# Patient Record
Sex: Female | Born: 1935 | Race: White | Hispanic: No
Health system: Southern US, Community
[De-identification: ages and names within clinical notes are randomized; demographics above are authoritative.]

## PROBLEM LIST (undated history)

## (undated) DIAGNOSIS — R4189 Other symptoms and signs involving cognitive functions and awareness: Secondary | ICD-10-CM

## (undated) DIAGNOSIS — F0390 Unspecified dementia without behavioral disturbance: Secondary | ICD-10-CM

## (undated) DIAGNOSIS — F32A Depression, unspecified: Secondary | ICD-10-CM

## (undated) DIAGNOSIS — E039 Hypothyroidism, unspecified: Secondary | ICD-10-CM

## (undated) DIAGNOSIS — G629 Polyneuropathy, unspecified: Secondary | ICD-10-CM

## (undated) DIAGNOSIS — I10 Essential (primary) hypertension: Secondary | ICD-10-CM

## (undated) DIAGNOSIS — M109 Gout, unspecified: Secondary | ICD-10-CM

## (undated) DIAGNOSIS — F329 Major depressive disorder, single episode, unspecified: Secondary | ICD-10-CM

## (undated) HISTORY — DX: Other symptoms and signs involving cognitive functions and awareness: R41.89

## (undated) HISTORY — DX: Polyneuropathy, unspecified: G62.9

## (undated) HISTORY — DX: Essential (primary) hypertension: I10

## (undated) HISTORY — DX: Depression, unspecified: F32.A

## (undated) HISTORY — DX: Hypothyroidism, unspecified: E03.9

## (undated) HISTORY — DX: Gout, unspecified: M10.9

---
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
## 2003-04-07 ENCOUNTER — Ambulatory Visit (HOSPITAL_COMMUNITY): Admission: RE | Admit: 2003-04-07 | Discharge: 2003-04-07 | Payer: Self-pay | Admitting: *Deleted

## 2006-03-30 ENCOUNTER — Encounter: Admission: RE | Admit: 2006-03-30 | Discharge: 2006-03-30 | Payer: Self-pay | Admitting: Urology

## 2006-03-30 IMAGING — CR DG CHEST 2V
2 series · 2 of 2 positions shown · non-contrast
Comparison: none

CLINICAL DATA: Pre-op for bladder tumor. 
 CHEST X-RAY: 
 Two views of the chest show the lungs to be clear.  The heart is within normal limits in size.  No bony abnormality is seen.

[w chest pa]
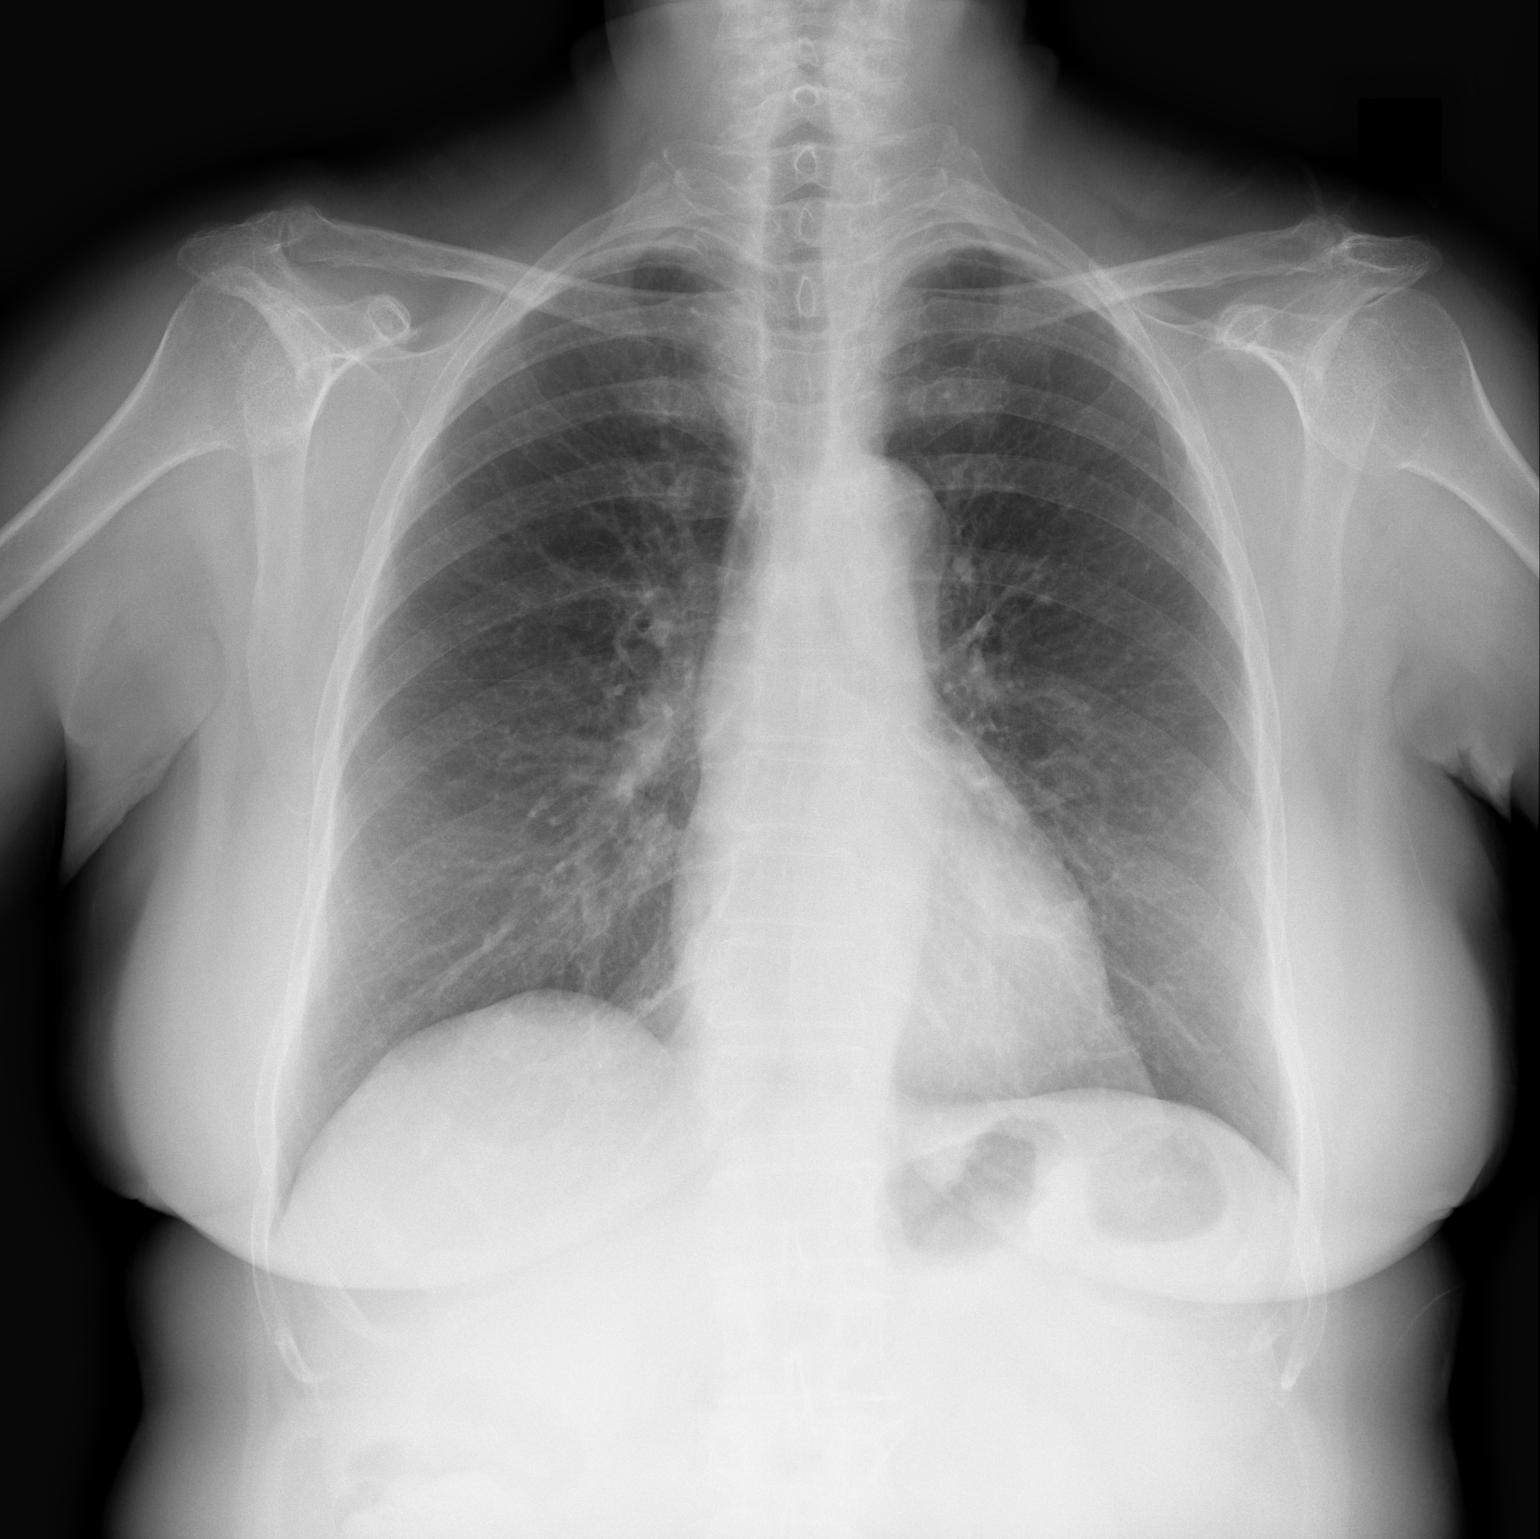

[w chest lat]
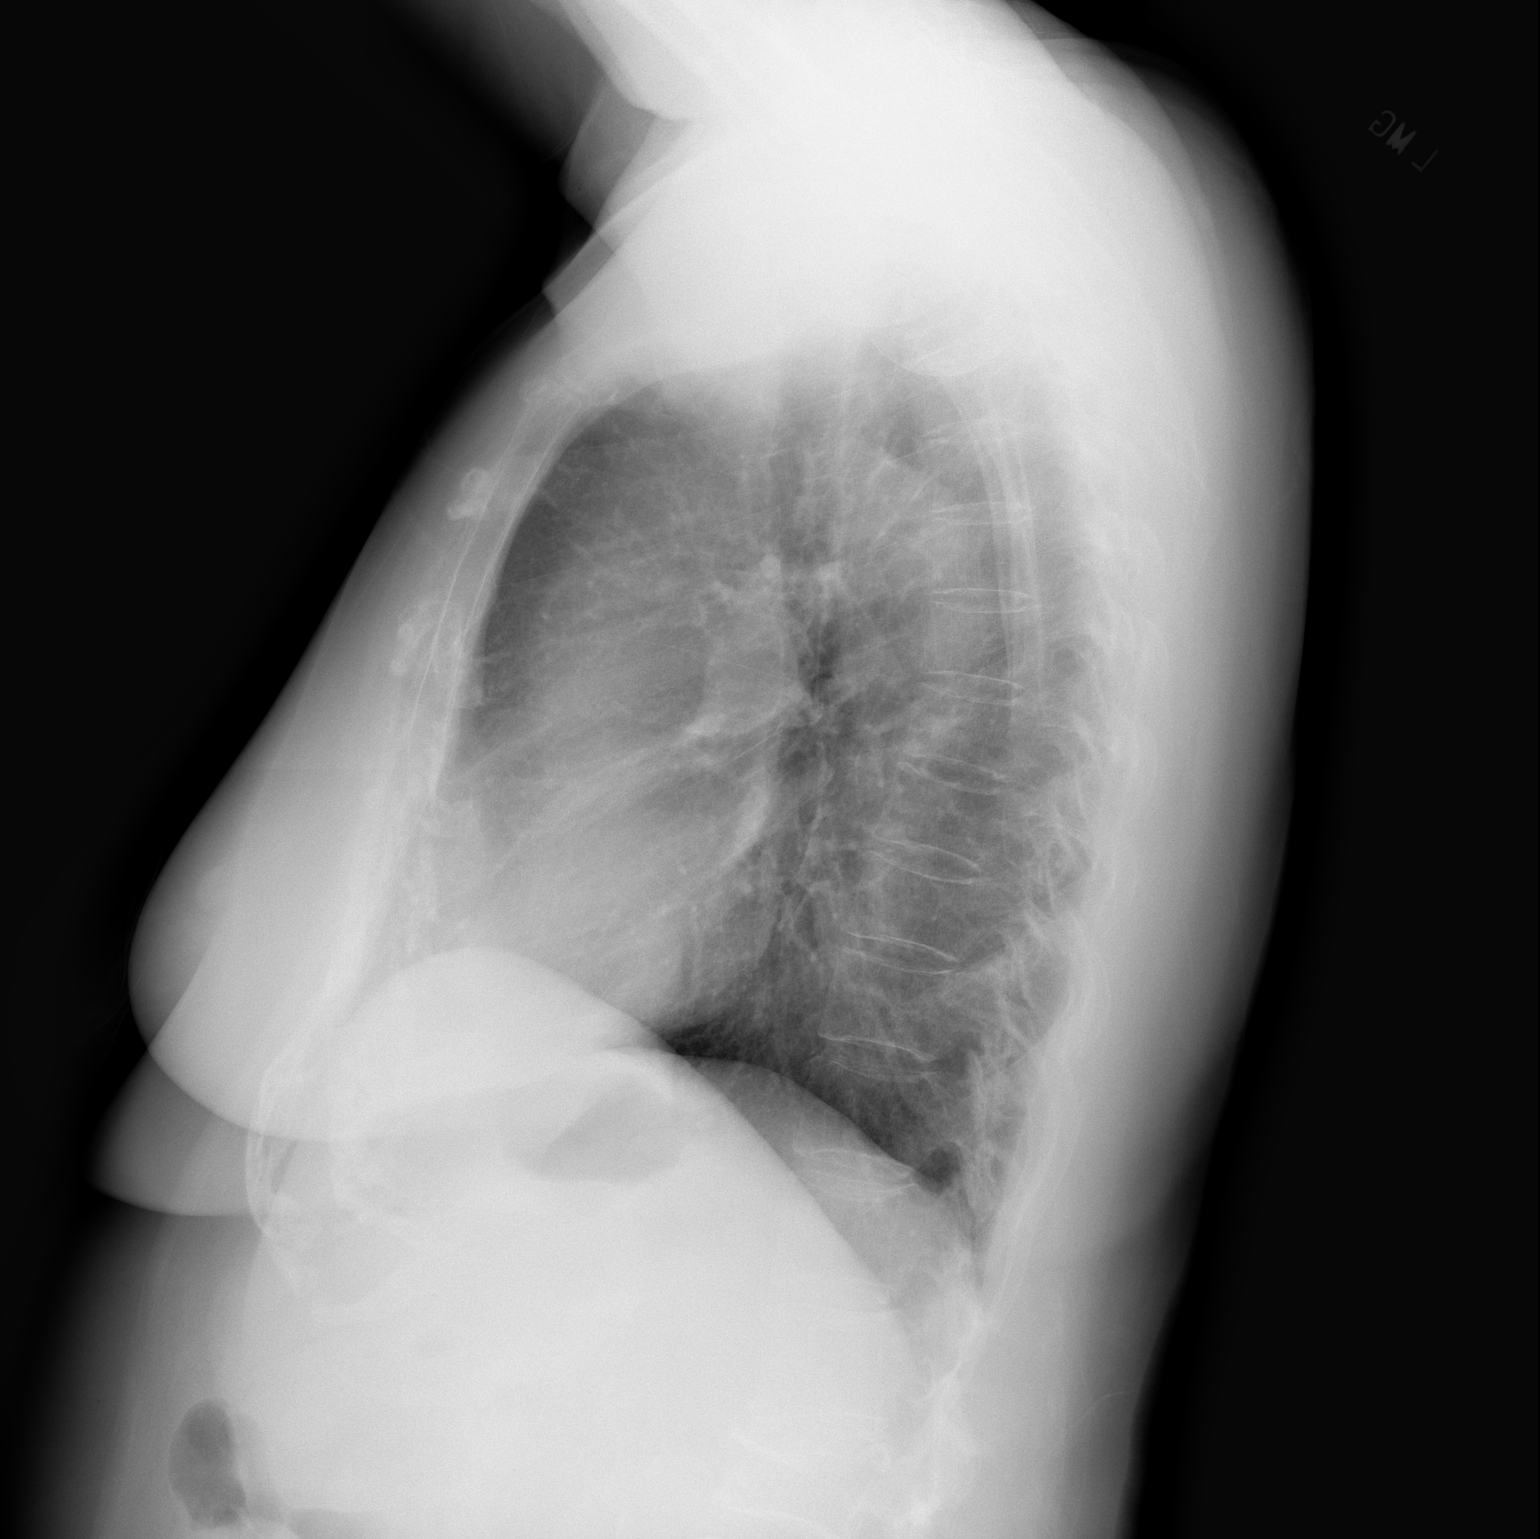

[2 of 2 positions shown; findings below may reference images not displayed]

IMPRESSION: No active lung disease.

## 2006-03-31 ENCOUNTER — Ambulatory Visit (HOSPITAL_BASED_OUTPATIENT_CLINIC_OR_DEPARTMENT_OTHER): Admission: RE | Admit: 2006-03-31 | Discharge: 2006-03-31 | Payer: Self-pay | Admitting: Urology

## 2006-03-31 ENCOUNTER — Encounter (INDEPENDENT_AMBULATORY_CARE_PROVIDER_SITE_OTHER): Payer: Self-pay | Admitting: Specialist

## 2008-06-07 ENCOUNTER — Inpatient Hospital Stay (HOSPITAL_COMMUNITY): Admission: EM | Admit: 2008-06-07 | Discharge: 2008-06-08 | Payer: Self-pay | Admitting: Emergency Medicine

## 2008-06-07 IMAGING — CR DG CHEST 1V PORT
1 series · 1 of 1 positions shown · non-contrast
Comparison: [DATE]

CLINICAL DATA: Chest pain.

PORTABLE CHEST - 1 VIEW

[view not recorded]
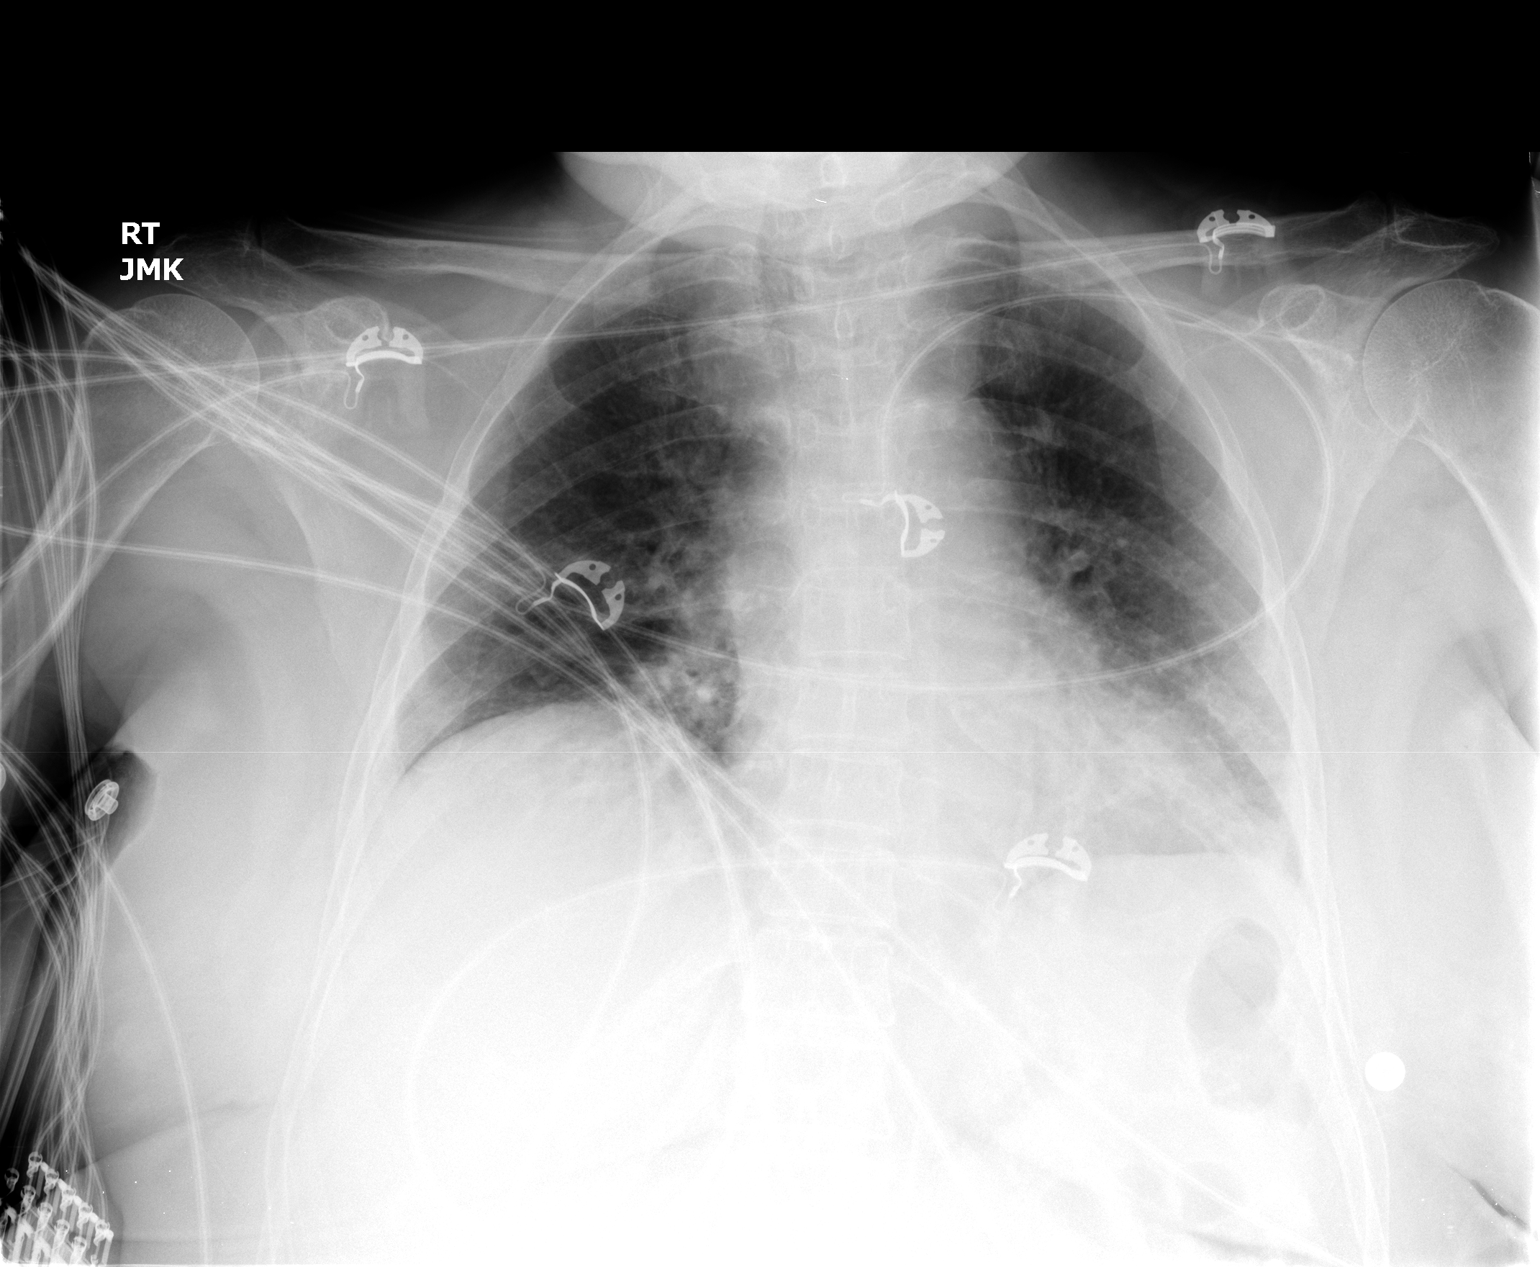

[1 of 1 positions shown; findings below may reference images not displayed]

FINDINGS: Heart size normal.  No CHF or acute pulmonary process.
Bronchitic markings may be accentuated possibly due to chronic
bronchitic disease.
IMPRESSION: Probable chronic bronchitic changes.  No acute findings.

## 2008-06-07 IMAGING — CT CT ANGIO CHEST
2 of 7 series · 19 of 36 positions shown · IV contrast (APPLIED)
Comparison: Chest radiograph today.

CLINICAL DATA: Chest pain.  Query PE.  Elevated D-dimer.
Hypertension.

CT ANGIOGRAPHY CHEST
TECHNIQUE: Multidetector CT imaging of the chest was performed
using the standard protocol during bolus administration of
intravenous contrast. Multiplanar CT image reconstructions
including MIPs were obtained to evaluate the vascular anatomy.
Contrast: 100 ml [HR] IV

[Series 8: pulm embolism 1.0 b25f thins · axial · 0.59mm/px · z∈[+566,+742]mm · 18 of 199 slices shown]
[im 11/199  lung]
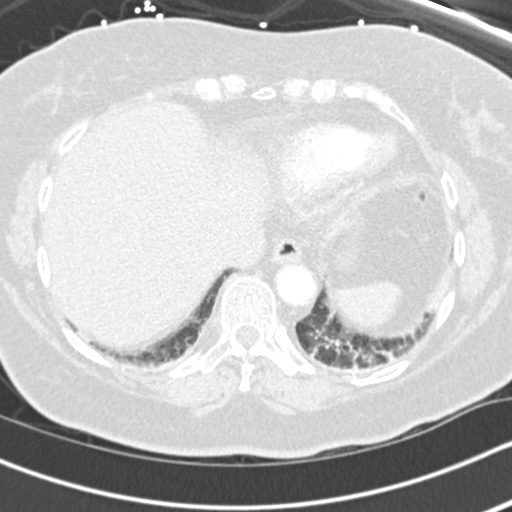
[im 21/199  mediastinal]
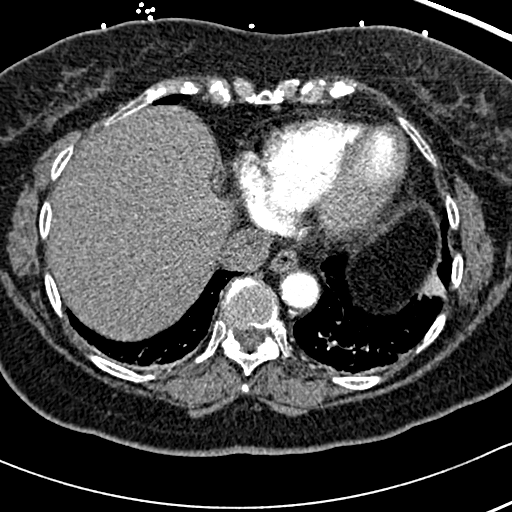
[im 32/199  lung]
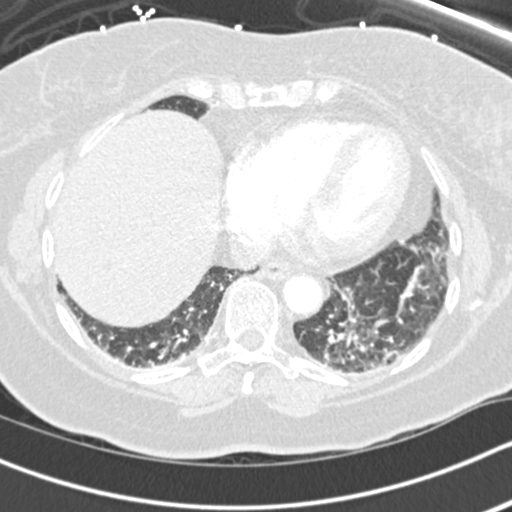
[im 42/199  mediastinal]
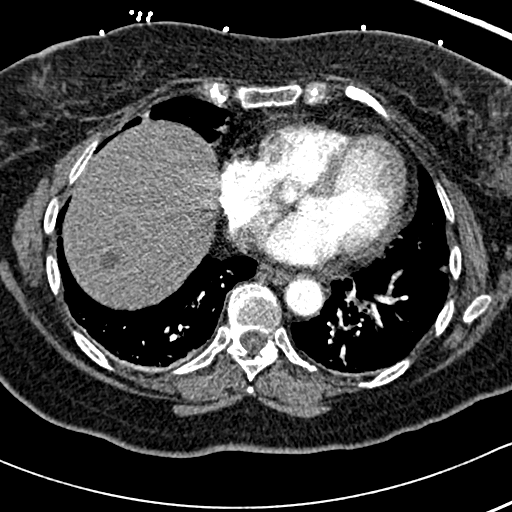
[im 53/199  lung]
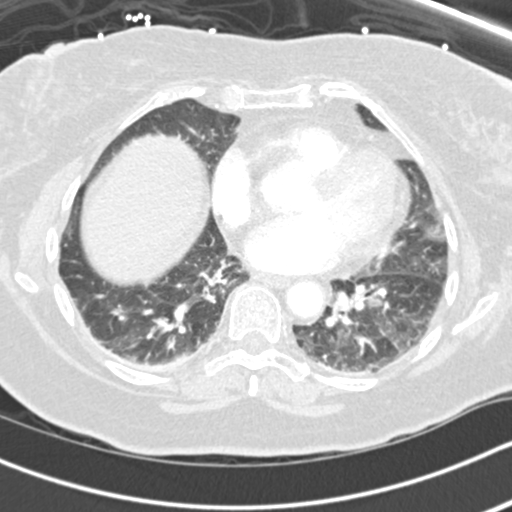
[im 63/199  mediastinal]
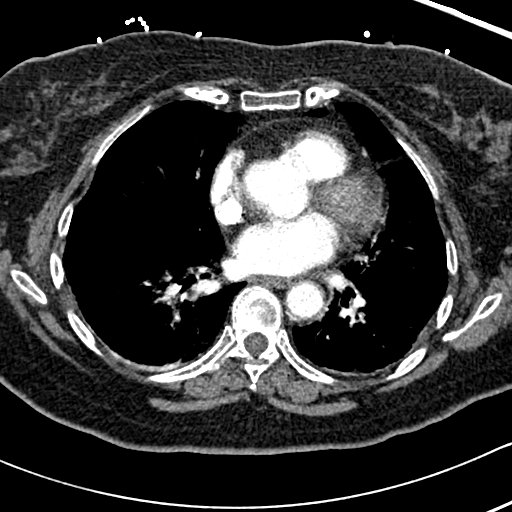
[im 73/199  lung]
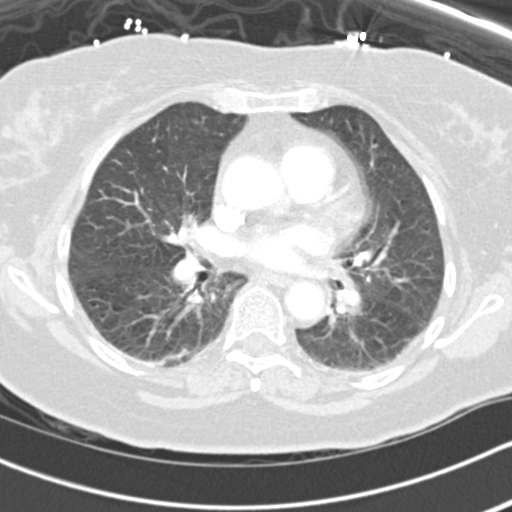
[im 84/199  mediastinal]
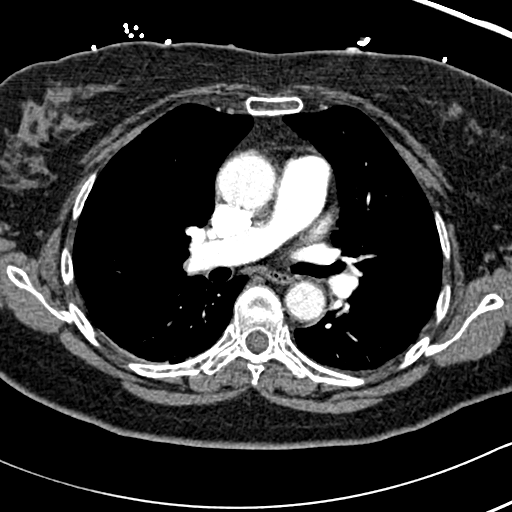
[im 94/199  lung]
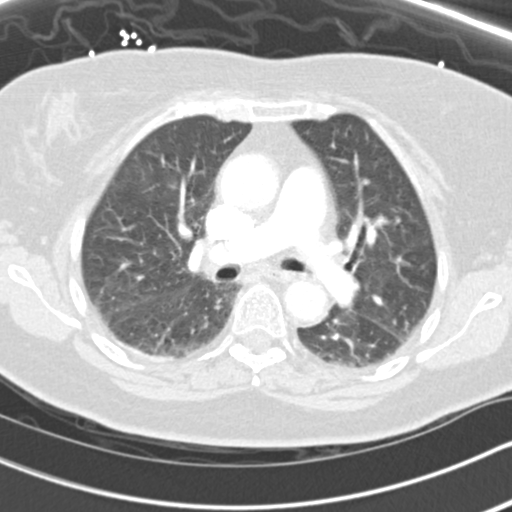
[im 105/199  mediastinal]
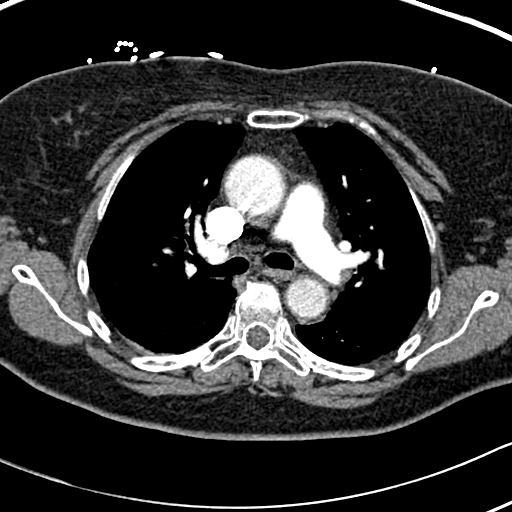
[im 115/199  lung]
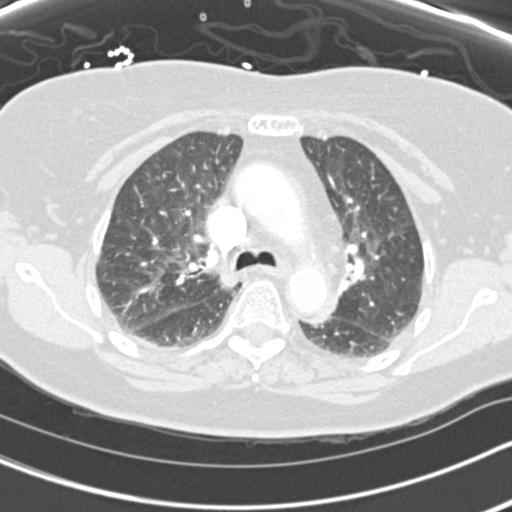
[im 126/199  mediastinal]
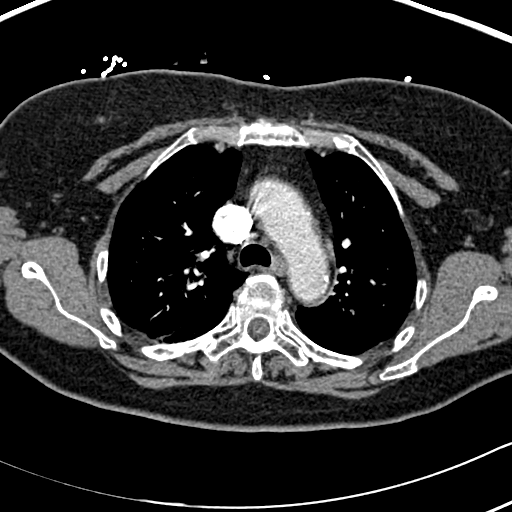
[im 136/199  lung]
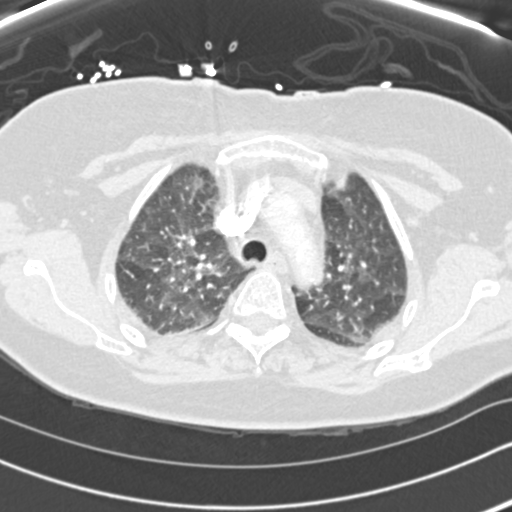
[im 146/199  mediastinal]
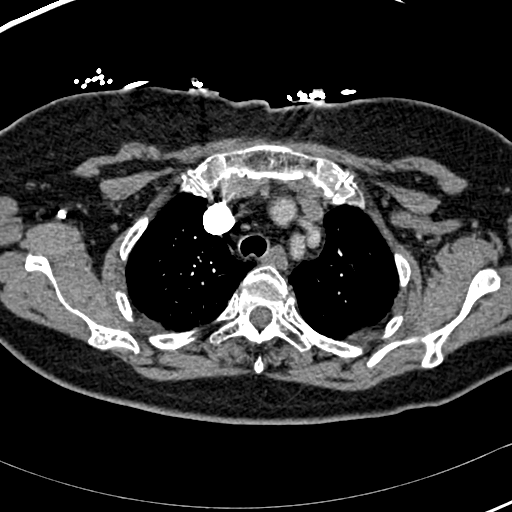
[im 157/199  lung]
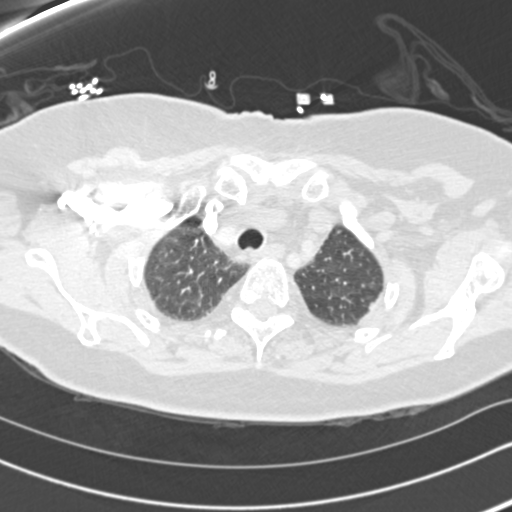
[im 167/199  mediastinal]
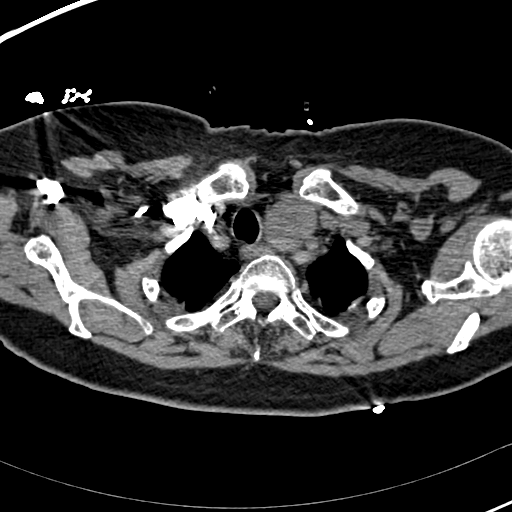
[im 178/199  lung]
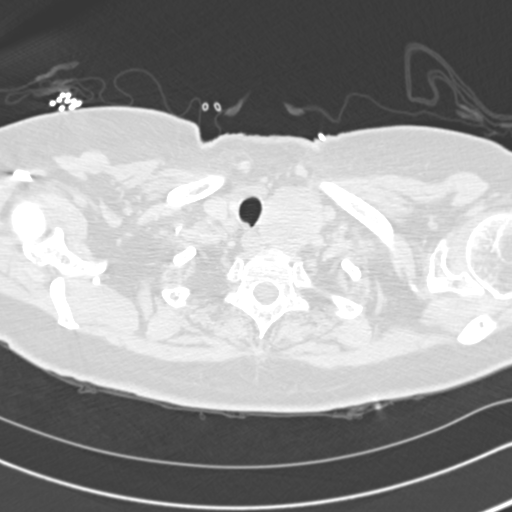
[im 188/199  mediastinal]
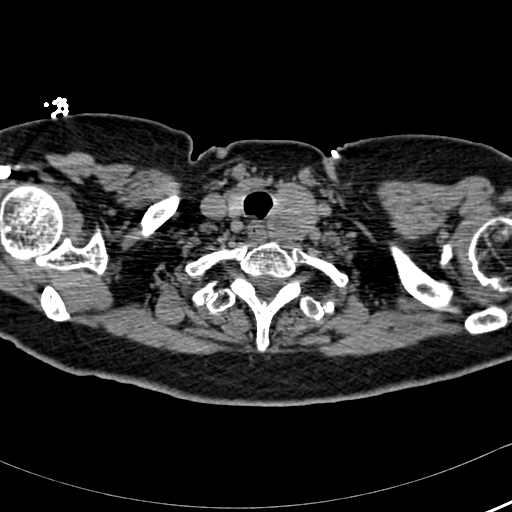

[Series 602: coronal · coronal · 0.59mm/px · 1 of 56 slices shown]
[im 28/56  mediastinal]
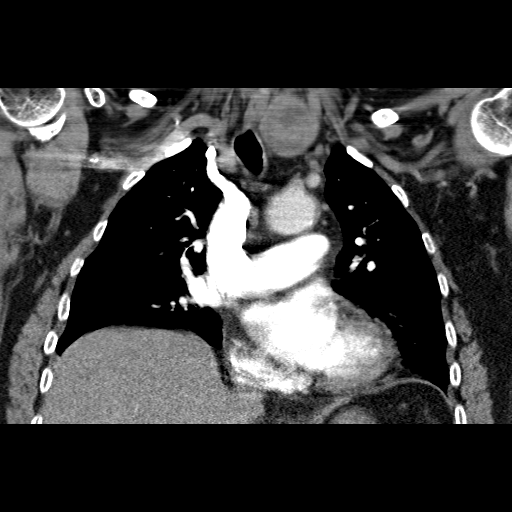

[19 of 36 positions shown; findings below may reference images not displayed]

FINDINGS: There is a 3.5 x 3.6 cm solid mass which arises from the
inferior pole of the left thyroid lobe and extends into the
superior mediastinum displacing the trachea to the right.  This is
compatible with a large thyroid mass.  Thyroid ultrasound
recommended for further evaluation.  Mass may need to be biopsied.

Negative for PE.  No acute mediastinal abnormality.  Chronic
bronchitic changes.  No pericardial or pleural effusions.  Moderate
prominent caliber of the central pulmonary arteries may reflect an
element of PAH.  Mild hazy ground-glass attenuation of the lungs
having somewhat of a mosaic pattern consistent with small airways
disease.

 Review of the MIP images confirms the above findings.
IMPRESSION: Left thyroid mass - recommend thyroid ultrasound.  Negative for PE.
Mosaic attenuation of the lungs compatible with a small airways
disease.  No acute infiltrate.  Prominent pulmonary arteries may
reflect PAH.  Chronic bronchitic changes.

## 2008-06-08 ENCOUNTER — Encounter (INDEPENDENT_AMBULATORY_CARE_PROVIDER_SITE_OTHER): Payer: Self-pay | Admitting: Internal Medicine

## 2008-06-08 IMAGING — US US SOFT TISSUE HEAD/NECK
1 series · 14 of 25 positions shown · non-contrast
Comparison: CT [DATE]

CLINICAL DATA: Chest pain.  Thyroid mass identified on recent CT
chest

THYROID ULTRASOUND
TECHNIQUE: Ultrasound examination of the thyroid gland and
adjacent soft tissues was performed.

[Series 1: us soft tissue head/neck · 0.10mm/px · 14 of 37 slices shown]
[im 1/37]
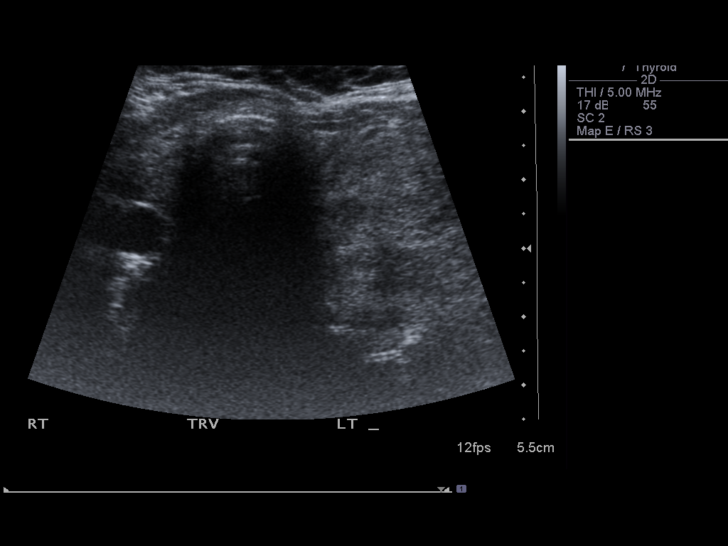
[im 4/37]
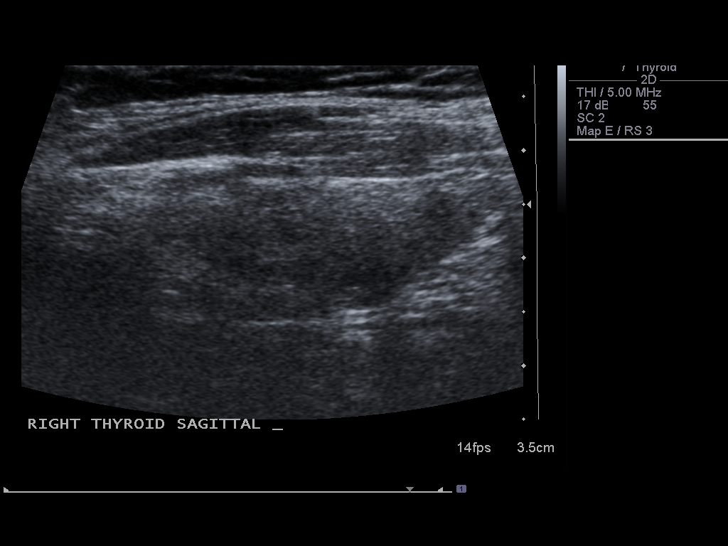
[im 7/37]
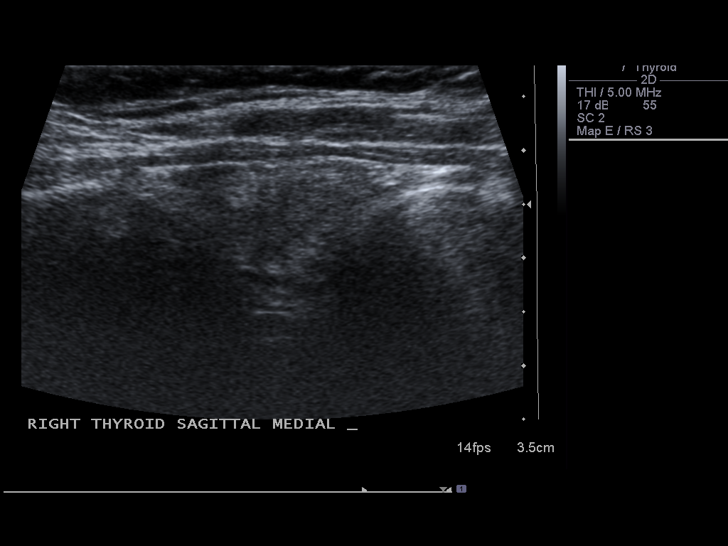
[im 10/37]
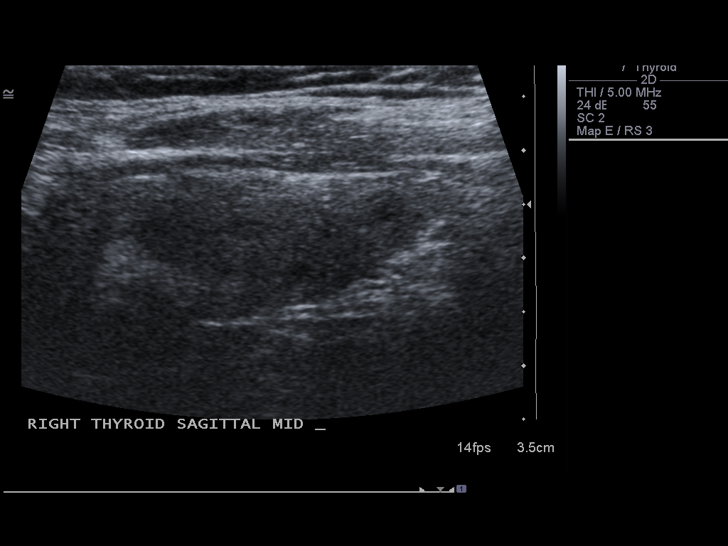
[im 13/37]
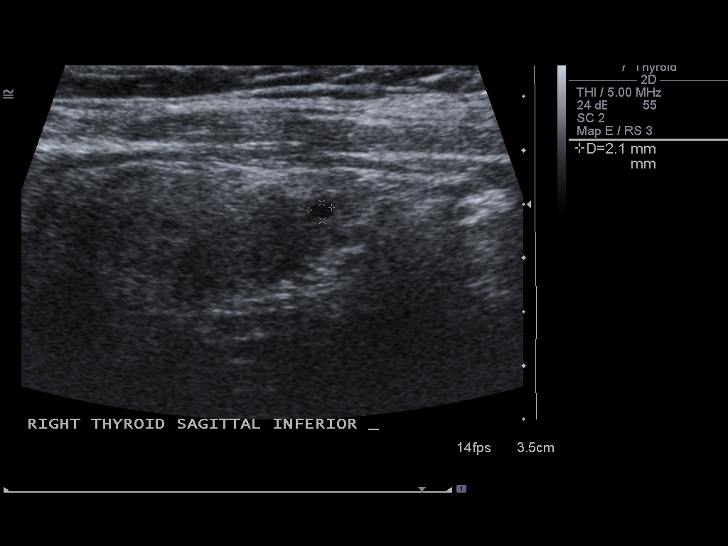
[im 14/37]
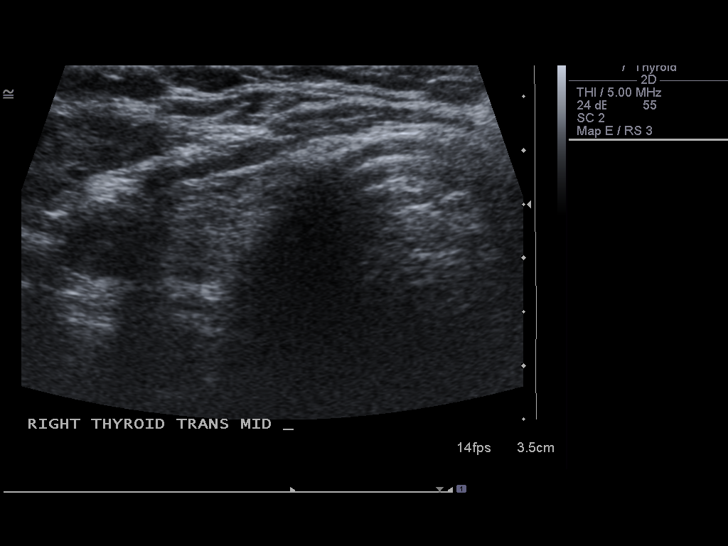
[im 17/37]
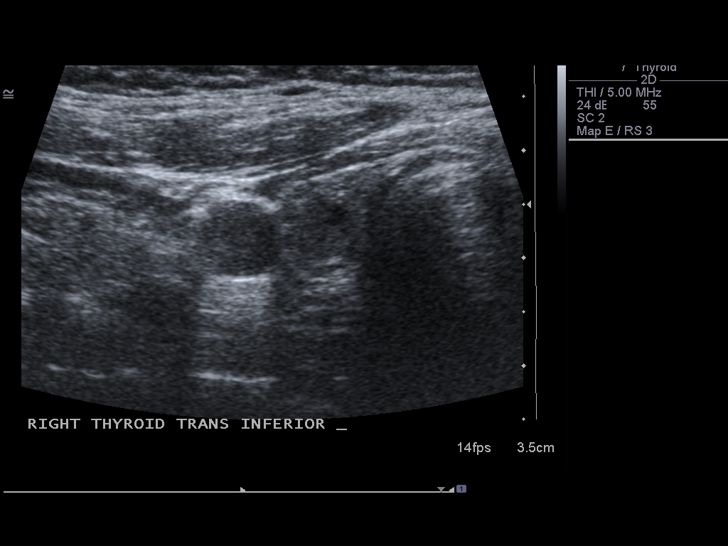
[im 20/37]
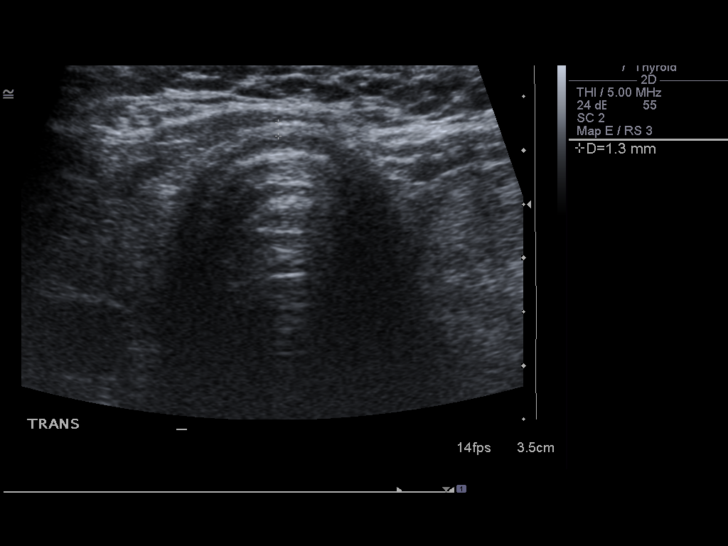
[im 23/37]
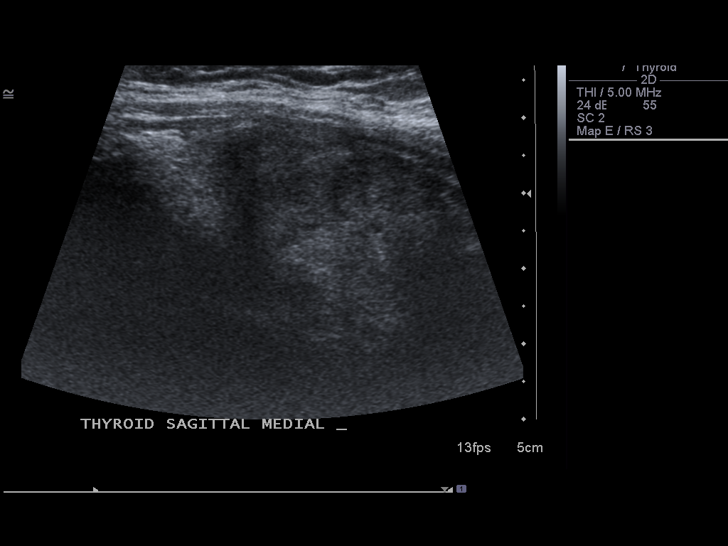
[im 25/37]
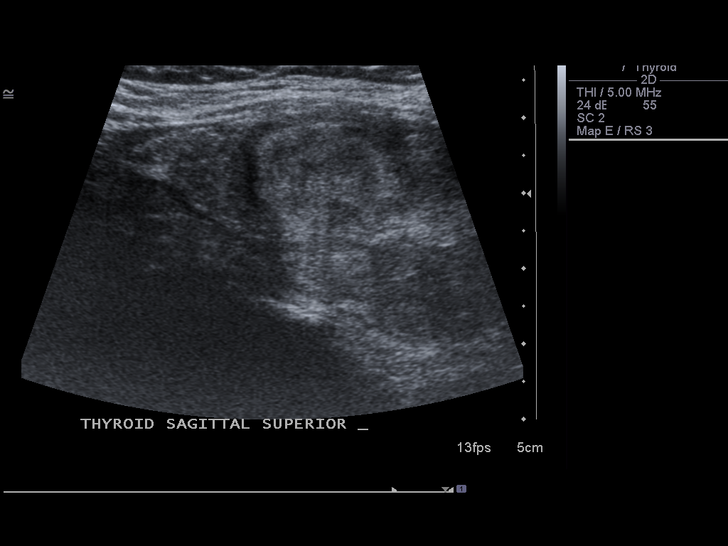
[im 28/37]
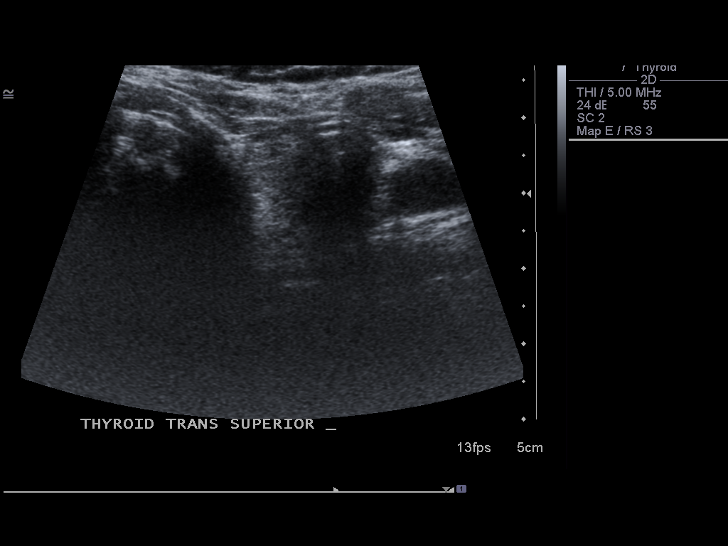
[im 31/37]
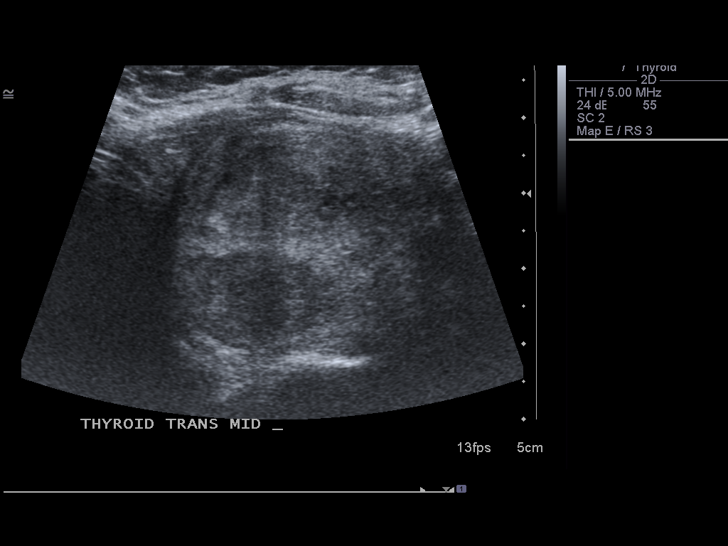
[im 34/37]
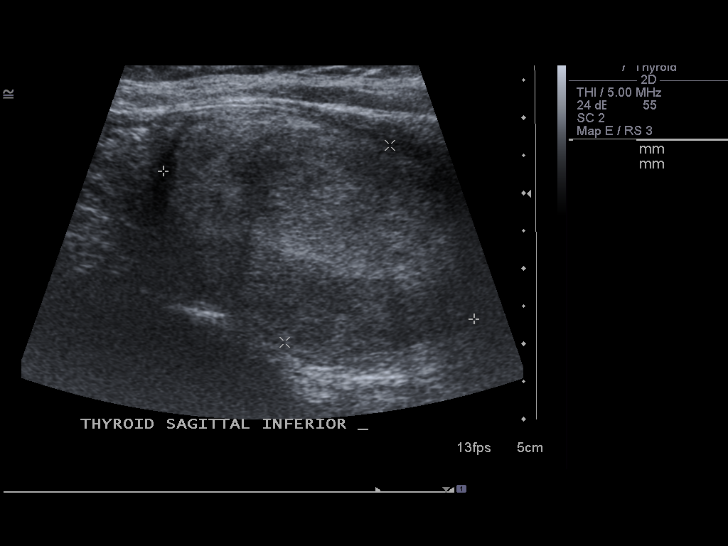
[im 37/37]
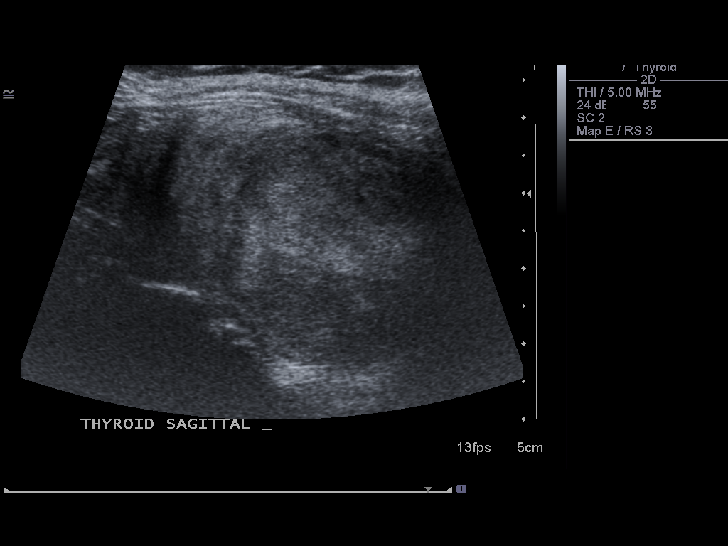

[14 of 25 positions shown; findings below may reference images not displayed]

FINDINGS: The right lobe measures 11 x 11 x 34 mm.  There is a 2 mm
cyst in the inferior pole.  The isthmus measures only 1 mm in
thickness.
The left lobe measures 30 x 37 x 55 mm.  There is a dominant solid
30 x 33 x 46 mm nodule in the inferior pole.
IMPRESSION: 1.  Solitary dominant left thyroid mass.  Consider aspiration
biopsy to exclude neoplasm.

## 2008-06-21 ENCOUNTER — Encounter: Admission: RE | Admit: 2008-06-21 | Discharge: 2008-06-21 | Payer: Self-pay | Admitting: Internal Medicine

## 2008-06-21 ENCOUNTER — Other Ambulatory Visit: Admission: RE | Admit: 2008-06-21 | Discharge: 2008-06-21 | Payer: Self-pay | Admitting: Interventional Radiology

## 2008-06-21 ENCOUNTER — Encounter (INDEPENDENT_AMBULATORY_CARE_PROVIDER_SITE_OTHER): Payer: Self-pay | Admitting: Interventional Radiology

## 2008-06-21 IMAGING — US US BIOPSY
1 series · 9 of 9 positions shown · non-contrast
Comparison: none

Clinical Data/Indication: Dominant left thyroid mass

ULTRASOUND-GUIDED BIOPSYOF A DOMINANT LEFT THYROID MASS.  FINE
NEEDLE ASPIRATION.
Procedure: The procedure, risks, benefits, and alternatives were
explained to the patient. Questions regarding the procedure were
encouraged and answered. The patient understands and consents to
the procedure.
The neck was prepped withbetadine in a sterile fasion, and a
sterile drape was applied covering the operative field. A sterile
gown and sterile gloves were used for the procedure.
Under sonographic guidance, three 25 gauge fine needle aspirates of
the dominant left thyroid mass were obtained. The guide needle was
removed. Final imaging was performed.

[Series 1: us biopsy · 0.10mm/px · 9 acquisitions, 9 frames shown]
[im 1/9]
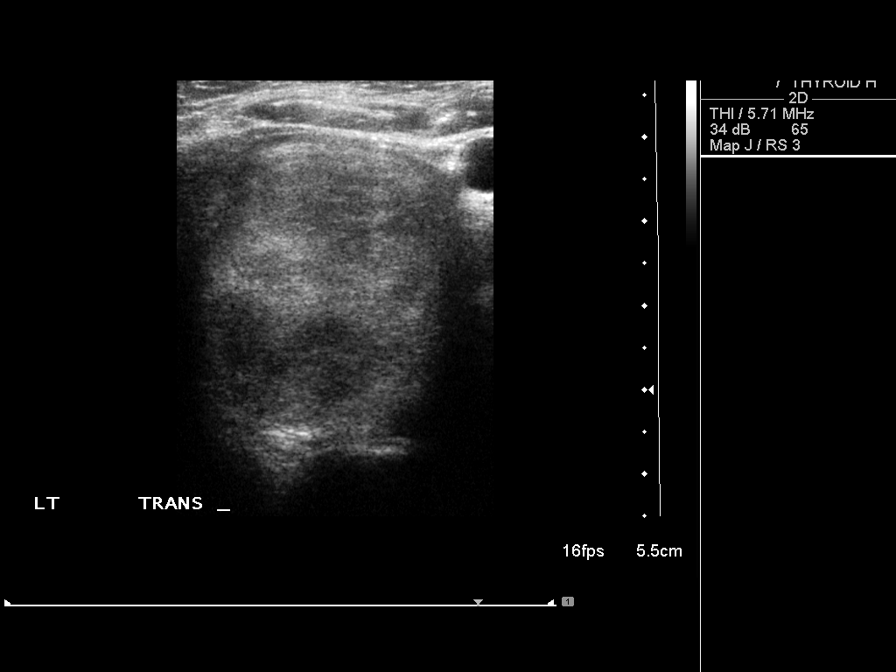
[im 2/9]
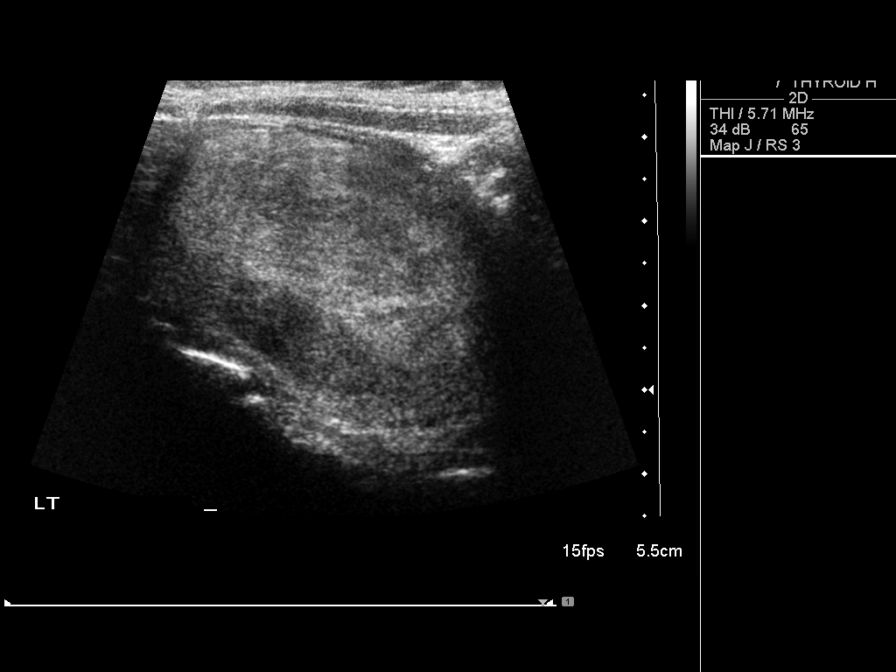
[im 3/9]
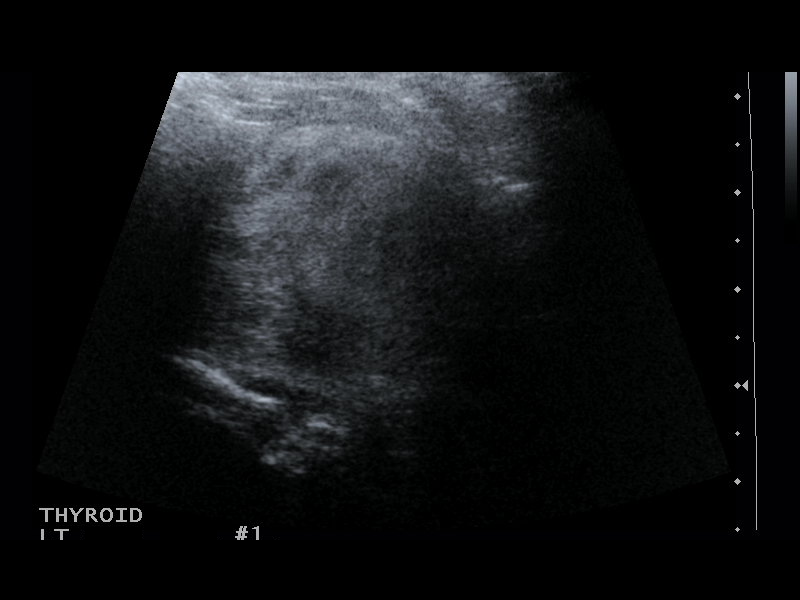
[im 4/9]
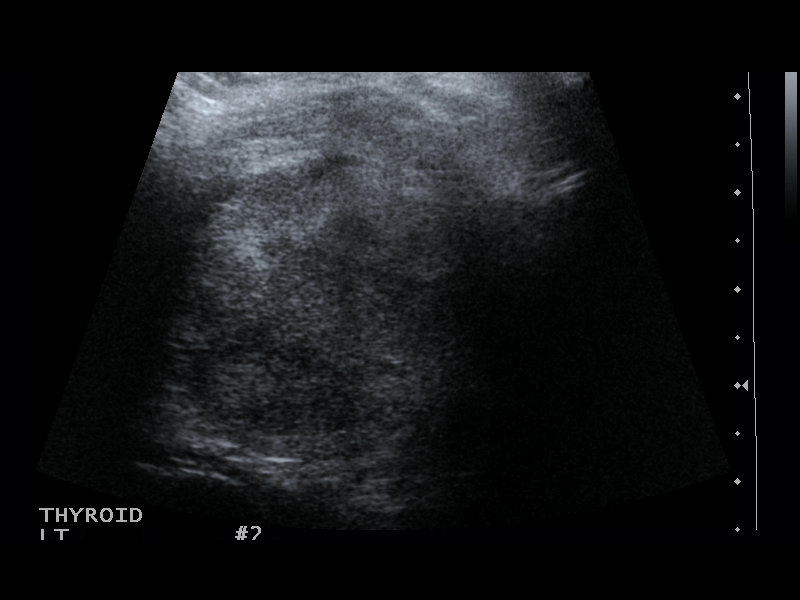
[im 5/9]
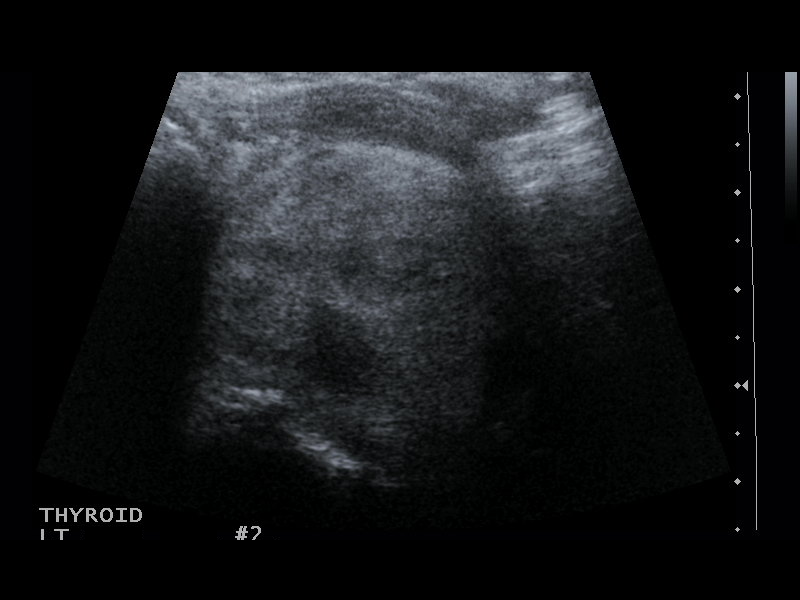
[im 6/9]
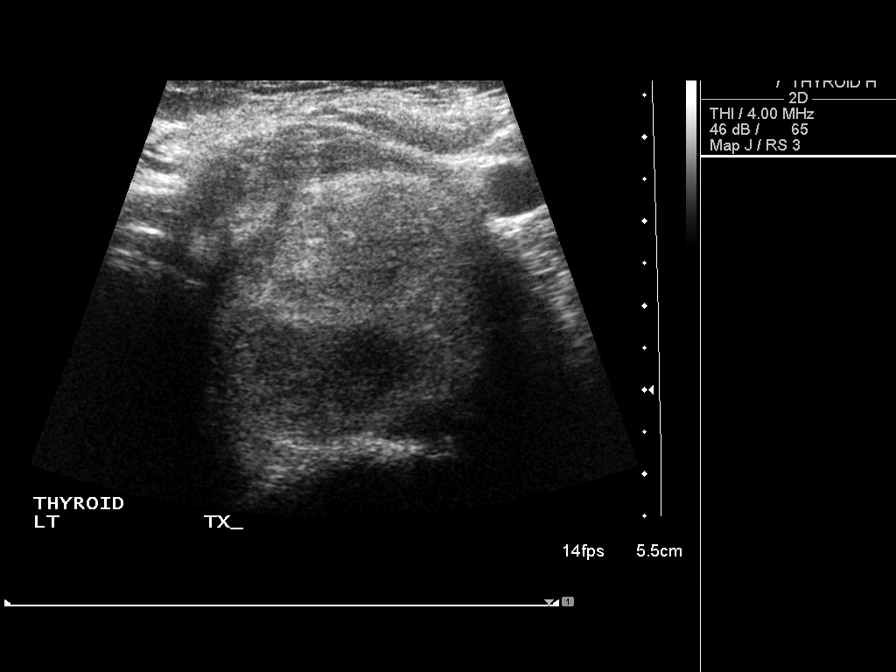
[im 7/9]
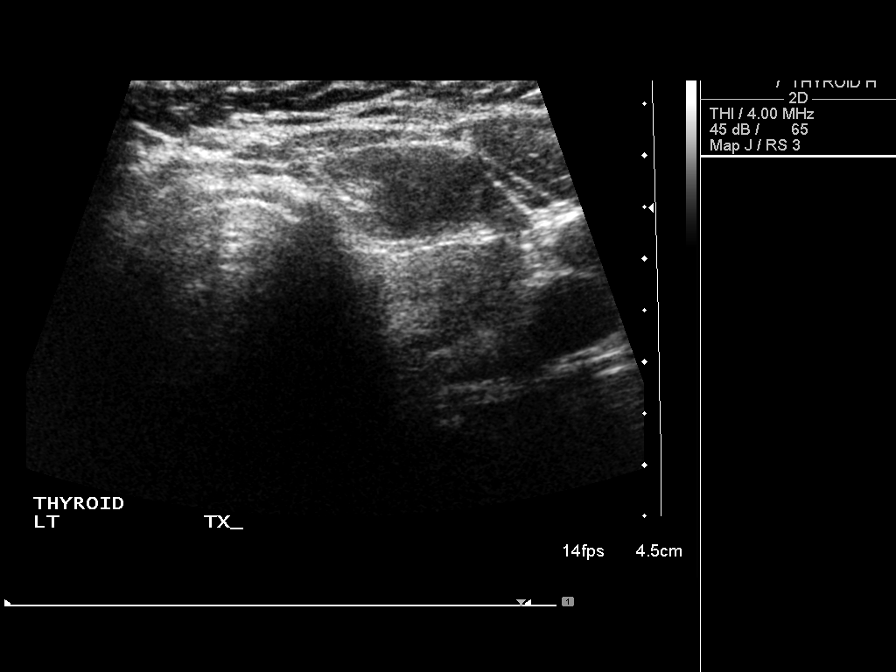
[im 8/9]
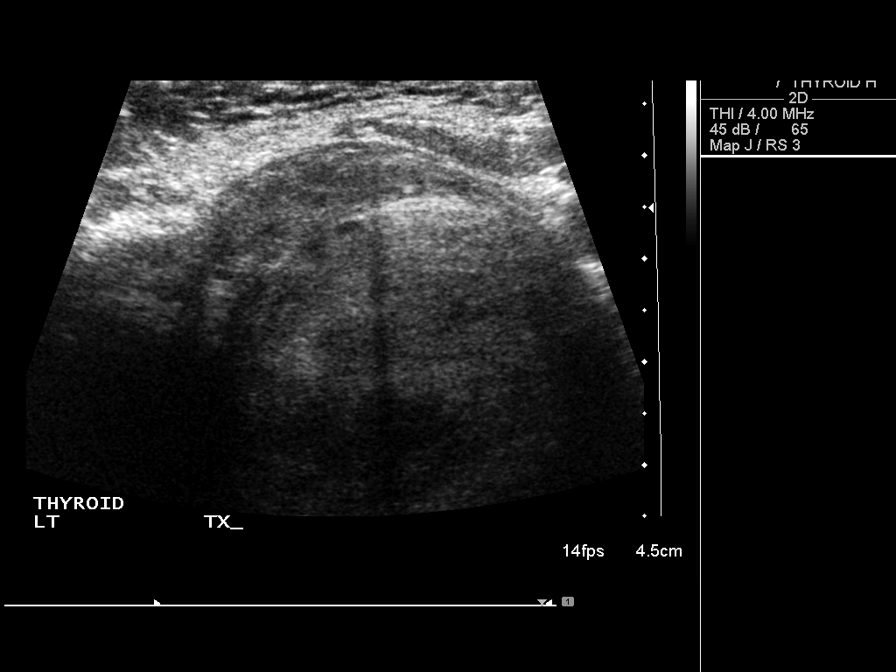
[im 9/9]
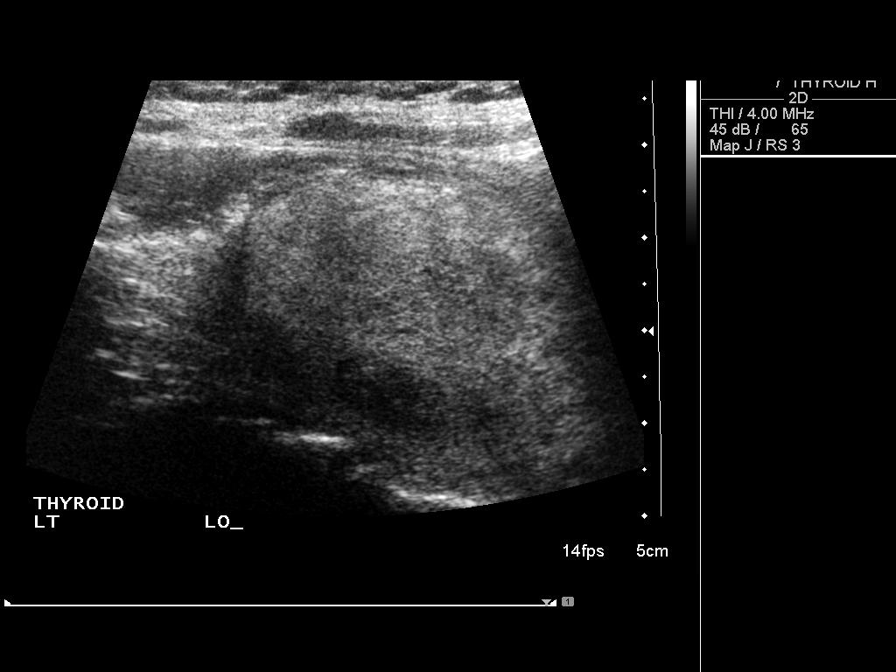

[9 of 9 positions shown; findings below may reference images not displayed]

FINDINGS: The images document guide needle placement within the
left thyroid mass. Post biopsy images demonstrate no hemorrhage.
IMPRESSION: Successful ultrasound-guided fine needle aspiration of a dominant
left thyroid mass.

## 2008-07-13 IMAGING — CR DG CHEST 2V
2 series · 2 of 2 positions shown · non-contrast
Comparison: [DATE]

CLINICAL DATA: Preoperative respiratory exam for thyroid surgery.
Hypertension.

CHEST - 2 VIEW

[view not recorded (1 of 2)]
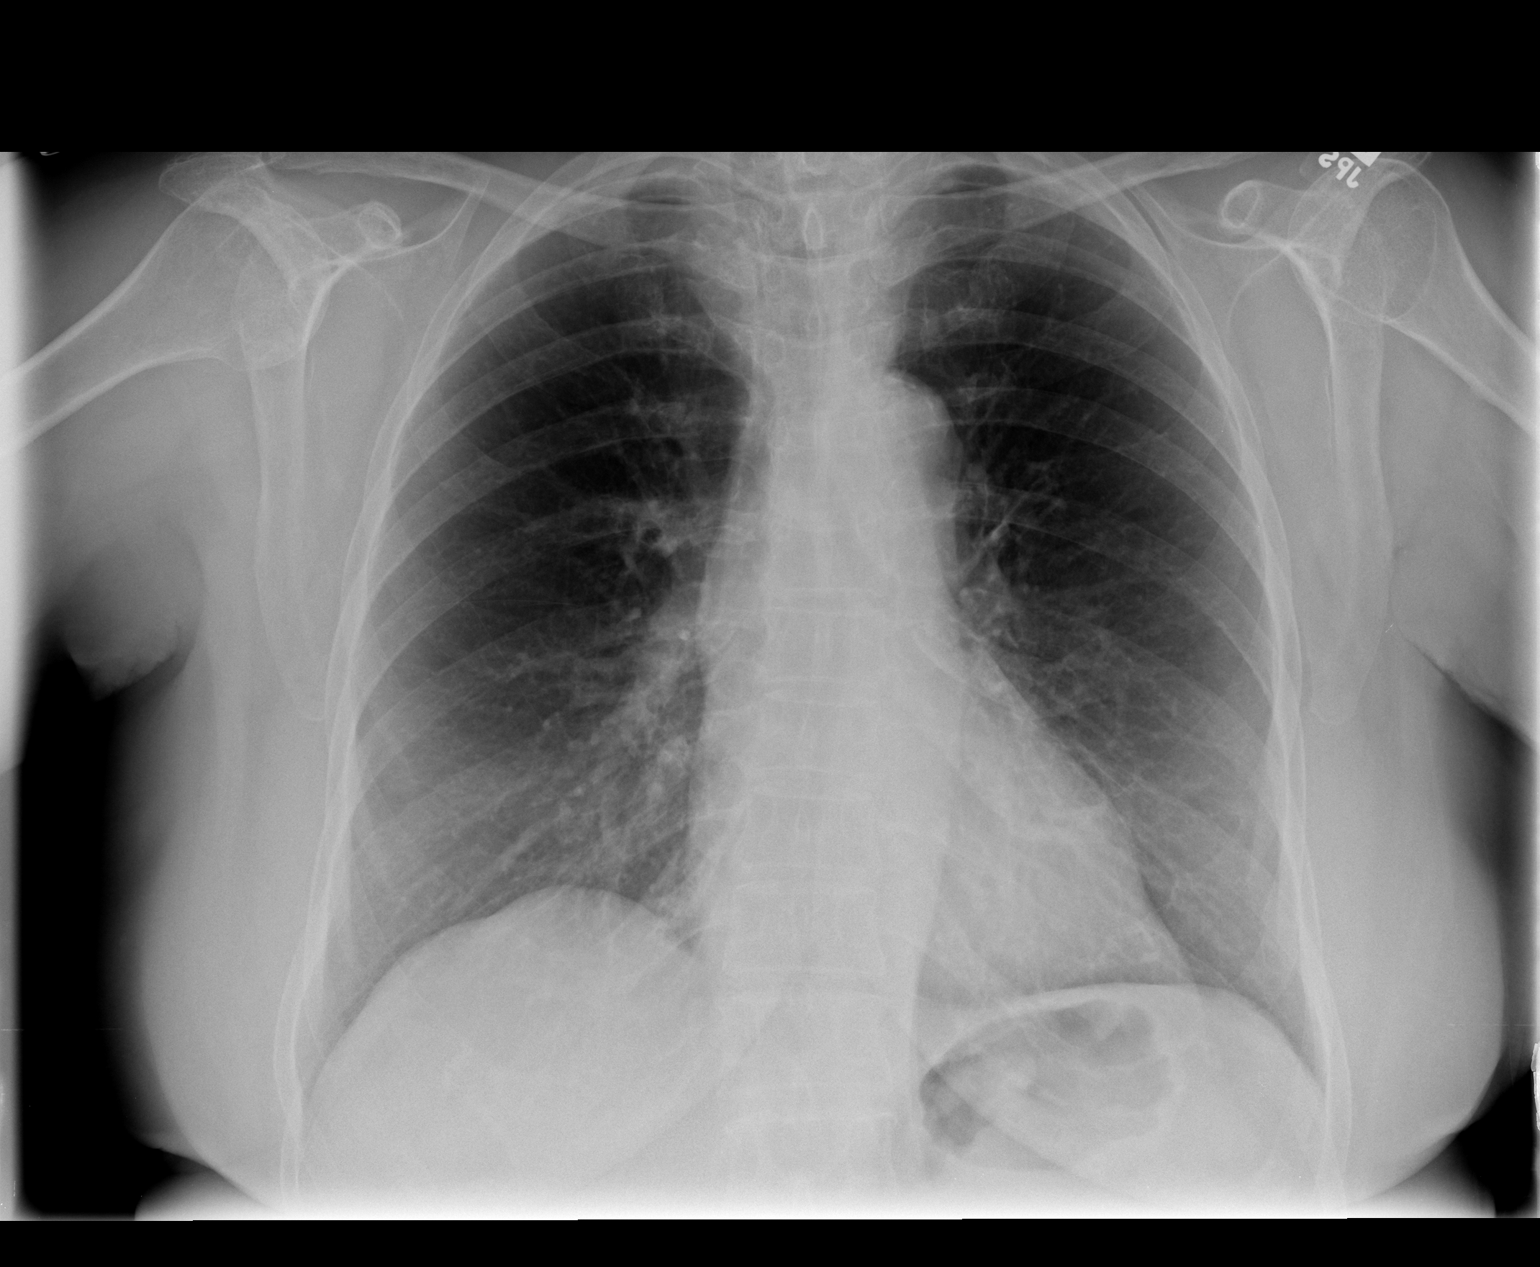

[view not recorded (2 of 2)]
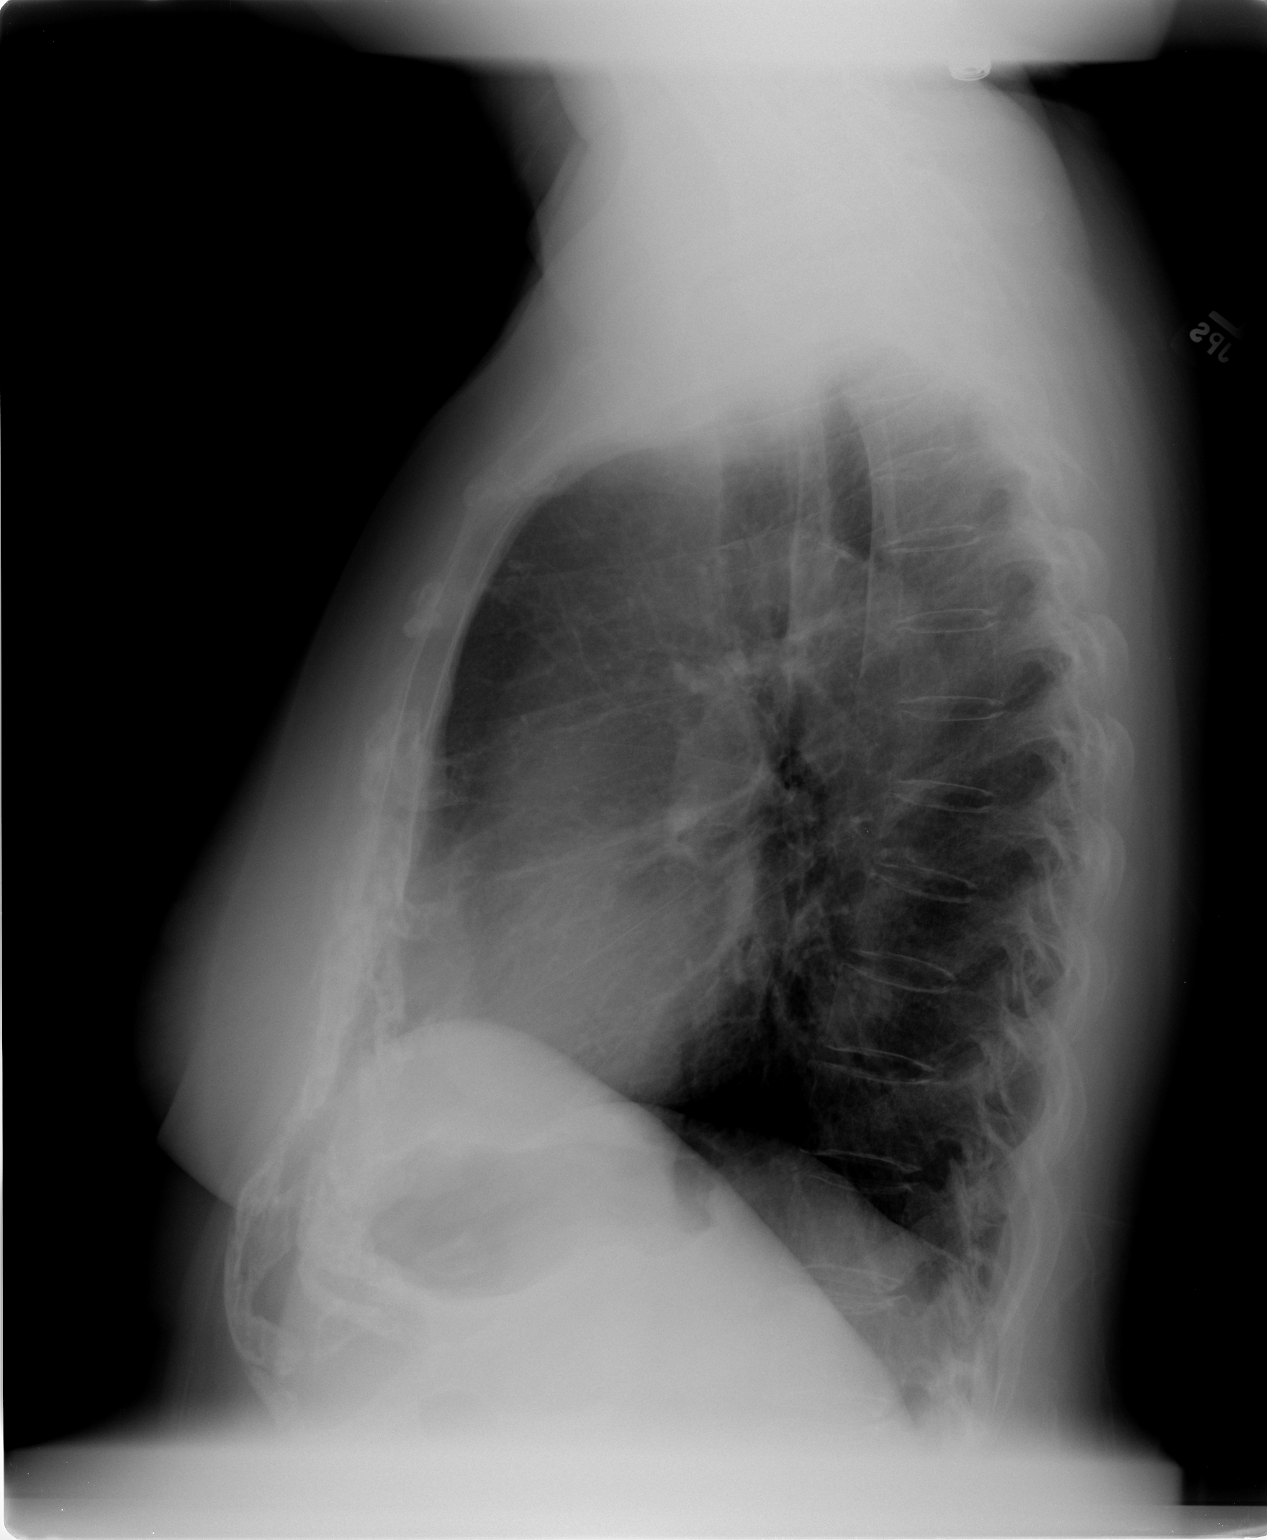

[2 of 2 positions shown; findings below may reference images not displayed]

FINDINGS: Heart size is normal.  Prominence of the superior
mediastinum relates to intrathoracic extension of thyroid tissue.
This appears the same.  No pulmonary parenchymal pathology is seen.
No effusions.  Bony structures are unremarkable.
IMPRESSION: No active cardiopulmonary disease.  Prominence of the superior
mediastinum, particularly on the left, related to intrathoracic
extension of thyroid tissue.

## 2008-07-17 ENCOUNTER — Ambulatory Visit (HOSPITAL_COMMUNITY): Admission: RE | Admit: 2008-07-17 | Discharge: 2008-07-18 | Payer: Self-pay | Admitting: Surgery

## 2008-07-17 ENCOUNTER — Encounter (INDEPENDENT_AMBULATORY_CARE_PROVIDER_SITE_OTHER): Payer: Self-pay | Admitting: Surgery

## 2009-11-21 IMAGING — CR DG CHEST 2V
2 series · 2 of 2 positions shown · non-contrast
Comparison: [DATE]

CLINICAL DATA: Preoperative respiratory examination for bladder
surgery.

CHEST - 2 VIEW

[w chest pa]
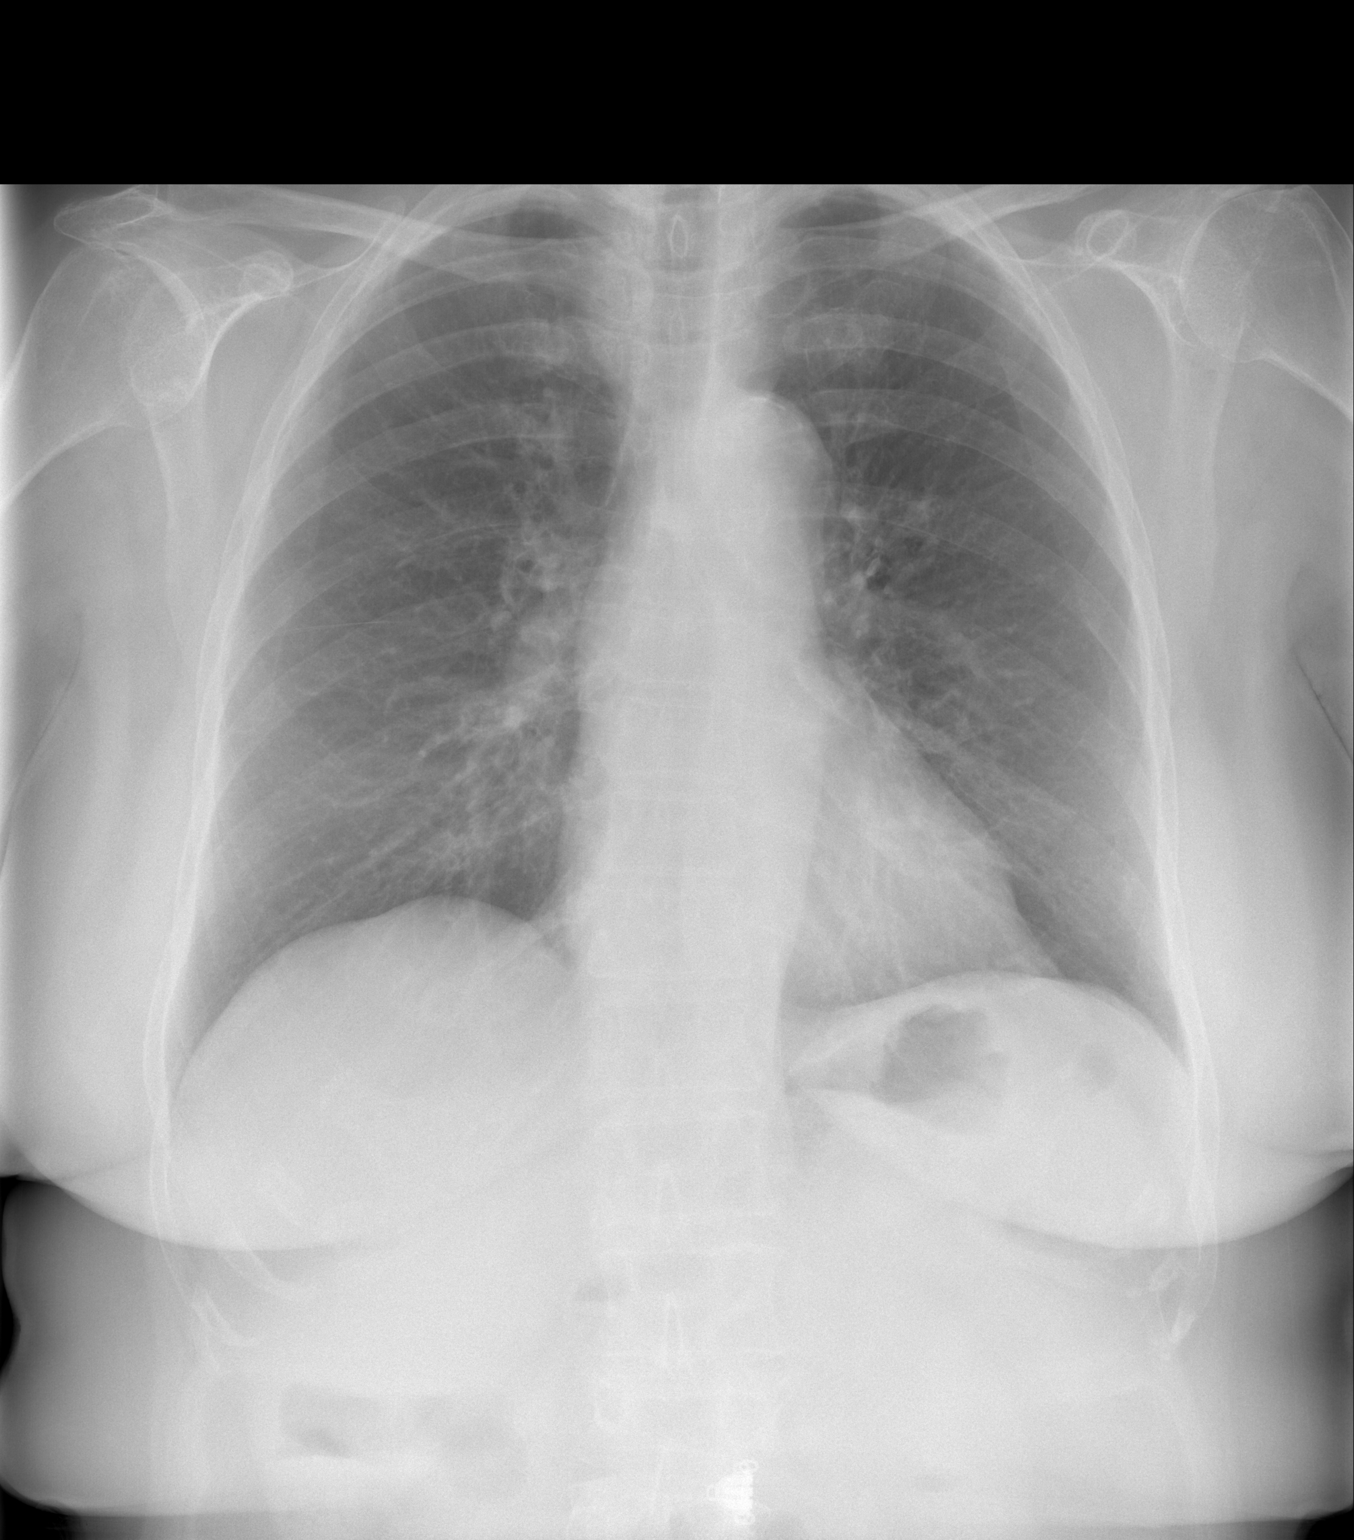

[w chest lat]
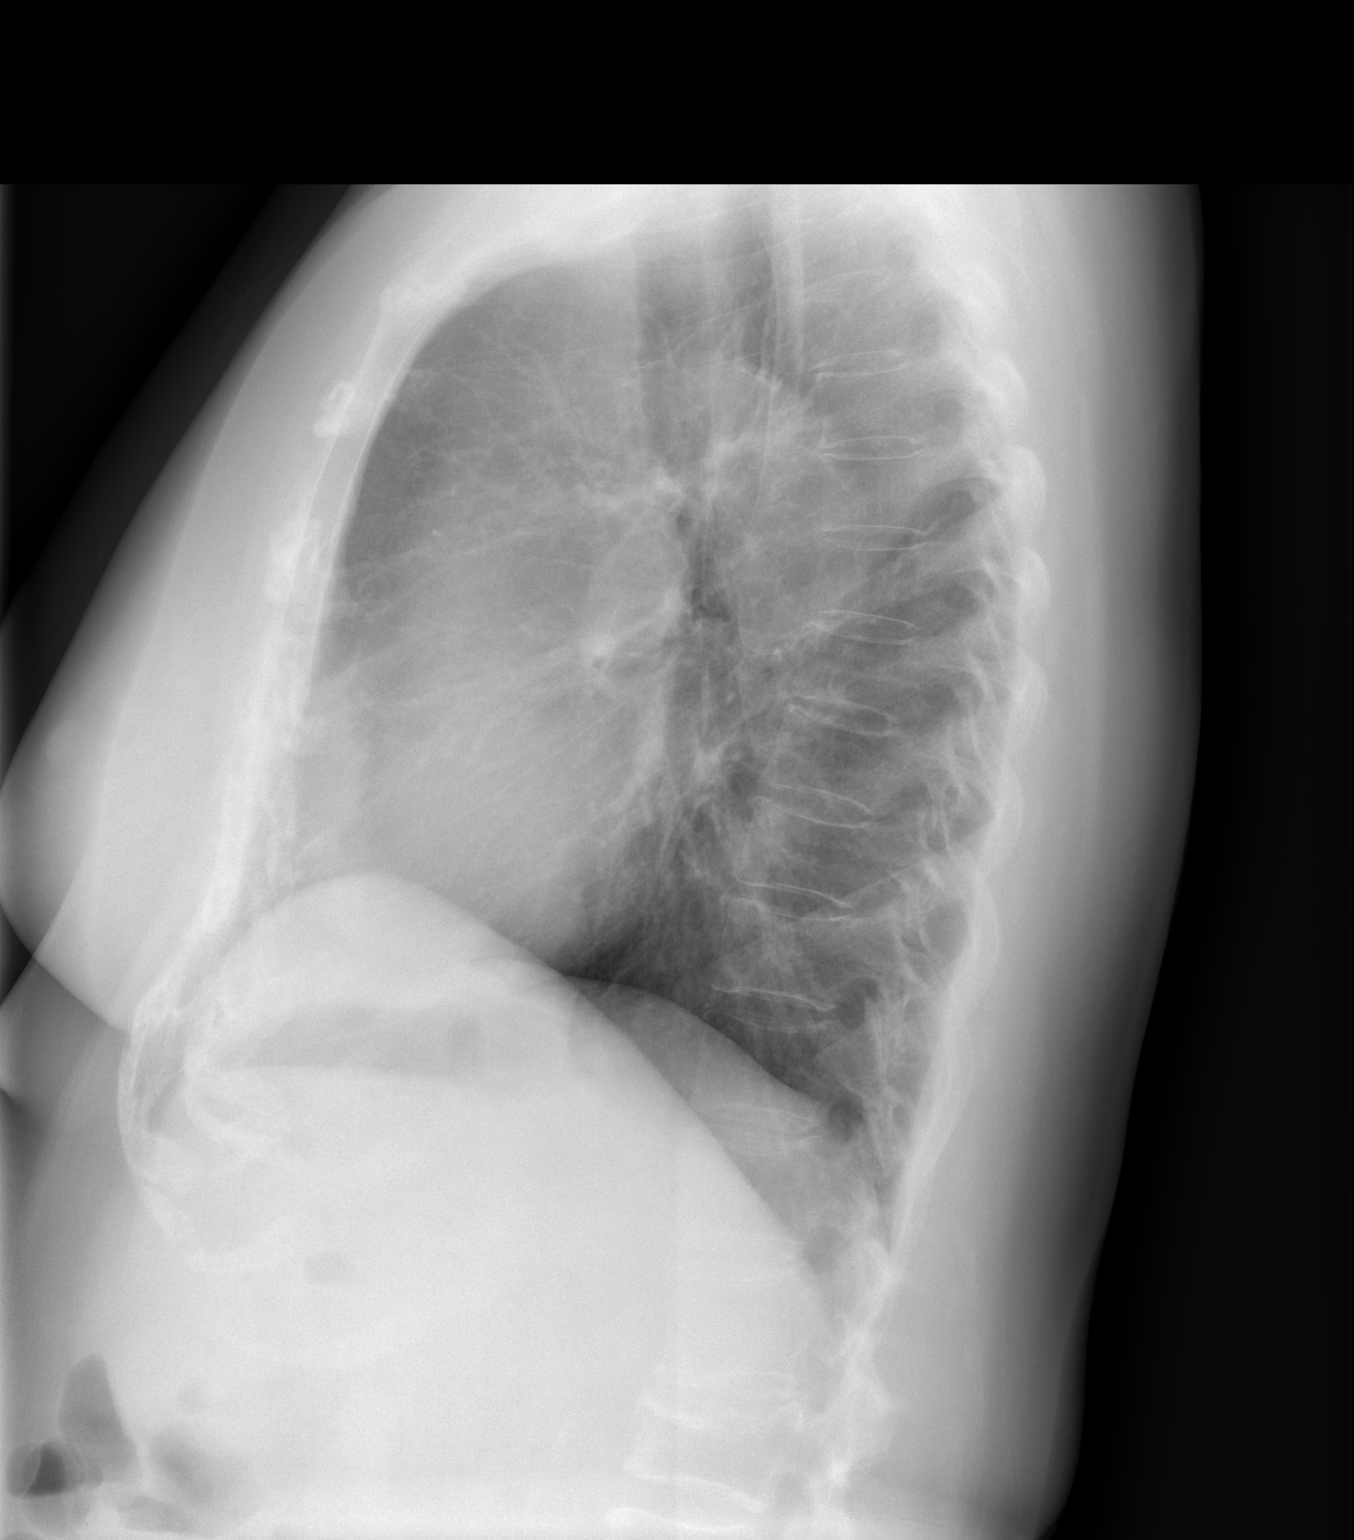

[2 of 2 positions shown; findings below may reference images not displayed]

FINDINGS: The cardiomediastinal silhouette is unremarkable.
Mild peribronchial thickening is stable.
There is no evidence of focal airspace disease, pulmonary edema,
pleural effusion, or pneumothorax.
No acute bony abnormalities are identified.
A remote lumbar compression fracture is again identified.
IMPRESSION: No evidence of active cardiopulmonary disease.

## 2009-11-22 ENCOUNTER — Ambulatory Visit (HOSPITAL_COMMUNITY): Admission: RE | Admit: 2009-11-22 | Discharge: 2009-11-22 | Payer: Self-pay | Admitting: Urology

## 2009-11-22 IMAGING — RF DG RETROGRADE PYELOGRAM
1 series · 5 of 5 positions shown · non-contrast
Comparison: None

CLINICAL DATA: 84-year-old female with bladder tumor.

INTRAOPERATIVE  right RETROGRADE UROGRAPHY
TECHNIQUE: Images were obtained with the C-arm fluoroscopic device
intraoperatively and submitted for interpretation post-operatively.
Please see the procedural report for the amount of contrast and the
fluoroscopy time utilized.

[Series 1: run · 5 of 5 slices shown]
[im 1/5]
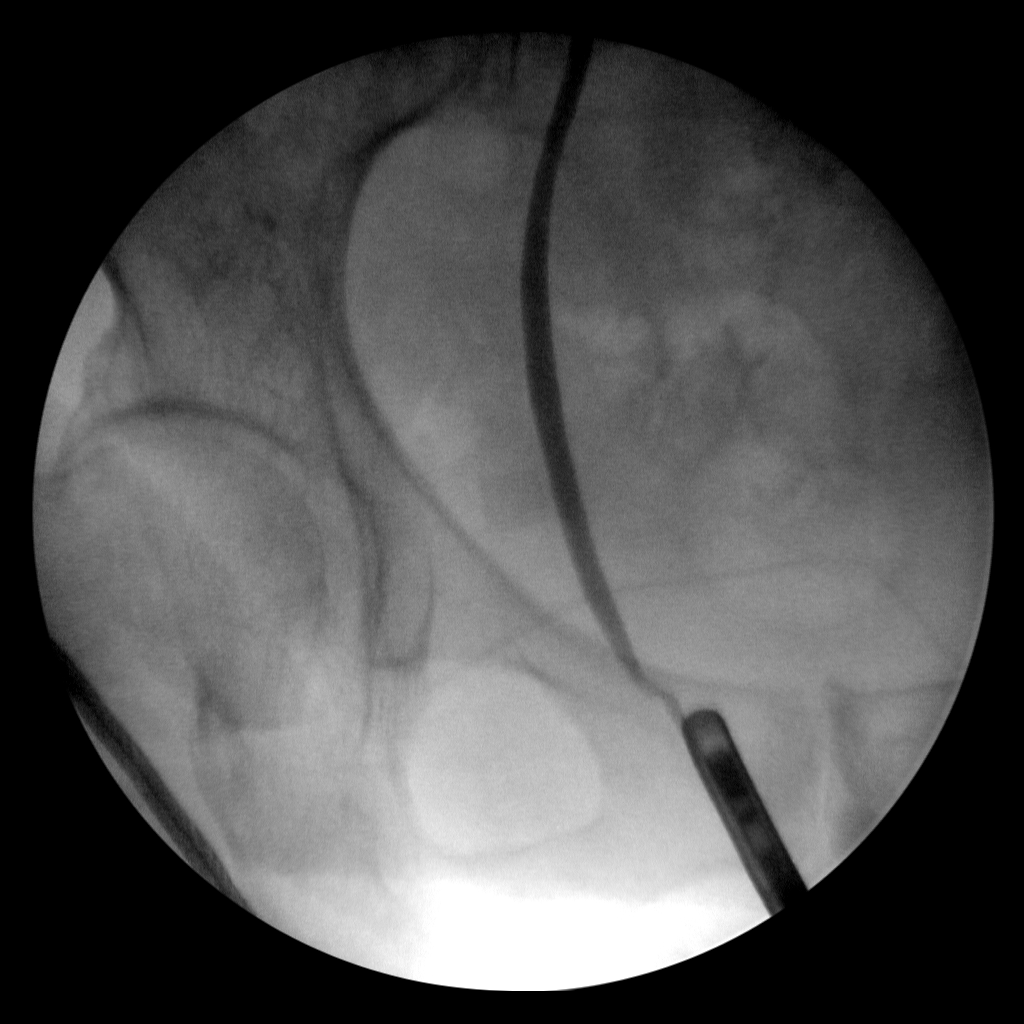
[im 2/5]
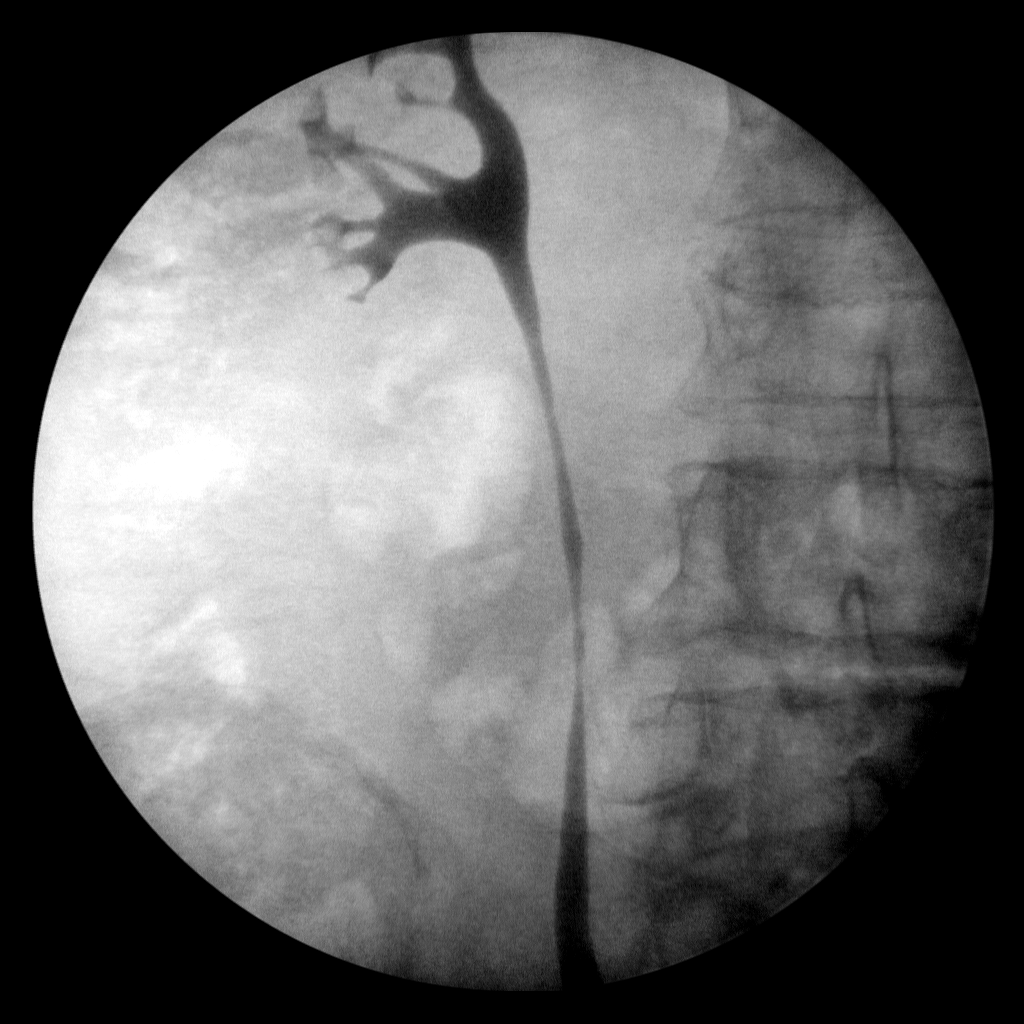
[im 3/5]
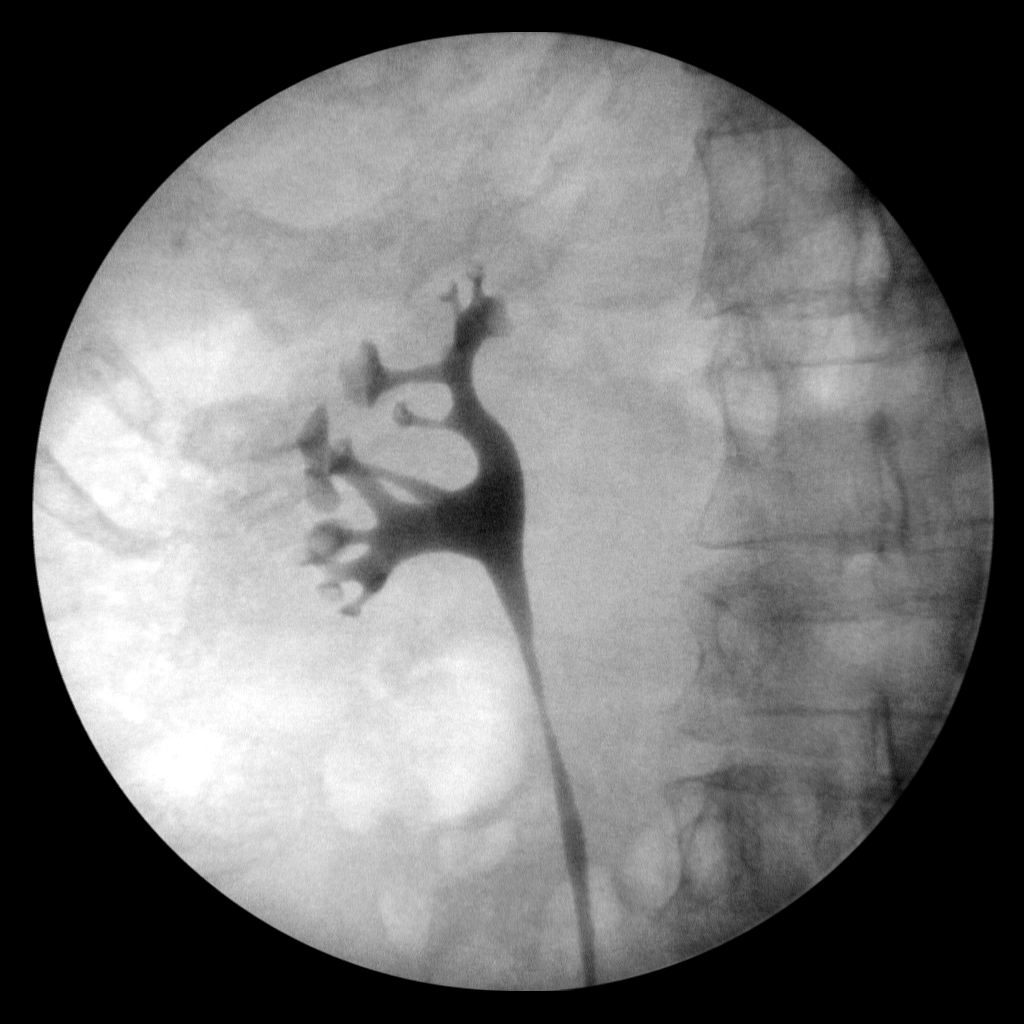
[im 4/5]
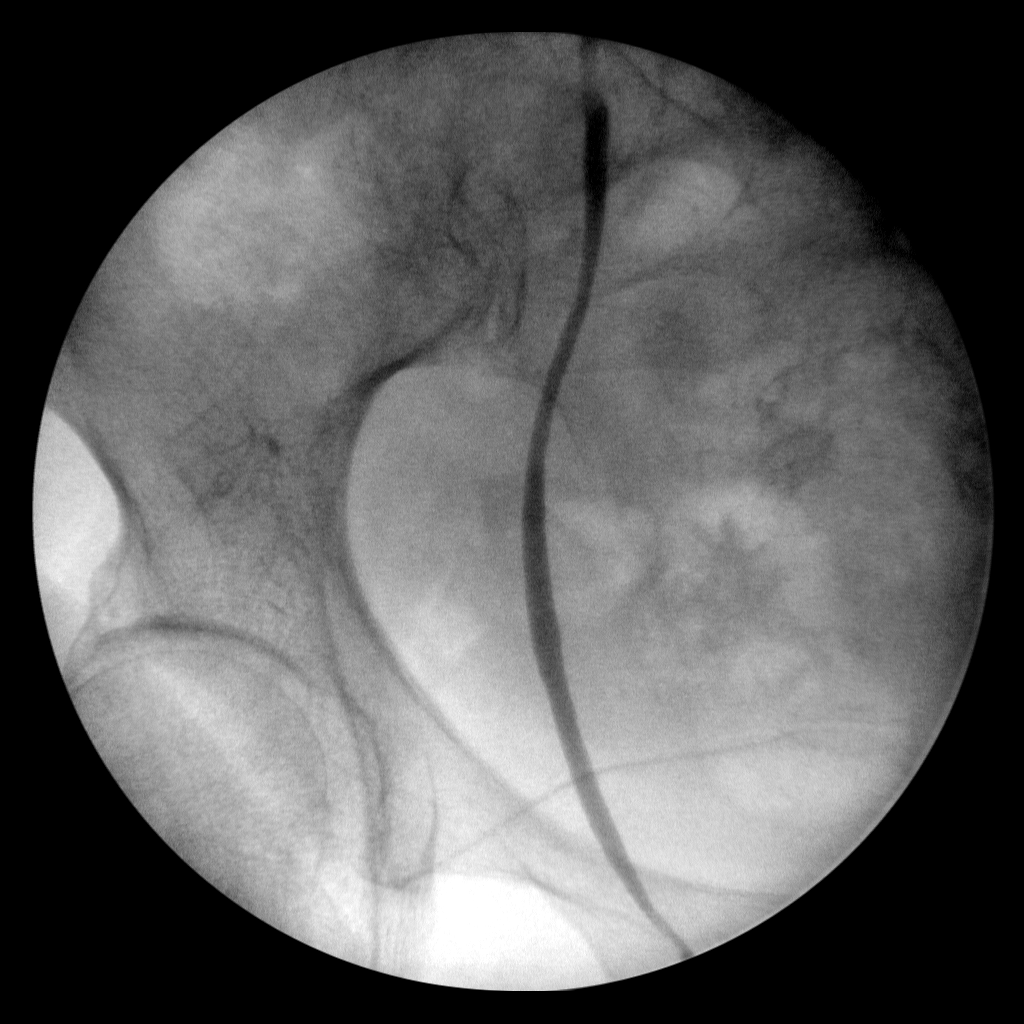
[im 5/5]
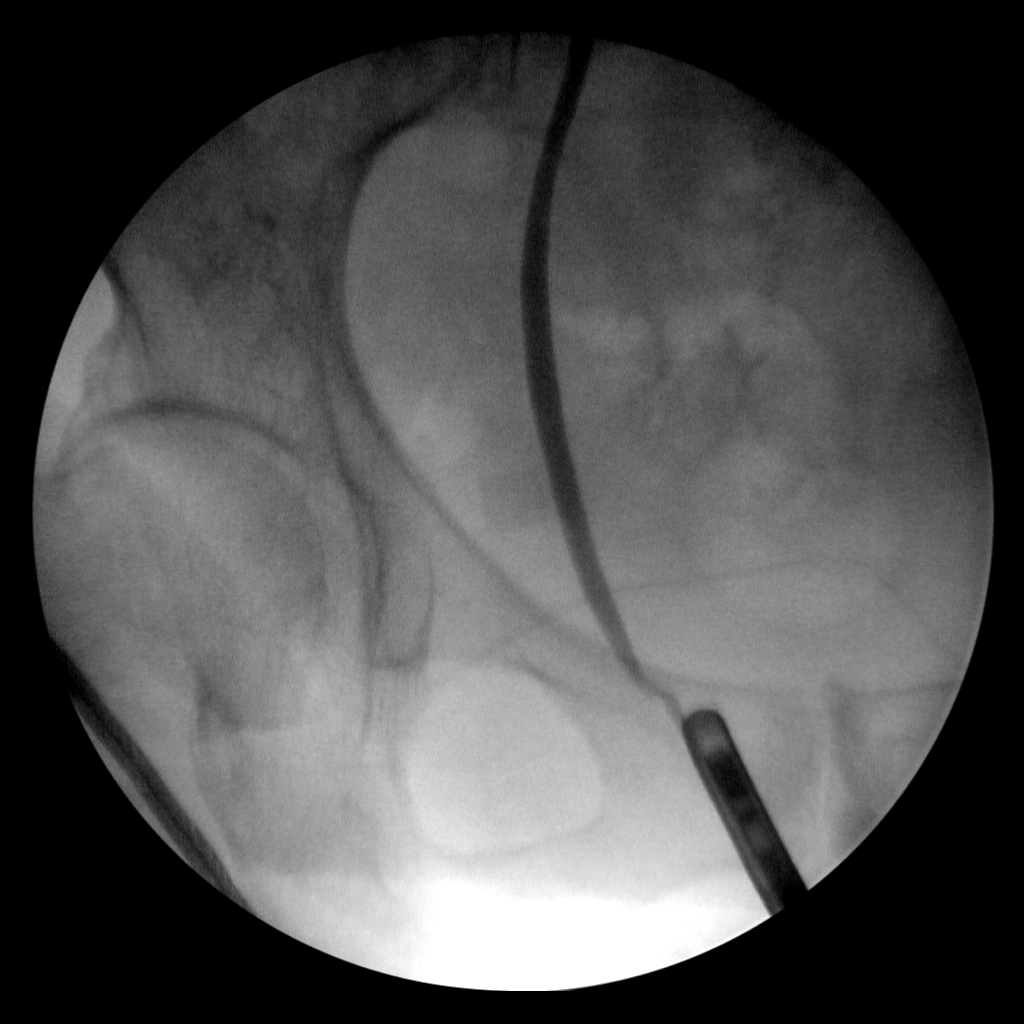

[5 of 5 positions shown; findings below may reference images not displayed]

FINDINGS: Submitted images of the right ureter and intrarenal
collecting system demonstrates no filling defects or fixed areas of
narrowing.
The right ureter is normal in caliber.
There is no evidence of right hydronephrosis.
IMPRESSION: Unremarkable right retrograde urinary study.  No evidence of
synchronous right uroepithelial malignancy.

## 2010-05-23 LAB — BASIC METABOLIC PANEL
BUN: 22 mg/dL (ref 6–23)
Calcium: 9.6 mg/dL (ref 8.4–10.5)
Chloride: 104 mEq/L (ref 96–112)
Creatinine, Ser: 1.09 mg/dL (ref 0.4–1.2)
GFR calc Af Amer: 58 mL/min — ABNORMAL LOW (ref >60)
GFR calc non Af Amer: 48 mL/min — ABNORMAL LOW (ref >60)
Glucose, Bld: 102 mg/dL — ABNORMAL HIGH (ref 70–99)
Potassium: 4.6 mEq/L (ref 3.5–5.1)
Sodium: 140 mEq/L (ref 135–145)

## 2010-05-23 LAB — CBC
HCT: 41.8 % (ref 36.0–46.0)
MCH: 33.1 pg (ref 26.0–34.0)
MCHC: 34.3 g/dL (ref 30.0–36.0)
MCV: 96.5 fL (ref 78.0–100.0)
WBC: 9.6 10*3/uL (ref 4.0–10.5)

## 2010-05-23 LAB — SURGICAL PCR SCREEN: Staphylococcus aureus: POSITIVE — AB

## 2010-06-18 LAB — URINE CULTURE
Colony Count: NO GROWTH
Culture: NO GROWTH

## 2010-06-18 LAB — URINE MICROSCOPIC-ADD ON

## 2010-06-18 LAB — URINALYSIS, ROUTINE W REFLEX MICROSCOPIC
Bilirubin Urine: NEGATIVE
Glucose, UA: NEGATIVE mg/dL
Glucose, UA: NEGATIVE mg/dL
Nitrite: NEGATIVE
Specific Gravity, Urine: 1.005 (ref 1.005–1.030)
Specific Gravity, Urine: 1.005 (ref 1.005–1.030)
pH: 6 (ref 5.0–8.0)

## 2010-06-18 LAB — PROTIME-INR
INR: 1 (ref 0.00–1.49)
Prothrombin Time: 13.6 seconds (ref 11.6–15.2)

## 2010-06-18 LAB — DIFFERENTIAL
Eosinophils Absolute: 0.2 10*3/uL (ref 0.0–0.7)
Monocytes Absolute: 0.7 10*3/uL (ref 0.1–1.0)
Monocytes Relative: 7 % (ref 3–12)

## 2010-06-18 LAB — BASIC METABOLIC PANEL
GFR calc Af Amer: >60 mL/min (ref >60)
Glucose, Bld: 92 mg/dL (ref 70–99)
Sodium: 141 mEq/L (ref 135–145)

## 2010-06-18 LAB — CBC
MCV: 94.3 fL (ref 78.0–100.0)
RBC: 4.43 MIL/uL (ref 3.87–5.11)

## 2010-06-19 LAB — T3, FREE: T3, Free: 2.4 pg/mL (ref 2.3–4.2)

## 2010-06-19 LAB — CBC
HCT: 38.3 % (ref 36.0–46.0)
Hemoglobin: 13.2 g/dL (ref 12.0–15.0)
RDW: 13.5 % (ref 11.5–15.5)
WBC: 9.7 10*3/uL (ref 4.0–10.5)

## 2010-06-19 LAB — BASIC METABOLIC PANEL WITH GFR
BUN: 18 mg/dL (ref 6–23)
CO2: 25 meq/L (ref 19–32)
Calcium: 8.6 mg/dL (ref 8.4–10.5)
Chloride: 103 meq/L (ref 96–112)
Creatinine, Ser: 1.08 mg/dL (ref 0.4–1.2)
GFR calc non Af Amer: 49 mL/min — ABNORMAL LOW
Glucose, Bld: 91 mg/dL (ref 70–99)
Potassium: 3.8 meq/L (ref 3.5–5.1)
Sodium: 136 meq/L (ref 135–145)

## 2010-06-19 LAB — COMPLEMENT, TOTAL: Compl, Total (CH50): 52 U/mL (ref 31–66)

## 2010-06-19 LAB — T4, FREE: Free T4: 1.01 ng/dL (ref 0.89–1.80)

## 2010-06-19 LAB — SEDIMENTATION RATE: Sed Rate: 66 mm/hr — ABNORMAL HIGH (ref 0–22)

## 2010-06-19 LAB — ANA: Anti Nuclear Antibody(ANA): NEGATIVE

## 2010-06-19 LAB — C-REACTIVE PROTEIN: CRP: 13.3 mg/dL — ABNORMAL HIGH (ref <0.6)

## 2010-06-20 LAB — CBC
MCHC: 34.7 g/dL (ref 30.0–36.0)
Platelets: 179 10*3/uL (ref 150–400)
RDW: 13.3 % (ref 11.5–15.5)

## 2010-06-20 LAB — BASIC METABOLIC PANEL
CO2: 25 mEq/L (ref 19–32)
Calcium: 8.6 mg/dL (ref 8.4–10.5)
Creatinine, Ser: 0.99 mg/dL (ref 0.4–1.2)
Glucose, Bld: 104 mg/dL — ABNORMAL HIGH (ref 70–99)

## 2010-06-20 LAB — D-DIMER, QUANTITATIVE: D-Dimer, Quant: 0.77 ug/mL-FEU — ABNORMAL HIGH (ref 0.00–0.48)

## 2010-06-20 LAB — POCT CARDIAC MARKERS

## 2010-07-23 NOTE — Discharge Summary (Signed)
[name], [name]                  ACCOUNT NO.:  [id number]   MEDICAL RECORD NO.:  [id number]          PATIENT TYPE:  INP   LOCATION:  6732                         FACILITY:  [facility]   PHYSICIAN:  Mark A. Perini, M.D.   DATE OF BIRTH:  June 25, 1924   DATE OF ADMISSION:  06/07/2008  DATE OF DISCHARGE:  06/08/2008                               DISCHARGE SUMMARY   DISCHARGE DIAGNOSES:  1. Pleuritic chest pain of unclear cause.  The patient has some      scattered ground-glass opacities on chest CAT scan.  [name] may have      some sort of atypical infection, improved at the time of discharge.  2. Incidentally found 3 x 5 cm thyroid solid mass, which will be      biopsied next week as an outpatient.  3. Hypertension.  4. Pulmonary hypertension seen on CT scan, echocardiogram done this      admission, results pending at the time of discharge.  5. Hyperlipidemia.  6. Past history of papillary bladder tumor in 2008.  7. A remote history of colon polyps.   PROCEDURES:  1. CT angiogram of the chest performed upon admission, which shows      left thyroid mass, negative for pulmonary embolism, mosaic      attenuation of the lungs, compatible with a small airways disease,      no acute infiltrate, prominent pulmonary arteries, may reflect      pulmonary arterial hypertension, and there were some chronic      bronchiectatic changes.  2. Thyroid ultrasound performed on June 08, 2008, which showed a      solitary dominant left thyroid mass, consider aspiration biopsy to      exclude neoplasm.   DISCHARGE MEDICATIONS:  1. Benazepril 10 mg daily.  2. Pravachol 40 mg each evening.  3. 81 mg aspirin 3 times a week but [name] will hold this with the      upcoming thyroid biopsy.  4. [name] is to wait 2 weeks before resuming her fish oil pills.  5. [name] is to take over-the-counter Align 1 capsule daily for 2-3      weeks.  6. Avelox 40 mg daily for the next 6 days.  7. [name] may use Tylenol 500 mg 1-2 pills  every 6 hours as needed for      pain.  8. [name] may take Prilosec 20 mg daily with food if [name] feels that [name]      is having any acid reflux.   HISTORY OF PRESENT ILLNESS:  [name] is a pleasant 77 year old female who  presented to our office with 12-16 hours of pleuritic central chest  pain.  In the office, it was felt that [name] might have had some EKG  changes and was therefore transported to the emergency room by  ambulance.  In the emergency room, [name] had negative cardiac enzymes, and  a CT scan of the chest, which showed the findings as noted above.  [name]  had an elevated D-dimer prior to the a CT angiogram.  The pain has been  gradually improving.  Even at the time of her admission, it seemed to be  improving in facts.  Due to the findings on her a CAT scan and her  symptoms, [name] was admitted for further care.   HOSPITAL COURSE:  [name] was admitted to a regular bed.  [name] remained  stable from a cardiovascular and pulmonary standpoint.  Her pain  gradually improved, and [name] did not require any pain medication.  [name]  was afebrile.  [name] did undergo the diagnostic studies as noted above,  and [name] was given IV Avelox.  By late in the day on June 08, 2008, [name]  was deemed stable for discharge home.   DISCHARGE PHYSICAL EXAMINATION:  VITAL SIGNS:  Temperature 98.2,  afebrile, pulse 94, respiratory rate 18, blood pressure 100/50, 92%  saturation on room air, weight 76.9 kg.  GENERAL:  [name] was in no acute distress.  Alert and oriented x4.  LUNGS:  Clear to auscultation bilaterally with no wheezes, rales, or  rhonchi.  HEART:  Regular rate and rhythm with a 1-2/6 murmur, heard at the left  and right sternal border in systole.  There was no rub appreciated.  ABDOMEN:  [name] had a soft, nontender, nondistended abdomen with no masses  and no peripheral edema.   NOTABLE LABORATORY DATA:  Free T3 was normal at 2.4 pg/mL, free T4 was  normal at 1.01 ng/dL, and TSH was normal at 4.16.  White count  was 9.7  on June 08, 2008, hemoglobin 13.2, and platelet count of 168,000.  Sodium 136, potassium 3.8, chloride 103, CO2 25, BUN 18, creatinine  1.08, glucose 91, GFR 49, and calcium 8.6.  A rheumatoid factor, sed  rate, CRP, ANA, and ANCA study were pending at the time of discharge.   DISCHARGE INSTRUCTIONS:  [name] is to follow a low-salt diet.  [name] is to  call if [name] has any recurrent problems.  [name] is to follow up with Dr.  Waynard Edwards in 2-3 weeks and [name] is to get her thyroid biopsy done next week,  and we are scheduling this at this time.      Mark A. Perini, M.D.  Electronically Signed     MAP/MEDQ  D:  06/08/2008  T:  06/09/2008  Job:  [id number]

## 2010-07-23 NOTE — H&P (Signed)
[name], [name]                  ACCOUNT NO.:  [id number]   MEDICAL RECORD NO.:  [id number]          PATIENT TYPE:  OBV   LOCATION:  6732                         FACILITY:  [facility]   PHYSICIAN:  Tera Mater. Evlyn Kanner, M.D. DATE OF BIRTH:  01/04/1925   DATE OF ADMISSION:  06/07/2008  DATE OF DISCHARGE:                              HISTORY & PHYSICAL   [name] is an 77 year old white female with a history of hypertension  and hyperlipidemia who presented to our office today for evaluation.  She had an onset last evening and during the night a pleuritic chest  pain that was a vague discomfort that got worse with deep breathing.  She is awake from about 2 a.m. went out to her chair and basically slept  fully if at all during the night.  Position did not seem to make this  worse.  She did notice some chills but no measured fevers.  She was seen  in our office today and sent over for further evaluation and basically I  was trying to rule out pericarditis or pulmonary embolus.  An extensive  and thorough workups have been done by the emergency room staff  specifically by Dr. Weldon Inches and has found finding of an abnormal CT of  the chest suggesting an infiltrative patchy process and the patient has  also spiked a fever while here.  There does not appear to be any cardiac  component to these recent complaints by the workup that has been done  thus far.  There is no evidence of pulmonary embolus despite the fact  that the D-dimer was up when she first came in.  The patient was  actually feeling quite a bit better at the present.  She notes that the  pleuritic chest pain is still there when she takes a deep breath but is  not as sharp as before.  This pain is throughout the anterior chest a  little bit more on the right than the left but not predictably so.  The  patient did have some fever earlier as will be noted below.  She notes  that she has had some mild aches in one shoulder but no real  arthralgias  or myalgias.  Her weight has been relatively stable with a difficulty in  losing weight.  She notes no history of anything like this before.  She  has noted no peripheral edema.  Her bowels are working well with no  evidence of diarrhea.  She has had no headaches or dizzy spells and her  vision has been fine.  She has had no neurologic symptoms of any sort.  She has had no unusual travels or exposures.  No one has been sick  around her.  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  She does not have any unusual animals or pets  at home.   PAST MEDICAL HISTORY:  1. Hypertension.  2. Hyperlipidemia.  3. She had a papillary bladder tumor resected in 2008.  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  It had low malignant potential.  4. She also had a history of colon polyps first in 29-Jul-1998 and then      normal in 07-29-03.  5. She has had hyperlipidemia and hypertension as noted.   SURGICAL HISTORY:  She has never had any surgical history.  She has not  been in the hospital overnight.   SOCIAL HISTORY:  She is a retired Interior and spatial designer.  She is a lifelong  nonsmoker.  She has been married twice.  Her first husband died in 24  of nephritis.  Her second husband died in 1998/07/29 of myeloma.  She has one  son and one grandchild.   CURRENT MEDICATION:  Benazepril, Pravachol and aspirin.   ALLERGIES:  NEOMYCIN and SULFA.   PHYSICAL EXAMINATION:  VITAL SIGNS:  Blood pressure is 140/63, pulse was  102, respirations 18, temperature 98.8, O2 sat was 99%.  She did have a  measured fever, it was verbally reported to me as over 100 earlier, I do  not see it here in the records.  GENERAL:  We have a fairly healthy and youthful appearing white female  lying quietly, nearly flat, no distress.  HEENT:  Sclerae anicteric.  Eyegrounds are benign with no evidence of  any changes.  Extraocular  movements are intact with no nystagmus.  No  exophthalmos or lid lag is noted.  Oral mucous membranes are moist.  Dentition is in good repair.  Oropharynx is clear.  NECK:  Supple.  There is a thyroid goiter felt when she swallows on the  left.  It is a little hard to feel other than the top of it when she is  not swallowing.  LUNGS:  A few scattered rhonchi.  No wheezes are present.  No accessory  muscle use.  I hear no rubs.  HEART:  Regular and distant.  I cannot appreciate any murmurs.  ABDOMEN:  Soft, obese, nontender with no hepatosplenomegaly.  No masses  or pulsations are noted.  EXTREMITIES:  Pulses to be strong with no peripheral edema.  No chronic  venous stasis changes or evidence of a prior edema are noted.  NEUROLOGIC:  The patient is awake, alert.  Her mentation is good.  Speech is clear.  She is slightly hard of hearing.   LABORATORY DATA:  A CT angio of the chest shows a 3.5 x 3.6 cm solid  mass arising from the inferior pole of the left thyroid and goes into  the superior  mediastinum and displaces the trachea to the right.  The  angiogram was negative for pulmonary embolism.  There is no acute  mediastinal abnormality or chronic bronchitic changes at present.  There  is no pericardial or pleural effusions.  There was a moderate prominent  caliber of the central pulmonary arteries reflecting an element of PAH  and a mildly hazy ground-glass attenuation and the lungs having somewhat  of a mosaic pattern consistent with small airway disease.  Her chest x-  Rote done today shows probable chronic bronchitic changes.  No acute  findings.  Lab data otherwise cardiac markers show a troponin less than  0.05.  A myoglobin and point of care of 106 and an MB of less than 1.  Her sodium is 131, potassium is 4.0, chloride 98, CO2 25, BUN 21,  creatinine 0.99, glucose 104, calcium 8.6.  D-dimer was elevated at  0.77.  White count 13,800 with  a hemoglobin of 13.9, MCV 95.4, and   platelets 179,000.   In summary, we have a 77 year old white female presenting with pleuritic-  type chest pain, cough and some fever.  She does have a leukocytosis as  well and an abnormal chest x-Bogacki and CT suggesting of bronchitic and/or  infiltrative pattern.  Some unusual features of this include the lack of  productive cough, the relatively rapid onset of this illness and the  evidence on her CT of what appears to be scarring and chronic bronchitic  changes possibly even bronchiectasis but no clinical history that  supports this.  The patient to her knowledge has never had a pneumonia  or been treated for bronchitis even more than once.  She has no chronic  pulmonary symptoms that would be referable to these issues.  She has no  unusual travels or exposures to suggest this could be anything really  strange but the acute onset is a bit unusual.  The question of pulmonary  artery hypertension is also raised by this scan and an echocardiogram  probably is reasonable.  She does have a body habitus that could be  consistent with sleep apnea but does not have any prior history of such.  In addition this new problem of a large goiter with tracheal  displacement is a totally new problem for her.  She has not had any  stridor or symptoms referable this despite the size of this.  The CT  does not really show the true nodularity of this but it does appear to a  multinodular goiter and thyroid function testing certainly needs to be  checked given the tachycardia.  I do not think it has anything to do  with her pulmonary symptoms.  There is certainly nothing to do with the  infiltrative pattern.  It would be tremendously strange thing if this  represented some unusual form of thyroid cancer, although I guess that  it is in the differential.  At the present time  given these multiple  unresolved issues and with tough night she had last night we are going  to bring her in under observation status.   We are going to give her a  little supplemental oxygen at 2 liters nasal cannula and IV Avelox.  We  are going to recheck her CBC in the morning and recheck her BMET to see  if the minor hyponatremia has resolved.  There is no evidence of  malignant process here.  An echocardiogram will need to be done and  probably a sleep study although we may choose to do those more as an  outpatient.  At the present time, we have a puzzling disconnect of her  clinical symptoms in onset in these lab findings and x-Santmyer findings.  We  are going to have to reconcile these in some manner and see if we can  get a more cohesive diagnosis.  Her blood pressure is under reasonable  control and her benazepril will be continued, her Pravachol can be  continued as well as well as her aspirin.  She will be given a low dose  Lovenox for a DVT prophylaxis.  There is obviously no evidence of  pulmonary embolus even though the D-dimer is up.  Whether or not thyroid  biopsy will be undertaken will be decided presenting on further workup.  We would not be able to do a  thyroid scan uptake for a while due to the  contrast dye she got today.  ______________________________  Tera Mater Evlyn Kanner, M.D.     SAS/MEDQ  D:  06/07/2008  T:  06/08/2008  Job:  [id number]

## 2010-07-23 NOTE — Op Note (Signed)
NAMEANNALYCE, [name]                  ACCOUNT NO.:  [id number]   MEDICAL RECORD NO.:  [id number]          PATIENT TYPE:  OIB   LOCATION:  5123                         FACILITY:  [facility]   PHYSICIAN:  Velora Heckler, MD      DATE OF BIRTH:  1924/08/20   DATE OF PROCEDURE:  07/17/2008  DATE OF DISCHARGE:                               OPERATIVE REPORT   PREOPERATIVE DIAGNOSIS:  Left thyroid nodule.   POSTOPERATIVE DIAGNOSIS:  Left thyroid nodule.   PROCEDURE:  Left thyroid lobectomy.   SURGEON:  Velora Heckler, MD, FACS   ANESTHESIA:  General.   ESTIMATED BLOOD LOSS:  Minimal.   PREPARATION:  ChloraPrep.   COMPLICATIONS:  None.   INDICATIONS:  The patient is an 77 year old white female from  [place], [place].  CT scan of the chest in March 2010,  demonstrated an incidental finding of a left thyroid mass.  Thyroid  ultrasound in April 2010, showed a 3.0 x 3.3 x 4.6 cm nodule at the  inferior pole of the left thyroid lobe.  Fine-needle aspiration showed  oncocytic changes with follicular epithelium.  The patient was referred  to general surgery for resection for definitive diagnosis.   BODY OF REPORT:  Procedure was done in the OR #60, at the [facility].  The patient was brought to the operating room,  placed in supine position on the operating room table.  Following  administration of general anesthesia, the patient was positioned and  then prepped and draped in the usual strict aseptic fashion.  After  ascertaining that an adequate level of anesthesia had been achieved, a  Kocher incision was made with a #15 blade.  Dissection was carried  through subcutaneous tissues and platysma.  Hemostasis was obtained with  the electrocautery.  Skin flaps were elevated cephalad and caudad from  the thyroid notch to the sternal notch.  A Mahorner self-retaining  retractor was placed for exposure.  Strap muscles were incised in the  midline and dissection was  begun on the left side.  Strap muscles were  reflected laterally.  Venous tributaries were divided between medium  Ligaclips with the harmonic scalpel.  Left thyroid lobe was exposed.  There is a dominant mass in the inferior pole.  This was gently  mobilized with blunt dissection.  Superior pole was dissected out.  Superior pole vessels were divided between medium hemoclips with the  harmonic scalpel.  Gland was rolled anteriorly.  Branches of the  inferior thyroid artery were divided between small Ligaclips with the  harmonic scalpel.  Inferior venous tributaries were divided between  medium Ligaclips with the harmonic scalpel.  Gland was rolled further  anteriorly.  Ligament of Allyson Sabal was transected with the electrocautery.  Recurrent nerve was identified and preserved.  Gland was excised off the  anterior trachea.  There was no significant pyramidal lobe.  Isthmus was  mobilized across the midline.  Isthmus was transected at its junction  with the right thyroid lobe with the harmonic scalpel.  The entire left  lobe was then  submitted as specimen to pathology for review.  Left neck  was irrigated with warm saline.  Hemostasis obtained with the  electrocautery.  Surgicel was placed in the operative field.  Strap  muscles on the right were reflected laterally.  Right lobe was exposed.  It was small and normal without nodularity.  No gross abnormality.   Strap muscles were reapproximated in the midline with interrupted 3-0  Vicryl sutures.  Platysma was closed with interrupted 3-0 Vicryl  sutures.  Skin was closed with running 4-0 Monocryl and subcuticular  suture.  Wound was washed and dried, Benzoin and Steri-Strips were  applied.  Sterile dressings were applied.  The patient was awakened from  anesthesia and brought to the recovery room in stable condition.  The  patient tolerated the procedure well.      Velora Heckler, MD  Electronically Signed     TMG/MEDQ  D:  07/17/2008  T:   07/17/2008  Job:  [id number]   cc:   Loraine Leriche A. Perini, M.D.

## 2010-07-26 NOTE — Op Note (Signed)
NAME:  [name], [name]                            ACCOUNT NO.:  [id number]   MEDICAL RECORD NO.:  [id number]                   PATIENT TYPE:  AMB   LOCATION:  ENDO                                 FACILITY:  [facility]   PHYSICIAN:  Georgiana Spinner, M.D.                 DATE OF BIRTH:  02/01/1925   DATE OF PROCEDURE:  04/07/2003  DATE OF DISCHARGE:                                 OPERATIVE REPORT   PROCEDURE:  Colonoscopy.   INDICATIONS:  Colon cancer screening.  Patient with a previous history of  colon polyps removed by me five years ago.  Prep was Miralax in 2 L of  Gatorade.  Tolerance was excellent.  Prep result was excellent.   ANESTHESIA:  Demerol 60 mg, Versed 5 mg.   DESCRIPTION OF PROCEDURE:  With the patient mildly sedated in the left  lateral decubitus position, the Olympus videoscopic colonoscope was inserted  in the rectum and passed under direct vision to the cecum, identified by  ileocecal valve and appendiceal orifice, both of which were photographed.  As noted, the prep was excellent and visualization was excellent.  From this  point the colonoscope was slowly withdrawn taking circumferential views of  the colonic mucosa, stopping only in the rectum, which appeared normal on  direct and retroflexed view.  The endoscope was straightened and withdrawn.  The patient's vital signs and pulse oximetry remained stable.  The patient  tolerated the procedure well without apparent complications.   FINDINGS:  Negative examination at this time.   PLAN:  Consider repeat examination in possibly five years.                                               Georgiana Spinner, M.D.    GMO/MEDQ  D:  04/07/2003  T:  04/07/2003  Job:  [id number]   cc:   Loraine Leriche A. Waynard Edwards, M.D.  [address]  [place]  [place] [address]  Fax: [phone]

## 2010-07-26 NOTE — Op Note (Signed)
NAMEMARQUETA, [name]                  ACCOUNT NO.:  [id number]   MEDICAL RECORD NO.:  [id number]          PATIENT TYPE:  AMB   LOCATION:  [facility]                         FACILITY:  [facility]   PHYSICIAN:  Boston Service, M.D.DATE OF BIRTH:  [birth year]   DATE OF PROCEDURE:  03/31/2006  DATE OF DISCHARGE:                               OPERATIVE REPORT   PREOPERATIVE DIAGNOSES:  1. Bladder tumor (see endoscopic photograph).  2. Hematuria postop.   POSTOPERATIVE DIAGNOSES:  Same.   PROCEDURE:  Cystoscopy, retrograde TURBT.   SURGEON:  Dr. Boston Service.   ASSISTANT:  None.   ANESTHESIA:  General.   SPECIMENS:  Bladder tumor.   DRAINS:  20-French Foley.   INDICATIONS:  A 77 year old female hematuria workup included renal  ultrasound that showed relatively normal upper tracts without obvious  evidence of mass, hydronephrosis or stone. A 10.3 cm right kidney, 10.8  cm left kidney.  Fiberoptic cystoscopy showed a small papillary tumor  behind the right ureteral orifice, the remainder of the urothelium was  unremarkable.  The patient was prepped and draped in the dorsal  lithotomy position and after institution of an adequate level general  anesthesia, a well lubricated 21-French panendoscope was gently inserted  at the urethral meatus.  The patient has mild atrophic changes along the  urethra and bladder neck, papillary tumor behind the right ureteral  orifice.  Endoscopic photographs were obtained. A 6-French end-hole  catheter was then used for retrogrades.   With gentle injection of about 3-6 mL of contrast, the ureter, pelvis  and calyces were outlined without evidence of filling defect or  obstruction.  An identical technique was used on both the right and left  sides with prompt drainage at 3-5 minutes.   Biopsy forceps were then selected, representative areas of the papillary  tumor were then biopsied. A loop was then used to resect the deeper  layers. A ureteral catheter  had been left indwelling at the right  ureteral orifice and in order to avoid injury to the intramural ureter.  Once appropriate superficial and deep samples had been obtained, the  VaporTrode element was inserted and adequate hemostasis was obtained.  The ureteral catheter was withdrawn, clear reflux at the right orifice  without evidence of obstruction.  The bladder was filled to capacity, a  20-French Foley was  inserted after removal of the resectoscope sheath. The bladder was then  irrigated with sterile water for about 10 or 15 minutes, the Foley was  left to straight drain with a leg bag and the patient was returned to  recovery in satisfactory condition.           ______________________________  Boston Service, M.D.     RH/MEDQ  D:  03/31/2006  T:  03/31/2006  Job:  [id number]   cc:   Loraine Leriche A. Perini, M.D.  Fax: [phone]

## 2011-04-01 DIAGNOSIS — D239 Other benign neoplasm of skin, unspecified: Secondary | ICD-10-CM | POA: Diagnosis not present

## 2011-04-01 DIAGNOSIS — L821 Other seborrheic keratosis: Secondary | ICD-10-CM | POA: Diagnosis not present

## 2011-05-15 DIAGNOSIS — Z1231 Encounter for screening mammogram for malignant neoplasm of breast: Secondary | ICD-10-CM | POA: Diagnosis not present

## 2011-05-21 DIAGNOSIS — Z8551 Personal history of malignant neoplasm of bladder: Secondary | ICD-10-CM | POA: Diagnosis not present

## 2011-05-21 DIAGNOSIS — C679 Malignant neoplasm of bladder, unspecified: Secondary | ICD-10-CM | POA: Diagnosis not present

## 2011-07-23 DIAGNOSIS — I10 Essential (primary) hypertension: Secondary | ICD-10-CM | POA: Diagnosis not present

## 2011-07-23 DIAGNOSIS — E785 Hyperlipidemia, unspecified: Secondary | ICD-10-CM | POA: Diagnosis not present

## 2011-07-23 DIAGNOSIS — E559 Vitamin D deficiency, unspecified: Secondary | ICD-10-CM | POA: Diagnosis not present

## 2011-07-23 DIAGNOSIS — M109 Gout, unspecified: Secondary | ICD-10-CM | POA: Diagnosis not present

## 2011-07-23 DIAGNOSIS — E039 Hypothyroidism, unspecified: Secondary | ICD-10-CM | POA: Diagnosis not present

## 2011-07-23 DIAGNOSIS — R82998 Other abnormal findings in urine: Secondary | ICD-10-CM | POA: Diagnosis not present

## 2011-07-23 DIAGNOSIS — M899 Disorder of bone, unspecified: Secondary | ICD-10-CM | POA: Diagnosis not present

## 2011-07-30 DIAGNOSIS — Z0000 Encounter for general adult medical examination without abnormal findings: Secondary | ICD-10-CM | POA: Diagnosis not present

## 2011-07-30 DIAGNOSIS — F4321 Adjustment disorder with depressed mood: Secondary | ICD-10-CM | POA: Diagnosis not present

## 2011-07-30 DIAGNOSIS — C679 Malignant neoplasm of bladder, unspecified: Secondary | ICD-10-CM | POA: Diagnosis not present

## 2011-07-30 DIAGNOSIS — E785 Hyperlipidemia, unspecified: Secondary | ICD-10-CM | POA: Diagnosis not present

## 2011-07-30 DIAGNOSIS — Z124 Encounter for screening for malignant neoplasm of cervix: Secondary | ICD-10-CM | POA: Diagnosis not present

## 2011-08-11 DIAGNOSIS — Z1212 Encounter for screening for malignant neoplasm of rectum: Secondary | ICD-10-CM | POA: Diagnosis not present

## 2011-08-27 DIAGNOSIS — R82998 Other abnormal findings in urine: Secondary | ICD-10-CM | POA: Diagnosis not present

## 2011-08-27 DIAGNOSIS — C679 Malignant neoplasm of bladder, unspecified: Secondary | ICD-10-CM | POA: Diagnosis not present

## 2011-08-27 DIAGNOSIS — Z8551 Personal history of malignant neoplasm of bladder: Secondary | ICD-10-CM | POA: Diagnosis not present

## 2011-09-26 DIAGNOSIS — H2510 Age-related nuclear cataract, unspecified eye: Secondary | ICD-10-CM | POA: Diagnosis not present

## 2011-09-26 DIAGNOSIS — D2310 Other benign neoplasm of skin of unspecified eyelid, including canthus: Secondary | ICD-10-CM | POA: Diagnosis not present

## 2011-09-26 DIAGNOSIS — H52209 Unspecified astigmatism, unspecified eye: Secondary | ICD-10-CM | POA: Diagnosis not present

## 2011-09-26 DIAGNOSIS — H5200 Hypermetropia, unspecified eye: Secondary | ICD-10-CM | POA: Diagnosis not present

## 2011-11-18 DIAGNOSIS — M899 Disorder of bone, unspecified: Secondary | ICD-10-CM | POA: Diagnosis not present

## 2011-11-26 DIAGNOSIS — C679 Malignant neoplasm of bladder, unspecified: Secondary | ICD-10-CM | POA: Diagnosis not present

## 2012-01-13 DIAGNOSIS — Z23 Encounter for immunization: Secondary | ICD-10-CM | POA: Diagnosis not present

## 2012-02-02 DIAGNOSIS — E039 Hypothyroidism, unspecified: Secondary | ICD-10-CM | POA: Diagnosis not present

## 2012-03-30 DIAGNOSIS — L821 Other seborrheic keratosis: Secondary | ICD-10-CM | POA: Diagnosis not present

## 2012-03-30 DIAGNOSIS — L820 Inflamed seborrheic keratosis: Secondary | ICD-10-CM | POA: Diagnosis not present

## 2012-03-30 DIAGNOSIS — R21 Rash and other nonspecific skin eruption: Secondary | ICD-10-CM | POA: Diagnosis not present

## 2012-05-04 DIAGNOSIS — L570 Actinic keratosis: Secondary | ICD-10-CM | POA: Diagnosis not present

## 2012-05-04 DIAGNOSIS — L719 Rosacea, unspecified: Secondary | ICD-10-CM | POA: Diagnosis not present

## 2012-05-04 DIAGNOSIS — L259 Unspecified contact dermatitis, unspecified cause: Secondary | ICD-10-CM | POA: Diagnosis not present

## 2012-06-02 DIAGNOSIS — C679 Malignant neoplasm of bladder, unspecified: Secondary | ICD-10-CM | POA: Diagnosis not present

## 2012-06-16 DIAGNOSIS — L259 Unspecified contact dermatitis, unspecified cause: Secondary | ICD-10-CM | POA: Diagnosis not present

## 2012-06-16 DIAGNOSIS — L719 Rosacea, unspecified: Secondary | ICD-10-CM | POA: Diagnosis not present

## 2012-07-15 DIAGNOSIS — Z1231 Encounter for screening mammogram for malignant neoplasm of breast: Secondary | ICD-10-CM | POA: Diagnosis not present

## 2012-09-02 DIAGNOSIS — E785 Hyperlipidemia, unspecified: Secondary | ICD-10-CM | POA: Diagnosis not present

## 2012-09-02 DIAGNOSIS — R82998 Other abnormal findings in urine: Secondary | ICD-10-CM | POA: Diagnosis not present

## 2012-09-02 DIAGNOSIS — I10 Essential (primary) hypertension: Secondary | ICD-10-CM | POA: Diagnosis not present

## 2012-09-02 DIAGNOSIS — E039 Hypothyroidism, unspecified: Secondary | ICD-10-CM | POA: Diagnosis not present

## 2012-09-02 DIAGNOSIS — M109 Gout, unspecified: Secondary | ICD-10-CM | POA: Diagnosis not present

## 2012-09-02 DIAGNOSIS — E559 Vitamin D deficiency, unspecified: Secondary | ICD-10-CM | POA: Diagnosis not present

## 2012-09-09 DIAGNOSIS — I10 Essential (primary) hypertension: Secondary | ICD-10-CM | POA: Diagnosis not present

## 2012-09-09 DIAGNOSIS — E785 Hyperlipidemia, unspecified: Secondary | ICD-10-CM | POA: Diagnosis not present

## 2012-09-09 DIAGNOSIS — Z1212 Encounter for screening for malignant neoplasm of rectum: Secondary | ICD-10-CM | POA: Diagnosis not present

## 2012-09-09 DIAGNOSIS — E039 Hypothyroidism, unspecified: Secondary | ICD-10-CM | POA: Diagnosis not present

## 2012-09-09 DIAGNOSIS — Z1331 Encounter for screening for depression: Secondary | ICD-10-CM | POA: Diagnosis not present

## 2012-09-09 DIAGNOSIS — Z124 Encounter for screening for malignant neoplasm of cervix: Secondary | ICD-10-CM | POA: Diagnosis not present

## 2012-09-09 DIAGNOSIS — N183 Chronic kidney disease, stage 3 unspecified: Secondary | ICD-10-CM | POA: Diagnosis not present

## 2012-09-09 DIAGNOSIS — Z79899 Other long term (current) drug therapy: Secondary | ICD-10-CM | POA: Diagnosis not present

## 2012-09-09 DIAGNOSIS — Z0000 Encounter for general adult medical examination without abnormal findings: Secondary | ICD-10-CM | POA: Diagnosis not present

## 2012-09-09 DIAGNOSIS — M109 Gout, unspecified: Secondary | ICD-10-CM | POA: Diagnosis not present

## 2012-09-09 DIAGNOSIS — Z23 Encounter for immunization: Secondary | ICD-10-CM | POA: Diagnosis not present

## 2012-09-09 DIAGNOSIS — G609 Hereditary and idiopathic neuropathy, unspecified: Secondary | ICD-10-CM | POA: Diagnosis not present

## 2012-09-27 DIAGNOSIS — H5200 Hypermetropia, unspecified eye: Secondary | ICD-10-CM | POA: Diagnosis not present

## 2012-09-27 DIAGNOSIS — H524 Presbyopia: Secondary | ICD-10-CM | POA: Diagnosis not present

## 2012-09-27 DIAGNOSIS — H2510 Age-related nuclear cataract, unspecified eye: Secondary | ICD-10-CM | POA: Diagnosis not present

## 2012-09-27 DIAGNOSIS — D2310 Other benign neoplasm of skin of unspecified eyelid, including canthus: Secondary | ICD-10-CM | POA: Diagnosis not present

## 2012-11-17 DIAGNOSIS — C679 Malignant neoplasm of bladder, unspecified: Secondary | ICD-10-CM | POA: Diagnosis not present

## 2012-12-23 DIAGNOSIS — Z23 Encounter for immunization: Secondary | ICD-10-CM | POA: Diagnosis not present

## 2013-02-14 DIAGNOSIS — L2089 Other atopic dermatitis: Secondary | ICD-10-CM | POA: Diagnosis not present

## 2013-05-18 DIAGNOSIS — H903 Sensorineural hearing loss, bilateral: Secondary | ICD-10-CM | POA: Diagnosis not present

## 2013-06-01 DIAGNOSIS — C679 Malignant neoplasm of bladder, unspecified: Secondary | ICD-10-CM | POA: Diagnosis not present

## 2013-06-01 DIAGNOSIS — Z8551 Personal history of malignant neoplasm of bladder: Secondary | ICD-10-CM | POA: Diagnosis not present

## 2013-09-29 DIAGNOSIS — H524 Presbyopia: Secondary | ICD-10-CM | POA: Diagnosis not present

## 2013-09-29 DIAGNOSIS — H2510 Age-related nuclear cataract, unspecified eye: Secondary | ICD-10-CM | POA: Diagnosis not present

## 2013-09-29 DIAGNOSIS — H25019 Cortical age-related cataract, unspecified eye: Secondary | ICD-10-CM | POA: Diagnosis not present

## 2013-09-29 DIAGNOSIS — H5200 Hypermetropia, unspecified eye: Secondary | ICD-10-CM | POA: Diagnosis not present

## 2013-10-07 DIAGNOSIS — E039 Hypothyroidism, unspecified: Secondary | ICD-10-CM | POA: Diagnosis not present

## 2013-10-07 DIAGNOSIS — M109 Gout, unspecified: Secondary | ICD-10-CM | POA: Diagnosis not present

## 2013-10-07 DIAGNOSIS — E785 Hyperlipidemia, unspecified: Secondary | ICD-10-CM | POA: Diagnosis not present

## 2013-10-07 DIAGNOSIS — R809 Proteinuria, unspecified: Secondary | ICD-10-CM | POA: Diagnosis not present

## 2013-10-07 DIAGNOSIS — I10 Essential (primary) hypertension: Secondary | ICD-10-CM | POA: Diagnosis not present

## 2013-10-07 DIAGNOSIS — E559 Vitamin D deficiency, unspecified: Secondary | ICD-10-CM | POA: Diagnosis not present

## 2013-10-07 DIAGNOSIS — R82998 Other abnormal findings in urine: Secondary | ICD-10-CM | POA: Diagnosis not present

## 2013-10-28 DIAGNOSIS — E785 Hyperlipidemia, unspecified: Secondary | ICD-10-CM | POA: Diagnosis not present

## 2013-10-28 DIAGNOSIS — N183 Chronic kidney disease, stage 3 unspecified: Secondary | ICD-10-CM | POA: Diagnosis not present

## 2013-10-28 DIAGNOSIS — Z0000 Encounter for general adult medical examination without abnormal findings: Secondary | ICD-10-CM | POA: Diagnosis not present

## 2013-10-28 DIAGNOSIS — E039 Hypothyroidism, unspecified: Secondary | ICD-10-CM | POA: Diagnosis not present

## 2013-10-28 DIAGNOSIS — G609 Hereditary and idiopathic neuropathy, unspecified: Secondary | ICD-10-CM | POA: Diagnosis not present

## 2013-10-28 DIAGNOSIS — M109 Gout, unspecified: Secondary | ICD-10-CM | POA: Diagnosis not present

## 2013-10-28 DIAGNOSIS — F4321 Adjustment disorder with depressed mood: Secondary | ICD-10-CM | POA: Diagnosis not present

## 2013-10-28 DIAGNOSIS — Z124 Encounter for screening for malignant neoplasm of cervix: Secondary | ICD-10-CM | POA: Diagnosis not present

## 2013-10-28 DIAGNOSIS — Z79899 Other long term (current) drug therapy: Secondary | ICD-10-CM | POA: Diagnosis not present

## 2013-10-28 DIAGNOSIS — Z1331 Encounter for screening for depression: Secondary | ICD-10-CM | POA: Diagnosis not present

## 2013-11-29 DIAGNOSIS — M899 Disorder of bone, unspecified: Secondary | ICD-10-CM | POA: Diagnosis not present

## 2013-11-29 DIAGNOSIS — M949 Disorder of cartilage, unspecified: Secondary | ICD-10-CM | POA: Diagnosis not present

## 2013-12-07 DIAGNOSIS — H2589 Other age-related cataract: Secondary | ICD-10-CM | POA: Diagnosis not present

## 2013-12-07 DIAGNOSIS — H2510 Age-related nuclear cataract, unspecified eye: Secondary | ICD-10-CM | POA: Diagnosis not present

## 2013-12-07 DIAGNOSIS — H25019 Cortical age-related cataract, unspecified eye: Secondary | ICD-10-CM | POA: Diagnosis not present

## 2013-12-14 DIAGNOSIS — C679 Malignant neoplasm of bladder, unspecified: Secondary | ICD-10-CM | POA: Diagnosis not present

## 2013-12-14 DIAGNOSIS — Z8551 Personal history of malignant neoplasm of bladder: Secondary | ICD-10-CM | POA: Diagnosis not present

## 2014-01-04 DIAGNOSIS — H2511 Age-related nuclear cataract, right eye: Secondary | ICD-10-CM | POA: Diagnosis not present

## 2014-01-04 DIAGNOSIS — H25811 Combined forms of age-related cataract, right eye: Secondary | ICD-10-CM | POA: Diagnosis not present

## 2014-02-13 DIAGNOSIS — Z23 Encounter for immunization: Secondary | ICD-10-CM | POA: Diagnosis not present

## 2014-11-10 DIAGNOSIS — Z1231 Encounter for screening mammogram for malignant neoplasm of breast: Secondary | ICD-10-CM | POA: Diagnosis not present

## 2014-11-30 DIAGNOSIS — E785 Hyperlipidemia, unspecified: Secondary | ICD-10-CM | POA: Diagnosis not present

## 2014-11-30 DIAGNOSIS — E039 Hypothyroidism, unspecified: Secondary | ICD-10-CM | POA: Diagnosis not present

## 2014-11-30 DIAGNOSIS — N390 Urinary tract infection, site not specified: Secondary | ICD-10-CM | POA: Diagnosis not present

## 2014-11-30 DIAGNOSIS — R7301 Impaired fasting glucose: Secondary | ICD-10-CM | POA: Diagnosis not present

## 2014-11-30 DIAGNOSIS — R8290 Unspecified abnormal findings in urine: Secondary | ICD-10-CM | POA: Diagnosis not present

## 2014-11-30 DIAGNOSIS — I10 Essential (primary) hypertension: Secondary | ICD-10-CM | POA: Diagnosis not present

## 2014-12-05 DIAGNOSIS — Z23 Encounter for immunization: Secondary | ICD-10-CM | POA: Diagnosis not present

## 2014-12-05 DIAGNOSIS — I10 Essential (primary) hypertension: Secondary | ICD-10-CM | POA: Diagnosis not present

## 2014-12-05 DIAGNOSIS — E785 Hyperlipidemia, unspecified: Secondary | ICD-10-CM | POA: Diagnosis not present

## 2014-12-05 DIAGNOSIS — Z0000 Encounter for general adult medical examination without abnormal findings: Secondary | ICD-10-CM | POA: Diagnosis not present

## 2014-12-05 DIAGNOSIS — Z1389 Encounter for screening for other disorder: Secondary | ICD-10-CM | POA: Diagnosis not present

## 2014-12-05 DIAGNOSIS — E039 Hypothyroidism, unspecified: Secondary | ICD-10-CM | POA: Diagnosis not present

## 2014-12-05 DIAGNOSIS — G629 Polyneuropathy, unspecified: Secondary | ICD-10-CM | POA: Diagnosis not present

## 2014-12-05 DIAGNOSIS — C679 Malignant neoplasm of bladder, unspecified: Secondary | ICD-10-CM | POA: Diagnosis not present

## 2014-12-05 DIAGNOSIS — N183 Chronic kidney disease, stage 3 (moderate): Secondary | ICD-10-CM | POA: Diagnosis not present

## 2014-12-05 DIAGNOSIS — R7301 Impaired fasting glucose: Secondary | ICD-10-CM | POA: Diagnosis not present

## 2014-12-05 DIAGNOSIS — F4320 Adjustment disorder, unspecified: Secondary | ICD-10-CM | POA: Diagnosis not present

## 2014-12-19 DIAGNOSIS — Z1212 Encounter for screening for malignant neoplasm of rectum: Secondary | ICD-10-CM | POA: Diagnosis not present

## 2014-12-20 DIAGNOSIS — R8271 Bacteriuria: Secondary | ICD-10-CM | POA: Diagnosis not present

## 2014-12-20 DIAGNOSIS — Z8551 Personal history of malignant neoplasm of bladder: Secondary | ICD-10-CM | POA: Diagnosis not present

## 2015-02-12 DIAGNOSIS — H5211 Myopia, right eye: Secondary | ICD-10-CM | POA: Diagnosis not present

## 2015-02-12 DIAGNOSIS — H52202 Unspecified astigmatism, left eye: Secondary | ICD-10-CM | POA: Diagnosis not present

## 2015-02-12 DIAGNOSIS — Z961 Presence of intraocular lens: Secondary | ICD-10-CM | POA: Diagnosis not present

## 2015-02-12 DIAGNOSIS — H26491 Other secondary cataract, right eye: Secondary | ICD-10-CM | POA: Diagnosis not present

## 2015-09-24 DIAGNOSIS — M25551 Pain in right hip: Secondary | ICD-10-CM | POA: Diagnosis not present

## 2015-09-24 DIAGNOSIS — M9905 Segmental and somatic dysfunction of pelvic region: Secondary | ICD-10-CM | POA: Diagnosis not present

## 2015-09-24 DIAGNOSIS — M9903 Segmental and somatic dysfunction of lumbar region: Secondary | ICD-10-CM | POA: Diagnosis not present

## 2015-09-24 DIAGNOSIS — Q72811 Congenital shortening of right lower limb: Secondary | ICD-10-CM | POA: Diagnosis not present

## 2015-09-24 DIAGNOSIS — M9904 Segmental and somatic dysfunction of sacral region: Secondary | ICD-10-CM | POA: Diagnosis not present

## 2015-09-24 DIAGNOSIS — M5136 Other intervertebral disc degeneration, lumbar region: Secondary | ICD-10-CM | POA: Diagnosis not present

## 2015-09-26 DIAGNOSIS — M9903 Segmental and somatic dysfunction of lumbar region: Secondary | ICD-10-CM | POA: Diagnosis not present

## 2015-09-26 DIAGNOSIS — M5136 Other intervertebral disc degeneration, lumbar region: Secondary | ICD-10-CM | POA: Diagnosis not present

## 2015-09-26 DIAGNOSIS — M9904 Segmental and somatic dysfunction of sacral region: Secondary | ICD-10-CM | POA: Diagnosis not present

## 2015-09-26 DIAGNOSIS — M25551 Pain in right hip: Secondary | ICD-10-CM | POA: Diagnosis not present

## 2015-09-26 DIAGNOSIS — Q72811 Congenital shortening of right lower limb: Secondary | ICD-10-CM | POA: Diagnosis not present

## 2015-09-26 DIAGNOSIS — M9905 Segmental and somatic dysfunction of pelvic region: Secondary | ICD-10-CM | POA: Diagnosis not present

## 2015-09-27 DIAGNOSIS — M25551 Pain in right hip: Secondary | ICD-10-CM | POA: Diagnosis not present

## 2015-09-27 DIAGNOSIS — M9905 Segmental and somatic dysfunction of pelvic region: Secondary | ICD-10-CM | POA: Diagnosis not present

## 2015-09-27 DIAGNOSIS — M5136 Other intervertebral disc degeneration, lumbar region: Secondary | ICD-10-CM | POA: Diagnosis not present

## 2015-09-27 DIAGNOSIS — Q72811 Congenital shortening of right lower limb: Secondary | ICD-10-CM | POA: Diagnosis not present

## 2015-09-27 DIAGNOSIS — M9903 Segmental and somatic dysfunction of lumbar region: Secondary | ICD-10-CM | POA: Diagnosis not present

## 2015-09-27 DIAGNOSIS — M9904 Segmental and somatic dysfunction of sacral region: Secondary | ICD-10-CM | POA: Diagnosis not present

## 2015-10-01 DIAGNOSIS — M9903 Segmental and somatic dysfunction of lumbar region: Secondary | ICD-10-CM | POA: Diagnosis not present

## 2015-10-01 DIAGNOSIS — M9905 Segmental and somatic dysfunction of pelvic region: Secondary | ICD-10-CM | POA: Diagnosis not present

## 2015-10-01 DIAGNOSIS — M25551 Pain in right hip: Secondary | ICD-10-CM | POA: Diagnosis not present

## 2015-10-01 DIAGNOSIS — M9904 Segmental and somatic dysfunction of sacral region: Secondary | ICD-10-CM | POA: Diagnosis not present

## 2015-10-01 DIAGNOSIS — M5136 Other intervertebral disc degeneration, lumbar region: Secondary | ICD-10-CM | POA: Diagnosis not present

## 2015-10-01 DIAGNOSIS — Q72811 Congenital shortening of right lower limb: Secondary | ICD-10-CM | POA: Diagnosis not present

## 2015-10-02 DIAGNOSIS — M9905 Segmental and somatic dysfunction of pelvic region: Secondary | ICD-10-CM | POA: Diagnosis not present

## 2015-10-02 DIAGNOSIS — Q72811 Congenital shortening of right lower limb: Secondary | ICD-10-CM | POA: Diagnosis not present

## 2015-10-02 DIAGNOSIS — M5136 Other intervertebral disc degeneration, lumbar region: Secondary | ICD-10-CM | POA: Diagnosis not present

## 2015-10-02 DIAGNOSIS — M9903 Segmental and somatic dysfunction of lumbar region: Secondary | ICD-10-CM | POA: Diagnosis not present

## 2015-10-02 DIAGNOSIS — M25551 Pain in right hip: Secondary | ICD-10-CM | POA: Diagnosis not present

## 2015-10-02 DIAGNOSIS — M9904 Segmental and somatic dysfunction of sacral region: Secondary | ICD-10-CM | POA: Diagnosis not present

## 2015-10-04 DIAGNOSIS — M25551 Pain in right hip: Secondary | ICD-10-CM | POA: Diagnosis not present

## 2015-10-04 DIAGNOSIS — M9905 Segmental and somatic dysfunction of pelvic region: Secondary | ICD-10-CM | POA: Diagnosis not present

## 2015-10-04 DIAGNOSIS — M5136 Other intervertebral disc degeneration, lumbar region: Secondary | ICD-10-CM | POA: Diagnosis not present

## 2015-10-04 DIAGNOSIS — Q72811 Congenital shortening of right lower limb: Secondary | ICD-10-CM | POA: Diagnosis not present

## 2015-10-04 DIAGNOSIS — M9903 Segmental and somatic dysfunction of lumbar region: Secondary | ICD-10-CM | POA: Diagnosis not present

## 2015-10-04 DIAGNOSIS — M9904 Segmental and somatic dysfunction of sacral region: Secondary | ICD-10-CM | POA: Diagnosis not present

## 2015-10-08 DIAGNOSIS — M9903 Segmental and somatic dysfunction of lumbar region: Secondary | ICD-10-CM | POA: Diagnosis not present

## 2015-10-08 DIAGNOSIS — M9904 Segmental and somatic dysfunction of sacral region: Secondary | ICD-10-CM | POA: Diagnosis not present

## 2015-10-08 DIAGNOSIS — M25551 Pain in right hip: Secondary | ICD-10-CM | POA: Diagnosis not present

## 2015-10-08 DIAGNOSIS — M9905 Segmental and somatic dysfunction of pelvic region: Secondary | ICD-10-CM | POA: Diagnosis not present

## 2015-10-08 DIAGNOSIS — M5136 Other intervertebral disc degeneration, lumbar region: Secondary | ICD-10-CM | POA: Diagnosis not present

## 2015-10-08 DIAGNOSIS — Q72811 Congenital shortening of right lower limb: Secondary | ICD-10-CM | POA: Diagnosis not present

## 2015-10-09 DIAGNOSIS — M9904 Segmental and somatic dysfunction of sacral region: Secondary | ICD-10-CM | POA: Diagnosis not present

## 2015-10-09 DIAGNOSIS — M9903 Segmental and somatic dysfunction of lumbar region: Secondary | ICD-10-CM | POA: Diagnosis not present

## 2015-10-09 DIAGNOSIS — M9905 Segmental and somatic dysfunction of pelvic region: Secondary | ICD-10-CM | POA: Diagnosis not present

## 2015-10-09 DIAGNOSIS — M5136 Other intervertebral disc degeneration, lumbar region: Secondary | ICD-10-CM | POA: Diagnosis not present

## 2015-10-09 DIAGNOSIS — M25551 Pain in right hip: Secondary | ICD-10-CM | POA: Diagnosis not present

## 2015-10-09 DIAGNOSIS — Q72811 Congenital shortening of right lower limb: Secondary | ICD-10-CM | POA: Diagnosis not present

## 2015-10-11 DIAGNOSIS — M9904 Segmental and somatic dysfunction of sacral region: Secondary | ICD-10-CM | POA: Diagnosis not present

## 2015-10-11 DIAGNOSIS — Q72811 Congenital shortening of right lower limb: Secondary | ICD-10-CM | POA: Diagnosis not present

## 2015-10-11 DIAGNOSIS — M5136 Other intervertebral disc degeneration, lumbar region: Secondary | ICD-10-CM | POA: Diagnosis not present

## 2015-10-11 DIAGNOSIS — M9903 Segmental and somatic dysfunction of lumbar region: Secondary | ICD-10-CM | POA: Diagnosis not present

## 2015-10-11 DIAGNOSIS — M9905 Segmental and somatic dysfunction of pelvic region: Secondary | ICD-10-CM | POA: Diagnosis not present

## 2015-10-11 DIAGNOSIS — M25551 Pain in right hip: Secondary | ICD-10-CM | POA: Diagnosis not present

## 2015-10-15 DIAGNOSIS — M25551 Pain in right hip: Secondary | ICD-10-CM | POA: Diagnosis not present

## 2015-10-15 DIAGNOSIS — M5136 Other intervertebral disc degeneration, lumbar region: Secondary | ICD-10-CM | POA: Diagnosis not present

## 2015-10-15 DIAGNOSIS — Q72811 Congenital shortening of right lower limb: Secondary | ICD-10-CM | POA: Diagnosis not present

## 2015-10-15 DIAGNOSIS — M9903 Segmental and somatic dysfunction of lumbar region: Secondary | ICD-10-CM | POA: Diagnosis not present

## 2015-10-15 DIAGNOSIS — M9905 Segmental and somatic dysfunction of pelvic region: Secondary | ICD-10-CM | POA: Diagnosis not present

## 2015-10-15 DIAGNOSIS — M9904 Segmental and somatic dysfunction of sacral region: Secondary | ICD-10-CM | POA: Diagnosis not present

## 2015-10-17 DIAGNOSIS — Q72811 Congenital shortening of right lower limb: Secondary | ICD-10-CM | POA: Diagnosis not present

## 2015-10-17 DIAGNOSIS — M25551 Pain in right hip: Secondary | ICD-10-CM | POA: Diagnosis not present

## 2015-10-17 DIAGNOSIS — M9903 Segmental and somatic dysfunction of lumbar region: Secondary | ICD-10-CM | POA: Diagnosis not present

## 2015-10-17 DIAGNOSIS — M9905 Segmental and somatic dysfunction of pelvic region: Secondary | ICD-10-CM | POA: Diagnosis not present

## 2015-10-17 DIAGNOSIS — M5136 Other intervertebral disc degeneration, lumbar region: Secondary | ICD-10-CM | POA: Diagnosis not present

## 2015-10-17 DIAGNOSIS — M9904 Segmental and somatic dysfunction of sacral region: Secondary | ICD-10-CM | POA: Diagnosis not present

## 2015-10-18 DIAGNOSIS — Q72811 Congenital shortening of right lower limb: Secondary | ICD-10-CM | POA: Diagnosis not present

## 2015-10-18 DIAGNOSIS — M25551 Pain in right hip: Secondary | ICD-10-CM | POA: Diagnosis not present

## 2015-10-18 DIAGNOSIS — M9904 Segmental and somatic dysfunction of sacral region: Secondary | ICD-10-CM | POA: Diagnosis not present

## 2015-10-18 DIAGNOSIS — M9905 Segmental and somatic dysfunction of pelvic region: Secondary | ICD-10-CM | POA: Diagnosis not present

## 2015-10-18 DIAGNOSIS — M5136 Other intervertebral disc degeneration, lumbar region: Secondary | ICD-10-CM | POA: Diagnosis not present

## 2015-10-18 DIAGNOSIS — M9903 Segmental and somatic dysfunction of lumbar region: Secondary | ICD-10-CM | POA: Diagnosis not present

## 2015-10-22 DIAGNOSIS — M9904 Segmental and somatic dysfunction of sacral region: Secondary | ICD-10-CM | POA: Diagnosis not present

## 2015-10-22 DIAGNOSIS — M5136 Other intervertebral disc degeneration, lumbar region: Secondary | ICD-10-CM | POA: Diagnosis not present

## 2015-10-22 DIAGNOSIS — M9903 Segmental and somatic dysfunction of lumbar region: Secondary | ICD-10-CM | POA: Diagnosis not present

## 2015-10-22 DIAGNOSIS — Q72811 Congenital shortening of right lower limb: Secondary | ICD-10-CM | POA: Diagnosis not present

## 2015-10-22 DIAGNOSIS — M25551 Pain in right hip: Secondary | ICD-10-CM | POA: Diagnosis not present

## 2015-10-22 DIAGNOSIS — M9905 Segmental and somatic dysfunction of pelvic region: Secondary | ICD-10-CM | POA: Diagnosis not present

## 2015-10-25 DIAGNOSIS — M9903 Segmental and somatic dysfunction of lumbar region: Secondary | ICD-10-CM | POA: Diagnosis not present

## 2015-10-25 DIAGNOSIS — M9905 Segmental and somatic dysfunction of pelvic region: Secondary | ICD-10-CM | POA: Diagnosis not present

## 2015-10-25 DIAGNOSIS — Q72811 Congenital shortening of right lower limb: Secondary | ICD-10-CM | POA: Diagnosis not present

## 2015-10-25 DIAGNOSIS — M9904 Segmental and somatic dysfunction of sacral region: Secondary | ICD-10-CM | POA: Diagnosis not present

## 2015-10-25 DIAGNOSIS — M25551 Pain in right hip: Secondary | ICD-10-CM | POA: Diagnosis not present

## 2015-10-25 DIAGNOSIS — M5136 Other intervertebral disc degeneration, lumbar region: Secondary | ICD-10-CM | POA: Diagnosis not present

## 2015-10-29 DIAGNOSIS — M9903 Segmental and somatic dysfunction of lumbar region: Secondary | ICD-10-CM | POA: Diagnosis not present

## 2015-10-29 DIAGNOSIS — M9904 Segmental and somatic dysfunction of sacral region: Secondary | ICD-10-CM | POA: Diagnosis not present

## 2015-10-29 DIAGNOSIS — M25551 Pain in right hip: Secondary | ICD-10-CM | POA: Diagnosis not present

## 2015-10-29 DIAGNOSIS — M9905 Segmental and somatic dysfunction of pelvic region: Secondary | ICD-10-CM | POA: Diagnosis not present

## 2015-10-29 DIAGNOSIS — M5136 Other intervertebral disc degeneration, lumbar region: Secondary | ICD-10-CM | POA: Diagnosis not present

## 2015-10-29 DIAGNOSIS — Q72811 Congenital shortening of right lower limb: Secondary | ICD-10-CM | POA: Diagnosis not present

## 2015-11-01 DIAGNOSIS — M25551 Pain in right hip: Secondary | ICD-10-CM | POA: Diagnosis not present

## 2015-11-01 DIAGNOSIS — M9904 Segmental and somatic dysfunction of sacral region: Secondary | ICD-10-CM | POA: Diagnosis not present

## 2015-11-01 DIAGNOSIS — M9903 Segmental and somatic dysfunction of lumbar region: Secondary | ICD-10-CM | POA: Diagnosis not present

## 2015-11-01 DIAGNOSIS — Q72811 Congenital shortening of right lower limb: Secondary | ICD-10-CM | POA: Diagnosis not present

## 2015-11-01 DIAGNOSIS — M5136 Other intervertebral disc degeneration, lumbar region: Secondary | ICD-10-CM | POA: Diagnosis not present

## 2015-11-01 DIAGNOSIS — M9905 Segmental and somatic dysfunction of pelvic region: Secondary | ICD-10-CM | POA: Diagnosis not present

## 2015-11-05 DIAGNOSIS — M5136 Other intervertebral disc degeneration, lumbar region: Secondary | ICD-10-CM | POA: Diagnosis not present

## 2015-11-05 DIAGNOSIS — Q72811 Congenital shortening of right lower limb: Secondary | ICD-10-CM | POA: Diagnosis not present

## 2015-11-05 DIAGNOSIS — M9904 Segmental and somatic dysfunction of sacral region: Secondary | ICD-10-CM | POA: Diagnosis not present

## 2015-11-05 DIAGNOSIS — M9903 Segmental and somatic dysfunction of lumbar region: Secondary | ICD-10-CM | POA: Diagnosis not present

## 2015-11-05 DIAGNOSIS — M25551 Pain in right hip: Secondary | ICD-10-CM | POA: Diagnosis not present

## 2015-11-05 DIAGNOSIS — M9905 Segmental and somatic dysfunction of pelvic region: Secondary | ICD-10-CM | POA: Diagnosis not present

## 2015-11-14 DIAGNOSIS — M5136 Other intervertebral disc degeneration, lumbar region: Secondary | ICD-10-CM | POA: Diagnosis not present

## 2015-11-14 DIAGNOSIS — M9904 Segmental and somatic dysfunction of sacral region: Secondary | ICD-10-CM | POA: Diagnosis not present

## 2015-11-14 DIAGNOSIS — M25551 Pain in right hip: Secondary | ICD-10-CM | POA: Diagnosis not present

## 2015-11-14 DIAGNOSIS — M9905 Segmental and somatic dysfunction of pelvic region: Secondary | ICD-10-CM | POA: Diagnosis not present

## 2015-11-14 DIAGNOSIS — Q72811 Congenital shortening of right lower limb: Secondary | ICD-10-CM | POA: Diagnosis not present

## 2015-11-14 DIAGNOSIS — M9903 Segmental and somatic dysfunction of lumbar region: Secondary | ICD-10-CM | POA: Diagnosis not present

## 2015-11-23 DIAGNOSIS — N183 Chronic kidney disease, stage 3 (moderate): Secondary | ICD-10-CM | POA: Diagnosis not present

## 2015-11-23 DIAGNOSIS — M859 Disorder of bone density and structure, unspecified: Secondary | ICD-10-CM | POA: Diagnosis not present

## 2015-11-23 DIAGNOSIS — E559 Vitamin D deficiency, unspecified: Secondary | ICD-10-CM | POA: Diagnosis not present

## 2015-11-28 DIAGNOSIS — M5136 Other intervertebral disc degeneration, lumbar region: Secondary | ICD-10-CM | POA: Diagnosis not present

## 2015-11-28 DIAGNOSIS — M9903 Segmental and somatic dysfunction of lumbar region: Secondary | ICD-10-CM | POA: Diagnosis not present

## 2015-11-28 DIAGNOSIS — M9904 Segmental and somatic dysfunction of sacral region: Secondary | ICD-10-CM | POA: Diagnosis not present

## 2015-11-28 DIAGNOSIS — Q72811 Congenital shortening of right lower limb: Secondary | ICD-10-CM | POA: Diagnosis not present

## 2015-11-28 DIAGNOSIS — M9905 Segmental and somatic dysfunction of pelvic region: Secondary | ICD-10-CM | POA: Diagnosis not present

## 2015-11-28 DIAGNOSIS — M25551 Pain in right hip: Secondary | ICD-10-CM | POA: Diagnosis not present

## 2015-12-06 DIAGNOSIS — M810 Age-related osteoporosis without current pathological fracture: Secondary | ICD-10-CM | POA: Diagnosis not present

## 2015-12-06 DIAGNOSIS — Z79899 Other long term (current) drug therapy: Secondary | ICD-10-CM | POA: Diagnosis not present

## 2015-12-06 DIAGNOSIS — Z6829 Body mass index (BMI) 29.0-29.9, adult: Secondary | ICD-10-CM | POA: Diagnosis not present

## 2015-12-06 DIAGNOSIS — Z23 Encounter for immunization: Secondary | ICD-10-CM | POA: Diagnosis not present

## 2015-12-14 ENCOUNTER — Other Ambulatory Visit (HOSPITAL_COMMUNITY): Payer: Self-pay | Admitting: *Deleted

## 2015-12-17 ENCOUNTER — Inpatient Hospital Stay (HOSPITAL_COMMUNITY): Admission: RE | Admit: 2015-12-17 | Payer: Self-pay | Source: Ambulatory Visit

## 2015-12-18 DIAGNOSIS — M109 Gout, unspecified: Secondary | ICD-10-CM | POA: Diagnosis not present

## 2015-12-18 DIAGNOSIS — E038 Other specified hypothyroidism: Secondary | ICD-10-CM | POA: Diagnosis not present

## 2015-12-18 DIAGNOSIS — E559 Vitamin D deficiency, unspecified: Secondary | ICD-10-CM | POA: Diagnosis not present

## 2015-12-18 DIAGNOSIS — R8299 Other abnormal findings in urine: Secondary | ICD-10-CM | POA: Diagnosis not present

## 2015-12-18 DIAGNOSIS — R7301 Impaired fasting glucose: Secondary | ICD-10-CM | POA: Diagnosis not present

## 2015-12-18 DIAGNOSIS — E784 Other hyperlipidemia: Secondary | ICD-10-CM | POA: Diagnosis not present

## 2015-12-18 DIAGNOSIS — N183 Chronic kidney disease, stage 3 (moderate): Secondary | ICD-10-CM | POA: Diagnosis not present

## 2015-12-18 DIAGNOSIS — N390 Urinary tract infection, site not specified: Secondary | ICD-10-CM | POA: Diagnosis not present

## 2015-12-19 ENCOUNTER — Ambulatory Visit (HOSPITAL_COMMUNITY)
Admission: RE | Admit: 2015-12-19 | Discharge: 2015-12-19 | Disposition: A | Discharge status: Home / Self Care | Payer: Medicare Other | Source: Ambulatory Visit | Attending: Internal Medicine | Admitting: Internal Medicine

## 2015-12-19 DIAGNOSIS — M810 Age-related osteoporosis without current pathological fracture: Secondary | ICD-10-CM | POA: Insufficient documentation

## 2015-12-19 MED ORDER — DENOSUMAB 60 MG/ML ~~LOC~~ SOLN
60.0000 mg | Freq: Once | SUBCUTANEOUS | Status: AC
  Administered 2015-12-19: 60 mg via SUBCUTANEOUS
  Filled 2015-12-19: qty 1

## 2015-12-25 DIAGNOSIS — Z6830 Body mass index (BMI) 30.0-30.9, adult: Secondary | ICD-10-CM | POA: Diagnosis not present

## 2015-12-25 DIAGNOSIS — Z1231 Encounter for screening mammogram for malignant neoplasm of breast: Secondary | ICD-10-CM | POA: Diagnosis not present

## 2015-12-25 DIAGNOSIS — M810 Age-related osteoporosis without current pathological fracture: Secondary | ICD-10-CM | POA: Diagnosis not present

## 2015-12-25 DIAGNOSIS — N183 Chronic kidney disease, stage 3 (moderate): Secondary | ICD-10-CM | POA: Diagnosis not present

## 2015-12-25 DIAGNOSIS — D7589 Other specified diseases of blood and blood-forming organs: Secondary | ICD-10-CM | POA: Diagnosis not present

## 2015-12-25 DIAGNOSIS — M109 Gout, unspecified: Secondary | ICD-10-CM | POA: Diagnosis not present

## 2015-12-25 DIAGNOSIS — D539 Nutritional anemia, unspecified: Secondary | ICD-10-CM | POA: Diagnosis not present

## 2015-12-25 DIAGNOSIS — R3129 Other microscopic hematuria: Secondary | ICD-10-CM | POA: Diagnosis not present

## 2015-12-25 DIAGNOSIS — Z0000 Encounter for general adult medical examination without abnormal findings: Secondary | ICD-10-CM | POA: Diagnosis not present

## 2015-12-25 DIAGNOSIS — E038 Other specified hypothyroidism: Secondary | ICD-10-CM | POA: Diagnosis not present

## 2015-12-25 DIAGNOSIS — R7301 Impaired fasting glucose: Secondary | ICD-10-CM | POA: Diagnosis not present

## 2015-12-25 DIAGNOSIS — Z1389 Encounter for screening for other disorder: Secondary | ICD-10-CM | POA: Diagnosis not present

## 2015-12-25 DIAGNOSIS — G629 Polyneuropathy, unspecified: Secondary | ICD-10-CM | POA: Diagnosis not present

## 2015-12-26 DIAGNOSIS — C678 Malignant neoplasm of overlapping sites of bladder: Secondary | ICD-10-CM | POA: Diagnosis not present

## 2016-03-19 DIAGNOSIS — L309 Dermatitis, unspecified: Secondary | ICD-10-CM | POA: Diagnosis not present

## 2016-03-19 DIAGNOSIS — Z6830 Body mass index (BMI) 30.0-30.9, adult: Secondary | ICD-10-CM | POA: Diagnosis not present

## 2016-03-19 DIAGNOSIS — M810 Age-related osteoporosis without current pathological fracture: Secondary | ICD-10-CM | POA: Diagnosis not present

## 2016-05-26 DIAGNOSIS — H5213 Myopia, bilateral: Secondary | ICD-10-CM | POA: Diagnosis not present

## 2016-05-26 DIAGNOSIS — D2312 Other benign neoplasm of skin of left eyelid, including canthus: Secondary | ICD-10-CM | POA: Diagnosis not present

## 2016-05-26 DIAGNOSIS — H26493 Other secondary cataract, bilateral: Secondary | ICD-10-CM | POA: Diagnosis not present

## 2016-05-26 DIAGNOSIS — D2311 Other benign neoplasm of skin of right eyelid, including canthus: Secondary | ICD-10-CM | POA: Diagnosis not present

## 2016-06-23 DIAGNOSIS — Z6828 Body mass index (BMI) 28.0-28.9, adult: Secondary | ICD-10-CM | POA: Diagnosis not present

## 2016-06-23 DIAGNOSIS — C679 Malignant neoplasm of bladder, unspecified: Secondary | ICD-10-CM | POA: Diagnosis not present

## 2016-06-23 DIAGNOSIS — E784 Other hyperlipidemia: Secondary | ICD-10-CM | POA: Diagnosis not present

## 2016-06-23 DIAGNOSIS — I10 Essential (primary) hypertension: Secondary | ICD-10-CM | POA: Diagnosis not present

## 2016-06-23 DIAGNOSIS — R7301 Impaired fasting glucose: Secondary | ICD-10-CM | POA: Diagnosis not present

## 2016-06-23 DIAGNOSIS — M810 Age-related osteoporosis without current pathological fracture: Secondary | ICD-10-CM | POA: Diagnosis not present

## 2016-12-31 DIAGNOSIS — C678 Malignant neoplasm of overlapping sites of bladder: Secondary | ICD-10-CM | POA: Diagnosis not present

## 2017-02-04 DIAGNOSIS — R82998 Other abnormal findings in urine: Secondary | ICD-10-CM | POA: Diagnosis not present

## 2017-02-04 DIAGNOSIS — R7301 Impaired fasting glucose: Secondary | ICD-10-CM | POA: Diagnosis not present

## 2017-02-04 DIAGNOSIS — E038 Other specified hypothyroidism: Secondary | ICD-10-CM | POA: Diagnosis not present

## 2017-02-04 DIAGNOSIS — E7849 Other hyperlipidemia: Secondary | ICD-10-CM | POA: Diagnosis not present

## 2017-02-04 DIAGNOSIS — N183 Chronic kidney disease, stage 3 (moderate): Secondary | ICD-10-CM | POA: Diagnosis not present

## 2017-02-04 DIAGNOSIS — M109 Gout, unspecified: Secondary | ICD-10-CM | POA: Diagnosis not present

## 2017-02-04 DIAGNOSIS — E559 Vitamin D deficiency, unspecified: Secondary | ICD-10-CM | POA: Diagnosis not present

## 2017-02-11 DIAGNOSIS — D7589 Other specified diseases of blood and blood-forming organs: Secondary | ICD-10-CM | POA: Diagnosis not present

## 2017-02-11 DIAGNOSIS — C679 Malignant neoplasm of bladder, unspecified: Secondary | ICD-10-CM | POA: Diagnosis not present

## 2017-02-11 DIAGNOSIS — N183 Chronic kidney disease, stage 3 (moderate): Secondary | ICD-10-CM | POA: Diagnosis not present

## 2017-02-11 DIAGNOSIS — Z0000 Encounter for general adult medical examination without abnormal findings: Secondary | ICD-10-CM | POA: Diagnosis not present

## 2017-02-11 DIAGNOSIS — M810 Age-related osteoporosis without current pathological fracture: Secondary | ICD-10-CM | POA: Diagnosis not present

## 2017-02-11 DIAGNOSIS — Z6828 Body mass index (BMI) 28.0-28.9, adult: Secondary | ICD-10-CM | POA: Diagnosis not present

## 2017-02-11 DIAGNOSIS — L308 Other specified dermatitis: Secondary | ICD-10-CM | POA: Diagnosis not present

## 2017-02-11 DIAGNOSIS — R808 Other proteinuria: Secondary | ICD-10-CM | POA: Diagnosis not present

## 2017-02-11 DIAGNOSIS — E038 Other specified hypothyroidism: Secondary | ICD-10-CM | POA: Diagnosis not present

## 2017-02-11 DIAGNOSIS — Z23 Encounter for immunization: Secondary | ICD-10-CM | POA: Diagnosis not present

## 2017-02-11 DIAGNOSIS — R7301 Impaired fasting glucose: Secondary | ICD-10-CM | POA: Diagnosis not present

## 2017-02-11 DIAGNOSIS — F4320 Adjustment disorder, unspecified: Secondary | ICD-10-CM | POA: Diagnosis not present

## 2017-02-11 DIAGNOSIS — Z1389 Encounter for screening for other disorder: Secondary | ICD-10-CM | POA: Diagnosis not present

## 2017-05-26 DIAGNOSIS — H0100B Unspecified blepharitis left eye, upper and lower eyelids: Secondary | ICD-10-CM | POA: Diagnosis not present

## 2017-05-26 DIAGNOSIS — H0100A Unspecified blepharitis right eye, upper and lower eyelids: Secondary | ICD-10-CM | POA: Diagnosis not present

## 2017-05-26 DIAGNOSIS — H5213 Myopia, bilateral: Secondary | ICD-10-CM | POA: Diagnosis not present

## 2017-05-26 DIAGNOSIS — H26493 Other secondary cataract, bilateral: Secondary | ICD-10-CM | POA: Diagnosis not present

## 2017-08-27 DIAGNOSIS — E038 Other specified hypothyroidism: Secondary | ICD-10-CM | POA: Diagnosis not present

## 2017-08-27 DIAGNOSIS — R7301 Impaired fasting glucose: Secondary | ICD-10-CM | POA: Diagnosis not present

## 2017-08-27 DIAGNOSIS — Z6827 Body mass index (BMI) 27.0-27.9, adult: Secondary | ICD-10-CM | POA: Diagnosis not present

## 2017-08-27 DIAGNOSIS — E7849 Other hyperlipidemia: Secondary | ICD-10-CM | POA: Diagnosis not present

## 2017-08-27 DIAGNOSIS — C679 Malignant neoplasm of bladder, unspecified: Secondary | ICD-10-CM | POA: Diagnosis not present

## 2017-08-27 DIAGNOSIS — I10 Essential (primary) hypertension: Secondary | ICD-10-CM | POA: Diagnosis not present

## 2017-10-30 DIAGNOSIS — H26493 Other secondary cataract, bilateral: Secondary | ICD-10-CM | POA: Diagnosis not present

## 2017-11-26 DIAGNOSIS — H26491 Other secondary cataract, right eye: Secondary | ICD-10-CM | POA: Diagnosis not present

## 2017-12-10 DIAGNOSIS — H26492 Other secondary cataract, left eye: Secondary | ICD-10-CM | POA: Diagnosis not present

## 2018-01-05 DIAGNOSIS — Z8551 Personal history of malignant neoplasm of bladder: Secondary | ICD-10-CM | POA: Diagnosis not present

## 2018-03-18 DIAGNOSIS — I10 Essential (primary) hypertension: Secondary | ICD-10-CM | POA: Diagnosis not present

## 2018-03-18 DIAGNOSIS — E038 Other specified hypothyroidism: Secondary | ICD-10-CM | POA: Diagnosis not present

## 2018-03-18 DIAGNOSIS — R7301 Impaired fasting glucose: Secondary | ICD-10-CM | POA: Diagnosis not present

## 2018-03-18 DIAGNOSIS — M109 Gout, unspecified: Secondary | ICD-10-CM | POA: Diagnosis not present

## 2018-03-18 DIAGNOSIS — R82998 Other abnormal findings in urine: Secondary | ICD-10-CM | POA: Diagnosis not present

## 2018-03-18 DIAGNOSIS — E7849 Other hyperlipidemia: Secondary | ICD-10-CM | POA: Diagnosis not present

## 2018-03-18 DIAGNOSIS — M810 Age-related osteoporosis without current pathological fracture: Secondary | ICD-10-CM | POA: Diagnosis not present

## 2018-03-24 DIAGNOSIS — Z6825 Body mass index (BMI) 25.0-25.9, adult: Secondary | ICD-10-CM | POA: Diagnosis not present

## 2018-03-24 DIAGNOSIS — N183 Chronic kidney disease, stage 3 (moderate): Secondary | ICD-10-CM | POA: Diagnosis not present

## 2018-03-24 DIAGNOSIS — M810 Age-related osteoporosis without current pathological fracture: Secondary | ICD-10-CM | POA: Diagnosis not present

## 2018-03-24 DIAGNOSIS — Z1389 Encounter for screening for other disorder: Secondary | ICD-10-CM | POA: Diagnosis not present

## 2018-03-24 DIAGNOSIS — Z23 Encounter for immunization: Secondary | ICD-10-CM | POA: Diagnosis not present

## 2018-03-24 DIAGNOSIS — L308 Other specified dermatitis: Secondary | ICD-10-CM | POA: Diagnosis not present

## 2018-03-24 DIAGNOSIS — R808 Other proteinuria: Secondary | ICD-10-CM | POA: Diagnosis not present

## 2018-03-24 DIAGNOSIS — E038 Other specified hypothyroidism: Secondary | ICD-10-CM | POA: Diagnosis not present

## 2018-03-24 DIAGNOSIS — R7301 Impaired fasting glucose: Secondary | ICD-10-CM | POA: Diagnosis not present

## 2018-03-24 DIAGNOSIS — D7589 Other specified diseases of blood and blood-forming organs: Secondary | ICD-10-CM | POA: Diagnosis not present

## 2018-03-24 DIAGNOSIS — C679 Malignant neoplasm of bladder, unspecified: Secondary | ICD-10-CM | POA: Diagnosis not present

## 2018-03-24 DIAGNOSIS — Z0000 Encounter for general adult medical examination without abnormal findings: Secondary | ICD-10-CM | POA: Diagnosis not present

## 2018-03-24 DIAGNOSIS — F4320 Adjustment disorder, unspecified: Secondary | ICD-10-CM | POA: Diagnosis not present

## 2018-09-29 DIAGNOSIS — M810 Age-related osteoporosis without current pathological fracture: Secondary | ICD-10-CM | POA: Diagnosis not present

## 2018-09-29 DIAGNOSIS — E785 Hyperlipidemia, unspecified: Secondary | ICD-10-CM | POA: Diagnosis not present

## 2018-09-29 DIAGNOSIS — M109 Gout, unspecified: Secondary | ICD-10-CM | POA: Diagnosis not present

## 2018-09-29 DIAGNOSIS — C679 Malignant neoplasm of bladder, unspecified: Secondary | ICD-10-CM | POA: Diagnosis not present

## 2018-09-29 DIAGNOSIS — R7301 Impaired fasting glucose: Secondary | ICD-10-CM | POA: Diagnosis not present

## 2018-09-29 DIAGNOSIS — N183 Chronic kidney disease, stage 3 (moderate): Secondary | ICD-10-CM | POA: Diagnosis not present

## 2018-09-29 DIAGNOSIS — I10 Essential (primary) hypertension: Secondary | ICD-10-CM | POA: Diagnosis not present

## 2018-09-29 DIAGNOSIS — G629 Polyneuropathy, unspecified: Secondary | ICD-10-CM | POA: Diagnosis not present

## 2018-09-29 DIAGNOSIS — E039 Hypothyroidism, unspecified: Secondary | ICD-10-CM | POA: Diagnosis not present

## 2018-10-04 DIAGNOSIS — Z111 Encounter for screening for respiratory tuberculosis: Secondary | ICD-10-CM | POA: Diagnosis not present

## 2018-10-08 ENCOUNTER — Encounter: Payer: Self-pay | Admitting: Nurse Practitioner

## 2018-10-08 ENCOUNTER — Non-Acute Institutional Stay (SKILLED_NURSING_FACILITY): Payer: Medicare Other | Admitting: Nurse Practitioner

## 2018-10-08 DIAGNOSIS — G609 Hereditary and idiopathic neuropathy, unspecified: Secondary | ICD-10-CM

## 2018-10-08 DIAGNOSIS — E039 Hypothyroidism, unspecified: Secondary | ICD-10-CM | POA: Diagnosis not present

## 2018-10-08 DIAGNOSIS — M109 Gout, unspecified: Secondary | ICD-10-CM

## 2018-10-08 DIAGNOSIS — M6281 Muscle weakness (generalized): Secondary | ICD-10-CM | POA: Diagnosis not present

## 2018-10-08 DIAGNOSIS — G629 Polyneuropathy, unspecified: Secondary | ICD-10-CM | POA: Diagnosis not present

## 2018-10-08 DIAGNOSIS — R2681 Unsteadiness on feet: Secondary | ICD-10-CM | POA: Diagnosis not present

## 2018-10-08 DIAGNOSIS — I10 Essential (primary) hypertension: Secondary | ICD-10-CM

## 2018-10-08 DIAGNOSIS — Z7389 Other problems related to life management difficulty: Secondary | ICD-10-CM | POA: Diagnosis not present

## 2018-10-08 DIAGNOSIS — M810 Age-related osteoporosis without current pathological fracture: Secondary | ICD-10-CM | POA: Diagnosis not present

## 2018-10-08 NOTE — Assessment & Plan Note (Signed)
Continue Levothyroxine 152mcg qd, update TSH, CBC/diff

## 2018-10-08 NOTE — Assessment & Plan Note (Signed)
No acute flare ups, continue Colchicine 0.6mg  bid.

## 2018-10-08 NOTE — Progress Notes (Signed)
Location:   S[facility] [place] Room Number: 629 Place of Service:    Provider: Marlana Latus NP  Crist Infante, MD  Patient Care Team: Crist Infante, MD as PCP - General (Internal Medicine) Melvine Julin X, NP as Nurse Practitioner (Internal Medicine)  Extended Emergency Contact Information Primary Emergency Contact: Zahner,[name]  [place] Phone: [phone] Relation: Other  Code Status: DNR Goals of care: Advanced Directive information Advanced Directives 10/08/2018  Does Patient Have a Medical Advance Directive? Yes  Type of Advance Directive Out of facility DNR (pink MOST or yellow form)  Does patient want to make changes to medical advance directive? No - Patient declined  Pre-existing out of facility DNR order (yellow form or pink MOST form) Yellow form placed in chart (order not valid for inpatient use)     Chief Complaint  Patient presents with  . Acute Visit    HPI:  Pt is a 85 y.o. female seen today for an acute visit for clarification of her medications.   Hx of hypothyroidism, taking Levothyroxine 192mg , no TSH located. Peripheral neuropathy, on Lyrica 557mbid prn. Hx of Gout, no active flare ups, on Colchicine 0.6 mg bid. HTN, blood pressure is controlled on Benazepril 1059md.    History reviewed. No pertinent past medical history. History reviewed. No pertinent surgical history.  Allergies  Allergen Reactions  . Actonel [Risedronate Sodium]   . Hct [Hydrochlorothiazide]   . Lipitor [Atorvastatin Calcium]   . Neomycin Other (See Comments)    Unknown "been so long ago, a Dermatologist told me"  . Polysporin [Bacitracin-Polymyxin B]   . Prolia [Denosumab]   . Sulfa Antibiotics   . Zocor [Simvastatin]     Allergies as of 10/08/2018      Reactions   Actonel [risedronate Sodium]    Hct [hydrochlorothiazide]    Lipitor [atorvastatin Calcium]    Neomycin Other (See Comments)   Unknown "been so long ago, a Dermatologist told me"   Polysporin [bacitracin-polymyxin B]    Prolia [denosumab]    Sulfa Antibiotics    Zocor [simvastatin]       Medication List       Accurate as of October 08, 2018 11:59 PM. If you have any questions, ask your nurse or doctor.        Alfalfa 250 MG Tabs Take 1 tablet by mouth daily.   benazepril 10 MG tablet Commonly known as: LOTENSIN Take 10 mg by mouth daily.   Calcium Carb-Cholecalciferol 1000-800 MG-UNIT Tabs Take 1 tablet by mouth daily.   colchicine 0.6 MG tablet Take 0.6 mg by mouth 2 (two) times daily.   CoQ10 100 MG Caps Take 2 capsules by mouth daily.   levothyroxine 50 MCG tablet Commonly known as: SYNTHROID Take 50 mcg by mouth daily before breakfast.   Omega-3 1000 MG Caps Take 4 capsules by mouth daily.   pregabalin 50 MG capsule Commonly known as: LYRICA Take 50 mg by mouth 2 (two) times daily as needed.   vitamin C 1000 MG tablet Take 1,000 mg by mouth daily.   Vitamin D3 25 MCG (1000 UT) Caps Take 1 capsule by mouth 2 (two) times daily.      ROS was provided with assistance of staff Review of Systems  Constitutional: Negative for activity change, appetite change, chills, diaphoresis, fatigue and fever.  HENT: Positive for hearing loss and voice change. Negative for congestion.   Respiratory: Negative for cough, shortness of breath and wheezing.  Cardiovascular: Positive for leg swelling. Negative for palpitations.  Gastrointestinal: Negative for abdominal distention, abdominal pain, constipation, diarrhea, nausea and vomiting.  Genitourinary: Negative for difficulty urinating, dysuria and urgency.  Musculoskeletal: Positive for gait problem.  Skin: Negative for color change.  Neurological: Negative for dizziness, facial asymmetry, speech difficulty, weakness, numbness and headaches.       Memory lapses  Psychiatric/Behavioral: Positive for dysphoric mood. Negative for agitation, behavioral problems, hallucinations and sleep disturbance. The  patient is not nervous/anxious.      There is no immunization history on file for this patient. Pertinent  Health Maintenance Due  Topic Date Due  . DEXA SCAN  01/08/1992  . PNA vac Low Risk Adult (1 of 2 - PCV13) 01/08/1992  . INFLUENZA VACCINE  10/09/2018   No flowsheet data found. Functional Status Survey:    Vitals:   10/08/18 0928  BP: 138/62  Pulse: 90  Resp: 18  Temp: (!) 96.8 F (36 C)  SpO2: 98%  Weight: 129 lb 3.2 oz (58.6 kg)  Height: 5' (1.524 m)   Body mass index is 25.23 kg/m. Physical Exam Vitals signs and nursing note reviewed.  Constitutional:      General: She is not in acute distress.    Appearance: Normal appearance. She is normal weight. She is not ill-appearing, toxic-appearing or diaphoretic.  HENT:     Head: Normocephalic and atraumatic.     Nose: Nose normal.     Mouth/Throat:     Mouth: Mucous membranes are moist.  Eyes:     Extraocular Movements: Extraocular movements intact.     Conjunctiva/sclera: Conjunctivae normal.     Pupils: Pupils are equal, round, and reactive to light.  Neck:     Musculoskeletal: Normal range of motion and neck supple.  Cardiovascular:     Rate and Rhythm: Normal rate and regular rhythm.     Heart sounds: No murmur.  Pulmonary:     Effort: Pulmonary effort is normal.     Breath sounds: No wheezing, rhonchi or rales.  Abdominal:     General: Bowel sounds are normal. There is no distension.     Palpations: Abdomen is soft.     Tenderness: There is no abdominal tenderness. There is no right CVA tenderness, left CVA tenderness, guarding or rebound.  Musculoskeletal:     Right lower leg: Edema present.     Left lower leg: Edema present.     Comments: Trace edema BLE  Skin:    General: Skin is warm and dry.  Neurological:     General: No focal deficit present.     Mental Status: She is alert and oriented to person, place, and time. Mental status is at baseline.     Cranial Nerves: No cranial nerve deficit.      Motor: No weakness.     Coordination: Coordination normal.     Gait: Gait abnormal.  Psychiatric:        Mood and Affect: Mood normal.        Behavior: Behavior normal.        Thought Content: Thought content normal.        Judgment: Judgment normal.     Labs reviewed: No results for input(s): NA, K, CL, CO2, GLUCOSE, BUN, CREATININE, CALCIUM, MG, PHOS in the last 8760 hours. No results for input(s): AST, ALT, ALKPHOS, BILITOT, PROT, ALBUMIN in the last 8760 hours. No results for input(s): WBC, NEUTROABS, HGB, HCT, MCV, PLT in the last 8760 hours.  No  results found for: HGBA1C No results found for: CHOL, HDL, LDLCALC, LDLDIRECT, TRIG, CHOLHDL  Significant Diagnostic Results in last 30 days:  No results found.  Assessment/Plan: Hypothyroidism Continue Levothyroxine 148mg qd, update TSH, CBC/diff  HTN (hypertension) Blood pressure is controlled, continue Enalapril 18mqd, update CMP/eGFR  Gout No acute flare ups, continue Colchicine 0.55m85mid.   Peripheral neuropathy Stable, continue Lyrica 43m74md.      Family/ staff Communication: plan of care reviewed with the patient and charge nurse.   Labs/tests ordered:  CBC/diff, CMP/eGFR, TSH  Time spend 25 mintues

## 2018-10-08 NOTE — Assessment & Plan Note (Signed)
Blood pressure is controlled, continue Enalapril 36m qd, update CMP/eGFR

## 2018-10-08 NOTE — Assessment & Plan Note (Signed)
Stable, continue Lyrica 50mg  bid.

## 2018-10-11 DIAGNOSIS — E785 Hyperlipidemia, unspecified: Secondary | ICD-10-CM | POA: Diagnosis not present

## 2018-10-11 DIAGNOSIS — I10 Essential (primary) hypertension: Secondary | ICD-10-CM | POA: Diagnosis not present

## 2018-10-11 DIAGNOSIS — M6281 Muscle weakness (generalized): Secondary | ICD-10-CM | POA: Diagnosis not present

## 2018-10-11 DIAGNOSIS — M810 Age-related osteoporosis without current pathological fracture: Secondary | ICD-10-CM | POA: Diagnosis not present

## 2018-10-11 DIAGNOSIS — R2681 Unsteadiness on feet: Secondary | ICD-10-CM | POA: Diagnosis not present

## 2018-10-11 DIAGNOSIS — G609 Hereditary and idiopathic neuropathy, unspecified: Secondary | ICD-10-CM | POA: Diagnosis not present

## 2018-10-11 DIAGNOSIS — Z7389 Other problems related to life management difficulty: Secondary | ICD-10-CM | POA: Diagnosis not present

## 2018-10-11 DIAGNOSIS — Z20828 Contact with and (suspected) exposure to other viral communicable diseases: Secondary | ICD-10-CM | POA: Diagnosis not present

## 2018-10-11 DIAGNOSIS — M109 Gout, unspecified: Secondary | ICD-10-CM | POA: Diagnosis not present

## 2018-10-11 DIAGNOSIS — G629 Polyneuropathy, unspecified: Secondary | ICD-10-CM | POA: Diagnosis not present

## 2018-10-12 ENCOUNTER — Non-Acute Institutional Stay (SKILLED_NURSING_FACILITY): Payer: Medicare Other | Admitting: Internal Medicine

## 2018-10-12 DIAGNOSIS — M810 Age-related osteoporosis without current pathological fracture: Secondary | ICD-10-CM | POA: Diagnosis not present

## 2018-10-12 DIAGNOSIS — I10 Essential (primary) hypertension: Secondary | ICD-10-CM

## 2018-10-12 DIAGNOSIS — R2681 Unsteadiness on feet: Secondary | ICD-10-CM | POA: Diagnosis not present

## 2018-10-12 DIAGNOSIS — G609 Hereditary and idiopathic neuropathy, unspecified: Secondary | ICD-10-CM

## 2018-10-12 DIAGNOSIS — Z7389 Other problems related to life management difficulty: Secondary | ICD-10-CM | POA: Diagnosis not present

## 2018-10-12 DIAGNOSIS — E039 Hypothyroidism, unspecified: Secondary | ICD-10-CM | POA: Diagnosis not present

## 2018-10-12 DIAGNOSIS — M109 Gout, unspecified: Secondary | ICD-10-CM

## 2018-10-12 DIAGNOSIS — G629 Polyneuropathy, unspecified: Secondary | ICD-10-CM | POA: Diagnosis not present

## 2018-10-12 DIAGNOSIS — M6281 Muscle weakness (generalized): Secondary | ICD-10-CM | POA: Diagnosis not present

## 2018-10-12 NOTE — Progress Notes (Signed)
Provider: Rene Kocher ,MD   Location:  [facility] Room Number: 785 Place of Service:  SNF (31)  PCP: Crist Infante, MD Patient Care Team: Crist Infante, MD as PCP - General (Internal Medicine) Mast, Man X, NP as Nurse Practitioner (Internal Medicine) Virgie Dad, MD as Consulting Physician (Internal Medicine)  Extended Emergency Contact Information Primary Emergency Contact: Laredo,[name]  Johnnette Litter of [place] Phone: [phone] Relation: Other  Code Status:DNR  Goals of Care: Advanced Directive information Advanced Directives 10/12/2018  Does Patient Have a Medical Advance Directive? Yes  Type of Advance Directive Out of facility DNR (pink MOST or yellow form)  Does patient want to make changes to medical advance directive? No - Patient declined  Pre-existing out of facility DNR order (yellow form or pink MOST form) Yellow form placed in chart (order not valid for inpatient use)      Chief Complaint  Patient presents with  . New Admit To SNF    new admit to facility     HPI: Patient is a 85 y.o. female seen today for admission to SNF for Therapy and possible long term care  Patient has a history of hypertension, gout, hypothyroidism and peripheral neuropathy Is not able to get much detailed history from the patient.  The social worker patient was living by herself. Her son is out of town. She was unable to take care of herself. She also had Fall at home. Plan is for patient to eventually transition to assisted living. Patient did not have any acute complaints.  She did get a little depressed about the need to change.  She was also depressed about one of her son who passed away Few years ago.  No past medical history on file. No past surgical history on file.  has no history on file for tobacco, alcohol, and drug. Social History   Socioeconomic History  . Marital status: Widowed    Spouse name: Not on file  . Number of children:  Not on file  . Years of education: Not on file  . Highest education level: Not on file  Occupational History  . Not on file  Social Needs  . Financial resource strain: Not on file  . Food insecurity    Worry: Not on file    Inability: Not on file  . Transportation needs    Medical: Not on file    Non-medical: Not on file  Tobacco Use  . Smoking status: Not on file  Substance and Sexual Activity  . Alcohol use: Not on file  . Drug use: Not on file  . Sexual activity: Not on file  Lifestyle  . Physical activity    Days per week: Not on file    Minutes per session: Not on file  . Stress: Not on file  Relationships  . Social Herbalist on phone: Not on file    Gets together: Not on file    Attends religious service: Not on file    Active member of club or organization: Not on file    Attends meetings of clubs or organizations: Not on file    Relationship status: Not on file  . Intimate partner violence    Fear of current or ex partner: Not on file    Emotionally abused: Not on file    Physically abused: Not on file    Forced sexual activity: Not on file  Other Topics Concern  . Not on file  Social History Narrative  . Not on file    Functional Status Survey:    No family history on file.  Health Maintenance  Topic Date Due  . TETANUS/TDAP  01/07/1944  . DEXA SCAN  01/08/1992  . PNA vac Low Risk Adult (1 of 2 - PCV13) 01/08/1992  . INFLUENZA VACCINE  10/09/2018    Allergies  Allergen Reactions  . Actonel [Risedronate Sodium]   . Hct [Hydrochlorothiazide]   . Lipitor [Atorvastatin Calcium]   . Neomycin Other (See Comments)    Unknown "been so long ago, a Dermatologist told me"  . Polysporin [Bacitracin-Polymyxin B]   . Prolia [Denosumab]   . Sulfa Antibiotics   . Zocor [Simvastatin]     Outpatient Encounter Medications as of 10/12/2018  Medication Sig  . Alfalfa 500 MG TABS Take 1 tablet by mouth daily.  . Ascorbic Acid (VITAMIN C) 1000 MG  tablet Take 1,000 mg by mouth daily.  . benazepril (LOTENSIN) 10 MG tablet Take 10 mg by mouth daily.  . Calcium Carb-Cholecalciferol 1000-800 MG-UNIT TABS Take 1 tablet by mouth daily.  . Cholecalciferol (VITAMIN D3) 25 MCG (1000 UT) CAPS Take 1 capsule by mouth 2 (two) times daily.  . Coenzyme Q10 (COQ10) 100 MG CAPS Take 2 capsules by mouth daily.  . colchicine 0.6 MG tablet Take 0.6 mg by mouth 2 (two) times daily.  Marland Kitchen levothyroxine (SYNTHROID) 100 MCG tablet Take 100 mcg by mouth once a week. On Wednesday  . levothyroxine (SYNTHROID) 50 MCG tablet Take 50 mcg by mouth daily before breakfast. Sun,Mon,Tue, Thur,Fri,Sat  . Omega-3 1000 MG CAPS Take 4 capsules by mouth daily.  . pregabalin (LYRICA) 50 MG capsule Take 50 mg by mouth 2 (two) times daily as needed.  . [DISCONTINUED] levothyroxine (SYNTHROID) 50 MCG tablet Take 50 mcg by mouth daily before breakfast.  . [DISCONTINUED] Alfalfa 250 MG TABS Take 1 tablet by mouth daily.   No facility-administered encounter medications on file as of 10/12/2018.     Review of Systems  Review of Systems  Constitutional: Negative for activity change, appetite change, chills, diaphoresis, fatigue and fever.  HENT: Negative for mouth sores, postnasal drip, rhinorrhea, sinus pain and sore throat.   Respiratory: Negative for apnea, cough, chest tightness, shortness of breath and wheezing.   Cardiovascular: Negative for chest pain, palpitations and leg swelling.  Gastrointestinal: Negative for abdominal distention, abdominal pain, constipation, diarrhea, nausea and vomiting.  Genitourinary: Negative for dysuria and frequency.  Musculoskeletal: Negative for arthralgias, joint swelling and myalgias.  Skin: Negative for rash.  Neurological: Negative for dizziness, syncope, weakness, light-headedness and numbness.  Psychiatric/Behavioral: Negative for behavioral problems, confusion and sleep disturbance.     Vitals:   10/12/18 0835  BP: 138/64  Pulse: 80   Resp: 18  Temp: 97.8 F (36.6 C)  SpO2: 95%  Weight: 129 lb 3.2 oz (58.6 kg)  Height: 5' (1.524 m)   Body mass index is 25.23 kg/m. Physical Exam  Constitutional:  Well-developed and well-nourished.  HENT:  Head: Normocephalic.  Mouth/Throat: Oropharynx is clear and moist.  Eyes: Pupils are equal, round, and reactive to light.  Neck: Neck supple.  Cardiovascular: Normal rate and normal heart sounds.  No murmur heard. Pulmonary/Chest: Effort normal and breath sounds normal. No respiratory distress. No wheezes. She has no rales.  Abdominal: Soft. Bowel sounds are normal. No distension. There is no tenderness. There is no rebound.  Musculoskeletal: Mild edema Bilateral  Lymphadenopathy: none Neurological: Alert and oriented to person, place,  and time. No Focal Deficits But did have Unsteady Gait  Skin: Skin is warm and dry.  Psychiatric: Normal mood and affect. Behavior is normal. Thought content normal.    Labs reviewed: Basic Metabolic Panel: No results for input(s): NA, K, CL, CO2, GLUCOSE, BUN, CREATININE, CALCIUM, MG, PHOS in the last 8760 hours. Liver Function Tests: No results for input(s): AST, ALT, ALKPHOS, BILITOT, PROT, ALBUMIN in the last 8760 hours. No results for input(s): LIPASE, AMYLASE in the last 8760 hours. No results for input(s): AMMONIA in the last 8760 hours. CBC: No results for input(s): WBC, NEUTROABS, HGB, HCT, MCV, PLT in the last 8760 hours. Cardiac Enzymes: No results for input(s): CKTOTAL, CKMB, CKMBINDEX, TROPONINI in the last 8760 hours. BNP: Invalid input(s): POCBNP No results found for: HGBA1C  No results found for: VITAMINB12 No results found for: FOLATE No results found for: IRON, TIBC, FERRITIN  Imaging and Procedures obtained prior to SNF admission: No results found.  Assessment/Plan Essential hypertension - Plan:  Blood pressure controlled on benazepril Repeat BMP pending  Hypothyroidism,- Plan:  Continue same dose of  Synthroid TSH pending  Idiopathic peripheral neuropathy - Plan:  On Lyrica  Gout, - Plan: On colchicine Cognitive impairment We will check MMSE Unsteady gait Working with therapy   Family/ staff Communication:   Labs/tests ordered:  Total time spent in this patient care encounter was  35_  minutes; greater than 50% of the visit spent counseling patient and staff, reviewing records , Labs and coordinating care for problems addressed at this encounter.

## 2018-10-13 DIAGNOSIS — M109 Gout, unspecified: Secondary | ICD-10-CM | POA: Diagnosis not present

## 2018-10-13 DIAGNOSIS — R2681 Unsteadiness on feet: Secondary | ICD-10-CM | POA: Diagnosis not present

## 2018-10-13 DIAGNOSIS — Z1159 Encounter for screening for other viral diseases: Secondary | ICD-10-CM | POA: Diagnosis not present

## 2018-10-13 DIAGNOSIS — Z7389 Other problems related to life management difficulty: Secondary | ICD-10-CM | POA: Diagnosis not present

## 2018-10-13 DIAGNOSIS — M6281 Muscle weakness (generalized): Secondary | ICD-10-CM | POA: Diagnosis not present

## 2018-10-13 DIAGNOSIS — G629 Polyneuropathy, unspecified: Secondary | ICD-10-CM | POA: Diagnosis not present

## 2018-10-13 DIAGNOSIS — M810 Age-related osteoporosis without current pathological fracture: Secondary | ICD-10-CM | POA: Diagnosis not present

## 2018-10-14 DIAGNOSIS — M810 Age-related osteoporosis without current pathological fracture: Secondary | ICD-10-CM | POA: Diagnosis not present

## 2018-10-14 DIAGNOSIS — G629 Polyneuropathy, unspecified: Secondary | ICD-10-CM | POA: Diagnosis not present

## 2018-10-14 DIAGNOSIS — M6281 Muscle weakness (generalized): Secondary | ICD-10-CM | POA: Diagnosis not present

## 2018-10-14 DIAGNOSIS — Z7389 Other problems related to life management difficulty: Secondary | ICD-10-CM | POA: Diagnosis not present

## 2018-10-14 DIAGNOSIS — M109 Gout, unspecified: Secondary | ICD-10-CM | POA: Diagnosis not present

## 2018-10-14 DIAGNOSIS — R2681 Unsteadiness on feet: Secondary | ICD-10-CM | POA: Diagnosis not present

## 2018-10-15 DIAGNOSIS — G629 Polyneuropathy, unspecified: Secondary | ICD-10-CM | POA: Diagnosis not present

## 2018-10-15 DIAGNOSIS — M109 Gout, unspecified: Secondary | ICD-10-CM | POA: Diagnosis not present

## 2018-10-15 DIAGNOSIS — Z7389 Other problems related to life management difficulty: Secondary | ICD-10-CM | POA: Diagnosis not present

## 2018-10-15 DIAGNOSIS — M6281 Muscle weakness (generalized): Secondary | ICD-10-CM | POA: Diagnosis not present

## 2018-10-15 DIAGNOSIS — R2681 Unsteadiness on feet: Secondary | ICD-10-CM | POA: Diagnosis not present

## 2018-10-15 DIAGNOSIS — M810 Age-related osteoporosis without current pathological fracture: Secondary | ICD-10-CM | POA: Diagnosis not present

## 2018-10-18 DIAGNOSIS — M6281 Muscle weakness (generalized): Secondary | ICD-10-CM | POA: Diagnosis not present

## 2018-10-18 DIAGNOSIS — G629 Polyneuropathy, unspecified: Secondary | ICD-10-CM | POA: Diagnosis not present

## 2018-10-18 DIAGNOSIS — Z7389 Other problems related to life management difficulty: Secondary | ICD-10-CM | POA: Diagnosis not present

## 2018-10-18 DIAGNOSIS — M810 Age-related osteoporosis without current pathological fracture: Secondary | ICD-10-CM | POA: Diagnosis not present

## 2018-10-18 DIAGNOSIS — M109 Gout, unspecified: Secondary | ICD-10-CM | POA: Diagnosis not present

## 2018-10-18 DIAGNOSIS — R2681 Unsteadiness on feet: Secondary | ICD-10-CM | POA: Diagnosis not present

## 2018-10-19 DIAGNOSIS — M810 Age-related osteoporosis without current pathological fracture: Secondary | ICD-10-CM | POA: Diagnosis not present

## 2018-10-19 DIAGNOSIS — Z7389 Other problems related to life management difficulty: Secondary | ICD-10-CM | POA: Diagnosis not present

## 2018-10-19 DIAGNOSIS — R2681 Unsteadiness on feet: Secondary | ICD-10-CM | POA: Diagnosis not present

## 2018-10-19 DIAGNOSIS — M109 Gout, unspecified: Secondary | ICD-10-CM | POA: Diagnosis not present

## 2018-10-19 DIAGNOSIS — M6281 Muscle weakness (generalized): Secondary | ICD-10-CM | POA: Diagnosis not present

## 2018-10-19 DIAGNOSIS — G629 Polyneuropathy, unspecified: Secondary | ICD-10-CM | POA: Diagnosis not present

## 2018-10-20 DIAGNOSIS — Z7389 Other problems related to life management difficulty: Secondary | ICD-10-CM | POA: Diagnosis not present

## 2018-10-20 DIAGNOSIS — Z1159 Encounter for screening for other viral diseases: Secondary | ICD-10-CM | POA: Diagnosis not present

## 2018-10-20 DIAGNOSIS — G629 Polyneuropathy, unspecified: Secondary | ICD-10-CM | POA: Diagnosis not present

## 2018-10-20 DIAGNOSIS — R2681 Unsteadiness on feet: Secondary | ICD-10-CM | POA: Diagnosis not present

## 2018-10-20 DIAGNOSIS — M810 Age-related osteoporosis without current pathological fracture: Secondary | ICD-10-CM | POA: Diagnosis not present

## 2018-10-20 DIAGNOSIS — M109 Gout, unspecified: Secondary | ICD-10-CM | POA: Diagnosis not present

## 2018-10-20 DIAGNOSIS — M6281 Muscle weakness (generalized): Secondary | ICD-10-CM | POA: Diagnosis not present

## 2018-10-25 DIAGNOSIS — Z7389 Other problems related to life management difficulty: Secondary | ICD-10-CM | POA: Diagnosis not present

## 2018-10-25 DIAGNOSIS — Z20828 Contact with and (suspected) exposure to other viral communicable diseases: Secondary | ICD-10-CM | POA: Diagnosis not present

## 2018-10-25 DIAGNOSIS — I10 Essential (primary) hypertension: Secondary | ICD-10-CM | POA: Diagnosis not present

## 2018-10-25 DIAGNOSIS — M6281 Muscle weakness (generalized): Secondary | ICD-10-CM | POA: Diagnosis not present

## 2018-10-25 DIAGNOSIS — M109 Gout, unspecified: Secondary | ICD-10-CM | POA: Diagnosis not present

## 2018-10-25 DIAGNOSIS — R278 Other lack of coordination: Secondary | ICD-10-CM | POA: Diagnosis not present

## 2018-10-25 DIAGNOSIS — E785 Hyperlipidemia, unspecified: Secondary | ICD-10-CM | POA: Diagnosis not present

## 2018-10-25 DIAGNOSIS — R41841 Cognitive communication deficit: Secondary | ICD-10-CM | POA: Diagnosis not present

## 2018-10-25 DIAGNOSIS — G629 Polyneuropathy, unspecified: Secondary | ICD-10-CM | POA: Diagnosis not present

## 2018-10-25 DIAGNOSIS — G609 Hereditary and idiopathic neuropathy, unspecified: Secondary | ICD-10-CM | POA: Diagnosis not present

## 2018-10-25 DIAGNOSIS — R2681 Unsteadiness on feet: Secondary | ICD-10-CM | POA: Diagnosis not present

## 2018-10-26 DIAGNOSIS — R41841 Cognitive communication deficit: Secondary | ICD-10-CM | POA: Diagnosis not present

## 2018-10-26 DIAGNOSIS — G629 Polyneuropathy, unspecified: Secondary | ICD-10-CM | POA: Diagnosis not present

## 2018-10-26 DIAGNOSIS — R278 Other lack of coordination: Secondary | ICD-10-CM | POA: Diagnosis not present

## 2018-10-26 DIAGNOSIS — R2681 Unsteadiness on feet: Secondary | ICD-10-CM | POA: Diagnosis not present

## 2018-10-26 DIAGNOSIS — G609 Hereditary and idiopathic neuropathy, unspecified: Secondary | ICD-10-CM | POA: Diagnosis not present

## 2018-10-26 DIAGNOSIS — M6281 Muscle weakness (generalized): Secondary | ICD-10-CM | POA: Diagnosis not present

## 2018-10-27 DIAGNOSIS — R2681 Unsteadiness on feet: Secondary | ICD-10-CM | POA: Diagnosis not present

## 2018-10-27 DIAGNOSIS — R41841 Cognitive communication deficit: Secondary | ICD-10-CM | POA: Diagnosis not present

## 2018-10-27 DIAGNOSIS — M6281 Muscle weakness (generalized): Secondary | ICD-10-CM | POA: Diagnosis not present

## 2018-10-27 DIAGNOSIS — R278 Other lack of coordination: Secondary | ICD-10-CM | POA: Diagnosis not present

## 2018-10-27 DIAGNOSIS — G629 Polyneuropathy, unspecified: Secondary | ICD-10-CM | POA: Diagnosis not present

## 2018-10-27 DIAGNOSIS — G609 Hereditary and idiopathic neuropathy, unspecified: Secondary | ICD-10-CM | POA: Diagnosis not present

## 2018-10-29 DIAGNOSIS — G629 Polyneuropathy, unspecified: Secondary | ICD-10-CM | POA: Diagnosis not present

## 2018-10-29 DIAGNOSIS — R2681 Unsteadiness on feet: Secondary | ICD-10-CM | POA: Diagnosis not present

## 2018-10-29 DIAGNOSIS — G609 Hereditary and idiopathic neuropathy, unspecified: Secondary | ICD-10-CM | POA: Diagnosis not present

## 2018-10-29 DIAGNOSIS — R41841 Cognitive communication deficit: Secondary | ICD-10-CM | POA: Diagnosis not present

## 2018-10-29 DIAGNOSIS — M6281 Muscle weakness (generalized): Secondary | ICD-10-CM | POA: Diagnosis not present

## 2018-10-29 DIAGNOSIS — R278 Other lack of coordination: Secondary | ICD-10-CM | POA: Diagnosis not present

## 2018-11-01 DIAGNOSIS — G629 Polyneuropathy, unspecified: Secondary | ICD-10-CM | POA: Diagnosis not present

## 2018-11-01 DIAGNOSIS — R278 Other lack of coordination: Secondary | ICD-10-CM | POA: Diagnosis not present

## 2018-11-01 DIAGNOSIS — M6281 Muscle weakness (generalized): Secondary | ICD-10-CM | POA: Diagnosis not present

## 2018-11-01 DIAGNOSIS — R2681 Unsteadiness on feet: Secondary | ICD-10-CM | POA: Diagnosis not present

## 2018-11-01 DIAGNOSIS — R41841 Cognitive communication deficit: Secondary | ICD-10-CM | POA: Diagnosis not present

## 2018-11-01 DIAGNOSIS — G609 Hereditary and idiopathic neuropathy, unspecified: Secondary | ICD-10-CM | POA: Diagnosis not present

## 2018-11-02 DIAGNOSIS — G629 Polyneuropathy, unspecified: Secondary | ICD-10-CM | POA: Diagnosis not present

## 2018-11-02 DIAGNOSIS — M6281 Muscle weakness (generalized): Secondary | ICD-10-CM | POA: Diagnosis not present

## 2018-11-02 DIAGNOSIS — R41841 Cognitive communication deficit: Secondary | ICD-10-CM | POA: Diagnosis not present

## 2018-11-02 DIAGNOSIS — R278 Other lack of coordination: Secondary | ICD-10-CM | POA: Diagnosis not present

## 2018-11-02 DIAGNOSIS — R2681 Unsteadiness on feet: Secondary | ICD-10-CM | POA: Diagnosis not present

## 2018-11-02 DIAGNOSIS — G609 Hereditary and idiopathic neuropathy, unspecified: Secondary | ICD-10-CM | POA: Diagnosis not present

## 2018-11-03 DIAGNOSIS — G609 Hereditary and idiopathic neuropathy, unspecified: Secondary | ICD-10-CM | POA: Diagnosis not present

## 2018-11-03 DIAGNOSIS — G629 Polyneuropathy, unspecified: Secondary | ICD-10-CM | POA: Diagnosis not present

## 2018-11-03 DIAGNOSIS — R2681 Unsteadiness on feet: Secondary | ICD-10-CM | POA: Diagnosis not present

## 2018-11-03 DIAGNOSIS — R278 Other lack of coordination: Secondary | ICD-10-CM | POA: Diagnosis not present

## 2018-11-03 DIAGNOSIS — R41841 Cognitive communication deficit: Secondary | ICD-10-CM | POA: Diagnosis not present

## 2018-11-03 DIAGNOSIS — M6281 Muscle weakness (generalized): Secondary | ICD-10-CM | POA: Diagnosis not present

## 2018-11-04 DIAGNOSIS — G609 Hereditary and idiopathic neuropathy, unspecified: Secondary | ICD-10-CM | POA: Diagnosis not present

## 2018-11-04 DIAGNOSIS — R2681 Unsteadiness on feet: Secondary | ICD-10-CM | POA: Diagnosis not present

## 2018-11-04 DIAGNOSIS — R41841 Cognitive communication deficit: Secondary | ICD-10-CM | POA: Diagnosis not present

## 2018-11-04 DIAGNOSIS — G629 Polyneuropathy, unspecified: Secondary | ICD-10-CM | POA: Diagnosis not present

## 2018-11-04 DIAGNOSIS — R278 Other lack of coordination: Secondary | ICD-10-CM | POA: Diagnosis not present

## 2018-11-04 DIAGNOSIS — M6281 Muscle weakness (generalized): Secondary | ICD-10-CM | POA: Diagnosis not present

## 2018-11-08 DIAGNOSIS — R278 Other lack of coordination: Secondary | ICD-10-CM | POA: Diagnosis not present

## 2018-11-08 DIAGNOSIS — M6281 Muscle weakness (generalized): Secondary | ICD-10-CM | POA: Diagnosis not present

## 2018-11-08 DIAGNOSIS — G629 Polyneuropathy, unspecified: Secondary | ICD-10-CM | POA: Diagnosis not present

## 2018-11-08 DIAGNOSIS — R41841 Cognitive communication deficit: Secondary | ICD-10-CM | POA: Diagnosis not present

## 2018-11-08 DIAGNOSIS — G609 Hereditary and idiopathic neuropathy, unspecified: Secondary | ICD-10-CM | POA: Diagnosis not present

## 2018-11-08 DIAGNOSIS — R2681 Unsteadiness on feet: Secondary | ICD-10-CM | POA: Diagnosis not present

## 2018-11-09 DIAGNOSIS — Z20828 Contact with and (suspected) exposure to other viral communicable diseases: Secondary | ICD-10-CM | POA: Diagnosis not present

## 2018-11-09 DIAGNOSIS — E785 Hyperlipidemia, unspecified: Secondary | ICD-10-CM | POA: Diagnosis not present

## 2018-11-09 DIAGNOSIS — G629 Polyneuropathy, unspecified: Secondary | ICD-10-CM | POA: Diagnosis not present

## 2018-11-09 DIAGNOSIS — R278 Other lack of coordination: Secondary | ICD-10-CM | POA: Diagnosis not present

## 2018-11-09 DIAGNOSIS — M6281 Muscle weakness (generalized): Secondary | ICD-10-CM | POA: Diagnosis not present

## 2018-11-09 DIAGNOSIS — R41841 Cognitive communication deficit: Secondary | ICD-10-CM | POA: Diagnosis not present

## 2018-11-09 DIAGNOSIS — I10 Essential (primary) hypertension: Secondary | ICD-10-CM | POA: Diagnosis not present

## 2018-11-09 DIAGNOSIS — R2681 Unsteadiness on feet: Secondary | ICD-10-CM | POA: Diagnosis not present

## 2018-11-09 DIAGNOSIS — Z7389 Other problems related to life management difficulty: Secondary | ICD-10-CM | POA: Diagnosis not present

## 2018-11-11 DIAGNOSIS — G629 Polyneuropathy, unspecified: Secondary | ICD-10-CM | POA: Diagnosis not present

## 2018-11-11 DIAGNOSIS — M6281 Muscle weakness (generalized): Secondary | ICD-10-CM | POA: Diagnosis not present

## 2018-11-11 DIAGNOSIS — R41841 Cognitive communication deficit: Secondary | ICD-10-CM | POA: Diagnosis not present

## 2018-11-11 DIAGNOSIS — I10 Essential (primary) hypertension: Secondary | ICD-10-CM | POA: Diagnosis not present

## 2018-11-11 DIAGNOSIS — R278 Other lack of coordination: Secondary | ICD-10-CM | POA: Diagnosis not present

## 2018-11-11 DIAGNOSIS — R2681 Unsteadiness on feet: Secondary | ICD-10-CM | POA: Diagnosis not present

## 2018-11-16 DIAGNOSIS — G629 Polyneuropathy, unspecified: Secondary | ICD-10-CM | POA: Diagnosis not present

## 2018-11-16 DIAGNOSIS — R278 Other lack of coordination: Secondary | ICD-10-CM | POA: Diagnosis not present

## 2018-11-16 DIAGNOSIS — R41841 Cognitive communication deficit: Secondary | ICD-10-CM | POA: Diagnosis not present

## 2018-11-16 DIAGNOSIS — M6281 Muscle weakness (generalized): Secondary | ICD-10-CM | POA: Diagnosis not present

## 2018-11-16 DIAGNOSIS — R2681 Unsteadiness on feet: Secondary | ICD-10-CM | POA: Diagnosis not present

## 2018-11-16 DIAGNOSIS — I10 Essential (primary) hypertension: Secondary | ICD-10-CM | POA: Diagnosis not present

## 2018-11-18 DIAGNOSIS — G629 Polyneuropathy, unspecified: Secondary | ICD-10-CM | POA: Diagnosis not present

## 2018-11-18 DIAGNOSIS — R41841 Cognitive communication deficit: Secondary | ICD-10-CM | POA: Diagnosis not present

## 2018-11-18 DIAGNOSIS — R2681 Unsteadiness on feet: Secondary | ICD-10-CM | POA: Diagnosis not present

## 2018-11-18 DIAGNOSIS — M6281 Muscle weakness (generalized): Secondary | ICD-10-CM | POA: Diagnosis not present

## 2018-11-18 DIAGNOSIS — R278 Other lack of coordination: Secondary | ICD-10-CM | POA: Diagnosis not present

## 2018-11-18 DIAGNOSIS — I10 Essential (primary) hypertension: Secondary | ICD-10-CM | POA: Diagnosis not present

## 2018-11-19 DIAGNOSIS — I10 Essential (primary) hypertension: Secondary | ICD-10-CM | POA: Diagnosis not present

## 2018-11-19 DIAGNOSIS — R2681 Unsteadiness on feet: Secondary | ICD-10-CM | POA: Diagnosis not present

## 2018-11-19 DIAGNOSIS — R278 Other lack of coordination: Secondary | ICD-10-CM | POA: Diagnosis not present

## 2018-11-19 DIAGNOSIS — R41841 Cognitive communication deficit: Secondary | ICD-10-CM | POA: Diagnosis not present

## 2018-11-19 DIAGNOSIS — M6281 Muscle weakness (generalized): Secondary | ICD-10-CM | POA: Diagnosis not present

## 2018-11-19 DIAGNOSIS — G629 Polyneuropathy, unspecified: Secondary | ICD-10-CM | POA: Diagnosis not present

## 2018-11-22 DIAGNOSIS — G629 Polyneuropathy, unspecified: Secondary | ICD-10-CM | POA: Diagnosis not present

## 2018-11-22 DIAGNOSIS — R41841 Cognitive communication deficit: Secondary | ICD-10-CM | POA: Diagnosis not present

## 2018-11-22 DIAGNOSIS — R2681 Unsteadiness on feet: Secondary | ICD-10-CM | POA: Diagnosis not present

## 2018-11-22 DIAGNOSIS — M6281 Muscle weakness (generalized): Secondary | ICD-10-CM | POA: Diagnosis not present

## 2018-11-22 DIAGNOSIS — R278 Other lack of coordination: Secondary | ICD-10-CM | POA: Diagnosis not present

## 2018-11-22 DIAGNOSIS — I10 Essential (primary) hypertension: Secondary | ICD-10-CM | POA: Diagnosis not present

## 2018-11-23 DIAGNOSIS — R278 Other lack of coordination: Secondary | ICD-10-CM | POA: Diagnosis not present

## 2018-11-23 DIAGNOSIS — G629 Polyneuropathy, unspecified: Secondary | ICD-10-CM | POA: Diagnosis not present

## 2018-11-23 DIAGNOSIS — I10 Essential (primary) hypertension: Secondary | ICD-10-CM | POA: Diagnosis not present

## 2018-11-23 DIAGNOSIS — R2681 Unsteadiness on feet: Secondary | ICD-10-CM | POA: Diagnosis not present

## 2018-11-23 DIAGNOSIS — R41841 Cognitive communication deficit: Secondary | ICD-10-CM | POA: Diagnosis not present

## 2018-11-23 DIAGNOSIS — M6281 Muscle weakness (generalized): Secondary | ICD-10-CM | POA: Diagnosis not present

## 2018-11-24 DIAGNOSIS — I10 Essential (primary) hypertension: Secondary | ICD-10-CM | POA: Diagnosis not present

## 2018-11-24 DIAGNOSIS — G629 Polyneuropathy, unspecified: Secondary | ICD-10-CM | POA: Diagnosis not present

## 2018-11-24 DIAGNOSIS — R2681 Unsteadiness on feet: Secondary | ICD-10-CM | POA: Diagnosis not present

## 2018-11-24 DIAGNOSIS — R41841 Cognitive communication deficit: Secondary | ICD-10-CM | POA: Diagnosis not present

## 2018-11-24 DIAGNOSIS — M6281 Muscle weakness (generalized): Secondary | ICD-10-CM | POA: Diagnosis not present

## 2018-11-24 DIAGNOSIS — R278 Other lack of coordination: Secondary | ICD-10-CM | POA: Diagnosis not present

## 2018-11-25 DIAGNOSIS — M6281 Muscle weakness (generalized): Secondary | ICD-10-CM | POA: Diagnosis not present

## 2018-11-25 DIAGNOSIS — R41841 Cognitive communication deficit: Secondary | ICD-10-CM | POA: Diagnosis not present

## 2018-11-25 DIAGNOSIS — R2681 Unsteadiness on feet: Secondary | ICD-10-CM | POA: Diagnosis not present

## 2018-11-25 DIAGNOSIS — G629 Polyneuropathy, unspecified: Secondary | ICD-10-CM | POA: Diagnosis not present

## 2018-11-25 DIAGNOSIS — I10 Essential (primary) hypertension: Secondary | ICD-10-CM | POA: Diagnosis not present

## 2018-11-25 DIAGNOSIS — R278 Other lack of coordination: Secondary | ICD-10-CM | POA: Diagnosis not present

## 2018-11-30 DIAGNOSIS — G629 Polyneuropathy, unspecified: Secondary | ICD-10-CM | POA: Diagnosis not present

## 2018-11-30 DIAGNOSIS — R41841 Cognitive communication deficit: Secondary | ICD-10-CM | POA: Diagnosis not present

## 2018-11-30 DIAGNOSIS — R278 Other lack of coordination: Secondary | ICD-10-CM | POA: Diagnosis not present

## 2018-11-30 DIAGNOSIS — M6281 Muscle weakness (generalized): Secondary | ICD-10-CM | POA: Diagnosis not present

## 2018-11-30 DIAGNOSIS — I10 Essential (primary) hypertension: Secondary | ICD-10-CM | POA: Diagnosis not present

## 2018-11-30 DIAGNOSIS — R2681 Unsteadiness on feet: Secondary | ICD-10-CM | POA: Diagnosis not present

## 2018-12-03 DIAGNOSIS — R278 Other lack of coordination: Secondary | ICD-10-CM | POA: Diagnosis not present

## 2018-12-03 DIAGNOSIS — G629 Polyneuropathy, unspecified: Secondary | ICD-10-CM | POA: Diagnosis not present

## 2018-12-03 DIAGNOSIS — R41841 Cognitive communication deficit: Secondary | ICD-10-CM | POA: Diagnosis not present

## 2018-12-03 DIAGNOSIS — M6281 Muscle weakness (generalized): Secondary | ICD-10-CM | POA: Diagnosis not present

## 2018-12-03 DIAGNOSIS — R2681 Unsteadiness on feet: Secondary | ICD-10-CM | POA: Diagnosis not present

## 2018-12-03 DIAGNOSIS — I10 Essential (primary) hypertension: Secondary | ICD-10-CM | POA: Diagnosis not present

## 2018-12-06 DIAGNOSIS — M6281 Muscle weakness (generalized): Secondary | ICD-10-CM | POA: Diagnosis not present

## 2018-12-06 DIAGNOSIS — R278 Other lack of coordination: Secondary | ICD-10-CM | POA: Diagnosis not present

## 2018-12-06 DIAGNOSIS — G629 Polyneuropathy, unspecified: Secondary | ICD-10-CM | POA: Diagnosis not present

## 2018-12-06 DIAGNOSIS — I10 Essential (primary) hypertension: Secondary | ICD-10-CM | POA: Diagnosis not present

## 2018-12-06 DIAGNOSIS — R41841 Cognitive communication deficit: Secondary | ICD-10-CM | POA: Diagnosis not present

## 2018-12-06 DIAGNOSIS — R2681 Unsteadiness on feet: Secondary | ICD-10-CM | POA: Diagnosis not present

## 2018-12-08 DIAGNOSIS — R2681 Unsteadiness on feet: Secondary | ICD-10-CM | POA: Diagnosis not present

## 2018-12-08 DIAGNOSIS — I10 Essential (primary) hypertension: Secondary | ICD-10-CM | POA: Diagnosis not present

## 2018-12-08 DIAGNOSIS — M6281 Muscle weakness (generalized): Secondary | ICD-10-CM | POA: Diagnosis not present

## 2018-12-08 DIAGNOSIS — G629 Polyneuropathy, unspecified: Secondary | ICD-10-CM | POA: Diagnosis not present

## 2018-12-08 DIAGNOSIS — R41841 Cognitive communication deficit: Secondary | ICD-10-CM | POA: Diagnosis not present

## 2018-12-08 DIAGNOSIS — R278 Other lack of coordination: Secondary | ICD-10-CM | POA: Diagnosis not present

## 2018-12-30 ENCOUNTER — Encounter: Payer: Self-pay | Admitting: Internal Medicine

## 2018-12-30 ENCOUNTER — Non-Acute Institutional Stay: Payer: Medicare Other | Admitting: Internal Medicine

## 2018-12-30 DIAGNOSIS — M109 Gout, unspecified: Secondary | ICD-10-CM

## 2018-12-30 DIAGNOSIS — E039 Hypothyroidism, unspecified: Secondary | ICD-10-CM | POA: Diagnosis not present

## 2018-12-30 DIAGNOSIS — I10 Essential (primary) hypertension: Secondary | ICD-10-CM | POA: Diagnosis not present

## 2018-12-30 DIAGNOSIS — G609 Hereditary and idiopathic neuropathy, unspecified: Secondary | ICD-10-CM | POA: Diagnosis not present

## 2018-12-30 NOTE — Progress Notes (Signed)
[facility] Room Number: [id number] Place of Service:  ALF [phone]) Provider: Veleta Miners MD  Crist Infante, MD  Patient Care Team: Crist Infante, MD as PCP - General (Internal Medicine) Mast, Man X, NP as Nurse Practitioner (Internal Medicine) Virgie Dad, MD as Consulting Physician (Internal Medicine)  Extended Emergency Contact Information Primary Emergency Contact: Jeffus,[name]  Johnnette Litter of [place] Phone: [phone] Relation: Other  Code Status:  DNR Goals of care: Advanced Directive information Advanced Directives 10/12/2018  Does Patient Have a Medical Advance Directive? Yes  Type of Advance Directive Out of facility DNR (pink MOST or yellow form)  Does patient want to make changes to medical advance directive? No - Patient declined  Pre-existing out of facility DNR order (yellow form or pink MOST form) Yellow form placed in chart (order not valid for inpatient use)     Chief Complaint  Patient presents with  . Medical Management of Chronic Issues    HPI:  Pt is a 85 y.o. female seen today for medical management of chronic diseases.   Patient has a history of hypertension, gout, hypothyroidism and peripheral neuropathy ? Vit D Def and Osteoporosis  Patient  not able to give much detailed history  [place] was moved to [place] from [place] home as she was unable to take care of herself . She also had fall in the home. Her Son who lives out of town was unable to take care of her. She has adjusted well to facility Appetite is fair. No Recent falls. No New nursing issues Has lost few pounds .  Past Medical History:  Diagnosis Date  . Cognitive changes   . Depression   . Gout   . Hypertension   . Hypothyroidism   . Peripheral neuropathy    History reviewed. No pertinent surgical history.  Allergies  Allergen Reactions  . Actonel [Risedronate Sodium]   . Hct [Hydrochlorothiazide]   . Lipitor [Atorvastatin Calcium]   . Neomycin Other (See Comments)   Unknown "been so long ago, a Dermatologist told me"  . Polysporin [Bacitracin-Polymyxin B]   . Prolia [Denosumab]   . Sulfa Antibiotics   . Zocor [Simvastatin]     Allergies as of 12/30/2018      Reactions   Actonel [risedronate Sodium]    Hct [hydrochlorothiazide]    Lipitor [atorvastatin Calcium]    Neomycin Other (See Comments)   Unknown "been so long ago, a Dermatologist told me"   Polysporin [bacitracin-polymyxin B]    Prolia [denosumab]    Sulfa Antibiotics    Zocor [simvastatin]       Medication List       Accurate as of December 30, 2018 11:59 PM. If you have any questions, ask your nurse or doctor.        Alfalfa 500 MG Tabs Take 1 tablet by mouth daily.   benazepril 10 MG tablet Commonly known as: LOTENSIN Take 10 mg by mouth daily.   Calcium Carb-Cholecalciferol 1000-800 MG-UNIT Tabs Take 1 tablet by mouth daily.   colchicine 0.6 MG tablet Take 0.6 mg by mouth 2 (two) times daily.   CoQ10 100 MG Caps Take 2 capsules by mouth daily.   levothyroxine 100 MCG tablet Commonly known as: SYNTHROID Take 100 mcg by mouth once a week. On Wednesday   levothyroxine 50 MCG tablet Commonly known as: SYNTHROID Take 50 mcg by mouth daily before breakfast. Sun,Mon,Tue, Thur,Fri,Sat   Omega-3 1000 MG Caps Take 4 capsules by mouth daily.  pregabalin 50 MG capsule Commonly known as: LYRICA Take 50 mg by mouth 2 (two) times daily as needed.   vitamin C 1000 MG tablet Take 1,000 mg by mouth daily.   Vitamin D3 25 MCG (1000 UT) Caps Take 1 capsule by mouth 2 (two) times daily.       Review of Systems  Review of Systems  Constitutional: Negative for activity change, appetite change, chills, diaphoresis, fatigue and fever.  HENT: Negative for mouth sores, postnasal drip, rhinorrhea, sinus pain and sore throat.   Respiratory: Negative for apnea, cough, chest tightness, shortness of breath and wheezing.   Cardiovascular: Negative for chest pain, palpitations  and leg swelling.  Gastrointestinal: Negative for abdominal distention, abdominal pain, constipation, diarrhea, nausea and vomiting.  Genitourinary: Negative for dysuria and frequency.  Musculoskeletal: Negative for arthralgias, joint swelling and myalgias.  Skin: Negative for rash.  Neurological: Negative for dizziness, syncope, weakness, light-headedness and numbness.  Psychiatric/Behavioral: Negative for behavioral problems, confusion and sleep disturbance.     Immunization History  Administered Date(s) Administered  . Influenza-Unspecified 03/24/2018   Pertinent  Health Maintenance Due  Topic Date Due  . DEXA SCAN  01/08/1992  . PNA vac Low Risk Adult (1 of 2 - PCV13) 01/08/1992  . INFLUENZA VACCINE  10/09/2018   No flowsheet data found. Functional Status Survey:    Vitals:   12/30/18 1005  BP: 118/60  Pulse: 76  Resp: 20  Temp: (!) 97.3 F (36.3 C)  SpO2: 98%  Weight: 127 lb 9.6 oz (57.9 kg)  Height: 5' (1.524 m)   Body mass index is 24.92 kg/m. Physical Exam  Constitutional:  Well-developed and well-nourished.  HENT:  Head: Normocephalic.  Mouth/Throat: Oropharynx is clear and moist.  Eyes: Pupils are equal, round, and reactive to light.  Neck: Neck supple.  Cardiovascular: Normal rate and normal heart sounds.  No murmur heard. Pulmonary/Chest: Effort normal and breath sounds normal. No respiratory distress. No wheezes. She has no rales.  Abdominal: Soft. Bowel sounds are normal. No distension. There is no tenderness. There is no rebound.  Musculoskeletal: No edema.  Lymphadenopathy: none Neurological: No Focal deficits. Walks with the walker  Skin: Skin is warm and dry.  Psychiatric: Normal mood and affect.Seems Mildily Derpressed Behavior is normal. Thought content normal.    Labs reviewed: No results for input(s): NA, K, CL, CO2, GLUCOSE, BUN, CREATININE, CALCIUM, MG, PHOS in the last 8760 hours. No results for input(s): AST, ALT, ALKPHOS, BILITOT,  PROT, ALBUMIN in the last 8760 hours. No results for input(s): WBC, NEUTROABS, HGB, HCT, MCV, PLT in the last 8760 hours.  No results found for: HGBA1C No results found for: CHOL, HDL, LDLCALC, LDLDIRECT, TRIG, CHOLHDL  Significant Diagnostic Results in last 30 days:  No results found.  Assessment/Plan  Essential hypertension - Plan:  BP stable in facility On Lisinopril Repeat BMP done here showed stable BUN and creat Hypothyroidism,- Plan:  Continue same dose of Synthroid Repeat TSH was Normal in 8/20 Idiopathic peripheral neuropathy - Plan:  On Lyrica Gout, - Plan: Continue on Colchicine Cognitive impairment We will check MMSE Unsteady gait Walks with the walker with no recent falls    CMP ,CBC and TSH were Normal in 8/20 Need update of her Immunization  Also Updated DEXA scan as it seems she had h/o Osteoporosis before Vit D level  Family/ staff Communication:   Labs/tests ordered:   Total time spent in this patient care encounter was  25_  minutes; greater than 50%  of the visit spent counseling patient and staff, reviewing records , Labs and coordinating care for problems addressed at this encounter.

## 2018-12-31 ENCOUNTER — Encounter: Payer: Self-pay | Admitting: Internal Medicine

## 2019-01-11 DIAGNOSIS — Z20828 Contact with and (suspected) exposure to other viral communicable diseases: Secondary | ICD-10-CM | POA: Diagnosis not present

## 2019-01-18 DIAGNOSIS — Z03818 Encounter for observation for suspected exposure to other biological agents ruled out: Secondary | ICD-10-CM | POA: Diagnosis not present

## 2019-01-25 DIAGNOSIS — Z03818 Encounter for observation for suspected exposure to other biological agents ruled out: Secondary | ICD-10-CM | POA: Diagnosis not present

## 2019-02-22 DIAGNOSIS — Z20828 Contact with and (suspected) exposure to other viral communicable diseases: Secondary | ICD-10-CM | POA: Diagnosis not present

## 2019-03-01 DIAGNOSIS — Z20828 Contact with and (suspected) exposure to other viral communicable diseases: Secondary | ICD-10-CM | POA: Diagnosis not present

## 2019-03-16 ENCOUNTER — Encounter: Payer: Self-pay | Admitting: Nurse Practitioner

## 2019-03-16 ENCOUNTER — Non-Acute Institutional Stay: Payer: Medicare Other | Admitting: Nurse Practitioner

## 2019-03-16 DIAGNOSIS — G609 Hereditary and idiopathic neuropathy, unspecified: Secondary | ICD-10-CM

## 2019-03-16 DIAGNOSIS — E039 Hypothyroidism, unspecified: Secondary | ICD-10-CM | POA: Diagnosis not present

## 2019-03-16 DIAGNOSIS — M109 Gout, unspecified: Secondary | ICD-10-CM

## 2019-03-16 DIAGNOSIS — I10 Essential (primary) hypertension: Secondary | ICD-10-CM

## 2019-03-16 DIAGNOSIS — R635 Abnormal weight gain: Secondary | ICD-10-CM

## 2019-03-16 NOTE — Progress Notes (Signed)
Location:   [facility] Room Number: 032 ZYYQM of Service:  ALF [phone]) Provider:  ,  NP  Crist Infante, MD  Patient Care Team: Crist Infante, MD as PCP - General (Internal Medicine) ,  X, NP as Nurse Practitioner (Internal Medicine) Virgie Dad, MD as Consulting Physician (Internal Medicine)  Extended Emergency Contact Information Primary Emergency Contact: P[place],[name]  [place] of [place] Phone: [phone] Relation: Other  Code Status:  DNR Goals of care: Advanced Directive information Advanced Directives 10/12/2018  Does Patient Have a Medical Advance Directive? Yes  Type of Advance Directive Out of facility DNR (pink MOST or yellow form)  Does patient want to make changes to medical advance directive? No - Patient declined  Pre-existing out of facility DNR order (yellow form or pink MOST form) Yellow form placed in chart (order not valid for inpatient use)     Chief Complaint  Patient presents with  . Medical agement of Chronic Issues  . Health Maintenance    PCV13, TDAP, Dexa    HPI:  Pt is a 86 y.o. female seen today for medical management of chronic diseases.    The patient resides in [place] [facility] for safety, care assistance, ambulates with walker. She was noted to have about #10Ibs weight gain in the past 3 months, no apparent fluid retention. Hx of Hypothyroidism, on synthroid 69mg qd, 1016m wkly. Peripheral neuropathy, stable, takes Lyric 5030mid prn. Gout, stable, takes Colchicine 0.6 mg bid. HTN, blood pressure is controlled on Benazepril 98m62m.    Past Medical History:  Diagnosis Date  . Cognitive changes   . Depression   . Gout   . Hypertension   . Hypothyroidism   . Peripheral neuropathy    History reviewed. No pertinent surgical history.  Allergies  Allergen Reactions  . Actonel [Risedronate Sodium]   . Hct [Hydrochlorothiazide]   . Lipitor [Atorvastatin Calcium]   . Neomycin Other (See Comments)      Unknown "been so long ago, a Dermatologist told me"  . Polysporin [Bacitracin-Polymyxin B]   . Prolia [Denosumab]   . Sulfa Antibiotics   . Zocor [Simvastatin]     Allergies as of 03/16/2019      Reactions   Actonel [risedronate Sodium]    Hct [hydrochlorothiazide]    Lipitor [atorvastatin Calcium]    Neomycin Other (See Comments)   Unknown "been so long ago, a Dermatologist told me"   Polysporin [bacitracin-polymyxin B]    Prolia [denosumab]    Sulfa Antibiotics    Zocor [simvastatin]       Medication List       Accurate as of March 16, 2019  2:44 PM. If you have any questions, ask your nurse or doctor.        STOP taking these medications   Alfalfa 500 MG Tabs Stopped by:  X , NP   vitamin C 1000 MG tablet Stopped by:  X , NP     TAKE these medications   benazepril 10 MG tablet Commonly known as: LOTENSIN Take 10 mg by mouth daily.   Calcium Carb-Cholecalciferol 1000-800 MG-UNIT Tabs Take 1 tablet by mouth daily.   colchicine 0.6 MG tablet Take 0.6 mg by mouth 2 (two) times daily.   CoQ10 100 MG Caps Take 2 capsules by mouth daily.   levothyroxine 100 MCG tablet Commonly known as: SYNTHROID Take 100 mcg by mouth once a week. On Wednesday   levothyroxine 50 MCG tablet Commonly known as: SYNTHROID  Take 50 mcg by mouth daily before breakfast. Sun,Mon,Tue, Thur,Fri,Sat   Omega-3 1000 MG Caps Take 4 capsules by mouth daily.   pregabalin 50 MG capsule Commonly known as: LYRICA Take 50 mg by mouth 2 (two) times daily as needed.   Vitamin D3 25 MCG (1000 UT) Caps Take 1 capsule by mouth 2 (two) times daily.      ROS was provided with assistance of staff.  Review of Systems  Constitutional: Positive for unexpected weight change. Negative for activity change, appetite change, chills, diaphoresis, fatigue and fever.  HENT: Positive for hearing loss. Negative for congestion and voice change.   Eyes: Negative for visual disturbance.   Respiratory: Negative for cough, shortness of breath and wheezing.   Cardiovascular: Negative for chest pain, palpitations and leg swelling.  Gastrointestinal: Negative for abdominal distention, abdominal pain, constipation, diarrhea, nausea and vomiting.  Genitourinary: Negative for difficulty urinating, dysuria and urgency.  Musculoskeletal: Positive for gait problem.  Skin: Negative for color change and pallor.  Neurological: Negative for dizziness, speech difficulty, weakness and headaches.       Memory lapses.   Psychiatric/Behavioral: Negative for agitation, behavioral problems, hallucinations and sleep disturbance. The patient is not nervous/anxious.     Immunization History  Administered Date(s) Administered  . Influenza, High Dose Seasonal PF 12/11/2018  . Influenza-Unspecified 03/24/2018   Pertinent  Health Maintenance Due  Topic Date Due  . DEXA SCAN  01/08/1992  . PNA vac Low Risk Adult (1 of 2 - PCV13) 01/08/1992  . INFLUENZA VACCINE  Completed   No flowsheet data found. Functional Status Survey:    Vitals:   03/16/19 0820  BP: 140/60  Pulse: 65  Resp: 17  Temp: (!) 97.5 F (36.4 C)  SpO2: 96%  Weight: 138 lb 12.8 oz (63 kg)  Height: 5' (1.524 m)   Body mass index is 27.11 kg/m. Physical Exam Vitals and nursing note reviewed.  Constitutional:      General: She is not in acute distress.    Appearance: Normal appearance. She is not ill-appearing, toxic-appearing or diaphoretic.     Comments: Over weight.   HENT:     Head: Normocephalic and atraumatic.     Nose: Nose normal.     Mouth/Throat:     Mouth: Mucous membranes are moist.  Eyes:     Extraocular Movements: Extraocular movements intact.     Conjunctiva/sclera: Conjunctivae normal.     Pupils: Pupils are equal, round, and reactive to light.  Cardiovascular:     Rate and Rhythm: Normal rate and regular rhythm.     Heart sounds: No murmur.  Pulmonary:     Breath sounds: No wheezing, rhonchi or  rales.  Abdominal:     General: Bowel sounds are normal. There is no distension.     Palpations: Abdomen is soft.     Tenderness: There is no abdominal tenderness. There is no right CVA tenderness, left CVA tenderness, guarding or rebound.  Musculoskeletal:     Cervical back: Normal range of motion and neck supple.     Right lower leg: No edema.     Left lower leg: No edema.  Skin:    General: Skin is warm and dry.  Neurological:     General: No focal deficit present.     Mental Status: She is alert and oriented to person, place, and time. Mental status is at baseline.     Cranial Nerves: No cranial nerve deficit.     Motor: No weakness.  Coordination: Coordination normal.     Gait: Gait abnormal.  Psychiatric:        Mood and Affect: Mood normal.        Behavior: Behavior normal.        Thought Content: Thought content normal.        Judgment: Judgment normal.     Labs reviewed: No results for input(s): NA, K, CL, CO2, GLUCOSE, BUN, CREATININE, CALCIUM, MG, PHOS in the last 8760 hours. No results for input(s): AST, ALT, ALKPHOS, BILITOT, PROT, ALBUMIN in the last 8760 hours. No results for input(s): WBC, NEUTROABS, HGB, HCT, MCV, PLT in the last 8760 hours.  No results found for: HGBA1C No results found for: CHOL, HDL, LDLCALC, LDLDIRECT, TRIG, CHOLHDL  Significant Diagnostic Results in last 30 days:  No results found.  Assessment/Plan HTN (hypertension) Blood pressure is controlled, continue Benazepril, update CMP/eGFR.   Hypothyroidism TSH 2.69 10/12/18, continue Synthroid, update TSH in setting of weight gain.  MMSE 29/30 passed clock drawing 01/02/19  Peripheral neuropathy Stable, continue Lyrica.   Gout Stable. Continue Colchicine, update uric acid level, CBC/diff.   Weight gain About #10 Ibs in the past 3 months, will observe, update labs.      Family/ staff Communication: plan of care reviewed with the patient and charge nurse.   Labs/tests ordered:   CBC/diff, CMP/eGFR, TSH, uric acid level.   Time spend 40 minutes.

## 2019-03-16 NOTE — Assessment & Plan Note (Signed)
About #10 Ibs in the past 3 months, will observe, update labs.

## 2019-03-16 NOTE — Assessment & Plan Note (Addendum)
TSH 2.69 10/12/18, continue Synthroid, update TSH in setting of weight gain.  MMSE 29/30 passed clock drawing 01/02/19

## 2019-03-16 NOTE — Assessment & Plan Note (Signed)
Stable, continue Lyrica

## 2019-03-16 NOTE — Assessment & Plan Note (Signed)
Blood pressure is controlled, continue Benazepril, update CMP/eGFR.

## 2019-03-16 NOTE — Assessment & Plan Note (Addendum)
Stable. Continue Colchicine, update uric acid level, CBC/diff.

## 2019-03-17 DIAGNOSIS — I10 Essential (primary) hypertension: Secondary | ICD-10-CM | POA: Diagnosis not present

## 2019-03-18 LAB — CBC AND DIFFERENTIAL
HCT: 39 (ref 36–46)
Hemoglobin: 13.1 (ref 12.0–16.0)
Neutrophils Absolute: 5502
Platelets: 176 (ref 150–399)
WBC: 7.6

## 2019-03-18 LAB — BASIC METABOLIC PANEL
BUN: 25 — AB (ref 4–21)
CO2: 24 — AB (ref 13–22)
Chloride: 107 (ref 99–108)
Creatinine: 1.1 (ref 0.5–1.1)
Glucose: 80
Potassium: 4 (ref 3.4–5.3)
Sodium: 142 (ref 137–147)

## 2019-03-18 LAB — HEPATIC FUNCTION PANEL
ALT: 11 (ref 7–35)
AST: 17 (ref 13–35)
Alkaline Phosphatase: 73 (ref 25–125)
Bilirubin, Total: 0.4

## 2019-03-18 LAB — COMPREHENSIVE METABOLIC PANEL
Albumin: 3.7 (ref 3.5–5.0)
Calcium: 9.1 (ref 8.7–10.7)
Globulin: 2.7

## 2019-03-18 LAB — TSH: TSH: 2.41 (ref 0.41–5.90)

## 2019-03-18 LAB — CBC: RBC: 4.1 (ref 3.87–5.11)

## 2019-03-23 DIAGNOSIS — Z20828 Contact with and (suspected) exposure to other viral communicable diseases: Secondary | ICD-10-CM | POA: Diagnosis not present

## 2019-03-30 DIAGNOSIS — Z20828 Contact with and (suspected) exposure to other viral communicable diseases: Secondary | ICD-10-CM | POA: Diagnosis not present

## 2019-04-06 DIAGNOSIS — Z20828 Contact with and (suspected) exposure to other viral communicable diseases: Secondary | ICD-10-CM | POA: Diagnosis not present

## 2019-04-13 DIAGNOSIS — Z20828 Contact with and (suspected) exposure to other viral communicable diseases: Secondary | ICD-10-CM | POA: Diagnosis not present

## 2019-04-20 DIAGNOSIS — Z20828 Contact with and (suspected) exposure to other viral communicable diseases: Secondary | ICD-10-CM | POA: Diagnosis not present

## 2019-04-27 DIAGNOSIS — Z20828 Contact with and (suspected) exposure to other viral communicable diseases: Secondary | ICD-10-CM | POA: Diagnosis not present

## 2019-05-06 ENCOUNTER — Telehealth: Payer: Self-pay | Admitting: Internal Medicine

## 2019-05-06 NOTE — Telephone Encounter (Signed)
°  Dr. Alto Denver with [facility] called.  She stated that patient needs you to write a letter to her long term care insurance stating the she has a POA.  Also the letter needs to state that patient is unable to handle her affairs.  Baker Janus states that this letter can be on [facility] letterhead.  Per Baker Janus she needs this letter asap.  You can reach Douglas at [phone] ext. [phone] if you have any questions.  Please give the letter to [facility] when letter is finished.   Thank you   Adan Sis

## 2019-05-14 NOTE — Telephone Encounter (Signed)
Talked to [facility]. I have signed the form

## 2019-07-05 ENCOUNTER — Encounter: Payer: Self-pay | Admitting: Internal Medicine

## 2019-07-05 ENCOUNTER — Non-Acute Institutional Stay: Payer: Medicare Other | Admitting: Internal Medicine

## 2019-07-05 DIAGNOSIS — M109 Gout, unspecified: Secondary | ICD-10-CM | POA: Diagnosis not present

## 2019-07-05 DIAGNOSIS — E039 Hypothyroidism, unspecified: Secondary | ICD-10-CM

## 2019-07-05 DIAGNOSIS — I10 Essential (primary) hypertension: Secondary | ICD-10-CM

## 2019-07-05 DIAGNOSIS — G609 Hereditary and idiopathic neuropathy, unspecified: Secondary | ICD-10-CM

## 2019-07-05 NOTE — Progress Notes (Signed)
Location:  [facility] Room Number: [id number] Place of Service:  ALF (13)  Provider:   Code Status:  Goals of Care:  Advanced Directives 10/12/2018  Does Patient Have a Medical Advance Directive? Yes  Type of Advance Directive Out of facility DNR (pink MOST or yellow form)  Does patient want to make changes to medical advance directive? No - Patient declined  Pre-existing out of facility DNR order (yellow form or pink MOST form) Yellow form placed in chart (order not valid for inpatient use)     Chief Complaint  Patient presents with  . Medical Management of Chronic Issues  . Health Maintenance    TDAP, Dexa scan, PNA    HPI: Patient is a 86 y.o. female seen today for medical management of chronic diseases.  Patient has a history of hypertension,gout, hypothyroidism and peripheral neuropathy ? Vit D Def and Osteoporosis  Patient  has been stable in AL Weight is  Stable now No Falls. Walk with walker. Had no Complains today. No New Nursing issues   Past Medical History:  Diagnosis Date  . Cognitive changes   . Depression   . Gout   . Hypertension   . Hypothyroidism   . Peripheral neuropathy     History reviewed. No pertinent surgical history.  Allergies  Allergen Reactions  . Actonel [Risedronate Sodium]   . Hct [Hydrochlorothiazide]   . Lipitor [Atorvastatin Calcium]   . Neomycin Other (See Comments)    Unknown "been so long ago, a Dermatologist told me"  . Polysporin [Bacitracin-Polymyxin B]   . Prolia [Denosumab]   . Sulfa Antibiotics   . Zocor [Simvastatin]     Outpatient Encounter Medications as of 07/05/2019  Medication Sig  . allopurinol (ZYLOPRIM) 100 MG tablet Take 100 mg by mouth daily.  . benazepril (LOTENSIN) 10 MG tablet Take 10 mg by mouth daily.  . Calcium Carb-Cholecalciferol 1000-800 MG-UNIT TABS Take 1 tablet by mouth daily.  . Cholecalciferol (VITAMIN D3) 25 MCG (1000 UT) CAPS Take 1 capsule by mouth 2 (two) times daily.    . Coenzyme Q10 (COQ10) 100 MG CAPS Take 2 capsules by mouth daily.  . colchicine 0.6 MG tablet Take 0.6 mg by mouth 2 (two) times daily.  Marland Kitchen levothyroxine (SYNTHROID) 100 MCG tablet Take 100 mcg by mouth once a week. On Wednesday  . levothyroxine (SYNTHROID) 50 MCG tablet Take 50 mcg by mouth daily before breakfast. Sun,Mon,Tue, Thur,Fri,Sat  . Omega-3 1000 MG CAPS Take 4 capsules by mouth daily.  . pregabalin (LYRICA) 50 MG capsule Take 50 mg by mouth 2 (two) times daily as needed.   No facility-administered encounter medications on file as of 07/05/2019.    Review of Systems:  Review of Systems  Review of Systems  Constitutional: Negative for activity change, appetite change, chills, diaphoresis, fatigue and fever.  HENT: Negative for mouth sores, postnasal drip, rhinorrhea, sinus pain and sore throat.   Respiratory: Negative for apnea, cough, chest tightness, shortness of breath and wheezing.   Cardiovascular: Negative for chest pain, palpitations and leg swelling.  Gastrointestinal: Negative for abdominal distention, abdominal pain, constipation, diarrhea, nausea and vomiting.  Genitourinary: Negative for dysuria and frequency.  Musculoskeletal: Negative for arthralgias, joint swelling and myalgias.  Skin: Negative for rash.  Neurological: Negative for dizziness, syncope, weakness, light-headedness and numbness.  Psychiatric/Behavioral: Negative for behavioral problems, confusion and sleep disturbance.      Health Maintenance  Topic Date Due  . TETANUS/TDAP  Never done  .  DEXA SCAN  Never done  . PNA vac Low Risk Adult (1 of 2 - PCV13) Never done  . INFLUENZA VACCINE  10/09/2019  . COVID-19 Vaccine  Completed    Physical Exam: Vitals:   07/05/19 1525  BP: 126/62  Pulse: 76  Resp: 20  Temp: (!) 97.2 F (36.2 C)  SpO2: 96%  Weight: 144 lb (65.3 kg)  Height: 5\' 1"  (1.549 m)   Body mass index is 27.21 kg/m. Physical Exam  Constitutional: Oriented to person, place,  and time. Well-developed and well-nourished.  HENT:  Head: Normocephalic.  Mouth/Throat: Oropharynx is clear and moist.  Eyes: Pupils are equal, round, and reactive to light.  Neck: Neck supple.  Cardiovascular: Normal rate and normal heart sounds.  No murmur heard. Pulmonary/Chest: Effort normal and breath sounds normal. No respiratory distress. No wheezes. She has no rales.  Abdominal: Soft. Bowel sounds are normal. No distension. There is no tenderness. There is no rebound.  Musculoskeletal: No edema.  Lymphadenopathy: none Neurological: Alert and oriented to person, place, and time.  Walks with the walker Skin: Skin is warm and dry.  Psychiatric: Normal mood and affect. Behavior is normal. Thought content normal.    Labs reviewed: Basic Metabolic Panel: Recent Labs    03/18/19 0000  NA 142  K 4.0  CL 107  CO2 24*  BUN 25*  CREATININE 1.1  CALCIUM 9.1  TSH 2.41   Liver Function Tests: Recent Labs    03/18/19 0000  AST 17  ALT 11  ALKPHOS 73  ALBUMIN 3.7   No results for input(s): LIPASE, AMYLASE in the last 8760 hours. No results for input(s): AMMONIA in the last 8760 hours. CBC: Recent Labs    03/18/19 0000  WBC 7.6  NEUTROABS 5,502  HGB 13.1  HCT 39  PLT 176   Lipid Panel: No results for input(s): CHOL, HDL, LDLCALC, TRIG, CHOLHDL, LDLDIRECT in the last 8760 hours. No results found for: HGBA1C  Procedures since last visit: No results found.  Assessment/Plan  Essential hypertension- Plan:  BP stable BUN and Creat are stable  Hypothyroidism,- Plan: TSH normal in 1/21  Idiopathic peripheral neuropathy - Plan: On Lyrica Will discontinue as has not used it  Gout, - Plan Uric Acid was 7.7 Was Started on Allopurinal  Cognitive impairment 29/30 Passed Clock Drawing Unsteady gait Walks with the walker with no recent falls  Labs/tests ordered:  * No order type specified * Next appt:  Visit date not found

## 2019-07-15 ENCOUNTER — Emergency Department (HOSPITAL_COMMUNITY): Payer: Medicare Other

## 2019-07-15 ENCOUNTER — Emergency Department (HOSPITAL_COMMUNITY)
Admission: EM | Admit: 2019-07-15 | Discharge: 2019-07-15 | Disposition: A | Discharge status: Home / Self Care | Payer: Medicare Other | Attending: Emergency Medicine | Admitting: Emergency Medicine

## 2019-07-15 DIAGNOSIS — Z79899 Other long term (current) drug therapy: Secondary | ICD-10-CM | POA: Insufficient documentation

## 2019-07-15 DIAGNOSIS — M545 Low back pain: Secondary | ICD-10-CM | POA: Insufficient documentation

## 2019-07-15 DIAGNOSIS — R52 Pain, unspecified: Secondary | ICD-10-CM | POA: Diagnosis not present

## 2019-07-15 DIAGNOSIS — Z23 Encounter for immunization: Secondary | ICD-10-CM | POA: Diagnosis not present

## 2019-07-15 DIAGNOSIS — R0902 Hypoxemia: Secondary | ICD-10-CM | POA: Diagnosis not present

## 2019-07-15 DIAGNOSIS — S199XXA Unspecified injury of neck, initial encounter: Secondary | ICD-10-CM | POA: Insufficient documentation

## 2019-07-15 DIAGNOSIS — I10 Essential (primary) hypertension: Secondary | ICD-10-CM | POA: Insufficient documentation

## 2019-07-15 DIAGNOSIS — S79922A Unspecified injury of left thigh, initial encounter: Secondary | ICD-10-CM | POA: Diagnosis not present

## 2019-07-15 DIAGNOSIS — R410 Disorientation, unspecified: Secondary | ICD-10-CM | POA: Diagnosis not present

## 2019-07-15 DIAGNOSIS — S0990XA Unspecified injury of head, initial encounter: Secondary | ICD-10-CM | POA: Diagnosis not present

## 2019-07-15 DIAGNOSIS — W0110XA Fall on same level from slipping, tripping and stumbling with subsequent striking against unspecified object, initial encounter: Secondary | ICD-10-CM | POA: Diagnosis not present

## 2019-07-15 DIAGNOSIS — S0101XA Laceration without foreign body of scalp, initial encounter: Secondary | ICD-10-CM | POA: Diagnosis not present

## 2019-07-15 DIAGNOSIS — R4182 Altered mental status, unspecified: Secondary | ICD-10-CM | POA: Diagnosis not present

## 2019-07-15 DIAGNOSIS — Y999 Unspecified external cause status: Secondary | ICD-10-CM | POA: Diagnosis not present

## 2019-07-15 DIAGNOSIS — W19XXXA Unspecified fall, initial encounter: Secondary | ICD-10-CM

## 2019-07-15 DIAGNOSIS — E039 Hypothyroidism, unspecified: Secondary | ICD-10-CM | POA: Insufficient documentation

## 2019-07-15 DIAGNOSIS — S299XXA Unspecified injury of thorax, initial encounter: Secondary | ICD-10-CM | POA: Diagnosis not present

## 2019-07-15 DIAGNOSIS — Y9301 Activity, walking, marching and hiking: Secondary | ICD-10-CM | POA: Insufficient documentation

## 2019-07-15 DIAGNOSIS — Y92129 Unspecified place in nursing home as the place of occurrence of the external cause: Secondary | ICD-10-CM | POA: Insufficient documentation

## 2019-07-15 DIAGNOSIS — S79912A Unspecified injury of left hip, initial encounter: Secondary | ICD-10-CM | POA: Diagnosis not present

## 2019-07-15 DIAGNOSIS — S3992XA Unspecified injury of lower back, initial encounter: Secondary | ICD-10-CM | POA: Diagnosis not present

## 2019-07-15 DIAGNOSIS — R569 Unspecified convulsions: Secondary | ICD-10-CM | POA: Diagnosis not present

## 2019-07-15 DIAGNOSIS — R404 Transient alteration of awareness: Secondary | ICD-10-CM | POA: Diagnosis not present

## 2019-07-15 DIAGNOSIS — S0181XA Laceration without foreign body of other part of head, initial encounter: Secondary | ICD-10-CM | POA: Diagnosis not present

## 2019-07-15 LAB — URINALYSIS, ROUTINE W REFLEX MICROSCOPIC
Bilirubin Urine: NEGATIVE
Glucose, UA: NEGATIVE mg/dL
Hgb urine dipstick: NEGATIVE
Ketones, ur: NEGATIVE mg/dL
Leukocytes,Ua: NEGATIVE
Nitrite: NEGATIVE
Protein, ur: NEGATIVE mg/dL
Specific Gravity, Urine: 1.01 (ref 1.005–1.030)
pH: 5 (ref 5.0–8.0)

## 2019-07-15 LAB — CBC WITH DIFFERENTIAL/PLATELET
Abs Immature Granulocytes: 0.03 10*3/uL (ref 0.00–0.07)
Basophils Absolute: 0.1 10*3/uL (ref 0.0–0.1)
Basophils Relative: 1 %
Eosinophils Absolute: 0.2 10*3/uL (ref 0.0–0.5)
Eosinophils Relative: 2 %
HCT: 39.3 % (ref 36.0–46.0)
Hemoglobin: 12.6 g/dL (ref 12.0–15.0)
Immature Granulocytes: 0 %
Lymphocytes Relative: 6 %
Lymphs Abs: 0.5 10*3/uL — ABNORMAL LOW (ref 0.7–4.0)
MCH: 32.1 pg (ref 26.0–34.0)
MCHC: 32.1 g/dL (ref 30.0–36.0)
MCV: 100 fL (ref 80.0–100.0)
Monocytes Absolute: 0.5 10*3/uL (ref 0.1–1.0)
Monocytes Relative: 5 %
Neutro Abs: 7.4 10*3/uL (ref 1.7–7.7)
Neutrophils Relative %: 86 %
Platelets: 170 10*3/uL (ref 150–400)
RBC: 3.93 MIL/uL (ref 3.87–5.11)
RDW: 14.1 % (ref 11.5–15.5)
WBC: 8.7 10*3/uL (ref 4.0–10.5)
nRBC: 0 % (ref 0.0–0.2)

## 2019-07-15 LAB — TROPONIN I (HIGH SENSITIVITY)
Troponin I (High Sensitivity): 8 ng/L (ref <18)
Troponin I (High Sensitivity): 9 ng/L (ref <18)

## 2019-07-15 LAB — COMPREHENSIVE METABOLIC PANEL
ALT: 15 U/L (ref 0–44)
AST: 19 U/L (ref 15–41)
Albumin: 3.1 g/dL — ABNORMAL LOW (ref 3.5–5.0)
Alkaline Phosphatase: 61 U/L (ref 38–126)
Anion gap: 8 (ref 5–15)
BUN: 18 mg/dL (ref 8–23)
CO2: 24 mmol/L (ref 22–32)
Calcium: 8.7 mg/dL — ABNORMAL LOW (ref 8.9–10.3)
Chloride: 109 mmol/L (ref 98–111)
Creatinine, Ser: 1.03 mg/dL — ABNORMAL HIGH (ref 0.44–1.00)
GFR calc Af Amer: 55 mL/min — ABNORMAL LOW (ref >60)
GFR calc non Af Amer: 47 mL/min — ABNORMAL LOW (ref >60)
Glucose, Bld: 98 mg/dL (ref 70–99)
Potassium: 3.3 mmol/L — ABNORMAL LOW (ref 3.5–5.1)
Sodium: 141 mmol/L (ref 135–145)
Total Bilirubin: 0.5 mg/dL (ref 0.3–1.2)
Total Protein: 6 g/dL — ABNORMAL LOW (ref 6.5–8.1)

## 2019-07-15 LAB — MAGNESIUM: Magnesium: 1.9 mg/dL (ref 1.7–2.4)

## 2019-07-15 IMAGING — CT CT L SPINE W/O CM
4 of 6 series · 13 of 33 positions shown, 15 images · non-contrast
Comparison: CT abdomen/pelvis [DATE]

CLINICAL DATA: Low back pain, trauma.  Additional provided: Fall.

EXAM:
CT LUMBAR SPINE WITHOUT CONTRAST
TECHNIQUE: Multidetector CT imaging of the lumbar spine was performed without
intravenous contrast administration. Multiplanar CT image
reconstructions were also generated.

[Series 5: l spine soft · axial · 0.33mm/px · z∈[+668,+704]mm · 2 of 109 slices shown]
[im 19/109  soft-tissue]
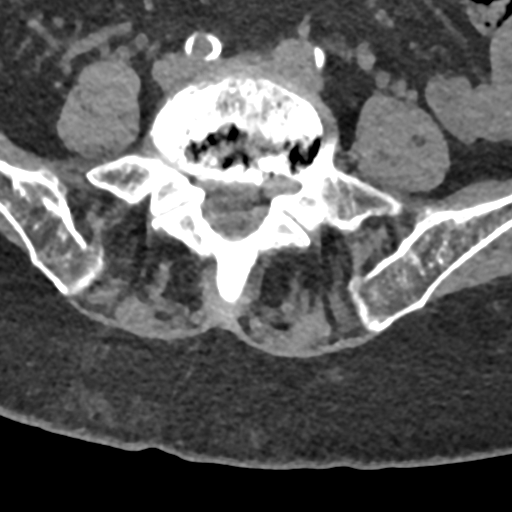
[im 37/109  soft-tissue]
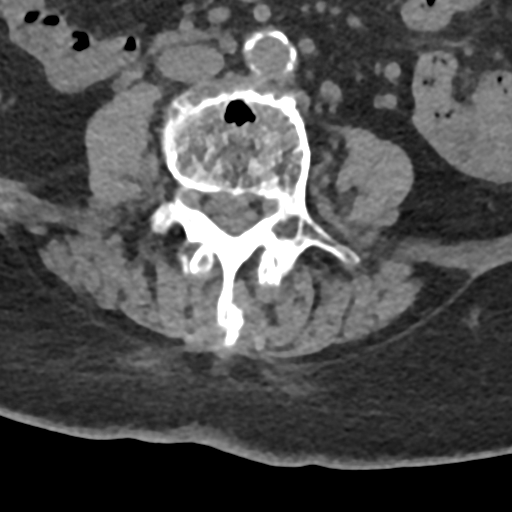

[Series 6: sag bone · sagittal · 0.27mm/px · 5 of 78 slices shown]
[im 13/78  bone]
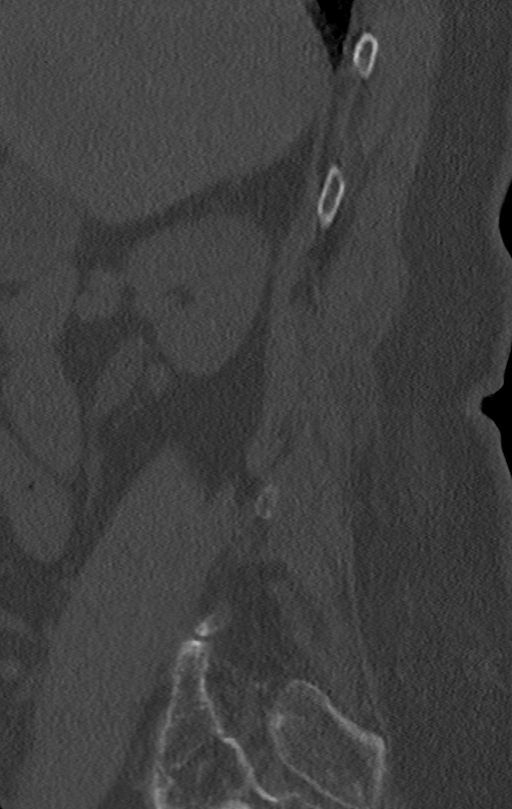
[im 26/78  bone]
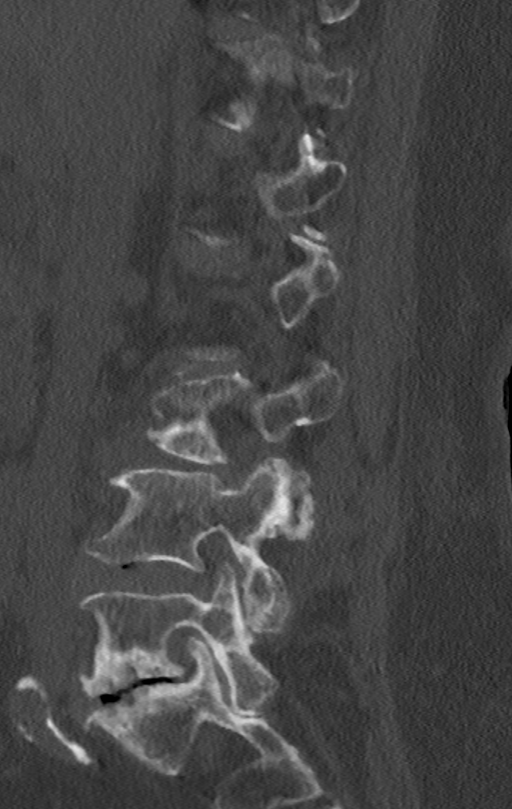
[im 39/78  bone]
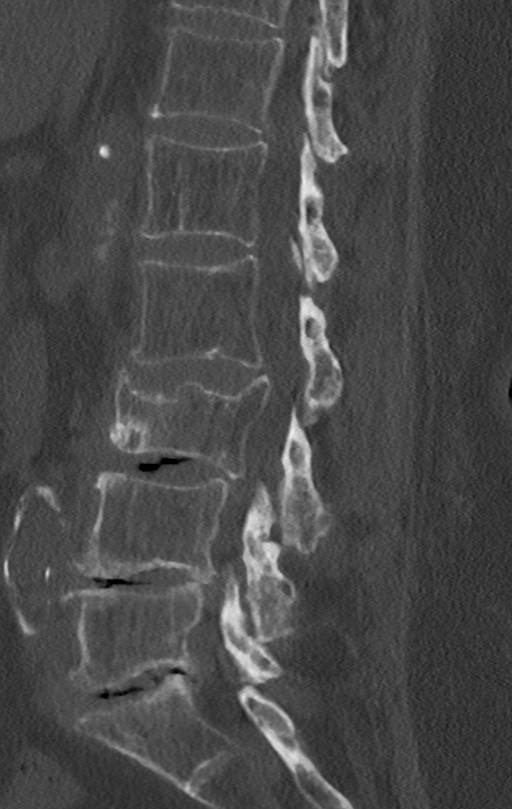
[im 52/78  bone]
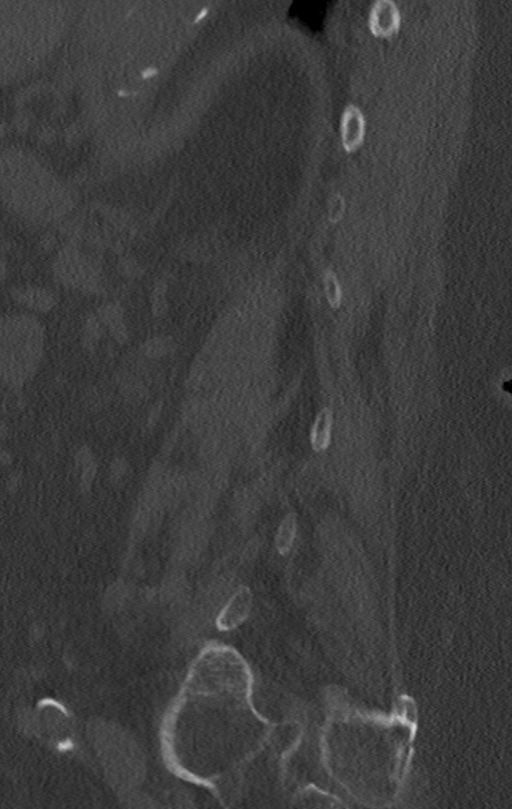
[im 65/78  bone]
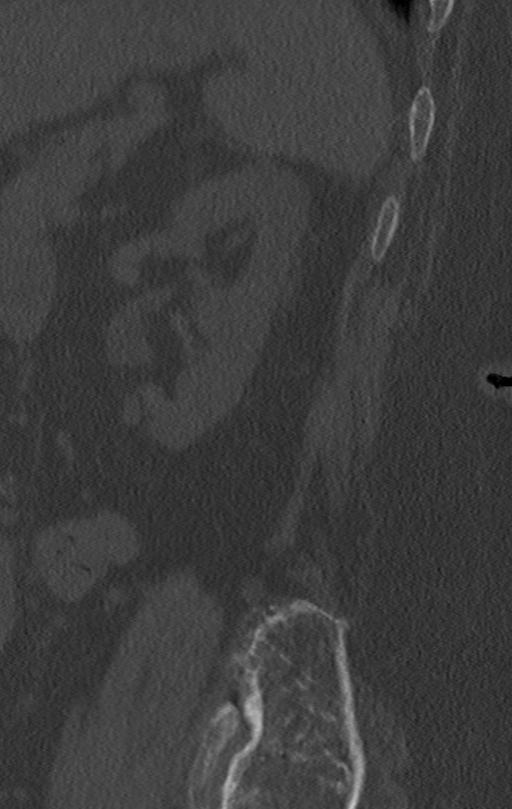

[Series 7: cor bone · coronal · 0.33mm/px · 1 of 70 slices shown]
[im 35/70  bone]
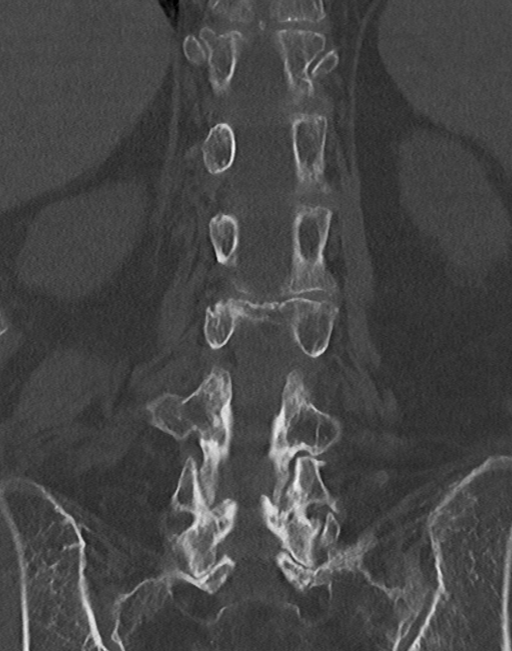

[Series 10: orthogonal · axial · 0.21mm/px · z∈[+648,+809]mm · 5 of 113 slices shown, 7 images]
[im 19/113  soft-tissue]
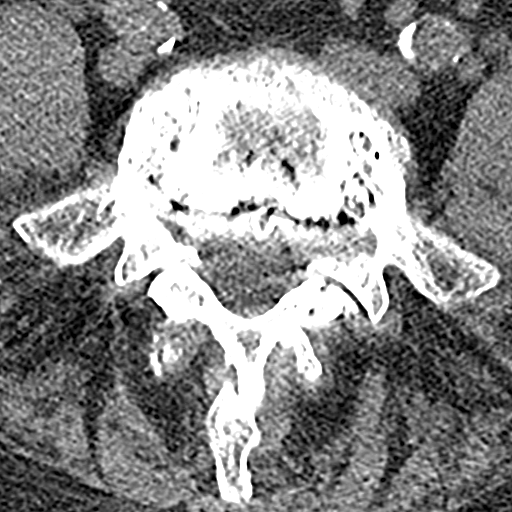
[im 19/113  bone]
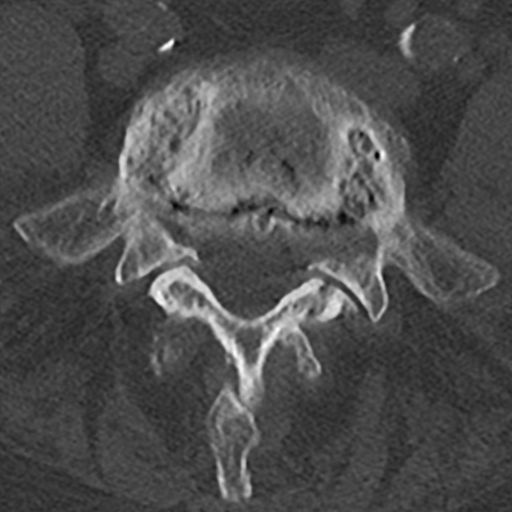
[im 38/113  bone]
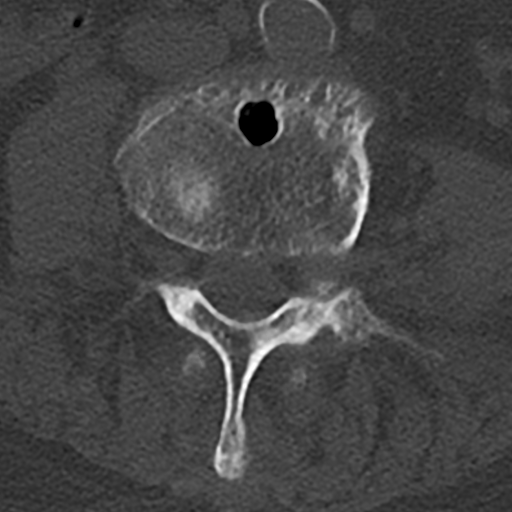
[im 57/113  bone]
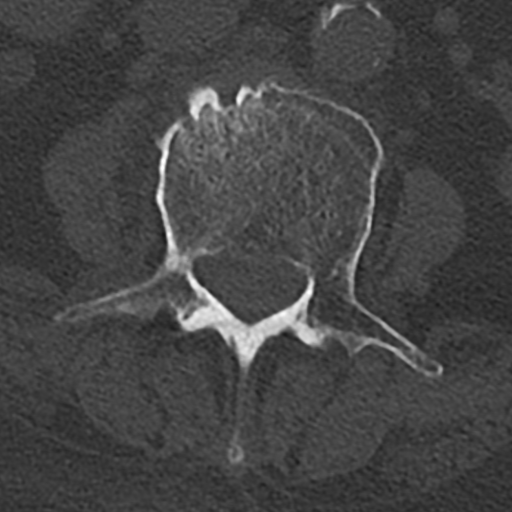
[im 75/113  bone]
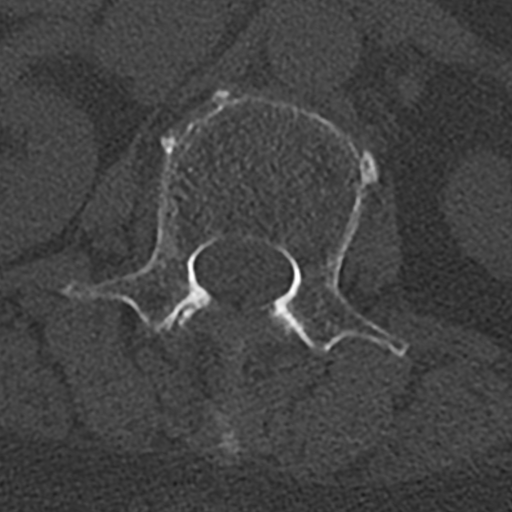
[im 94/113  soft-tissue]
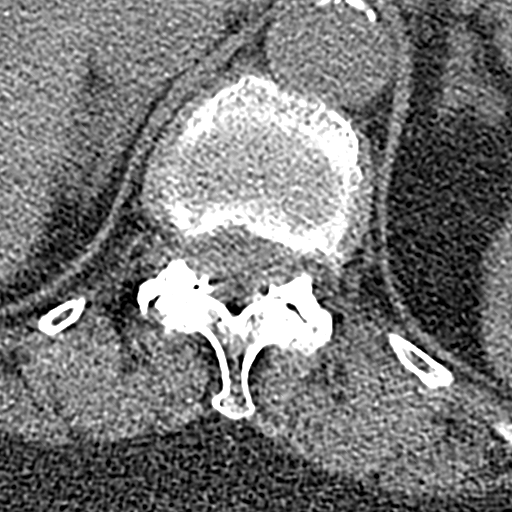
[im 94/113  bone]
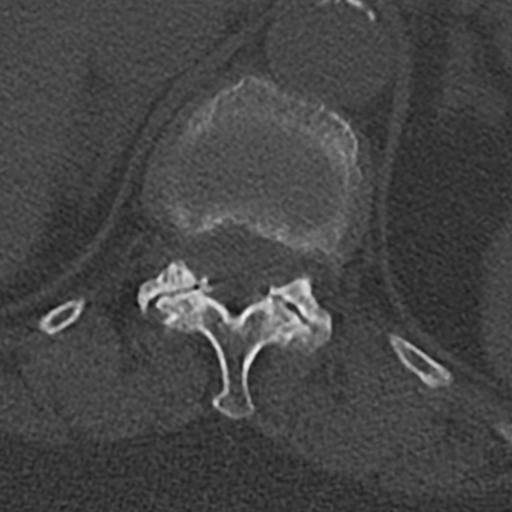

[13 of 33 positions shown; findings below may reference images not displayed]

FINDINGS: Segmentation: 5 lumbar vertebrae.

Alignment: Mild lumbar levocurvature. Reversal of the expected
lumbar lordosis centered at L3. There is 3 mm bony retropulsion at
the L3 level. Trace L1-L2 grade 1 anterolisthesis. Mild L4-L5 and
L5-S1 grade 1 retrolisthesis.

Vertebrae: Chronic L3 compression fracture with approximately 25%
height loss and mild bony retropulsion. This is unchanged as
compared to CT abdomen/pelvis [DATE]. Vertebral body height is
otherwise maintained. No evidence of acute fracture to the lumbar
spine. Diffuse osseous demineralization.

Paraspinal and other soft tissues: Aortoiliac atherosclerosis. Right
renal calculi versus renovascular calcifications paraspinal soft
tissues within normal limits.

Disc levels:

Multilevel disc space narrowing and disc vacuum phenomenon. Most
notably, there is moderate disc space narrowing at the L3-L4, L4-L5
and L5-S1 levels.

L1-L2: Disc bulge. Facet arthrosis. No appreciable significant
spinal canal stenosis. Mild bilateral neural foraminal narrowing.

L2-L3: L3 bony retropulsion with disc bulge and facet arthrosis.
Suspected moderate spinal canal stenosis. No significant foraminal
narrowing.

L3-L4: L3 bony retropulsion. Disc bulge. Facet arthrosis/ligamentum
flavum hypertrophy. Suspected at least moderate spinal canal
stenosis. Bilateral neural foraminal narrowing (moderate right,
moderate/severe left).

L4-L5: Disc bulge asymmetric to the left. Facet arthrosis/ligamentum
flavum hypertrophy. Suspected at least moderate spinal canal
stenosis. Bilateral neural foraminal narrowing (moderate right,
severe left).

L5-S1: Disc bulge with endplate spurring. Facet arthrosis/ligamentum
flavum hypertrophy. Left greater than right subarticular narrowing.
No more than mild central canal stenosis. Severe bilateral neural
foraminal narrowing.
IMPRESSION: No evidence of acute fracture to the lumbar spine.

Chronic L3 compression fracture, unchanged as compared to CT
[DATE]. As before, there is mild bony retropulsion at this
level.

Lumbar spondylosis as outlined. There is multifactorial suspected
moderate spinal canal stenosis at L2-L3, and at least moderate
spinal canal stenosis at L3-L4 and L4-L5.

Multilevel neural foraminal narrowing greatest on the left at L4-L5
and bilaterally at L5-S1 (severe at these sites).

Aortoiliac atherosclerosis.

Right renal calculi versus renovascular calcifications.

## 2019-07-15 IMAGING — CT CT CERVICAL SPINE W/O CM
3 of 4 series · 13 of 33 positions shown, 16 images · non-contrast
Comparison: No pertinent prior studies available for comparison.

CLINICAL DATA: Head trauma, minor. Neck trauma. Additional
provided: Fall, positive loss of consciousness, laceration to back
of head.

EXAM:
CT HEAD WITHOUT CONTRAST
CT CERVICAL SPINE WITHOUT CONTRAST
TECHNIQUE: Multidetector CT imaging of the head and cervical spine was
performed following the standard protocol without intravenous
contrast. Multiplanar CT image reconstructions of the cervical spine
were also generated.

[Series 8: sag bone · sagittal · 0.31mm/px · 5 of 61 slices shown, 6 images]
[im 21/61  bone]
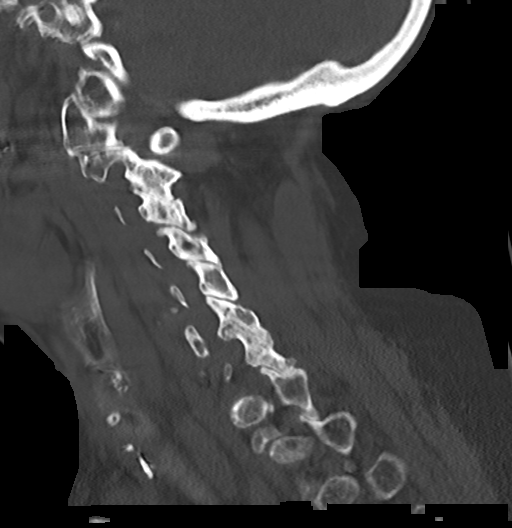
[im 26/61  bone]
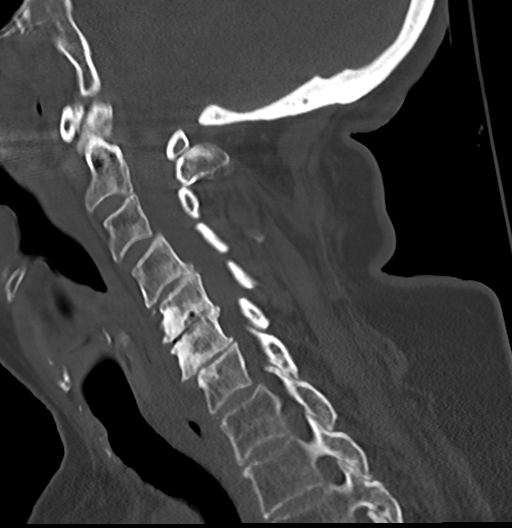
[im 31/61  soft-tissue]
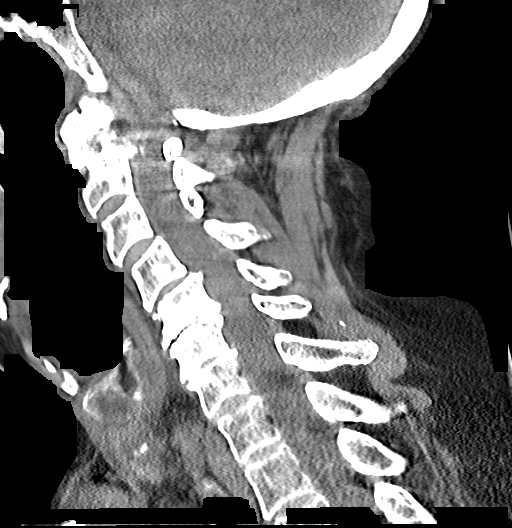
[im 31/61  bone]
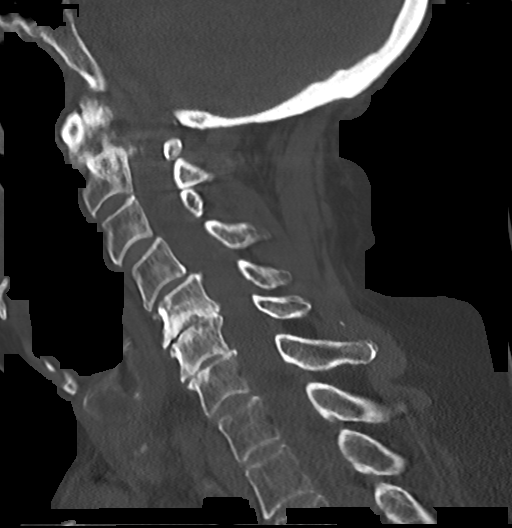
[im 36/61  bone]
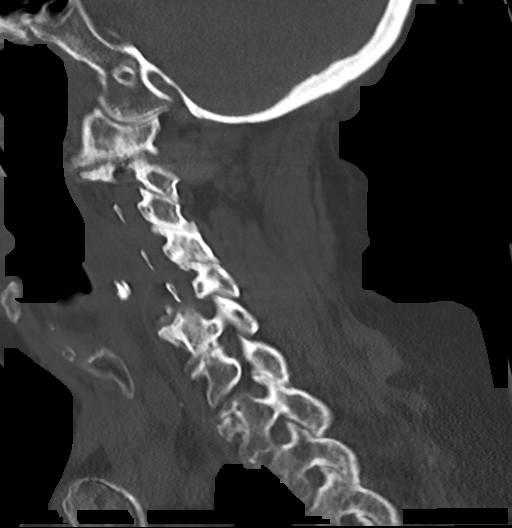
[im 41/61  bone]
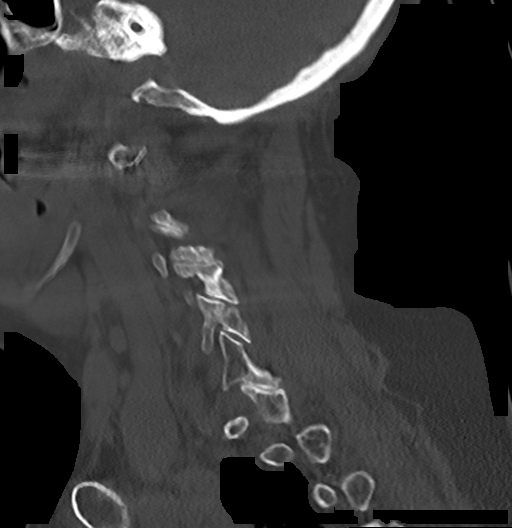

[Series 9: cor bone · coronal · 0.23mm/px · 3 of 51 slices shown]
[im 11/51  bone]
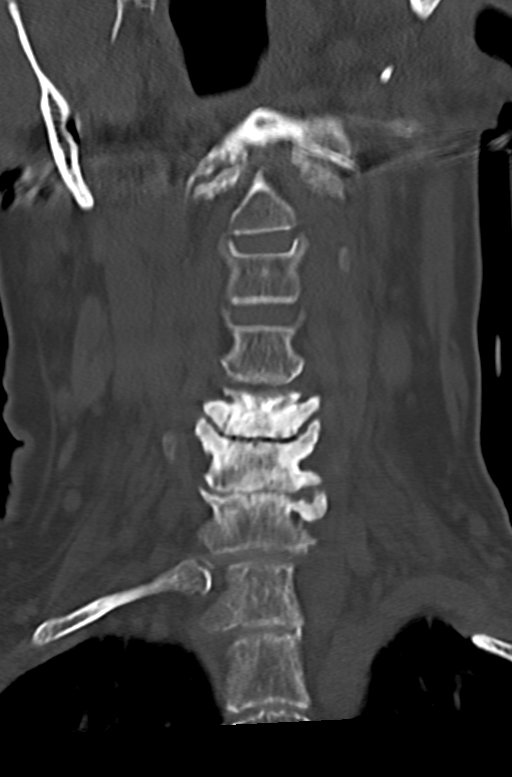
[im 21/51  bone]
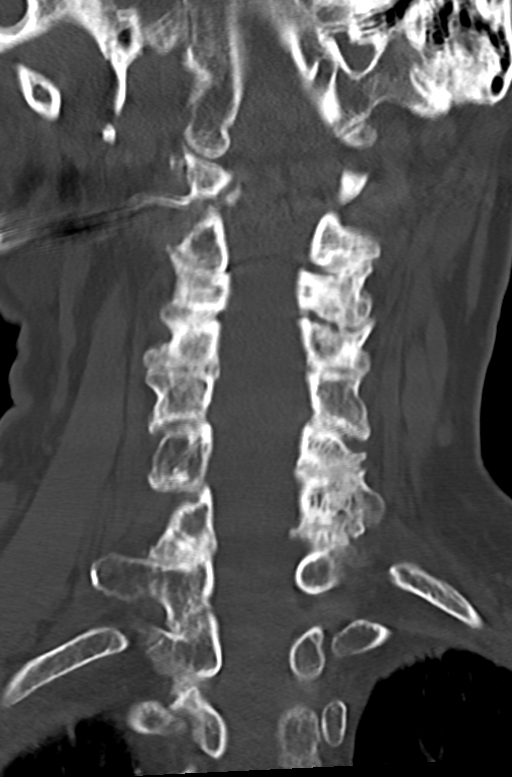
[im 31/51  bone]
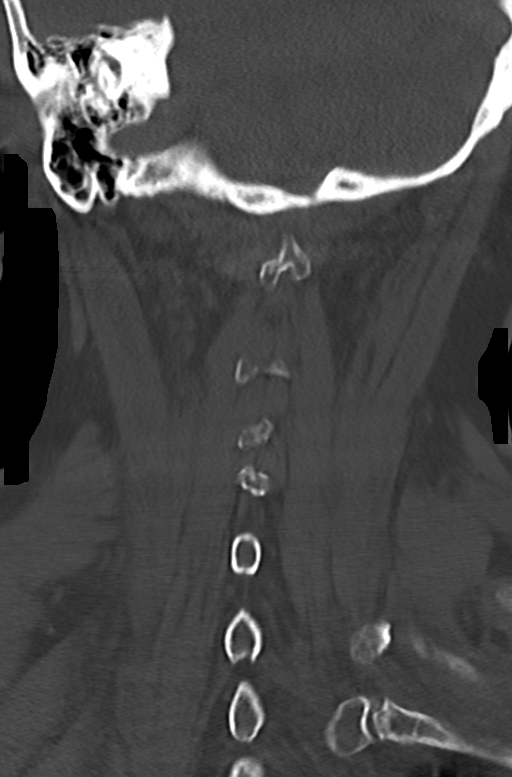

[Series 10: orthogonal axials · axial · 0.21mm/px · z∈[+1016,+1112]mm · 5 of 84 slices shown, 7 images]
[im 14/84  soft-tissue]
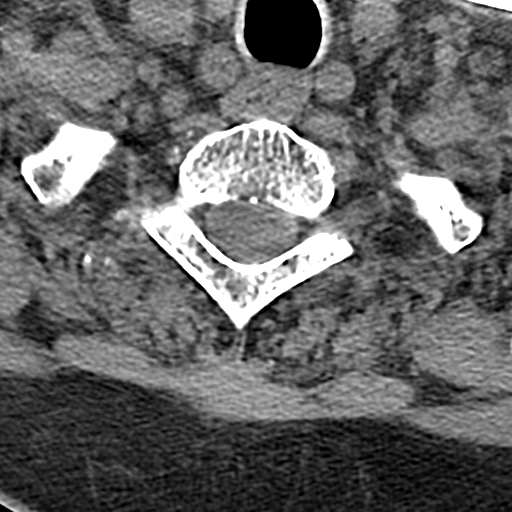
[im 14/84  bone]
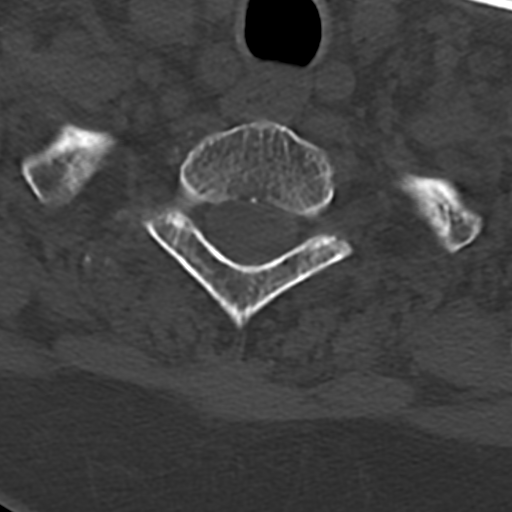
[im 28/84  bone]
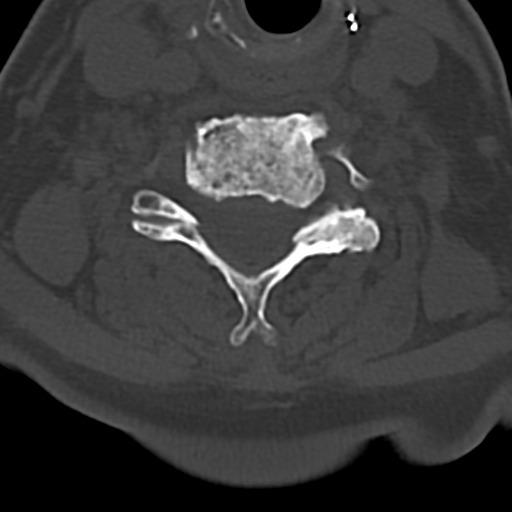
[im 42/84  bone]
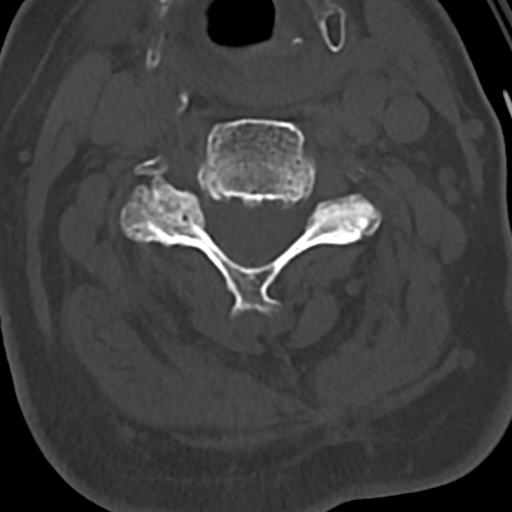
[im 56/84  bone]
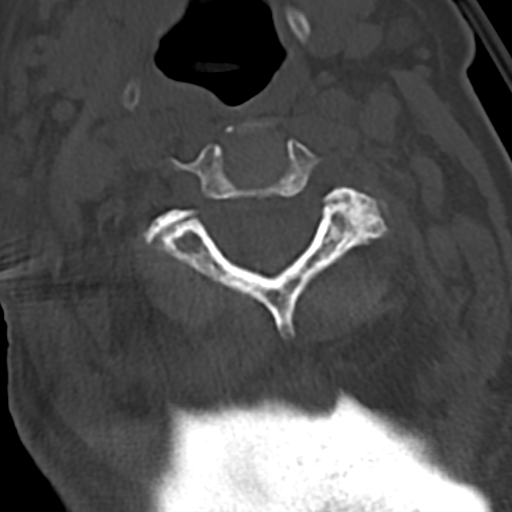
[im 70/84  soft-tissue]
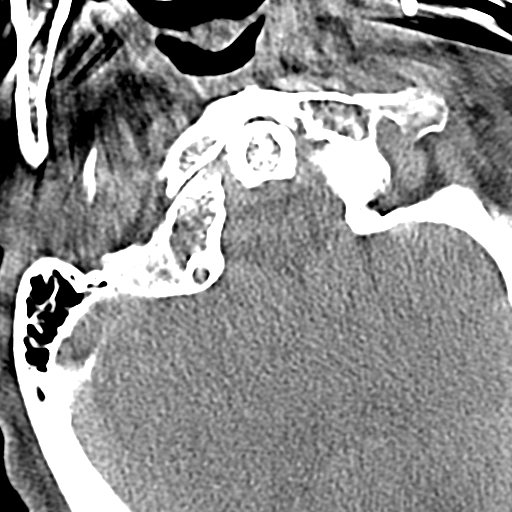
[im 70/84  bone]
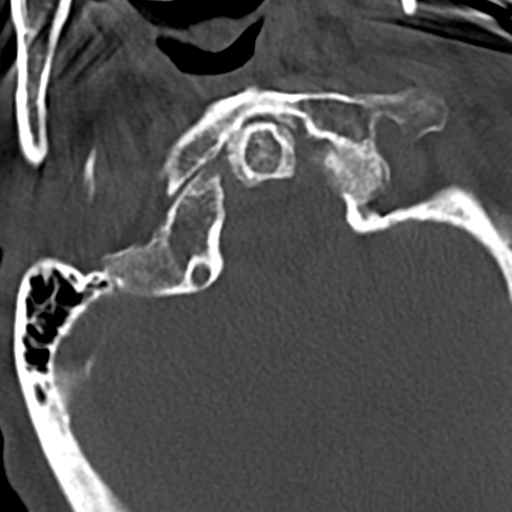

[13 of 33 positions shown; findings below may reference images not displayed]

FINDINGS: CT HEAD FINDINGS

Brain:

There are small foci of chronic encephalomalacia within the
posterior occipital lobes bilaterally (for instance as seen on
series 3, image 10).

Mild ill-defined hypoattenuation within the cerebral white matter is
nonspecific, but consistent with chronic small vessel ischemic
disease.

Mild generalized parenchymal atrophy.

There is no acute intracranial hemorrhage.

No acute demarcated cortical infarct.

No extra-axial fluid collection.

No evidence of intracranial mass.

No midline shift.

Vascular: No hyperdense vessel.  Atherosclerotic calcifications.

Skull: Normal. Negative for fracture or focal lesion.

Sinuses/Orbits: Visualized orbits show no acute finding. Mild
ethmoid sinus mucosal thickening. Small left mastoid effusion.

Other: Small left occipital scalp laceration with associated
subcutaneous gas.

CT CERVICAL SPINE FINDINGS

The examination is mildly motion degraded.

Alignment: Rightward rotation of C1 relative to C2, which may be
related to patient head positioning at the time of examination.
Straightening of the expected cervical lordosis. There is mild grade
1 anterolisthesis at C2-C3, C3-C4 and C4-C5. Mild C5-C6 grade 1
retrolisthesis. Mild C7-T1 grade 1 anterolisthesis.

Skull base and vertebrae: The basion-dental and atlanto-dental
intervals are maintained.No evidence of acute fracture to the
cervical spine.

Soft tissues and spinal canal: No prevertebral fluid or swelling. No
visible canal hematoma.

Disc levels: Cervical spondylosis with multilevel disc space
narrowing, posterior disc osteophytes, uncovertebral and facet
hypertrophy. Of note, there is severe disc degeneration at C5-C6 and
C6-C7.

Upper chest: No consolidation within the imaged lung apices. No
visible pneumothorax
IMPRESSION: CT head:

1. No evidence of acute intracranial abnormality.
2. Small left occipital scalp laceration.
3. Small foci of chronic encephalomalacia within the posterior
occipital lobes bilaterally.
4. Mild generalized parenchymal atrophy and chronic small vessel
ischemic disease.
5. Mild ethmoid sinus mucosal thickening.
6. Small left mastoid effusion.

CT cervical spine:

1. Mildly motion degraded examination.
2. No evidence of acute fracture to the cervical spine.
3. Rightward rotation of C1 relative to C2, which may be related to
patient head positioning at the time of examination.
4. Mild multilevel spondylolisthesis .
5. Cervical spondylosis as described.

## 2019-07-15 IMAGING — CT CT HEAD W/O CM
4 series · 15 of 47 positions shown, 17 images · non-contrast
Comparison: No pertinent prior studies available for comparison.

CLINICAL DATA: Head trauma, minor. Neck trauma. Additional
provided: Fall, positive loss of consciousness, laceration to back
of head.

EXAM:
CT HEAD WITHOUT CONTRAST
CT CERVICAL SPINE WITHOUT CONTRAST
TECHNIQUE: Multidetector CT imaging of the head and cervical spine was
performed following the standard protocol without intravenous
contrast. Multiplanar CT image reconstructions of the cervical spine
were also generated.

[Series 3: head wo · axial · 0.42mm/px · z∈[+1139,+1259]mm · 7 of 34 slices shown, 9 images]
[im 5/34  brain]
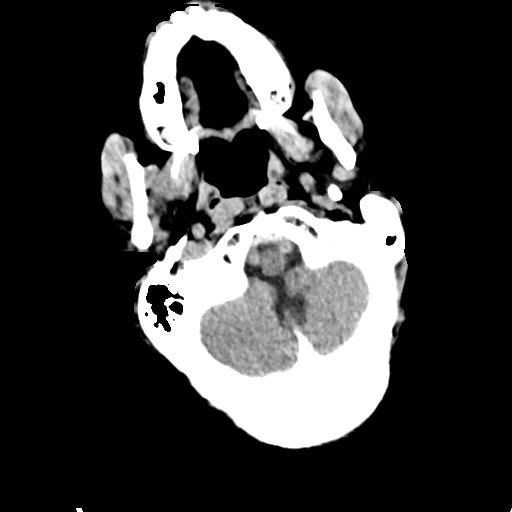
[im 5/34  bone]
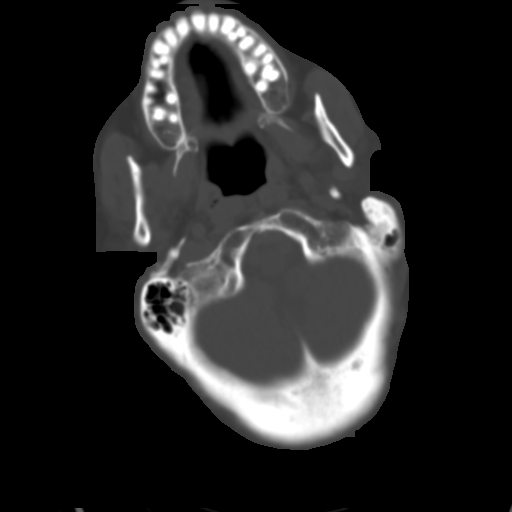
[im 9/34  brain]
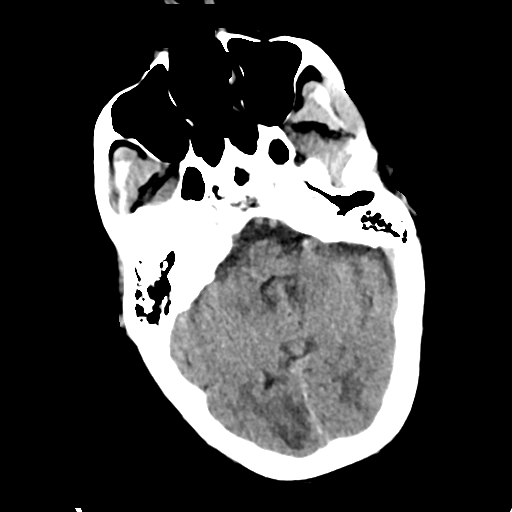
[im 13/34  brain]
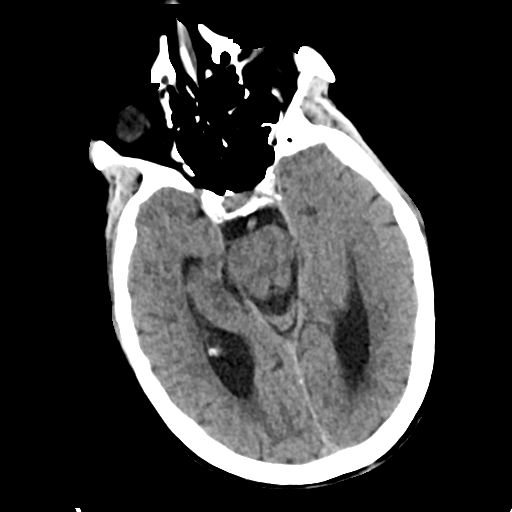
[im 17/34  brain]
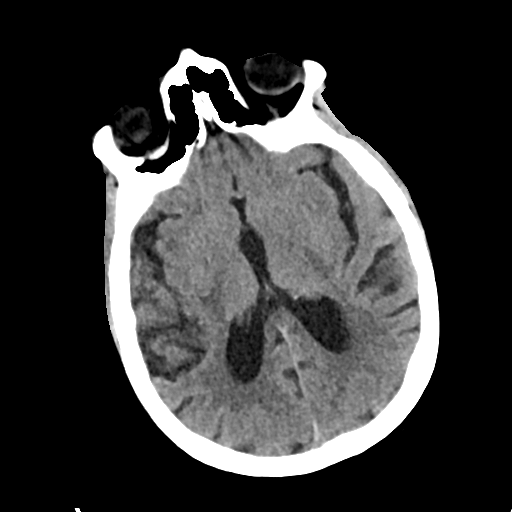
[im 21/34  brain]
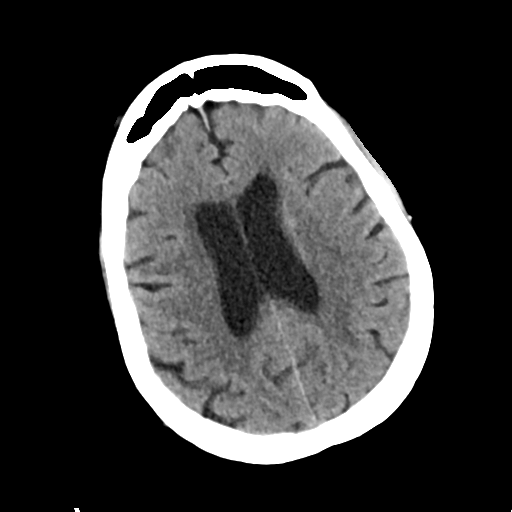
[im 21/34  bone]
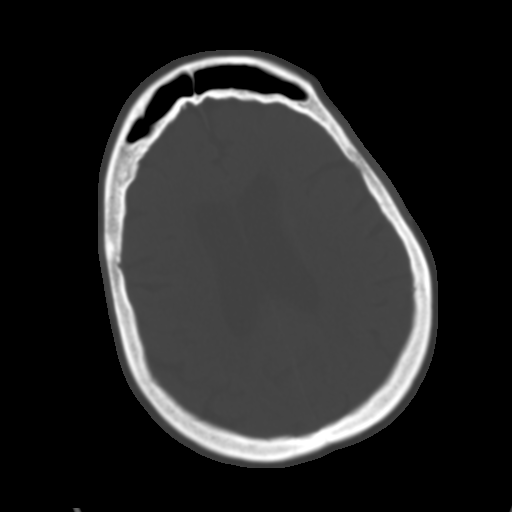
[im 25/34  brain]
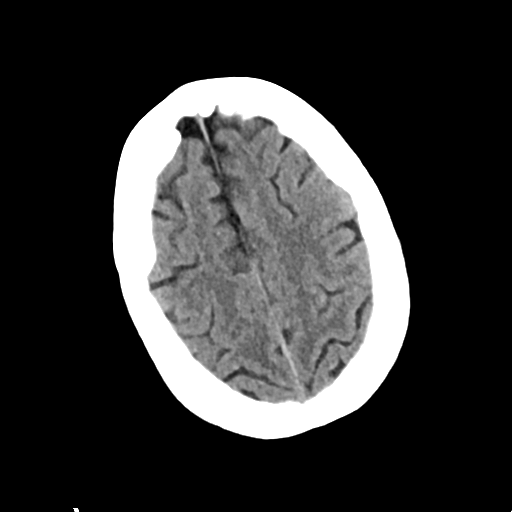
[im 29/34  brain]
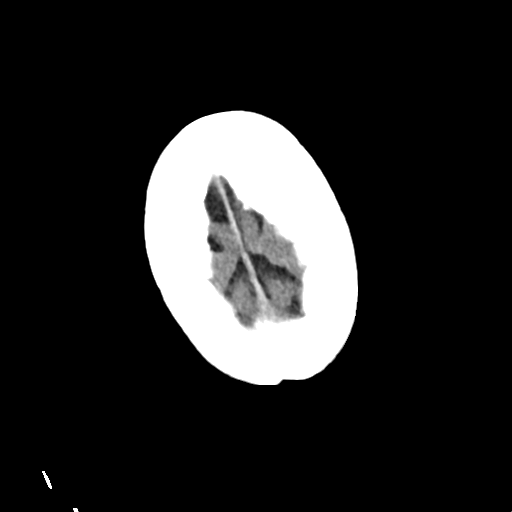

[Series 4: head bone · axial · 0.42mm/px · z∈[+1135,+1151]mm · 2 of 85 slices shown]
[im 9/85  bone]
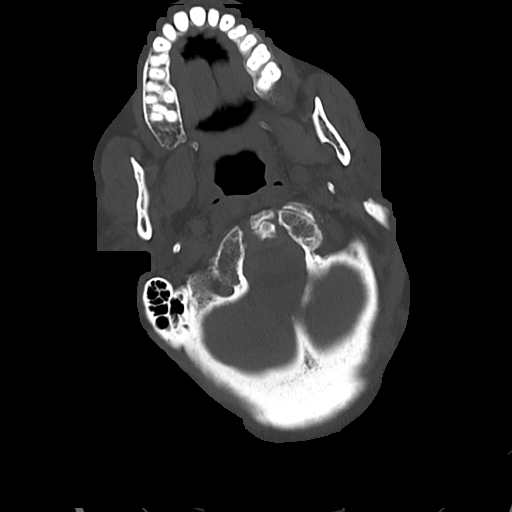
[im 17/85  bone]
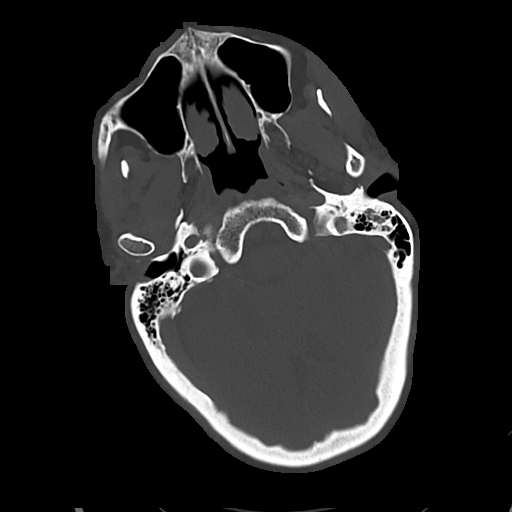

[Series 5: cor soft · coronal · 0.32mm/px · 3 of 72 slices shown]
[im 24/72  brain]
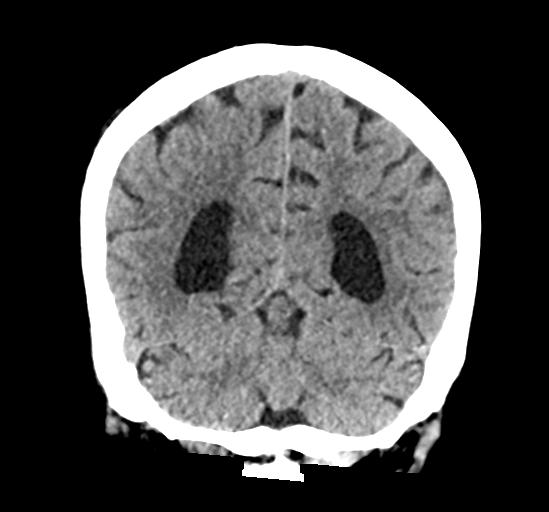
[im 32/72  brain]
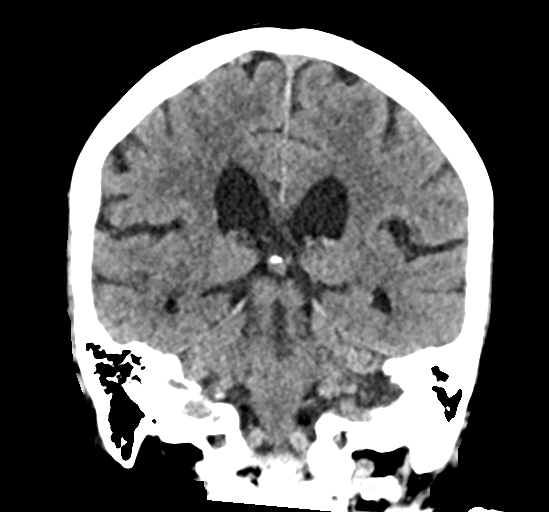
[im 40/72  brain]
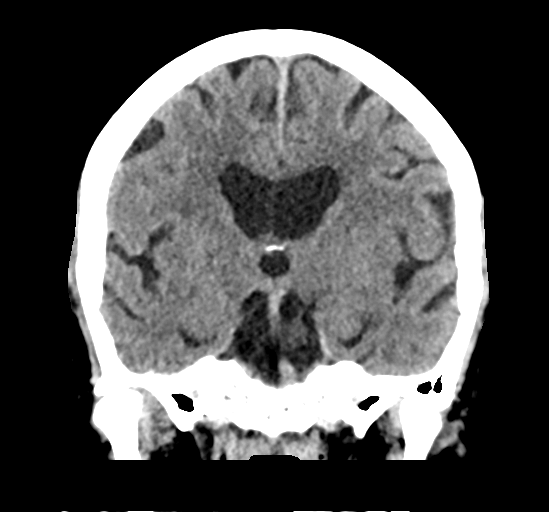

[Series 6: sag soft · sagittal · 0.33mm/px · 3 of 56 slices shown]
[im 19/56  brain]
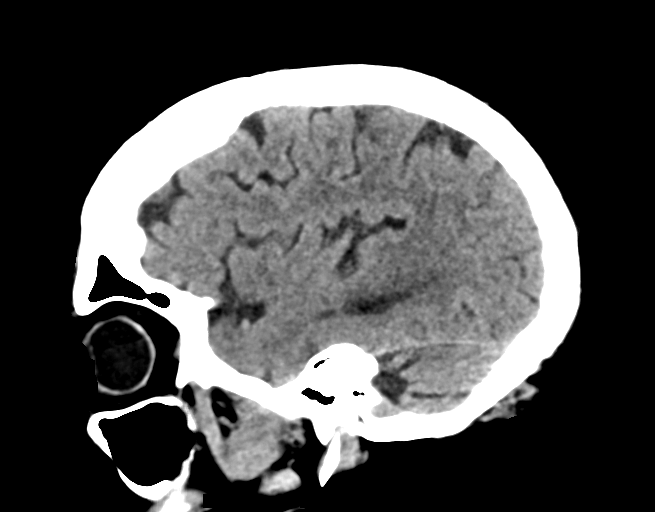
[im 28/56  brain]
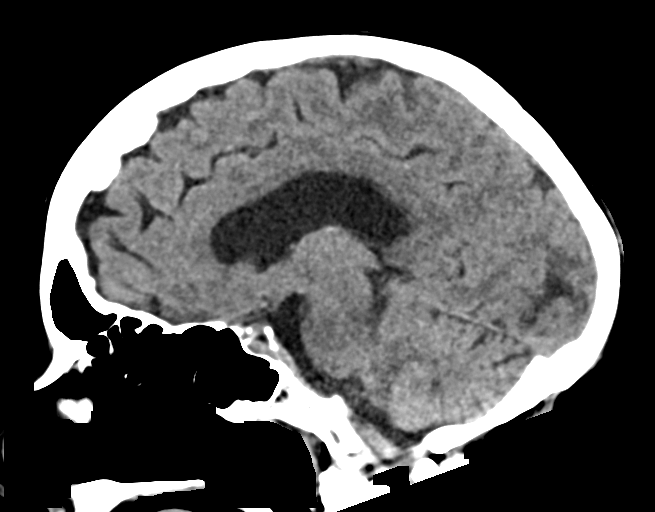
[im 37/56  brain]
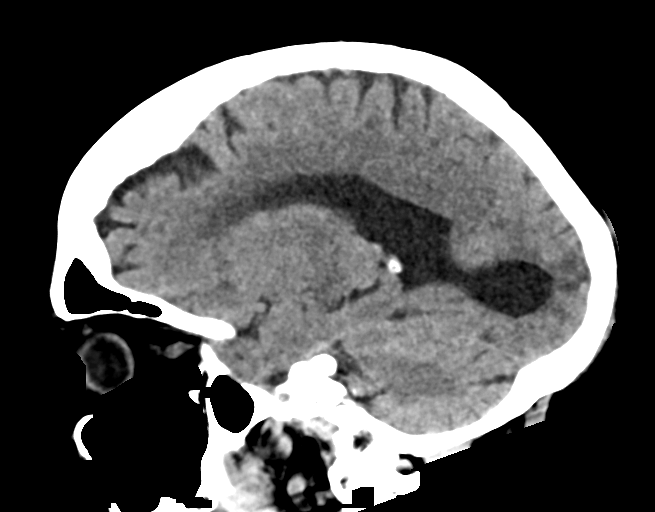

[15 of 47 positions shown; findings below may reference images not displayed]

FINDINGS: CT HEAD FINDINGS

Brain:

There are small foci of chronic encephalomalacia within the
posterior occipital lobes bilaterally (for instance as seen on
series 3, image 10).

Mild ill-defined hypoattenuation within the cerebral white matter is
nonspecific, but consistent with chronic small vessel ischemic
disease.

Mild generalized parenchymal atrophy.

There is no acute intracranial hemorrhage.

No acute demarcated cortical infarct.

No extra-axial fluid collection.

No evidence of intracranial mass.

No midline shift.

Vascular: No hyperdense vessel.  Atherosclerotic calcifications.

Skull: Normal. Negative for fracture or focal lesion.

Sinuses/Orbits: Visualized orbits show no acute finding. Mild
ethmoid sinus mucosal thickening. Small left mastoid effusion.

Other: Small left occipital scalp laceration with associated
subcutaneous gas.

CT CERVICAL SPINE FINDINGS

The examination is mildly motion degraded.

Alignment: Rightward rotation of C1 relative to C2, which may be
related to patient head positioning at the time of examination.
Straightening of the expected cervical lordosis. There is mild grade
1 anterolisthesis at C2-C3, C3-C4 and C4-C5. Mild C5-C6 grade 1
retrolisthesis. Mild C7-T1 grade 1 anterolisthesis.

Skull base and vertebrae: The basion-dental and atlanto-dental
intervals are maintained.No evidence of acute fracture to the
cervical spine.

Soft tissues and spinal canal: No prevertebral fluid or swelling. No
visible canal hematoma.

Disc levels: Cervical spondylosis with multilevel disc space
narrowing, posterior disc osteophytes, uncovertebral and facet
hypertrophy. Of note, there is severe disc degeneration at C5-C6 and
C6-C7.

Upper chest: No consolidation within the imaged lung apices. No
visible pneumothorax
IMPRESSION: CT head:

1. No evidence of acute intracranial abnormality.
2. Small left occipital scalp laceration.
3. Small foci of chronic encephalomalacia within the posterior
occipital lobes bilaterally.
4. Mild generalized parenchymal atrophy and chronic small vessel
ischemic disease.
5. Mild ethmoid sinus mucosal thickening.
6. Small left mastoid effusion.

CT cervical spine:

1. Mildly motion degraded examination.
2. No evidence of acute fracture to the cervical spine.
3. Rightward rotation of C1 relative to C2, which may be related to
patient head positioning at the time of examination.
4. Mild multilevel spondylolisthesis .
5. Cervical spondylosis as described.

## 2019-07-15 IMAGING — DX DG HIP (WITH OR WITHOUT PELVIS) 2-3V*L*
3 series · 3 of 3 positions shown · non-contrast
Comparison: None.

CLINICAL DATA: Left hip pain after fall.

EXAM:
DG HIP (WITH OR WITHOUT PELVIS) 2-3V LEFT

[t pelvis ap]
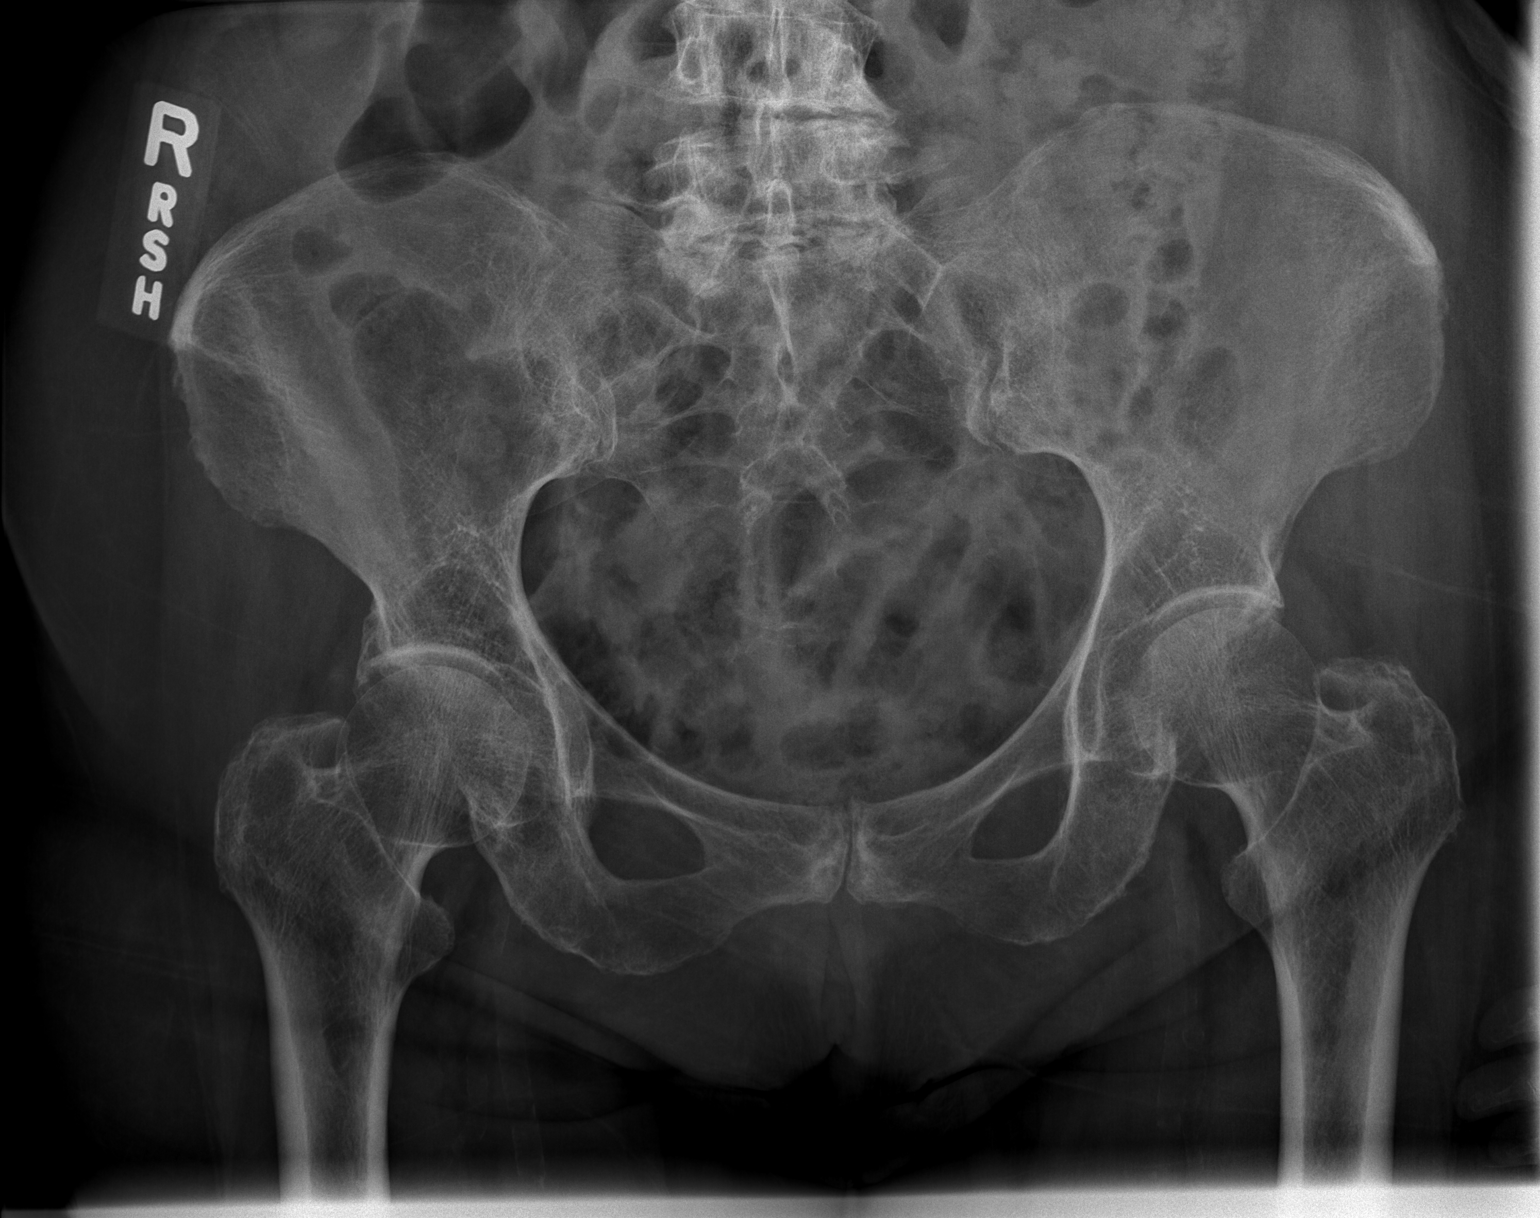

[t hip ap left]
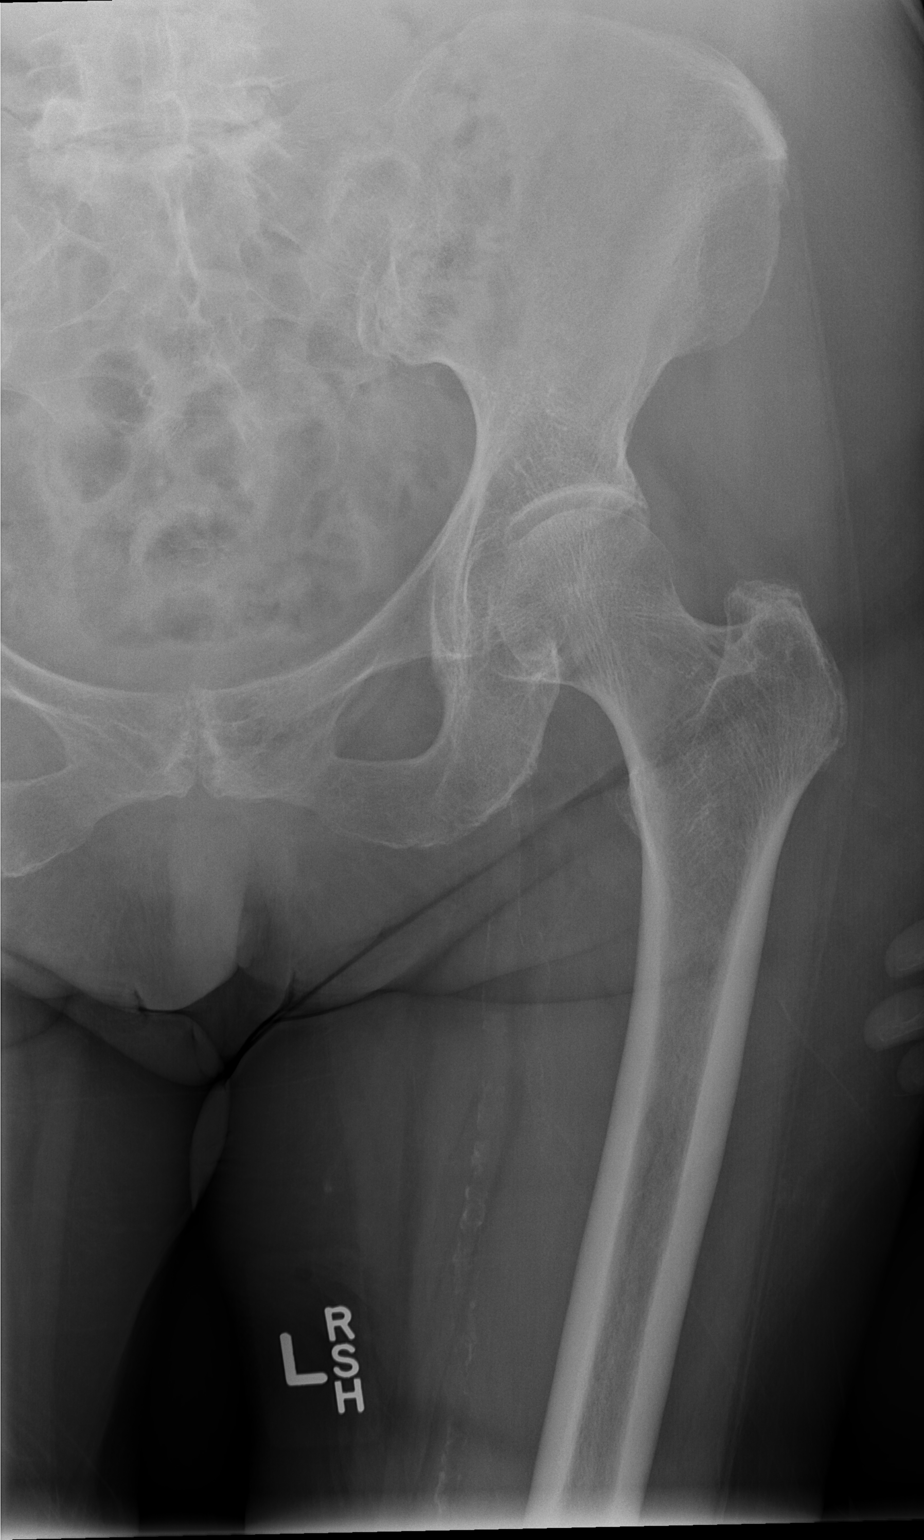

[t hip frog leg left]
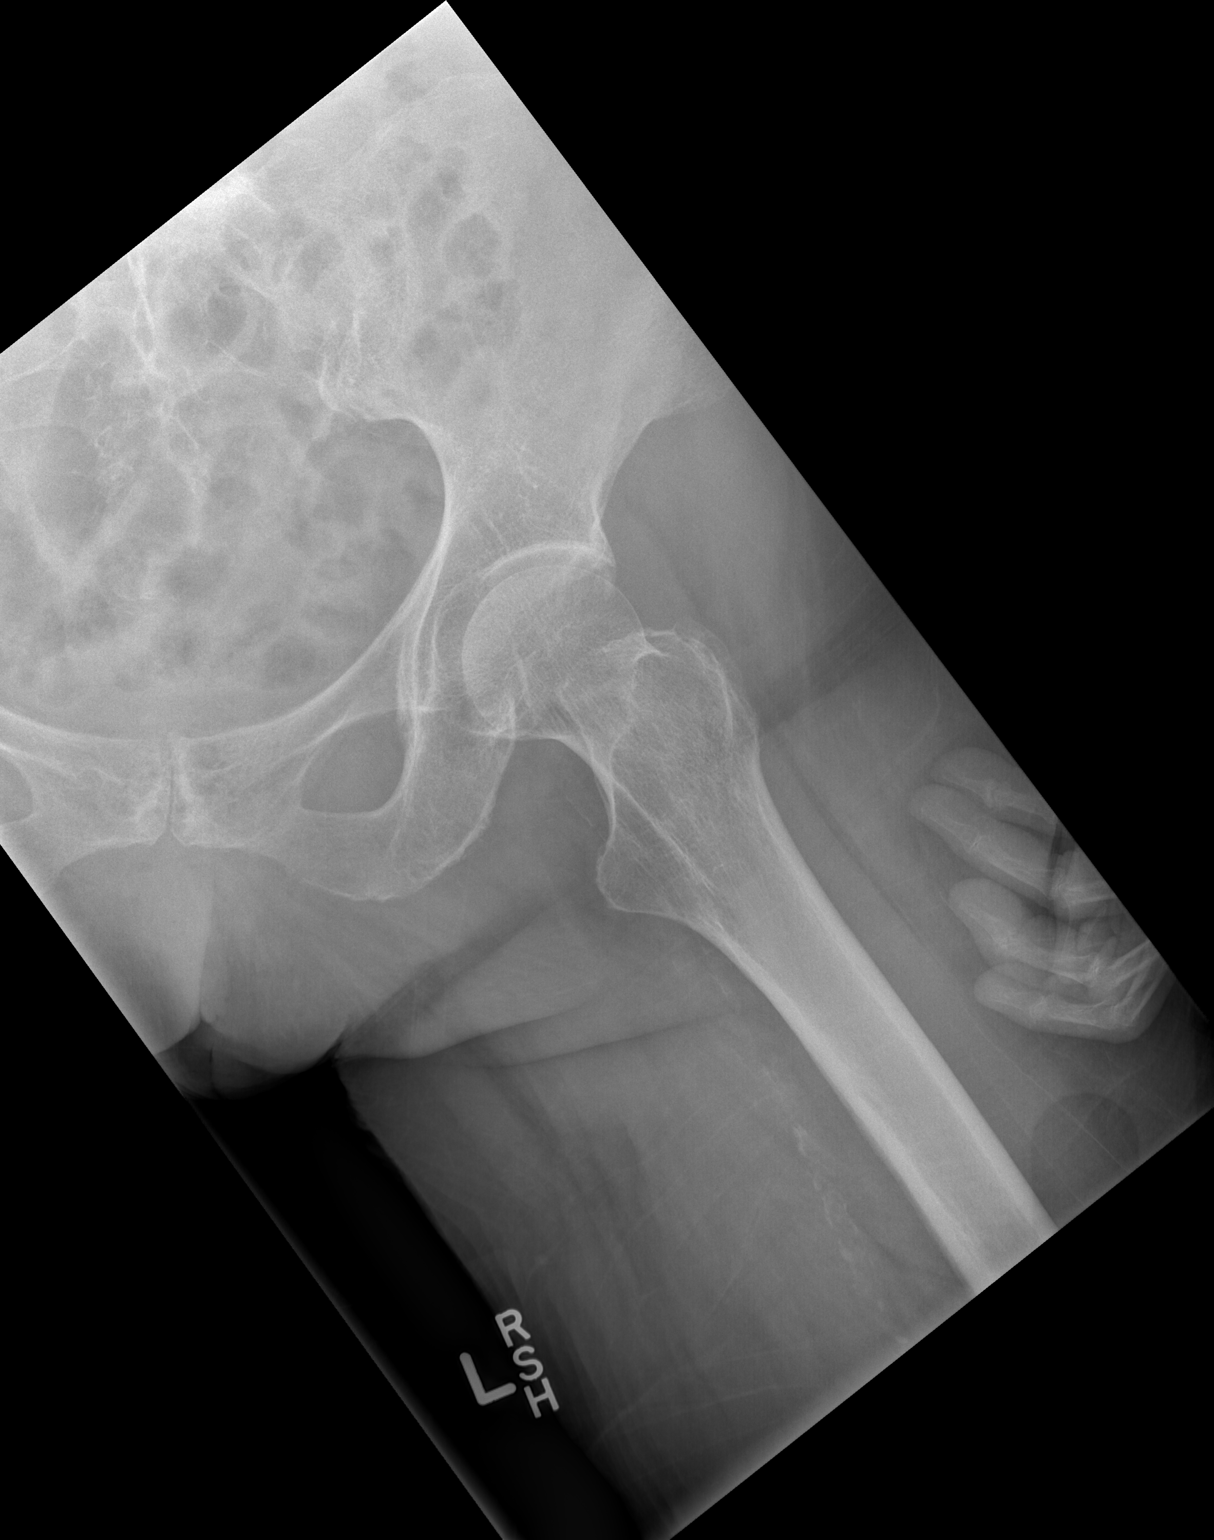

[3 of 3 positions shown; findings below may reference images not displayed]

FINDINGS: There is no evidence of hip fracture or dislocation. There is no
evidence of arthropathy or other focal bone abnormality.
IMPRESSION: Negative.

## 2019-07-15 IMAGING — DX DG FEMUR 2+V*L*
4 series · 4 of 4 positions shown · non-contrast
Comparison: None.

CLINICAL DATA: Left hip pain after fall.

EXAM:
LEFT FEMUR 2 VIEWS

[t femur proximal ap left]
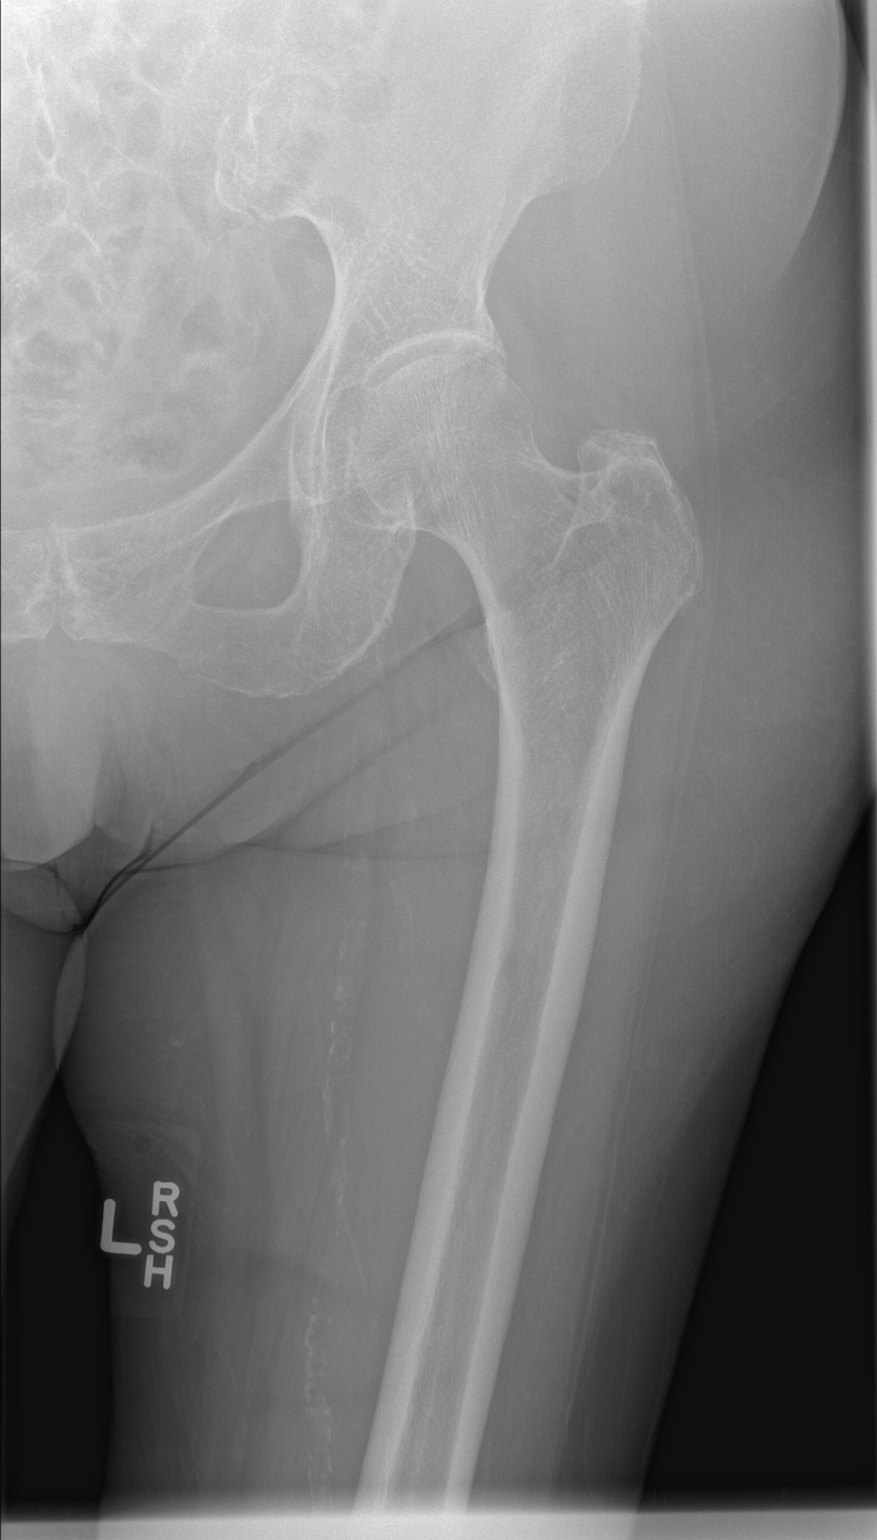

[t femur distal ap left]
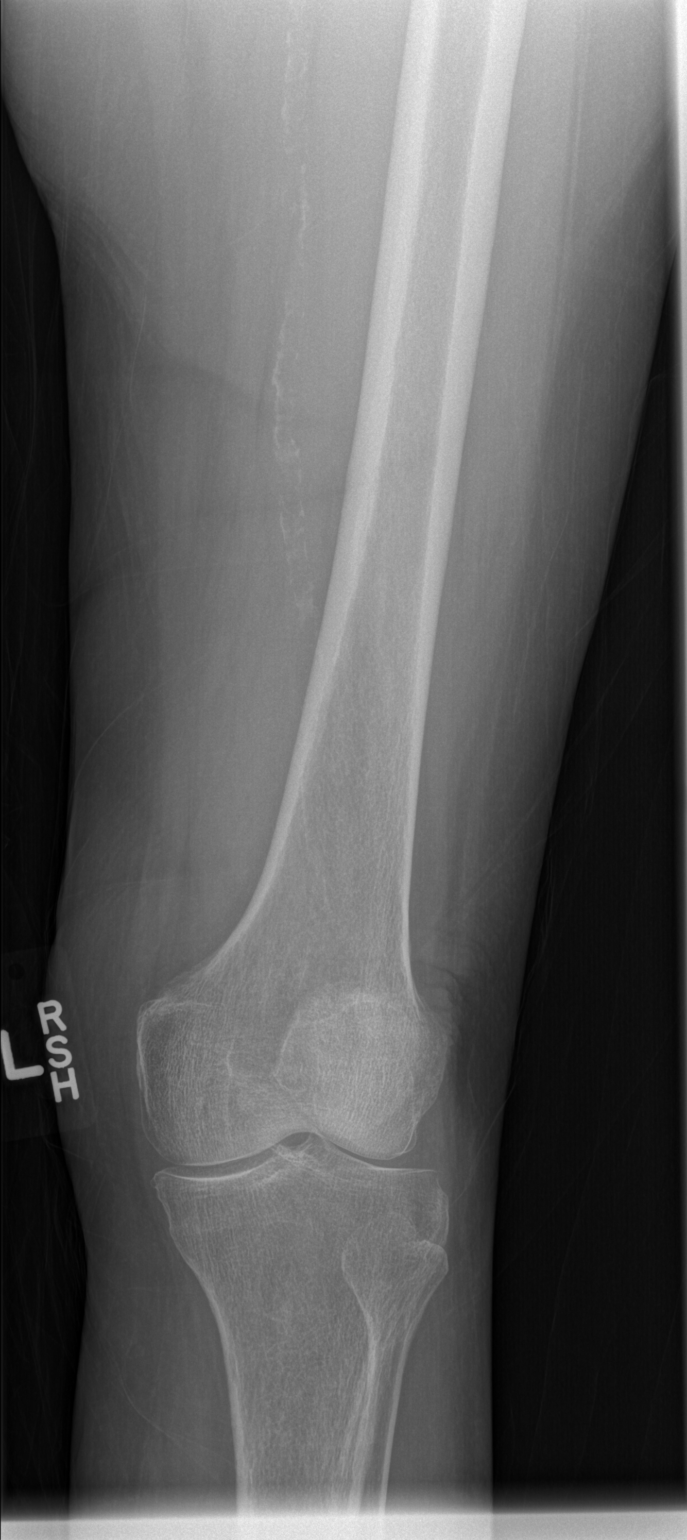

[t femur proximal lat left]
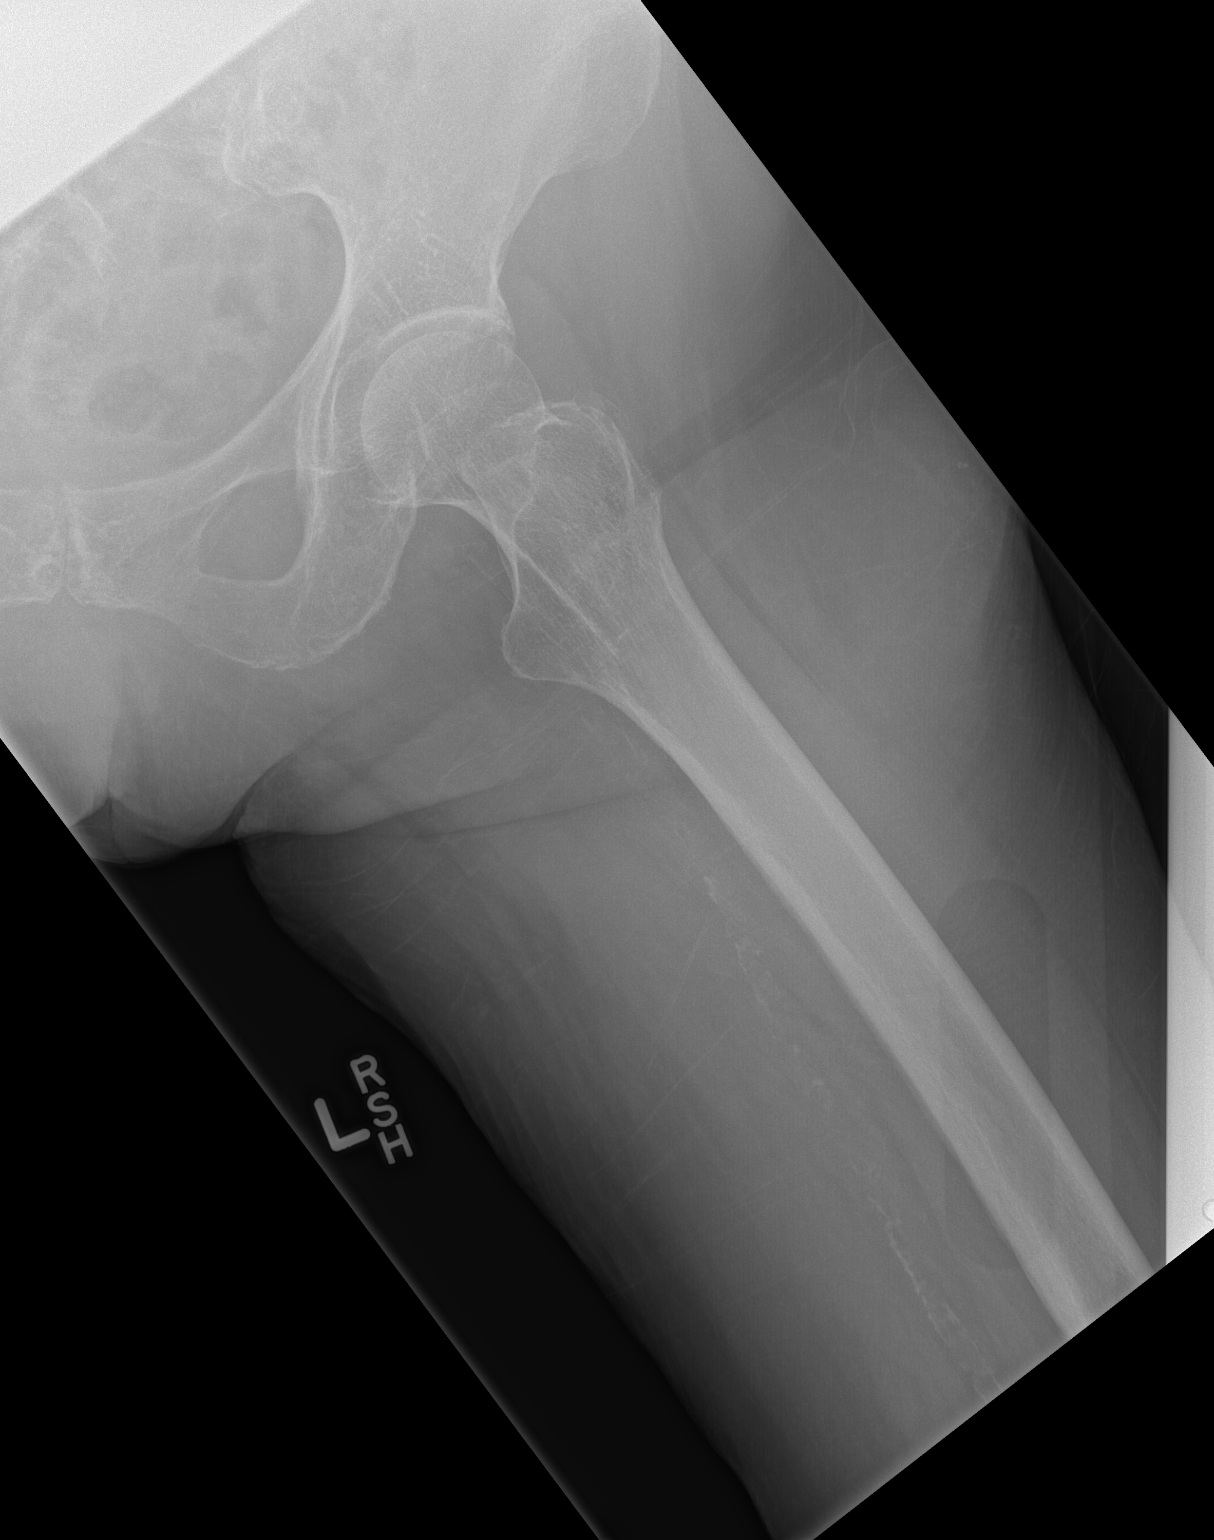

[x femur distal lat left]
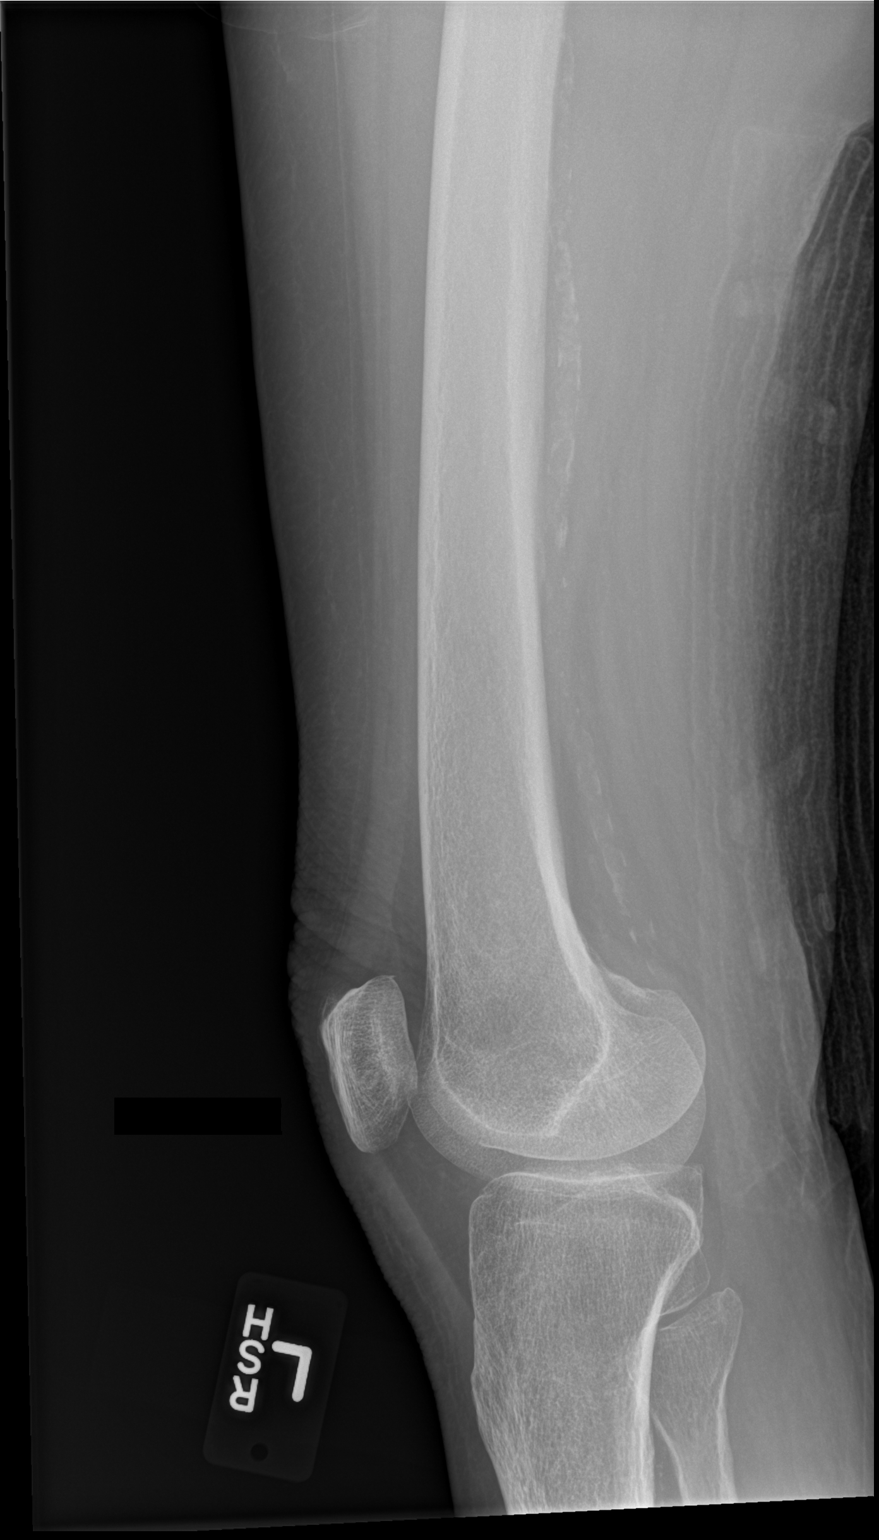

[4 of 4 positions shown; findings below may reference images not displayed]

FINDINGS: There is no evidence of fracture or other focal bone lesions.
Vascular calcifications are noted.
IMPRESSION: Negative.

## 2019-07-15 IMAGING — DX DG CHEST 2V
2 series · 2 of 2 positions shown · non-contrast
Comparison: [DATE]

CLINICAL DATA: Status post fall, confused

EXAM:
CHEST - 2 VIEW

[x chest ap]
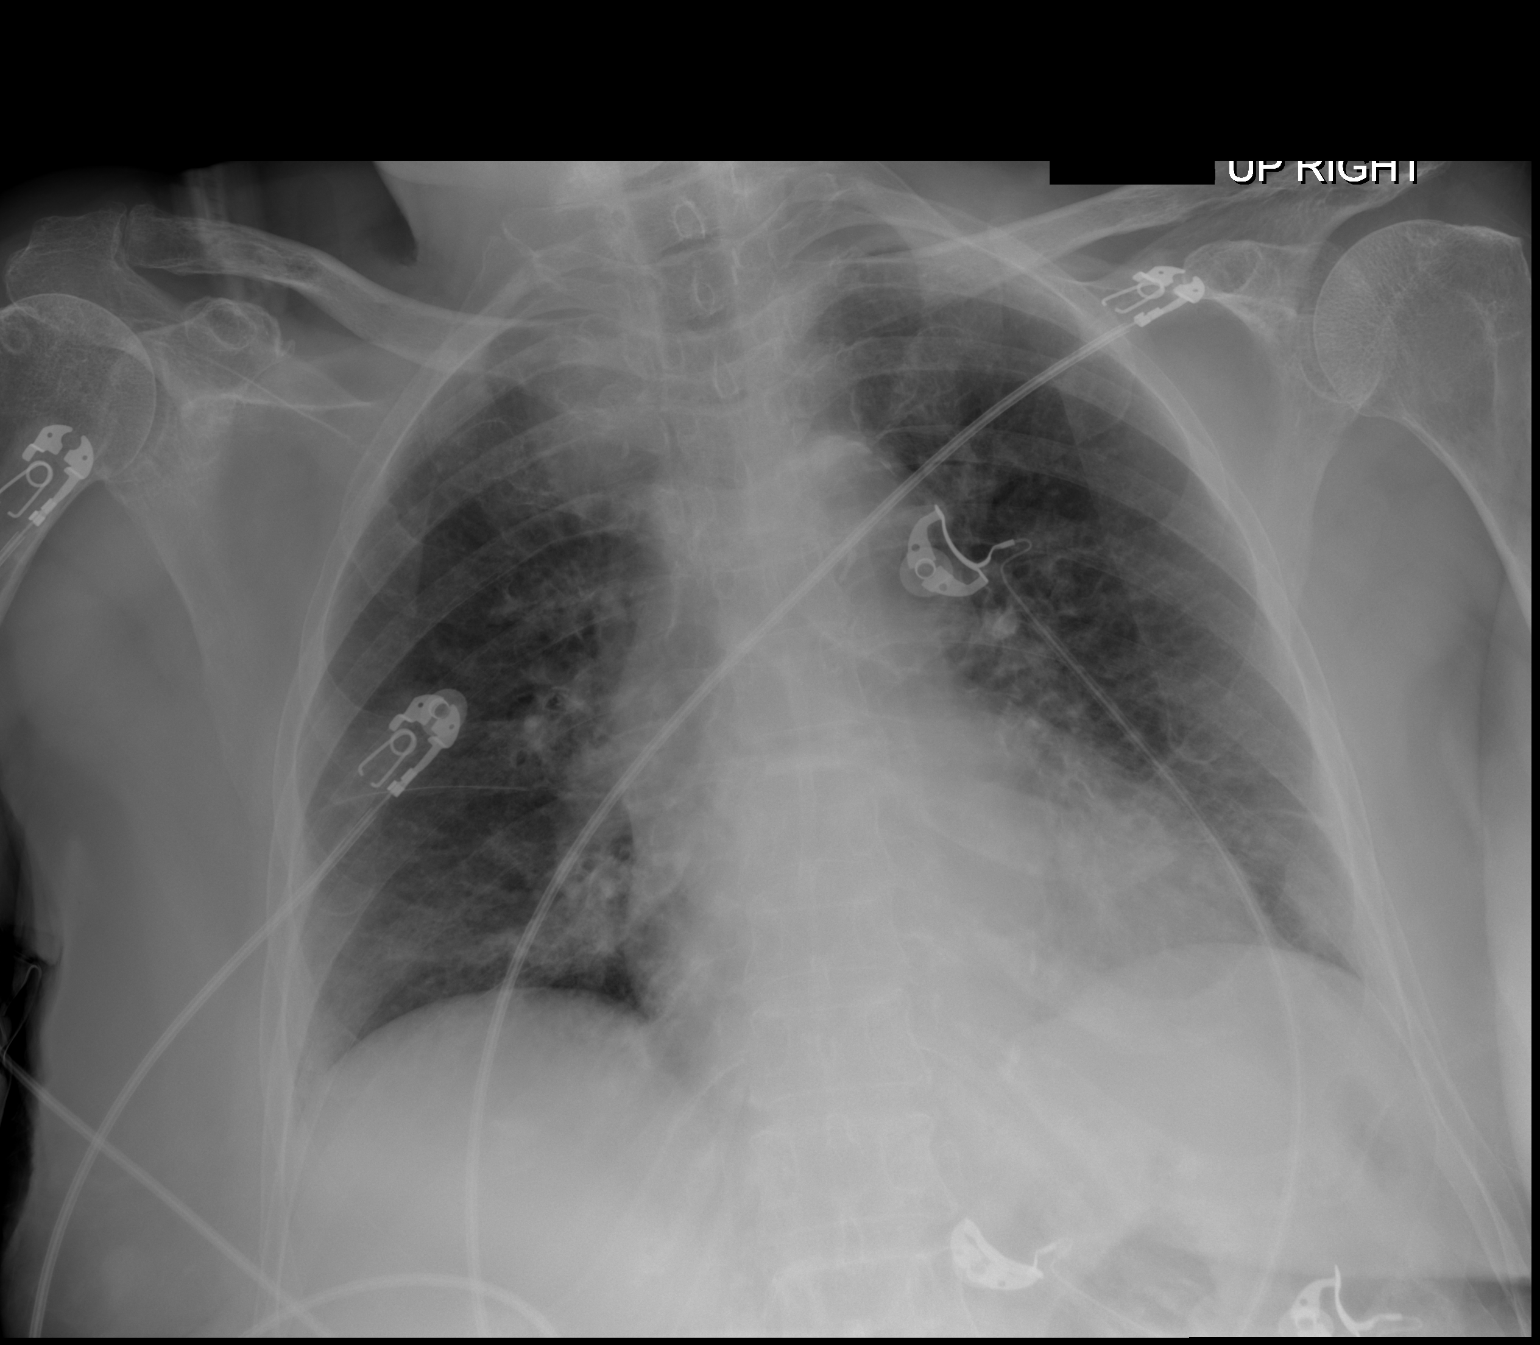

[w chest lat]
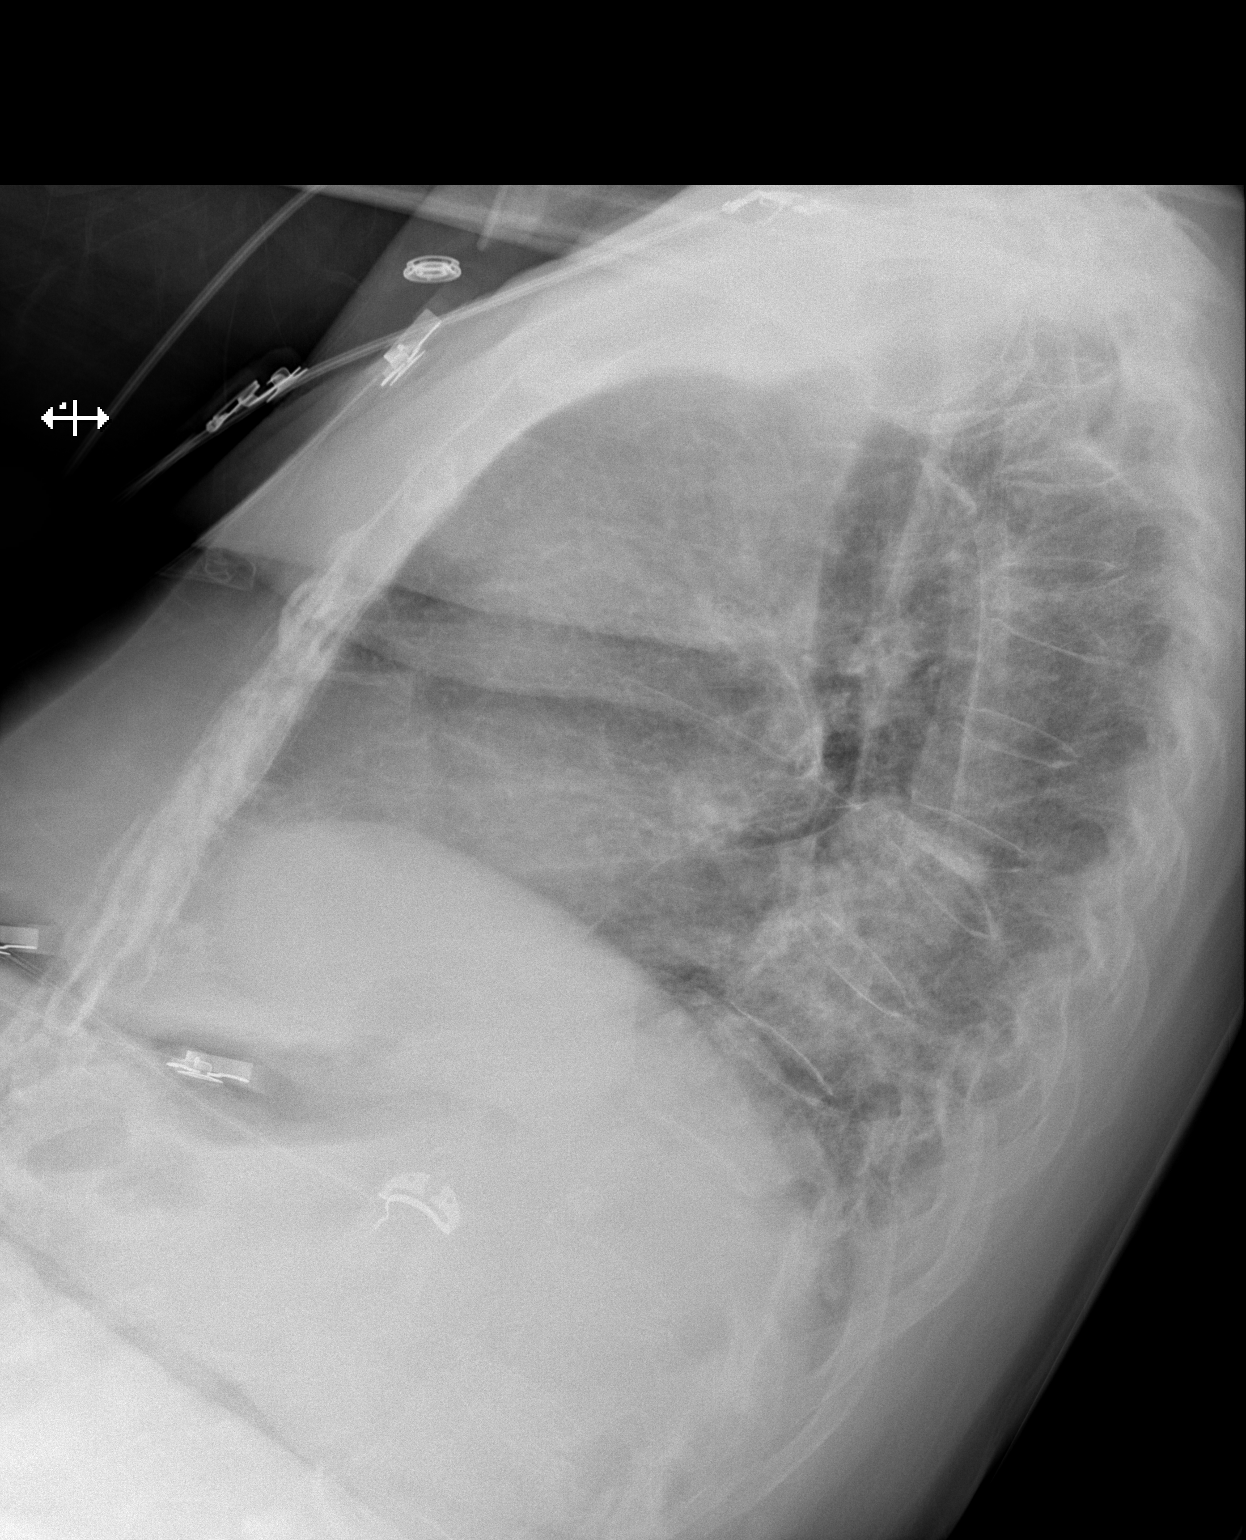

[2 of 2 positions shown; findings below may reference images not displayed]

FINDINGS: Hazy bilateral lower lobe airspace disease. No other focal
consolidation, pleural effusion or pneumothorax. Stable
cardiomediastinal silhouette. No aggressive osseous lesion.
IMPRESSION: Hazy bilateral lower lobe airspace disease which may reflect
atelectasis versus pneumonia.

## 2019-07-15 MED ORDER — TETANUS-DIPHTH-ACELL PERTUSSIS 5-2.5-18.5 LF-MCG/0.5 IM SUSP
0.5000 mL | Freq: Once | INTRAMUSCULAR | Status: AC
  Administered 2019-07-15: 0.5 mL via INTRAMUSCULAR
  Filled 2019-07-15: qty 0.5

## 2019-07-15 MED ORDER — ACETAMINOPHEN 325 MG PO TABS
650.0000 mg | ORAL_TABLET | Freq: Once | ORAL | Status: AC
  Administered 2019-07-15: 650 mg via ORAL
  Filled 2019-07-15: qty 2

## 2019-07-15 MED ORDER — POTASSIUM CHLORIDE CRYS ER 20 MEQ PO TBCR: 40.0000 meq | EXTENDED_RELEASE_TABLET | Freq: Once | ORAL | Status: DC

## 2019-07-15 MED ORDER — LIDOCAINE-EPINEPHRINE-TETRACAINE (LET) TOPICAL GEL
3.0000 mL | Freq: Once | TOPICAL | Status: AC
  Administered 2019-07-15: 12:00:00 3 mL via TOPICAL
  Filled 2019-07-15: qty 3

## 2019-07-15 NOTE — ED Triage Notes (Signed)
Pt arrives via EMS from [facility] with complaints of witnessed fall and AMS. Pt poor historian. Per EMS pt was walking to breakfast with her walker and fell backwards. Possible LOC with small laceration on back of head. Bleeding controlled. Pt appeared to have seizure like activity with mouth following fall. Pt placed Pt normally A/O X4 however with EMS pt only alert to self and place. During triage pt alert to self only.   168/78 P:76 99% RA 97.5 T

## 2019-07-15 NOTE — Discharge Instructions (Addendum)
Your labwork and imaging was reassuring today. No fractures anywhere.  You will need to have staples removed in 1 week. You can return to the ED or have them removed at your assisted living facility. Keep wound clean and dry until then. Your tetanus was updated in the ED.  Return to the ED for any worsening symptoms including severe headache, vision changes, weakness or numbness on one side of your body, confusion

## 2019-07-15 NOTE — ED Notes (Addendum)
Rosine Door ( cousin) [phone] states he is her POA. Please  Call when patient is ready for disposition.

## 2019-07-15 NOTE — ED Notes (Signed)
Pt transported to CT ?

## 2019-07-15 NOTE — ED Provider Notes (Signed)
[facility]ERGENCY DEPARTMENT Provider Note   CSN: [id number] Arrival date & time: 07/15/19  0935     History Chief Complaint  Patient presents with  . Fall  . Altered Mental Status    [name] is a 86 y.o. female with PMHx HTN, hypothyroidism, gout, depression who presents to the ED via EMS from [facility] with complaints of witnesses fall/AMS. Per EMS/Triage report pt was walking to breakfast with her walker when she fell backwards. Staff reported pt appearing "dizzy" before falling. Pt his the back of her head with possible LOC - small laceration noted to back of head on arrival, bleeding controlled. Pt is not anticoagulated. Per EMS staff reports "seizure like activity" with mouth "smacking" after fall. Per staff pt is normally alert and oriented x 4 however with EMS pt is only alert to self and place.   Pt is unsure how she fell. She continues to ask if she fell and what is happening to her.    Additional information provided by staff at [facility]: Pt was ambulating to the food hall when she stopped and then fell backwards hitting her head on the coffee table and then hitting her neck on a flower pot. No LOC. Staff reports pt immediately opened eye and seemed confused. They mention that she appeared "cyanotic" with lips turning blue and O2 sats 84%. Placed on 2L O2 with improvement however pt continued to be altered prompting them to call EMS. When EMS arrived pt was satting 99% without O2. Pt lives in assisted living side however is independent; will dress and feed herself without difficulty.   The history is provided by the patient, medical records and the EMS personnel.       Past Medical History:  Diagnosis Date  . Cognitive changes   . Depression   . Gout   . Hypertension   . Hypothyroidism   . Peripheral neuropathy     Patient Active Problem List   Diagnosis Date Noted  . Weight gain 03/16/2019  . Hypothyroidism 10/08/2018  . HTN  (hypertension) 10/08/2018  . Gout 10/08/2018  . Peripheral neuropathy 10/08/2018    No past surgical history on file.   OB History   No obstetric history on file.     No family history on file.  Social History   Tobacco Use  . Smoking status: Not on file  Substance Use Topics  . Alcohol use: Not on file  . Drug use: Not on file    Home Medications Prior to Admission medications   Medication Sig Start Date End Date Taking? Authorizing Provider  allopurinol (ZYLOPRIM) 100 MG tablet Take 100 mg by mouth daily.   Yes [provider]  benazepril (LOTENSIN) 10 MG tablet Take 10 mg by mouth daily.   Yes [provider]  Calcium Carb-Cholecalciferol 500-400 MG-UNIT TABS Take 2 tablets by mouth daily.    Yes [provider]  Cholecalciferol (VITAMIN D3) 50 MCG (2000 UT) capsule Take 2,000 Units by mouth daily.    Yes [provider]  Coenzyme Q10 (COQ10) 100 MG CAPS Take 2 capsules by mouth daily.   Yes [provider]  colchicine 0.6 MG tablet Take 0.6 mg by mouth 2 (two) times daily as needed (gout flares).    Yes [provider]  levothyroxine (SYNTHROID) 100 MCG tablet Take 100 mcg by mouth once a week. On Wednesday   Yes [provider]  levothyroxine (SYNTHROID) 50 MCG tablet Take  50 mcg by mouth daily before breakfast. Sun,Mon,Tue, Thur,Fri,Sat   Yes [provider]  Omega-3 1000 MG CAPS Take 2 capsules by mouth 2 (two) times daily.    Yes [provider]  pregabalin (LYRICA) 50 MG capsule Take 50 mg by mouth 2 (two) times daily as needed.   Yes [provider]    Allergies    Actonel [risedronate sodium], Hct [hydrochlorothiazide], Lipitor [atorvastatin calcium], Neomycin, Polysporin [bacitracin-polymyxin b], Prolia [denosumab], Sulfa antibiotics, and Zocor [simvastatin]  Review of Systems   Review of Systems  Physical Exam Updated Vital Signs BP (!) 145/59 (BP Location: Left Arm)    Pulse 70   Temp 98.5 F (36.9 C) (Oral)   Resp 15   SpO2 100%   Physical Exam Vitals and nursing note reviewed.  Constitutional:      Appearance: She is not ill-appearing or diaphoretic.  HENT:     Head: Normocephalic.     Comments: Dried blood noted to hair; difficult to visualize laceration currently with C collar in place No raccoon's sign or battle's sign. Negative hemotympanum bilaterally.     Right Ear: Tympanic membrane normal.     Left Ear: Tympanic membrane normal.     Mouth/Throat:     Mouth: Mucous membranes are moist.  Eyes:     Extraocular Movements: Extraocular movements intact.     Conjunctiva/sclera: Conjunctivae normal.     Pupils: Pupils are equal, round, and reactive to light.  Neck:     Comments: C collar in place Cardiovascular:     Rate and Rhythm: Normal rate and regular rhythm.     Pulses: Normal pulses.  Pulmonary:     Effort: Pulmonary effort is normal.     Breath sounds: Normal breath sounds. No wheezing, rhonchi or rales.  Abdominal:     Palpations: Abdomen is soft.     Tenderness: There is no abdominal tenderness. There is no guarding or rebound.  Musculoskeletal:     Cervical back: Neck supple.     Comments: Ecchymosis noted to left lateral thigh proximally with TTP. No obvious tenderness illicited with log roll and flexion of hip. No tenderness to distal femur, knee, tib/fib, ankle, or foot on left side. 2+ DP pulse. Compartments are soft.   + L midline spinal TTP. No T midline spinal TTP.   No tenderness to remainder of joints.   Skin:    General: Skin is warm and dry.  Neurological:     Mental Status: She is alert.     Comments: Pt is alert to self and place. When asked the year she looks at the board where the date is written. She continues to ask what happened to her/if she fell.  CN 3-12 grossly intact GCS 15 Sensation and strength intact to BUE and BLEs      ED Results / Procedures / Treatments   Labs (all labs ordered are  listed, but only abnormal results are displayed) Labs Reviewed  COMPREHENSIVE METABOLIC PANEL - Abnormal; Notable for the following components:      Result Value   Potassium 3.3 (*)    Creatinine, Ser 1.03 (*)    Calcium 8.7 (*)    Total Protein 6.0 (*)    Albumin 3.1 (*)    GFR calc non Af Amer 47 (*)    GFR calc Af Amer 55 (*)    All other components within normal limits  CBC WITH DIFFERENTIAL/PLATELET - Abnormal; Notable for the following components:   Lymphs  Abs 0.5 (*)    All other components within normal limits  URINALYSIS, ROUTINE W REFLEX MICROSCOPIC - Abnormal; Notable for the following components:   Color, Urine STRAW (*)    All other components within normal limits  MAGNESIUM  TROPONIN I (HIGH SENSITIVITY)  TROPONIN I (HIGH SENSITIVITY)    EKG None  Radiology DG Chest 2 View  Result Date: 07/15/2019 CLINICAL DATA:  Status post fall, confused EXAM: CHEST - 2 VIEW COMPARISON:  11/21/2009 FINDINGS: Hazy bilateral lower lobe airspace disease. No other focal consolidation, pleural effusion or pneumothorax. Stable cardiomediastinal silhouette. No aggressive osseous lesion. IMPRESSION: Hazy bilateral lower lobe airspace disease which may reflect atelectasis versus pneumonia. Electronically Signed   By: Kathreen Devoid   On: 07/15/2019 10:42   CT Head Wo Contrast  Result Date: 07/15/2019 CLINICAL DATA:  Head trauma, minor. Neck trauma. Additional provided: Fall, positive loss of consciousness, laceration to back of head. EXAM: CT HEAD WITHOUT CONTRAST CT CERVICAL SPINE WITHOUT CONTRAST TECHNIQUE: Multidetector CT imaging of the head and cervical spine was performed following the standard protocol without intravenous contrast. Multiplanar CT image reconstructions of the cervical spine were also generated. COMPARISON:  No pertinent prior studies available for comparison. FINDINGS: CT HEAD FINDINGS Brain: There are small foci of chronic encephalomalacia within the posterior occipital  lobes bilaterally (for instance as seen on series 3, image 10). Mild ill-defined hypoattenuation within the cerebral white matter is nonspecific, but consistent with chronic small vessel ischemic disease. Mild generalized parenchymal atrophy. There is no acute intracranial hemorrhage. No acute demarcated cortical infarct. No extra-axial fluid collection. No evidence of intracranial mass. No midline shift. Vascular: No hyperdense vessel.  Atherosclerotic calcifications. Skull: Normal. Negative for fracture or focal lesion. Sinuses/Orbits: Visualized orbits show no acute finding. Mild ethmoid sinus mucosal thickening. Small left mastoid effusion. Other: Small left occipital scalp laceration with associated subcutaneous gas. CT CERVICAL SPINE FINDINGS The examination is mildly motion degraded. Alignment: Rightward rotation of C1 relative to C2, which may be related to patient head positioning at the time of examination. Straightening of the expected cervical lordosis. There is mild grade 1 anterolisthesis at C2-C3, C3-C4 and C4-C5. Mild C5-C6 grade 1 retrolisthesis. Mild C7-T1 grade 1 anterolisthesis. Skull base and vertebrae: The basion-dental and atlanto-dental intervals are maintained.No evidence of acute fracture to the cervical spine. Soft tissues and spinal canal: No prevertebral fluid or swelling. No visible canal hematoma. Disc levels: Cervical spondylosis with multilevel disc space narrowing, posterior disc osteophytes, uncovertebral and facet hypertrophy. Of note, there is severe disc degeneration at C5-C6 and C6-C7. Upper chest: No consolidation within the imaged lung apices. No visible pneumothorax IMPRESSION: CT head: 1. No evidence of acute intracranial abnormality. 2. Small left occipital scalp laceration. 3. Small foci of chronic encephalomalacia within the posterior occipital lobes bilaterally. 4. Mild generalized parenchymal atrophy and chronic small vessel ischemic disease. 5. Mild ethmoid sinus  mucosal thickening. 6. Small left mastoid effusion. CT cervical spine: 1. Mildly motion degraded examination. 2. No evidence of acute fracture to the cervical spine. 3. Rightward rotation of C1 relative to C2, which may be related to patient head positioning at the time of examination. 4. Mild multilevel spondylolisthesis . 5. Cervical spondylosis as described. Electronically Signed   By: Kellie Simmering DO   On: 07/15/2019 11:54   CT Cervical Spine Wo Contrast  Result Date: 07/15/2019 CLINICAL DATA:  Head trauma, minor. Neck trauma. Additional provided: Fall, positive loss of consciousness, laceration to back of head. EXAM: CT  HEAD WITHOUT CONTRAST CT CERVICAL SPINE WITHOUT CONTRAST TECHNIQUE: Multidetector CT imaging of the head and cervical spine was performed following the standard protocol without intravenous contrast. Multiplanar CT image reconstructions of the cervical spine were also generated. COMPARISON:  No pertinent prior studies available for comparison. FINDINGS: CT HEAD FINDINGS Brain: There are small foci of chronic encephalomalacia within the posterior occipital lobes bilaterally (for instance as seen on series 3, image 10). Mild ill-defined hypoattenuation within the cerebral white matter is nonspecific, but consistent with chronic small vessel ischemic disease. Mild generalized parenchymal atrophy. There is no acute intracranial hemorrhage. No acute demarcated cortical infarct. No extra-axial fluid collection. No evidence of intracranial mass. No midline shift. Vascular: No hyperdense vessel.  Atherosclerotic calcifications. Skull: Normal. Negative for fracture or focal lesion. Sinuses/Orbits: Visualized orbits show no acute finding. Mild ethmoid sinus mucosal thickening. Small left mastoid effusion. Other: Small left occipital scalp laceration with associated subcutaneous gas. CT CERVICAL SPINE FINDINGS The examination is mildly motion degraded. Alignment: Rightward rotation of C1 relative to C2,  which may be related to patient head positioning at the time of examination. Straightening of the expected cervical lordosis. There is mild grade 1 anterolisthesis at C2-C3, C3-C4 and C4-C5. Mild C5-C6 grade 1 retrolisthesis. Mild C7-T1 grade 1 anterolisthesis. Skull base and vertebrae: The basion-dental and atlanto-dental intervals are maintained.No evidence of acute fracture to the cervical spine. Soft tissues and spinal canal: No prevertebral fluid or swelling. No visible canal hematoma. Disc levels: Cervical spondylosis with multilevel disc space narrowing, posterior disc osteophytes, uncovertebral and facet hypertrophy. Of note, there is severe disc degeneration at C5-C6 and C6-C7. Upper chest: No consolidation within the imaged lung apices. No visible pneumothorax IMPRESSION: CT head: 1. No evidence of acute intracranial abnormality. 2. Small left occipital scalp laceration. 3. Small foci of chronic encephalomalacia within the posterior occipital lobes bilaterally. 4. Mild generalized parenchymal atrophy and chronic small vessel ischemic disease. 5. Mild ethmoid sinus mucosal thickening. 6. Small left mastoid effusion. CT cervical spine: 1. Mildly motion degraded examination. 2. No evidence of acute fracture to the cervical spine. 3. Rightward rotation of C1 relative to C2, which may be related to patient head positioning at the time of examination. 4. Mild multilevel spondylolisthesis . 5. Cervical spondylosis as described. Electronically Signed   By: Kellie Simmering DO   On: 07/15/2019 11:54   CT Lumbar Spine Wo Contrast  Result Date: 07/15/2019 CLINICAL DATA:  Low back pain, trauma.  Additional provided: Fall. EXAM: CT LUMBAR SPINE WITHOUT CONTRAST TECHNIQUE: Multidetector CT imaging of the lumbar spine was performed without intravenous contrast administration. Multiplanar CT image reconstructions were also generated. COMPARISON:  CT abdomen/pelvis 11/15/2009 FINDINGS: Segmentation: 5 lumbar vertebrae.  Alignment: Mild lumbar levocurvature. Reversal of the expected lumbar lordosis centered at L3. There is 3 mm bony retropulsion at the L3 level. Trace L1-L2 grade 1 anterolisthesis. Mild L4-L5 and L5-S1 grade 1 retrolisthesis. Vertebrae: Chronic L3 compression fracture with approximately 25% height loss and mild bony retropulsion. This is unchanged as compared to CT abdomen/pelvis 11/15/2009. Vertebral body height is otherwise maintained. No evidence of acute fracture to the lumbar spine. Diffuse osseous demineralization. Paraspinal and other soft tissues: Aortoiliac atherosclerosis. Right renal calculi versus renovascular calcifications paraspinal soft tissues within normal limits. Disc levels: Multilevel disc space narrowing and disc vacuum phenomenon. Most notably, there is moderate disc space narrowing at the L3-L4, L4-L5 and L5-S1 levels. L1-L2: Disc bulge. Facet arthrosis. No appreciable significant spinal canal stenosis. Mild bilateral neural foraminal narrowing.  L2-L3: L3 bony retropulsion with disc bulge and facet arthrosis. Suspected moderate spinal canal stenosis. No significant foraminal narrowing. L3-L4: L3 bony retropulsion. Disc bulge. Facet arthrosis/ligamentum flavum hypertrophy. Suspected at least moderate spinal canal stenosis. Bilateral neural foraminal narrowing (moderate right, moderate/severe left). L4-L5: Disc bulge asymmetric to the left. Facet arthrosis/ligamentum flavum hypertrophy. Suspected at least moderate spinal canal stenosis. Bilateral neural foraminal narrowing (moderate right, severe left). L5-S1: Disc bulge with endplate spurring. Facet arthrosis/ligamentum flavum hypertrophy. Left greater than right subarticular narrowing. No more than mild central canal stenosis. Severe bilateral neural foraminal narrowing. IMPRESSION: No evidence of acute fracture to the lumbar spine. Chronic L3 compression fracture, unchanged as compared to CT 11/15/2009. As before, there is mild bony  retropulsion at this level. Lumbar spondylosis as outlined. There is multifactorial suspected moderate spinal canal stenosis at L2-L3, and at least moderate spinal canal stenosis at L3-L4 and L4-L5. Multilevel neural foraminal narrowing greatest on the left at L4-L5 and bilaterally at L5-S1 (severe at these sites). Aortoiliac atherosclerosis. Right renal calculi versus renovascular calcifications. Electronically Signed   By: Kellie Simmering DO   On: 07/15/2019 12:09   DG Hip Unilat With Pelvis 2-3 Views Left  Result Date: 07/15/2019 CLINICAL DATA:  Left hip pain after fall. EXAM: DG HIP (WITH OR WITHOUT PELVIS) 2-3V LEFT COMPARISON:  None. FINDINGS: There is no evidence of hip fracture or dislocation. There is no evidence of arthropathy or other focal bone abnormality. IMPRESSION: Negative. Electronically Signed   By: Marijo Conception M.D.   On: 07/15/2019 11:26   DG Femur Min 2 Views Left  Result Date: 07/15/2019 CLINICAL DATA:  Left hip pain after fall. EXAM: LEFT FEMUR 2 VIEWS COMPARISON:  None. FINDINGS: There is no evidence of fracture or other focal bone lesions. Vascular calcifications are noted. IMPRESSION: Negative. Electronically Signed   By: Marijo Conception M.D.   On: 07/15/2019 10:39    Procedures .Marland KitchenLaceration Repair  Date/Time: 07/15/2019 1:07 PM Performed by: Eustaquio Maize, PA-C Authorized by: Eustaquio Maize, PA-C   Consent:    Consent obtained:  Verbal   Consent given by:  Patient   Risks discussed:  Infection, pain and poor cosmetic result Anesthesia (see MAR for exact dosages):    Anesthesia method:  Topical application   Topical anesthetic:  LET Laceration details:    Location:  Scalp   Scalp location:  Occipital   Length (cm):  2   Depth (mm):  2 Repair type:    Repair type:  Simple Pre-procedure details:    Preparation:  Patient was prepped and draped in usual sterile fashion Exploration:    Hemostasis achieved with:  LET   Wound exploration: wound explored through  full range of motion   Skin repair:    Repair method:  Staples   Number of staples:  4 Approximation:    Approximation:  Close Post-procedure details:    Dressing:  Open (no dressing)   Patient tolerance of procedure:  Tolerated well, no immediate complications   (including critical care time)  Medications Ordered in ED Medications  potassium chloride SA (KLOR-CON) CR tablet 40 mEq (has no administration in time range)  Tdap (BOOSTRIX) injection 0.5 mL (0.5 mLs Intramuscular Given 07/15/19 1052)  lidocaine-EPINEPHrine-tetracaine (LET) topical gel (3 mLs Topical Given 07/15/19 1224)  acetaminophen (TYLENOL) tablet 650 mg (650 mg Oral Given 07/15/19 1324)    ED Course  I have reviewed the triage vital signs and the nursing notes.  Pertinent labs & imaging results  that were available during my care of the patient were reviewed by me and considered in my medical decision making (see chart for details).    MDM Rules/Calculators/A&P                      86 year old female who presents to the ED today after a fall from [facility]. She has laceration to the occiput area of her head however difficult to assess given c-collar placement. She has midline lower back lumbar tenderness as well as ecchymosis noted to her left proximal femur with tenderness to palpation. Patient is currently alert to self and place. She is unable to tell me the year, month, who the president is or what the situation is today. Per facility this is not her baseline. Will work-up for altered mental status at this time. Will obtain CT head given obvious trauma to the head, patient is not anticoagulated however. She will need staple placement. Update tetanus. Will rule out infection at this time and continue to monitor. She does have history of cognitive ages noted on her past medical history with most recent MoCA score 29 out of 30.   Additional history was obtained by staff at facility. They report that  patient was "cyanotic" when she fell. States her lips were blue and her O2 sat was 84%. However on EMS arrival she was 96% on room air. She continues to sat 98% on room air with [place]. Given complaint of cyanotic will work-up for potential shortness of breath with EKG, troponin, chest x-[place].  CBC without leukocytosis. Hemoglobin stable. CMP with potassium 3.3. Will replete in the ED. Creatinine 1.03 however this is patient's baseline.  U/A without infection.  Chest x-[place] clear. Initial troponin of 8 and repeat of 9. Pt without complaints of chest pain.   CT Head, CT L spine, and CT C spine clear of acute findings. C collar removed.  4 staples placed to pt's occiput. Will need to have them removed in 1 week.   Remainder of xrays negative.  On reevaluation pt appears more alert. She is aware to situation and whom the president is. Suspect patient may have been dazed during initial fall however she is improving and I feel she is stable for discharge back to facility who can monitor patient. Do not feel she requires admission at this time. Pt to be picked up by her POA who will transport her back to facility.   This note was prepared using Dragon voice recognition software and may include unintentional dictation errors due to the inherent limitations of voice recognition software.  Final Clinical Impression(s) / ED Diagnoses Final diagnoses:  Injury of head, initial encounter  Fall, initial encounter  Laceration of scalp, initial encounter    Rx / DC Orders ED Discharge Orders    None       Discharge Instructions     Your labwork and imaging was reassuring today. No fractures anywhere.  You will need to have staples removed in 1 week. You can return to the ED or have them removed at your assisted living facility. Keep wound clean and dry until then. Your tetanus was updated in the ED.  Return to the ED for any worsening symptoms including severe headache, vision changes, weakness or  numbness on one side of your body, confusion       Eustaquio Maize, PA-C 07/15/19 1544    Davonna Belling, MD 07/15/19 [id number]

## 2019-07-15 NOTE — ED Notes (Signed)
Attemtped to call [facility] to get details about pt and transport. Called [facility]. Went to [facility]

## 2019-07-15 NOTE — ED Notes (Signed)
[facility] Pickerel arrived to pick pt up. Pt dressed and wheeled to care. This RN discussed d/c paperwork with Larry,POA about follow up care . Fritz Pickerel verbalized understanding and stated he would speak with the facility about removing staples.  Pt hearing aide (1) was given to [place].

## 2019-07-18 ENCOUNTER — Non-Acute Institutional Stay: Payer: Medicare Other | Admitting: Nurse Practitioner

## 2019-07-18 ENCOUNTER — Encounter: Payer: Self-pay | Admitting: Nurse Practitioner

## 2019-07-18 DIAGNOSIS — M109 Gout, unspecified: Secondary | ICD-10-CM

## 2019-07-18 DIAGNOSIS — E039 Hypothyroidism, unspecified: Secondary | ICD-10-CM | POA: Diagnosis not present

## 2019-07-18 DIAGNOSIS — R2681 Unsteadiness on feet: Secondary | ICD-10-CM | POA: Diagnosis not present

## 2019-07-18 DIAGNOSIS — S0101XD Laceration without foreign body of scalp, subsequent encounter: Secondary | ICD-10-CM

## 2019-07-18 DIAGNOSIS — S0101XA Laceration without foreign body of scalp, initial encounter: Secondary | ICD-10-CM | POA: Insufficient documentation

## 2019-07-18 NOTE — Progress Notes (Signed)
Location:   [place] Room Number: [id number] Place of Service:  [facility] (13) Provider: Lennie Odor Rissie Sculley NP  Virgie Dad, MD  Patient Care Team: Virgie Dad, MD as PCP - General (Internal Medicine) Aquiles Ruffini X, NP as Nurse Practitioner (Internal Medicine) Virgie Dad, MD as Consulting Physician (Internal Medicine)  Extended Emergency Contact Information Primary Emergency Contact: Detloff,[name]  Johnnette Litter of [place] Phone: [phone] Relation: Other  Code Status: DNR Goals of care: Advanced Directive information Advanced Directives 10/12/2018  Does Patient Have a Medical Advance Directive? Yes  Type of Advance Directive Out of facility DNR (pink MOST or yellow form)  Does patient want to make changes to medical advance directive? No - Patient declined  Pre-existing out of facility DNR order (yellow form or pink MOST form) Yellow form placed in chart (order not valid for inpatient use)     Chief Complaint  Patient presents with  . Acute Visit    S/P fall, ED, scalp contusion    HPI:  Pt is a 86 y.o. female seen today for an acute visit for s/p fall 07/15/19, s/p  ED eval, staple x4 closure of the left occiput scalp laceration. The patient denied pain, focal weakness, or generalized malaise today. 07/15/19 ED  Unremarkable CT head, C spine, L spine. CXR, X-Busler L hip, L femur, pelvis, EKG, CBC/diff, CmP, Mg, troponin I, UA C/S  Hx of HTN, blood pressure is controlled on Benazepril 10mg  qd. Gout stable, on prn Colchicine 0.6mg  bid, Allopurinol 100mg  qd. Hypothyroidism, stable, on Levothyroxine. The patient resides in [facility] for safety, care assistance, ambulate with walker.     Past Medical History:  Diagnosis Date  . Cognitive changes   . Depression   . Gout   . Hypertension   . Hypothyroidism   . Peripheral neuropathy    History reviewed. No pertinent surgical history.  Allergies  Allergen Reactions  . Actonel [Risedronate Sodium]   . Hct  [Hydrochlorothiazide]   . Lipitor [Atorvastatin Calcium]   . Neomycin Other (See Comments)    Unknown "been so long ago, a Dermatologist told me"  . Polysporin [Bacitracin-Polymyxin B]   . Prolia [Denosumab]   . Sulfa Antibiotics   . Zocor [Simvastatin]     Allergies as of 07/18/2019      Reactions   Actonel [risedronate Sodium]    Hct [hydrochlorothiazide]    Lipitor [atorvastatin Calcium]    Neomycin Other (See Comments)   Unknown "been so long ago, a Dermatologist told me"   Polysporin [bacitracin-polymyxin B]    Prolia [denosumab]    Sulfa Antibiotics    Zocor [simvastatin]       Medication List       Accurate as of Jul 18, 2019 11:59 PM. If you have any questions, ask your nurse or doctor.        STOP taking these medications   pregabalin 50 MG capsule Commonly known as: LYRICA Stopped by: Sumayah Bearse X Nieves Chapa, NP     TAKE these medications   allopurinol 100 MG tablet Commonly known as: ZYLOPRIM Take 100 mg by mouth daily.   benazepril 10 MG tablet Commonly known as: LOTENSIN Take 10 mg by mouth daily.   Calcium Carb-Cholecalciferol 500-400 MG-UNIT Tabs Take 2 tablets by mouth daily.   colchicine 0.6 MG tablet Take 0.6 mg by mouth 2 (two) times daily as needed (gout flares).   CoQ10 100 MG Caps Take 2 capsules by mouth daily.   levothyroxine 100  MCG tablet Commonly known as: SYNTHROID Take 100 mcg by mouth once a week. On Wednesday   levothyroxine 50 MCG tablet Commonly known as: SYNTHROID Take 50 mcg by mouth daily before breakfast. Sun,Mon,Tue, Thur,Fri,Sat   Omega-3 1000 MG Caps Take 2 capsules by mouth 2 (two) times daily.   Vitamin D3 50 MCG (2000 UT) capsule Take 2,000 Units by mouth daily.       Review of Systems  Constitutional: Negative for activity change, appetite change, fatigue and fever.  HENT: Positive for hearing loss. Negative for congestion and voice change.   Eyes: Negative for visual disturbance.  Respiratory: Negative for  cough, shortness of breath and wheezing.   Cardiovascular: Negative for chest pain, palpitations and leg swelling.  Gastrointestinal: Negative for abdominal distention, abdominal pain, constipation and vomiting.  Genitourinary: Negative for difficulty urinating, dysuria and urgency.  Musculoskeletal: Positive for gait problem.  Skin: Positive for wound. Negative for color change and pallor.  Neurological: Negative for speech difficulty, weakness, light-headedness and headaches.       Memory lapses.   Psychiatric/Behavioral: Negative for agitation, behavioral problems and sleep disturbance.    Immunization History  Administered Date(s) Administered  . Influenza, High Dose Seasonal PF 12/11/2018  . Influenza-Unspecified 03/24/2018  . Moderna SARS-COVID-2 Vaccination 03/15/2019, 04/21/2019  . Tdap 07/15/2019   Pertinent  Health Maintenance Due  Topic Date Due  . DEXA SCAN  Never done  . PNA vac Low Risk Adult (1 of 2 - PCV13) Never done  . INFLUENZA VACCINE  10/09/2019   No flowsheet data found. Functional Status Survey:    Vitals:   07/18/19 1537  BP: (!) 142/78  Pulse: 84  Resp: 20  Temp: (!) 97.3 F (36.3 C)  SpO2: 98%  Weight: 143 lb (64.9 kg)  Height: 5\' 1"  (1.549 m)   Body mass index is 27.02 kg/m. Physical Exam Vitals and nursing note reviewed.  Constitutional:      Appearance: Normal appearance.  HENT:     Head: Normocephalic and atraumatic.     Nose: Nose normal.     Mouth/Throat:     Mouth: Mucous membranes are moist.  Eyes:     Extraocular Movements: Extraocular movements intact.     Conjunctiva/sclera: Conjunctivae normal.     Pupils: Pupils are equal, round, and reactive to light.  Cardiovascular:     Rate and Rhythm: Normal rate and regular rhythm.     Heart sounds: No murmur.  Pulmonary:     Breath sounds: No wheezing, rhonchi or rales.  Abdominal:     General: Bowel sounds are normal. There is no distension.     Palpations: Abdomen is soft.      Tenderness: There is no abdominal tenderness.  Musculoskeletal:     Cervical back: Normal range of motion and neck supple.     Right lower leg: No edema.     Left lower leg: No edema.  Skin:    General: Skin is warm and dry.     Comments: Left occiput laceration is closed with 4x staples, no active bleeding or s/s of infection.   Neurological:     General: No focal deficit present.     Mental Status: She is alert and oriented to person, place, and time. Mental status is at baseline.     Cranial Nerves: No cranial nerve deficit.     Motor: No weakness.     Coordination: Coordination normal.     Gait: Gait abnormal.  Psychiatric:  Mood and Affect: Mood normal.        Behavior: Behavior normal.     Labs reviewed: Recent Labs    03/18/19 0000 07/15/19 1046  NA 142 141  K 4.0 3.3*  CL 107 109  CO2 24* 24  GLUCOSE  --  98  BUN 25* 18  CREATININE 1.1 1.03*  CALCIUM 9.1 8.7*  MG  --  1.9   Recent Labs    03/18/19 0000 07/15/19 1046  AST 17 19  ALT 11 15  ALKPHOS 73 61  BILITOT  --  0.5  PROT  --  6.0*  ALBUMIN 3.7 3.1*   Recent Labs    03/18/19 0000 07/15/19 1046  WBC 7.6 8.7  NEUTROABS 5,502 7.4  HGB 13.1 12.6  HCT 39 39.3  MCV  --  100.0  PLT 176 170   Lab Results  Component Value Date   TSH 2.41 03/18/2019   No results found for: HGBA1C No results found for: CHOL, HDL, LDLCALC, LDLDIRECT, TRIG, CHOLHDL  Significant Diagnostic Results in last 30 days:  DG Chest 2 View  Result Date: 07/15/2019 CLINICAL DATA:  Status post fall, confused EXAM: CHEST - 2 VIEW COMPARISON:  11/21/2009 FINDINGS: Hazy bilateral lower lobe airspace disease. No other focal consolidation, pleural effusion or pneumothorax. Stable cardiomediastinal silhouette. No aggressive osseous lesion. IMPRESSION: Hazy bilateral lower lobe airspace disease which may reflect atelectasis versus pneumonia. Electronically Signed   By: Kathreen Devoid   On: 07/15/2019 10:42   CT Head Wo  Contrast  Result Date: 07/15/2019 CLINICAL DATA:  Head trauma, minor. Neck trauma. Additional provided: Fall, positive loss of consciousness, laceration to back of head. EXAM: CT HEAD WITHOUT CONTRAST CT CERVICAL SPINE WITHOUT CONTRAST TECHNIQUE: Multidetector CT imaging of the head and cervical spine was performed following the standard protocol without intravenous contrast. Multiplanar CT image reconstructions of the cervical spine were also generated. COMPARISON:  No pertinent prior studies available for comparison. FINDINGS: CT HEAD FINDINGS Brain: There are small foci of chronic encephalomalacia within the posterior occipital lobes bilaterally (for instance as seen on series 3, image 10). Mild ill-defined hypoattenuation within the cerebral white matter is nonspecific, but consistent with chronic small vessel ischemic disease. Mild generalized parenchymal atrophy. There is no acute intracranial hemorrhage. No acute demarcated cortical infarct. No extra-axial fluid collection. No evidence of intracranial mass. No midline shift. Vascular: No hyperdense vessel.  Atherosclerotic calcifications. Skull: Normal. Negative for fracture or focal lesion. Sinuses/Orbits: Visualized orbits show no acute finding. Mild ethmoid sinus mucosal thickening. Small left mastoid effusion. Other: Small left occipital scalp laceration with associated subcutaneous gas. CT CERVICAL SPINE FINDINGS The examination is mildly motion degraded. Alignment: Rightward rotation of C1 relative to C2, which may be related to patient head positioning at the time of examination. Straightening of the expected cervical lordosis. There is mild grade 1 anterolisthesis at C2-C3, C3-C4 and C4-C5. Mild C5-C6 grade 1 retrolisthesis. Mild C7-T1 grade 1 anterolisthesis. Skull base and vertebrae: The basion-dental and atlanto-dental intervals are maintained.No evidence of acute fracture to the cervical spine. Soft tissues and spinal canal: No prevertebral fluid  or swelling. No visible canal hematoma. Disc levels: Cervical spondylosis with multilevel disc space narrowing, posterior disc osteophytes, uncovertebral and facet hypertrophy. Of note, there is severe disc degeneration at C5-C6 and C6-C7. Upper chest: No consolidation within the imaged lung apices. No visible pneumothorax IMPRESSION: CT head: 1. No evidence of acute intracranial abnormality. 2. Small left occipital scalp laceration. 3. Small foci of  chronic encephalomalacia within the posterior occipital lobes bilaterally. 4. Mild generalized parenchymal atrophy and chronic small vessel ischemic disease. 5. Mild ethmoid sinus mucosal thickening. 6. Small left mastoid effusion. CT cervical spine: 1. Mildly motion degraded examination. 2. No evidence of acute fracture to the cervical spine. 3. Rightward rotation of C1 relative to C2, which may be related to patient head positioning at the time of examination. 4. Mild multilevel spondylolisthesis . 5. Cervical spondylosis as described. Electronically Signed   By: Kellie Simmering DO   On: 07/15/2019 11:54   CT Cervical Spine Wo Contrast  Result Date: 07/15/2019 CLINICAL DATA:  Head trauma, minor. Neck trauma. Additional provided: Fall, positive loss of consciousness, laceration to back of head. EXAM: CT HEAD WITHOUT CONTRAST CT CERVICAL SPINE WITHOUT CONTRAST TECHNIQUE: Multidetector CT imaging of the head and cervical spine was performed following the standard protocol without intravenous contrast. Multiplanar CT image reconstructions of the cervical spine were also generated. COMPARISON:  No pertinent prior studies available for comparison. FINDINGS: CT HEAD FINDINGS Brain: There are small foci of chronic encephalomalacia within the posterior occipital lobes bilaterally (for instance as seen on series 3, image 10). Mild ill-defined hypoattenuation within the cerebral white matter is nonspecific, but consistent with chronic small vessel ischemic disease. Mild  generalized parenchymal atrophy. There is no acute intracranial hemorrhage. No acute demarcated cortical infarct. No extra-axial fluid collection. No evidence of intracranial mass. No midline shift. Vascular: No hyperdense vessel.  Atherosclerotic calcifications. Skull: Normal. Negative for fracture or focal lesion. Sinuses/Orbits: Visualized orbits show no acute finding. Mild ethmoid sinus mucosal thickening. Small left mastoid effusion. Other: Small left occipital scalp laceration with associated subcutaneous gas. CT CERVICAL SPINE FINDINGS The examination is mildly motion degraded. Alignment: Rightward rotation of C1 relative to C2, which may be related to patient head positioning at the time of examination. Straightening of the expected cervical lordosis. There is mild grade 1 anterolisthesis at C2-C3, C3-C4 and C4-C5. Mild C5-C6 grade 1 retrolisthesis. Mild C7-T1 grade 1 anterolisthesis. Skull base and vertebrae: The basion-dental and atlanto-dental intervals are maintained.No evidence of acute fracture to the cervical spine. Soft tissues and spinal canal: No prevertebral fluid or swelling. No visible canal hematoma. Disc levels: Cervical spondylosis with multilevel disc space narrowing, posterior disc osteophytes, uncovertebral and facet hypertrophy. Of note, there is severe disc degeneration at C5-C6 and C6-C7. Upper chest: No consolidation within the imaged lung apices. No visible pneumothorax IMPRESSION: CT head: 1. No evidence of acute intracranial abnormality. 2. Small left occipital scalp laceration. 3. Small foci of chronic encephalomalacia within the posterior occipital lobes bilaterally. 4. Mild generalized parenchymal atrophy and chronic small vessel ischemic disease. 5. Mild ethmoid sinus mucosal thickening. 6. Small left mastoid effusion. CT cervical spine: 1. Mildly motion degraded examination. 2. No evidence of acute fracture to the cervical spine. 3. Rightward rotation of C1 relative to C2,  which may be related to patient head positioning at the time of examination. 4. Mild multilevel spondylolisthesis . 5. Cervical spondylosis as described. Electronically Signed   By: Kellie Simmering DO   On: 07/15/2019 11:54   CT Lumbar Spine Wo Contrast  Result Date: 07/15/2019 CLINICAL DATA:  Low back pain, trauma.  Additional provided: Fall. EXAM: CT LUMBAR SPINE WITHOUT CONTRAST TECHNIQUE: Multidetector CT imaging of the lumbar spine was performed without intravenous contrast administration. Multiplanar CT image reconstructions were also generated. COMPARISON:  CT abdomen/pelvis 11/15/2009 FINDINGS: Segmentation: 5 lumbar vertebrae. Alignment: Mild lumbar levocurvature. Reversal of the expected lumbar lordosis  centered at L3. There is 3 mm bony retropulsion at the L3 level. Trace L1-L2 grade 1 anterolisthesis. Mild L4-L5 and L5-S1 grade 1 retrolisthesis. Vertebrae: Chronic L3 compression fracture with approximately 25% height loss and mild bony retropulsion. This is unchanged as compared to CT abdomen/pelvis 11/15/2009. Vertebral body height is otherwise maintained. No evidence of acute fracture to the lumbar spine. Diffuse osseous demineralization. Paraspinal and other soft tissues: Aortoiliac atherosclerosis. Right renal calculi versus renovascular calcifications paraspinal soft tissues within normal limits. Disc levels: Multilevel disc space narrowing and disc vacuum phenomenon. Most notably, there is moderate disc space narrowing at the L3-L4, L4-L5 and L5-S1 levels. L1-L2: Disc bulge. Facet arthrosis. No appreciable significant spinal canal stenosis. Mild bilateral neural foraminal narrowing. L2-L3: L3 bony retropulsion with disc bulge and facet arthrosis. Suspected moderate spinal canal stenosis. No significant foraminal narrowing. L3-L4: L3 bony retropulsion. Disc bulge. Facet arthrosis/ligamentum flavum hypertrophy. Suspected at least moderate spinal canal stenosis. Bilateral neural foraminal narrowing  (moderate right, moderate/severe left). L4-L5: Disc bulge asymmetric to the left. Facet arthrosis/ligamentum flavum hypertrophy. Suspected at least moderate spinal canal stenosis. Bilateral neural foraminal narrowing (moderate right, severe left). L5-S1: Disc bulge with endplate spurring. Facet arthrosis/ligamentum flavum hypertrophy. Left greater than right subarticular narrowing. No more than mild central canal stenosis. Severe bilateral neural foraminal narrowing. IMPRESSION: No evidence of acute fracture to the lumbar spine. Chronic L3 compression fracture, unchanged as compared to CT 11/15/2009. As before, there is mild bony retropulsion at this level. Lumbar spondylosis as outlined. There is multifactorial suspected moderate spinal canal stenosis at L2-L3, and at least moderate spinal canal stenosis at L3-L4 and L4-L5. Multilevel neural foraminal narrowing greatest on the left at L4-L5 and bilaterally at L5-S1 (severe at these sites). Aortoiliac atherosclerosis. Right renal calculi versus renovascular calcifications. Electronically Signed   By: Kellie Simmering DO   On: 07/15/2019 12:09   DG Hip Unilat With Pelvis 2-3 Views Left  Result Date: 07/15/2019 CLINICAL DATA:  Left hip pain after fall. EXAM: DG HIP (WITH OR WITHOUT PELVIS) 2-3V LEFT COMPARISON:  None. FINDINGS: There is no evidence of hip fracture or dislocation. There is no evidence of arthropathy or other focal bone abnormality. IMPRESSION: Negative. Electronically Signed   By: Marijo Conception M.D.   On: 07/15/2019 11:26   DG Femur Min 2 Views Left  Result Date: 07/15/2019 CLINICAL DATA:  Left hip pain after fall. EXAM: LEFT FEMUR 2 VIEWS COMPARISON:  None. FINDINGS: There is no evidence of fracture or other focal bone lesions. Vascular calcifications are noted. IMPRESSION: Negative. Electronically Signed   By: Marijo Conception M.D.   On: 07/15/2019 10:39    Assessment/Plan: Laceration of occipital region of scalp without  complication Sustained scalp laceration 07/15/19 ED staple x4 remove in one wk. Unremarkable CT head, C spine, L spine. CXR, X-[place]ginn[place] L hip, L femur, pelvis, EKG, CBC/diff, CmP, Mg, troponin I, UA C/S  Gout Stable, continue Allopurinol, prn Colchicine.   Hypothyroidism Stable, continue Levothyroxine. TSH 2.69 10/12/18, 2.41 03/18/19  Gait instability Continue ambulating with walker, hx of peripheral neuropathy is contributory.     Family/ staff Communication: plan of care reviewed with the patient and charge nurse.   Labs/tests ordered:  None  Time spend 40 minutes.

## 2019-07-18 NOTE — Assessment & Plan Note (Addendum)
Sustained scalp laceration 07/15/19 ED staple x4 remove in one wk. Unremarkable CT head, C spine, L spine. CXR, X-[place] L hip, L femur, pelvis, EKG, CBC/diff, CmP, Mg, troponin I, UA C/S

## 2019-07-19 ENCOUNTER — Encounter: Payer: Self-pay | Admitting: Nurse Practitioner

## 2019-07-19 ENCOUNTER — Telehealth: Payer: Self-pay | Admitting: *Deleted

## 2019-07-19 DIAGNOSIS — R2681 Unsteadiness on feet: Secondary | ICD-10-CM | POA: Insufficient documentation

## 2019-07-19 NOTE — Assessment & Plan Note (Signed)
Continue ambulating with walker, hx of peripheral neuropathy is contributory.

## 2019-07-19 NOTE — Assessment & Plan Note (Signed)
Stable, continue Allopurinol, prn Colchicine.  

## 2019-07-19 NOTE — Assessment & Plan Note (Addendum)
Stable, continue Levothyroxine. TSH 2.69 10/12/18, 2.41 03/18/19

## 2019-07-19 NOTE — Telephone Encounter (Signed)
Received Long Term Form from [employer] regarding Daily Living Functional Status.  Placed Form in Dr. Steve Rattler box to review, fill out and sign. To be faxed back to: [phone] once completed.  Case #: [id number]

## 2019-07-26 DIAGNOSIS — I10 Essential (primary) hypertension: Secondary | ICD-10-CM | POA: Diagnosis not present

## 2019-07-27 ENCOUNTER — Encounter: Payer: Self-pay | Admitting: Nurse Practitioner

## 2019-07-27 DIAGNOSIS — N183 Chronic kidney disease, stage 3 unspecified: Secondary | ICD-10-CM | POA: Insufficient documentation

## 2019-07-28 NOTE — Telephone Encounter (Signed)
I have filled the form and given it to you. Needs to be faxed to stated number

## 2019-08-01 ENCOUNTER — Telehealth: Payer: Self-pay

## 2019-08-04 NOTE — Telephone Encounter (Signed)
Dr. Mariea Clonts forwarded response to Harley Hallmark, [place]

## 2019-08-17 LAB — BASIC METABOLIC PANEL
BUN: 27 — AB (ref 4–21)
CO2: 23 — AB (ref 13–22)
Chloride: 107 (ref 99–108)
Creatinine: 1.1 (ref 0.5–1.1)
Glucose: 79
Potassium: 3.9 (ref 3.4–5.3)
Sodium: 142 (ref 137–147)

## 2019-08-17 LAB — COMPREHENSIVE METABOLIC PANEL: Calcium: 9.4 (ref 8.7–10.7)

## 2019-09-08 ENCOUNTER — Telehealth: Payer: Self-pay

## 2019-09-08 NOTE — Telephone Encounter (Addendum)
I reviewed media at the time of incoming call and re-reviewed as instructed by Virgie Dad, MD. Unfortunately the paperwork that Margreta Journey is referring to Case Number:  [id number] is not in [place]. There is a form in Epic scanned on 08/03/2019 and it is different from what has been sent over twice since June 17,2021.   I also received another incoming call from [facility] with [employer] to confirm that paperwork was received (as she faxed again), it was, and I placed it in Dr.Gupta's mailbox for completion.  Per Margreta Journey the paperwork is considered URGENT (since this is the third attempt to get paperwork completed) and needs to be done within the next 48 hours if possible and returned to them.   Paperwork placed in Dr.Gupta's in-office mailbox for review or for her medical assistant to take to her on a clinic day and further process by faxing and sending copy to scanning when complete.

## 2019-09-08 NOTE — Telephone Encounter (Signed)
Incoming call received from representative with [employer] checking the status of paperwork that was faxed x 2 since June 17.  I was unable to locate paperwork. I spoke with Mickey Farber (Dr.Gupta's medical assistant) and she had some paperwork from May 2021.   Representative will refax and mail to assure paperwork received.

## 2019-09-08 NOTE — Telephone Encounter (Signed)
I have already filled that Paper work. If you look in the Media she has copy of that. You can Refax them. .I do have new Forms with me.

## 2019-09-09 DIAGNOSIS — M109 Gout, unspecified: Secondary | ICD-10-CM | POA: Diagnosis not present

## 2019-09-09 DIAGNOSIS — I10 Essential (primary) hypertension: Secondary | ICD-10-CM | POA: Diagnosis not present

## 2019-09-09 DIAGNOSIS — E785 Hyperlipidemia, unspecified: Secondary | ICD-10-CM | POA: Diagnosis not present

## 2019-09-09 DIAGNOSIS — R29898 Other symptoms and signs involving the musculoskeletal system: Secondary | ICD-10-CM | POA: Diagnosis not present

## 2019-09-09 DIAGNOSIS — G609 Hereditary and idiopathic neuropathy, unspecified: Secondary | ICD-10-CM | POA: Diagnosis not present

## 2019-09-09 DIAGNOSIS — M810 Age-related osteoporosis without current pathological fracture: Secondary | ICD-10-CM | POA: Diagnosis not present

## 2019-09-09 DIAGNOSIS — M6281 Muscle weakness (generalized): Secondary | ICD-10-CM | POA: Diagnosis not present

## 2019-09-09 DIAGNOSIS — R2991 Unspecified symptoms and signs involving the musculoskeletal system: Secondary | ICD-10-CM | POA: Diagnosis not present

## 2019-09-09 DIAGNOSIS — R2681 Unsteadiness on feet: Secondary | ICD-10-CM | POA: Diagnosis not present

## 2019-09-13 ENCOUNTER — Non-Acute Institutional Stay: Payer: Medicare Other | Admitting: Internal Medicine

## 2019-09-13 DIAGNOSIS — E039 Hypothyroidism, unspecified: Secondary | ICD-10-CM

## 2019-09-13 DIAGNOSIS — M109 Gout, unspecified: Secondary | ICD-10-CM | POA: Diagnosis not present

## 2019-09-13 DIAGNOSIS — R4189 Other symptoms and signs involving cognitive functions and awareness: Secondary | ICD-10-CM | POA: Diagnosis not present

## 2019-09-13 DIAGNOSIS — R2991 Unspecified symptoms and signs involving the musculoskeletal system: Secondary | ICD-10-CM | POA: Diagnosis not present

## 2019-09-13 DIAGNOSIS — I10 Essential (primary) hypertension: Secondary | ICD-10-CM | POA: Diagnosis not present

## 2019-09-13 DIAGNOSIS — R29898 Other symptoms and signs involving the musculoskeletal system: Secondary | ICD-10-CM | POA: Diagnosis not present

## 2019-09-13 DIAGNOSIS — R2681 Unsteadiness on feet: Secondary | ICD-10-CM

## 2019-09-13 DIAGNOSIS — G609 Hereditary and idiopathic neuropathy, unspecified: Secondary | ICD-10-CM | POA: Diagnosis not present

## 2019-09-13 DIAGNOSIS — M810 Age-related osteoporosis without current pathological fracture: Secondary | ICD-10-CM | POA: Diagnosis not present

## 2019-09-13 DIAGNOSIS — M6281 Muscle weakness (generalized): Secondary | ICD-10-CM | POA: Diagnosis not present

## 2019-09-13 NOTE — Progress Notes (Signed)
Location:   [place] Room Number: [place] of Service:  ALF [phone]) Provider:  Veleta Miners MD   Virgie Dad, MD  Patient Care Team: Virgie Dad, MD as PCP - General (Internal Medicine) Mast, Man X, NP as Nurse Practitioner (Internal Medicine) Virgie Dad, MD as Consulting Physician (Internal Medicine)  Extended Emergency Contact Information Primary Emergency Contact: Bascom,[name]  [place] Phone: [phone] Relation: Other  Code Status:  DNR Goals of care: Advanced Directive information Advanced Directives 10/12/2018  Does Patient Have a Medical Advance Directive? Yes  Type of Advance Directive Out of facility DNR (pink MOST or yellow form)  Does patient want to make changes to medical advance directive? No - Patient declined  Pre-existing out of facility DNR order (yellow form or pink MOST form) Yellow form placed in chart (order not valid for inpatient use)     Chief Complaint  Patient presents with  . Acute Visit    HPI:  Pt is a 86 y.o. female seen today for  Acute Visit for her Eval for Dementia eval for her Long term Insurance  Patient has a history of hypertension,gout, hypothyroidism and peripheral neuropathy? Vit D Def and Osteoporosis  Patient moved from [place] in 10/20.  She fell at home and this time was unable to take care of her Patient also had mild cognitive changes. Since [place] has [place] to [place] patient has done well for herself. She has had 1 fall requiring ED visit due to laceration But otherwise she has stayed independent for her ADLs Has not had any behavior issues.  Nurses do not report any other issues Patient walks with her walker. I saw patient today in her room she did not have any acute complaints. She answers my questions appropriately.  She lost her son few years ago and got upset when I talked about him Past Medical History:  Diagnosis Date  . Cognitive changes   . Depression   .  Gout   . Hypertension   . Hypothyroidism   . Peripheral neuropathy    No past surgical history on file.  Allergies  Allergen Reactions  . Actonel [Risedronate Sodium]   . Hct [Hydrochlorothiazide]   . Lipitor [Atorvastatin Calcium]   . Neomycin Other (See Comments)    Unknown "been so long ago, a Dermatologist told me"  . Polysporin [Bacitracin-Polymyxin B]   . Prolia [Denosumab]   . Sulfa Antibiotics   . Zocor [Simvastatin]     Allergies as of 09/13/2019      Reactions   Actonel [risedronate Sodium]    Hct [hydrochlorothiazide]    Lipitor [atorvastatin Calcium]    Neomycin Other (See Comments)   Unknown "been so long ago, a Dermatologist told me"   Polysporin [bacitracin-polymyxin B]    Prolia [denosumab]    Sulfa Antibiotics    Zocor [simvastatin]       Medication List       Accurate as of September 13, 2019 11:59 PM. If you have any questions, ask your nurse or doctor.        allopurinol 100 MG tablet Commonly known as: ZYLOPRIM Take 100 mg by mouth daily.   benazepril 10 MG tablet Commonly known as: LOTENSIN Take 10 mg by mouth daily.   Calcium Carb-Cholecalciferol 500-400 MG-UNIT Tabs Take 2 tablets by mouth daily.   colchicine 0.6 MG tablet Take 0.6 mg by mouth 2 (two) times daily as needed (gout flares).   CoQ10  100 MG Caps Take 2 capsules by mouth daily.   levothyroxine 100 MCG tablet Commonly known as: SYNTHROID Take 100 mcg by mouth once a week. On Wednesday   levothyroxine 50 MCG tablet Commonly known as: SYNTHROID Take 50 mcg by mouth daily before breakfast. Sun,Mon,Tue, Thur,Fri,Sat   Omega-3 1000 MG Caps Take 2 capsules by mouth 2 (two) times daily.   Vitamin D3 50 MCG (2000 UT) capsule Take 2,000 Units by mouth daily.       Review of Systems  Review of Systems  Constitutional: Negative for activity change, appetite change, chills, diaphoresis, fatigue and fever.  HENT: Negative for mouth sores, postnasal drip, rhinorrhea, sinus pain  and sore throat.   Respiratory: Negative for apnea, cough, chest tightness, shortness of breath and wheezing.   Cardiovascular: Negative for chest pain, palpitations and leg swelling.  Gastrointestinal: Negative for abdominal distention, abdominal pain, constipation, diarrhea, nausea and vomiting.  Genitourinary: Negative for dysuria and frequency.  Musculoskeletal: Negative for arthralgias, joint swelling and myalgias.  Skin: Negative for rash.  Neurological: Negative for dizziness, syncope, weakness, light-headedness and numbness.  Psychiatric/Behavioral: Negative for behavioral problems, confusion and sleep disturbance.     Immunization History  Administered Date(s) Administered  . Influenza, High Dose Seasonal PF 12/11/2018  . Influenza-Unspecified 03/24/2018  . Moderna SARS-COVID-2 Vaccination 03/15/2019, 04/21/2019  . Tdap 07/15/2019   Pertinent  Health Maintenance Due  Topic Date Due  . DEXA SCAN  Never done  . PNA vac Low Risk Adult (1 of 2 - PCV13) Never done  . INFLUENZA VACCINE  10/09/2019   No flowsheet data found. Functional Status Survey:    Vitals:   09/13/19 1014  BP: 130/72  Pulse: 64  Resp: (!) 24  Temp: (!) 97.5 F (36.4 C)  SpO2: 95%  Weight: 140 lb (63.5 kg)  Height: 5\' 1"  (1.549 m)   Body mass index is 26.45 kg/m. Physical Exam Constitutional: . Well-developed and well-nourished.  HENT:  Head: Normocephalic.  Mouth/Throat: Oropharynx is clear and moist.  Eyes: Pupils are equal, round, and reactive to light.  Neck: Neck supple.  Cardiovascular: Normal rate and normal heart sounds.  No murmur heard. Pulmonary/Chest: Effort normal and breath sounds normal. No respiratory distress. No wheezes. She has no rales.  Abdominal: Soft. Bowel sounds are normal. No distension. There is no tenderness. There is no rebound.  Musculoskeletal: No edema.  Lymphadenopathy: none Neurological: Gait unstable without Walker No Focal Deficits Skin: Skin is warm  and dry.  Psychiatric: Normal mood and affect. Behavior is normal. Thought content normal.   Labs reviewed: Recent Labs    03/18/19 0000 07/15/19 1046 08/17/19 0000  NA 142 141 142  K 4.0 3.3* 3.9  CL 107 109 107  CO2 24* 24 23*  GLUCOSE  --  98  --   BUN 25* 18 27*  CREATININE 1.1 1.03* 1.1  CALCIUM 9.1 8.7* 9.4  MG  --  1.9  --    Recent Labs    03/18/19 0000 07/15/19 1046  AST 17 19  ALT 11 15  ALKPHOS 73 61  BILITOT  --  0.5  PROT  --  6.0*  ALBUMIN 3.7 3.1*   Recent Labs    03/18/19 0000 07/15/19 1046  WBC 7.6 8.7  NEUTROABS 5,502 7.4  HGB 13.1 12.6  HCT 39 39.3  MCV  --  100.0  PLT 176 170   Lab Results  Component Value Date   TSH 2.41 03/18/2019   No results  found for: HGBA1C No results found for: CHOL, HDL, LDLCALC, LDLDIRECT, TRIG, CHOLHDL  Significant Diagnostic Results in last 30 days:  No results found.  Assessment/Plan Cognitive impairment MMSE was 29 out of 30 in 10/20 CT scan done in ED shows generalized atrophy Patient does have mild dementia.  Most likely Alzheimer's as she does not have any mild vascular risk. She would continue to need AL level of care for now.  Gait instability Is doing fine with her walker Gout,  Continue on allopurinol Essential hypertension Continue on lisinopril Hypothyroidism, unspecified type TSH normal in 1/21   Family/ staff Communication:   Labs/tests ordered:

## 2019-09-14 DIAGNOSIS — R2991 Unspecified symptoms and signs involving the musculoskeletal system: Secondary | ICD-10-CM | POA: Diagnosis not present

## 2019-09-14 DIAGNOSIS — G609 Hereditary and idiopathic neuropathy, unspecified: Secondary | ICD-10-CM | POA: Diagnosis not present

## 2019-09-14 DIAGNOSIS — R29898 Other symptoms and signs involving the musculoskeletal system: Secondary | ICD-10-CM | POA: Diagnosis not present

## 2019-09-14 DIAGNOSIS — M6281 Muscle weakness (generalized): Secondary | ICD-10-CM | POA: Diagnosis not present

## 2019-09-14 DIAGNOSIS — M109 Gout, unspecified: Secondary | ICD-10-CM | POA: Diagnosis not present

## 2019-09-14 DIAGNOSIS — M810 Age-related osteoporosis without current pathological fracture: Secondary | ICD-10-CM | POA: Diagnosis not present

## 2019-09-15 ENCOUNTER — Encounter: Payer: Self-pay | Admitting: Nurse Practitioner

## 2019-09-15 ENCOUNTER — Non-Acute Institutional Stay: Payer: Medicare Other | Admitting: Nurse Practitioner

## 2019-09-15 DIAGNOSIS — M109 Gout, unspecified: Secondary | ICD-10-CM | POA: Diagnosis not present

## 2019-09-15 DIAGNOSIS — S0101XD Laceration without foreign body of scalp, subsequent encounter: Secondary | ICD-10-CM

## 2019-09-15 DIAGNOSIS — E039 Hypothyroidism, unspecified: Secondary | ICD-10-CM | POA: Diagnosis not present

## 2019-09-15 DIAGNOSIS — N1831 Chronic kidney disease, stage 3a: Secondary | ICD-10-CM | POA: Diagnosis not present

## 2019-09-15 DIAGNOSIS — I10 Essential (primary) hypertension: Secondary | ICD-10-CM | POA: Diagnosis not present

## 2019-09-15 NOTE — Progress Notes (Signed)
Location:   [place] Room Number: [place] of Service:  ALF [phone]) Provider:  Marda Stalker, Lennie Odor NP   Virgie Dad, MD  Patient Care Team: Virgie Dad, MD as PCP - General (Internal Medicine) Abrham Maslowski X, NP as Nurse Practitioner (Internal Medicine) Virgie Dad, MD as Consulting Physician (Internal Medicine)  Extended Emergency Contact Information Primary Emergency Contact: Quint,[name]  [place] Phone: [phone] Relation: Other  Code Status:  DNR Goals of care: Advanced Directive information Advanced Directives 10/12/2018  Does Patient Have a Medical Advance Directive? Yes  Type of Advance Directive Out of facility DNR (pink MOST or yellow form)  Does patient want to make changes to medical advance directive? No - Patient declined  Pre-existing out of facility DNR order (yellow form or pink MOST form) Yellow form placed in chart (order not valid for inpatient use)     Chief Complaint  Patient presents with  . Medical Management of Chronic Issues    HPI:  Pt is a 86 y.o. female seen today for medical management of chronic diseases.    Hx of HTN, blood pressure is controlled on Benazepril 76m qd.   Gout stable, on prn Colchicine 0.653mbid, Allopurinol 10039md.  Hypothyroidism, stable, on Levothyroxine.   [facility] assistance, ambulate with walker.      Past Medical History:  Diagnosis Date  . Cognitive changes   . Depression   . Gout   . Hypertension   . Hypothyroidism   . Peripheral neuropathy    History reviewed. No pertinent surgical history.  Allergies  Allergen Reactions  . Actonel [Risedronate Sodium]   . Hct [Hydrochlorothiazide]   . Lipitor [Atorvastatin Calcium]   . Neomycin Other (See Comments)    Unknown "been so long ago, a Dermatologist told me"  . Polysporin [Bacitracin-Polymyxin B]   . Prolia [Denosumab]   . Sulfa Antibiotics   . Zocor [Simvastatin]       Allergies as of 09/15/2019      Reactions   Actonel [risedronate Sodium]    Hct [hydrochlorothiazide]    Lipitor [atorvastatin Calcium]    Neomycin Other (See Comments)   Unknown "been so long ago, a Dermatologist told me"   Polysporin [bacitracin-polymyxin B]    Prolia [denosumab]    Sulfa Antibiotics    Zocor [simvastatin]       Medication List       Accurate as of September 15, 2019 11:59 PM. If you have any questions, ask your nurse or doctor.        allopurinol 100 MG tablet Commonly known as: ZYLOPRIM Take 100 mg by mouth daily.   benazepril 10 MG tablet Commonly known as: LOTENSIN Take 10 mg by mouth daily.   Calcium Carb-Cholecalciferol 500-400 MG-UNIT Tabs Take 2 tablets by mouth daily.   colchicine 0.6 MG tablet Take 0.6 mg by mouth 2 (two) times daily as needed (gout flares).   CoQ10 100 MG Caps Take 2 capsules by mouth daily.   levothyroxine 100 MCG tablet Commonly known as: SYNTHROID Take 100 mcg by mouth once a week. On Wednesday   levothyroxine 50 MCG tablet Commonly known as: SYNTHROID Take 50 mcg by mouth daily before breakfast. Sun,Mon,Tue, Thur,Fri,Sat   Omega-3 1000 MG Caps Take 2 capsules by mouth 2 (two) times daily.   Vitamin D3 50 MCG (2000 UT) capsule Take 2,000 Units by mouth daily.  Review of Systems  Constitutional: Negative for activity change, fever and unexpected weight change.  HENT: Positive for hearing loss. Negative for congestion and voice change.   Eyes: Negative for visual disturbance.  Respiratory: Negative for cough and shortness of breath.   Cardiovascular: Negative for leg swelling.  Gastrointestinal: Negative for abdominal pain and constipation.  Genitourinary: Negative for difficulty urinating, dysuria and urgency.  Musculoskeletal: Positive for gait problem.  Skin: Negative for color change.  Neurological: Negative for speech difficulty, weakness and light-headedness.       Memory lapses.    Psychiatric/Behavioral: Negative for behavioral problems and sleep disturbance. The patient is not nervous/anxious.     Immunization History  Administered Date(s) Administered  . Influenza, High Dose Seasonal PF 12/11/2018  . Influenza-Unspecified 03/24/2018  . Moderna SARS-COVID-2 Vaccination 03/15/2019, 04/21/2019  . Tdap 07/15/2019   Pertinent  Health Maintenance Due  Topic Date Due  . DEXA SCAN  Never done  . PNA vac Low Risk Adult (1 of 2 - PCV13) Never done  . INFLUENZA VACCINE  10/09/2019   No flowsheet data found. Functional Status Survey:    Vitals:   09/15/19 1028  BP: 130/60  Pulse: 72  Resp: 20  Temp: 98 F (36.7 C)  SpO2: 95%  Weight: 140 lb (63.5 kg)  Height: '5\' 1"'  (1.549 m)   Body mass index is 26.45 kg/m. Physical Exam Vitals and nursing note reviewed.  Constitutional:      Appearance: Normal appearance.  HENT:     Head: Normocephalic and atraumatic.     Mouth/Throat:     Mouth: Mucous membranes are moist.  Eyes:     Extraocular Movements: Extraocular movements intact.     Conjunctiva/sclera: Conjunctivae normal.     Pupils: Pupils are equal, round, and reactive to light.  Cardiovascular:     Rate and Rhythm: Normal rate and regular rhythm.     Heart sounds: No murmur heard.   Pulmonary:     Breath sounds: No wheezing or rales.  Abdominal:     General: Bowel sounds are normal.     Palpations: Abdomen is soft.     Tenderness: There is no abdominal tenderness.  Musculoskeletal:     Cervical back: Normal range of motion and neck supple.     Right lower leg: No edema.     Left lower leg: No edema.  Skin:    General: Skin is warm and dry.  Neurological:     General: No focal deficit present.     Mental Status: She is alert and oriented to person, place, and time. Mental status is at baseline.     Gait: Gait abnormal.  Psychiatric:        Mood and Affect: Mood normal.        Behavior: Behavior normal.     Labs reviewed: Recent Labs     03/18/19 0000 07/15/19 1046 08/17/19 0000  NA 142 141 142  K 4.0 3.3* 3.9  CL 107 109 107  CO2 24* 24 23*  GLUCOSE  --  98  --   BUN 25* 18 27*  CREATININE 1.1 1.03* 1.1  CALCIUM 9.1 8.7* 9.4  MG  --  1.9  --    Recent Labs    03/18/19 0000 07/15/19 1046  AST 17 19  ALT 11 15  ALKPHOS 73 61  BILITOT  --  0.5  PROT  --  6.0*  ALBUMIN 3.7 3.1*   Recent Labs    03/18/19 0000  07/15/19 1046  WBC 7.6 8.7  NEUTROABS 5,502 7.4  HGB 13.1 12.6  HCT 39 39.3  MCV  --  100.0  PLT 176 170   Lab Results  Component Value Date   TSH 2.41 03/18/2019   No results found for: HGBA1C No results found for: CHOL, HDL, LDLCALC, LDLDIRECT, TRIG, CHOLHDL  Significant Diagnostic Results in last 30 days:  No results found.  Assessment/Plan HTN (hypertension) Blood pressure is controlled, continue Benazepril 38m qd, Bun/creat 22/1.05, eGFR 46 07/27/19  Hypothyroidism Stable, TSH 2.41 03/18/19, continue Levothyroxine.   CKD (chronic kidney disease) stage 3, GFR 30-59 ml/min Bun/creat 27/1.07 08/16/19  Gout Stable, continue Allopurinol, prn Colchicine.   Laceration of occipital region of scalp without complication Healed, no residual injury.      Family/ staff Communication:plan of care reviewed with the patient and charge nurse.   Labs/tests ordered:  none  Time spend 40 minutes.

## 2019-09-16 ENCOUNTER — Encounter: Payer: Self-pay | Admitting: Nurse Practitioner

## 2019-09-16 DIAGNOSIS — M6281 Muscle weakness (generalized): Secondary | ICD-10-CM | POA: Diagnosis not present

## 2019-09-16 DIAGNOSIS — R29898 Other symptoms and signs involving the musculoskeletal system: Secondary | ICD-10-CM | POA: Diagnosis not present

## 2019-09-16 DIAGNOSIS — M810 Age-related osteoporosis without current pathological fracture: Secondary | ICD-10-CM | POA: Diagnosis not present

## 2019-09-16 DIAGNOSIS — G609 Hereditary and idiopathic neuropathy, unspecified: Secondary | ICD-10-CM | POA: Diagnosis not present

## 2019-09-16 DIAGNOSIS — R2991 Unspecified symptoms and signs involving the musculoskeletal system: Secondary | ICD-10-CM | POA: Diagnosis not present

## 2019-09-16 DIAGNOSIS — M109 Gout, unspecified: Secondary | ICD-10-CM | POA: Diagnosis not present

## 2019-09-16 NOTE — Assessment & Plan Note (Signed)
Bun/creat 27/1.07 08/16/19

## 2019-09-16 NOTE — Assessment & Plan Note (Signed)
Blood pressure is controlled, continue Benazepril 44m qd, Bun/creat 22/1.05, eGFR 46 07/27/19

## 2019-09-16 NOTE — Assessment & Plan Note (Signed)
Healed, no residual injury.

## 2019-09-16 NOTE — Assessment & Plan Note (Signed)
Stable, continue Allopurinol, prn Colchicine.

## 2019-09-16 NOTE — Assessment & Plan Note (Signed)
Stable, TSH 2.41 03/18/19, continue Levothyroxine.

## 2019-09-19 DIAGNOSIS — M810 Age-related osteoporosis without current pathological fracture: Secondary | ICD-10-CM | POA: Diagnosis not present

## 2019-09-19 DIAGNOSIS — M109 Gout, unspecified: Secondary | ICD-10-CM | POA: Diagnosis not present

## 2019-09-19 DIAGNOSIS — M6281 Muscle weakness (generalized): Secondary | ICD-10-CM | POA: Diagnosis not present

## 2019-09-19 DIAGNOSIS — R2991 Unspecified symptoms and signs involving the musculoskeletal system: Secondary | ICD-10-CM | POA: Diagnosis not present

## 2019-09-19 DIAGNOSIS — G609 Hereditary and idiopathic neuropathy, unspecified: Secondary | ICD-10-CM | POA: Diagnosis not present

## 2019-09-19 DIAGNOSIS — R29898 Other symptoms and signs involving the musculoskeletal system: Secondary | ICD-10-CM | POA: Diagnosis not present

## 2019-09-21 DIAGNOSIS — M6281 Muscle weakness (generalized): Secondary | ICD-10-CM | POA: Diagnosis not present

## 2019-09-21 DIAGNOSIS — R29898 Other symptoms and signs involving the musculoskeletal system: Secondary | ICD-10-CM | POA: Diagnosis not present

## 2019-09-21 DIAGNOSIS — R2991 Unspecified symptoms and signs involving the musculoskeletal system: Secondary | ICD-10-CM | POA: Diagnosis not present

## 2019-09-21 DIAGNOSIS — M810 Age-related osteoporosis without current pathological fracture: Secondary | ICD-10-CM | POA: Diagnosis not present

## 2019-09-21 DIAGNOSIS — M109 Gout, unspecified: Secondary | ICD-10-CM | POA: Diagnosis not present

## 2019-09-21 DIAGNOSIS — G609 Hereditary and idiopathic neuropathy, unspecified: Secondary | ICD-10-CM | POA: Diagnosis not present

## 2019-09-21 NOTE — Telephone Encounter (Signed)
I have filled the Form and You can Fax them tomorrow . Copy needs to be scanned

## 2019-09-22 DIAGNOSIS — R2991 Unspecified symptoms and signs involving the musculoskeletal system: Secondary | ICD-10-CM | POA: Diagnosis not present

## 2019-09-22 DIAGNOSIS — M810 Age-related osteoporosis without current pathological fracture: Secondary | ICD-10-CM | POA: Diagnosis not present

## 2019-09-22 DIAGNOSIS — G609 Hereditary and idiopathic neuropathy, unspecified: Secondary | ICD-10-CM | POA: Diagnosis not present

## 2019-09-22 DIAGNOSIS — M109 Gout, unspecified: Secondary | ICD-10-CM | POA: Diagnosis not present

## 2019-09-22 DIAGNOSIS — M6281 Muscle weakness (generalized): Secondary | ICD-10-CM | POA: Diagnosis not present

## 2019-09-22 DIAGNOSIS — R29898 Other symptoms and signs involving the musculoskeletal system: Secondary | ICD-10-CM | POA: Diagnosis not present

## 2019-09-27 ENCOUNTER — Telehealth: Payer: Self-pay | Admitting: Internal Medicine

## 2019-09-27 DIAGNOSIS — M109 Gout, unspecified: Secondary | ICD-10-CM | POA: Diagnosis not present

## 2019-09-27 DIAGNOSIS — M6281 Muscle weakness (generalized): Secondary | ICD-10-CM | POA: Diagnosis not present

## 2019-09-27 DIAGNOSIS — R2991 Unspecified symptoms and signs involving the musculoskeletal system: Secondary | ICD-10-CM | POA: Diagnosis not present

## 2019-09-27 DIAGNOSIS — G609 Hereditary and idiopathic neuropathy, unspecified: Secondary | ICD-10-CM | POA: Diagnosis not present

## 2019-09-27 DIAGNOSIS — R29898 Other symptoms and signs involving the musculoskeletal system: Secondary | ICD-10-CM | POA: Diagnosis not present

## 2019-09-27 DIAGNOSIS — M810 Age-related osteoporosis without current pathological fracture: Secondary | ICD-10-CM | POA: Diagnosis not present

## 2019-09-27 NOTE — Telephone Encounter (Signed)
Raquel Sarna with [employer] called regarding a form that Dr. Lyndel Safe completed for patient.  She states that there is one question that needs an answer and that is if patient has a cognitive impairment.  The form is her is the office and I will give it to [place].  Raquel Sarna can be reached at [phone].  Thank you  Adan Sis

## 2019-09-27 NOTE — Telephone Encounter (Signed)
Yes is already marked. It wants a formal diagnoses. To Dr. Lyndel Safe.

## 2019-09-27 NOTE — Telephone Encounter (Signed)
Dr. Lyndel Safe is off today. Called Raquel Sarna to see how soon she needed the info but no answer. Left  message on machine.

## 2019-09-27 NOTE — Telephone Encounter (Signed)
[name] can you say yes for that Question on that form. Thanks

## 2019-09-27 NOTE — Telephone Encounter (Signed)
Confirmed with Dr. Lyndel Safe formal diagnoses is Cognitive impairment. I left another message for Raquel Sarna and will fax on tomorrow form tomorrow one I return to the office.

## 2019-09-28 DIAGNOSIS — G609 Hereditary and idiopathic neuropathy, unspecified: Secondary | ICD-10-CM | POA: Diagnosis not present

## 2019-09-28 DIAGNOSIS — M810 Age-related osteoporosis without current pathological fracture: Secondary | ICD-10-CM | POA: Diagnosis not present

## 2019-09-28 DIAGNOSIS — R29898 Other symptoms and signs involving the musculoskeletal system: Secondary | ICD-10-CM | POA: Diagnosis not present

## 2019-09-28 DIAGNOSIS — M6281 Muscle weakness (generalized): Secondary | ICD-10-CM | POA: Diagnosis not present

## 2019-09-28 DIAGNOSIS — R2991 Unspecified symptoms and signs involving the musculoskeletal system: Secondary | ICD-10-CM | POA: Diagnosis not present

## 2019-09-28 DIAGNOSIS — M109 Gout, unspecified: Secondary | ICD-10-CM | POA: Diagnosis not present

## 2019-09-29 ENCOUNTER — Telehealth: Payer: Self-pay

## 2019-09-29 NOTE — Telephone Encounter (Signed)
[name] with [employer] called stating a form was submitted on 09/26/2019 and the question at the top of page 6 of 11.   Per [name] they can take a verbal.  The question states: Does your patient have a severe cognitive impairment based on the definition below:  A severe cognitive impairment is a deterioration or loss in intellectual capacity (such as Alzheimer's disease) that places you in jeopardy of harming yourself or others, and therefore you require substantial supervision by another person; it is measured by clinician evidence and standardized test that reliable measure impairment of the individual's short or long term memory; orientation to people, place, or time; and deductive or abstract reasoning. Substantial supervision is the continual monitoring by another person (which may include cueing by verbal prompting, gesturing, or other demonstrations) to protect an individual from threats to his or her health and safety, for instance, while wandering.   Per [name] the provider needs to give a verbal yes or no.  [name] is aware Dr.Gupta is out of office and I will send to her covering nurse practitioner

## 2019-09-29 NOTE — Telephone Encounter (Signed)
Yes to your question per Dr. Tillman Abide note 09/13/19

## 2019-09-30 DIAGNOSIS — M109 Gout, unspecified: Secondary | ICD-10-CM | POA: Diagnosis not present

## 2019-09-30 DIAGNOSIS — R2991 Unspecified symptoms and signs involving the musculoskeletal system: Secondary | ICD-10-CM | POA: Diagnosis not present

## 2019-09-30 DIAGNOSIS — R29898 Other symptoms and signs involving the musculoskeletal system: Secondary | ICD-10-CM | POA: Diagnosis not present

## 2019-09-30 DIAGNOSIS — M810 Age-related osteoporosis without current pathological fracture: Secondary | ICD-10-CM | POA: Diagnosis not present

## 2019-09-30 DIAGNOSIS — M6281 Muscle weakness (generalized): Secondary | ICD-10-CM | POA: Diagnosis not present

## 2019-09-30 DIAGNOSIS — G609 Hereditary and idiopathic neuropathy, unspecified: Secondary | ICD-10-CM | POA: Diagnosis not present

## 2019-09-30 NOTE — Telephone Encounter (Signed)
Left detailed message for Raquel Sarna with [facility] response

## 2019-10-04 DIAGNOSIS — M810 Age-related osteoporosis without current pathological fracture: Secondary | ICD-10-CM | POA: Diagnosis not present

## 2019-10-04 DIAGNOSIS — G609 Hereditary and idiopathic neuropathy, unspecified: Secondary | ICD-10-CM | POA: Diagnosis not present

## 2019-10-04 DIAGNOSIS — M109 Gout, unspecified: Secondary | ICD-10-CM | POA: Diagnosis not present

## 2019-10-04 DIAGNOSIS — R2991 Unspecified symptoms and signs involving the musculoskeletal system: Secondary | ICD-10-CM | POA: Diagnosis not present

## 2019-10-04 DIAGNOSIS — R29898 Other symptoms and signs involving the musculoskeletal system: Secondary | ICD-10-CM | POA: Diagnosis not present

## 2019-10-04 DIAGNOSIS — M6281 Muscle weakness (generalized): Secondary | ICD-10-CM | POA: Diagnosis not present

## 2019-10-06 ENCOUNTER — Telehealth: Payer: Self-pay

## 2019-10-06 DIAGNOSIS — R2991 Unspecified symptoms and signs involving the musculoskeletal system: Secondary | ICD-10-CM | POA: Diagnosis not present

## 2019-10-06 DIAGNOSIS — M109 Gout, unspecified: Secondary | ICD-10-CM | POA: Diagnosis not present

## 2019-10-06 DIAGNOSIS — M810 Age-related osteoporosis without current pathological fracture: Secondary | ICD-10-CM | POA: Diagnosis not present

## 2019-10-06 DIAGNOSIS — G609 Hereditary and idiopathic neuropathy, unspecified: Secondary | ICD-10-CM | POA: Diagnosis not present

## 2019-10-06 DIAGNOSIS — M6281 Muscle weakness (generalized): Secondary | ICD-10-CM | POA: Diagnosis not present

## 2019-10-06 DIAGNOSIS — R29898 Other symptoms and signs involving the musculoskeletal system: Secondary | ICD-10-CM | POA: Diagnosis not present

## 2019-10-06 NOTE — Telephone Encounter (Signed)
[name] called to discuss cognitive form. [name] had the same questions as [name] (another representative from his company) had on 09/30/2019 (see telephone phone encounter dated 09/30/2019).  I informed [name] that I left a very detailed message for [name] last week with [employer], NP (covering for Dr.Gupta) response.   [name] took a verbal of the response to the question [name] had last week that was originally left blank.

## 2019-10-07 DIAGNOSIS — R2991 Unspecified symptoms and signs involving the musculoskeletal system: Secondary | ICD-10-CM | POA: Diagnosis not present

## 2019-10-07 DIAGNOSIS — M6281 Muscle weakness (generalized): Secondary | ICD-10-CM | POA: Diagnosis not present

## 2019-10-07 DIAGNOSIS — M810 Age-related osteoporosis without current pathological fracture: Secondary | ICD-10-CM | POA: Diagnosis not present

## 2019-10-07 DIAGNOSIS — M109 Gout, unspecified: Secondary | ICD-10-CM | POA: Diagnosis not present

## 2019-10-07 DIAGNOSIS — R29898 Other symptoms and signs involving the musculoskeletal system: Secondary | ICD-10-CM | POA: Diagnosis not present

## 2019-10-07 DIAGNOSIS — G609 Hereditary and idiopathic neuropathy, unspecified: Secondary | ICD-10-CM | POA: Diagnosis not present

## 2019-10-10 ENCOUNTER — Telehealth: Payer: Self-pay | Admitting: *Deleted

## 2019-10-10 NOTE — Telephone Encounter (Signed)
Patient son, Jeneen Rinks called and is requesting to speak with you Directly. Stated that it is regarding the paperwork for [employer] for [employer]. Stated that he does not want any form filled out for them.  Stated that is is important that he speaks with you.  Please call him [phone]

## 2019-10-11 NOTE — Telephone Encounter (Signed)
Have d/w the son and he is going to let me know what Paper work he needs

## 2019-10-11 NOTE — Telephone Encounter (Signed)
Patient son, Jeneen Rinks called and stated that he spoke with [employer] yesterday and Filled out his Paperwork. Stated that it is fine now to disclose patient's Medical Records and forms to [employer].   Son is also still Requesting that it is Important that he speaks with you regarding patient and wants you to call him at [phone]

## 2019-10-18 ENCOUNTER — Telehealth: Payer: Self-pay

## 2019-10-18 NOTE — Telephone Encounter (Signed)
[employer] is following up to see if you have received and reviewed the fax that was sent on 10/10/2019 [name] is the person who called name. The form was given to [name] so hopefully the records can get sent to them so.

## 2019-11-09 DIAGNOSIS — I10 Essential (primary) hypertension: Secondary | ICD-10-CM | POA: Diagnosis not present

## 2019-11-09 DIAGNOSIS — C679 Malignant neoplasm of bladder, unspecified: Secondary | ICD-10-CM | POA: Diagnosis not present

## 2019-11-09 DIAGNOSIS — R1310 Dysphagia, unspecified: Secondary | ICD-10-CM | POA: Diagnosis not present

## 2019-11-09 DIAGNOSIS — R41841 Cognitive communication deficit: Secondary | ICD-10-CM | POA: Diagnosis not present

## 2019-11-09 DIAGNOSIS — G609 Hereditary and idiopathic neuropathy, unspecified: Secondary | ICD-10-CM | POA: Diagnosis not present

## 2019-11-09 DIAGNOSIS — M109 Gout, unspecified: Secondary | ICD-10-CM | POA: Diagnosis not present

## 2019-11-09 DIAGNOSIS — R2681 Unsteadiness on feet: Secondary | ICD-10-CM | POA: Diagnosis not present

## 2019-11-09 DIAGNOSIS — E039 Hypothyroidism, unspecified: Secondary | ICD-10-CM | POA: Diagnosis not present

## 2019-11-09 DIAGNOSIS — E785 Hyperlipidemia, unspecified: Secondary | ICD-10-CM | POA: Diagnosis not present

## 2019-11-10 DIAGNOSIS — M109 Gout, unspecified: Secondary | ICD-10-CM | POA: Diagnosis not present

## 2019-11-10 DIAGNOSIS — R41841 Cognitive communication deficit: Secondary | ICD-10-CM | POA: Diagnosis not present

## 2019-11-10 DIAGNOSIS — E039 Hypothyroidism, unspecified: Secondary | ICD-10-CM | POA: Diagnosis not present

## 2019-11-10 DIAGNOSIS — G609 Hereditary and idiopathic neuropathy, unspecified: Secondary | ICD-10-CM | POA: Diagnosis not present

## 2019-11-10 DIAGNOSIS — C679 Malignant neoplasm of bladder, unspecified: Secondary | ICD-10-CM | POA: Diagnosis not present

## 2019-11-10 DIAGNOSIS — R1310 Dysphagia, unspecified: Secondary | ICD-10-CM | POA: Diagnosis not present

## 2019-11-11 DIAGNOSIS — R1310 Dysphagia, unspecified: Secondary | ICD-10-CM | POA: Diagnosis not present

## 2019-11-11 DIAGNOSIS — G609 Hereditary and idiopathic neuropathy, unspecified: Secondary | ICD-10-CM | POA: Diagnosis not present

## 2019-11-11 DIAGNOSIS — R41841 Cognitive communication deficit: Secondary | ICD-10-CM | POA: Diagnosis not present

## 2019-11-11 DIAGNOSIS — E039 Hypothyroidism, unspecified: Secondary | ICD-10-CM | POA: Diagnosis not present

## 2019-11-11 DIAGNOSIS — M109 Gout, unspecified: Secondary | ICD-10-CM | POA: Diagnosis not present

## 2019-11-11 DIAGNOSIS — C679 Malignant neoplasm of bladder, unspecified: Secondary | ICD-10-CM | POA: Diagnosis not present

## 2019-11-14 DIAGNOSIS — E039 Hypothyroidism, unspecified: Secondary | ICD-10-CM | POA: Diagnosis not present

## 2019-11-14 DIAGNOSIS — C679 Malignant neoplasm of bladder, unspecified: Secondary | ICD-10-CM | POA: Diagnosis not present

## 2019-11-14 DIAGNOSIS — R1310 Dysphagia, unspecified: Secondary | ICD-10-CM | POA: Diagnosis not present

## 2019-11-14 DIAGNOSIS — G609 Hereditary and idiopathic neuropathy, unspecified: Secondary | ICD-10-CM | POA: Diagnosis not present

## 2019-11-14 DIAGNOSIS — R41841 Cognitive communication deficit: Secondary | ICD-10-CM | POA: Diagnosis not present

## 2019-11-14 DIAGNOSIS — M109 Gout, unspecified: Secondary | ICD-10-CM | POA: Diagnosis not present

## 2019-11-16 DIAGNOSIS — R41841 Cognitive communication deficit: Secondary | ICD-10-CM | POA: Diagnosis not present

## 2019-11-16 DIAGNOSIS — M109 Gout, unspecified: Secondary | ICD-10-CM | POA: Diagnosis not present

## 2019-11-16 DIAGNOSIS — C679 Malignant neoplasm of bladder, unspecified: Secondary | ICD-10-CM | POA: Diagnosis not present

## 2019-11-16 DIAGNOSIS — E039 Hypothyroidism, unspecified: Secondary | ICD-10-CM | POA: Diagnosis not present

## 2019-11-16 DIAGNOSIS — R1310 Dysphagia, unspecified: Secondary | ICD-10-CM | POA: Diagnosis not present

## 2019-11-16 DIAGNOSIS — G609 Hereditary and idiopathic neuropathy, unspecified: Secondary | ICD-10-CM | POA: Diagnosis not present

## 2019-11-21 DIAGNOSIS — C679 Malignant neoplasm of bladder, unspecified: Secondary | ICD-10-CM | POA: Diagnosis not present

## 2019-11-21 DIAGNOSIS — M109 Gout, unspecified: Secondary | ICD-10-CM | POA: Diagnosis not present

## 2019-11-21 DIAGNOSIS — G609 Hereditary and idiopathic neuropathy, unspecified: Secondary | ICD-10-CM | POA: Diagnosis not present

## 2019-11-21 DIAGNOSIS — R41841 Cognitive communication deficit: Secondary | ICD-10-CM | POA: Diagnosis not present

## 2019-11-21 DIAGNOSIS — E039 Hypothyroidism, unspecified: Secondary | ICD-10-CM | POA: Diagnosis not present

## 2019-11-21 DIAGNOSIS — R1310 Dysphagia, unspecified: Secondary | ICD-10-CM | POA: Diagnosis not present

## 2019-11-23 DIAGNOSIS — E039 Hypothyroidism, unspecified: Secondary | ICD-10-CM | POA: Diagnosis not present

## 2019-11-23 DIAGNOSIS — R1310 Dysphagia, unspecified: Secondary | ICD-10-CM | POA: Diagnosis not present

## 2019-11-23 DIAGNOSIS — C679 Malignant neoplasm of bladder, unspecified: Secondary | ICD-10-CM | POA: Diagnosis not present

## 2019-11-23 DIAGNOSIS — G609 Hereditary and idiopathic neuropathy, unspecified: Secondary | ICD-10-CM | POA: Diagnosis not present

## 2019-11-23 DIAGNOSIS — R41841 Cognitive communication deficit: Secondary | ICD-10-CM | POA: Diagnosis not present

## 2019-11-23 DIAGNOSIS — M109 Gout, unspecified: Secondary | ICD-10-CM | POA: Diagnosis not present

## 2019-11-24 DIAGNOSIS — E039 Hypothyroidism, unspecified: Secondary | ICD-10-CM | POA: Diagnosis not present

## 2019-11-24 DIAGNOSIS — G609 Hereditary and idiopathic neuropathy, unspecified: Secondary | ICD-10-CM | POA: Diagnosis not present

## 2019-11-24 DIAGNOSIS — M109 Gout, unspecified: Secondary | ICD-10-CM | POA: Diagnosis not present

## 2019-11-24 DIAGNOSIS — R41841 Cognitive communication deficit: Secondary | ICD-10-CM | POA: Diagnosis not present

## 2019-11-24 DIAGNOSIS — R1310 Dysphagia, unspecified: Secondary | ICD-10-CM | POA: Diagnosis not present

## 2019-11-24 DIAGNOSIS — C679 Malignant neoplasm of bladder, unspecified: Secondary | ICD-10-CM | POA: Diagnosis not present

## 2019-11-28 DIAGNOSIS — G609 Hereditary and idiopathic neuropathy, unspecified: Secondary | ICD-10-CM | POA: Diagnosis not present

## 2019-11-28 DIAGNOSIS — M109 Gout, unspecified: Secondary | ICD-10-CM | POA: Diagnosis not present

## 2019-11-28 DIAGNOSIS — R41841 Cognitive communication deficit: Secondary | ICD-10-CM | POA: Diagnosis not present

## 2019-11-28 DIAGNOSIS — R1310 Dysphagia, unspecified: Secondary | ICD-10-CM | POA: Diagnosis not present

## 2019-11-28 DIAGNOSIS — E039 Hypothyroidism, unspecified: Secondary | ICD-10-CM | POA: Diagnosis not present

## 2019-11-28 DIAGNOSIS — C679 Malignant neoplasm of bladder, unspecified: Secondary | ICD-10-CM | POA: Diagnosis not present

## 2019-11-29 DIAGNOSIS — R1310 Dysphagia, unspecified: Secondary | ICD-10-CM | POA: Diagnosis not present

## 2019-11-29 DIAGNOSIS — C679 Malignant neoplasm of bladder, unspecified: Secondary | ICD-10-CM | POA: Diagnosis not present

## 2019-11-29 DIAGNOSIS — M109 Gout, unspecified: Secondary | ICD-10-CM | POA: Diagnosis not present

## 2019-11-29 DIAGNOSIS — R41841 Cognitive communication deficit: Secondary | ICD-10-CM | POA: Diagnosis not present

## 2019-11-29 DIAGNOSIS — E039 Hypothyroidism, unspecified: Secondary | ICD-10-CM | POA: Diagnosis not present

## 2019-11-29 DIAGNOSIS — G609 Hereditary and idiopathic neuropathy, unspecified: Secondary | ICD-10-CM | POA: Diagnosis not present

## 2019-11-30 DIAGNOSIS — M109 Gout, unspecified: Secondary | ICD-10-CM | POA: Diagnosis not present

## 2019-11-30 DIAGNOSIS — G609 Hereditary and idiopathic neuropathy, unspecified: Secondary | ICD-10-CM | POA: Diagnosis not present

## 2019-11-30 DIAGNOSIS — C679 Malignant neoplasm of bladder, unspecified: Secondary | ICD-10-CM | POA: Diagnosis not present

## 2019-11-30 DIAGNOSIS — E039 Hypothyroidism, unspecified: Secondary | ICD-10-CM | POA: Diagnosis not present

## 2019-11-30 DIAGNOSIS — R1310 Dysphagia, unspecified: Secondary | ICD-10-CM | POA: Diagnosis not present

## 2019-11-30 DIAGNOSIS — R41841 Cognitive communication deficit: Secondary | ICD-10-CM | POA: Diagnosis not present

## 2019-12-01 DIAGNOSIS — M109 Gout, unspecified: Secondary | ICD-10-CM | POA: Diagnosis not present

## 2019-12-01 DIAGNOSIS — R1310 Dysphagia, unspecified: Secondary | ICD-10-CM | POA: Diagnosis not present

## 2019-12-01 DIAGNOSIS — C679 Malignant neoplasm of bladder, unspecified: Secondary | ICD-10-CM | POA: Diagnosis not present

## 2019-12-01 DIAGNOSIS — G609 Hereditary and idiopathic neuropathy, unspecified: Secondary | ICD-10-CM | POA: Diagnosis not present

## 2019-12-01 DIAGNOSIS — R41841 Cognitive communication deficit: Secondary | ICD-10-CM | POA: Diagnosis not present

## 2019-12-01 DIAGNOSIS — E039 Hypothyroidism, unspecified: Secondary | ICD-10-CM | POA: Diagnosis not present

## 2019-12-05 DIAGNOSIS — E039 Hypothyroidism, unspecified: Secondary | ICD-10-CM | POA: Diagnosis not present

## 2019-12-05 DIAGNOSIS — C679 Malignant neoplasm of bladder, unspecified: Secondary | ICD-10-CM | POA: Diagnosis not present

## 2019-12-05 DIAGNOSIS — M109 Gout, unspecified: Secondary | ICD-10-CM | POA: Diagnosis not present

## 2019-12-05 DIAGNOSIS — R1310 Dysphagia, unspecified: Secondary | ICD-10-CM | POA: Diagnosis not present

## 2019-12-05 DIAGNOSIS — R41841 Cognitive communication deficit: Secondary | ICD-10-CM | POA: Diagnosis not present

## 2019-12-05 DIAGNOSIS — G609 Hereditary and idiopathic neuropathy, unspecified: Secondary | ICD-10-CM | POA: Diagnosis not present

## 2019-12-06 DIAGNOSIS — R41841 Cognitive communication deficit: Secondary | ICD-10-CM | POA: Diagnosis not present

## 2019-12-06 DIAGNOSIS — E039 Hypothyroidism, unspecified: Secondary | ICD-10-CM | POA: Diagnosis not present

## 2019-12-06 DIAGNOSIS — G609 Hereditary and idiopathic neuropathy, unspecified: Secondary | ICD-10-CM | POA: Diagnosis not present

## 2019-12-06 DIAGNOSIS — M109 Gout, unspecified: Secondary | ICD-10-CM | POA: Diagnosis not present

## 2019-12-06 DIAGNOSIS — R1310 Dysphagia, unspecified: Secondary | ICD-10-CM | POA: Diagnosis not present

## 2019-12-06 DIAGNOSIS — C679 Malignant neoplasm of bladder, unspecified: Secondary | ICD-10-CM | POA: Diagnosis not present

## 2019-12-08 DIAGNOSIS — R1310 Dysphagia, unspecified: Secondary | ICD-10-CM | POA: Diagnosis not present

## 2019-12-08 DIAGNOSIS — M109 Gout, unspecified: Secondary | ICD-10-CM | POA: Diagnosis not present

## 2019-12-08 DIAGNOSIS — E039 Hypothyroidism, unspecified: Secondary | ICD-10-CM | POA: Diagnosis not present

## 2019-12-08 DIAGNOSIS — G609 Hereditary and idiopathic neuropathy, unspecified: Secondary | ICD-10-CM | POA: Diagnosis not present

## 2019-12-08 DIAGNOSIS — C679 Malignant neoplasm of bladder, unspecified: Secondary | ICD-10-CM | POA: Diagnosis not present

## 2019-12-08 DIAGNOSIS — R41841 Cognitive communication deficit: Secondary | ICD-10-CM | POA: Diagnosis not present

## 2019-12-13 DIAGNOSIS — R41841 Cognitive communication deficit: Secondary | ICD-10-CM | POA: Diagnosis not present

## 2019-12-13 DIAGNOSIS — R1310 Dysphagia, unspecified: Secondary | ICD-10-CM | POA: Diagnosis not present

## 2019-12-14 DIAGNOSIS — R1310 Dysphagia, unspecified: Secondary | ICD-10-CM | POA: Diagnosis not present

## 2019-12-14 DIAGNOSIS — R41841 Cognitive communication deficit: Secondary | ICD-10-CM | POA: Diagnosis not present

## 2019-12-16 DIAGNOSIS — R1310 Dysphagia, unspecified: Secondary | ICD-10-CM | POA: Diagnosis not present

## 2019-12-16 DIAGNOSIS — R41841 Cognitive communication deficit: Secondary | ICD-10-CM | POA: Diagnosis not present

## 2019-12-19 DIAGNOSIS — R41841 Cognitive communication deficit: Secondary | ICD-10-CM | POA: Diagnosis not present

## 2019-12-19 DIAGNOSIS — R1310 Dysphagia, unspecified: Secondary | ICD-10-CM | POA: Diagnosis not present

## 2019-12-20 DIAGNOSIS — R41841 Cognitive communication deficit: Secondary | ICD-10-CM | POA: Diagnosis not present

## 2019-12-20 DIAGNOSIS — R1310 Dysphagia, unspecified: Secondary | ICD-10-CM | POA: Diagnosis not present

## 2019-12-22 DIAGNOSIS — R41841 Cognitive communication deficit: Secondary | ICD-10-CM | POA: Diagnosis not present

## 2019-12-22 DIAGNOSIS — R1310 Dysphagia, unspecified: Secondary | ICD-10-CM | POA: Diagnosis not present

## 2019-12-26 ENCOUNTER — Encounter: Payer: Self-pay | Admitting: Internal Medicine

## 2019-12-26 ENCOUNTER — Non-Acute Institutional Stay: Payer: Medicare Other | Admitting: Internal Medicine

## 2019-12-26 DIAGNOSIS — R4189 Other symptoms and signs involving cognitive functions and awareness: Secondary | ICD-10-CM | POA: Diagnosis not present

## 2019-12-26 DIAGNOSIS — N1831 Chronic kidney disease, stage 3a: Secondary | ICD-10-CM

## 2019-12-26 DIAGNOSIS — M109 Gout, unspecified: Secondary | ICD-10-CM

## 2019-12-26 DIAGNOSIS — I10 Essential (primary) hypertension: Secondary | ICD-10-CM

## 2019-12-26 DIAGNOSIS — E039 Hypothyroidism, unspecified: Secondary | ICD-10-CM

## 2019-12-26 NOTE — Progress Notes (Signed)
Location:  [place] Room Number: 812/A Place of Service:  ALF ([id number]  Provider: Lavinia Sharps  Code Status: DNR Goals of Care:  Advanced Directives 12/26/2019  Does Patient Have a Medical Advance Directive? Yes  Type of Advance Directive Out of facility DNR (pink MOST or yellow form)  Does patient want to make changes to medical advance directive? No - Patient declined  Pre-existing out of facility DNR order (yellow form or pink MOST form) -     Chief Complaint  Patient presents with  . Medical Management of Chronic Issues    Routine Visit of Medical Management  . Health Maintenance    DEXA scan  . Best Practice Recommendations    Pneumonia vaccine    HPI: Patient is a 86 y.o. female seen today for medical management of chronic diseases.    Patient has a history of hypertension,gout, hypothyroidism and peripheral neuropathy and OsteoporosisCognitive impairnment  Lives in AL Doing well No New complains today Walks with the walker. No Falls  No Nursing issues Past Medical History:  Diagnosis Date  . Cognitive changes   . Depression   . Gout   . Hypertension   . Hypothyroidism   . Peripheral neuropathy     History reviewed. No pertinent surgical history.  Allergies  Allergen Reactions  . Actonel [Risedronate Sodium]   . Hct [Hydrochlorothiazide]   . Lipitor [Atorvastatin Calcium]   . Neomycin Other (See Comments)    Unknown "been so long ago, a Dermatologist told me"  . Polysporin [Bacitracin-Polymyxin B]   . Prolia [Denosumab]   . Sulfa Antibiotics   . Zocor [Simvastatin]     Outpatient Encounter Medications as of 12/26/2019  Medication Sig  . allopurinol (ZYLOPRIM) 100 MG tablet Take 100 mg by mouth daily.  . benazepril (LOTENSIN) 10 MG tablet Take 10 mg by mouth daily.  . Calcium Carb-Cholecalciferol 500-400 MG-UNIT TABS Take 2 tablets by mouth daily.   . Cholecalciferol (VITAMIN D3) 50 MCG (2000  UT) capsule Take 2,000 Units by mouth daily.   . Coenzyme Q10 (COQ10) 100 MG CAPS Take 2 capsules by mouth daily.  Marland Kitchen levothyroxine (SYNTHROID) 100 MCG tablet Take 100 mcg by mouth once a week. On Wednesday  . levothyroxine (SYNTHROID) 50 MCG tablet Take 50 mcg by mouth daily before breakfast. Sun,Mon,Tue, Thur,Fri,Sat  . Omega-3 1000 MG CAPS Take 2 capsules by mouth 2 (two) times daily.   . [DISCONTINUED] colchicine 0.6 MG tablet Take 0.6 mg by mouth 2 (two) times daily as needed (gout flares).    No facility-administered encounter medications on file as of 12/26/2019.    Review of Systems:  Review of Systems  Review of Systems  Constitutional: Negative for activity change, appetite change, chills, diaphoresis, fatigue and fever.  HENT: Negative for mouth sores, postnasal drip, rhinorrhea, sinus pain and sore throat.   Respiratory: Negative for apnea, cough, chest tightness, shortness of breath and wheezing.   Cardiovascular: Negative for chest pain, palpitations and leg swelling.  Gastrointestinal: Negative for abdominal distention, abdominal pain, constipation, diarrhea, nausea and vomiting.  Genitourinary: Negative for dysuria and frequency.  Musculoskeletal: Negative for arthralgias, joint swelling and myalgias.  Skin: Negative for rash.  Neurological: Negative for dizziness, syncope, weakness, light-headedness and numbness.  Psychiatric/Behavioral: Negative for behavioral problems, confusion and sleep disturbance.     Health Maintenance  Topic Date Due  . DEXA SCAN  Never done  . PNA vac Low Risk Adult (1 of 2 -  PCV13) Never done  . TETANUS/TDAP  07/14/2029  . INFLUENZA VACCINE  Completed  . COVID-19 Vaccine  Completed    Physical Exam: Vitals:   12/26/19 1652  BP: 122/72  Resp: 18  Temp: (!) 97.5 F (36.4 C)  SpO2: 95%  Weight: 139 lb (63 kg)  Height: 5\' 1"  (1.549 m)   Body mass index is 26.26 kg/m. Physical Exam  Constitutional: Oriented to person, place, and  time. Well-developed and well-nourished.  HENT:  Head: Normocephalic.  Mouth/Throat: Oropharynx is clear and moist.  Eyes: Pupils are equal, round, and reactive to light.  Neck: Neck supple.  Cardiovascular: Normal rate and normal heart sounds.  No murmur heard. Pulmonary/Chest: Effort normal and breath sounds normal. No respiratory distress. No wheezes. She has no rales.  Abdominal: Soft. Bowel sounds are normal. No distension. There is no tenderness. There is no rebound.  Musculoskeletal: No edema.  Lymphadenopathy: none Neurological: Alert and oriented to person, place, and time.  Skin: Skin is warm and dry.  Psychiatric: Normal mood and affect. Behavior is normal. Thought content normal.    Labs reviewed: Basic Metabolic Panel: Recent Labs    03/18/19 0000 07/15/19 1046 08/17/19 0000  NA 142 141 142  K 4.0 3.3* 3.9  CL 107 109 107  CO2 24* 24 23*  GLUCOSE  --  98  --   BUN 25* 18 27*  CREATININE 1.1 1.03* 1.1  CALCIUM 9.1 8.7* 9.4  MG  --  1.9  --   TSH 2.41  --   --    Liver Function Tests: Recent Labs    03/18/19 0000 07/15/19 1046  AST 17 19  ALT 11 15  ALKPHOS 73 61  BILITOT  --  0.5  PROT  --  6.0*  ALBUMIN 3.7 3.1*   No results for input(s): LIPASE, AMYLASE in the last 8760 hours. No results for input(s): AMMONIA in the last 8760 hours. CBC: Recent Labs    03/18/19 0000 07/15/19 1046  WBC 7.6 8.7  NEUTROABS 5,502 7.4  HGB 13.1 12.6  HCT 39 39.3  MCV  --  100.0  PLT 176 170   Lipid Panel: No results for input(s): CHOL, HDL, LDLCALC, TRIG, CHOLHDL, LDLDIRECT in the last 8760 hours. No results found for: HGBA1C  Procedures since last visit: No results found.  Assessment/Plan Essential hypertension On Lisinopril Hypothyroidism,  Repeat TSH Stage 3a chronic kidney disease (HCC) Repeat BMP Gout,  Continue on ALlopurinal Cognitive impairment MMSE was 29 out of 30 in 10/20 CT scan done in ED shows generalized atrophy Continue  Supportive care in [place]    Labs/tests ordered:  [facility]

## 2019-12-27 DIAGNOSIS — I10 Essential (primary) hypertension: Secondary | ICD-10-CM | POA: Diagnosis not present

## 2019-12-27 DIAGNOSIS — E039 Hypothyroidism, unspecified: Secondary | ICD-10-CM | POA: Diagnosis not present

## 2019-12-28 LAB — CBC AND DIFFERENTIAL
HCT: 40 (ref 36–46)
Hemoglobin: 13.4 (ref 12.0–16.0)
Neutrophils Absolute: 6065
Platelets: 220 (ref 150–399)
WBC: 8.4

## 2019-12-28 LAB — HEPATIC FUNCTION PANEL
ALT: 12 (ref 7–35)
AST: 16 (ref 13–35)
Alkaline Phosphatase: 84 (ref 25–125)
Bilirubin, Total: 0.4

## 2019-12-28 LAB — BASIC METABOLIC PANEL
BUN: 23 — AB (ref 4–21)
CO2: 25 — AB (ref 13–22)
Chloride: 107 (ref 99–108)
Creatinine: 1 (ref 0.5–1.1)
Glucose: 89
Potassium: 4.1 (ref 3.4–5.3)
Sodium: 141 (ref 137–147)

## 2019-12-28 LAB — TSH: TSH: 3.54 (ref 0.41–5.90)

## 2019-12-28 LAB — COMPREHENSIVE METABOLIC PANEL
Albumin: 3.7 (ref 3.5–5.0)
Calcium: 9.1 (ref 8.7–10.7)
Globulin: 2.7

## 2019-12-28 LAB — CBC: RBC: 4.25 (ref 3.87–5.11)

## 2020-01-09 ENCOUNTER — Encounter: Payer: Self-pay | Admitting: Nurse Practitioner

## 2020-01-09 ENCOUNTER — Non-Acute Institutional Stay: Payer: Medicare Other | Admitting: Nurse Practitioner

## 2020-01-09 DIAGNOSIS — I10 Essential (primary) hypertension: Secondary | ICD-10-CM

## 2020-01-09 DIAGNOSIS — F0390 Unspecified dementia without behavioral disturbance: Secondary | ICD-10-CM | POA: Insufficient documentation

## 2020-01-09 DIAGNOSIS — R4189 Other symptoms and signs involving cognitive functions and awareness: Secondary | ICD-10-CM

## 2020-01-09 DIAGNOSIS — F0150 Vascular dementia without behavioral disturbance: Secondary | ICD-10-CM | POA: Insufficient documentation

## 2020-01-09 DIAGNOSIS — E039 Hypothyroidism, unspecified: Secondary | ICD-10-CM

## 2020-01-09 DIAGNOSIS — W19XXXA Unspecified fall, initial encounter: Secondary | ICD-10-CM | POA: Diagnosis not present

## 2020-01-09 DIAGNOSIS — R2681 Unsteadiness on feet: Secondary | ICD-10-CM | POA: Diagnosis not present

## 2020-01-09 DIAGNOSIS — M109 Gout, unspecified: Secondary | ICD-10-CM

## 2020-01-09 NOTE — Assessment & Plan Note (Addendum)
Close supervision for safety. 01/02/19 MMSE 29/30, passed clock drawing. Repeat MMSE

## 2020-01-09 NOTE — Progress Notes (Signed)
Location:   [place] Room Number: [place] of Service:  ALF [phone]) Provider:  Marda Stalker, Lennie Odor NP  Virgie Dad, MD  Patient Care Team: Virgie Dad, MD as PCP - General (Internal Medicine) Cordney Barstow X, NP as Nurse Practitioner (Internal Medicine) Virgie Dad, MD as Consulting Physician (Internal Medicine)  Extended Emergency Contact Information Primary Emergency Contact: Bernardini,[name]  Johnnette [place] Phone: [phone] Relation: Son  Code Status:  DNR Goals of care: Advanced Directive information Advanced Directives 12/26/2019  Does Patient Have a Medical Advance Directive? Yes  Type of Advance Directive Out of facility DNR (pink MOST or yellow form)  Does patient want to make changes to medical advance directive? No - Patient declined  Pre-existing out of facility DNR order (yellow form or pink MOST form) -     Chief Complaint  Patient presents with  . Acute Visit    Fall    HPI:  Pt is a 86 y.o. female seen today for an acute visit for fall when the patient was found on the floor, got up herself, no recollection of the event. The patient denied pain, focal weakness, chest pain/pressrue, palpitation upon my visit. She is afebrile.    HTN, blood pressure is controlled on Benazepril 23m qd. Bp 142/66 mmHg upon my visit. Bun/creat 23/1.0 12/28/19              Gout stable, on Allopurinol 1065mqd.        Hypothyroidism, stable, on Levothyroxine. TSH 3.54 12/28/19             [facility], ambulate with walker.    Past Medical History:  Diagnosis Date  . Cognitive changes   . Depression   . Gout   . Hypertension   . Hypothyroidism   . Peripheral neuropathy    History reviewed. No pertinent surgical history.  Allergies  Allergen Reactions  . Actonel [Risedronate Sodium]   . Hct [Hydrochlorothiazide]   . Lipitor [Atorvastatin Calcium]   . Neomycin Other (See Comments)     Unknown "been so long ago, a Dermatologist told me"  . Polysporin [Bacitracin-Polymyxin B]   . Prolia [Denosumab]   . Sulfa Antibiotics   . Zocor [Simvastatin]     Allergies as of 01/09/2020      Reactions   Actonel [risedronate Sodium]    Hct [hydrochlorothiazide]    Lipitor [atorvastatin Calcium]    Neomycin Other (See Comments)   Unknown "been so long ago, a Dermatologist told me"   Polysporin [bacitracin-polymyxin B]    Prolia [denosumab]    Sulfa Antibiotics    Zocor [simvastatin]       Medication List       Accurate as of January 09, 2020 11:59 PM. If you have any questions, ask your nurse or doctor.        allopurinol 100 MG tablet Commonly known as: ZYLOPRIM Take 100 mg by mouth daily.   benazepril 10 MG tablet Commonly known as: LOTENSIN Take 10 mg by mouth daily.   Calcium Carb-Cholecalciferol 500-400 MG-UNIT Tabs Take 2 tablets by mouth daily.   CoQ10 100 MG Caps Take 2 capsules by mouth daily.   levothyroxine 100 MCG tablet Commonly known as: SYNTHROID Take 100 mcg by mouth once a week. On Wednesday   levothyroxine 50 MCG tablet Commonly known as: SYNTHROID Take 50 mcg by mouth daily before breakfast. Sun,Mon,Tue, Thur,Fri,Sat   Omega-3  1000 MG Caps Take 2 capsules by mouth 2 (two) times daily.   Vitamin D3 50 MCG (2000 UT) capsule Take 2,000 Units by mouth daily.       Review of Systems  Constitutional: Negative for activity change, appetite change and fever.  HENT: Positive for hearing loss. Negative for congestion and voice change.   Eyes: Negative for visual disturbance.  Respiratory: Negative for cough and shortness of breath.   Cardiovascular: Negative for leg swelling.  Gastrointestinal: Negative for abdominal pain and constipation.  Genitourinary: Negative for difficulty urinating, dysuria and urgency.  Musculoskeletal: Positive for gait problem.  Skin: Negative for color change.  Neurological: Negative for speech difficulty,  weakness and light-headedness.       Memory lapses.   Psychiatric/Behavioral: Negative for behavioral problems and sleep disturbance. The patient is not nervous/anxious.     Immunization History  Administered Date(s) Administered  . Influenza, High Dose Seasonal PF 12/11/2018  . Influenza-Unspecified 03/24/2018, 12/21/2019  . Moderna SARS-COVID-2 Vaccination 03/15/2019, 04/21/2019  . Tdap 07/15/2019   Pertinent  Health Maintenance Due  Topic Date Due  . DEXA SCAN  Never done  . PNA vac Low Risk Adult (1 of 2 - PCV13) Never done  . INFLUENZA VACCINE  Completed   No flowsheet data found. Functional Status Survey:    Vitals:   01/09/20 1601  BP: (!) 160/90  Pulse: 84  Resp: 16  Temp: (!) 97.1 F (36.2 C)  SpO2: 94%  Weight: 139 lb (63 kg)  Height: '5\' 1"'  (1.549 m)   Body mass index is 26.26 kg/m. Physical Exam Vitals and nursing note reviewed.  Constitutional:      Appearance: Normal appearance.  HENT:     Head: Normocephalic and atraumatic.     Mouth/Throat:     Mouth: Mucous membranes are moist.  Eyes:     Extraocular Movements: Extraocular movements intact.     Conjunctiva/sclera: Conjunctivae normal.     Pupils: Pupils are equal, round, and reactive to light.  Cardiovascular:     Rate and Rhythm: Normal rate and regular rhythm.     Heart sounds: No murmur heard.   Pulmonary:     Breath sounds: No wheezing or rales.  Abdominal:     General: Bowel sounds are normal.     Palpations: Abdomen is soft.     Tenderness: There is no abdominal tenderness.  Musculoskeletal:     Cervical back: Normal range of motion and neck supple.     Right lower leg: No edema.     Left lower leg: No edema.  Skin:    General: Skin is warm and dry.  Neurological:     General: No focal deficit present.     Mental Status: She is alert and oriented to person, place, and time. Mental status is at baseline.     Motor: No weakness.     Gait: Gait abnormal.  Psychiatric:        Mood  and Affect: Mood normal.        Behavior: Behavior normal.     Labs reviewed: Recent Labs    07/15/19 1046 08/17/19 0000 12/28/19 0000  NA 141 142 141  K 3.3* 3.9 4.1  CL 109 107 107  CO2 24 23* 25*  GLUCOSE 98  --   --   BUN 18 27* 23*  CREATININE 1.03* 1.1 1.0  CALCIUM 8.7* 9.4 9.1  MG 1.9  --   --    Recent Labs  03/18/19 0000 07/15/19 1046 12/28/19 0000  AST '17 19 16  ' ALT '11 15 12  ' ALKPHOS 73 61 84  BILITOT  --  0.5  --   PROT  --  6.0*  --   ALBUMIN 3.7 3.1* 3.7   Recent Labs    03/18/19 0000 07/15/19 1046 12/28/19 0000  WBC 7.6 8.7 8.4  NEUTROABS 5,502 7.4 6,065.00  HGB 13.1 12.6 13.4  HCT 39 39.3 40  MCV  --  100.0  --   PLT 176 170 220   Lab Results  Component Value Date   TSH 3.54 12/28/2019   No results found for: HGBA1C No results found for: CHOL, HDL, LDLCALC, LDLDIRECT, TRIG, CHOLHDL  Significant Diagnostic Results in last 30 days:  No results found.  Assessment/Plan Fall No apparent injury, unwitnessed fall, the patient has no recollection of the event. Will do neuro check. Close supervision for safety. Lack of safety and abnormal gait are contributory for her falling. Update CBC/diff, CMP/eGFR. PT to eval/tx as indicated.   HTN (hypertension) HTN, blood pressure is controlled on Benazepril 56m qd. Bp 142/66 mmHg upon my visit. Bun/creat 23/1.0 12/28/19  Gout  Gout stable, on Allopurinol 1032mqd.        Hypothyroidism Hypothyroidism, stable, on Levothyroxine. TSH 3.54 12/28/19   Gait instability [facility], ambulate with walker.   Cognitive impairment Close supervision for safety. 01/02/19 MMSE 29/30, passed clock drawing. Repeat MMSE     Family/ staff Communication: plan of care reviewed with the patient and charge nurse.   Labs/tests ordered:  CBC/diff, CMP/eGFR  Time spend 40 minutes.

## 2020-01-09 NOTE — Assessment & Plan Note (Signed)
Gout stable, on Allopurinol 100mg  qd.

## 2020-01-09 NOTE — Assessment & Plan Note (Signed)
Hypothyroidism, stable, on Levothyroxine. TSH 3.54 12/28/19

## 2020-01-09 NOTE — Assessment & Plan Note (Signed)
the patient resides in AL [facility] for safety, care assistance, ambulate with walker.

## 2020-01-09 NOTE — Assessment & Plan Note (Addendum)
HTN, blood pressure is controlled on Benazepril 10mg  qd. Bp 142/66 mmHg upon my visit. Bun/creat 23/1.0 12/28/19

## 2020-01-09 NOTE — Assessment & Plan Note (Addendum)
No apparent injury, unwitnessed fall, the patient has no recollection of the event. Will do neuro check. Close supervision for safety. Lack of safety and abnormal gait are contributory for her falling. Update CBC/diff, CMP/eGFR. PT to eval/tx as indicated.

## 2020-01-10 ENCOUNTER — Encounter: Payer: Self-pay | Admitting: Nurse Practitioner

## 2020-01-10 DIAGNOSIS — I10 Essential (primary) hypertension: Secondary | ICD-10-CM | POA: Diagnosis not present

## 2020-01-11 ENCOUNTER — Non-Acute Institutional Stay: Payer: Medicare Other | Admitting: Nurse Practitioner

## 2020-01-11 ENCOUNTER — Encounter: Payer: Self-pay | Admitting: Nurse Practitioner

## 2020-01-11 DIAGNOSIS — I10 Essential (primary) hypertension: Secondary | ICD-10-CM | POA: Diagnosis not present

## 2020-01-11 DIAGNOSIS — F0150 Vascular dementia without behavioral disturbance: Secondary | ICD-10-CM | POA: Diagnosis not present

## 2020-01-11 DIAGNOSIS — E039 Hypothyroidism, unspecified: Secondary | ICD-10-CM | POA: Diagnosis not present

## 2020-01-11 DIAGNOSIS — M109 Gout, unspecified: Secondary | ICD-10-CM | POA: Diagnosis not present

## 2020-01-11 LAB — BASIC METABOLIC PANEL
BUN: 23 — AB (ref 4–21)
CO2: 25 — AB (ref 13–22)
Chloride: 106 (ref 99–108)
Creatinine: 1.2 — AB (ref 0.5–1.1)
Glucose: 88
Potassium: 4.1 (ref 3.4–5.3)
Sodium: 140 (ref 137–147)

## 2020-01-11 LAB — COMPREHENSIVE METABOLIC PANEL
Albumin: 3.7 (ref 3.5–5.0)
Calcium: 9.4 (ref 8.7–10.7)
Globulin: 2.4

## 2020-01-11 LAB — CBC AND DIFFERENTIAL
HCT: 39 (ref 36–46)
Hemoglobin: 13.2 (ref 12.0–16.0)
Platelets: 214 (ref 150–399)
WBC: 7.8

## 2020-01-11 LAB — CBC: RBC: 4.15 (ref 3.87–5.11)

## 2020-01-11 LAB — HEPATIC FUNCTION PANEL
ALT: 10 (ref 7–35)
AST: 17 (ref 13–35)
Alkaline Phosphatase: 78 (ref 25–125)
Bilirubin, Total: 0.5

## 2020-01-11 NOTE — Assessment & Plan Note (Signed)
Hypothyroidism, stable, on Levothyroxine. TSH 3.54 12/28/19

## 2020-01-11 NOTE — Assessment & Plan Note (Signed)
Gout stable, on Allopurinol 100mg  qd.

## 2020-01-11 NOTE — Assessment & Plan Note (Addendum)
cognitive decline, MMSE dropped from 01/02/19 29/30 to11/3/21 MMSE 18/30, failed clock drawing. 07/15/19 CT head Mild generalized parenchymal atrophy and chronic small vessel ischemic disease Recommend Memantine to preserve memory if HPOA consents.

## 2020-01-11 NOTE — Progress Notes (Signed)
Location:  [place] Room Number: [place] of Service:  ALF [phone]) Provider: Marlana Latus NP  Virgie Dad, MD  Patient Care Team: Virgie Dad, MD as PCP - General (Internal Medicine) Jayleene Glaeser X, NP as Nurse Practitioner (Internal Medicine) Virgie Dad, MD as Consulting Physician (Internal Medicine)  Extended Emergency Contact Information Primary Emergency Contact: Mclucas,[name]  Johnnette Litter of [place] Phone: [phone] Relation: Son  Code Status: DNR Goals of care: Advanced Directive information Advanced Directives 12/26/2019  Does Patient Have a Medical Advance Directive? Yes  Type of Advance Directive Out of facility DNR (pink MOST or yellow form)  Does patient want to make changes to medical advance directive? No - Patient declined  Pre-existing out of facility DNR order (yellow form or pink MOST form) -     Chief Complaint  Patient presents with  . Acute Visit    HPI:  Pt is a 86 y.o. female seen today for an acute visit for cognitive decline, MMSE dropped from 01/02/19 29/30 to11/3/21 MMSE 18/30, failed clock drawing. 07/15/19 CT head Mild generalized parenchymal atrophy and chronic small vessel ischemic disease  HTN, blood pressure is controlled on Benazepril 10mg  qd. Bun/creat 23/1.0 12/28/19 Gout stable, on Allopurinol 100mg  qd.                                    Hypothyroidism, stable, on Levothyroxine. TSH 3.54 12/28/19 The patient resides in [place] [facility] for safety, care assistance, ambulate with walker.               Past Medical History:  Diagnosis Date  . Cognitive changes   . Depression   . Gout   . Hypertension   . Hypothyroidism   . Peripheral neuropathy    History reviewed. No pertinent surgical history.  Allergies  Allergen Reactions  . Actonel [Risedronate Sodium]   . Hct [Hydrochlorothiazide]   . Lipitor [Atorvastatin Calcium]   . Neomycin Other (See Comments)    Unknown  "been so long ago, a Dermatologist told me"  . Polysporin [Bacitracin-Polymyxin B]   . Prolia [Denosumab]   . Sulfa Antibiotics   . Zocor [Simvastatin]     Allergies as of 01/11/2020      Reactions   Actonel [risedronate Sodium]    Hct [hydrochlorothiazide]    Lipitor [atorvastatin Calcium]    Neomycin Other (See Comments)   Unknown "been so long ago, a Dermatologist told me"   Polysporin [bacitracin-polymyxin B]    Prolia [denosumab]    Sulfa Antibiotics    Zocor [simvastatin]       Medication List       Accurate as of January 11, 2020 11:59 PM. If you have any questions, ask your nurse or doctor.        allopurinol 100 MG tablet Commonly known as: ZYLOPRIM Take 100 mg by mouth daily.   benazepril 10 MG tablet Commonly known as: LOTENSIN Take 10 mg by mouth daily.   Calcium Carb-Cholecalciferol 500-400 MG-UNIT Tabs Take 2 tablets by mouth daily.   CoQ10 100 MG Caps Take 2 capsules by mouth daily.   levothyroxine 100 MCG tablet Commonly known as: SYNTHROID Take 100 mcg by mouth once a week. On Wednesday   levothyroxine 50 MCG tablet Commonly known as: SYNTHROID Take 50 mcg by mouth daily before breakfast. Sun,Mon,Tue, Thur,Fri,Sat   memantine 5 MG tablet Commonly known as: NAMENDA  Take 5 mg by mouth daily.   memantine 5 MG tablet Commonly known as: NAMENDA Take 5 mg by mouth 2 (two) times daily. Start taking on: January 19, 2020   memantine 10 MG tablet Commonly known as: NAMENDA Take 10 mg by mouth daily. Start taking on: January 26, 2020   memantine 5 MG tablet Commonly known as: NAMENDA Take 5 mg by mouth at bedtime. Start taking on: January 26, 2020   memantine 10 MG tablet Commonly known as: NAMENDA Take 10 mg by mouth 2 (two) times daily. Start taking on: February 09, 2020   Omega-3 1000 MG Caps Take 2 capsules by mouth 2 (two) times daily.   Vitamin D3 50 MCG (2000 UT) capsule Take 2,000 Units by mouth daily.       Review of  Systems  Constitutional: Negative for activity change, appetite change and fever.  HENT: Positive for hearing loss. Negative for congestion and voice change.   Eyes: Negative for visual disturbance.  Respiratory: Negative for cough and shortness of breath.   Cardiovascular: Negative for leg swelling.  Gastrointestinal: Negative for abdominal pain and constipation.  Genitourinary: Negative for difficulty urinating, dysuria and urgency.  Musculoskeletal: Positive for gait problem.  Skin: Negative for color change.  Neurological: Negative for speech difficulty, weakness and light-headedness.       Memory lapses.   Psychiatric/Behavioral: Negative for behavioral problems and sleep disturbance. The patient is not nervous/anxious.     Immunization History  Administered Date(s) Administered  . Influenza, High Dose Seasonal PF 12/11/2018  . Influenza-Unspecified 03/24/2018, 12/21/2019  . Moderna SARS-COVID-2 Vaccination 03/15/2019, 04/21/2019  . Tdap 07/15/2019   Pertinent  Health Maintenance Due  Topic Date Due  . DEXA SCAN  Never done  . PNA vac Low Risk Adult (1 of 2 - PCV13) Never done  . INFLUENZA VACCINE  Completed   No flowsheet data found. Functional Status Survey:    Vitals:   01/13/20 1417  BP: 132/62  Pulse: 70  Resp: 20  Temp: 98 F (36.7 C)  SpO2: 97%  Weight: 139 lb (63 kg)  Height: 5\' 1"  (1.549 m)   Body mass index is 26.26 kg/m. Physical Exam Vitals and nursing note reviewed.  Constitutional:      Appearance: Normal appearance.  HENT:     Head: Normocephalic and atraumatic.     Mouth/Throat:     Mouth: Mucous membranes are moist.  Eyes:     Extraocular Movements: Extraocular movements intact.     Conjunctiva/sclera: Conjunctivae normal.     Pupils: Pupils are equal, round, and reactive to light.  Cardiovascular:     Rate and Rhythm: Normal rate and regular rhythm.     Heart sounds: No murmur heard.   Pulmonary:     Breath sounds: No wheezing or  rales.  Abdominal:     General: Bowel sounds are normal.     Palpations: Abdomen is soft.     Tenderness: There is no abdominal tenderness.  Musculoskeletal:     Cervical back: Normal range of motion and neck supple.     Right lower leg: No edema.     Left lower leg: No edema.  Skin:    General: Skin is warm and dry.  Neurological:     General: No focal deficit present.     Mental Status: She is alert and oriented to person, place, and time. Mental status is at baseline.     Motor: No weakness.     Gait:  Gait abnormal.     Comments: Ambulates with walker  Psychiatric:        Mood and Affect: Mood normal.        Behavior: Behavior normal.     Labs reviewed: Recent Labs    07/15/19 1046 08/17/19 0000 12/28/19 0000  NA 141 142 141  K 3.3* 3.9 4.1  CL 109 107 107  CO2 24 23* 25*  GLUCOSE 98  --   --   BUN 18 27* 23*  CREATININE 1.03* 1.1 1.0  CALCIUM 8.7* 9.4 9.1  MG 1.9  --   --    Recent Labs    03/18/19 0000 07/15/19 1046 12/28/19 0000  AST 17 19 16   ALT 11 15 12   ALKPHOS 73 61 84  BILITOT  --  0.5  --   PROT  --  6.0*  --   ALBUMIN 3.7 3.1* 3.7   Recent Labs    03/18/19 0000 07/15/19 1046 12/28/19 0000  WBC 7.6 8.7 8.4  NEUTROABS 5,502 7.4 6,065.00  HGB 13.1 12.6 13.4  HCT 39 39.3 40  MCV  --  100.0  --   PLT 176 170 220   Lab Results  Component Value Date   TSH 3.54 12/28/2019   No results found for: HGBA1C No results found for: CHOL, HDL, LDLCALC, LDLDIRECT, TRIG, CHOLHDL  Significant Diagnostic Results in last 30 days:  No results found.  Assessment/Plan: Vascular dementia ([place]) cognitive decline, MMSE dropped from 01/02/19 29/30 to11/3/21 MMSE 18/30, failed clock drawing. 07/15/19 CT head Mild generalized parenchymal atrophy and chronic small vessel ischemic disease Recommend Memantine to preserve memory if HPOA consents.   HTN (hypertension) HTN, blood pressure is controlled on Benazepril 10mg  qd. Bun/creat 23/1.0  12/28/19   Gout Gout stable, on Allopurinol 100mg  qd.   Hypothyroidism Hypothyroidism, stable, on Levothyroxine. TSH 3.54 12/28/19     Family/ staff Communication: plan of care reviewed with the patient and charge nurse.   Labs/tests ordered:    Time spend 40 minutes.

## 2020-01-11 NOTE — Assessment & Plan Note (Signed)
HTN, blood pressure is controlled on Benazepril 10mg  qd. Bun/creat 23/1.0 12/28/19

## 2020-01-13 ENCOUNTER — Encounter: Payer: Self-pay | Admitting: Nurse Practitioner

## 2020-01-16 ENCOUNTER — Encounter: Payer: Self-pay | Admitting: Nurse Practitioner

## 2020-01-17 DIAGNOSIS — Z23 Encounter for immunization: Secondary | ICD-10-CM | POA: Diagnosis not present

## 2020-03-15 DIAGNOSIS — R1310 Dysphagia, unspecified: Secondary | ICD-10-CM | POA: Diagnosis not present

## 2020-03-15 DIAGNOSIS — R1312 Dysphagia, oropharyngeal phase: Secondary | ICD-10-CM | POA: Diagnosis not present

## 2020-03-23 ENCOUNTER — Encounter: Payer: Self-pay | Admitting: Nurse Practitioner

## 2020-03-23 ENCOUNTER — Non-Acute Institutional Stay: Payer: Medicare Other | Admitting: Nurse Practitioner

## 2020-03-23 DIAGNOSIS — E039 Hypothyroidism, unspecified: Secondary | ICD-10-CM

## 2020-03-23 DIAGNOSIS — F0150 Vascular dementia without behavioral disturbance: Secondary | ICD-10-CM

## 2020-03-23 DIAGNOSIS — M109 Gout, unspecified: Secondary | ICD-10-CM | POA: Diagnosis not present

## 2020-03-23 DIAGNOSIS — I10 Essential (primary) hypertension: Secondary | ICD-10-CM

## 2020-03-23 NOTE — Assessment & Plan Note (Signed)
Vascular dementia, MMSE dropped from 01/02/19 29/30 to11/3/21 MMSE 18/30, failed clock drawing. 07/15/19 CT head Mild generalized parenchymal atrophy and chronic small vessel ischemic disease, tolerated Memantine well.    

## 2020-03-23 NOTE — Assessment & Plan Note (Signed)
Gout stable, on Allopurinol 100mg qd.  

## 2020-03-23 NOTE — Progress Notes (Signed)
Location:   [facility] Room Number: [place] of Service:  ALF [phone]) Provider:  Marda Stalker, Lennie Odor NP  Virgie Dad, MD  Patient Care Team: Virgie Dad, MD as PCP - General (Internal Medicine) Serrina Minogue X, NP as Nurse Practitioner (Internal Medicine) Virgie Dad, MD as Consulting Physician (Internal Medicine)  Extended Emergency Contact Information Primary Emergency Contact: Woodfield,[name]  Johnnette Litter of [place] Phone: [phone] Relation: Son  Code Status:  DNR Goals of care: Advanced Directive information Advanced Directives 12/26/2019  Does Patient Have a Medical Advance Directive? Yes  Type of Advance Directive Out of facility DNR (pink MOST or yellow form)  Does patient want to make changes to medical advance directive? No - Patient declined  Pre-existing out of facility DNR order (yellow form or pink MOST form) -     Chief Complaint  Patient presents with  . Medical Management of Chronic Issues  . Health Maintenance    Dexa, PCV 13    HPI:  Pt is a 87 y.o. female seen today for medical management of chronic diseases.    Vascular dementia, MMSE dropped from 01/02/19 29/30 to11/3/21 MMSE 18/30, failed clock drawing. 07/15/19 CT head Mild generalized parenchymal atrophy and chronic small vessel ischemic disease, tolerated Memantine well.  HTN, blood pressure is controlled on Benazepril 10mg  qd. Bun/creat 23/1.2 01/11/20  Gout stable, on Allopurinol 100mg  qd. Hypothyroidism, stable, on Levothyroxine. TSH 3.54 12/28/19    Past Medical History:  Diagnosis Date  . Cognitive changes   . Depression   . Gout   . Hypertension   . Hypothyroidism   . Peripheral neuropathy    History reviewed. No pertinent surgical history.  Allergies  Allergen Reactions  . Actonel [Risedronate Sodium]   . Hct [Hydrochlorothiazide]   . Lipitor [Atorvastatin Calcium]   . Neomycin Other (See Comments)    Unknown "been so long  ago, a Dermatologist told me"  . Polysporin [Bacitracin-Polymyxin B]   . Prolia [Denosumab]   . Sulfa Antibiotics   . Zocor [Simvastatin]     Allergies as of 03/23/2020      Reactions   Actonel [risedronate Sodium]    Hct [hydrochlorothiazide]    Lipitor [atorvastatin Calcium]    Neomycin Other (See Comments)   Unknown "been so long ago, a Dermatologist told me"   Polysporin [bacitracin-polymyxin B]    Prolia [denosumab]    Sulfa Antibiotics    Zocor [simvastatin]       Medication List       Accurate as of March 23, 2020  2:34 PM. If you have any questions, ask your nurse or doctor.        allopurinol 100 MG tablet Commonly known as: ZYLOPRIM Take 100 mg by mouth daily.   benazepril 10 MG tablet Commonly known as: LOTENSIN Take 10 mg by mouth daily.   Calcium Carb-Cholecalciferol 500-400 MG-UNIT Tabs Take 2 tablets by mouth daily.   CoQ10 100 MG Caps Take 2 capsules by mouth daily.   levothyroxine 100 MCG tablet Commonly known as: SYNTHROID Take 100 mcg by mouth once a week. On Wednesday   levothyroxine 50 MCG tablet Commonly known as: SYNTHROID Take 50 mcg by mouth daily before breakfast. Sun,Mon,Tue, Thur,Fri,Sat   memantine 5 MG tablet Commonly known as: NAMENDA Take 5 mg by mouth 2 (two) times daily. What changed: Another medication with the same name was removed. Continue taking this medication, and follow the directions you see here. Changed by: Breanna Mcdaniel X  Cienna Dumais, NP   memantine 5 MG tablet Commonly known as: NAMENDA Take 5 mg by mouth at bedtime. What changed: Another medication with the same name was removed. Continue taking this medication, and follow the directions you see here. Changed by: Jeaninne Lodico X Latica Hohmann, NP   memantine 10 MG tablet Commonly known as: NAMENDA Take 10 mg by mouth 2 (two) times daily. What changed: Another medication with the same name was removed. Continue taking this medication, and follow the directions you see here. Changed by: Jaion Lagrange  X Darra Rosa, NP   Omega-3 1000 MG Caps Take 2 capsules by mouth 2 (two) times daily.   Vitamin D3 50 MCG (2000 UT) capsule Take 2,000 Units by mouth daily.       Review of Systems  Constitutional: Negative for fatigue, fever and unexpected weight change.  HENT: Positive for hearing loss. Negative for congestion and voice change.   Eyes: Negative for visual disturbance.  Respiratory: Negative for cough and shortness of breath.   Cardiovascular: Negative for leg swelling.  Gastrointestinal: Negative for abdominal pain and constipation.  Genitourinary: Negative for difficulty urinating, dysuria and urgency.  Musculoskeletal: Positive for gait problem.  Skin: Negative for color change.  Neurological: Negative for speech difficulty, weakness and headaches.       Memory lapses.   Psychiatric/Behavioral: Negative for behavioral problems and sleep disturbance. The patient is not nervous/anxious.     Immunization History  Administered Date(s) Administered  . Influenza, High Dose Seasonal PF 12/11/2018  . Influenza-Unspecified 03/24/2018, 12/21/2019  . Moderna Sars-Covid-2 Vaccination 03/15/2019, 04/21/2019, 01/17/2020  . Tdap 07/15/2019   Pertinent  Health Maintenance Due  Topic Date Due  . DEXA SCAN  Never done  . PNA vac Low Risk Adult (1 of 2 - PCV13) Never done  . INFLUENZA VACCINE  Completed   No flowsheet data found. Functional Status Survey:    Vitals:   03/23/20 0945  BP: 110/68  Pulse: 79  Resp: 20  Temp: (!) 97.3 F (36.3 C)  SpO2: 97%  Weight: 140 lb 9.6 oz (63.8 kg)  Height: 5\' 1"  (1.549 m)   Body mass index is 26.57 kg/m. Physical Exam Vitals and nursing note reviewed.  Constitutional:      Appearance: Normal appearance.  HENT:     Head: Normocephalic and atraumatic.     Mouth/Throat:     Mouth: Mucous membranes are moist.  Eyes:     Extraocular Movements: Extraocular movements intact.     Conjunctiva/sclera: Conjunctivae normal.     Pupils: Pupils  are equal, round, and reactive to light.  Cardiovascular:     Rate and Rhythm: Normal rate and regular rhythm.     Heart sounds: No murmur heard.   Pulmonary:     Breath sounds: No rales.  Abdominal:     General: Bowel sounds are normal.     Palpations: Abdomen is soft.     Tenderness: There is no abdominal tenderness.  Musculoskeletal:     Cervical back: Normal range of motion and neck supple.     Right lower leg: No edema.     Left lower leg: No edema.  Skin:    General: Skin is warm and dry.  Neurological:     General: No focal deficit present.     Mental Status: She is alert and oriented to person, place, and time. Mental status is at baseline.     Motor: No weakness.     Gait: Gait abnormal.     Comments:  Ambulates with walker  Psychiatric:        Mood and Affect: Mood normal.        Behavior: Behavior normal.     Labs reviewed: Recent Labs    07/15/19 1046 08/17/19 0000 12/28/19 0000 01/11/20 0000  NA 141 142 141 140  K 3.3* 3.9 4.1 4.1  CL 109 107 107 106  CO2 24 23* 25* 25*  GLUCOSE 98  --   --   --   BUN 18 27* 23* 23*  CREATININE 1.03* 1.1 1.0 1.2*  CALCIUM 8.7* 9.4 9.1 9.4  MG 1.9  --   --   --    Recent Labs    07/15/19 1046 12/28/19 0000 01/11/20 0000  AST 19 16 17   ALT 15 12 10   ALKPHOS 61 84 78  BILITOT 0.5  --   --   PROT 6.0*  --   --   ALBUMIN 3.1* 3.7 3.7   Recent Labs    07/15/19 1046 12/28/19 0000 01/11/20 0000  WBC 8.7 8.4 7.8  NEUTROABS 7.4 6,065.00  --   HGB 12.6 13.4 13.2  HCT 39.3 40 39  MCV 100.0  --   --   PLT 170 220 214   Lab Results  Component Value Date   TSH 3.54 12/28/2019   No results found for: HGBA1C No results found for: CHOL, HDL, LDLCALC, LDLDIRECT, TRIG, CHOLHDL  Significant Diagnostic Results in last 30 days:  No results found.  Assessment/Plan HTN (hypertension) HTN, blood pressure is controlled on Benazepril 10mg  qd. Bun/creat 23/1.2 01/11/20  Gout Gout stable, on Allopurinol 100mg   qd.  Hypothyroidism Hypothyroidism, stable, on Levothyroxine. TSH 3.54 12/28/19    Vascular dementia ([place]) Vascular dementia, MMSE dropped from 01/02/19 29/30 to11/3/21 MMSE 18/30, failed clock drawing. 07/15/19 CT head Mild generalized parenchymal atrophy and chronic small vessel ischemic disease, tolerated Memantine well.    Family/ staff Communication: plan of care reviewed with the patient and charge nurse.   Labs/tests ordered:  none  Time spend 40 minutes.

## 2020-03-23 NOTE — Assessment & Plan Note (Signed)
Hypothyroidism, stable, on Levothyroxine. TSH 3.54 12/28/19  

## 2020-03-23 NOTE — Assessment & Plan Note (Signed)
HTN, blood pressure is controlled on Benazepril 10mg  qd. Bun/creat 23/1.2 01/11/20

## 2020-06-19 ENCOUNTER — Encounter: Payer: Self-pay | Admitting: Internal Medicine

## 2020-06-19 ENCOUNTER — Non-Acute Institutional Stay: Payer: Medicare Other | Admitting: Internal Medicine

## 2020-06-19 DIAGNOSIS — N1831 Chronic kidney disease, stage 3a: Secondary | ICD-10-CM

## 2020-06-19 DIAGNOSIS — I10 Essential (primary) hypertension: Secondary | ICD-10-CM | POA: Diagnosis not present

## 2020-06-19 DIAGNOSIS — M109 Gout, unspecified: Secondary | ICD-10-CM

## 2020-06-19 DIAGNOSIS — F0150 Vascular dementia without behavioral disturbance: Secondary | ICD-10-CM | POA: Diagnosis not present

## 2020-06-19 DIAGNOSIS — E039 Hypothyroidism, unspecified: Secondary | ICD-10-CM

## 2020-06-19 NOTE — Progress Notes (Signed)
Location:  [facility] Room Number: 378 Place of Service:  ALF [phone])  Provider: Veleta Miners MD  Code Status: DNR Goals of Care:  Advanced Directives 12/26/2019  Does Patient Have a Medical Advance Directive? Yes  Type of Advance Directive Out of facility DNR (pink MOST or yellow form)  Does patient want to make changes to medical advance directive? No - Patient declined  Pre-existing out of facility DNR order (yellow form or pink MOST form) -     Chief Complaint  Patient presents with  . Medical Management of Chronic Issues  . Health Maintenance    Dexa scan, PCV13    HPI: Patient is a 87 y.o. female seen today for medical management of chronic diseases.    Patient has a history of hypertension,gout, hypothyroidism and peripheral neuropathy and OsteoporosisCognitive impairment  Lives in [place] Has gained weight good Appetite Walks with her walker No Falls No New Nursing issues Mood stable. Works on [employer] step Every day Past Medical History:  Diagnosis Date  . Cognitive changes   . Depression   . Gout   . Hypertension   . Hypothyroidism   . Peripheral neuropathy     History reviewed. No pertinent surgical history.  Allergies  Allergen Reactions  . Actonel [Risedronate Sodium]   . Hct [Hydrochlorothiazide]   . Lipitor [Atorvastatin Calcium]   . Neomycin Other (See Comments)    Unknown "been so long ago, a Dermatologist told me"  . Polysporin [Bacitracin-Polymyxin B]   . Prolia [Denosumab]   . Sulfa Antibiotics   . Zocor [Simvastatin]     Outpatient Encounter Medications as of 06/19/2020  Medication Sig  . allopurinol (ZYLOPRIM) 100 MG tablet Take 100 mg by mouth daily.  . benazepril (LOTENSIN) 10 MG tablet Take 10 mg by mouth daily.  . Calcium Carb-Cholecalciferol 500-400 MG-UNIT TABS Take 2 tablets by mouth daily.   . Cholecalciferol (VITAMIN D3) 50 MCG (2000 UT) capsule Take 2,000 Units by mouth daily.   . Coenzyme Q10 (COQ10) 100  MG CAPS Take 2 capsules by mouth daily.  Marland Kitchen levothyroxine (SYNTHROID) 100 MCG tablet Take 100 mcg by mouth once a week. On Wednesday  . levothyroxine (SYNTHROID) 50 MCG tablet Take 50 mcg by mouth daily before breakfast. Sun,Mon,Tue, Thur,Fri,Sat  . memantine (NAMENDA) 10 MG tablet Take 10 mg by mouth 2 (two) times daily.  . Omega-3 1000 MG CAPS Take 2 capsules by mouth 2 (two) times daily.   . [DISCONTINUED] memantine (NAMENDA) 5 MG tablet Take 5 mg by mouth 2 (two) times daily.  . [DISCONTINUED] memantine (NAMENDA) 5 MG tablet Take 5 mg by mouth at bedtime.   No facility-administered encounter medications on file as of 06/19/2020.    Review of Systems:  Review of Systems  Review of Systems  Constitutional: Negative for activity change, appetite change, chills, diaphoresis, fatigue and fever.  HENT: Negative for mouth sores, postnasal drip, rhinorrhea, sinus pain and sore throat.   Respiratory: Negative for apnea, cough, chest tightness, shortness of breath and wheezing.   Cardiovascular: Negative for chest pain, palpitations and leg swelling.  Gastrointestinal: Negative for abdominal distention, abdominal pain, constipation, diarrhea, nausea and vomiting.  Genitourinary: Negative for dysuria and frequency.  Musculoskeletal: Negative for arthralgias, joint swelling and myalgias.  Skin: Negative for rash.  Neurological: Negative for dizziness, syncope, weakness, light-headedness and numbness.  Psychiatric/Behavioral: Negative for behavioral problems, confusion and sleep disturbance.     Health Maintenance  Topic Date Due  .  DEXA SCAN  Never done  . PNA vac Low Risk Adult (1 of 2 - PCV13) Never done  . INFLUENZA VACCINE  10/08/2020  . TETANUS/TDAP  07/14/2029  . COVID-19 Vaccine  Completed  . HPV VACCINES  Aged Out    Physical Exam: Vitals:   06/19/20 1514  BP: 138/74  Pulse: 82  Resp: 18  Temp: 98.1 F (36.7 C)  SpO2: 94%  Weight: 144 lb 12.8 oz (65.7 kg)  Height: 5\' 1"   (1.549 m)   Body mass index is 27.36 kg/m. Physical Exam  Constitutional: Oriented to person, place, and time. Well-developed and well-nourished.  HENT:  Head: Normocephalic.  Mouth/Throat: Oropharynx is clear and moist.  Eyes: Pupils are equal, round, and reactive to light.  Neck: Neck supple.  Cardiovascular: Normal rate and normal heart sounds.  No murmur heard. Pulmonary/Chest: Effort normal and breath sounds normal. No respiratory distress. No wheezes. She has no rales.  Abdominal: Soft. Bowel sounds are normal. No distension. There is no tenderness. There is no rebound.  Musculoskeletal: No edema.  Lymphadenopathy: none Neurological: Alert and oriented to person, place, and time.  Skin: Skin is warm and dry.  Psychiatric: Normal mood and affect. Behavior is normal. Thought content normal.    Labs reviewed: Basic Metabolic Panel: Recent Labs    07/15/19 1046 08/17/19 0000 12/28/19 0000 01/11/20 0000  NA 141 142 141 140  K 3.3* 3.9 4.1 4.1  CL 109 107 107 106  CO2 24 23* 25* 25*  GLUCOSE 98  --   --   --   BUN 18 27* 23* 23*  CREATININE 1.03* 1.1 1.0 1.2*  CALCIUM 8.7* 9.4 9.1 9.4  MG 1.9  --   --   --   TSH  --   --  3.54  --    Liver Function Tests: Recent Labs    07/15/19 1046 12/28/19 0000 01/11/20 0000  AST 19 16 17   ALT 15 12 10   ALKPHOS 61 84 78  BILITOT 0.5  --   --   PROT 6.0*  --   --   ALBUMIN 3.1* 3.7 3.7   No results for input(s): LIPASE, AMYLASE in the last 8760 hours. No results for input(s): AMMONIA in the last 8760 hours. CBC: Recent Labs    07/15/19 1046 12/28/19 0000 01/11/20 0000  WBC 8.7 8.4 7.8  NEUTROABS 7.4 6,065.00  --   HGB 12.6 13.4 13.2  HCT 39.3 40 39  MCV 100.0  --   --   PLT 170 220 214   Lipid Panel: No results for input(s): CHOL, HDL, LDLCALC, TRIG, CHOLHDL, LDLDIRECT in the last 8760 hours. No results found for: HGBA1C  Procedures since last visit: No results found.  Assessment/Plan Primary  hypertension On Lisinopril Gout,  Conitnue Allopurinal Hypothyroidism,   Repeat TSH  Vascular dementia without behavioral disturbance (H[facility]) MMSE was 29 out of 30 in 10/20 CT scan has  shows generalized atrophy Doing well in AL  Stage 3a chronic kidney disease ([place]) Repeat BMP   Labs/tests ordered: CMP,CBC,TSH

## 2020-06-21 DIAGNOSIS — E039 Hypothyroidism, unspecified: Secondary | ICD-10-CM | POA: Diagnosis not present

## 2020-06-22 LAB — TSH: TSH: 2.34 (ref 0.41–5.90)

## 2020-06-22 LAB — COMPREHENSIVE METABOLIC PANEL
Albumin: 4.1 (ref 3.5–5.0)
Calcium: 9.4 (ref 8.7–10.7)
GFR calc Af Amer: 47
GFR calc non Af Amer: 41
Globulin: 2.5

## 2020-06-22 LAB — CBC: RBC: 4.41 (ref 3.87–5.11)

## 2020-06-22 LAB — HEPATIC FUNCTION PANEL
ALT: 12 (ref 7–35)
AST: 17 (ref 13–35)
Alkaline Phosphatase: 81 (ref 25–125)
Bilirubin, Total: 0.5

## 2020-06-22 LAB — CBC AND DIFFERENTIAL
HCT: 42 (ref 36–46)
Hemoglobin: 13.9 (ref 12.0–16.0)
Platelets: 209 (ref 150–399)
WBC: 8.7

## 2020-06-22 LAB — BASIC METABOLIC PANEL
BUN: 25 — AB (ref 4–21)
CO2: 25 — AB (ref 13–22)
Chloride: 106 (ref 99–108)
Creatinine: 1.2 — AB (ref 0.5–1.1)
Glucose: 69
Potassium: 3.8 (ref 3.4–5.3)
Sodium: 142 (ref 137–147)

## 2020-08-02 ENCOUNTER — Emergency Department (HOSPITAL_COMMUNITY)
Admission: EM | Admit: 2020-08-02 | Discharge: 2020-08-03 | Disposition: A | Discharge status: Home / Self Care | Payer: Medicare Other | Source: Home / Self Care | Attending: Emergency Medicine | Admitting: Emergency Medicine

## 2020-08-02 ENCOUNTER — Encounter: Payer: Self-pay | Admitting: Nurse Practitioner

## 2020-08-02 ENCOUNTER — Other Ambulatory Visit: Payer: Self-pay

## 2020-08-02 ENCOUNTER — Encounter (HOSPITAL_COMMUNITY): Payer: Self-pay

## 2020-08-02 ENCOUNTER — Emergency Department (HOSPITAL_COMMUNITY): Payer: Medicare Other

## 2020-08-02 ENCOUNTER — Non-Acute Institutional Stay: Payer: Medicare Other | Admitting: Nurse Practitioner

## 2020-08-02 ENCOUNTER — Emergency Department (HOSPITAL_COMMUNITY)
Admission: EM | Admit: 2020-08-02 | Discharge: 2020-08-02 | Disposition: A | Discharge status: Home / Self Care | Payer: Medicare Other | Attending: Emergency Medicine | Admitting: Emergency Medicine

## 2020-08-02 DIAGNOSIS — F0390 Unspecified dementia without behavioral disturbance: Secondary | ICD-10-CM | POA: Insufficient documentation

## 2020-08-02 DIAGNOSIS — S0081XA Abrasion of other part of head, initial encounter: Secondary | ICD-10-CM | POA: Insufficient documentation

## 2020-08-02 DIAGNOSIS — R2681 Unsteadiness on feet: Secondary | ICD-10-CM | POA: Diagnosis not present

## 2020-08-02 DIAGNOSIS — M25551 Pain in right hip: Secondary | ICD-10-CM

## 2020-08-02 DIAGNOSIS — Y92199 Unspecified place in other specified residential institution as the place of occurrence of the external cause: Secondary | ICD-10-CM | POA: Diagnosis not present

## 2020-08-02 DIAGNOSIS — W01198A Fall on same level from slipping, tripping and stumbling with subsequent striking against other object, initial encounter: Secondary | ICD-10-CM | POA: Insufficient documentation

## 2020-08-02 DIAGNOSIS — N183 Chronic kidney disease, stage 3 unspecified: Secondary | ICD-10-CM | POA: Insufficient documentation

## 2020-08-02 DIAGNOSIS — I129 Hypertensive chronic kidney disease with stage 1 through stage 4 chronic kidney disease, or unspecified chronic kidney disease: Secondary | ICD-10-CM | POA: Insufficient documentation

## 2020-08-02 DIAGNOSIS — N1831 Chronic kidney disease, stage 3a: Secondary | ICD-10-CM

## 2020-08-02 DIAGNOSIS — Z79899 Other long term (current) drug therapy: Secondary | ICD-10-CM | POA: Insufficient documentation

## 2020-08-02 DIAGNOSIS — R410 Disorientation, unspecified: Secondary | ICD-10-CM | POA: Diagnosis not present

## 2020-08-02 DIAGNOSIS — M549 Dorsalgia, unspecified: Secondary | ICD-10-CM | POA: Insufficient documentation

## 2020-08-02 DIAGNOSIS — R42 Dizziness and giddiness: Secondary | ICD-10-CM | POA: Diagnosis not present

## 2020-08-02 DIAGNOSIS — E039 Hypothyroidism, unspecified: Secondary | ICD-10-CM | POA: Insufficient documentation

## 2020-08-02 DIAGNOSIS — S0101XD Laceration without foreign body of scalp, subsequent encounter: Secondary | ICD-10-CM | POA: Diagnosis not present

## 2020-08-02 DIAGNOSIS — Z8673 Personal history of transient ischemic attack (TIA), and cerebral infarction without residual deficits: Secondary | ICD-10-CM | POA: Insufficient documentation

## 2020-08-02 DIAGNOSIS — S199XXA Unspecified injury of neck, initial encounter: Secondary | ICD-10-CM | POA: Diagnosis not present

## 2020-08-02 DIAGNOSIS — M545 Low back pain, unspecified: Secondary | ICD-10-CM

## 2020-08-02 DIAGNOSIS — I10 Essential (primary) hypertension: Secondary | ICD-10-CM | POA: Insufficient documentation

## 2020-08-02 DIAGNOSIS — M109 Gout, unspecified: Secondary | ICD-10-CM | POA: Diagnosis not present

## 2020-08-02 DIAGNOSIS — M79604 Pain in right leg: Secondary | ICD-10-CM | POA: Diagnosis not present

## 2020-08-02 DIAGNOSIS — F0150 Vascular dementia without behavioral disturbance: Secondary | ICD-10-CM | POA: Diagnosis not present

## 2020-08-02 DIAGNOSIS — W19XXXA Unspecified fall, initial encounter: Secondary | ICD-10-CM

## 2020-08-02 DIAGNOSIS — W1839XA Other fall on same level, initial encounter: Secondary | ICD-10-CM | POA: Insufficient documentation

## 2020-08-02 DIAGNOSIS — R1111 Vomiting without nausea: Secondary | ICD-10-CM | POA: Diagnosis not present

## 2020-08-02 DIAGNOSIS — R29898 Other symptoms and signs involving the musculoskeletal system: Secondary | ICD-10-CM | POA: Insufficient documentation

## 2020-08-02 DIAGNOSIS — S0990XA Unspecified injury of head, initial encounter: Secondary | ICD-10-CM | POA: Diagnosis present

## 2020-08-02 DIAGNOSIS — Y92129 Unspecified place in nursing home as the place of occurrence of the external cause: Secondary | ICD-10-CM | POA: Insufficient documentation

## 2020-08-02 DIAGNOSIS — Z7401 Bed confinement status: Secondary | ICD-10-CM | POA: Diagnosis not present

## 2020-08-02 DIAGNOSIS — R112 Nausea with vomiting, unspecified: Secondary | ICD-10-CM | POA: Diagnosis not present

## 2020-08-02 HISTORY — DX: Unspecified dementia, unspecified severity, without behavioral disturbance, psychotic disturbance, mood disturbance, and anxiety: F03.90

## 2020-08-02 IMAGING — CR DG HIP (WITH OR WITHOUT PELVIS) 2-3V*R*
3 series · 3 of 3 positions shown · non-contrast
Comparison: Pelvic radiograph dated [DATE]

CLINICAL DATA: [AGE] female with fall and right hip pain.

EXAM:
DG HIP (WITH OR WITHOUT PELVIS) 2-3V RIGHT

[x pelvis]
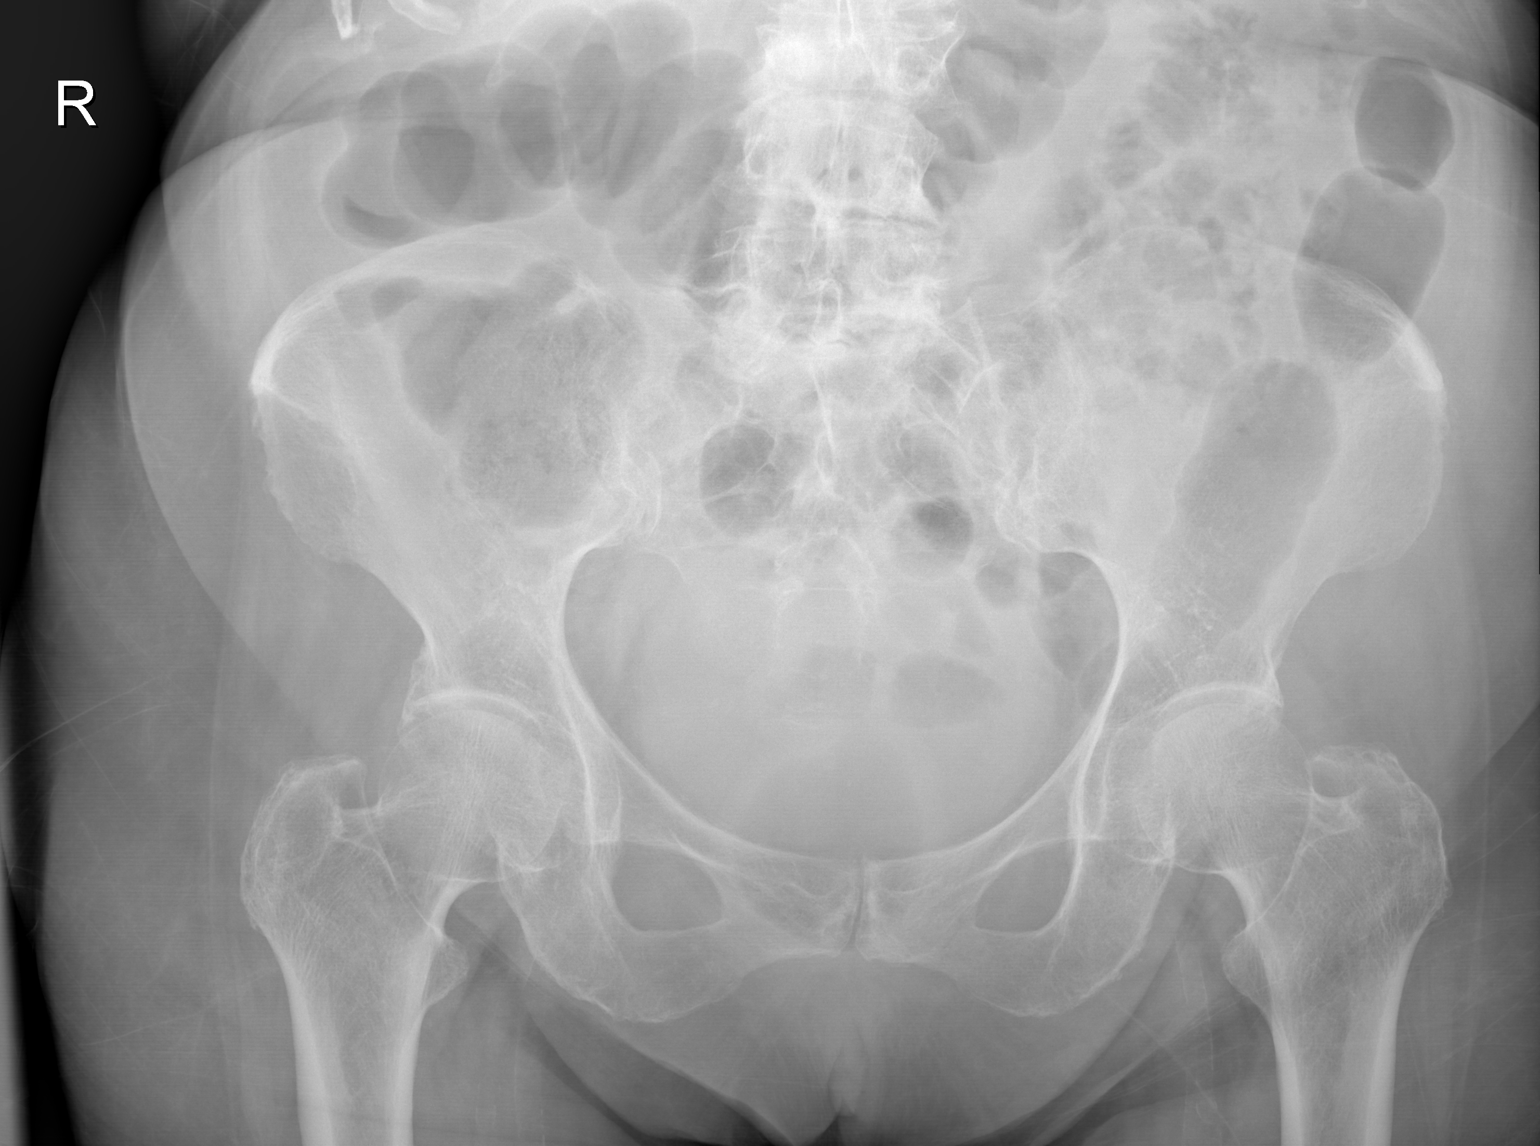

[x hip ap right]
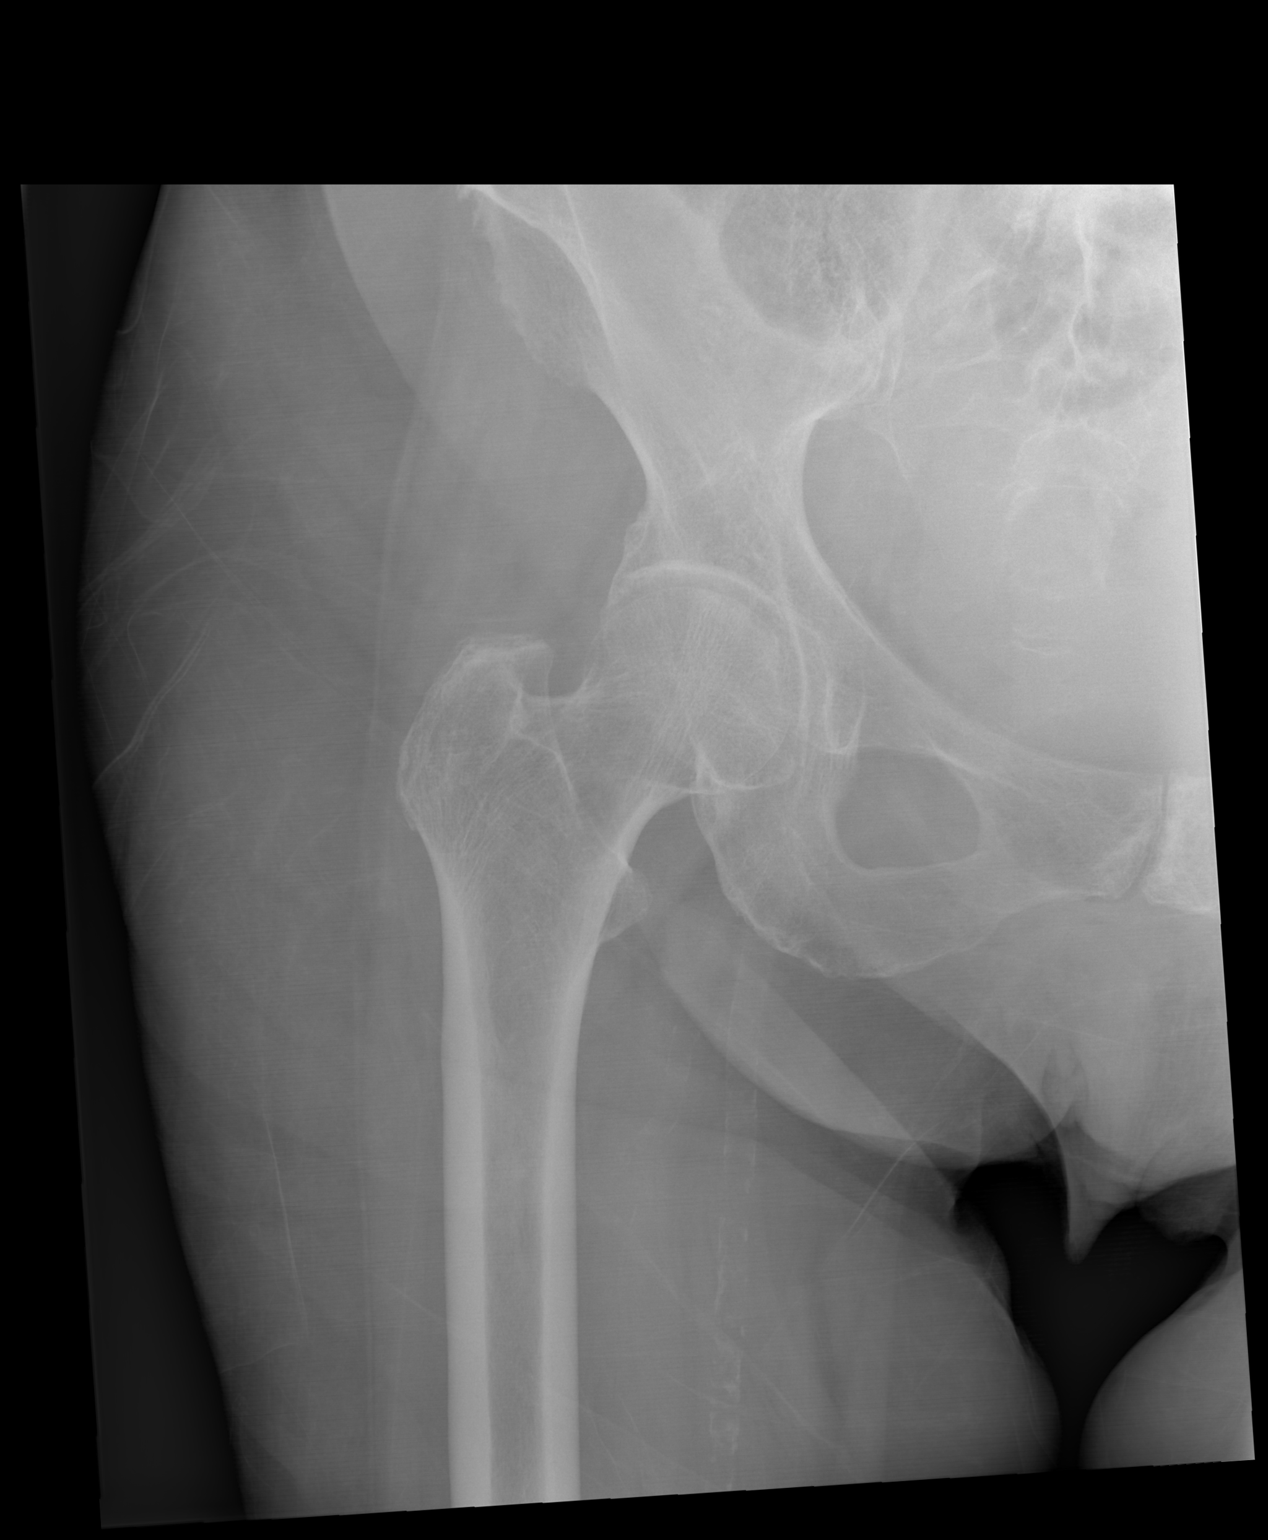

[x hip lat right]
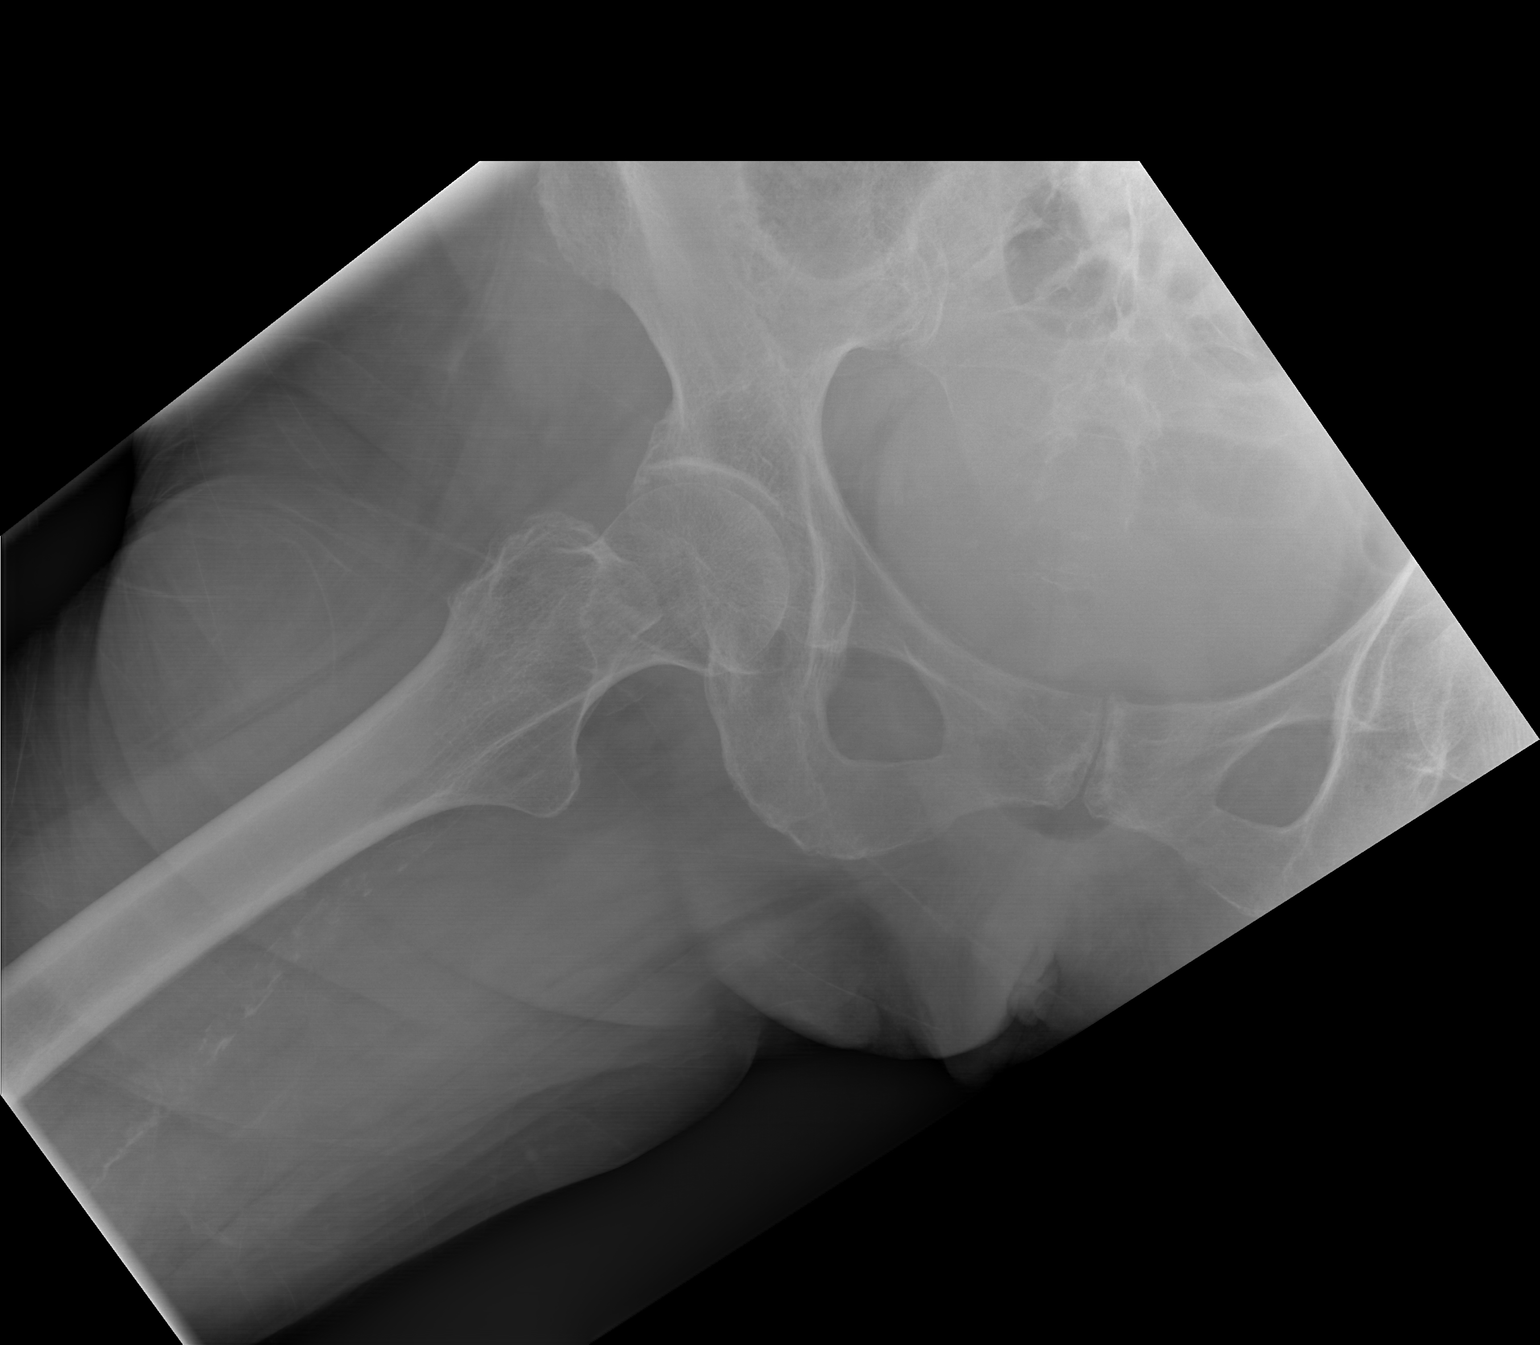

[3 of 3 positions shown; findings below may reference images not displayed]

FINDINGS: There is no acute fracture or dislocation. The bones are osteopenic.
Mild bilateral hip arthritic changes. There is degenerative changes
of the lower lumbar spine. The soft tissues are unremarkable.
Vascular calcifications noted.
IMPRESSION: No acute fracture or dislocation.

## 2020-08-02 IMAGING — CT CT CERVICAL SPINE W/O CM
3 of 4 series · 10 of 33 positions shown, 12 images · non-contrast
Comparison: [DATE]

CLINICAL DATA: Fall, facial trauma, scalp laceration, dizziness

EXAM:
CT HEAD WITHOUT CONTRAST
CT CERVICAL SPINE WITHOUT CONTRAST
TECHNIQUE: Multidetector CT imaging of the head and cervical spine was
performed following the standard protocol without intravenous
contrast. Multiplanar CT image reconstructions of the cervical spine
were also generated.

[Series 6: orthogonal bone · axial · 0.21mm/px · z∈[-230,-177]mm · 2 of 93 slices shown, 3 images]
[im 31/93  soft-tissue]
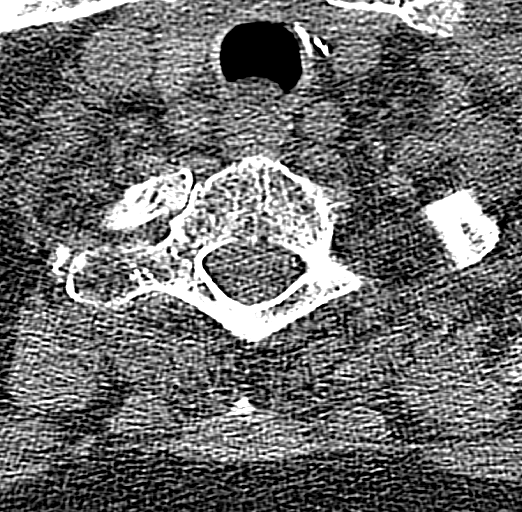
[im 31/93  bone]
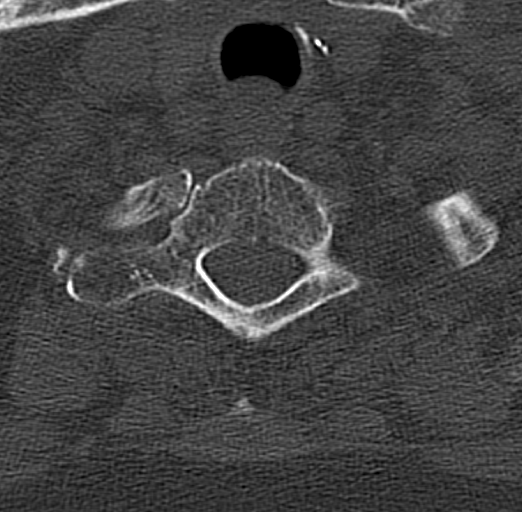
[im 62/93  bone]
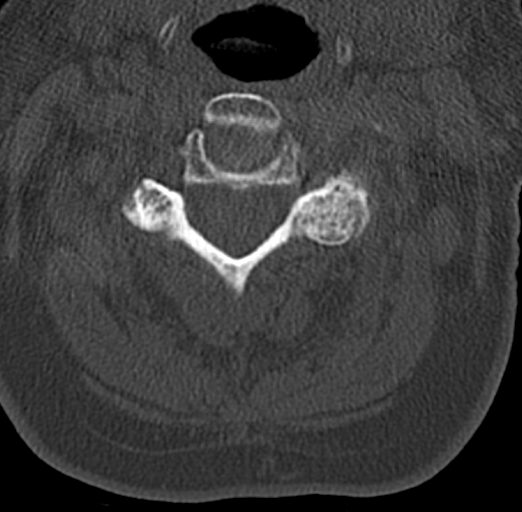

[Series 7: coronal bone · coronal · 0.21mm/px · 3 of 51 slices shown]
[im 12/51  bone]
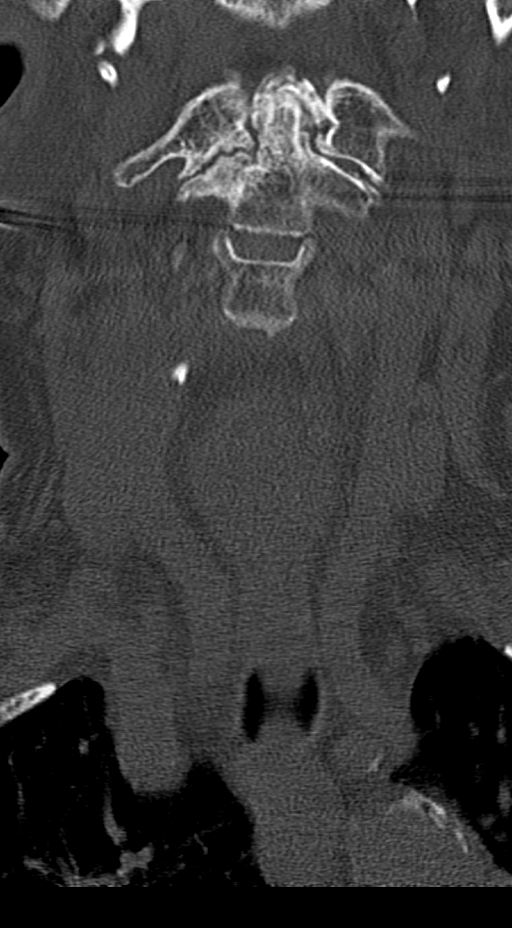
[im 21/51  bone]
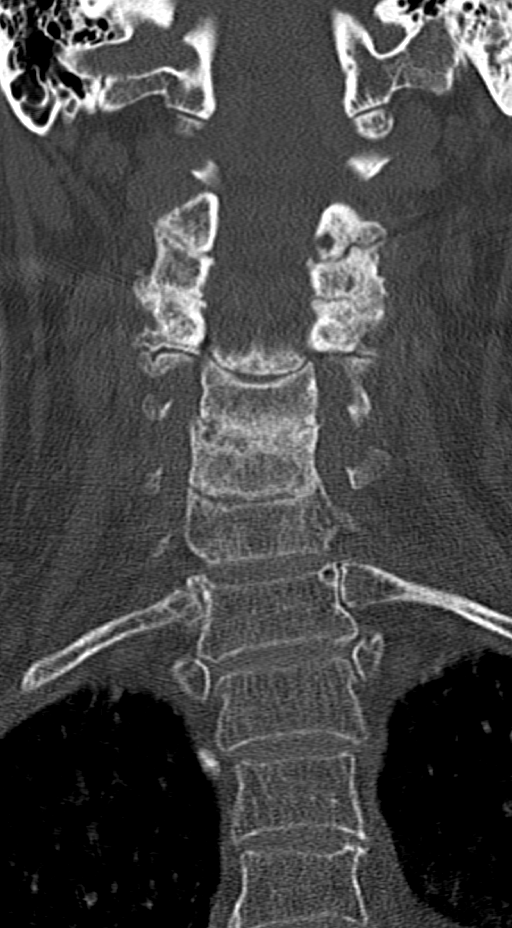
[im 30/51  bone]
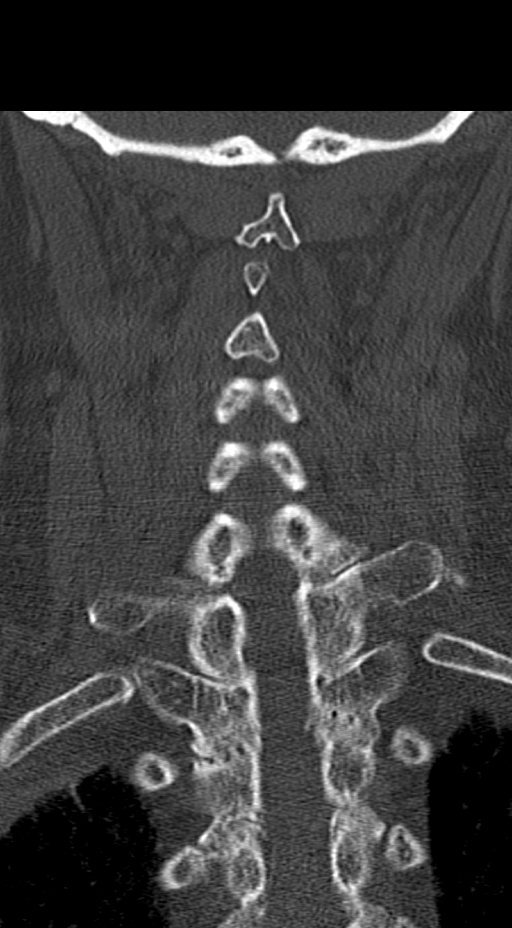

[Series 8: sagittal bone · sagittal · 0.21mm/px · 5 of 51 slices shown, 6 images]
[im 17/51  bone]
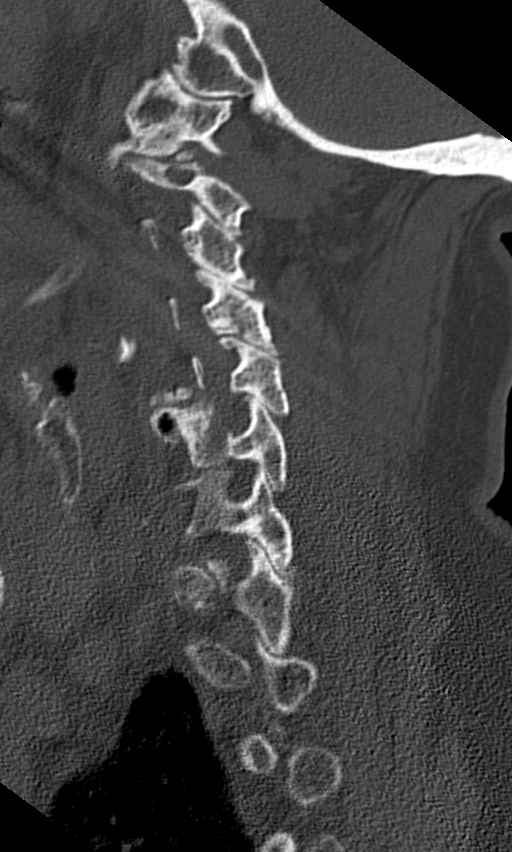
[im 21/51  bone]
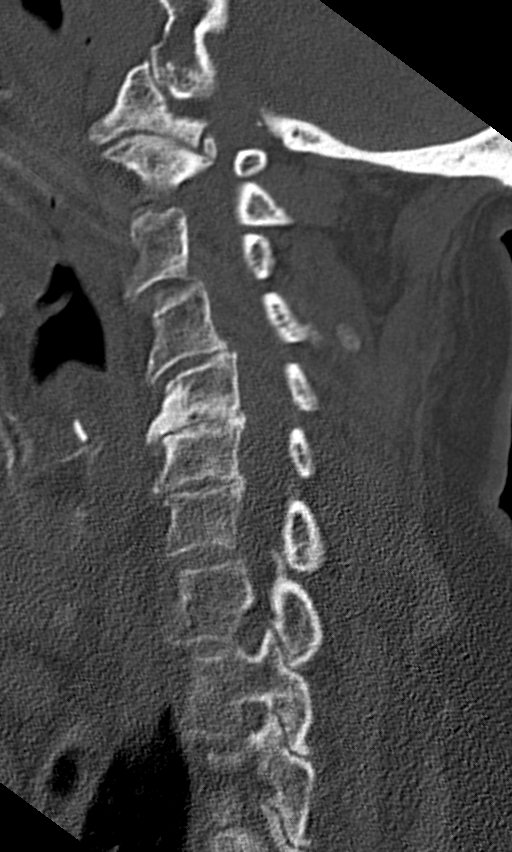
[im 26/51  soft-tissue]
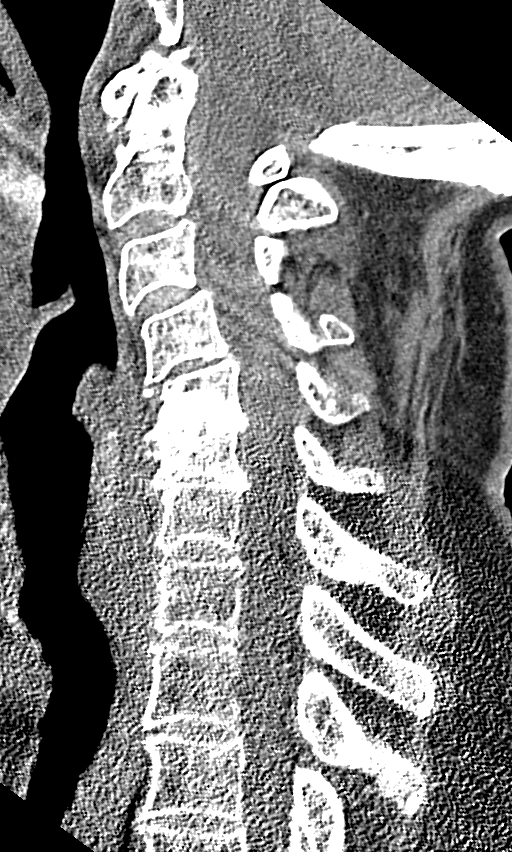
[im 26/51  bone]
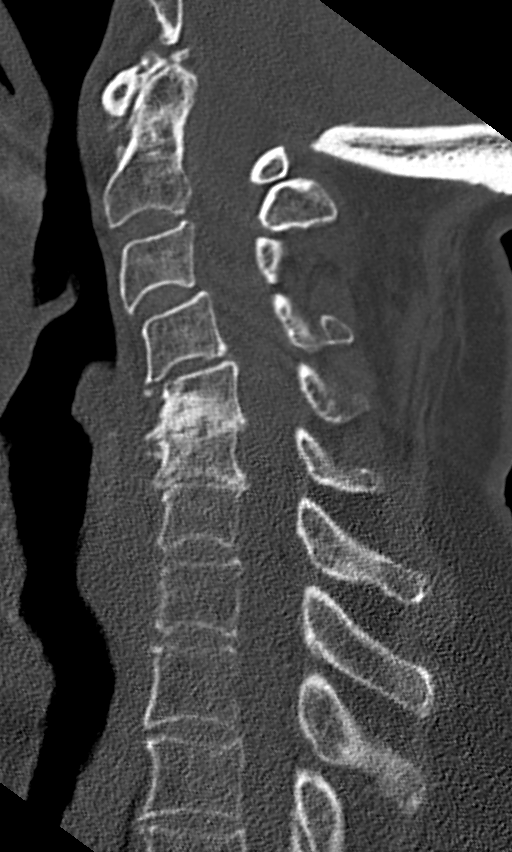
[im 30/51  bone]
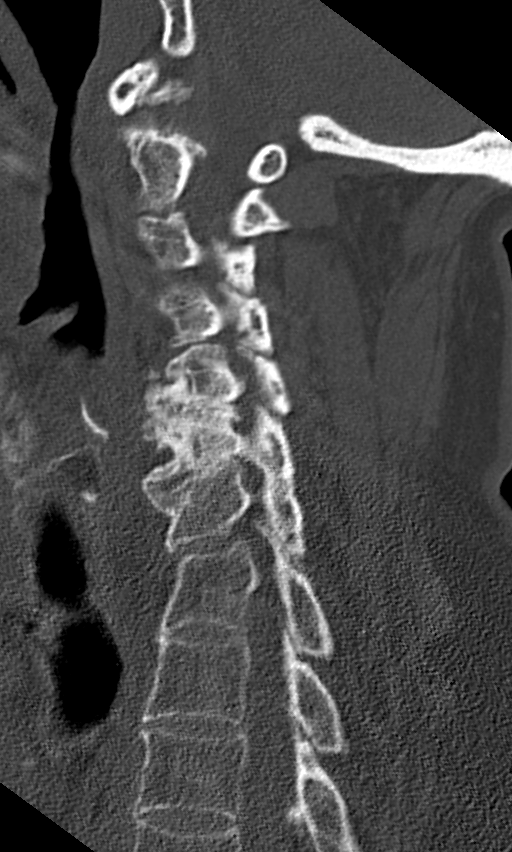
[im 34/51  bone]
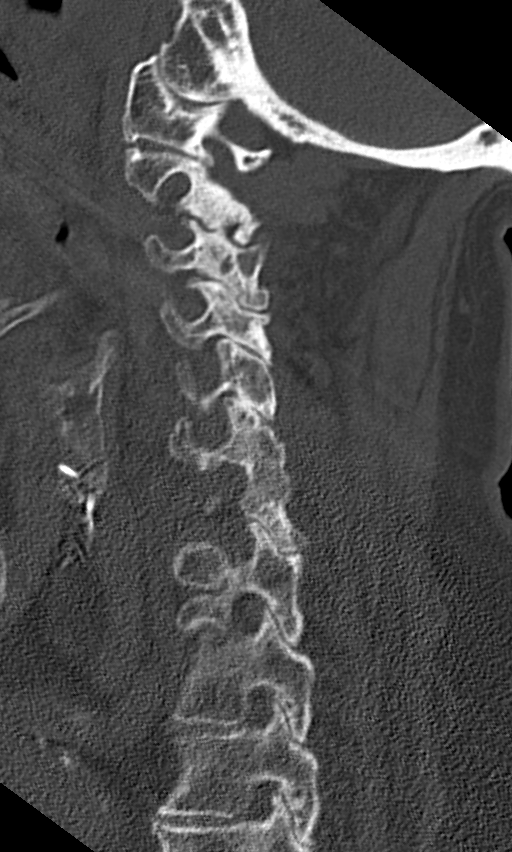

[10 of 33 positions shown; findings below may reference images not displayed]

FINDINGS: CT HEAD FINDINGS

Brain: Remote left parietal, left periventricular, and bilateral
occipital cortical infarcts are identified with interval development
of the periventricular and parietal infarcts since prior
examination. Mild parenchymal volume loss is stable in commensurate
with the patient's age. Mild periventricular white matter changes
are again noted most in keeping with small vessel ischemia.

No evidence of acute intracranial hemorrhage or infarct. No abnormal
mass effect or midline shift. No abnormal intra or extra-axial mass
lesion or fluid collection. Cerebellum is unremarkable. Ventricular
size is normal.

Vascular: No asymmetric hyperdense vasculature at the skull base.

Skull: Intact

Sinuses/Orbits: The visualized paranasal sinuses are clear. Orbits
are unremarkable.

Other: Mastoid air cells and middle ear cavities are clear. Mild
left supraorbital soft tissue swelling.

CT CERVICAL SPINE FINDINGS

Alignment: There is stable physiologic 2-3 mm anterolisthesis of C3
upon C4 and C4 upon C5. Ankylosis of the left C6-7 facet.

Skull base and vertebrae: The craniocervical alignment is normal.
Severe degenerative arthritis involving the a right atlantoaxial
articulation and the atlantodental articulation are again
identified. The atlantodental interval is not widened. There is no
acute fracture or traumatic listhesis of the cervical spine.
Vertebral body heights have been preserved

Soft tissues and spinal canal: No prevertebral fluid or swelling. No
visible canal hematoma.

Disc levels: There is marked intervertebral disc space narrowing and
endplate remodeling at C5-6 and C6-7 in keeping with changes of
severe degenerative disc disease, similar to that noted on prior
examination. Moderate degenerative disc disease is noted at C4-5,
similar to prior exam. Remaining intervertebral disc heights are
preserved. Review of the axial images demonstrates multilevel
uncovertebral and facet arthrosis resulting in multilevel
mild-to-moderate neuroforaminal narrowing, most severe on the right
at C4-5 in on the left at C4-5 and C5-6. No significant canal
stenosis.

Upper chest: Unremarkable

Other: None
IMPRESSION: No acute intracranial injury.  No calvarial fracture.

Remote infarcts within the a occipital cortices bilaterally again
noted. Interval development of remote infarcts within the left
parietal lobe and periventricular white matter.

Stable senescent change.

No acute fracture or listhesis of the cervical spine.

Advanced multilevel degenerative disc and degenerative joint
disease, as described above, resulting in multilevel
mild-to-moderate neuroforaminal narrowing, most severe bilaterally
at C4-5 and on the left at C5-6.

## 2020-08-02 IMAGING — CT CT HEAD W/O CM
3 series · 14 of 46 positions shown, 16 images · non-contrast
Comparison: [DATE]

CLINICAL DATA: Fall, facial trauma, scalp laceration, dizziness

EXAM:
CT HEAD WITHOUT CONTRAST
CT CERVICAL SPINE WITHOUT CONTRAST
TECHNIQUE: Multidetector CT imaging of the head and cervical spine was
performed following the standard protocol without intravenous
contrast. Multiplanar CT image reconstructions of the cervical spine
were also generated.

[Series 3: head wo · axial · 0.47mm/px · z∈[-95,+25]mm · 8 of 29 slices shown, 10 images]
[im 3/29  brain]
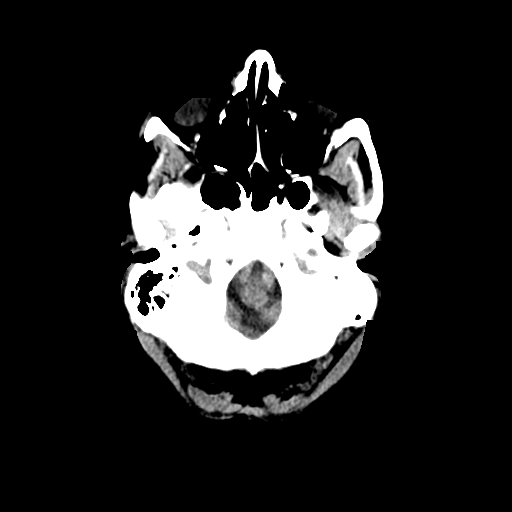
[im 3/29  bone]
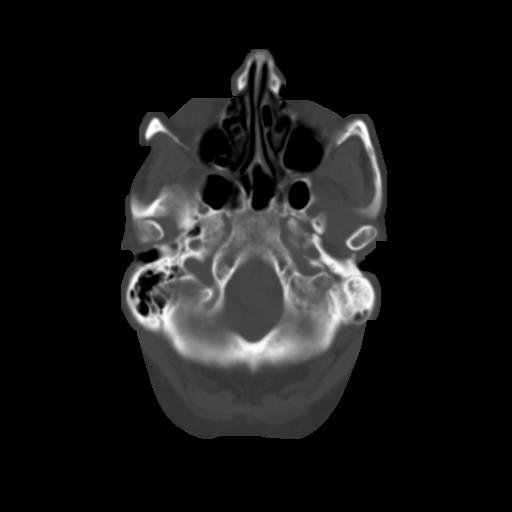
[im 7/29  brain]
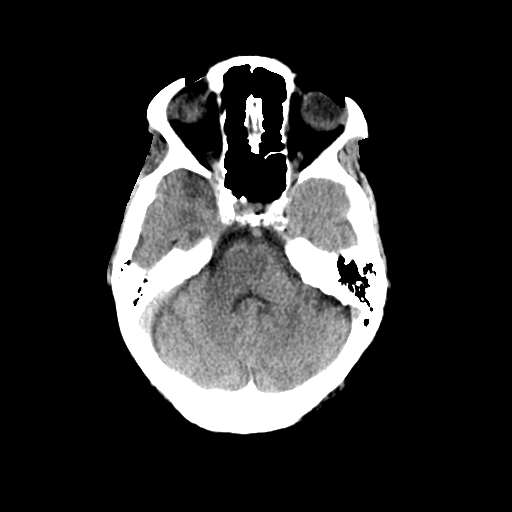
[im 10/29  brain]
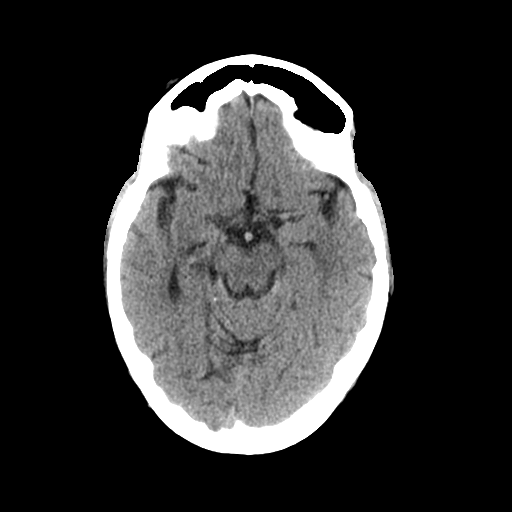
[im 13/29  brain]
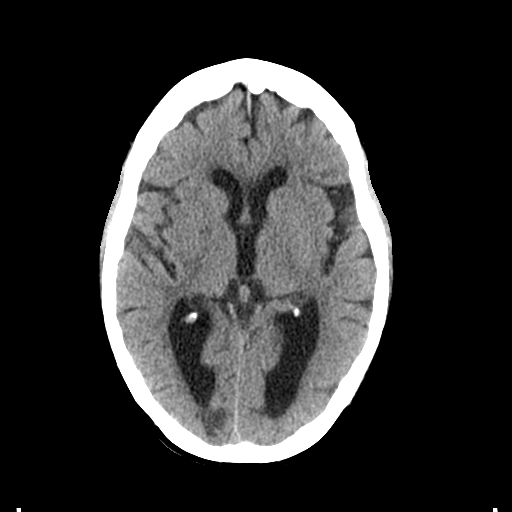
[im 17/29  brain]
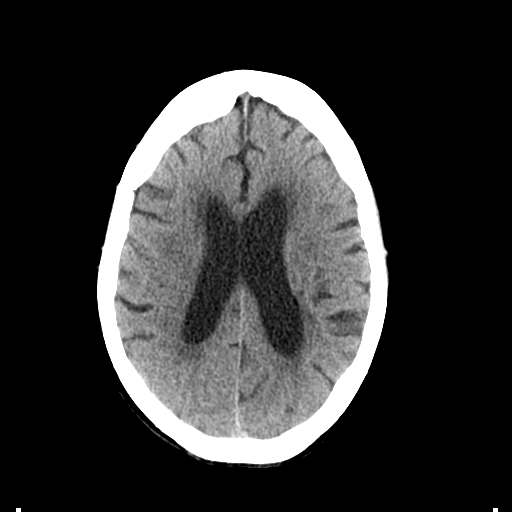
[im 17/29  bone]
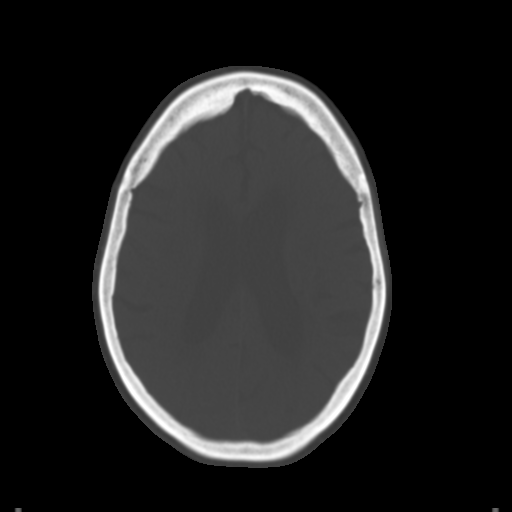
[im 20/29  brain]
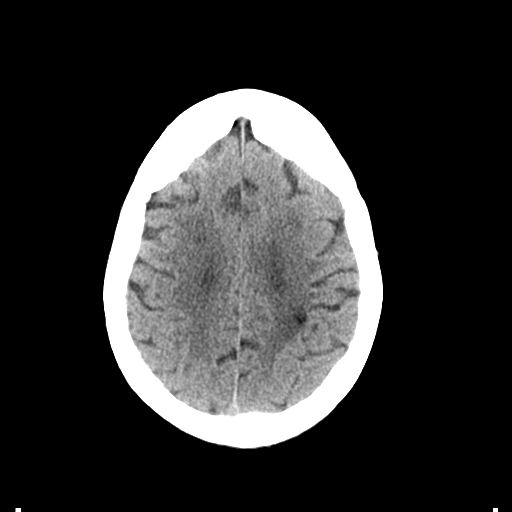
[im 23/29  brain]
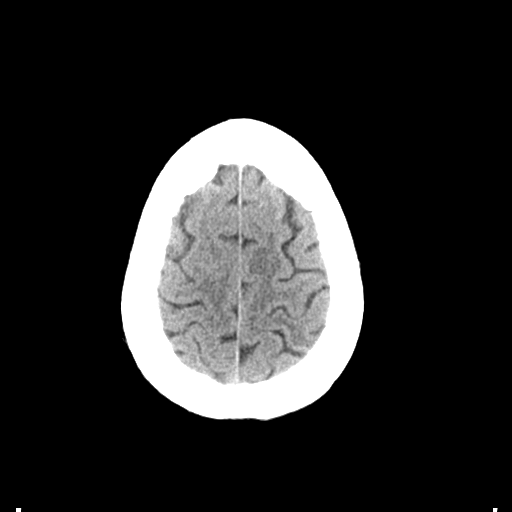
[im 27/29  brain]
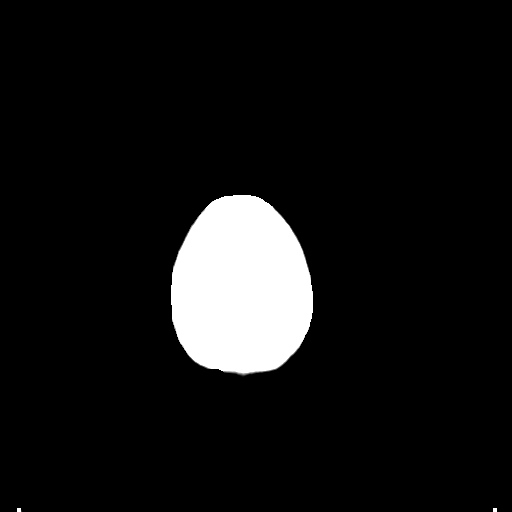

[Series 5: coronal soft tissue · coronal · 0.28mm/px · 3 of 66 slices shown]
[im 22/66  brain]
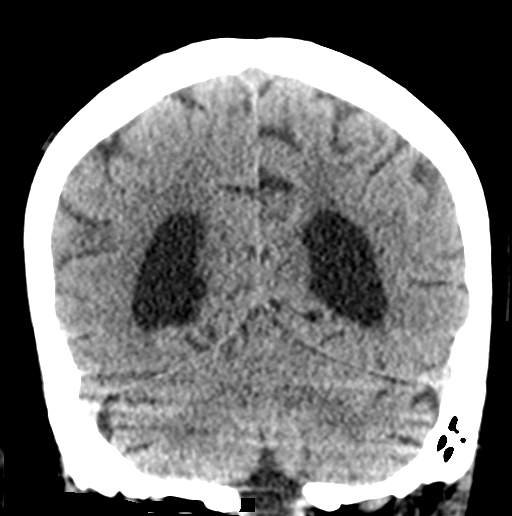
[im 29/66  brain]
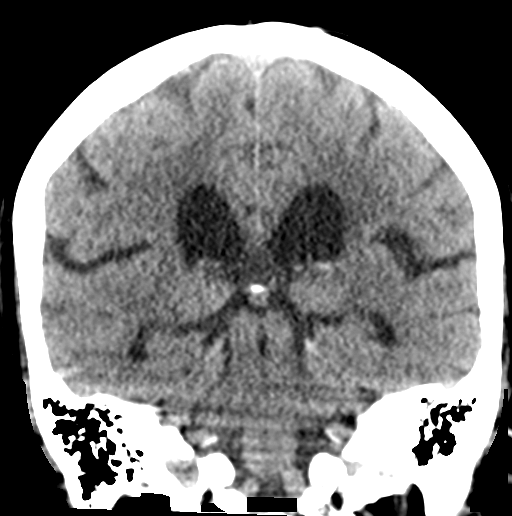
[im 37/66  brain]
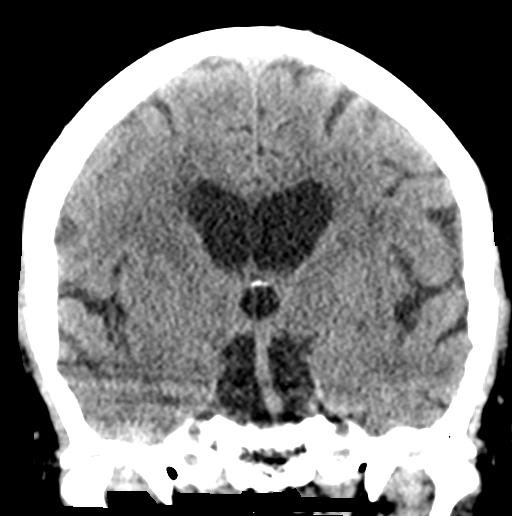

[Series 6: sagittal soft tissue · sagittal · 0.28mm/px · 3 of 49 slices shown]
[im 17/49  brain]
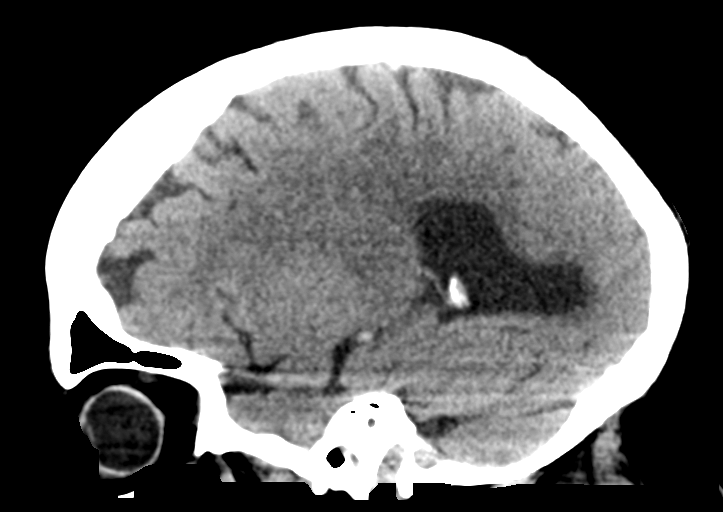
[im 25/49  brain]
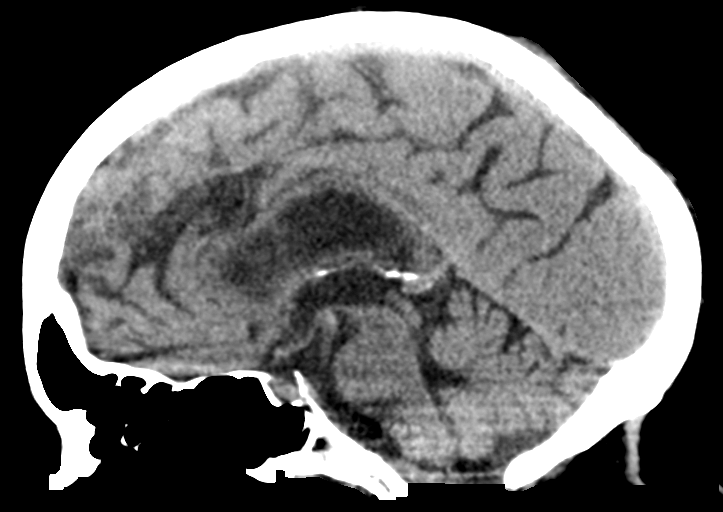
[im 33/49  brain]
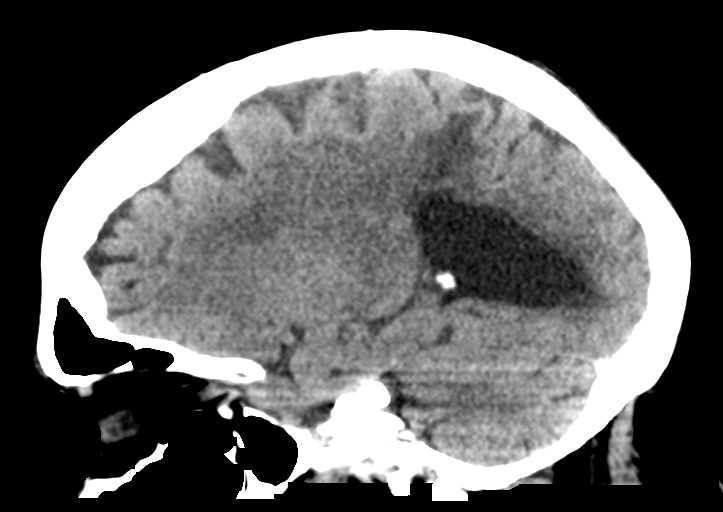

[14 of 46 positions shown; findings below may reference images not displayed]

FINDINGS: CT HEAD FINDINGS

Brain: Remote left parietal, left periventricular, and bilateral
occipital cortical infarcts are identified with interval development
of the periventricular and parietal infarcts since prior
examination. Mild parenchymal volume loss is stable in commensurate
with the patient's age. Mild periventricular white matter changes
are again noted most in keeping with small vessel ischemia.

No evidence of acute intracranial hemorrhage or infarct. No abnormal
mass effect or midline shift. No abnormal intra or extra-axial mass
lesion or fluid collection. Cerebellum is unremarkable. Ventricular
size is normal.

Vascular: No asymmetric hyperdense vasculature at the skull base.

Skull: Intact

Sinuses/Orbits: The visualized paranasal sinuses are clear. Orbits
are unremarkable.

Other: Mastoid air cells and middle ear cavities are clear. Mild
left supraorbital soft tissue swelling.

CT CERVICAL SPINE FINDINGS

Alignment: There is stable physiologic 2-3 mm anterolisthesis of C3
upon C4 and C4 upon C5. Ankylosis of the left C6-7 facet.

Skull base and vertebrae: The craniocervical alignment is normal.
Severe degenerative arthritis involving the a right atlantoaxial
articulation and the atlantodental articulation are again
identified. The atlantodental interval is not widened. There is no
acute fracture or traumatic listhesis of the cervical spine.
Vertebral body heights have been preserved

Soft tissues and spinal canal: No prevertebral fluid or swelling. No
visible canal hematoma.

Disc levels: There is marked intervertebral disc space narrowing and
endplate remodeling at C5-6 and C6-7 in keeping with changes of
severe degenerative disc disease, similar to that noted on prior
examination. Moderate degenerative disc disease is noted at C4-5,
similar to prior exam. Remaining intervertebral disc heights are
preserved. Review of the axial images demonstrates multilevel
uncovertebral and facet arthrosis resulting in multilevel
mild-to-moderate neuroforaminal narrowing, most severe on the right
at C4-5 in on the left at C4-5 and C5-6. No significant canal
stenosis.

Upper chest: Unremarkable

Other: None
IMPRESSION: No acute intracranial injury.  No calvarial fracture.

Remote infarcts within the a occipital cortices bilaterally again
noted. Interval development of remote infarcts within the left
parietal lobe and periventricular white matter.

Stable senescent change.

No acute fracture or listhesis of the cervical spine.

Advanced multilevel degenerative disc and degenerative joint
disease, as described above, resulting in multilevel
mild-to-moderate neuroforaminal narrowing, most severe bilaterally
at C4-5 and on the left at C5-6.

## 2020-08-02 NOTE — Assessment & Plan Note (Signed)
stable, on Allopurinol 100mg  qd.

## 2020-08-02 NOTE — ED Notes (Signed)
[facility] paged for transport 

## 2020-08-02 NOTE — ED Notes (Signed)
[facility] called and updated on patients status.

## 2020-08-02 NOTE — Assessment & Plan Note (Signed)
blood pressure is controlled on Benazepril 10mg  qd. Bun/creat 25/1.2 06/22/20

## 2020-08-02 NOTE — Discharge Instructions (Addendum)
You were evaluated in the Emergency Department and after careful evaluation, we did not find any emergent condition requiring admission or further testing in the hospital.  Your exam/testing today was overall reassuring.  Please return to the Emergency Department if you experience any worsening of your condition.  Thank you for allowing us to be a part of your care.  

## 2020-08-02 NOTE — Assessment & Plan Note (Signed)
Vascular dementia, MMSE dropped from 01/02/19 29/30 to11/3/21 MMSE 18/30, failed clock drawing.07/15/19 CT head Mild generalized parenchymal atrophy and chronic small vessel ischemic disease, tolerated Memantine well.

## 2020-08-02 NOTE — ED Notes (Signed)
[facility] called for transport.  

## 2020-08-02 NOTE — Assessment & Plan Note (Signed)
Right leg muscle strength 3-4/5, no facial or BLE weakness, clear speech, followed simple directions. ED to eval in setting of head trauma and nauseated/vomitted x1 early today.

## 2020-08-02 NOTE — Assessment & Plan Note (Signed)
Her baseline is ambulating with walker slowly. Unable to attempt walking due to the right leg weakness since the fall.

## 2020-08-02 NOTE — Assessment & Plan Note (Signed)
Hx of CVA 08/02/20 CT head Remote infarcts within the a occipital cortices bilaterally again noted. Interval development of remote infarcts within the left parietal lobe and periventricular white matter.

## 2020-08-02 NOTE — ED Triage Notes (Signed)
Patient BIB [facility] from [facility]. Fell at 11pm last night. Laceration on the back of her head. The nurse said patient was favoring her right side and complaining of dizziness. History of dementia. Patient is not on blood thinners.   EMS vitals 153/75 96% Room Air HR 89

## 2020-08-02 NOTE — Assessment & Plan Note (Signed)
x1 earlier today, denied abd pain or nausea upon my examination.

## 2020-08-02 NOTE — ED Provider Notes (Signed)
[facility] Emergency Department Provider Note MRN:  [id number]  Arrival date & time: 08/02/20     Chief Complaint   Fall   History of Present Illness   [name] is a 87 y.o. year-old female with a history of dementia presenting to the ED with chief complaint of fall.  Fall at facility yesterday evening, hit the back of her head.  Long history of unsteadiness while walking.  Patient does not remember what happened and/or cannot give history.  I was unable to obtain an accurate HPI, PMH, or ROS due to the patient's dementia.  Level 5 caveat.  Review of Systems  Positive for head trauma.  Patient's Health History    Past Medical History:  Diagnosis Date  . Cognitive changes   . Dementia ([place])   . Depression   . Gout   . Hypertension   . Hypothyroidism   . Peripheral neuropathy     History reviewed. No pertinent surgical history.  History reviewed. No pertinent family history.  Social History   Socioeconomic History  . Marital status: Widowed    Spouse name: Not on file  . Number of children: Not on file  . Years of education: Not on file  . Highest education level: Not on file  Occupational History  . Not on file  Tobacco Use  . Smoking status: Unknown If Ever Smoked  . Smokeless tobacco: Not on file  Substance and Sexual Activity  . Alcohol use: Not on file  . Drug use: Not on file  . Sexual activity: Not on file  Other Topics Concern  . Not on file  Social History Narrative  . Not on file   Social Determinants of Health   Financial Resource Strain: Not on file  Food Insecurity: Not on file  Transportation Needs: Not on file  Physical Activity: Not on file  Stress: Not on file  Social Connections: Not on file  Intimate Partner Violence: Not on file     Physical Exam   Vitals:   08/02/20 0300 08/02/20 0330  BP: (!) 128/58 121/60  Pulse: 80 75  Resp: 12 14  Temp:    SpO2: 98% 96%    CONSTITUTIONAL: Chronically  ill-appearing, NAD NEURO:  Alert and oriented x 3, no focal deficits EYES:  eyes equal and reactive ENT/NECK:  no LAD, no JVD CARDIO: Regular rate, well-perfused, normal S1 and S2 PULM:  CTAB no wheezing or rhonchi GI/GU:  normal bowel sounds, non-distended, non-tender MSK/SPINE:  No gross deformities, no edema SKIN: Abrasion to the occiput, small, hemostatic PSYCH:  Appropriate speech and behavior  *Additional and/or pertinent findings included in MDM below  Diagnostic and Interventional Summary    EKG Interpretation  Date/Time:  Thursday Aug 02 2020 01:42:23 EDT Ventricular Rate:  86 PR Interval:  140 QRS Duration: 87 QT Interval:  387 QTC Calculation: 463 R Axis:   42 Text Interpretation: Sinus rhythm No significant change was found Confirmed by Gerlene Fee [phone]) on 08/02/2020 3:49:50 AM      Labs Reviewed - No data to display  CT HEAD WO CONTRAST  Final Result    CT CERVICAL SPINE WO CONTRAST  Final Result      Medications - No data to display   Procedures  /  Critical Care Procedures  ED Course and Medical Decision Making  I have reviewed the triage vital signs, the nursing notes, and pertinent available records from the EMR.  Listed above are laboratory and  imaging tests that I personally ordered, reviewed, and interpreted and then considered in my medical decision making (see below for details).  CT to evaluate for intracranial bleeding or cervical spinal injury.  Moves all extremities, converses, very hard of hearing.  Overall suspecting a mechanical fall, reported of dizziness though patient denies this.  Obtaining screening EKG.  Finding little benefit of laboratory assessment this evening, will hold off for now.     CT imaging is reassuring, EKG is without significant changes or findings.  Appropriate for discharge.  Attempted to reach out to patient's son, no answer.  Barth Kirks. Sedonia Small, MD [place] [email or web address]  Final Clinical Impressions(s) / ED Diagnoses     ICD-10-CM   1. Fall, initial encounter  W19.[facility]     ED Discharge Orders    None       Discharge Instructions Discussed with and Provided to Patient:     Discharge Instructions     You were evaluated in the Emergency Department and after careful evaluation, we did not find any emergent condition requiring admission or further testing in the hospital.  Your exam/testing today was overall reassuring.  Please return to the Emergency Department if you experience any worsening of your condition.  Thank you for allowing [place] to be a part of your care.        Maudie Flakes, MD 08/02/20 [phone]

## 2020-08-02 NOTE — Assessment & Plan Note (Signed)
stable, on Levothyroxine. TSH 2.34 06/22/20

## 2020-08-02 NOTE — Assessment & Plan Note (Addendum)
New since the fall, ED to eval/tx.  08/03/20 X-Studzinski L spine, moderate compression fx of L3, uncertain age

## 2020-08-02 NOTE — ED Notes (Signed)
[name], [phone].

## 2020-08-02 NOTE — ED Triage Notes (Signed)
Pt presents to the ED via EMS from her assisted living facility for right hip pain. Pt was seen earlier today in the ED for same after a fall, which occurred at midnight this morning. The pt was d/c'd from the ED, continued to have pain to the right hip and the pt's facility called EMS to have the pt transported again.

## 2020-08-02 NOTE — Assessment & Plan Note (Signed)
The patient doesn't remember how she feel.

## 2020-08-02 NOTE — Assessment & Plan Note (Signed)
Bun/creat 25/1.2 eGFR 41 06/22/20

## 2020-08-02 NOTE — Progress Notes (Addendum)
Location:   [facility] Room Number: [phone] Place of Service:  ALF (13) Provider:  Kelin Borum Otho Darner, NP    Patient Care Team: Virgie Dad, MD as PCP - General (Internal Medicine) Paola Aleshire X, NP as Nurse Practitioner (Internal Medicine) Virgie Dad, MD as Consulting Physician (Internal Medicine)  Extended Emergency Contact Information Primary Emergency Contact: Betley,[name]  Johnnette Litter of [place] Phone: [phone] Relation: Son  Code Status:  FULL CODE Goals of care: Advanced Directive information Advanced Directives 08/02/2020  Does Patient Have a Medical Advance Directive? Yes  Type of Advance Directive Out of facility DNR (pink MOST or yellow form)  Does patient want to make changes to medical advance directive? No - Guardian declined  Would patient like information on creating a medical advance directive? No - Patient declined  Pre-existing out of facility DNR order (yellow form or pink MOST form) -     Chief Complaint  Patient presents with  . Acute Visit    Patient complains of weakness in right leg.     HPI:   Pt is a 87 y.o. female seen today for an acute visit for s/p ED eval for fall 08/02/20 when the patient was found on the floor, resulted a small scalp laceration on the occiput, c/o headache, dizziness when the patient was assisted to bed, also c/o lower back pain, unable to bear weight on the right leg. CT head/cervical spine showed no acute process, EKG SR vent rate 86  Hx of CVA 08/02/20 CT head Remote infarcts within the a occipital cortices bilaterally again noted. Interval development of remote infarcts within the left parietal lobe and periventricular white matter.  Vascular dementia, MMSE dropped from 01/02/19 29/30 to11/3/21 MMSE 18/30, failed clock drawing.07/15/19 CT head Mild generalized parenchymal atrophy and chronic small vessel ischemic disease, tolerated Memantine well.               HTN, blood pressure is controlled  on Benazepril 34m qd. Bun/creat 25/1.2 06/22/20  CKD Bun/creat 25/1.2 eGFR 41 06/22/20             Gout stable, on Allopurinol 1054mqd.             Hypothyroidism, stable, on Levothyroxine. TSH 2.34 06/22/20    Past Medical History:  Diagnosis Date  . Cognitive changes   . Dementia ([place]  . Depression   . Gout   . Hypertension   . Hypothyroidism   . Peripheral neuropathy    History reviewed. No pertinent surgical history.  Allergies  Allergen Reactions  . Actonel [Risedronate Sodium]   . Hct [Hydrochlorothiazide]   . Lipitor [Atorvastatin Calcium]   . Neomycin Other (See Comments)    Unknown "been so long ago, a Dermatologist told me"  . Polysporin [Bacitracin-Polymyxin B]   . Prolia [Denosumab]   . Sulfa Antibiotics   . Zocor [Simvastatin]     Allergies as of 08/02/2020      Reactions   Actonel [risedronate Sodium]    Hct [hydrochlorothiazide]    Lipitor [atorvastatin Calcium]    Neomycin Other (See Comments)   Unknown "been so long ago, a Dermatologist told me"   Polysporin [bacitracin-polymyxin B]    Prolia [denosumab]    Sulfa Antibiotics    Zocor [simvastatin]       Medication List       Accurate as of Aug 02, 2020 11:59 PM. If you have any questions, ask your nurse or doctor.  allopurinol 100 MG tablet Commonly known as: ZYLOPRIM Take 100 mg by mouth daily.   benazepril 10 MG tablet Commonly known as: LOTENSIN Take 10 mg by mouth daily.   Calcium Carb-Cholecalciferol 500-400 MG-UNIT Tabs Take 2 tablets by mouth daily.   CoQ10 100 MG Caps Take 2 capsules by mouth daily.   levothyroxine 100 MCG tablet Commonly known as: SYNTHROID Take 100 mcg by mouth once a week. On Wednesday   levothyroxine 50 MCG tablet Commonly known as: SYNTHROID Take 50 mcg by mouth daily before breakfast. Sun,Mon,Tue, Thur,Fri,Sat   memantine 10 MG tablet Commonly known as: NAMENDA Take 10 mg by mouth 2 (two) times daily.   Omega-3  1000 MG Caps Take 2 capsules by mouth 2 (two) times daily.   Vitamin D3 50 MCG (2000 UT) capsule Take 2,000 Units by mouth daily.       Review of Systems  Constitutional: Positive for activity change, appetite change and fatigue. Negative for fever.  HENT: Positive for hearing loss. Negative for congestion and voice change.   Eyes: Negative for visual disturbance.  Respiratory: Negative for cough and shortness of breath.   Cardiovascular: Negative for leg swelling.  Gastrointestinal: Positive for nausea and vomiting. Negative for abdominal pain and constipation.  Genitourinary: Negative for difficulty urinating, dysuria and urgency.  Musculoskeletal: Positive for back pain and gait problem.  Skin: Positive for wound. Negative for color change.  Neurological: Positive for weakness and light-headedness. Negative for speech difficulty and headaches.       Memory lapses. When the patient was assisted from floor to bed. Right leg weakness.   Psychiatric/Behavioral: Negative for behavioral problems and sleep disturbance. The patient is not nervous/anxious.     Immunization History  Administered Date(s) Administered  . Influenza, High Dose Seasonal PF 12/11/2018  . Influenza-Unspecified 03/24/2018, 12/21/2019  . Moderna Sars-Covid-2 Vaccination 03/15/2019, 04/21/2019, 01/17/2020  . Tdap 07/15/2019   Pertinent  Health Maintenance Due  Topic Date Due  . DEXA SCAN  Never done  . PNA vac Low Risk Adult (1 of 2 - PCV13) Never done  . INFLUENZA VACCINE  10/08/2020   No flowsheet data found. Functional Status Survey:    Vitals:   08/02/20 1116  BP: (!) 116/98  Pulse: (!) 110  Resp: 20  Temp: (!) 97 F (36.1 C)  SpO2: 95%  Weight: 146 lb 12.8 oz (66.6 kg)  Height: '5\' 1"'  (1.549 m)   Body mass index is 27.74 kg/m. Physical Exam Vitals and nursing note reviewed.  Constitutional:      Comments: lethargic  HENT:     Head: Normocephalic and atraumatic.     Nose: Nose normal.      Mouth/Throat:     Mouth: Mucous membranes are dry.  Eyes:     Extraocular Movements: Extraocular movements intact.     Conjunctiva/sclera: Conjunctivae normal.     Pupils: Pupils are equal, round, and reactive to light.  Cardiovascular:     Rate and Rhythm: Normal rate and regular rhythm.     Heart sounds: No murmur heard.   Pulmonary:     Breath sounds: No rales.  Abdominal:     General: Bowel sounds are normal. There is no distension.     Palpations: Abdomen is soft.     Tenderness: There is no abdominal tenderness. There is no right CVA tenderness, left CVA tenderness, guarding or rebound.  Musculoskeletal:        General: Tenderness present.     Cervical back: Normal  range of motion and neck supple.     Right lower leg: No edema.     Left lower leg: No edema.     Comments: Lower back pain complained but no spinal spinous process tenderness palpated.   Skin:    General: Skin is warm and dry.     Comments: Occiput scalp laceration is well approximated, no s/s of bleeding or infection.   Neurological:     General: No focal deficit present.     Mental Status: She is alert. Mental status is at baseline.     Motor: Weakness present.     Gait: Gait abnormal.     Comments: Muscle strength 3-4/5 right leg.   Psychiatric:        Mood and Affect: Mood normal.        Behavior: Behavior normal.     Labs reviewed: Recent Labs    12/28/19 0000 01/11/20 0000 06/22/20 0011  NA 141 140 142  K 4.1 4.1 3.8  CL 107 106 106  CO2 25* 25* 25*  BUN 23* 23* 25*  CREATININE 1.0 1.2* 1.2*  CALCIUM 9.1 9.4 9.4   Recent Labs    12/28/19 0000 01/11/20 0000 06/22/20 0011  AST '16 17 17  ' ALT '12 10 12  ' ALKPHOS 84 78 81  ALBUMIN 3.7 3.7 4.1   Recent Labs    12/28/19 0000 01/11/20 0000 06/22/20 0011  WBC 8.4 7.8 8.7  NEUTROABS 6,065.00  --   --   HGB 13.4 13.2 13.9  HCT 40 39 42  PLT 220 214 209   Lab Results  Component Value Date   TSH 2.34 06/22/2020   No results found  for: HGBA1C No results found for: CHOL, HDL, LDLCALC, LDLDIRECT, TRIG, CHOLHDL  Significant Diagnostic Results in last 30 days:  CT HEAD WO CONTRAST  Result Date: 08/02/2020 CLINICAL DATA:  Fall, facial trauma, scalp laceration, dizziness EXAM: CT HEAD WITHOUT CONTRAST CT CERVICAL SPINE WITHOUT CONTRAST TECHNIQUE: Multidetector CT imaging of the head and cervical spine was performed following the standard protocol without intravenous contrast. Multiplanar CT image reconstructions of the cervical spine were also generated. COMPARISON:  07/15/2019 FINDINGS: CT HEAD FINDINGS Brain: Remote left parietal, left periventricular, and bilateral occipital cortical infarcts are identified with interval development of the periventricular and parietal infarcts since prior examination. Mild parenchymal volume loss is stable in commensurate with the patient's age. Mild periventricular white matter changes are again noted most in keeping with small vessel ischemia. No evidence of acute intracranial hemorrhage or infarct. No abnormal mass effect or midline shift. No abnormal intra or extra-axial mass lesion or fluid collection. Cerebellum is unremarkable. Ventricular size is normal. Vascular: No asymmetric hyperdense vasculature at the skull base. Skull: Intact Sinuses/Orbits: The visualized paranasal sinuses are clear. Orbits are unremarkable. Other: Mastoid air cells and middle ear cavities are clear. Mild left supraorbital soft tissue swelling. CT CERVICAL SPINE FINDINGS Alignment: There is stable physiologic 2-3 mm anterolisthesis of C3 upon C4 and C4 upon C5. Ankylosis of the left C6-7 facet. Skull base and vertebrae: The craniocervical alignment is normal. Severe degenerative arthritis involving the a right atlantoaxial articulation and the atlantodental articulation are again identified. The atlantodental interval is not widened. There is no acute fracture or traumatic listhesis of the cervical spine. Vertebral body  heights have been preserved Soft tissues and spinal canal: No prevertebral fluid or swelling. No visible canal hematoma. Disc levels: There is marked intervertebral disc space narrowing and endplate remodeling at X5-2  and C6-7 in keeping with changes of severe degenerative disc disease, similar to that noted on prior examination. Moderate degenerative disc disease is noted at C4-5, similar to prior exam. Remaining intervertebral disc heights are preserved. Review of the axial images demonstrates multilevel uncovertebral and facet arthrosis resulting in multilevel mild-to-moderate neuroforaminal narrowing, most severe on the right at C4-5 in on the left at C4-5 and C5-6. No significant canal stenosis. Upper chest: Unremarkable Other: None IMPRESSION: No acute intracranial injury.  No calvarial fracture. Remote infarcts within the a occipital cortices bilaterally again noted. Interval development of remote infarcts within the left parietal lobe and periventricular white matter. Stable senescent change. No acute fracture or listhesis of the cervical spine. Advanced multilevel degenerative disc and degenerative joint disease, as described above, resulting in multilevel mild-to-moderate neuroforaminal narrowing, most severe bilaterally at C4-5 and on the left at C5-6. Electronically Signed   By: Fidela Salisbury MD   On: 08/02/2020 03:01   CT CERVICAL SPINE WO CONTRAST  Result Date: 08/02/2020 CLINICAL DATA:  Fall, facial trauma, scalp laceration, dizziness EXAM: CT HEAD WITHOUT CONTRAST CT CERVICAL SPINE WITHOUT CONTRAST TECHNIQUE: Multidetector CT imaging of the head and cervical spine was performed following the standard protocol without intravenous contrast. Multiplanar CT image reconstructions of the cervical spine were also generated. COMPARISON:  07/15/2019 FINDINGS: CT HEAD FINDINGS Brain: Remote left parietal, left periventricular, and bilateral occipital cortical infarcts are identified with interval  development of the periventricular and parietal infarcts since prior examination. Mild parenchymal volume loss is stable in commensurate with the patient's age. Mild periventricular white matter changes are again noted most in keeping with small vessel ischemia. No evidence of acute intracranial hemorrhage or infarct. No abnormal mass effect or midline shift. No abnormal intra or extra-axial mass lesion or fluid collection. Cerebellum is unremarkable. Ventricular size is normal. Vascular: No asymmetric hyperdense vasculature at the skull base. Skull: Intact Sinuses/Orbits: The visualized paranasal sinuses are clear. Orbits are unremarkable. Other: Mastoid air cells and middle ear cavities are clear. Mild left supraorbital soft tissue swelling. CT CERVICAL SPINE FINDINGS Alignment: There is stable physiologic 2-3 mm anterolisthesis of C3 upon C4 and C4 upon C5. Ankylosis of the left C6-7 facet. Skull base and vertebrae: The craniocervical alignment is normal. Severe degenerative arthritis involving the a right atlantoaxial articulation and the atlantodental articulation are again identified. The atlantodental interval is not widened. There is no acute fracture or traumatic listhesis of the cervical spine. Vertebral body heights have been preserved Soft tissues and spinal canal: No prevertebral fluid or swelling. No visible canal hematoma. Disc levels: There is marked intervertebral disc space narrowing and endplate remodeling at X3-8 and C6-7 in keeping with changes of severe degenerative disc disease, similar to that noted on prior examination. Moderate degenerative disc disease is noted at C4-5, similar to prior exam. Remaining intervertebral disc heights are preserved. Review of the axial images demonstrates multilevel uncovertebral and facet arthrosis resulting in multilevel mild-to-moderate neuroforaminal narrowing, most severe on the right at C4-5 in on the left at C4-5 and C5-6. No significant canal stenosis.  Upper chest: Unremarkable Other: None IMPRESSION: No acute intracranial injury.  No calvarial fracture. Remote infarcts within the a occipital cortices bilaterally again noted. Interval development of remote infarcts within the left parietal lobe and periventricular white matter. Stable senescent change. No acute fracture or listhesis of the cervical spine. Advanced multilevel degenerative disc and degenerative joint disease, as described above, resulting in multilevel mild-to-moderate neuroforaminal narrowing, most severe bilaterally at  C4-5 and on the left at C5-6. Electronically Signed   By: Fidela Salisbury MD   On: 08/02/2020 03:01   DG Hip Unilat With Pelvis 2-3 Views Right  Result Date: 08/02/2020 CLINICAL DATA:  87 year old female with fall and right hip pain. EXAM: DG HIP (WITH OR WITHOUT PELVIS) 2-3V RIGHT COMPARISON:  Pelvic radiograph dated 07/15/2019 FINDINGS: There is no acute fracture or dislocation. The bones are osteopenic. Mild bilateral hip arthritic changes. There is degenerative changes of the lower lumbar spine. The soft tissues are unremarkable. Vascular calcifications noted. IMPRESSION: No acute fracture or dislocation. Electronically Signed   By: Anner Crete M.D.   On: 08/02/2020 17:16    Assessment/Plan Right leg weakness Right leg muscle strength 3-4/5, no facial or BLE weakness, clear speech, followed simple directions. ED to eval in setting of head trauma and nauseated/vomitted x1 early today.    Lower back pain New since the fall, ED to eval/tx.  08/03/20 X-Bodie L spine, moderate compression fx of L3, uncertain age  Laceration of occipital region of scalp without complication About a half inch scalp laceration is well approximated, no s/s of bleeding or infection.   Nausea & vomiting x1 earlier today, denied abd pain or nausea upon my examination.   History of CVA (cerebrovascular accident) Hx of CVA 08/02/20 CT head Remote infarcts within the a occipital cortices  bilaterally again noted. Interval development of remote infarcts within the left parietal lobe and periventricular white matter.  Gait instability Her baseline is ambulating with walker slowly. Unable to attempt walking due to the right leg weakness since the fall.   Fall The patient doesn't remember how she feel.   Vascular dementia [facility]) Vascular dementia, MMSE dropped from 01/02/19 29/30 to11/3/21 MMSE 18/30, failed clock drawing.07/15/19 CT head Mild generalized parenchymal atrophy and chronic small vessel ischemic disease, tolerated Memantine well.   HTN (hypertension) blood pressure is controlled on Benazepril 4m qd. Bun/creat 25/1.2 06/22/20   CKD (chronic kidney disease) stage 3, GFR 30-59 ml/min (HCC) Bun/creat 25/1.2 eGFR 41 06/22/20   Gout stable, on Allopurinol 1029mqd.            Hypothyroidism stable, on Levothyroxine. TSH 2.34 06/22/20      Family/ staff Communication: plan of care reviewed with the patient and charge nurse.   Labs/tests ordered:  none  Time spend 40 minutes.

## 2020-08-02 NOTE — ED Provider Notes (Signed)
[facility] DEPT Provider Note   CSN: [id number] Arrival date & time: 08/02/20  1545     History Chief Complaint  Patient presents with  . Hip Pain    Right hip pain     [name] is a 87 y.o. female.  HPI   Level 5 caveat applies for dementia. Patient presents for right hip pain following a fall last night/this morning. She was seen in the ED earlier today but returns from her assisted living facility for right hip pain. The facility is concerned that her pain is continued and she was not scanned.   Past Medical History:  Diagnosis Date  . Cognitive changes   . Dementia ([place])   . Depression   . Gout   . Hypertension   . Hypothyroidism   . Peripheral neuropathy     Patient Active Problem List   Diagnosis Date Noted  . Fall 01/09/2020  . Vascular dementia ([place]) 01/09/2020  . CKD (chronic kidney disease) stage 3, GFR 30-59 ml/min (HCC) 07/27/2019  . Gait instability 07/19/2019  . Laceration of occipital region of scalp without complication 62/95/2841  . Weight gain 03/16/2019  . Hypothyroidism 10/08/2018  . HTN (hypertension) 10/08/2018  . Gout 10/08/2018  . Peripheral neuropathy 10/08/2018    History reviewed. No pertinent surgical history.   OB History   No obstetric history on file.     History reviewed. No pertinent family history.  Social History   Tobacco Use  . Smoking status: Never Smoker  . Smokeless tobacco: Never Used  Substance Use Topics  . Alcohol use: Never  . Drug use: Never    Home Medications Prior to Admission medications   Medication Sig Start Date End Date Taking? Authorizing Provider  allopurinol (ZYLOPRIM) 100 MG tablet Take 100 mg by mouth daily.    [provider]  benazepril (LOTENSIN) 10 MG tablet Take 10 mg by mouth daily.    [provider]  Calcium Carb-Cholecalciferol 500-400 MG-UNIT TABS Take 2 tablets by mouth daily.     [provider]  Cholecalciferol  (VITAMIN D3) 50 MCG (2000 UT) capsule Take 2,000 Units by mouth daily.     [provider]  Coenzyme Q10 (COQ10) 100 MG CAPS Take 2 capsules by mouth daily.    [provider]  levothyroxine (SYNTHROID) 100 MCG tablet Take 100 mcg by mouth once a week. On Wednesday    [provider]  levothyroxine (SYNTHROID) 50 MCG tablet Take 50 mcg by mouth daily before breakfast. Sun,Mon,Tue, Thur,Fri,Sat    [provider]  memantine (NAMENDA) 10 MG tablet Take 10 mg by mouth 2 (two) times daily. 02/09/20   [provider]  Omega-3 1000 MG CAPS Take 2 capsules by mouth 2 (two) times daily.     [provider]    Allergies    Actonel [risedronate sodium], Hct [hydrochlorothiazide], Lipitor [atorvastatin calcium], Neomycin, Polysporin [bacitracin-polymyxin b], Prolia [denosumab], Sulfa antibiotics, and Zocor [simvastatin]  Review of Systems   Review of Systems  Unable to perform ROS: Dementia    Physical Exam Updated Vital Signs BP (!) 146/68 (BP Location: Left Arm)   Pulse 74   Temp 97.9 F (36.6 C) (Oral)   Resp 18   Ht 5\' 1"  (1.549 m)   Wt 66.6 kg   SpO2 97%   BMI 27.74 kg/m   Physical Exam Vitals and nursing note reviewed. Exam conducted with a chaperone present.  Constitutional:  General: She is not in acute distress.    Appearance: Normal appearance.  HENT:     Head: Normocephalic and atraumatic.  Eyes:     General: No scleral icterus.    Extraocular Movements: Extraocular movements intact.     Pupils: Pupils are equal, round, and reactive to light.  Cardiovascular:     Rate and Rhythm: Normal rate and regular rhythm.     Pulses: Normal pulses.     Comments: Radial, DP, PT pulses 2+. Equal bilaterally  Musculoskeletal:     Comments: Legs are even, same length, not internally or externally rotated. She expresses pain on palpation of the right hip. No obvious deformity or bony tenderness.   Skin:    Coloration: Skin is not  jaundiced.  Neurological:     Mental Status: She is alert. Mental status is at baseline.  Psychiatric:        Mood and Affect: Mood normal.     ED Results / Procedures / Treatments   Labs (all labs ordered are listed, but only abnormal results are displayed) Labs Reviewed - No data to display  EKG None  Radiology CT HEAD WO CONTRAST  Result Date: 08/02/2020 CLINICAL DATA:  Fall, facial trauma, scalp laceration, dizziness EXAM: CT HEAD WITHOUT CONTRAST CT CERVICAL SPINE WITHOUT CONTRAST TECHNIQUE: Multidetector CT imaging of the head and cervical spine was performed following the standard protocol without intravenous contrast. Multiplanar CT image reconstructions of the cervical spine were also generated. COMPARISON:  07/15/2019 FINDINGS: CT HEAD FINDINGS Brain: Remote left parietal, left periventricular, and bilateral occipital cortical infarcts are identified with interval development of the periventricular and parietal infarcts since prior examination. Mild parenchymal volume loss is stable in commensurate with the patient's age. Mild periventricular white matter changes are again noted most in keeping with small vessel ischemia. No evidence of acute intracranial hemorrhage or infarct. No abnormal mass effect or midline shift. No abnormal intra or extra-axial mass lesion or fluid collection. Cerebellum is unremarkable. Ventricular size is normal. Vascular: No asymmetric hyperdense vasculature at the skull base. Skull: Intact Sinuses/Orbits: The visualized paranasal sinuses are clear. Orbits are unremarkable. Other: Mastoid air cells and middle ear cavities are clear. Mild left supraorbital soft tissue swelling. CT CERVICAL SPINE FINDINGS Alignment: There is stable physiologic 2-3 mm anterolisthesis of C3 upon C4 and C4 upon C5. Ankylosis of the left C6-7 facet. Skull base and vertebrae: The craniocervical alignment is normal. Severe degenerative arthritis involving the a right atlantoaxial  articulation and the atlantodental articulation are again identified. The atlantodental interval is not widened. There is no acute fracture or traumatic listhesis of the cervical spine. Vertebral body heights have been preserved Soft tissues and spinal canal: No prevertebral fluid or swelling. No visible canal hematoma. Disc levels: There is marked intervertebral disc space narrowing and endplate remodeling at B8-4 and C6-7 in keeping with changes of severe degenerative disc disease, similar to that noted on prior examination. Moderate degenerative disc disease is noted at C4-5, similar to prior exam. Remaining intervertebral disc heights are preserved. Review of the axial images demonstrates multilevel uncovertebral and facet arthrosis resulting in multilevel mild-to-moderate neuroforaminal narrowing, most severe on the right at C4-5 in on the left at C4-5 and C5-6. No significant canal stenosis. Upper chest: Unremarkable Other: None IMPRESSION: No acute intracranial injury.  No calvarial fracture. Remote infarcts within the a occipital cortices bilaterally again noted. Interval development of remote infarcts within the left parietal lobe and periventricular white matter. Stable senescent change. No  acute fracture or listhesis of the cervical spine. Advanced multilevel degenerative disc and degenerative joint disease, as described above, resulting in multilevel mild-to-moderate neuroforaminal narrowing, most severe bilaterally at C4-5 and on the left at C5-6. Electronically Signed   By: Fidela Salisbury MD   On: 08/02/2020 03:01   CT CERVICAL SPINE WO CONTRAST  Result Date: 08/02/2020 CLINICAL DATA:  Fall, facial trauma, scalp laceration, dizziness EXAM: CT HEAD WITHOUT CONTRAST CT CERVICAL SPINE WITHOUT CONTRAST TECHNIQUE: Multidetector CT imaging of the head and cervical spine was performed following the standard protocol without intravenous contrast. Multiplanar CT image reconstructions of the cervical spine  were also generated. COMPARISON:  07/15/2019 FINDINGS: CT HEAD FINDINGS Brain: Remote left parietal, left periventricular, and bilateral occipital cortical infarcts are identified with interval development of the periventricular and parietal infarcts since prior examination. Mild parenchymal volume loss is stable in commensurate with the patient's age. Mild periventricular white matter changes are again noted most in keeping with small vessel ischemia. No evidence of acute intracranial hemorrhage or infarct. No abnormal mass effect or midline shift. No abnormal intra or extra-axial mass lesion or fluid collection. Cerebellum is unremarkable. Ventricular size is normal. Vascular: No asymmetric hyperdense vasculature at the skull base. Skull: Intact Sinuses/Orbits: The visualized paranasal sinuses are clear. Orbits are unremarkable. Other: Mastoid air cells and middle ear cavities are clear. Mild left supraorbital soft tissue swelling. CT CERVICAL SPINE FINDINGS Alignment: There is stable physiologic 2-3 mm anterolisthesis of C3 upon C4 and C4 upon C5. Ankylosis of the left C6-7 facet. Skull base and vertebrae: The craniocervical alignment is normal. Severe degenerative arthritis involving the a right atlantoaxial articulation and the atlantodental articulation are again identified. The atlantodental interval is not widened. There is no acute fracture or traumatic listhesis of the cervical spine. Vertebral body heights have been preserved Soft tissues and spinal canal: No prevertebral fluid or swelling. No visible canal hematoma. Disc levels: There is marked intervertebral disc space narrowing and endplate remodeling at W5-8 and C6-7 in keeping with changes of severe degenerative disc disease, similar to that noted on prior examination. Moderate degenerative disc disease is noted at C4-5, similar to prior exam. Remaining intervertebral disc heights are preserved. Review of the axial images demonstrates multilevel  uncovertebral and facet arthrosis resulting in multilevel mild-to-moderate neuroforaminal narrowing, most severe on the right at C4-5 in on the left at C4-5 and C5-6. No significant canal stenosis. Upper chest: Unremarkable Other: None IMPRESSION: No acute intracranial injury.  No calvarial fracture. Remote infarcts within the a occipital cortices bilaterally again noted. Interval development of remote infarcts within the left parietal lobe and periventricular white matter. Stable senescent change. No acute fracture or listhesis of the cervical spine. Advanced multilevel degenerative disc and degenerative joint disease, as described above, resulting in multilevel mild-to-moderate neuroforaminal narrowing, most severe bilaterally at C4-5 and on the left at C5-6. Electronically Signed   By: Fidela Salisbury MD   On: 08/02/2020 03:01    Procedures Procedures   Medications Ordered in ED Medications - No data to display  ED Course  I have reviewed the triage vital signs and the nursing notes.  Pertinent labs & imaging results that were available during my care of the patient were reviewed by me and considered in my medical decision making (see chart for details).    MDM Rules/Calculators/A&P                          Patient  presents with right hip pain following a fall. Given she had CT head and neck scan and EKG earlier this morning, do not think these need repeating. She has a long history of unsteadiness walking. Ordered plain film of the right hip to assess for hip fracture. Low suspicion based on PE exam.   Plain films do not show acute fracture. Patient is stable and at her baseline. She is appropriate for discharge.   Final Clinical Impression(s) / ED Diagnoses Final diagnoses:  None    Rx / DC Orders ED Discharge Orders    None       Sherrill Raring, PA-C 08/02/20 1723    Fredia Sorrow, MD 08/02/20 [phone]

## 2020-08-02 NOTE — Assessment & Plan Note (Signed)
About a half inch scalp laceration is well approximated, no s/s of bleeding or infection.

## 2020-08-02 NOTE — ED Notes (Signed)
Spoke to [employer], Therapist, sports and gave report on patient.

## 2020-08-02 NOTE — Discharge Instructions (Signed)
You were seen today as follow up for a fall. The plain films did not show any hip fracture. Continue taking tylenol as needed for pain. Use a walker or wheel chair to move.

## 2020-08-03 ENCOUNTER — Non-Acute Institutional Stay: Payer: Medicare Other | Admitting: Nurse Practitioner

## 2020-08-03 DIAGNOSIS — M109 Gout, unspecified: Secondary | ICD-10-CM

## 2020-08-03 DIAGNOSIS — W19XXXA Unspecified fall, initial encounter: Secondary | ICD-10-CM | POA: Diagnosis not present

## 2020-08-03 DIAGNOSIS — E039 Hypothyroidism, unspecified: Secondary | ICD-10-CM

## 2020-08-03 DIAGNOSIS — N1831 Chronic kidney disease, stage 3a: Secondary | ICD-10-CM | POA: Diagnosis not present

## 2020-08-03 DIAGNOSIS — M25572 Pain in left ankle and joints of left foot: Secondary | ICD-10-CM | POA: Diagnosis not present

## 2020-08-03 DIAGNOSIS — Z8673 Personal history of transient ischemic attack (TIA), and cerebral infarction without residual deficits: Secondary | ICD-10-CM | POA: Insufficient documentation

## 2020-08-03 DIAGNOSIS — R4182 Altered mental status, unspecified: Secondary | ICD-10-CM | POA: Diagnosis not present

## 2020-08-03 DIAGNOSIS — I10 Essential (primary) hypertension: Secondary | ICD-10-CM

## 2020-08-03 DIAGNOSIS — S32030A Wedge compression fracture of third lumbar vertebra, initial encounter for closed fracture: Secondary | ICD-10-CM | POA: Diagnosis not present

## 2020-08-03 DIAGNOSIS — M25551 Pain in right hip: Secondary | ICD-10-CM | POA: Diagnosis not present

## 2020-08-03 DIAGNOSIS — F0150 Vascular dementia without behavioral disturbance: Secondary | ICD-10-CM

## 2020-08-03 DIAGNOSIS — R42 Dizziness and giddiness: Secondary | ICD-10-CM | POA: Diagnosis not present

## 2020-08-03 DIAGNOSIS — R102 Pelvic and perineal pain: Secondary | ICD-10-CM | POA: Diagnosis not present

## 2020-08-03 DIAGNOSIS — G459 Transient cerebral ischemic attack, unspecified: Secondary | ICD-10-CM | POA: Diagnosis not present

## 2020-08-03 DIAGNOSIS — M5136 Other intervertebral disc degeneration, lumbar region: Secondary | ICD-10-CM | POA: Diagnosis not present

## 2020-08-03 DIAGNOSIS — M545 Low back pain, unspecified: Secondary | ICD-10-CM | POA: Diagnosis not present

## 2020-08-03 DIAGNOSIS — M533 Sacrococcygeal disorders, not elsewhere classified: Secondary | ICD-10-CM | POA: Diagnosis not present

## 2020-08-03 DIAGNOSIS — M5030 Other cervical disc degeneration, unspecified cervical region: Secondary | ICD-10-CM | POA: Diagnosis not present

## 2020-08-03 NOTE — Assessment & Plan Note (Signed)
The patient c/o dizziness when she is getting out of bed, denied change of vision, headache, chest pain/pressure, or palpitation, no noted focal weakness or slurred speech today.  Orthostatic Bp check, Meclizine 12.5mg  bid x 72 hours.

## 2020-08-03 NOTE — Assessment & Plan Note (Signed)
Hx of CVA per CT head 08/02/20 Remote infarcts within the a occipital cortices bilaterally again noted. Interval development of remote infarcts within the left parietal lobe and periventricular white matter.

## 2020-08-03 NOTE — Assessment & Plan Note (Signed)
The patient's R leg weakness from yesterday is resolved, CT Head 08/02/20 showed No acute intracranial injury.  No calvarial fracture. Adding ASA 81mg  qd.

## 2020-08-03 NOTE — Assessment & Plan Note (Signed)
stable, on Levothyroxine. TSH 2.34 06/22/20

## 2020-08-03 NOTE — Assessment & Plan Note (Signed)
Bun/creat 26/1.08 eGFR 44 08/02/20 

## 2020-08-03 NOTE — Assessment & Plan Note (Signed)
Vascular dementia,MMSE dropped from 01/02/19 29/30 to11/3/21 MMSE 18/30, failed clock drawing.07/15/19 CT head Mild generalized parenchymal atrophy and chronic small vessel ischemic disease, tolerated Memantine well.

## 2020-08-03 NOTE — Assessment & Plan Note (Signed)
The patient c/o lower back pain with movement, needs assistance with transferring. Will obtain X-Tworek lumbosacral spine, pelvis, schedule Tylenol 650mg  tid with meals, therapy to eval/tx

## 2020-08-03 NOTE — Assessment & Plan Note (Signed)
stable, on Allopurinol 100mg  qd.

## 2020-08-03 NOTE — Assessment & Plan Note (Signed)
blood pressure is controlled on Benazepril 57m qd. Bun/creat26/1.08 eGFR 44 08/02/20

## 2020-08-03 NOTE — Assessment & Plan Note (Signed)
DDD per CT head/cervical spine 08/02/20 Advanced multilevel degenerative disc and degenerative joint disease, as described above, resulting in multilevel mild-to-moderate neuroforaminal narrowing, most severe bilaterally at C4-5 and on the left at C5-6.

## 2020-08-03 NOTE — Progress Notes (Addendum)
Location:   [facility] Room Number: 867 Place of Service:  ALF (13) Provider: Lennie Odor Tymika Grilli NP  Virgie Dad, MD  Patient Care Team: Virgie Dad, MD as PCP - General (Internal Medicine) Jaleal Schliep X, NP as Nurse Practitioner (Internal Medicine) Virgie Dad, MD as Consulting Physician (Internal Medicine)  Extended Emergency Contact Information Primary Emergency Contact: Dillard,[name]  Johnnette Litter of [place] Phone: [phone] Relation: Son  Code Status: DNR Goals of care: Advanced Directive information Advanced Directives 08/02/2020  Does Patient Have a Medical Advance Directive? Yes  Type of Advance Directive Out of facility DNR (pink MOST or yellow form)  Does patient want to make changes to medical advance directive? No - Guardian declined  Would patient like information on creating a medical advance directive? No - Patient declined  Pre-existing out of facility DNR order (yellow form or pink MOST form) -     Chief Complaint  Patient presents with  . Acute Visit    Lower back pain, dizziness    HPI:  Pt is a 87 y.o. female seen today for an acute visit for f/u ED eval R hip pain, X-Depascale R hip/pelvis 08/02/20 showed no acute fx or dislocation  The patient c/o lower back pain with movement, needs assistance with transferring.   The patient c/o dizziness when she is getting out of bed, denied change of vision, headache, chest pain/pressure, or palpitation, no noted focal weakness or slurred speech today.   The patient's R leg weakness from yesterday is resolved, CT Head 08/02/20 showed No acute intracranial injury.  No calvarial fracture.  Hx of CVA per CT head 08/02/20 Remote infarcts within the a occipital cortices bilaterally again noted. Interval development of remote infarcts within the left parietal lobe and periventricular white matter.  DDD per CT head/cervical spine 08/02/20 Advanced multilevel degenerative disc and degenerative joint disease, as  described above, resulting in multilevel mild-to-moderate neuroforaminal narrowing, most severe bilaterally at C4-5 and on the left at C5-6.    Vascular dementia,MMSE dropped from 01/02/19 29/30 to11/3/21 MMSE 18/30, failed clock drawing.07/15/19 CT head Mild generalized parenchymal atrophy and chronic small vessel ischemic disease, tolerated Memantine well.             HTN, blood pressure is controlled on Benazepril 48m qd. Bun/creat26/1.08 eGFR 44 08/02/20             CKD Bun/creat 26/1.08 eGFR 44 08/02/20 Gout stable, on Allopurinol 1043mqd.             Hypothyroidism, stable, on Levothyroxine. TSH 2.34 06/22/20    Past Medical History:  Diagnosis Date  . Cognitive changes   . Dementia ([facility]CFoley  . Depression   . Gout   . Hypertension   . Hypothyroidism   . Peripheral neuropathy    History reviewed. No pertinent surgical history.  Allergies  Allergen Reactions  . Actonel [Risedronate Sodium]   . Hct [Hydrochlorothiazide]   . Lipitor [Atorvastatin Calcium]   . Neomycin Other (See Comments)    Unknown "been so long ago, a Dermatologist told me"  . Polysporin [Bacitracin-Polymyxin B]   . Prolia [Denosumab]   . Sulfa Antibiotics   . Zocor [Simvastatin]     Allergies as of 08/03/2020      Reactions   Actonel [risedronate Sodium]    Hct [hydrochlorothiazide]    Lipitor [atorvastatin Calcium]    Neomycin Other (See Comments)   Unknown "been so long ago, a Dermatologist told me"  Polysporin [bacitracin-polymyxin B]    Prolia [denosumab]    Sulfa Antibiotics    Zocor [simvastatin]       Medication List       Accurate as of Aug 03, 2020 11:59 PM. If you have any questions, ask your nurse or doctor.        allopurinol 100 MG tablet Commonly known as: ZYLOPRIM Take 100 mg by mouth daily.   benazepril 10 MG tablet Commonly known as: LOTENSIN Take 10 mg by mouth daily.   Calcium Carb-Cholecalciferol  500-400 MG-UNIT Tabs Take 2 tablets by mouth daily.   CoQ10 100 MG Caps Take 2 capsules by mouth daily.   levothyroxine 100 MCG tablet Commonly known as: SYNTHROID Take 100 mcg by mouth once a week. On Wednesday   levothyroxine 50 MCG tablet Commonly known as: SYNTHROID Take 50 mcg by mouth daily before breakfast. Sun,Mon,Tue, Thur,Fri,Sat   memantine 10 MG tablet Commonly known as: NAMENDA Take 10 mg by mouth 2 (two) times daily.   Omega-3 1000 MG Caps Take 2 capsules by mouth 2 (two) times daily.   Vitamin D3 50 MCG (2000 UT) capsule Take 2,000 Units by mouth daily.       Review of Systems  Constitutional: Positive for activity change, appetite change and fatigue. Negative for fever.       Improving from yesterday.   HENT: Positive for hearing loss. Negative for congestion and voice change.   Eyes: Negative for visual disturbance.  Respiratory: Negative for cough and shortness of breath.   Cardiovascular: Negative for leg swelling.  Gastrointestinal: Negative for abdominal pain, constipation, nausea and vomiting.  Genitourinary: Negative for difficulty urinating, dysuria and urgency.  Musculoskeletal: Positive for back pain and gait problem.       Positional lower back pain.   Skin: Positive for wound. Negative for color change.  Neurological: Positive for dizziness. Negative for facial asymmetry, speech difficulty, weakness, light-headedness and headaches.       Memory lapses. Dizziness when getting out of bed, but not with head turning.   Psychiatric/Behavioral: Negative for behavioral problems and sleep disturbance. The patient is not nervous/anxious.     Immunization History  Administered Date(s) Administered  . Influenza, High Dose Seasonal PF 12/11/2018  . Influenza-Unspecified 03/24/2018, 12/21/2019  . Moderna Sars-Covid-2 Vaccination 03/15/2019, 04/21/2019, 01/17/2020  . Tdap 07/15/2019   Pertinent  Health Maintenance Due  Topic Date Due  . DEXA SCAN   Never done  . PNA vac Low Risk Adult (1 of 2 - PCV13) Never done  . INFLUENZA VACCINE  10/08/2020   No flowsheet data found. Functional Status Survey:    Vitals:   08/07/20 1628  BP: (!) 155/80  Pulse: 80  Resp: 20  Temp: (!) 97.3 F (36.3 C)  SpO2: 97%   There is no height or weight on file to calculate BMI. Physical Exam Vitals and nursing note reviewed.  Constitutional:      Comments: lethargic  HENT:     Head: Normocephalic and atraumatic.     Nose: Nose normal.     Mouth/Throat:     Mouth: Mucous membranes are moist.  Eyes:     Extraocular Movements: Extraocular movements intact.     Conjunctiva/sclera: Conjunctivae normal.     Pupils: Pupils are equal, round, and reactive to light.  Cardiovascular:     Rate and Rhythm: Normal rate and regular rhythm.     Heart sounds: No murmur heard.   Pulmonary:     Effort: Pulmonary  effort is normal.     Breath sounds: No rales.  Abdominal:     General: Bowel sounds are normal.     Palpations: Abdomen is soft.     Tenderness: There is no abdominal tenderness.  Musculoskeletal:        General: Tenderness present.     Cervical back: Normal range of motion and neck supple.     Right lower leg: No edema.     Left lower leg: No edema.     Comments: Lower back pain complained but no spinal spinous process tenderness palpated.   Skin:    General: Skin is warm and dry.     Comments: Occiput scalp laceration is well approximated, no s/s of bleeding or infection.   Neurological:     General: No focal deficit present.     Mental Status: She is alert. Mental status is at baseline.     Motor: No weakness.     Coordination: Coordination normal.     Gait: Gait abnormal.  Psychiatric:        Mood and Affect: Mood normal.        Behavior: Behavior normal.     Labs reviewed: Recent Labs    12/28/19 0000 01/11/20 0000 06/22/20 0011  NA 141 140 142  K 4.1 4.1 3.8  CL 107 106 106  CO2 25* 25* 25*  BUN 23* 23* 25*   CREATININE 1.0 1.2* 1.2*  CALCIUM 9.1 9.4 9.4   Recent Labs    12/28/19 0000 01/11/20 0000 06/22/20 0011  AST _0 ALT _1 ALKPHOS 84 78 81  ALBUMIN 3.7 3.7 4.1   Recent Labs    12/28/19 0000 01/11/20 0000 06/22/20 0011  WBC 8.4 7.8 8.7  NEUTROABS 6,065.00  --   --   HGB 13.4 13.2 13.9  HCT 40 39 42  PLT 220 214 209   Lab Results  Component Value Date   TSH 2.34 06/22/2020   No results found for: HGBA1C No results found for: CHOL, HDL, LDLCALC, LDLDIRECT, TRIG, CHOLHDL  Significant Diagnostic Results in last 30 days:  CT HEAD WO CONTRAST  Result Date: 08/02/2020 CLINICAL DATA:  Fall, facial trauma, scalp laceration, dizziness EXAM: CT HEAD WITHOUT CONTRAST CT CERVICAL SPINE WITHOUT CONTRAST TECHNIQUE: Multidetector CT imaging of the head and cervical spine was performed following the standard protocol without intravenous contrast. Multiplanar CT image reconstructions of the cervical spine were also generated. COMPARISON:  07/15/2019 FINDINGS: CT HEAD FINDINGS Brain: Remote left parietal, left periventricular, and bilateral occipital cortical infarcts are identified with interval development of the periventricular and parietal infarcts since prior examination. Mild parenchymal volume loss is stable in commensurate with the patient's age. Mild periventricular white matter changes are again noted most in keeping with small vessel ischemia. No evidence of acute intracranial hemorrhage or infarct. No abnormal mass effect or midline shift. No abnormal intra or extra-axial mass lesion or fluid collection. Cerebellum is unremarkable. Ventricular size is normal. Vascular: No asymmetric hyperdense vasculature at the skull base. Skull: Intact Sinuses/Orbits: The visualized paranasal sinuses are clear. Orbits are unremarkable. Other: Mastoid air cells and middle ear cavities are clear. Mild left supraorbital soft tissue swelling. CT CERVICAL SPINE FINDINGS Alignment: There is  stable physiologic 2-3 mm anterolisthesis of C3 upon C4 and C4 upon C5. Ankylosis of the left C6-7 facet. Skull base and vertebrae: The craniocervical alignment is normal. Severe degenerative arthritis involving the a right atlantoaxial articulation and the atlantodental articulation  are again identified. The atlantodental interval is not widened. There is no acute fracture or traumatic listhesis of the cervical spine. Vertebral body heights have been preserved Soft tissues and spinal canal: No prevertebral fluid or swelling. No visible canal hematoma. Disc levels: There is marked intervertebral disc space narrowing and endplate remodeling at M7-6 and C6-7 in keeping with changes of severe degenerative disc disease, similar to that noted on prior examination. Moderate degenerative disc disease is noted at C4-5, similar to prior exam. Remaining intervertebral disc heights are preserved. Review of the axial images demonstrates multilevel uncovertebral and facet arthrosis resulting in multilevel mild-to-moderate neuroforaminal narrowing, most severe on the right at C4-5 in on the left at C4-5 and C5-6. No significant canal stenosis. Upper chest: Unremarkable Other: None IMPRESSION: No acute intracranial injury.  No calvarial fracture. Remote infarcts within the a occipital cortices bilaterally again noted. Interval development of remote infarcts within the left parietal lobe and periventricular white matter. Stable senescent change. No acute fracture or listhesis of the cervical spine. Advanced multilevel degenerative disc and degenerative joint disease, as described above, resulting in multilevel mild-to-moderate neuroforaminal narrowing, most severe bilaterally at C4-5 and on the left at C5-6. Electronically Signed   By: Fidela Salisbury MD   On: 08/02/2020 03:01   CT CERVICAL SPINE WO CONTRAST  Result Date: 08/02/2020 CLINICAL DATA:  Fall, facial trauma, scalp laceration, dizziness EXAM: CT HEAD WITHOUT CONTRAST  CT CERVICAL SPINE WITHOUT CONTRAST TECHNIQUE: Multidetector CT imaging of the head and cervical spine was performed following the standard protocol without intravenous contrast. Multiplanar CT image reconstructions of the cervical spine were also generated. COMPARISON:  07/15/2019 FINDINGS: CT HEAD FINDINGS Brain: Remote left parietal, left periventricular, and bilateral occipital cortical infarcts are identified with interval development of the periventricular and parietal infarcts since prior examination. Mild parenchymal volume loss is stable in commensurate with the patient's age. Mild periventricular white matter changes are again noted most in keeping with small vessel ischemia. No evidence of acute intracranial hemorrhage or infarct. No abnormal mass effect or midline shift. No abnormal intra or extra-axial mass lesion or fluid collection. Cerebellum is unremarkable. Ventricular size is normal. Vascular: No asymmetric hyperdense vasculature at the skull base. Skull: Intact Sinuses/Orbits: The visualized paranasal sinuses are clear. Orbits are unremarkable. Other: Mastoid air cells and middle ear cavities are clear. Mild left supraorbital soft tissue swelling. CT CERVICAL SPINE FINDINGS Alignment: There is stable physiologic 2-3 mm anterolisthesis of C3 upon C4 and C4 upon C5. Ankylosis of the left C6-7 facet. Skull base and vertebrae: The craniocervical alignment is normal. Severe degenerative arthritis involving the a right atlantoaxial articulation and the atlantodental articulation are again identified. The atlantodental interval is not widened. There is no acute fracture or traumatic listhesis of the cervical spine. Vertebral body heights have been preserved Soft tissues and spinal canal: No prevertebral fluid or swelling. No visible canal hematoma. Disc levels: There is marked intervertebral disc space narrowing and endplate remodeling at K0-8 and C6-7 in keeping with changes of severe degenerative disc  disease, similar to that noted on prior examination. Moderate degenerative disc disease is noted at C4-5, similar to prior exam. Remaining intervertebral disc heights are preserved. Review of the axial images demonstrates multilevel uncovertebral and facet arthrosis resulting in multilevel mild-to-moderate neuroforaminal narrowing, most severe on the right at C4-5 in on the left at C4-5 and C5-6. No significant canal stenosis. Upper chest: Unremarkable Other: None IMPRESSION: No acute intracranial injury.  No calvarial fracture. Remote infarcts  within the a occipital cortices bilaterally again noted. Interval development of remote infarcts within the left parietal lobe and periventricular white matter. Stable senescent change. No acute fracture or listhesis of the cervical spine. Advanced multilevel degenerative disc and degenerative joint disease, as described above, resulting in multilevel mild-to-moderate neuroforaminal narrowing, most severe bilaterally at C4-5 and on the left at C5-6. Electronically Signed   By: Fidela Salisbury MD   On: 08/02/2020 03:01   DG Hip Unilat With Pelvis 2-3 Views Right  Result Date: 08/02/2020 CLINICAL DATA:  87 year old female with fall and right hip pain. EXAM: DG HIP (WITH OR WITHOUT PELVIS) 2-3V RIGHT COMPARISON:  Pelvic radiograph dated 07/15/2019 FINDINGS: There is no acute fracture or dislocation. The bones are osteopenic. Mild bilateral hip arthritic changes. There is degenerative changes of the lower lumbar spine. The soft tissues are unremarkable. Vascular calcifications noted. IMPRESSION: No acute fracture or dislocation. Electronically Signed   By: Anner Crete M.D.   On: 08/02/2020 17:16    Assessment/Plan: TIA (transient ischemic attack) The patient's R leg weakness from yesterday is resolved, CT Head 08/02/20 showed No acute intracranial injury.  No calvarial fracture. Adding ASA 26m qd.   Dizziness The patient c/o dizziness when she is getting out of  bed, denied change of vision, headache, chest pain/pressure, or palpitation, no noted focal weakness or slurred speech today.  Orthostatic Bp check, Meclizine 12.593mbid x 72 hours.   Lower back pain The patient c/o lower back pain with movement, needs assistance with transferring. Will obtain X-Nilsson lumbosacral spine, pelvis, schedule Tylenol [id number] with meals, therapy to eval/tx   History of CVA (cerebrovascular accident) Hx of CVA per CT head 08/02/20 Remote infarcts within the a occipital cortices bilaterally again noted. Interval development of remote infarcts within the left parietal lobe and periventricular white matter.   DDD (degenerative disc disease), cervical DDD per CT head/cervical spine 08/02/20 Advanced multilevel degenerative disc and degenerative joint disease, as described above, resulting in multilevel mild-to-moderate neuroforaminal narrowing, most severe bilaterally at C4-5 and on the left at C5-6.  [facility],MMSE dropped from 01/02/19 29/30 to11/3/21 MMSE 18/30, failed clock drawing.07/15/19 CT head Mild generalized parenchymal atrophy and chronic small vessel ischemic disease, tolerated Memantine well.  HTN (hypertension) blood pressure is controlled on Benazepril 8m57m. Bun/creat26/1.08 eGFR 44 08/02/20   CKD (chronic kidney disease) stage 3, GFR 30-59 ml/min (HCC) Bun/creat 26/1.08 eGFR 44 08/02/20   Gout  stable, on Allopurinol 100mg18m  Hypothyroidism stable, on Levothyroxine. TSH 2.34 06/22/20    Family/ staff Communication: plan of care reviewed with the patient and charge nurse.   Labs/tests ordered:  X-Hewitt lumbosacral spine, pelvis.   Time spend 40 minutes.

## 2020-08-07 ENCOUNTER — Encounter: Payer: Self-pay | Admitting: Nurse Practitioner

## 2020-08-07 DIAGNOSIS — Z23 Encounter for immunization: Secondary | ICD-10-CM | POA: Diagnosis not present

## 2020-08-09 DIAGNOSIS — Z9181 History of falling: Secondary | ICD-10-CM | POA: Diagnosis not present

## 2020-08-09 DIAGNOSIS — M6281 Muscle weakness (generalized): Secondary | ICD-10-CM | POA: Diagnosis not present

## 2020-08-09 DIAGNOSIS — R2681 Unsteadiness on feet: Secondary | ICD-10-CM | POA: Diagnosis not present

## 2020-08-09 DIAGNOSIS — R29898 Other symptoms and signs involving the musculoskeletal system: Secondary | ICD-10-CM | POA: Diagnosis not present

## 2020-08-09 DIAGNOSIS — R293 Abnormal posture: Secondary | ICD-10-CM | POA: Diagnosis not present

## 2020-08-14 DIAGNOSIS — Z9181 History of falling: Secondary | ICD-10-CM | POA: Diagnosis not present

## 2020-08-14 DIAGNOSIS — R29898 Other symptoms and signs involving the musculoskeletal system: Secondary | ICD-10-CM | POA: Diagnosis not present

## 2020-08-14 DIAGNOSIS — M6281 Muscle weakness (generalized): Secondary | ICD-10-CM | POA: Diagnosis not present

## 2020-08-14 DIAGNOSIS — R293 Abnormal posture: Secondary | ICD-10-CM | POA: Diagnosis not present

## 2020-08-14 DIAGNOSIS — R2681 Unsteadiness on feet: Secondary | ICD-10-CM | POA: Diagnosis not present

## 2020-08-15 DIAGNOSIS — R29898 Other symptoms and signs involving the musculoskeletal system: Secondary | ICD-10-CM | POA: Diagnosis not present

## 2020-08-15 DIAGNOSIS — R293 Abnormal posture: Secondary | ICD-10-CM | POA: Diagnosis not present

## 2020-08-15 DIAGNOSIS — Z9181 History of falling: Secondary | ICD-10-CM | POA: Diagnosis not present

## 2020-08-15 DIAGNOSIS — R2681 Unsteadiness on feet: Secondary | ICD-10-CM | POA: Diagnosis not present

## 2020-08-15 DIAGNOSIS — M6281 Muscle weakness (generalized): Secondary | ICD-10-CM | POA: Diagnosis not present

## 2020-08-16 DIAGNOSIS — R29898 Other symptoms and signs involving the musculoskeletal system: Secondary | ICD-10-CM | POA: Diagnosis not present

## 2020-08-16 DIAGNOSIS — M6281 Muscle weakness (generalized): Secondary | ICD-10-CM | POA: Diagnosis not present

## 2020-08-16 DIAGNOSIS — R293 Abnormal posture: Secondary | ICD-10-CM | POA: Diagnosis not present

## 2020-08-16 DIAGNOSIS — Z9181 History of falling: Secondary | ICD-10-CM | POA: Diagnosis not present

## 2020-08-16 DIAGNOSIS — R2681 Unsteadiness on feet: Secondary | ICD-10-CM | POA: Diagnosis not present

## 2020-08-17 DIAGNOSIS — M6281 Muscle weakness (generalized): Secondary | ICD-10-CM | POA: Diagnosis not present

## 2020-08-17 DIAGNOSIS — R2681 Unsteadiness on feet: Secondary | ICD-10-CM | POA: Diagnosis not present

## 2020-08-17 DIAGNOSIS — R29898 Other symptoms and signs involving the musculoskeletal system: Secondary | ICD-10-CM | POA: Diagnosis not present

## 2020-08-17 DIAGNOSIS — Z9181 History of falling: Secondary | ICD-10-CM | POA: Diagnosis not present

## 2020-08-17 DIAGNOSIS — R293 Abnormal posture: Secondary | ICD-10-CM | POA: Diagnosis not present

## 2020-08-19 DIAGNOSIS — Z9181 History of falling: Secondary | ICD-10-CM | POA: Diagnosis not present

## 2020-08-19 DIAGNOSIS — R29898 Other symptoms and signs involving the musculoskeletal system: Secondary | ICD-10-CM | POA: Diagnosis not present

## 2020-08-19 DIAGNOSIS — R293 Abnormal posture: Secondary | ICD-10-CM | POA: Diagnosis not present

## 2020-08-19 DIAGNOSIS — M6281 Muscle weakness (generalized): Secondary | ICD-10-CM | POA: Diagnosis not present

## 2020-08-19 DIAGNOSIS — R2681 Unsteadiness on feet: Secondary | ICD-10-CM | POA: Diagnosis not present

## 2020-08-21 DIAGNOSIS — Z9181 History of falling: Secondary | ICD-10-CM | POA: Diagnosis not present

## 2020-08-21 DIAGNOSIS — M6281 Muscle weakness (generalized): Secondary | ICD-10-CM | POA: Diagnosis not present

## 2020-08-21 DIAGNOSIS — R29898 Other symptoms and signs involving the musculoskeletal system: Secondary | ICD-10-CM | POA: Diagnosis not present

## 2020-08-21 DIAGNOSIS — R2681 Unsteadiness on feet: Secondary | ICD-10-CM | POA: Diagnosis not present

## 2020-08-21 DIAGNOSIS — R293 Abnormal posture: Secondary | ICD-10-CM | POA: Diagnosis not present

## 2020-08-22 DIAGNOSIS — R2681 Unsteadiness on feet: Secondary | ICD-10-CM | POA: Diagnosis not present

## 2020-08-22 DIAGNOSIS — Z9181 History of falling: Secondary | ICD-10-CM | POA: Diagnosis not present

## 2020-08-22 DIAGNOSIS — R293 Abnormal posture: Secondary | ICD-10-CM | POA: Diagnosis not present

## 2020-08-22 DIAGNOSIS — M6281 Muscle weakness (generalized): Secondary | ICD-10-CM | POA: Diagnosis not present

## 2020-08-22 DIAGNOSIS — R29898 Other symptoms and signs involving the musculoskeletal system: Secondary | ICD-10-CM | POA: Diagnosis not present

## 2020-08-23 DIAGNOSIS — R2681 Unsteadiness on feet: Secondary | ICD-10-CM | POA: Diagnosis not present

## 2020-08-23 DIAGNOSIS — R293 Abnormal posture: Secondary | ICD-10-CM | POA: Diagnosis not present

## 2020-08-23 DIAGNOSIS — R29898 Other symptoms and signs involving the musculoskeletal system: Secondary | ICD-10-CM | POA: Diagnosis not present

## 2020-08-23 DIAGNOSIS — M6281 Muscle weakness (generalized): Secondary | ICD-10-CM | POA: Diagnosis not present

## 2020-08-23 DIAGNOSIS — Z9181 History of falling: Secondary | ICD-10-CM | POA: Diagnosis not present

## 2020-08-24 DIAGNOSIS — R293 Abnormal posture: Secondary | ICD-10-CM | POA: Diagnosis not present

## 2020-08-24 DIAGNOSIS — R29898 Other symptoms and signs involving the musculoskeletal system: Secondary | ICD-10-CM | POA: Diagnosis not present

## 2020-08-24 DIAGNOSIS — M6281 Muscle weakness (generalized): Secondary | ICD-10-CM | POA: Diagnosis not present

## 2020-08-24 DIAGNOSIS — R2681 Unsteadiness on feet: Secondary | ICD-10-CM | POA: Diagnosis not present

## 2020-08-24 DIAGNOSIS — Z9181 History of falling: Secondary | ICD-10-CM | POA: Diagnosis not present

## 2020-08-27 DIAGNOSIS — Z9181 History of falling: Secondary | ICD-10-CM | POA: Diagnosis not present

## 2020-08-27 DIAGNOSIS — R2681 Unsteadiness on feet: Secondary | ICD-10-CM | POA: Diagnosis not present

## 2020-08-27 DIAGNOSIS — R29898 Other symptoms and signs involving the musculoskeletal system: Secondary | ICD-10-CM | POA: Diagnosis not present

## 2020-08-27 DIAGNOSIS — R293 Abnormal posture: Secondary | ICD-10-CM | POA: Diagnosis not present

## 2020-08-27 DIAGNOSIS — M6281 Muscle weakness (generalized): Secondary | ICD-10-CM | POA: Diagnosis not present

## 2020-08-28 DIAGNOSIS — R29898 Other symptoms and signs involving the musculoskeletal system: Secondary | ICD-10-CM | POA: Diagnosis not present

## 2020-08-28 DIAGNOSIS — R2681 Unsteadiness on feet: Secondary | ICD-10-CM | POA: Diagnosis not present

## 2020-08-28 DIAGNOSIS — R293 Abnormal posture: Secondary | ICD-10-CM | POA: Diagnosis not present

## 2020-08-28 DIAGNOSIS — M6281 Muscle weakness (generalized): Secondary | ICD-10-CM | POA: Diagnosis not present

## 2020-08-28 DIAGNOSIS — Z9181 History of falling: Secondary | ICD-10-CM | POA: Diagnosis not present

## 2020-08-29 ENCOUNTER — Encounter: Payer: Self-pay | Admitting: Nurse Practitioner

## 2020-08-29 ENCOUNTER — Non-Acute Institutional Stay: Payer: Medicare Other | Admitting: Nurse Practitioner

## 2020-08-29 ENCOUNTER — Other Ambulatory Visit: Payer: Self-pay | Admitting: *Deleted

## 2020-08-29 DIAGNOSIS — I10 Essential (primary) hypertension: Secondary | ICD-10-CM | POA: Diagnosis not present

## 2020-08-29 DIAGNOSIS — M549 Dorsalgia, unspecified: Secondary | ICD-10-CM

## 2020-08-29 DIAGNOSIS — E039 Hypothyroidism, unspecified: Secondary | ICD-10-CM | POA: Diagnosis not present

## 2020-08-29 DIAGNOSIS — M6281 Muscle weakness (generalized): Secondary | ICD-10-CM | POA: Diagnosis not present

## 2020-08-29 DIAGNOSIS — M109 Gout, unspecified: Secondary | ICD-10-CM

## 2020-08-29 DIAGNOSIS — Z8673 Personal history of transient ischemic attack (TIA), and cerebral infarction without residual deficits: Secondary | ICD-10-CM | POA: Diagnosis not present

## 2020-08-29 DIAGNOSIS — M5136 Other intervertebral disc degeneration, lumbar region: Secondary | ICD-10-CM | POA: Diagnosis not present

## 2020-08-29 DIAGNOSIS — M5134 Other intervertebral disc degeneration, thoracic region: Secondary | ICD-10-CM | POA: Diagnosis not present

## 2020-08-29 DIAGNOSIS — Z9181 History of falling: Secondary | ICD-10-CM | POA: Diagnosis not present

## 2020-08-29 DIAGNOSIS — F0150 Vascular dementia without behavioral disturbance: Secondary | ICD-10-CM

## 2020-08-29 DIAGNOSIS — G459 Transient cerebral ischemic attack, unspecified: Secondary | ICD-10-CM | POA: Diagnosis not present

## 2020-08-29 DIAGNOSIS — N1831 Chronic kidney disease, stage 3a: Secondary | ICD-10-CM

## 2020-08-29 DIAGNOSIS — M5030 Other cervical disc degeneration, unspecified cervical region: Secondary | ICD-10-CM

## 2020-08-29 DIAGNOSIS — R293 Abnormal posture: Secondary | ICD-10-CM | POA: Diagnosis not present

## 2020-08-29 DIAGNOSIS — R29898 Other symptoms and signs involving the musculoskeletal system: Secondary | ICD-10-CM | POA: Diagnosis not present

## 2020-08-29 DIAGNOSIS — S22050A Wedge compression fracture of T5-T6 vertebra, initial encounter for closed fracture: Secondary | ICD-10-CM | POA: Diagnosis not present

## 2020-08-29 DIAGNOSIS — R2681 Unsteadiness on feet: Secondary | ICD-10-CM | POA: Diagnosis not present

## 2020-08-29 DIAGNOSIS — S32030A Wedge compression fracture of third lumbar vertebra, initial encounter for closed fracture: Secondary | ICD-10-CM | POA: Diagnosis not present

## 2020-08-29 MED ORDER — TRAMADOL HCL 50 MG PO TABS: ORAL_TABLET | ORAL | 0 refills | Status: DC

## 2020-08-29 NOTE — Assessment & Plan Note (Signed)
Bun/creat 26/1.08 eGFR 44 08/02/20

## 2020-08-29 NOTE — Assessment & Plan Note (Signed)
Vascular dementia, MMSE dropped from 01/02/19 29/30 to11/3/21 MMSE 18/30, failed clock drawing. 07/15/19 CT head Mild generalized parenchymal atrophy and chronic small vessel ischemic disease, tolerated Memantine well.

## 2020-08-29 NOTE — Assessment & Plan Note (Signed)
blood pressure is controlled on Benazepril 10mg qd. Bun/creat26/1.08 eGFR 44 08/02/20  

## 2020-08-29 NOTE — Assessment & Plan Note (Signed)
The patient's R leg weakness is resolved, CT Head 08/02/20 showed No acute intracranial injury.  No calvarial fracture. Continue ASA

## 2020-08-29 NOTE — Assessment & Plan Note (Signed)
Hx of CVA per CT head 08/02/20 Remote infarcts within the a occipital cortices bilaterally again noted. Interval development of remote infarcts within the left parietal lobe and periventricular white matter.

## 2020-08-29 NOTE — Progress Notes (Addendum)
Location:   [place] Room Number: JM426 Place of Service:  ALF (13) Provider: Lennie Odor Tiara Bartoli NP  Gina Dad, MD  Patient Care Team: Gina Dad, MD as PCP - General (Internal Medicine) Gina Hewlett X, NP as Nurse Practitioner (Internal Medicine) Gina Dad, MD as Consulting Physician (Internal Medicine)  Extended Emergency Contact Information Primary Emergency Contact: [name]  Johnnette Litter of [place] Phone: [phone] Relation: Son  Code Status: DNR Goals of care: Advanced Directive information Advanced Directives 08/29/2020  Does Patient Have Stevenson Medical Advance Directive? No  Type of Advance Directive -  Does patient want to make changes to medical advance directive? No - Patient declined  Would patient like information on creating Stevenson medical advance directive? -  Pre-existing out of facility DNR order (yellow form or pink MOST form) -     Chief Complaint  Patient presents with   Medical Management of Chronic Issues    Routine follow up.   Health Maintenance    Discuss need for shingles vaccine, DEXA scan, and PNA vaccine.     HPI:  Pt is Stevenson 87 y.o. female seen today for an acute visit for the lower portion of th mid back pain, positional, no spinal spinous process tenderness palpation.   R hip pain, not complained today. Stevenson-Wickard R hip/pelvis 08/02/20 showed no acute fx or dislocation             The patient's R leg weakness is resolved, CT Head 08/02/20 showed No acute intracranial injury.  No calvarial fracture. Taking ASA             Hx of CVA per CT head 08/02/20 Remote infarcts within the Stevenson occipital cortices bilaterally again noted. Interval development of remote infarcts within the left parietal lobe and periventricular white matter.             DDD per CT head/cervical spine 08/02/20 Advanced multilevel degenerative disc and degenerative joint disease, as described above, resulting in multilevel mild-to-moderate neuroforaminal narrowing, most  severe bilaterally at C4-5 and on the left at C5-6.             Vascular dementia, MMSE dropped from 01/02/19 29/30 to11/3/21 MMSE 18/30, failed clock drawing. 07/15/19 CT head Mild generalized parenchymal atrophy and chronic small vessel ischemic disease, tolerated Memantine well.                          HTN, blood pressure is controlled on Benazepril 74m qd. Bun/creat 26/1.08 eGFR 44 08/02/20             CKD Bun/creat 26/1.08 eGFR 44 08/02/20             Gout stable, on Allopurinol 1017mqd.                        Hypothyroidism, stable, on Levothyroxine. TSH 2.34 06/22/20                            Past Medical History:  Diagnosis Date   Cognitive changes    Dementia ([facility]   Depression    Gout    Hypertension    Hypothyroidism    Peripheral neuropathy    History reviewed. No pertinent surgical history.  Allergies  Allergen Reactions   Actonel [Risedronate Sodium]    Hct [Hydrochlorothiazide]    Lipitor [Atorvastatin Calcium]  Neomycin Other (See Comments)    Unknown "been so long ago, Stevenson Dermatologist told me"   Polysporin [Bacitracin-Polymyxin B]    Prolia [Denosumab]    Sulfa Antibiotics    Zocor [Simvastatin]     Allergies as of 08/29/2020       Reactions   Actonel [risedronate Sodium]    Hct [hydrochlorothiazide]    Lipitor [atorvastatin Calcium]    Neomycin Other (See Comments)   Unknown "been so long ago, Stevenson Dermatologist told me"   Polysporin [bacitracin-polymyxin B]    Prolia [denosumab]    Sulfa Antibiotics    Zocor [simvastatin]         Medication List        Accurate as of August 29, 2020 11:59 PM. If you have any questions, ask your nurse or doctor.          STOP taking these medications    Vitamin D3 50 MCG (2000 UT) capsule Stopped by: [name] Stevenson Gina Mandato, NP       TAKE these medications    acetaminophen 325 MG tablet Commonly known as: TYLENOL Take 650 mg by mouth 3 (three) times daily.   allopurinol 100 MG tablet Commonly known as:  ZYLOPRIM Take 100 mg by mouth daily.   aspirin EC 81 MG tablet Take 81 mg by mouth daily. Swallow whole.   benazepril 10 MG tablet Commonly known as: LOTENSIN Take 10 mg by mouth daily.   Calcium Carb-Cholecalciferol 500-400 MG-UNIT Tabs Take 2 tablets by mouth daily.   CoQ10 100 MG Caps Take 2 capsules by mouth daily.   levothyroxine 100 MCG tablet Commonly known as: SYNTHROID Take 100 mcg by mouth once Stevenson week. On Wednesday   levothyroxine 50 MCG tablet Commonly known as: SYNTHROID Take 50 mcg by mouth daily before breakfast. Sun,Mon,Tue, Thur,Fri,Sat   memantine 10 MG tablet Commonly known as: NAMENDA Take 10 mg by mouth 2 (two) times daily.   Omega-3 1000 MG Caps Take 2 capsules by mouth 2 (two) times daily.   traMADol 50 MG tablet Commonly known as: ULTRAM Take one tablet by mouth three times daily With meals. What changed:  how to take this when to take this additional instructions Changed by: [name], CMA        Review of Systems  Constitutional:  Positive for activity change, appetite change and fatigue. Negative for fever.       Improving from yesterday.   HENT:  Positive for hearing loss. Negative for congestion and voice change.   Eyes:  Negative for visual disturbance.  Respiratory:  Negative for cough and shortness of breath.   Cardiovascular:  Negative for leg swelling.  Gastrointestinal:  Negative for abdominal pain, constipation, nausea and vomiting.  Genitourinary:  Negative for difficulty urinating, dysuria and urgency.  Musculoskeletal:  Positive for back pain and gait problem.       Positional lower portion of the mid back pain. No c/o neck, right hip, or lower back pain.   Skin:  Positive for wound. Negative for color change.  Neurological:  Negative for dizziness, speech difficulty, weakness, light-headedness and headaches.       Memory lapses. Dizziness when getting out of bed, but not with head turning.   Psychiatric/Behavioral:   Negative for behavioral problems and sleep disturbance. The patient is not nervous/anxious.    Immunization History  Administered Date(s) Administered   Influenza, High Dose Seasonal PF 12/11/2018   Influenza-Unspecified 03/24/2018, 12/21/2019   Moderna Sars-Covid-2 Vaccination 03/15/2019, 04/21/2019, 01/17/2020,  08/07/2020   Tdap 07/15/2019   Pertinent  Health Maintenance Due  Topic Date Due   DEXA SCAN  Never done   PNA vac Low Risk Adult (1 of 2 - PCV13) Never done   INFLUENZA VACCINE  10/08/2020   No flowsheet data found. Functional Status Survey:    Vitals:   08/29/20 1052  BP: 130/70  Pulse: 79  Resp: 20  Temp: 98.1 F (36.7 C)  SpO2: 96%  Weight: 146 lb 12.8 oz (66.6 kg)  Height: _0  (1.549 m)   Body mass index is 27.74 kg/m. Physical Exam Vitals and nursing note reviewed.  Constitutional:      Comments: lethargic  HENT:     Head: Normocephalic and atraumatic.     Mouth/Throat:     Mouth: Mucous membranes are moist.  Eyes:     Extraocular Movements: Extraocular movements intact.     Conjunctiva/sclera: Conjunctivae normal.     Pupils: Pupils are equal, round, and reactive to light.  Cardiovascular:     Rate and Rhythm: Normal rate and regular rhythm.     Heart sounds: No murmur heard. Pulmonary:     Effort: Pulmonary effort is normal.     Breath sounds: No rales.  Abdominal:     General: Bowel sounds are normal.     Palpations: Abdomen is soft.     Tenderness: no abdominal tenderness  Musculoskeletal:     Cervical back: Normal range of motion and neck supple.     Right lower leg: No edema.     Left lower leg: No edema.     Comments: no spinal spinous process tenderness palpated.   Skin:    General: Skin is warm and dry.     Comments: Occiput scalp laceration is well approximated, no s/s of bleeding or infection.   Neurological:     General: No focal deficit present.     Mental Status: She is alert. Mental status is at baseline.     Motor: No  weakness.     Coordination: Coordination normal.     Gait: Gait abnormal.  Psychiatric:        Mood and Affect: Mood normal.        Behavior: Behavior normal.    Labs reviewed: Recent Labs    12/28/19 0000 01/11/20 0000 06/22/20 0011  NA 141 140 142  K 4.1 4.1 3.8  CL 107 106 106  CO2 25* 25* 25*  BUN 23* 23* 25*  CREATININE 1.0 1.2* 1.2*  CALCIUM 9.1 9.4 9.4   Recent Labs    12/28/19 0000 01/11/20 0000 06/22/20 0011  AST _1 ALT _2 ALKPHOS 84 78 81  ALBUMIN 3.7 3.7 4.1   Recent Labs    12/28/19 0000 01/11/20 0000 06/22/20 0011  WBC 8.4 7.8 8.7  NEUTROABS 6,065.00  --   --   HGB 13.4 13.2 13.9  HCT 40 39 42  PLT 220 214 209   Lab Results  Component Value Date   TSH 2.34 06/22/2020   No results found for: HGBA1C No results found for: CHOL, HDL, LDLCALC, LDLDIRECT, TRIG, CHOLHDL  Significant Diagnostic Results in last 30 days:  No results found.  Assessment/Plan: Mid back pain he lower portion of th mid back pain, positional, no spinal spinous process tenderness palpation. 08/03/20 Stevenson-Orsino L spine, moderate compression fx of L3, uncertain age. Will obtain Stevenson-Swo[place] thoracic spine/lumbar spine to evaluation further. Will schedule Tramadol 29m tid with meals. The  patient needs SNF [facility] for care. 08/03/20 Stevenson-Barasch L spine, moderate compression fx of L3, uncertain age 2/77/82 Stevenson-Strader T/L spine: T5, T8 compression fractures of uncertain age. Moderate compression fracture of L3 of uncertain age.    TIA (transient ischemic attack) The patient's R leg weakness is resolved, CT Head 08/02/20 showed No acute intracranial injury.  No calvarial fracture. Continue ASA  History of CVA (cerebrovascular accident) Hx of CVA per CT head 08/02/20 Remote infarcts within the Stevenson occipital cortices bilaterally again noted. Interval development of remote infarcts within the left parietal lobe and periventricular white matter.  DDD (degenerative disc disease), cervical DDD per CT  head/cervical spine 08/02/20 Advanced multilevel degenerative disc and degenerative joint disease, as described above, resulting in multilevel mild-to-moderate neuroforaminal narrowing, most severe bilaterally at C4-5 and on the left at C5-6.  Vascular dementia [facility]) Vascular dementia, MMSE dropped from 01/02/19 29/30 to11/3/21 MMSE 18/30, failed clock drawing. 07/15/19 CT head Mild generalized parenchymal atrophy and chronic small vessel ischemic disease, tolerated Memantine well.       HTN (hypertension) blood pressure is controlled on Benazepril 85m qd. Bun/creat 26/1.08 eGFR 44 08/02/20  CKD (chronic kidney disease) stage 3, GFR 30-59 ml/min (HCC) Bun/creat 26/1.08 eGFR 44 08/02/20  Gout stable, on Allopurinol 1039mqd.    Hypothyroidism stable, on Levothyroxine. TSH 2.34 06/22/20    Family/ staff Communication: plan of care reviewed with the patient and charge nurse.   Labs/tests ordered:  Stevenson-Diefendorf thoracic and lumbar spine.   Time spend 40 minutes.

## 2020-08-29 NOTE — Assessment & Plan Note (Signed)
stable, on Allopurinol 100mg  qd.

## 2020-08-29 NOTE — Assessment & Plan Note (Signed)
DDD per CT head/cervical spine 08/02/20 Advanced multilevel degenerative disc and degenerative joint disease, as described above, resulting in multilevel mild-to-moderate neuroforaminal narrowing, most severe bilaterally at C4-5 and on the left at C5-6.

## 2020-08-29 NOTE — Assessment & Plan Note (Addendum)
he lower portion of th mid back pain, positional, no spinal spinous process tenderness palpation. 08/03/20 X-Denker L spine, moderate compression fx of L3, uncertain age. Will obtain X-Roberge thoracic spine/lumbar spine to evaluation further. Will schedule Tramadol 50mg  tid with meals. The patient needs SNF [facility] for care. 08/03/20 X-Schreiter L spine, moderate compression fx of L3, uncertain age 85/49/67 X-Deerman T/L spine: T5, T8 compression fractures of uncertain age. Moderate compression fracture of L3 of uncertain age.  5/[birth year] spoke with the patient's son Clair Gulling at [phone]: agreed with pain management, supportive care, only ortho consultation if pain cannot be controlled.

## 2020-08-29 NOTE — Assessment & Plan Note (Signed)
stable, on Levothyroxine. TSH 2.34 06/22/20

## 2020-08-29 NOTE — Telephone Encounter (Signed)
Received Rx Request from [facility] for Tramadol. Medication is not in current medication list. Reviewed last OV note. Pended Rx and sent to [facility] for approval.   OV Note Dated 08/29/2020 Assessment/Plan: Mid back pain he lower portion of th mid back pain, positional, no spinal spinous process tenderness palpation. 08/03/20 X-Armas L spine, moderate compression fx of L3, uncertain age. Will obtain X-Karger thoracic spine/lumbar spine to evaluation further. Will schedule Tramadol 50mg  tid with meals. The patient needs SNF [facility] for care.

## 2020-08-30 ENCOUNTER — Encounter: Payer: Self-pay | Admitting: Nurse Practitioner

## 2020-08-30 DIAGNOSIS — M6281 Muscle weakness (generalized): Secondary | ICD-10-CM | POA: Diagnosis not present

## 2020-08-30 DIAGNOSIS — R29898 Other symptoms and signs involving the musculoskeletal system: Secondary | ICD-10-CM | POA: Diagnosis not present

## 2020-08-30 DIAGNOSIS — Z9181 History of falling: Secondary | ICD-10-CM | POA: Diagnosis not present

## 2020-08-30 DIAGNOSIS — R293 Abnormal posture: Secondary | ICD-10-CM | POA: Diagnosis not present

## 2020-08-30 DIAGNOSIS — R2681 Unsteadiness on feet: Secondary | ICD-10-CM | POA: Diagnosis not present

## 2020-08-31 DIAGNOSIS — M6281 Muscle weakness (generalized): Secondary | ICD-10-CM | POA: Diagnosis not present

## 2020-08-31 DIAGNOSIS — R2681 Unsteadiness on feet: Secondary | ICD-10-CM | POA: Diagnosis not present

## 2020-08-31 DIAGNOSIS — Z9181 History of falling: Secondary | ICD-10-CM | POA: Diagnosis not present

## 2020-08-31 DIAGNOSIS — R29898 Other symptoms and signs involving the musculoskeletal system: Secondary | ICD-10-CM | POA: Diagnosis not present

## 2020-08-31 DIAGNOSIS — R293 Abnormal posture: Secondary | ICD-10-CM | POA: Diagnosis not present

## 2020-09-18 ENCOUNTER — Encounter: Payer: Self-pay | Admitting: Internal Medicine

## 2020-09-18 ENCOUNTER — Non-Acute Institutional Stay (SKILLED_NURSING_FACILITY): Payer: Medicare Other | Admitting: Internal Medicine

## 2020-09-18 DIAGNOSIS — F0150 Vascular dementia without behavioral disturbance: Secondary | ICD-10-CM

## 2020-09-18 DIAGNOSIS — E039 Hypothyroidism, unspecified: Secondary | ICD-10-CM | POA: Diagnosis not present

## 2020-09-18 DIAGNOSIS — I639 Cerebral infarction, unspecified: Secondary | ICD-10-CM | POA: Diagnosis not present

## 2020-09-18 DIAGNOSIS — N1831 Chronic kidney disease, stage 3a: Secondary | ICD-10-CM

## 2020-09-18 DIAGNOSIS — S22000A Wedge compression fracture of unspecified thoracic vertebra, initial encounter for closed fracture: Secondary | ICD-10-CM

## 2020-09-18 DIAGNOSIS — I10 Essential (primary) hypertension: Secondary | ICD-10-CM

## 2020-09-18 NOTE — Progress Notes (Signed)
Provider:  Veleta Miners MD Location:   [facility] Room Number: 40 Place of Service:  SNF ([id number])  PCP: Virgie Dad, MD Patient Care Team: Virgie Dad, MD as PCP - General (Internal Medicine) Mast, Man X, NP as Nurse Practitioner (Internal Medicine) Virgie Dad, MD as Consulting Physician (Internal Medicine)  Extended Emergency Contact Information Primary Emergency Contact: Salaam,[name]  Johnnette Litter of [place] Phone: [phone] Relation: Son  Code Status: DNR Goals of Care: Advanced Directive information Advanced Directives 09/18/2020  Does Patient Have a Medical Advance Directive? Yes  Type of Advance Directive [place]  Does patient want to make changes to medical advance directive? No - Patient declined  Copy of [place] in Chart? Yes - validated most recent copy scanned in chart (See row information)  Would patient like information on creating a medical advance directive? -  Pre-existing out of facility DNR order (yellow form or pink MOST form) -      Chief Complaint  Patient presents with   New Admit To SNF    Admission to SNF    HPI: Patient is a 87 y.o. female seen today for admission to SNF  Patient has a history of hypertension, gout, hypothyroidism and peripheral neuropathy and Osteoporosis Cognitive impairment due to Vascular Dementia  Since past 2 months patient had 2 falls. Her CT scan of had showed new remote infarcts in Left Parietal area and Periventricular area Also was seen to have Lumbar Compression Fracture at L3 and at T 5 and T 8  She also has Cervical spine Narrowing  Since then she has been having difficulty doing her ADLS Now transferred to SNF for higher level of care and Therapy Patient did nto have any complains today Pain seems controlled on Tylenol and tramadol Walking with her walker Weight is stable Appetite is good   Past Medical History:  Diagnosis Date    Cognitive changes    Dementia ([place])    Depression    Gout    Hypertension    Hypothyroidism    Peripheral neuropathy    History reviewed. No pertinent surgical history.  reports that she has never smoked. She has never used smokeless tobacco. She reports that she does not drink alcohol and does not use drugs. Social History   Socioeconomic History   Marital status: Widowed    Spouse name: Not on file   Number of children: Not on file   Years of education: Not on file   Highest education level: Not on file  Occupational History   Not on file  Tobacco Use   Smoking status: Never   Smokeless tobacco: Never  Substance and Sexual Activity   Alcohol use: Never   Drug use: Never   Sexual activity: Not on file  Other Topics Concern   Not on file  Social History Narrative   Not on file   Social Determinants of Health   Financial Resource Strain: Not on file  Food Insecurity: Not on file  Transportation Needs: Not on file  Physical Activity: Not on file  Stress: Not on file  Social Connections: Not on file  Intimate Partner Violence: Not on file    Functional Status Survey:    History reviewed. No pertinent family history.  Health Maintenance  Topic Date Due   Zoster Vaccines- Shingrix (1 of 2) Never done   DEXA SCAN  Never done   PNA vac Low Risk Adult (1  of 2 - PCV13) Never done   INFLUENZA VACCINE  10/08/2020   TETANUS/TDAP  07/14/2029   COVID-19 Vaccine  Completed   HPV VACCINES  Aged Out    Allergies  Allergen Reactions   Actonel [Risedronate Sodium]    Hct [Hydrochlorothiazide]    Lipitor [Atorvastatin Calcium]    Neomycin Other (See Comments)    Unknown "been so long ago, a Dermatologist told me"   Polysporin [Bacitracin-Polymyxin B]    Prolia [Denosumab]    Sulfa Antibiotics    Zocor [Simvastatin]     Allergies as of 09/18/2020       Reactions   Actonel [risedronate Sodium]    Hct [hydrochlorothiazide]    Lipitor [atorvastatin Calcium]     Neomycin Other (See Comments)   Unknown "been so long ago, a Dermatologist told me"   Polysporin [bacitracin-polymyxin B]    Prolia [denosumab]    Sulfa Antibiotics    Zocor [simvastatin]         Medication List        Accurate as of September 18, 2020 11:59 PM. If you have any questions, ask your nurse or doctor.          STOP taking these medications    acetaminophen 325 MG tablet Commonly known as: TYLENOL Stopped by: Virgie Dad, MD       TAKE these medications    Alfalfa 500 MG Tabs Take 500 mg by mouth daily.   allopurinol 100 MG tablet Commonly known as: ZYLOPRIM Take 100 mg by mouth daily. What changed: Another medication with the same name was removed. Continue taking this medication, and follow the directions you see here. Changed by: Virgie Dad, MD   ascorbic acid 1000 MG tablet Commonly known as: VITAMIN C Take 1,000 mg by mouth daily.   aspirin 81 MG chewable tablet Chew 81 mg by mouth daily. What changed: Another medication with the same name was removed. Continue taking this medication, and follow the directions you see here. Changed by: Virgie Dad, MD   benazepril 10 MG tablet Commonly known as: LOTENSIN Take 10 mg by mouth daily.   Calcium Carb-Cholecalciferol 500-400 MG-UNIT Tabs Take 2 tablets by mouth daily.   cholecalciferol 25 MCG (1000 UNIT) tablet Commonly known as: VITAMIN D3 Take 1,000 Units by mouth 2 (two) times daily.   colchicine 0.6 MG tablet Take 0.6 mg by mouth 2 (two) times daily.   CoQ10 100 MG Caps Take 2 capsules by mouth daily.   levothyroxine 100 MCG tablet Commonly known as: SYNTHROID Take 100 mcg by mouth once a week. On Wednesday   levothyroxine 50 MCG tablet Commonly known as: SYNTHROID Take 50 mcg by mouth daily before breakfast. Sun,Mon,Tue, Thur,Fri,Sat   memantine 10 MG tablet Commonly known as: NAMENDA Take 10 mg by mouth 2 (two) times daily. What changed: Another medication with the  same name was removed. Continue taking this medication, and follow the directions you see here. Changed by: Virgie Dad, MD   Omega-3 1000 MG Caps Take 2 capsules by mouth 2 (two) times daily.   pregabalin 50 MG capsule Commonly known as: LYRICA Take 50 mg by mouth 2 (two) times daily.   traMADol 50 MG tablet Commonly known as: ULTRAM Take 1 tablet (50 mg total) by mouth 3 (three) times daily with meals. What changed:  how much to take how to take this when to take this additional instructions Changed by: Virgie Dad, MD  Review of Systems  Constitutional:  Positive for activity change.  HENT: Negative.    Respiratory: Negative.    Cardiovascular: Negative.   Gastrointestinal: Negative.   Genitourinary: Negative.   Musculoskeletal:  Positive for back pain and gait problem.  Skin: Negative.   Neurological:  Positive for weakness.  Psychiatric/Behavioral:  Positive for confusion and dysphoric mood.    Vitals:   09/18/20 0902  BP: 128/68  Pulse: 62  Resp: 20  Temp: (!) 96.8 F (36 C)  SpO2: 96%  Weight: 146 lb 12.8 oz (66.6 kg)  Height: 5\' 1"  (1.549 m)   Body mass index is 27.74 kg/m. Physical Exam Constitutional: . Well-developed and well-nourished.  HENT:  Head: Normocephalic.  Mouth/Throat: Oropharynx is clear and moist.  Eyes: Pupils are equal, round, and reactive to light.  Neck: Neck supple.  Cardiovascular: Normal rate and normal heart sounds.  No murmur heard. Pulmonary/Chest: Effort normal and breath sounds normal. No respiratory distress. No wheezes. She has no rales.  Abdominal: Soft. Bowel sounds are normal. No distension. There is no tenderness. There is no rebound.  Musculoskeletal: No edema.  Lymphadenopathy: none Neurological: Walking well with her walker Skin: Skin is warm and dry.  Psychiatric: Normal mood and affect. Behavior is normal. Thought content normal.   Labs reviewed: Basic Metabolic Panel: Recent Labs     12/28/19 0000 01/11/20 0000 06/22/20 0011  NA 141 140 142  K 4.1 4.1 3.8  CL 107 106 106  CO2 25* 25* 25*  BUN 23* 23* 25*  CREATININE 1.0 1.2* 1.2*  CALCIUM 9.1 9.4 9.4   Liver Function Tests: Recent Labs    12/28/19 0000 01/11/20 0000 06/22/20 0011  AST 16 17 17   ALT 12 10 12   ALKPHOS 84 78 81  ALBUMIN 3.7 3.7 4.1   No results for input(s): LIPASE, AMYLASE in the last 8760 hours. No results for input(s): AMMONIA in the last 8760 hours. CBC: Recent Labs    12/28/19 0000 01/11/20 0000 06/22/20 0011  WBC 8.4 7.8 8.7  NEUTROABS 6,065.00  --   --   HGB 13.4 13.2 13.9  HCT 40 39 42  PLT 220 214 209   Cardiac Enzymes: No results for input(s): CKTOTAL, CKMB, CKMBINDEX, TROPONINI in the last 8760 hours. BNP: Invalid input(s): POCBNP No results found for: HGBA1C Lab Results  Component Value Date   TSH 2.34 06/22/2020   No results found for: VITAMINB12 No results found for: FOLATE No results found for: IRON, TIBC, FERRITIN  Imaging and Procedures obtained prior to SNF admission: CT HEAD WO CONTRAST  Result Date: 08/02/2020 CLINICAL DATA:  Fall, facial trauma, scalp laceration, dizziness EXAM: CT HEAD WITHOUT CONTRAST CT CERVICAL SPINE WITHOUT CONTRAST TECHNIQUE: Multidetector CT imaging of the head and cervical spine was performed following the standard protocol without intravenous contrast. Multiplanar CT image reconstructions of the cervical spine were also generated. COMPARISON:  07/15/2019 FINDINGS: CT HEAD FINDINGS Brain: Remote left parietal, left periventricular, and bilateral occipital cortical infarcts are identified with interval development of the periventricular and parietal infarcts since prior examination. Mild parenchymal volume loss is stable in commensurate with the patient's age. Mild periventricular white matter changes are again noted most in keeping with small vessel ischemia. No evidence of acute intracranial hemorrhage or infarct. No abnormal mass  effect or midline shift. No abnormal intra or extra-axial mass lesion or fluid collection. Cerebellum is unremarkable. Ventricular size is normal. Vascular: No asymmetric hyperdense vasculature at the skull base. Skull: Intact Sinuses/Orbits: The  visualized paranasal sinuses are clear. Orbits are unremarkable. Other: Mastoid air cells and middle ear cavities are clear. Mild left supraorbital soft tissue swelling. CT CERVICAL SPINE FINDINGS Alignment: There is stable physiologic 2-3 mm anterolisthesis of C3 upon C4 and C4 upon C5. Ankylosis of the left C6-7 facet. Skull base and vertebrae: The craniocervical alignment is normal. Severe degenerative arthritis involving the a right atlantoaxial articulation and the atlantodental articulation are again identified. The atlantodental interval is not widened. There is no acute fracture or traumatic listhesis of the cervical spine. Vertebral body heights have been preserved Soft tissues and spinal canal: No prevertebral fluid or swelling. No visible canal hematoma. Disc levels: There is marked intervertebral disc space narrowing and endplate remodeling at M5-7 and C6-7 in keeping with changes of severe degenerative disc disease, similar to that noted on prior examination. Moderate degenerative disc disease is noted at C4-5, similar to prior exam. Remaining intervertebral disc heights are preserved. Review of the axial images demonstrates multilevel uncovertebral and facet arthrosis resulting in multilevel mild-to-moderate neuroforaminal narrowing, most severe on the right at C4-5 in on the left at C4-5 and C5-6. No significant canal stenosis. Upper chest: Unremarkable Other: None IMPRESSION: No acute intracranial injury.  No calvarial fracture. Remote infarcts within the a occipital cortices bilaterally again noted. Interval development of remote infarcts within the left parietal lobe and periventricular white matter. Stable senescent change. No acute fracture or listhesis  of the cervical spine. Advanced multilevel degenerative disc and degenerative joint disease, as described above, resulting in multilevel mild-to-moderate neuroforaminal narrowing, most severe bilaterally at C4-5 and on the left at C5-6. Electronically Signed   By: Fidela Salisbury MD   On: 08/02/2020 03:01   CT CERVICAL SPINE WO CONTRAST  Result Date: 08/02/2020 CLINICAL DATA:  Fall, facial trauma, scalp laceration, dizziness EXAM: CT HEAD WITHOUT CONTRAST CT CERVICAL SPINE WITHOUT CONTRAST TECHNIQUE: Multidetector CT imaging of the head and cervical spine was performed following the standard protocol without intravenous contrast. Multiplanar CT image reconstructions of the cervical spine were also generated. COMPARISON:  07/15/2019 FINDINGS: CT HEAD FINDINGS Brain: Remote left parietal, left periventricular, and bilateral occipital cortical infarcts are identified with interval development of the periventricular and parietal infarcts since prior examination. Mild parenchymal volume loss is stable in commensurate with the patient's age. Mild periventricular white matter changes are again noted most in keeping with small vessel ischemia. No evidence of acute intracranial hemorrhage or infarct. No abnormal mass effect or midline shift. No abnormal intra or extra-axial mass lesion or fluid collection. Cerebellum is unremarkable. Ventricular size is normal. Vascular: No asymmetric hyperdense vasculature at the skull base. Skull: Intact Sinuses/Orbits: The visualized paranasal sinuses are clear. Orbits are unremarkable. Other: Mastoid air cells and middle ear cavities are clear. Mild left supraorbital soft tissue swelling. CT CERVICAL SPINE FINDINGS Alignment: There is stable physiologic 2-3 mm anterolisthesis of C3 upon C4 and C4 upon C5. Ankylosis of the left C6-7 facet. Skull base and vertebrae: The craniocervical alignment is normal. Severe degenerative arthritis involving the a right atlantoaxial articulation and  the atlantodental articulation are again identified. The atlantodental interval is not widened. There is no acute fracture or traumatic listhesis of the cervical spine. Vertebral body heights have been preserved Soft tissues and spinal canal: No prevertebral fluid or swelling. No visible canal hematoma. Disc levels: There is marked intervertebral disc space narrowing and endplate remodeling at Q4-6 and C6-7 in keeping with changes of severe degenerative disc disease, similar to that noted on  prior examination. Moderate degenerative disc disease is noted at C4-5, similar to prior exam. Remaining intervertebral disc heights are preserved. Review of the axial images demonstrates multilevel uncovertebral and facet arthrosis resulting in multilevel mild-to-moderate neuroforaminal narrowing, most severe on the right at C4-5 in on the left at C4-5 and C5-6. No significant canal stenosis. Upper chest: Unremarkable Other: None IMPRESSION: No acute intracranial injury.  No calvarial fracture. Remote infarcts within the a occipital cortices bilaterally again noted. Interval development of remote infarcts within the left parietal lobe and periventricular white matter. Stable senescent change. No acute fracture or listhesis of the cervical spine. Advanced multilevel degenerative disc and degenerative joint disease, as described above, resulting in multilevel mild-to-moderate neuroforaminal narrowing, most severe bilaterally at C4-5 and on the left at C5-6. Electronically Signed   By: Fidela Salisbury MD   On: 08/02/2020 03:01   DG Hip Unilat With Pelvis 2-3 Views Right  Result Date: 08/02/2020 CLINICAL DATA:  87 year old female with fall and right hip pain. EXAM: DG HIP (WITH OR WITHOUT PELVIS) 2-3V RIGHT COMPARISON:  Pelvic radiograph dated 07/15/2019 FINDINGS: There is no acute fracture or dislocation. The bones are osteopenic. Mild bilateral hip arthritic changes. There is degenerative changes of the lower lumbar spine. The  soft tissues are unremarkable. Vascular calcifications noted. IMPRESSION: No acute fracture or dislocation. Electronically Signed   By: Anner Crete M.D.   On: 08/02/2020 17:16    Assessment/Plan Compression fracture of body of thoracic vertebra and Lumber vertebrae Pain Controlled on Tramadol and Tylenol Calcium and Vit D  Cerebrovascular accident (CVA), unspecified mechanism ([place]) Infarcts seen on CT scan done in ED after her fall She seems to be doing well right now On aspirin now Allergic to statin  Vascular dementia without behavioral disturbance ([place]) Significant change in her MMSE recently with increase confusion Supportive care and Namenda Primary hypertension On Lisinopril Stage 3a chronic kidney disease ([place]) stable Hypothyroidism, unspecified type TSH normal in 4/22 Gout On Allopurinol  Family/ staff Communication: d/w her POA Nephew  Labs/tests ordered:

## 2020-09-19 ENCOUNTER — Other Ambulatory Visit: Payer: Self-pay | Admitting: *Deleted

## 2020-09-19 DIAGNOSIS — R1310 Dysphagia, unspecified: Secondary | ICD-10-CM | POA: Diagnosis not present

## 2020-09-19 DIAGNOSIS — R29898 Other symptoms and signs involving the musculoskeletal system: Secondary | ICD-10-CM | POA: Diagnosis not present

## 2020-09-19 DIAGNOSIS — M5459 Other low back pain: Secondary | ICD-10-CM | POA: Diagnosis not present

## 2020-09-19 DIAGNOSIS — C679 Malignant neoplasm of bladder, unspecified: Secondary | ICD-10-CM | POA: Diagnosis not present

## 2020-09-19 DIAGNOSIS — M6281 Muscle weakness (generalized): Secondary | ICD-10-CM | POA: Diagnosis not present

## 2020-09-19 DIAGNOSIS — F0390 Unspecified dementia without behavioral disturbance: Secondary | ICD-10-CM | POA: Diagnosis not present

## 2020-09-19 DIAGNOSIS — R2681 Unsteadiness on feet: Secondary | ICD-10-CM | POA: Diagnosis not present

## 2020-09-19 DIAGNOSIS — I129 Hypertensive chronic kidney disease with stage 1 through stage 4 chronic kidney disease, or unspecified chronic kidney disease: Secondary | ICD-10-CM | POA: Diagnosis not present

## 2020-09-19 DIAGNOSIS — R1312 Dysphagia, oropharyngeal phase: Secondary | ICD-10-CM | POA: Diagnosis not present

## 2020-09-19 DIAGNOSIS — Z9181 History of falling: Secondary | ICD-10-CM | POA: Diagnosis not present

## 2020-09-19 DIAGNOSIS — R41841 Cognitive communication deficit: Secondary | ICD-10-CM | POA: Diagnosis not present

## 2020-09-19 NOTE — Telephone Encounter (Signed)
Received faxes from [facility] and [facility] requesting Rx for Tramadol  Medication is NOT in current medication list.  Added and sent to Dr. Lyndel Safe for approval.

## 2020-09-20 DIAGNOSIS — R2681 Unsteadiness on feet: Secondary | ICD-10-CM | POA: Diagnosis not present

## 2020-09-20 DIAGNOSIS — R29898 Other symptoms and signs involving the musculoskeletal system: Secondary | ICD-10-CM | POA: Diagnosis not present

## 2020-09-20 DIAGNOSIS — M5459 Other low back pain: Secondary | ICD-10-CM | POA: Diagnosis not present

## 2020-09-20 DIAGNOSIS — R41841 Cognitive communication deficit: Secondary | ICD-10-CM | POA: Diagnosis not present

## 2020-09-20 DIAGNOSIS — M6281 Muscle weakness (generalized): Secondary | ICD-10-CM | POA: Diagnosis not present

## 2020-09-20 DIAGNOSIS — Z9181 History of falling: Secondary | ICD-10-CM | POA: Diagnosis not present

## 2020-09-21 MED ORDER — TRAMADOL HCL 50 MG PO TABS: 50.0000 mg | ORAL_TABLET | Freq: Three times a day (TID) | ORAL | 0 refills | Status: DC

## 2020-09-22 DIAGNOSIS — M6281 Muscle weakness (generalized): Secondary | ICD-10-CM | POA: Diagnosis not present

## 2020-09-22 DIAGNOSIS — M5459 Other low back pain: Secondary | ICD-10-CM | POA: Diagnosis not present

## 2020-09-22 DIAGNOSIS — Z9181 History of falling: Secondary | ICD-10-CM | POA: Diagnosis not present

## 2020-09-22 DIAGNOSIS — R41841 Cognitive communication deficit: Secondary | ICD-10-CM | POA: Diagnosis not present

## 2020-09-22 DIAGNOSIS — R29898 Other symptoms and signs involving the musculoskeletal system: Secondary | ICD-10-CM | POA: Diagnosis not present

## 2020-09-22 DIAGNOSIS — R2681 Unsteadiness on feet: Secondary | ICD-10-CM | POA: Diagnosis not present

## 2020-09-23 DIAGNOSIS — M5459 Other low back pain: Secondary | ICD-10-CM | POA: Diagnosis not present

## 2020-09-23 DIAGNOSIS — Z9181 History of falling: Secondary | ICD-10-CM | POA: Diagnosis not present

## 2020-09-23 DIAGNOSIS — R2681 Unsteadiness on feet: Secondary | ICD-10-CM | POA: Diagnosis not present

## 2020-09-23 DIAGNOSIS — M6281 Muscle weakness (generalized): Secondary | ICD-10-CM | POA: Diagnosis not present

## 2020-09-23 DIAGNOSIS — R41841 Cognitive communication deficit: Secondary | ICD-10-CM | POA: Diagnosis not present

## 2020-09-23 DIAGNOSIS — R29898 Other symptoms and signs involving the musculoskeletal system: Secondary | ICD-10-CM | POA: Diagnosis not present

## 2020-09-24 ENCOUNTER — Encounter: Payer: Self-pay | Admitting: Nurse Practitioner

## 2020-09-24 ENCOUNTER — Non-Acute Institutional Stay (SKILLED_NURSING_FACILITY): Payer: Medicare Other | Admitting: Nurse Practitioner

## 2020-09-24 DIAGNOSIS — E039 Hypothyroidism, unspecified: Secondary | ICD-10-CM

## 2020-09-24 DIAGNOSIS — M109 Gout, unspecified: Secondary | ICD-10-CM

## 2020-09-24 DIAGNOSIS — M5030 Other cervical disc degeneration, unspecified cervical region: Secondary | ICD-10-CM | POA: Diagnosis not present

## 2020-09-24 DIAGNOSIS — M549 Dorsalgia, unspecified: Secondary | ICD-10-CM

## 2020-09-24 DIAGNOSIS — R41841 Cognitive communication deficit: Secondary | ICD-10-CM | POA: Diagnosis not present

## 2020-09-24 DIAGNOSIS — I10 Essential (primary) hypertension: Secondary | ICD-10-CM | POA: Diagnosis not present

## 2020-09-24 DIAGNOSIS — N1831 Chronic kidney disease, stage 3a: Secondary | ICD-10-CM

## 2020-09-24 DIAGNOSIS — F0150 Vascular dementia without behavioral disturbance: Secondary | ICD-10-CM

## 2020-09-24 DIAGNOSIS — Z8673 Personal history of transient ischemic attack (TIA), and cerebral infarction without residual deficits: Secondary | ICD-10-CM

## 2020-09-24 DIAGNOSIS — R29898 Other symptoms and signs involving the musculoskeletal system: Secondary | ICD-10-CM | POA: Diagnosis not present

## 2020-09-24 DIAGNOSIS — R2681 Unsteadiness on feet: Secondary | ICD-10-CM | POA: Diagnosis not present

## 2020-09-24 DIAGNOSIS — G459 Transient cerebral ischemic attack, unspecified: Secondary | ICD-10-CM | POA: Diagnosis not present

## 2020-09-24 DIAGNOSIS — Z9181 History of falling: Secondary | ICD-10-CM | POA: Diagnosis not present

## 2020-09-24 DIAGNOSIS — M5459 Other low back pain: Secondary | ICD-10-CM | POA: Diagnosis not present

## 2020-09-24 DIAGNOSIS — M6281 Muscle weakness (generalized): Secondary | ICD-10-CM | POA: Diagnosis not present

## 2020-09-24 NOTE — Assessment & Plan Note (Signed)
DDD per CT head/cervical spine 08/02/20 Advanced multilevel degenerative disc and degenerative joint disease, as described above, resulting in multilevel mild-to-moderate neuroforaminal narrowing, most severe bilaterally at C4-5 and on the left at C5-6.

## 2020-09-24 NOTE — Progress Notes (Signed)
Location:   SNF [place] Room Number: N040 Place of Service:  SNF (31) Provider: Lennie Odor Laurieanne Galloway NP  Virgie Dad, MD  Patient Care Team: Virgie Dad, MD as PCP - General (Internal Medicine) Ashvin Adelson X, NP as Nurse Practitioner (Internal Medicine) Virgie Dad, MD as Consulting Physician (Internal Medicine)  Extended Emergency Contact Information Primary Emergency Contact: Malczewski,[name]  Johnnette Litter of [place] Phone: [phone] Relation: Son  Code Status: DNR Goals of care: Advanced Directive information Advanced Directives 09/24/2020  Does Patient Have a Medical Advance Directive? Yes  Type of Advance Directive [place]  Does patient want to make changes to medical advance directive? No - Patient declined  Copy of [place] in Chart? Yes - validated most recent copy scanned in chart (See row information)  Would patient like information on creating a medical advance directive? -  Pre-existing out of facility DNR order (yellow form or pink MOST form) -     Chief Complaint  Patient presents with   Acute Visit    Patient presents after a fall     HPI:  Pt is a 87 y.o. female seen today for an acute visit for reported 3 days ago 09/21/20 the patient fell when the patient found lying on floor at foot of bed, TIA like symptoms-not speaking or following commands for about 10 minutes, then slurred speech for about 15 minutes, c/o dizziness, all resolved w/o intervention. No facial or limb weakness. The patient is at her usual state of health today. Taking ASA already. Hx of TIA. Allergic to statin.  HPOA desired not hospital evaluation   The lower portion of th mid back pain, T5, T8, L3 compression fxs. Takes Tramadol for pain.              R hip pain, not complained today. X-[place] R hip/pelvis 08/02/20 showed no acute fx or dislocation             Hx of CVA per CT head 08/02/20 Remote infarcts within the a occipital cortices  bilaterally again noted. Interval development of remote infarcts within the left parietal lobe and periventricular white matter.             DDD per CT head/cervical spine 08/02/20 Advanced multilevel degenerative disc and degenerative joint disease, as described above, resulting in multilevel mild-to-moderate neuroforaminal narrowing, most severe bilaterally at C4-5 and on the left at C5-6.             Vascular dementia, MMSE dropped from 01/02/19 29/30 to11/3/21 MMSE 18/30, failed clock drawing. 07/15/19 CT head Mild generalized parenchymal atrophy and chronic small vessel ischemic disease, tolerated Memantine well.                          HTN, blood pressure is controlled on Benazepril 48m qd. Bun/creat 26/1.08 eGFR 44 08/02/20             CKD Bun/creat 26/1.08 eGFR 44 08/02/20             Gout stable, on Allopurinol 1074mqd.                        Hypothyroidism, stable, on Levothyroxine. TSH 2.34 06/22/20                               Past Medical History:  Diagnosis Date   Cognitive changes    Dementia ([place])    Depression    Gout    Hypertension    Hypothyroidism    Peripheral neuropathy    History reviewed. No pertinent surgical history.  Allergies  Allergen Reactions   Actonel [Risedronate Sodium]    Hct [Hydrochlorothiazide]    Lipitor [Atorvastatin Calcium]    Neomycin Other (See Comments)    Unknown "been so long ago, a Dermatologist told me"   Polysporin [Bacitracin-Polymyxin B]    Prolia [Denosumab]    Sulfa Antibiotics    Zocor [Simvastatin]     Allergies as of 09/24/2020       Reactions   Actonel [risedronate Sodium]    Hct [hydrochlorothiazide]    Lipitor [atorvastatin Calcium]    Neomycin Other (See Comments)   Unknown "been so long ago, a Dermatologist told me"   Polysporin [bacitracin-polymyxin B]    Prolia [denosumab]    Sulfa Antibiotics    Zocor [simvastatin]         Medication List        Accurate as of September 24, 2020 11:59 PM. If you  have any questions, ask your nurse or doctor.          acetaminophen 325 MG tablet Commonly known as: TYLENOL Take 650 mg by mouth in the morning, at noon, and at bedtime.   Alfalfa 500 MG Tabs Take 500 mg by mouth daily.   allopurinol 100 MG tablet Commonly known as: ZYLOPRIM Take 100 mg by mouth daily.   ascorbic acid 1000 MG tablet Commonly known as: VITAMIN C Take 1,000 mg by mouth daily.   aspirin 81 MG chewable tablet Chew 81 mg by mouth daily.   benazepril 10 MG tablet Commonly known as: LOTENSIN Take 10 mg by mouth daily.   Calcium Carb-Cholecalciferol 500-400 MG-UNIT Tabs Take 2 tablets by mouth daily.   cholecalciferol 25 MCG (1000 UNIT) tablet Commonly known as: VITAMIN D3 Take 1,000 Units by mouth 2 (two) times daily.   colchicine 0.6 MG tablet Take 0.6 mg by mouth 2 (two) times daily.   CoQ10 100 MG Caps Take 2 capsules by mouth daily.   levothyroxine 100 MCG tablet Commonly known as: SYNTHROID Take 100 mcg by mouth once a week. On Wednesday   levothyroxine 50 MCG tablet Commonly known as: SYNTHROID Take 50 mcg by mouth daily before breakfast. Sun,Mon,Tue, Thur,Fri,Sat   memantine 10 MG tablet Commonly known as: NAMENDA Take 10 mg by mouth 2 (two) times daily.   Omega-3 1000 MG Caps Take 2 capsules by mouth 2 (two) times daily.   pregabalin 50 MG capsule Commonly known as: LYRICA Take 50 mg by mouth 2 (two) times daily.   traMADol 50 MG tablet Commonly known as: ULTRAM Take 1 tablet (50 mg total) by mouth 3 (three) times daily with meals.        Review of Systems  Constitutional:  Negative for appetite change, fatigue and fever.       Improving from yesterday.   HENT:  Positive for hearing loss. Negative for congestion and voice change.   Eyes:  Negative for visual disturbance.  Respiratory:  Negative for shortness of breath.   Cardiovascular:  Negative for leg swelling.  Gastrointestinal:  Negative for abdominal pain and  constipation.  Genitourinary:  Negative for difficulty urinating, dysuria and urgency.  Musculoskeletal:  Positive for arthralgias, back pain and gait problem.       Positional lower  portion of the  mid back pain. No c/o neck, right hip, or lower back pain.   Skin:  Negative for color change.  Neurological:  Negative for dizziness, speech difficulty, weakness and headaches.       Memory lapses. Dizziness when getting out of bed, but not with head turning.   Psychiatric/Behavioral:  Positive for confusion. Negative for behavioral problems and sleep disturbance. The patient is not nervous/anxious.    Immunization History  Administered Date(s) Administered   Influenza, High Dose Seasonal PF 12/11/2018   Influenza-Unspecified 03/24/2018, 12/21/2019   Moderna Sars-Covid-2 Vaccination 03/15/2019, 04/21/2019, 01/17/2020, 08/07/2020   Tdap 07/15/2019   Pertinent  Health Maintenance Due  Topic Date Due   DEXA SCAN  Never done   PNA vac Low Risk Adult (1 of 2 - PCV13) Never done   INFLUENZA VACCINE  10/08/2020   No flowsheet data found. Functional Status Survey:    Vitals:   09/24/20 1600  BP: 133/72  Pulse: 77  Resp: 18  Temp: (!) 97.2 F (36.2 C)  SpO2: 98%  Weight: 138 lb 9.6 oz (62.9 kg)  Height: '5\' 1"'  (1.549 m)   Body mass index is 26.19 kg/m. Physical Exam Vitals and nursing note reviewed.  Constitutional:      Appearance: Normal appearance.  HENT:     Head: Normocephalic and atraumatic.     Mouth/Throat:     Mouth: Mucous membranes are moist.  Eyes:     Extraocular Movements: Extraocular movements intact.     Conjunctiva/sclera: Conjunctivae normal.     Pupils: Pupils are equal, round, and reactive to light.  Cardiovascular:     Rate and Rhythm: Normal rate and regular rhythm.     Heart sounds: No murmur heard. Pulmonary:     Effort: Pulmonary effort is normal.     Breath sounds: No rales.  Abdominal:     General: Bowel sounds are normal.     Palpations:  Abdomen is soft.     Tenderness: There is no abdominal tenderness.  Musculoskeletal:        General: Tenderness present.     Cervical back: Normal range of motion and neck supple.     Right lower leg: No edema.     Left lower leg: No edema.     Comments: no spinal spinous process tenderness palpated. C/o lower portion of the mid back pain with position changing.   Skin:    General: Skin is warm and dry.  Neurological:     General: No focal deficit present.     Mental Status: She is alert. Mental status is at baseline.     Motor: No weakness.     Coordination: Coordination normal.     Gait: Gait abnormal.  Psychiatric:        Mood and Affect: Mood normal.        Behavior: Behavior normal.    Labs reviewed: Recent Labs    12/28/19 0000 01/11/20 0000 06/22/20 0011  NA 141 140 142  K 4.1 4.1 3.8  CL 107 106 106  CO2 25* 25* 25*  BUN 23* 23* 25*  CREATININE 1.0 1.2* 1.2*  CALCIUM 9.1 9.4 9.4   Recent Labs    12/28/19 0000 01/11/20 0000 06/22/20 0011  AST '16 17 17  ' ALT '12 10 12  ' ALKPHOS 84 78 81  ALBUMIN 3.7 3.7 4.1   Recent Labs    12/28/19 0000 01/11/20 0000 06/22/20 0011  WBC 8.4 7.8 8.7  NEUTROABS 6,065.00  --   --  HGB 13.4 13.2 13.9  HCT 40 39 42  PLT 220 214 209   Lab Results  Component Value Date   TSH 2.34 06/22/2020   No results found for: HGBA1C No results found for: CHOL, HDL, LDLCALC, LDLDIRECT, TRIG, CHOLHDL  Significant Diagnostic Results in last 30 days:  No results found.  Assessment/Plan: TIA (transient ischemic attack) reported 3 days ago 09/21/20 the patient fell when the patient found lying on floor at foot of bed, TIA like symptoms-not speaking or following commands for about 10 minutes, then slurred speech for about 15 minutes, c/o dizziness, all resolved w/o intervention. No facial or limb weakness. The patient is at her usual state of health today. Taking ASA already. Hx of TIA. Allergic to statin.  HPOA desired not hospital  evaluation. May Neurology evaluation if HPOA desires.   Mid back pain The lower portion of th mid back pain, T5, T8, L3 compression fxs. Continue Tramadol for pain.   History of CVA (cerebrovascular accident) Hx of CVA per CT head 08/02/20 Remote infarcts within the a occipital cortices bilaterally again noted. Interval development of remote infarcts within the left parietal lobe and periventricular white matter.  DDD (degenerative disc disease), cervical DDD per CT head/cervical spine 08/02/20 Advanced multilevel degenerative disc and degenerative joint disease, as described above, resulting in multilevel mild-to-moderate neuroforaminal narrowing, most severe bilaterally at C4-5 and on the left at C5-6.  Vascular dementia [facility]) Vascular dementia, MMSE dropped from 01/02/19 29/30 to11/3/21 MMSE 18/30, failed clock drawing. 07/15/19 CT head Mild generalized parenchymal atrophy and chronic small vessel ischemic disease, tolerated Memantine well.      HTN (hypertension) blood pressure is controlled on Benazepril 3m qd. Bun/creat 26/1.08 eGFR 44 08/02/20  CKD (chronic kidney disease) stage 3, GFR 30-59 ml/min (HCC) Bun/creat 26/1.08 eGFR 44 08/02/20  Gout  on Allopurinol 104mqd.      Hypothyroidism stable, on Levothyroxine. TSH 2.34 06/22/20    Family/ staff Communication: plan of care reviewed with the patient and charge nurse.   Labs/tests ordered: none  Time spend 35 minutes.

## 2020-09-24 NOTE — Assessment & Plan Note (Signed)
Vascular dementia, MMSE dropped from 01/02/19 29/30 to11/3/21 MMSE 18/30, failed clock drawing. 07/15/19 CT head Mild generalized parenchymal atrophy and chronic small vessel ischemic disease, tolerated Memantine well.

## 2020-09-24 NOTE — Assessment & Plan Note (Signed)
on Allopurinol 100mg  qd.

## 2020-09-24 NOTE — Assessment & Plan Note (Signed)
Bun/creat 26/1.08 eGFR 44 08/02/20

## 2020-09-24 NOTE — Assessment & Plan Note (Signed)
stable, on Levothyroxine. TSH 2.34 06/22/20

## 2020-09-24 NOTE — Assessment & Plan Note (Signed)
blood pressure is controlled on Benazepril 10mg qd. Bun/creat26/1.08 eGFR 44 08/02/20  

## 2020-09-24 NOTE — Assessment & Plan Note (Addendum)
The lower portion of th mid back pain, T5, T8, L3 compression fxs. Continue Tramadol for pain.

## 2020-09-24 NOTE — Assessment & Plan Note (Signed)
Hx of CVA per CT head 08/02/20 Remote infarcts within the a occipital cortices bilaterally again noted. Interval development of remote infarcts within the left parietal lobe and periventricular white matter.

## 2020-09-24 NOTE — Assessment & Plan Note (Signed)
reported 3 days ago 09/21/20 the patient fell when the patient found lying on floor at foot of bed, TIA like symptoms-not speaking or following commands for about 10 minutes, then slurred speech for about 15 minutes, c/o dizziness, all resolved w/o intervention. No facial or limb weakness. The patient is at her usual state of health today. Taking ASA already. Hx of TIA. Allergic to statin.  HPOA desired not hospital evaluation. May Neurology evaluation if HPOA desires.

## 2020-09-25 ENCOUNTER — Encounter: Payer: Self-pay | Admitting: Nurse Practitioner

## 2020-09-25 ENCOUNTER — Telehealth: Payer: Self-pay | Admitting: Internal Medicine

## 2020-09-25 DIAGNOSIS — E039 Hypothyroidism, unspecified: Secondary | ICD-10-CM | POA: Diagnosis not present

## 2020-09-25 DIAGNOSIS — M6281 Muscle weakness (generalized): Secondary | ICD-10-CM | POA: Diagnosis not present

## 2020-09-25 DIAGNOSIS — R2681 Unsteadiness on feet: Secondary | ICD-10-CM | POA: Diagnosis not present

## 2020-09-25 DIAGNOSIS — Z9181 History of falling: Secondary | ICD-10-CM | POA: Diagnosis not present

## 2020-09-25 DIAGNOSIS — R29898 Other symptoms and signs involving the musculoskeletal system: Secondary | ICD-10-CM | POA: Diagnosis not present

## 2020-09-25 DIAGNOSIS — M5459 Other low back pain: Secondary | ICD-10-CM | POA: Diagnosis not present

## 2020-09-25 DIAGNOSIS — R41841 Cognitive communication deficit: Secondary | ICD-10-CM | POA: Diagnosis not present

## 2020-09-25 DIAGNOSIS — I10 Essential (primary) hypertension: Secondary | ICD-10-CM | POA: Diagnosis not present

## 2020-09-25 LAB — BASIC METABOLIC PANEL
BUN: 16 (ref 4–21)
CO2: 26 — AB (ref 13–22)
Chloride: 106 (ref 99–108)
Creatinine: 0.9 (ref 0.5–1.1)
Glucose: 80
Potassium: 3.6 (ref 3.4–5.3)
Sodium: 141 (ref 137–147)

## 2020-09-25 LAB — CBC AND DIFFERENTIAL
HCT: 40 (ref 36–46)
Hemoglobin: 13.1 (ref 12.0–16.0)
Neutrophils Absolute: 6364
Platelets: 264 (ref 150–399)
WBC: 8.6

## 2020-09-25 LAB — COMPREHENSIVE METABOLIC PANEL
Albumin: 3.8 (ref 3.5–5.0)
Calcium: 9.4 (ref 8.7–10.7)
Globulin: 2.7

## 2020-09-25 LAB — HEPATIC FUNCTION PANEL
ALT: 11 (ref 7–35)
AST: 15 (ref 13–35)
Alkaline Phosphatase: 101 (ref 25–125)
Bilirubin, Total: 0.5

## 2020-09-25 LAB — CBC: RBC: 4.15 (ref 3.87–5.11)

## 2020-09-25 NOTE — Telephone Encounter (Signed)
Her POA wanted to talk about referal to Neurology and TIA.  He is not sure if he wants her to go anywhere as Mrs Ringle doe snot want anything Aggressive Will start her on Aspirin 325 mg QD for 3 weeks and then baby aspirin. No further testing or Referal for now. Follow closely and will tell Social worker to discuss MOST form with them

## 2020-09-26 DIAGNOSIS — M6281 Muscle weakness (generalized): Secondary | ICD-10-CM | POA: Diagnosis not present

## 2020-09-26 DIAGNOSIS — R2681 Unsteadiness on feet: Secondary | ICD-10-CM | POA: Diagnosis not present

## 2020-09-26 DIAGNOSIS — R29898 Other symptoms and signs involving the musculoskeletal system: Secondary | ICD-10-CM | POA: Diagnosis not present

## 2020-09-26 DIAGNOSIS — Z9181 History of falling: Secondary | ICD-10-CM | POA: Diagnosis not present

## 2020-09-26 DIAGNOSIS — M5459 Other low back pain: Secondary | ICD-10-CM | POA: Diagnosis not present

## 2020-09-26 DIAGNOSIS — R41841 Cognitive communication deficit: Secondary | ICD-10-CM | POA: Diagnosis not present

## 2020-09-27 DIAGNOSIS — R29898 Other symptoms and signs involving the musculoskeletal system: Secondary | ICD-10-CM | POA: Diagnosis not present

## 2020-09-27 DIAGNOSIS — Z9181 History of falling: Secondary | ICD-10-CM | POA: Diagnosis not present

## 2020-09-27 DIAGNOSIS — M6281 Muscle weakness (generalized): Secondary | ICD-10-CM | POA: Diagnosis not present

## 2020-09-27 DIAGNOSIS — R41841 Cognitive communication deficit: Secondary | ICD-10-CM | POA: Diagnosis not present

## 2020-09-27 DIAGNOSIS — M5459 Other low back pain: Secondary | ICD-10-CM | POA: Diagnosis not present

## 2020-09-27 DIAGNOSIS — R2681 Unsteadiness on feet: Secondary | ICD-10-CM | POA: Diagnosis not present

## 2020-09-28 DIAGNOSIS — M6281 Muscle weakness (generalized): Secondary | ICD-10-CM | POA: Diagnosis not present

## 2020-09-28 DIAGNOSIS — R29898 Other symptoms and signs involving the musculoskeletal system: Secondary | ICD-10-CM | POA: Diagnosis not present

## 2020-09-28 DIAGNOSIS — Z9181 History of falling: Secondary | ICD-10-CM | POA: Diagnosis not present

## 2020-09-28 DIAGNOSIS — R2681 Unsteadiness on feet: Secondary | ICD-10-CM | POA: Diagnosis not present

## 2020-09-28 DIAGNOSIS — M5459 Other low back pain: Secondary | ICD-10-CM | POA: Diagnosis not present

## 2020-09-28 DIAGNOSIS — R41841 Cognitive communication deficit: Secondary | ICD-10-CM | POA: Diagnosis not present

## 2020-09-29 DIAGNOSIS — R2681 Unsteadiness on feet: Secondary | ICD-10-CM | POA: Diagnosis not present

## 2020-09-29 DIAGNOSIS — Z9181 History of falling: Secondary | ICD-10-CM | POA: Diagnosis not present

## 2020-09-29 DIAGNOSIS — R41841 Cognitive communication deficit: Secondary | ICD-10-CM | POA: Diagnosis not present

## 2020-09-29 DIAGNOSIS — M5459 Other low back pain: Secondary | ICD-10-CM | POA: Diagnosis not present

## 2020-09-29 DIAGNOSIS — M6281 Muscle weakness (generalized): Secondary | ICD-10-CM | POA: Diagnosis not present

## 2020-09-29 DIAGNOSIS — R29898 Other symptoms and signs involving the musculoskeletal system: Secondary | ICD-10-CM | POA: Diagnosis not present

## 2020-09-30 DIAGNOSIS — M5459 Other low back pain: Secondary | ICD-10-CM | POA: Diagnosis not present

## 2020-09-30 DIAGNOSIS — R29898 Other symptoms and signs involving the musculoskeletal system: Secondary | ICD-10-CM | POA: Diagnosis not present

## 2020-09-30 DIAGNOSIS — R41841 Cognitive communication deficit: Secondary | ICD-10-CM | POA: Diagnosis not present

## 2020-09-30 DIAGNOSIS — R2681 Unsteadiness on feet: Secondary | ICD-10-CM | POA: Diagnosis not present

## 2020-09-30 DIAGNOSIS — M6281 Muscle weakness (generalized): Secondary | ICD-10-CM | POA: Diagnosis not present

## 2020-09-30 DIAGNOSIS — Z9181 History of falling: Secondary | ICD-10-CM | POA: Diagnosis not present

## 2020-10-01 DIAGNOSIS — R29898 Other symptoms and signs involving the musculoskeletal system: Secondary | ICD-10-CM | POA: Diagnosis not present

## 2020-10-01 DIAGNOSIS — R2681 Unsteadiness on feet: Secondary | ICD-10-CM | POA: Diagnosis not present

## 2020-10-01 DIAGNOSIS — M6281 Muscle weakness (generalized): Secondary | ICD-10-CM | POA: Diagnosis not present

## 2020-10-01 DIAGNOSIS — M5459 Other low back pain: Secondary | ICD-10-CM | POA: Diagnosis not present

## 2020-10-01 DIAGNOSIS — R41841 Cognitive communication deficit: Secondary | ICD-10-CM | POA: Diagnosis not present

## 2020-10-01 DIAGNOSIS — Z9181 History of falling: Secondary | ICD-10-CM | POA: Diagnosis not present

## 2020-10-02 DIAGNOSIS — R29898 Other symptoms and signs involving the musculoskeletal system: Secondary | ICD-10-CM | POA: Diagnosis not present

## 2020-10-02 DIAGNOSIS — M5459 Other low back pain: Secondary | ICD-10-CM | POA: Diagnosis not present

## 2020-10-02 DIAGNOSIS — R2681 Unsteadiness on feet: Secondary | ICD-10-CM | POA: Diagnosis not present

## 2020-10-02 DIAGNOSIS — R41841 Cognitive communication deficit: Secondary | ICD-10-CM | POA: Diagnosis not present

## 2020-10-02 DIAGNOSIS — M6281 Muscle weakness (generalized): Secondary | ICD-10-CM | POA: Diagnosis not present

## 2020-10-02 DIAGNOSIS — Z9181 History of falling: Secondary | ICD-10-CM | POA: Diagnosis not present

## 2020-10-03 DIAGNOSIS — R29898 Other symptoms and signs involving the musculoskeletal system: Secondary | ICD-10-CM | POA: Diagnosis not present

## 2020-10-03 DIAGNOSIS — M6281 Muscle weakness (generalized): Secondary | ICD-10-CM | POA: Diagnosis not present

## 2020-10-03 DIAGNOSIS — R2681 Unsteadiness on feet: Secondary | ICD-10-CM | POA: Diagnosis not present

## 2020-10-03 DIAGNOSIS — Z9181 History of falling: Secondary | ICD-10-CM | POA: Diagnosis not present

## 2020-10-03 DIAGNOSIS — R41841 Cognitive communication deficit: Secondary | ICD-10-CM | POA: Diagnosis not present

## 2020-10-03 DIAGNOSIS — M5459 Other low back pain: Secondary | ICD-10-CM | POA: Diagnosis not present

## 2020-10-04 DIAGNOSIS — M5459 Other low back pain: Secondary | ICD-10-CM | POA: Diagnosis not present

## 2020-10-04 DIAGNOSIS — R41841 Cognitive communication deficit: Secondary | ICD-10-CM | POA: Diagnosis not present

## 2020-10-04 DIAGNOSIS — R29898 Other symptoms and signs involving the musculoskeletal system: Secondary | ICD-10-CM | POA: Diagnosis not present

## 2020-10-04 DIAGNOSIS — R2681 Unsteadiness on feet: Secondary | ICD-10-CM | POA: Diagnosis not present

## 2020-10-04 DIAGNOSIS — M6281 Muscle weakness (generalized): Secondary | ICD-10-CM | POA: Diagnosis not present

## 2020-10-04 DIAGNOSIS — Z9181 History of falling: Secondary | ICD-10-CM | POA: Diagnosis not present

## 2020-10-05 DIAGNOSIS — M5459 Other low back pain: Secondary | ICD-10-CM | POA: Diagnosis not present

## 2020-10-05 DIAGNOSIS — R41841 Cognitive communication deficit: Secondary | ICD-10-CM | POA: Diagnosis not present

## 2020-10-05 DIAGNOSIS — Z9181 History of falling: Secondary | ICD-10-CM | POA: Diagnosis not present

## 2020-10-05 DIAGNOSIS — R29898 Other symptoms and signs involving the musculoskeletal system: Secondary | ICD-10-CM | POA: Diagnosis not present

## 2020-10-05 DIAGNOSIS — R2681 Unsteadiness on feet: Secondary | ICD-10-CM | POA: Diagnosis not present

## 2020-10-05 DIAGNOSIS — M6281 Muscle weakness (generalized): Secondary | ICD-10-CM | POA: Diagnosis not present

## 2020-10-06 DIAGNOSIS — R29898 Other symptoms and signs involving the musculoskeletal system: Secondary | ICD-10-CM | POA: Diagnosis not present

## 2020-10-06 DIAGNOSIS — R2681 Unsteadiness on feet: Secondary | ICD-10-CM | POA: Diagnosis not present

## 2020-10-06 DIAGNOSIS — R41841 Cognitive communication deficit: Secondary | ICD-10-CM | POA: Diagnosis not present

## 2020-10-06 DIAGNOSIS — Z9181 History of falling: Secondary | ICD-10-CM | POA: Diagnosis not present

## 2020-10-06 DIAGNOSIS — M6281 Muscle weakness (generalized): Secondary | ICD-10-CM | POA: Diagnosis not present

## 2020-10-06 DIAGNOSIS — M5459 Other low back pain: Secondary | ICD-10-CM | POA: Diagnosis not present

## 2020-10-07 DIAGNOSIS — R29898 Other symptoms and signs involving the musculoskeletal system: Secondary | ICD-10-CM | POA: Diagnosis not present

## 2020-10-07 DIAGNOSIS — Z9181 History of falling: Secondary | ICD-10-CM | POA: Diagnosis not present

## 2020-10-07 DIAGNOSIS — R41841 Cognitive communication deficit: Secondary | ICD-10-CM | POA: Diagnosis not present

## 2020-10-07 DIAGNOSIS — R2681 Unsteadiness on feet: Secondary | ICD-10-CM | POA: Diagnosis not present

## 2020-10-07 DIAGNOSIS — M6281 Muscle weakness (generalized): Secondary | ICD-10-CM | POA: Diagnosis not present

## 2020-10-07 DIAGNOSIS — M5459 Other low back pain: Secondary | ICD-10-CM | POA: Diagnosis not present

## 2020-10-09 DIAGNOSIS — Z9181 History of falling: Secondary | ICD-10-CM | POA: Diagnosis not present

## 2020-10-09 DIAGNOSIS — R1312 Dysphagia, oropharyngeal phase: Secondary | ICD-10-CM | POA: Diagnosis not present

## 2020-10-09 DIAGNOSIS — F0390 Unspecified dementia without behavioral disturbance: Secondary | ICD-10-CM | POA: Diagnosis not present

## 2020-10-09 DIAGNOSIS — R2681 Unsteadiness on feet: Secondary | ICD-10-CM | POA: Diagnosis not present

## 2020-10-09 DIAGNOSIS — R29898 Other symptoms and signs involving the musculoskeletal system: Secondary | ICD-10-CM | POA: Diagnosis not present

## 2020-10-09 DIAGNOSIS — R1310 Dysphagia, unspecified: Secondary | ICD-10-CM | POA: Diagnosis not present

## 2020-10-09 DIAGNOSIS — M6281 Muscle weakness (generalized): Secondary | ICD-10-CM | POA: Diagnosis not present

## 2020-10-09 DIAGNOSIS — M5459 Other low back pain: Secondary | ICD-10-CM | POA: Diagnosis not present

## 2020-10-09 DIAGNOSIS — I129 Hypertensive chronic kidney disease with stage 1 through stage 4 chronic kidney disease, or unspecified chronic kidney disease: Secondary | ICD-10-CM | POA: Diagnosis not present

## 2020-10-09 DIAGNOSIS — C679 Malignant neoplasm of bladder, unspecified: Secondary | ICD-10-CM | POA: Diagnosis not present

## 2020-10-09 DIAGNOSIS — R41841 Cognitive communication deficit: Secondary | ICD-10-CM | POA: Diagnosis not present

## 2020-10-10 ENCOUNTER — Encounter: Payer: Self-pay | Admitting: Nurse Practitioner

## 2020-10-10 ENCOUNTER — Non-Acute Institutional Stay (SKILLED_NURSING_FACILITY): Payer: Medicare Other | Admitting: Nurse Practitioner

## 2020-10-10 DIAGNOSIS — I129 Hypertensive chronic kidney disease with stage 1 through stage 4 chronic kidney disease, or unspecified chronic kidney disease: Secondary | ICD-10-CM | POA: Diagnosis not present

## 2020-10-10 DIAGNOSIS — I10 Essential (primary) hypertension: Secondary | ICD-10-CM | POA: Diagnosis not present

## 2020-10-10 DIAGNOSIS — E039 Hypothyroidism, unspecified: Secondary | ICD-10-CM

## 2020-10-10 DIAGNOSIS — F0390 Unspecified dementia without behavioral disturbance: Secondary | ICD-10-CM | POA: Diagnosis not present

## 2020-10-10 DIAGNOSIS — F0150 Vascular dementia without behavioral disturbance: Secondary | ICD-10-CM | POA: Diagnosis not present

## 2020-10-10 DIAGNOSIS — M5459 Other low back pain: Secondary | ICD-10-CM | POA: Diagnosis not present

## 2020-10-10 DIAGNOSIS — Z8673 Personal history of transient ischemic attack (TIA), and cerebral infarction without residual deficits: Secondary | ICD-10-CM

## 2020-10-10 DIAGNOSIS — M5030 Other cervical disc degeneration, unspecified cervical region: Secondary | ICD-10-CM

## 2020-10-10 DIAGNOSIS — R1312 Dysphagia, oropharyngeal phase: Secondary | ICD-10-CM | POA: Diagnosis not present

## 2020-10-10 DIAGNOSIS — G459 Transient cerebral ischemic attack, unspecified: Secondary | ICD-10-CM | POA: Diagnosis not present

## 2020-10-10 DIAGNOSIS — N1831 Chronic kidney disease, stage 3a: Secondary | ICD-10-CM | POA: Diagnosis not present

## 2020-10-10 DIAGNOSIS — R41841 Cognitive communication deficit: Secondary | ICD-10-CM | POA: Diagnosis not present

## 2020-10-10 DIAGNOSIS — R1310 Dysphagia, unspecified: Secondary | ICD-10-CM | POA: Diagnosis not present

## 2020-10-10 DIAGNOSIS — M109 Gout, unspecified: Secondary | ICD-10-CM

## 2020-10-10 NOTE — Assessment & Plan Note (Signed)
T5, T8, L3 compression fxs. Takes, Tylenol, Tramadol for pain. DDD per CT head/cervical spine 08/02/20

## 2020-10-10 NOTE — Progress Notes (Signed)
Location:   [place] Room Number: N040 Place of Service:  SNF (31) Provider:  Olukemi Panchal Otho Darner, NP  Virgie Dad, MD  Patient Care Team: Virgie Dad, MD as PCP - General (Internal Medicine) Leveon Pelzer X, NP as Nurse Practitioner (Internal Medicine) Virgie Dad, MD as Consulting Physician (Internal Medicine)  Extended Emergency Contact Information Primary Emergency Contact: Mauceri,[name]  Johnnette Litter of [place] Phone: [phone] Relation: Son  Code Status:  DNR Goals of care: Advanced Directive information Advanced Directives 10/10/2020  Does Patient Have a Medical Advance Directive? Yes  Type of Advance Directive [place]  Does patient want to make changes to medical advance directive? No - Patient declined  Copy of [place] in Chart? Yes - validated most recent copy scanned in chart (See row information)  Would patient like information on creating a medical advance directive? -  Pre-existing out of facility DNR order (yellow form or pink MOST form) -     Chief Complaint  Patient presents with   Medical Management of Chronic Issues    Routine follow up    Health Maintenance    Discuss need for shingles vaccine, DEXA scan, and PNA vaccine    HPI:  Pt is a 87 y.o. female seen today for medical management of chronic diseases.     Hx of TIA. Allergic to statin. On ASA.  HPOA desired not hospital evaluation              T5, T8, L3 compression fxs. Takes, Tylenol, Tramadol for pain.   DDD per CT head/cervical spine 08/02/20              Hx of CVA per CT head 08/02/20 Remote infarcts within the a occipital cortices bilaterally again noted. Interval development of remote infarcts within the left parietal lobe and periventricular white matte             Vascular dementia, MMSE dropped from 01/02/19 29/30 to11/3/21 MMSE 18/30, failed clock drawing. 07/15/19 CT head Mild generalized parenchymal atrophy and  chronic small vessel ischemic disease, tolerated Memantine well.                          HTN, blood pressure is controlled on Benazepril 4m qd. Bun/creat 16/0.9 09/25/20             CKD Bun/creat 16/0.9 eGFR 60 09/25/20             Gout stable, on Allopurinol 1048mqd.                        Hypothyroidism, stable, on Levothyroxine. TSH 2.34 06/22/20  Past Medical History:  Diagnosis Date   Cognitive changes    [place]   Depression    Gout    Hypertension    Hypothyroidism    Peripheral neuropathy    History reviewed. No pertinent surgical history.  Allergies  Allergen Reactions   Actonel [Risedronate Sodium]    Hct [Hydrochlorothiazide]    Lipitor [Atorvastatin Calcium]    Neomycin Other (See Comments)    Unknown "been so long ago, a Dermatologist told me"   Polysporin [Bacitracin-Polymyxin B]    Prolia [Denosumab]    Sulfa Antibiotics    Zocor [Simvastatin]     Allergies as of 10/10/2020       Reactions   Actonel [risedronate Sodium]  Hct [hydrochlorothiazide]    Lipitor [atorvastatin Calcium]    Neomycin Other (See Comments)   Unknown "been so long ago, a Dermatologist told me"   Polysporin [bacitracin-polymyxin B]    Prolia [denosumab]    Sulfa Antibiotics    Zocor [simvastatin]         Medication List        Accurate as of October 10, 2020 11:59 PM. If you have any questions, ask your nurse or doctor.          STOP taking these medications    Alfalfa 500 MG Tabs Stopped by: Hien Perreira X Morine Kohlman, NP   ascorbic acid 1000 MG tablet Commonly known as: VITAMIN C Stopped by: Dhilan Brauer X Aideen Fenster, NP   colchicine 0.6 MG tablet Stopped by: Naika Noto X Carrina Schoenberger, NP   pregabalin 50 MG capsule Commonly known as: LYRICA Stopped by: Cythina Mickelsen X Luetta Piazza, NP       TAKE these medications    acetaminophen 325 MG tablet Commonly known as: TYLENOL Take 650 mg by mouth in the morning, at noon, and at bedtime.   allopurinol 100 MG tablet Commonly known as: ZYLOPRIM Take 100 mg by  mouth daily.   aspirin 81 MG chewable tablet Chew 81 mg by mouth daily.   benazepril 10 MG tablet Commonly known as: LOTENSIN Take 10 mg by mouth daily.   Calcium Carb-Cholecalciferol 500-400 MG-UNIT Tabs Take 2 tablets by mouth daily.   cholecalciferol 25 MCG (1000 UNIT) tablet Commonly known as: VITAMIN D3 Take 1,000 Units by mouth 2 (two) times daily.   CoQ10 100 MG Caps Take 2 capsules by mouth daily.   levothyroxine 100 MCG tablet Commonly known as: SYNTHROID Take 100 mcg by mouth once a week. On Wednesday   levothyroxine 50 MCG tablet Commonly known as: SYNTHROID Take 50 mcg by mouth daily before breakfast. Sun,Mon,Tue, Thur,Fri,Sat   memantine 10 MG tablet Commonly known as: NAMENDA Take 10 mg by mouth 2 (two) times daily.   Omega-3 1000 MG Caps Take 2 capsules by mouth 2 (two) times daily.   traMADol 50 MG tablet Commonly known as: ULTRAM Take 1 tablet (50 mg total) by mouth 3 (three) times daily with meals.        Review of Systems  Constitutional:  Negative for fatigue, fever and unexpected weight change.  HENT:  Positive for hearing loss. Negative for congestion and voice change.   Eyes:  Negative for visual disturbance.  Respiratory:  Negative for shortness of breath.   Cardiovascular:  Negative for leg swelling.  Gastrointestinal:  Negative for abdominal pain and constipation.  Genitourinary:  Negative for dysuria and urgency.  Musculoskeletal:  Positive for arthralgias, back pain and gait problem.       Positional lower  portion of the mid back pain. No c/o neck, right hip, or lower back pain.   Skin:  Negative for color change.  Neurological:  Negative for speech difficulty, weakness and light-headedness.       Memory lapses. Dizziness when getting out of bed, but not with head turning.   Psychiatric/Behavioral:  Positive for confusion. Negative for behavioral problems and sleep disturbance. The patient is not nervous/anxious.    Immunization  History  Administered Date(s) Administered   Influenza, High Dose Seasonal PF 12/11/2018   Influenza-Unspecified 03/24/2018, 12/21/2019   Moderna Sars-Covid-2 Vaccination 03/15/2019, 04/21/2019, 01/17/2020, 08/07/2020   Tdap 07/15/2019   Pertinent  Health Maintenance Due  Topic Date Due   DEXA SCAN  Never done   PNA  vac Low Risk Adult (1 of 2 - PCV13) Never done   INFLUENZA VACCINE  10/08/2020   No flowsheet data found. Functional Status Survey:    Vitals:   10/10/20 0903  BP: (!) 150/71  Pulse: 61  Resp: 18  Temp: (!) 96.2 F (35.7 C)  SpO2: 96%  Weight: 142 lb 1.6 oz (64.5 kg)  Height: '5\' 1"'  (1.549 m)   Body mass index is 26.85 kg/m. Physical Exam Vitals and nursing note reviewed.  Constitutional:      Appearance: Normal appearance.  HENT:     Head: Normocephalic and atraumatic.     Mouth/Throat:     Mouth: Mucous membranes are moist.  Eyes:     Extraocular Movements: Extraocular movements intact.     Conjunctiva/sclera: Conjunctivae normal.     Pupils: Pupils are equal, round, and reactive to light.  Cardiovascular:     Rate and Rhythm: Normal rate and regular rhythm.     Heart sounds: No murmur heard. Pulmonary:     Effort: Pulmonary effort is normal.     Breath sounds: No rales.  Abdominal:     General: Bowel sounds are normal.     Palpations: Abdomen is soft.     Tenderness: There is no abdominal tenderness.  Musculoskeletal:        General: Tenderness present.     Cervical back: Normal range of motion and neck supple.     Right lower leg: No edema.     Left lower leg: No edema.     Comments: C/o lower portion of the mid back pain with position changing.   Skin:    General: Skin is warm and dry.  Neurological:     General: No focal deficit present.     Mental Status: She is alert. Mental status is at baseline.     Gait: Gait abnormal.  Psychiatric:        Mood and Affect: Mood normal.        Behavior: Behavior normal.    Labs  reviewed: Recent Labs    01/11/20 0000 06/22/20 0011 09/25/20 0000  NA 140 142 141  K 4.1 3.8 3.6  CL 106 106 106  CO2 25* 25* 26*  BUN 23* 25* 16  CREATININE 1.2* 1.2* 0.9  CALCIUM 9.4 9.4 9.4   Recent Labs    01/11/20 0000 06/22/20 0011 09/25/20 0000  AST '17 17 15  ' ALT '10 12 11  ' ALKPHOS 78 81 101  ALBUMIN 3.7 4.1 3.8   Recent Labs    12/28/19 0000 01/11/20 0000 06/22/20 0011 09/25/20 0000  WBC 8.4 7.8 8.7 8.6  NEUTROABS 6,065.00  --   --  6,364.00  HGB 13.4 13.2 13.9 13.1  HCT 40 39 42 40  PLT 220 214 209 264   Lab Results  Component Value Date   TSH 2.34 06/22/2020   No results found for: HGBA1C No results found for: CHOL, HDL, LDLCALC, LDLDIRECT, TRIG, CHOLHDL  Significant Diagnostic Results in last 30 days:  No results found.  Assessment/Plan HTN (hypertension) blood pressure is controlled on Benazepril 59m qd. Bun/creat 16/0.9 09/25/20  CKD (chronic kidney disease) stage 3, GFR 30-59 ml/min (HCC) Bun/creat 16/0.9 09/25/20  Gout No flare ups,  on Allopurinol 1060mqd.      Hypothyroidism stable, on Levothyroxine. TSH 2.34 06/22/20  [place], MMSE dropped from 01/02/19 29/30 to11/3/21 MMSE 18/30, failed clock drawing. 07/15/19 CT head Mild generalized parenchymal atrophy and chronic small vessel  ischemic disease, tolerated Memantine well.     History of CVA (cerebrovascular accident) Hx of CVA per CT head 08/02/20 Remote infarcts within the a occipital cortices bilaterally again noted. Interval development of remote infarcts within the left parietal lobe and periventricular white matte  TIA (transient ischemic attack) Hx of TIA. Allergic to statin. On ASA.  HPOA desired not hospital evaluation  DDD (degenerative disc disease), cervical T5, T8, L3 compression fxs. Takes, Tylenol, Tramadol for pain. DDD per CT head/cervical spine 08/02/20     Family/ staff Communication: plan of care reviewed with the patient and  charge nurse.   Labs/tests ordered:  none  Time spend 35 minutes.

## 2020-10-10 NOTE — Assessment & Plan Note (Signed)
Hx of CVA per CT head 08/02/20 Remote infarcts within the a occipital cortices bilaterally again noted. Interval development of remote infarcts within the left parietal lobe and periventricular white matte

## 2020-10-10 NOTE — Assessment & Plan Note (Signed)
No flare ups, on Allopurinol 100mg qd.      

## 2020-10-10 NOTE — Assessment & Plan Note (Signed)
Bun/creat 16/0.9 09/25/20

## 2020-10-10 NOTE — Assessment & Plan Note (Signed)
blood pressure is controlled on Benazepril '10mg'$  qd. Bun/creat 16/0.9 09/25/20

## 2020-10-10 NOTE — Assessment & Plan Note (Signed)
Hx of TIA. Allergic to statin. On ASA.  HPOA desired not hospital evaluation

## 2020-10-10 NOTE — Assessment & Plan Note (Signed)
stable, on Levothyroxine. TSH 2.34 06/22/20

## 2020-10-10 NOTE — Assessment & Plan Note (Signed)
Vascular dementia, MMSE dropped from 01/02/19 29/30 to11/3/21 MMSE 18/30, failed clock drawing. 07/15/19 CT head Mild generalized parenchymal atrophy and chronic small vessel ischemic disease, tolerated Memantine well.

## 2020-10-11 DIAGNOSIS — I129 Hypertensive chronic kidney disease with stage 1 through stage 4 chronic kidney disease, or unspecified chronic kidney disease: Secondary | ICD-10-CM | POA: Diagnosis not present

## 2020-10-11 DIAGNOSIS — M5459 Other low back pain: Secondary | ICD-10-CM | POA: Diagnosis not present

## 2020-10-11 DIAGNOSIS — R1312 Dysphagia, oropharyngeal phase: Secondary | ICD-10-CM | POA: Diagnosis not present

## 2020-10-11 DIAGNOSIS — F0390 Unspecified dementia without behavioral disturbance: Secondary | ICD-10-CM | POA: Diagnosis not present

## 2020-10-11 DIAGNOSIS — R41841 Cognitive communication deficit: Secondary | ICD-10-CM | POA: Diagnosis not present

## 2020-10-11 DIAGNOSIS — R1310 Dysphagia, unspecified: Secondary | ICD-10-CM | POA: Diagnosis not present

## 2020-10-12 DIAGNOSIS — R41841 Cognitive communication deficit: Secondary | ICD-10-CM | POA: Diagnosis not present

## 2020-10-12 DIAGNOSIS — F0390 Unspecified dementia without behavioral disturbance: Secondary | ICD-10-CM | POA: Diagnosis not present

## 2020-10-12 DIAGNOSIS — R1312 Dysphagia, oropharyngeal phase: Secondary | ICD-10-CM | POA: Diagnosis not present

## 2020-10-12 DIAGNOSIS — R1310 Dysphagia, unspecified: Secondary | ICD-10-CM | POA: Diagnosis not present

## 2020-10-12 DIAGNOSIS — M5459 Other low back pain: Secondary | ICD-10-CM | POA: Diagnosis not present

## 2020-10-12 DIAGNOSIS — I129 Hypertensive chronic kidney disease with stage 1 through stage 4 chronic kidney disease, or unspecified chronic kidney disease: Secondary | ICD-10-CM | POA: Diagnosis not present

## 2020-10-14 DIAGNOSIS — M5459 Other low back pain: Secondary | ICD-10-CM | POA: Diagnosis not present

## 2020-10-14 DIAGNOSIS — R41841 Cognitive communication deficit: Secondary | ICD-10-CM | POA: Diagnosis not present

## 2020-10-14 DIAGNOSIS — I129 Hypertensive chronic kidney disease with stage 1 through stage 4 chronic kidney disease, or unspecified chronic kidney disease: Secondary | ICD-10-CM | POA: Diagnosis not present

## 2020-10-14 DIAGNOSIS — R1312 Dysphagia, oropharyngeal phase: Secondary | ICD-10-CM | POA: Diagnosis not present

## 2020-10-14 DIAGNOSIS — R1310 Dysphagia, unspecified: Secondary | ICD-10-CM | POA: Diagnosis not present

## 2020-10-14 DIAGNOSIS — F0390 Unspecified dementia without behavioral disturbance: Secondary | ICD-10-CM | POA: Diagnosis not present

## 2020-10-15 ENCOUNTER — Encounter: Payer: Self-pay | Admitting: Nurse Practitioner

## 2020-10-16 DIAGNOSIS — R1310 Dysphagia, unspecified: Secondary | ICD-10-CM | POA: Diagnosis not present

## 2020-10-16 DIAGNOSIS — I129 Hypertensive chronic kidney disease with stage 1 through stage 4 chronic kidney disease, or unspecified chronic kidney disease: Secondary | ICD-10-CM | POA: Diagnosis not present

## 2020-10-16 DIAGNOSIS — F0390 Unspecified dementia without behavioral disturbance: Secondary | ICD-10-CM | POA: Diagnosis not present

## 2020-10-16 DIAGNOSIS — M5459 Other low back pain: Secondary | ICD-10-CM | POA: Diagnosis not present

## 2020-10-16 DIAGNOSIS — R1312 Dysphagia, oropharyngeal phase: Secondary | ICD-10-CM | POA: Diagnosis not present

## 2020-10-16 DIAGNOSIS — R41841 Cognitive communication deficit: Secondary | ICD-10-CM | POA: Diagnosis not present

## 2020-10-17 DIAGNOSIS — I129 Hypertensive chronic kidney disease with stage 1 through stage 4 chronic kidney disease, or unspecified chronic kidney disease: Secondary | ICD-10-CM | POA: Diagnosis not present

## 2020-10-17 DIAGNOSIS — M5459 Other low back pain: Secondary | ICD-10-CM | POA: Diagnosis not present

## 2020-10-17 DIAGNOSIS — R1310 Dysphagia, unspecified: Secondary | ICD-10-CM | POA: Diagnosis not present

## 2020-10-17 DIAGNOSIS — R41841 Cognitive communication deficit: Secondary | ICD-10-CM | POA: Diagnosis not present

## 2020-10-17 DIAGNOSIS — F0390 Unspecified dementia without behavioral disturbance: Secondary | ICD-10-CM | POA: Diagnosis not present

## 2020-10-17 DIAGNOSIS — R1312 Dysphagia, oropharyngeal phase: Secondary | ICD-10-CM | POA: Diagnosis not present

## 2020-10-18 DIAGNOSIS — F0390 Unspecified dementia without behavioral disturbance: Secondary | ICD-10-CM | POA: Diagnosis not present

## 2020-10-18 DIAGNOSIS — R1312 Dysphagia, oropharyngeal phase: Secondary | ICD-10-CM | POA: Diagnosis not present

## 2020-10-18 DIAGNOSIS — I129 Hypertensive chronic kidney disease with stage 1 through stage 4 chronic kidney disease, or unspecified chronic kidney disease: Secondary | ICD-10-CM | POA: Diagnosis not present

## 2020-10-18 DIAGNOSIS — R1310 Dysphagia, unspecified: Secondary | ICD-10-CM | POA: Diagnosis not present

## 2020-10-18 DIAGNOSIS — M5459 Other low back pain: Secondary | ICD-10-CM | POA: Diagnosis not present

## 2020-10-18 DIAGNOSIS — R41841 Cognitive communication deficit: Secondary | ICD-10-CM | POA: Diagnosis not present

## 2020-10-19 DIAGNOSIS — R41841 Cognitive communication deficit: Secondary | ICD-10-CM | POA: Diagnosis not present

## 2020-10-19 DIAGNOSIS — R1312 Dysphagia, oropharyngeal phase: Secondary | ICD-10-CM | POA: Diagnosis not present

## 2020-10-19 DIAGNOSIS — R1310 Dysphagia, unspecified: Secondary | ICD-10-CM | POA: Diagnosis not present

## 2020-10-19 DIAGNOSIS — F0390 Unspecified dementia without behavioral disturbance: Secondary | ICD-10-CM | POA: Diagnosis not present

## 2020-10-19 DIAGNOSIS — I129 Hypertensive chronic kidney disease with stage 1 through stage 4 chronic kidney disease, or unspecified chronic kidney disease: Secondary | ICD-10-CM | POA: Diagnosis not present

## 2020-10-19 DIAGNOSIS — M5459 Other low back pain: Secondary | ICD-10-CM | POA: Diagnosis not present

## 2020-10-21 DIAGNOSIS — R1312 Dysphagia, oropharyngeal phase: Secondary | ICD-10-CM | POA: Diagnosis not present

## 2020-10-21 DIAGNOSIS — R1310 Dysphagia, unspecified: Secondary | ICD-10-CM | POA: Diagnosis not present

## 2020-10-21 DIAGNOSIS — I129 Hypertensive chronic kidney disease with stage 1 through stage 4 chronic kidney disease, or unspecified chronic kidney disease: Secondary | ICD-10-CM | POA: Diagnosis not present

## 2020-10-21 DIAGNOSIS — R41841 Cognitive communication deficit: Secondary | ICD-10-CM | POA: Diagnosis not present

## 2020-10-21 DIAGNOSIS — M5459 Other low back pain: Secondary | ICD-10-CM | POA: Diagnosis not present

## 2020-10-21 DIAGNOSIS — F0390 Unspecified dementia without behavioral disturbance: Secondary | ICD-10-CM | POA: Diagnosis not present

## 2020-10-22 ENCOUNTER — Encounter: Payer: Self-pay | Admitting: Orthopedic Surgery

## 2020-10-22 ENCOUNTER — Non-Acute Institutional Stay (SKILLED_NURSING_FACILITY): Payer: Medicare Other | Admitting: Orthopedic Surgery

## 2020-10-22 DIAGNOSIS — R112 Nausea with vomiting, unspecified: Secondary | ICD-10-CM | POA: Diagnosis not present

## 2020-10-22 DIAGNOSIS — R1310 Dysphagia, unspecified: Secondary | ICD-10-CM | POA: Diagnosis not present

## 2020-10-22 DIAGNOSIS — R42 Dizziness and giddiness: Secondary | ICD-10-CM | POA: Diagnosis not present

## 2020-10-22 DIAGNOSIS — N1831 Chronic kidney disease, stage 3a: Secondary | ICD-10-CM | POA: Diagnosis not present

## 2020-10-22 DIAGNOSIS — F0150 Vascular dementia without behavioral disturbance: Secondary | ICD-10-CM

## 2020-10-22 DIAGNOSIS — E039 Hypothyroidism, unspecified: Secondary | ICD-10-CM

## 2020-10-22 DIAGNOSIS — M109 Gout, unspecified: Secondary | ICD-10-CM | POA: Diagnosis not present

## 2020-10-22 DIAGNOSIS — I129 Hypertensive chronic kidney disease with stage 1 through stage 4 chronic kidney disease, or unspecified chronic kidney disease: Secondary | ICD-10-CM | POA: Diagnosis not present

## 2020-10-22 DIAGNOSIS — F0390 Unspecified dementia without behavioral disturbance: Secondary | ICD-10-CM | POA: Diagnosis not present

## 2020-10-22 DIAGNOSIS — I10 Essential (primary) hypertension: Secondary | ICD-10-CM

## 2020-10-22 DIAGNOSIS — R41841 Cognitive communication deficit: Secondary | ICD-10-CM | POA: Diagnosis not present

## 2020-10-22 DIAGNOSIS — G459 Transient cerebral ischemic attack, unspecified: Secondary | ICD-10-CM

## 2020-10-22 DIAGNOSIS — M5459 Other low back pain: Secondary | ICD-10-CM | POA: Diagnosis not present

## 2020-10-22 DIAGNOSIS — R1312 Dysphagia, oropharyngeal phase: Secondary | ICD-10-CM | POA: Diagnosis not present

## 2020-10-22 LAB — BASIC METABOLIC PANEL
BUN: 14 (ref 4–21)
CO2: 25 — AB (ref 13–22)
Chloride: 103 (ref 99–108)
Creatinine: 0.9 (ref 0.5–1.1)
Glucose: 120
Potassium: 3.9 (ref 3.4–5.3)
Sodium: 139 (ref 137–147)

## 2020-10-22 LAB — HEPATIC FUNCTION PANEL
ALT: 11 (ref 7–35)
AST: 18 (ref 13–35)
Alkaline Phosphatase: 105 (ref 25–125)
Bilirubin, Total: 0.6

## 2020-10-22 LAB — COMPREHENSIVE METABOLIC PANEL
Albumin: 4.1 (ref 3.5–5.0)
Calcium: 9.4 (ref 8.7–10.7)
Globulin: 2.8

## 2020-10-22 LAB — CBC AND DIFFERENTIAL
HCT: 46 (ref 36–46)
Hemoglobin: 15.2 (ref 12.0–16.0)
Neutrophils Absolute: 7230
Platelets: 274 (ref 150–399)
WBC: 8.7

## 2020-10-22 LAB — CBC: RBC: 4.64 (ref 3.87–5.11)

## 2020-10-22 NOTE — Progress Notes (Signed)
Location:  [facility] Room Number: N-40 Place of Service:  SNF [phone]) Provider:  Windell Moulding, AGNP-C  Virgie Dad, MD  Patient Care Team: Virgie Dad, MD as PCP - General (Internal Medicine) Mast, Man X, NP as Nurse Practitioner (Internal Medicine) Virgie Dad, MD as Consulting Physician (Internal Medicine)  Extended Emergency Contact Information Primary Emergency Contact: Hovater,[name]  Johnnette Litter of [place] Phone: [phone] Relation: Son  Code Status:  DNR Goals of care: Advanced Directive information Advanced Directives 10/10/2020  Does Patient Have a Medical Advance Directive? Yes  Type of Advance Directive [place]  Does patient want to make changes to medical advance directive? No - Patient declined  Copy of [place] in Chart? Yes - validated most recent copy scanned in chart (See row information)  Would patient like information on creating a medical advance directive? -  Pre-existing out of facility DNR order (yellow form or pink MOST form) -     Chief Complaint  Patient presents with   Acute Visit    Nausea, vomiting, dizziness   Medical Management of Chronic Issues    HPI:  Pt is a 87 y.o. female seen today for acute visit due to nausea, vomiting and dizziness.   She currently resides on the skilled unit at [facility]. Past medical history includes: HTN, TIA, hypothyroidism, dementia, DDD, CKD stage III, dizziness, and gait instability.   Nursing reports nausea with vomiting after lunch. Emesis green. She did not eat breakfast due to nausea this morning. She is a poor historian due to dementia. She states that she feels like the room is spinning during our encounter. She refused to have orthostatics done due to dizziness. Vitals stable, except blood pressure elevated 168/72. Hgb 13.1 09/25/2020.   Dementia- MMSE 29/30 01/02/2019, CT head with mild generalized parenchymal  atrophy and chronic small vessel ischemic disease, namenda 10 mg bid HTN- , benazepril 10 mg daily, received medication this morning CVA- CT head 08/02/2020 revealed infarcts within occipital cortices bilaterally and interval development of remote infarcts within left parietal lobe and periventricular white matter.  TIA- allergic to statin, remains on ASA CKD stage III- Bun/creat 16/0.9 09/25/20 Gout- remains on allopurinol 100 mg daily Hypothyroidism- TSH 2.34 06/22/2020, levothyroxine 100 mcg once a week, 50 mcg daily  No recent falls, completing ADLs on own, PT notes improvement in gait and balance within past 2 weeks.   Recent blood pressures:  08/02- 150/71, 132/67  07/26- 118/66  07/19- 136/56, 124/76  Nurse does not report any other concerns, vitals stable.       Past Medical History:  Diagnosis Date   Cognitive changes    Dementia ([place])    Depression    Gout    Hypertension    Hypothyroidism    Peripheral neuropathy    No past surgical history on file.  Allergies  Allergen Reactions   Actonel [Risedronate Sodium]    Hct [Hydrochlorothiazide]    Lipitor [Atorvastatin Calcium]    Neomycin Other (See Comments)    Unknown "been so long ago, a Dermatologist told me"   Polysporin [Bacitracin-Polymyxin B]    Prolia [Denosumab]    Sulfa Antibiotics    Zocor [Simvastatin]     Outpatient Encounter Medications as of 10/22/2020  Medication Sig   acetaminophen (TYLENOL) 325 MG tablet Take 650 mg by mouth in the morning, at noon, and at bedtime.   allopurinol (ZYLOPRIM) 100 MG tablet Take 100  mg by mouth daily.   aspirin 81 MG chewable tablet Chew 81 mg by mouth daily.   benazepril (LOTENSIN) 10 MG tablet Take 10 mg by mouth daily.   Calcium Carb-Cholecalciferol 500-400 MG-UNIT TABS Take 2 tablets by mouth daily.    cholecalciferol (VITAMIN D3) 25 MCG (1000 UNIT) tablet Take 1,000 Units by mouth 2 (two) times daily.   Coenzyme Q10 (COQ10) 100 MG CAPS Take 2 capsules by  mouth daily.   levothyroxine (SYNTHROID) 100 MCG tablet Take 100 mcg by mouth once a week. On Wednesday   levothyroxine (SYNTHROID) 50 MCG tablet Take 50 mcg by mouth daily before breakfast. Sun,Mon,Tue, Thur,Fri,Sat   memantine (NAMENDA) 10 MG tablet Take 10 mg by mouth 2 (two) times daily.   Omega-3 1000 MG CAPS Take 2 capsules by mouth 2 (two) times daily.    traMADol (ULTRAM) 50 MG tablet Take 1 tablet (50 mg total) by mouth 3 (three) times daily with meals.   No facility-administered encounter medications on file as of 10/22/2020.    Review of Systems  Unable to perform ROS: Dementia   Immunization History  Administered Date(s) Administered   Influenza, High Dose Seasonal PF 12/11/2018   Influenza-Unspecified 03/24/2018, 12/21/2019   Moderna Sars-Covid-2 Vaccination 03/15/2019, 04/21/2019, 01/17/2020, 08/07/2020   Tdap 07/15/2019   Pertinent  Health Maintenance Due  Topic Date Due   DEXA SCAN  Never done   PNA vac Low Risk Adult (1 of 2 - PCV13) Never done   INFLUENZA VACCINE  10/08/2020   No flowsheet data found. Functional Status Survey:    Vitals:   10/22/20 1534  BP: (!) 168/72  Pulse: 74  Resp: 18  Temp: (!) 97.2 F (36.2 C)  SpO2: 95%  Weight: 142 lb 3.2 oz (64.5 kg)  Height: '5\' 4"'$  (1.626 m)   Body mass index is 24.41 kg/m. Physical Exam Vitals reviewed.  Constitutional:      General: She is not in acute distress. HENT:     Head: Normocephalic.     Mouth/Throat:     Mouth: Mucous membranes are moist.  Eyes:     General:        Right eye: No discharge.        Left eye: No discharge.  Cardiovascular:     Rate and Rhythm: Normal rate and regular rhythm.     Pulses: Normal pulses.     Heart sounds: Normal heart sounds. No murmur heard. Pulmonary:     Effort: Pulmonary effort is normal. No respiratory distress.     Breath sounds: Normal breath sounds. No wheezing.  Abdominal:     General: Bowel sounds are normal. There is no distension.      Palpations: Abdomen is soft.     Tenderness: There is no abdominal tenderness.     Comments: Active bowel sounds x 4  Musculoskeletal:     Cervical back: Normal range of motion.     Right lower leg: No edema.     Left lower leg: No edema.  Lymphadenopathy:     Cervical: No cervical adenopathy.  Skin:    General: Skin is warm and dry.     Capillary Refill: Capillary refill takes less than 2 seconds.  Neurological:     General: No focal deficit present.     Mental Status: She is alert. Mental status is at baseline.     Motor: Weakness present.     Gait: Gait abnormal.     Comments: Walker, wheelchair  Psychiatric:  Mood and Affect: Mood normal.        Speech: Speech normal.        Cognition and Memory: Memory is impaired.     Comments: Appears tired, gave one-worded answers during encounter    Labs reviewed: Recent Labs    01/11/20 0000 06/22/20 0011 09/25/20 0000  NA 140 142 141  K 4.1 3.8 3.6  CL 106 106 106  CO2 25* 25* 26*  BUN 23* 25* 16  CREATININE 1.2* 1.2* 0.9  CALCIUM 9.4 9.4 9.4   Recent Labs    01/11/20 0000 06/22/20 0011 09/25/20 0000  AST '17 17 15  '$ ALT '10 12 11  '$ ALKPHOS 78 81 101  ALBUMIN 3.7 4.1 3.8   Recent Labs    12/28/19 0000 01/11/20 0000 06/22/20 0011 09/25/20 0000  WBC 8.4 7.8 8.7 8.6  NEUTROABS 6,065.00  --   --  6,364.00  HGB 13.4 13.2 13.9 13.1  HCT 40 39 42 40  PLT 220 214 209 264   Lab Results  Component Value Date   TSH 2.34 06/22/2020   No results found for: HGBA1C No results found for: CHOL, HDL, LDLCALC, LDLDIRECT, TRIG, CHOLHDL  Significant Diagnostic Results in last 30 days:  No results found.  Assessment/Plan 1. Nausea and vomiting, intractability of vomiting not specified, unspecified vomiting type - one vomiting episode, emesis green - abdomen soft, non-distended, normal bowel sounds - zofran 4 mg po Q 8hrs prn x 1 month - recommend bland foods the next few days - cmp  2. Dizziness - reports " room  spinning" - history of dizziness - meclizine 25 mg po bid prn x 1 month - cbc/diff  3. Primary hypertension - suspect elevated bp today due to dizziness - cont benazepril 10 mg daily - blood pressures bid x 7 days- report readings to provider after one week  4. Stage 3a chronic kidney disease ([place]) -Bun/creat 16/0.9 09/25/20 - continue to avoid nephrotoxic drugs and dose adjust medications to be renally excreted - encourage hydration  5. Vascular dementia without behavioral disturbance (HCC) - no recent behavioral outbursts - cont namenda 10 mg bid   6. TIA (transient ischemic attack) - allergic to statins - cont ASA  7. Gout, unspecified cause, unspecified chronicity, unspecified site - stable with allopurinol 100 mg daily  8. Hypothyroidism, unspecified type - TSH 2.34 06/22/2020 - cont levothyroxine 100 mcg once a week, 50 mcg daily    Family/ staff Communication: plan discussed with patient and nurse  Labs/tests ordered:  cbc/diff, cmp

## 2020-10-23 DIAGNOSIS — F0390 Unspecified dementia without behavioral disturbance: Secondary | ICD-10-CM | POA: Diagnosis not present

## 2020-10-23 DIAGNOSIS — I129 Hypertensive chronic kidney disease with stage 1 through stage 4 chronic kidney disease, or unspecified chronic kidney disease: Secondary | ICD-10-CM | POA: Diagnosis not present

## 2020-10-23 DIAGNOSIS — R1312 Dysphagia, oropharyngeal phase: Secondary | ICD-10-CM | POA: Diagnosis not present

## 2020-10-23 DIAGNOSIS — R41841 Cognitive communication deficit: Secondary | ICD-10-CM | POA: Diagnosis not present

## 2020-10-23 DIAGNOSIS — R1310 Dysphagia, unspecified: Secondary | ICD-10-CM | POA: Diagnosis not present

## 2020-10-23 DIAGNOSIS — M5459 Other low back pain: Secondary | ICD-10-CM | POA: Diagnosis not present

## 2020-10-24 ENCOUNTER — Non-Acute Institutional Stay (SKILLED_NURSING_FACILITY): Payer: Medicare Other | Admitting: Nurse Practitioner

## 2020-10-24 ENCOUNTER — Encounter: Payer: Self-pay | Admitting: Nurse Practitioner

## 2020-10-24 DIAGNOSIS — G459 Transient cerebral ischemic attack, unspecified: Secondary | ICD-10-CM | POA: Diagnosis not present

## 2020-10-24 DIAGNOSIS — R2681 Unsteadiness on feet: Secondary | ICD-10-CM | POA: Diagnosis not present

## 2020-10-24 DIAGNOSIS — R102 Pelvic and perineal pain: Secondary | ICD-10-CM | POA: Diagnosis not present

## 2020-10-24 DIAGNOSIS — M159 Polyosteoarthritis, unspecified: Secondary | ICD-10-CM | POA: Insufficient documentation

## 2020-10-24 DIAGNOSIS — F0390 Unspecified dementia without behavioral disturbance: Secondary | ICD-10-CM | POA: Diagnosis not present

## 2020-10-24 DIAGNOSIS — I10 Essential (primary) hypertension: Secondary | ICD-10-CM

## 2020-10-24 DIAGNOSIS — R41841 Cognitive communication deficit: Secondary | ICD-10-CM | POA: Diagnosis not present

## 2020-10-24 DIAGNOSIS — R42 Dizziness and giddiness: Secondary | ICD-10-CM

## 2020-10-24 DIAGNOSIS — S32030A Wedge compression fracture of third lumbar vertebra, initial encounter for closed fracture: Secondary | ICD-10-CM | POA: Diagnosis not present

## 2020-10-24 DIAGNOSIS — M5459 Other low back pain: Secondary | ICD-10-CM | POA: Diagnosis not present

## 2020-10-24 DIAGNOSIS — I129 Hypertensive chronic kidney disease with stage 1 through stage 4 chronic kidney disease, or unspecified chronic kidney disease: Secondary | ICD-10-CM | POA: Diagnosis not present

## 2020-10-24 DIAGNOSIS — M25551 Pain in right hip: Secondary | ICD-10-CM | POA: Diagnosis not present

## 2020-10-24 DIAGNOSIS — M109 Gout, unspecified: Secondary | ICD-10-CM | POA: Diagnosis not present

## 2020-10-24 DIAGNOSIS — F0150 Vascular dementia without behavioral disturbance: Secondary | ICD-10-CM | POA: Diagnosis not present

## 2020-10-24 DIAGNOSIS — Z8673 Personal history of transient ischemic attack (TIA), and cerebral infarction without residual deficits: Secondary | ICD-10-CM

## 2020-10-24 DIAGNOSIS — N1831 Chronic kidney disease, stage 3a: Secondary | ICD-10-CM

## 2020-10-24 DIAGNOSIS — R1312 Dysphagia, oropharyngeal phase: Secondary | ICD-10-CM | POA: Diagnosis not present

## 2020-10-24 DIAGNOSIS — E039 Hypothyroidism, unspecified: Secondary | ICD-10-CM

## 2020-10-24 DIAGNOSIS — W19XXXA Unspecified fall, initial encounter: Secondary | ICD-10-CM | POA: Diagnosis not present

## 2020-10-24 DIAGNOSIS — S32020A Wedge compression fracture of second lumbar vertebra, initial encounter for closed fracture: Secondary | ICD-10-CM | POA: Diagnosis not present

## 2020-10-24 DIAGNOSIS — R1310 Dysphagia, unspecified: Secondary | ICD-10-CM | POA: Diagnosis not present

## 2020-10-24 NOTE — Assessment & Plan Note (Addendum)
blood pressure is controlled on Benazepril '10mg'$  qd. Bun/creat 14/0.9 10/22/20

## 2020-10-24 NOTE — Assessment & Plan Note (Addendum)
Bun/creat 14/0.9 10/22/20

## 2020-10-24 NOTE — Assessment & Plan Note (Signed)
Hx of CVA per CT head 08/02/20 Remote infarcts within the a occipital cortices bilaterally again noted. Interval development of remote infarcts within the left parietal lobe and periventricular white matte

## 2020-10-24 NOTE — Progress Notes (Addendum)
Location:   SNF [facility] Room Number: N040 Place of Service:  SNF (31) Provider: Lennie Odor Ethen Bannan NP  Virgie Dad, MD  Patient Care Team: Virgie Dad, MD as PCP - General (Internal Medicine) Vada Yellen X, NP as Nurse Practitioner (Internal Medicine) Virgie Dad, MD as Consulting Physician (Internal Medicine)  Extended Emergency Contact Information Primary Emergency Contact: Clairmont,[name]  Johnnette Litter of [place] Phone: [phone] Relation: Son  Code Status: DNR Goals of care: Advanced Directive information Advanced Directives 10/24/2020  Does Patient Have a Medical Advance Directive? Yes  Type of Advance Directive [place]  Does patient want to make changes to medical advance directive? No - Patient declined  Copy of [place] in Chart? Yes - validated most recent copy scanned in chart (See row information)  Would patient like information on creating a medical advance directive? -  Pre-existing out of facility DNR order (yellow form or pink MOST form) -     Chief Complaint  Patient presents with   Acute Visit    Patient presents after a fall for hip pain.      HPI:  Pt is a 87 y.o. female seen today for an acute visit for unwitnessed fall when the patient was found sitting on the floor. The patient stated she lost balance and fell when she lifted her hand the bed rail. C/o R hip pain, but she transferred self from bed to recliner, no noted deformity of the left hip or knee. Pending CBC, BMP  Gait abnormality, uses walker to ambulate.   Nausea, vomiting, dizziness 10/22/20, not complained today.  Hx of TIA. Allergic to statin. On ASA.  HPOA desired not hospital evaluation              T5, T8, L3 compression fxs. Takes, Tylenol, Tramadol for pain.                    DDD per CT head/cervical spine 08/02/20              Hx of CVA per CT head 08/02/20 Remote infarcts within the a occipital cortices bilaterally again noted.  Interval development of remote infarcts within the left parietal lobe and periventricular white matte             Vascular dementia, MMSE dropped from 01/02/19 29/30 to11/3/21 MMSE 18/30, failed clock drawing. 07/15/19 CT head Mild generalized parenchymal atrophy and chronic small vessel ischemic disease, tolerated Memantine well.                          HTN, blood pressure is controlled on Benazepril '10mg'$  qd. Bun/creat 14/0.9 10/22/20             CKD Bun/creat 14/0.9 10/22/20             Gout stable, on Allopurinol '100mg'$  qd.                        Hypothyroidism, stable, on Levothyroxine. TSH 2.34 06/22/20   Past Medical History:  Diagnosis Date   Cognitive changes    Dementia ([place])    Depression    Gout    Hypertension    Hypothyroidism    Peripheral neuropathy    History reviewed. No pertinent surgical history.  Allergies  Allergen Reactions   Actonel [Risedronate Sodium]    Hct [Hydrochlorothiazide]    Lipitor [Atorvastatin Calcium]  Neomycin Other (See Comments)    Unknown "been so long ago, a Dermatologist told me"   Polysporin [Bacitracin-Polymyxin B]    Prolia [Denosumab]    Sulfa Antibiotics    Zocor [Simvastatin]     Allergies as of 10/24/2020       Reactions   Actonel [risedronate Sodium]    Hct [hydrochlorothiazide]    Lipitor [atorvastatin Calcium]    Neomycin Other (See Comments)   Unknown "been so long ago, a Dermatologist told me"   Polysporin [bacitracin-polymyxin B]    Prolia [denosumab]    Sulfa Antibiotics    Zocor [simvastatin]         Medication List        Accurate as of October 24, 2020  3:38 PM. If you have any questions, ask your nurse or doctor.          acetaminophen 325 MG tablet Commonly known as: TYLENOL Take 650 mg by mouth in the morning, at noon, and at bedtime.   allopurinol 100 MG tablet Commonly known as: ZYLOPRIM Take 100 mg by mouth daily.   aspirin 81 MG chewable tablet Chew 81 mg by mouth daily.   benazepril 10  MG tablet Commonly known as: LOTENSIN Take 10 mg by mouth daily.   Calcium Carb-Cholecalciferol 500-400 MG-UNIT Tabs Take 2 tablets by mouth daily.   cholecalciferol 25 MCG (1000 UNIT) tablet Commonly known as: VITAMIN D3 Take 1,000 Units by mouth 2 (two) times daily.   CoQ10 100 MG Caps Take 2 capsules by mouth daily.   levothyroxine 100 MCG tablet Commonly known as: SYNTHROID Take 100 mcg by mouth once a week. On Wednesday   levothyroxine 50 MCG tablet Commonly known as: SYNTHROID Take 50 mcg by mouth daily before breakfast. Sun,Mon,Tue, Thur,Fri,Sat   meclizine 25 MG tablet Commonly known as: ANTIVERT Take 25 mg by mouth 2 (two) times daily as needed.   memantine 10 MG tablet Commonly known as: NAMENDA Take 10 mg by mouth 2 (two) times daily.   Omega-3 1000 MG Caps Take 2 capsules by mouth 2 (two) times daily.   ondansetron 4 MG tablet Commonly known as: ZOFRAN Take 4 mg by mouth every 8 (eight) hours as needed.   traMADol 50 MG tablet Commonly known as: ULTRAM Take 1 tablet (50 mg total) by mouth 3 (three) times daily with meals.        Review of Systems  Constitutional:  Negative for activity change, appetite change and fever.  HENT:  Positive for hearing loss. Negative for congestion and voice change.   Eyes:  Negative for visual disturbance.  Respiratory:  Negative for shortness of breath.   Cardiovascular:  Negative for leg swelling.  Gastrointestinal:  Negative for abdominal pain, constipation, diarrhea, nausea and vomiting.  Genitourinary:  Negative for dysuria and urgency.  Musculoskeletal:  Positive for arthralgias, back pain and gait problem.       Positional lower  portion of the mid back pain. No c/o neck, right hip, or lower back pain.   Skin:  Negative for color change.  Neurological:  Positive for dizziness. Negative for speech difficulty, weakness and headaches.  Psychiatric/Behavioral:  Positive for confusion. Negative for behavioral  problems and sleep disturbance. The patient is not nervous/anxious.    Immunization History  Administered Date(s) Administered   Influenza, High Dose Seasonal PF 12/11/2018   Influenza-Unspecified 03/24/2018, 12/21/2019   Moderna Sars-Covid-2 Vaccination 03/15/2019, 04/21/2019, 01/17/2020, 08/07/2020   Tdap 07/15/2019   Pertinent  Health Maintenance Due  Topic Date Due   DEXA SCAN  Never done   PNA vac Low Risk Adult (1 of 2 - PCV13) Never done   INFLUENZA VACCINE  10/08/2020   No flowsheet data found. Functional Status Survey:    Vitals:   10/24/20 1505  BP: (!) 156/80  Pulse: 100  Resp: 17  Temp: (!) 96.8 F (36 C)  SpO2: 94%  Weight: 142 lb 3.2 oz (64.5 kg)  Height: '5\' 4"'$  (1.626 m)   Body mass index is 24.41 kg/m. Physical Exam Vitals and nursing note reviewed.  Constitutional:      Appearance: Normal appearance.  HENT:     Head: Normocephalic and atraumatic.     Mouth/Throat:     Mouth: Mucous membranes are moist.  Eyes:     Extraocular Movements: Extraocular movements intact.     Conjunctiva/sclera: Conjunctivae normal.     Pupils: Pupils are equal, round, and reactive to light.  Cardiovascular:     Rate and Rhythm: Normal rate and regular rhythm.     Heart sounds: No murmur heard. Pulmonary:     Effort: Pulmonary effort is normal.     Breath sounds: No rales.  Abdominal:     General: Bowel sounds are normal.     Palpations: Abdomen is soft.     Tenderness: There is no abdominal tenderness.  Musculoskeletal:        General: Tenderness present.     Cervical back: Normal range of motion and neck supple.     Right lower leg: No edema.     Left lower leg: No edema.     Comments: C/o lower portion of the mid back pain with position changing. R hip pain with weight bearing and right leg movement.   Skin:    General: Skin is warm and dry.  Neurological:     General: No focal deficit present.     Mental Status: She is alert. Mental status is at baseline.      Gait: Gait abnormal.  Psychiatric:        Mood and Affect: Mood normal.        Behavior: Behavior normal.    Labs reviewed: Recent Labs    06/22/20 0011 09/25/20 0000 10/22/20 0000  NA 142 141 139  K 3.8 3.6 3.9  CL 106 106 103  CO2 25* 26* 25*  BUN 25* 16 14  CREATININE 1.2* 0.9 0.9  CALCIUM 9.4 9.4 9.4   Recent Labs    06/22/20 0011 09/25/20 0000 10/22/20 0000  AST '17 15 18  '$ ALT '12 11 11  '$ ALKPHOS 81 101 105  ALBUMIN 4.1 3.8 4.1   Recent Labs    12/28/19 0000 01/11/20 0000 06/22/20 0011 09/25/20 0000 10/22/20 0000  WBC 8.4   < > 8.7 8.6 8.7  NEUTROABS 6,065.00  --   --  6,364.00 7,230.00  HGB 13.4   < > 13.9 13.1 15.2  HCT 40   < > 42 40 46  PLT 220   < > 209 264 274   < > = values in this interval not displayed.   Lab Results  Component Value Date   TSH 2.34 06/22/2020   No results found for: HGBA1C No results found for: CHOL, HDL, LDLCALC, LDLDIRECT, TRIG, CHOLHDL  Significant Diagnostic Results in last 30 days:  No results found.  Assessment/Plan: Fall unwitnessed fall when the patient was found sitting on the floor. The patient stated she lost balance and fell when she lifted her  hand the bed rail. C/o R hip pain, but she transferred self from bed to recliner, no noted deformity of the left hip or knee. Pending CBC, BMP, obtain X-Medearis R hip, pelvis, lumbar spine to r/o fxs.   Generalized osteoarthritis of multiple sites C/o R hip pain, but she transferred self from bed to recliner, no noted deformity of the right hip or knee. Pending CBC, BMP, X-Eisenhour R hip, pelvis, lumbar spine.  T5, T8, L3 compression fxs. Takes, Tylenol, Tramadol for pain.        DDD per CT head/cervical spine 08/02/20   Gait instability uses walker to ambulate.  TIA (transient ischemic attack) Hx of TIA. Allergic to statin. On ASA.  HPOA desired not hospital evaluation  History of CVA (cerebrovascular accident) Hx of CVA per CT head 08/02/20 Remote infarcts within the a  occipital cortices bilaterally again noted. Interval development of remote infarcts within the left parietal lobe and periventricular white matte  Vascular dementia ([place]) MMSE dropped from 01/02/19 29/30 to11/3/21 MMSE 18/30, failed clock drawing. 07/15/19 CT head Mild generalized parenchymal atrophy and chronic small vessel ischemic disease, tolerated Memantine well.     HTN (hypertension) blood pressure is controlled on Benazepril '10mg'$  qd. Bun/creat 14/0.9 10/22/20  CKD (chronic kidney disease) stage 3, GFR 30-59 ml/min (HCC) Bun/creat 14/0.9 10/22/20  Gout on Allopurinol '100mg'$  qd.           Hypothyroidism stable, on Levothyroxine. TSH 2.34 06/22/20    Family/ staff Communication: plan of care reviewed with the patient and charge nurse.   Labs/tests ordered:  X-[place] R hip, pelvis, lumbar spine  Time spend 35 minutes.

## 2020-10-24 NOTE — Assessment & Plan Note (Signed)
uses walker to ambulate.       

## 2020-10-24 NOTE — Assessment & Plan Note (Signed)
stable, on Levothyroxine. TSH 2.34 06/22/20

## 2020-10-24 NOTE — Assessment & Plan Note (Signed)
Hx of TIA. Allergic to statin. On ASA.  HPOA desired not hospital evaluation

## 2020-10-24 NOTE — Assessment & Plan Note (Signed)
MMSE dropped from 01/02/19 29/30 to11/3/21 MMSE 18/30, failed clock drawing. 07/15/19 CT head Mild generalized parenchymal atrophy and chronic small vessel ischemic disease, tolerated Memantine well.

## 2020-10-24 NOTE — Assessment & Plan Note (Addendum)
unwitnessed fall when the patient was found sitting on the floor. The patient stated she lost balance and fell when she lifted her hand the bed rail. C/o R hip pain, but she transferred self from bed to recliner, no noted deformity of the left hip or knee. Unremarkable 10/19/20 CBC, BMP, obtain X-Cumming R hip, pelvis, lumbar spine to r/o fxs.  10/24/20 X-Eades R hip no acute bone abnormality. X-Boulet L spine: compression L2, L3 uncertain age.

## 2020-10-24 NOTE — Assessment & Plan Note (Addendum)
C/o R hip pain, but she transferred self from bed to recliner, no noted deformity of the right hip or knee. Pending CBC, BMP, X-Wacker R hip, pelvis, lumbar spine.  T5, T8, L3 compression fxs. Takes, Tylenol, Tramadol for pain.        DDD per CT head/cervical spine 08/02/20  10/24/20 X-Fenlon R hip no acute bone abnormality. X-Baka L spine: compression L2, L3 uncertain age.

## 2020-10-24 NOTE — Assessment & Plan Note (Signed)
on Allopurinol '100mg'$  qd.

## 2020-10-25 NOTE — Assessment & Plan Note (Signed)
feeling dizzy, not new, no new neurological symptoms, will try Meclizine 12.'5mg'$  qd x 72 hours, neuro check q shift x 72 hours.

## 2020-10-26 DIAGNOSIS — I129 Hypertensive chronic kidney disease with stage 1 through stage 4 chronic kidney disease, or unspecified chronic kidney disease: Secondary | ICD-10-CM | POA: Diagnosis not present

## 2020-10-26 DIAGNOSIS — F0390 Unspecified dementia without behavioral disturbance: Secondary | ICD-10-CM | POA: Diagnosis not present

## 2020-10-26 DIAGNOSIS — M5459 Other low back pain: Secondary | ICD-10-CM | POA: Diagnosis not present

## 2020-10-26 DIAGNOSIS — R41841 Cognitive communication deficit: Secondary | ICD-10-CM | POA: Diagnosis not present

## 2020-10-26 DIAGNOSIS — R1310 Dysphagia, unspecified: Secondary | ICD-10-CM | POA: Diagnosis not present

## 2020-10-26 DIAGNOSIS — R1312 Dysphagia, oropharyngeal phase: Secondary | ICD-10-CM | POA: Diagnosis not present

## 2020-10-29 DIAGNOSIS — R1312 Dysphagia, oropharyngeal phase: Secondary | ICD-10-CM | POA: Diagnosis not present

## 2020-10-29 DIAGNOSIS — R1310 Dysphagia, unspecified: Secondary | ICD-10-CM | POA: Diagnosis not present

## 2020-10-29 DIAGNOSIS — R41841 Cognitive communication deficit: Secondary | ICD-10-CM | POA: Diagnosis not present

## 2020-10-29 DIAGNOSIS — I129 Hypertensive chronic kidney disease with stage 1 through stage 4 chronic kidney disease, or unspecified chronic kidney disease: Secondary | ICD-10-CM | POA: Diagnosis not present

## 2020-10-29 DIAGNOSIS — M5459 Other low back pain: Secondary | ICD-10-CM | POA: Diagnosis not present

## 2020-10-29 DIAGNOSIS — F0390 Unspecified dementia without behavioral disturbance: Secondary | ICD-10-CM | POA: Diagnosis not present

## 2020-10-30 DIAGNOSIS — F0390 Unspecified dementia without behavioral disturbance: Secondary | ICD-10-CM | POA: Diagnosis not present

## 2020-10-30 DIAGNOSIS — R41841 Cognitive communication deficit: Secondary | ICD-10-CM | POA: Diagnosis not present

## 2020-10-30 DIAGNOSIS — R1310 Dysphagia, unspecified: Secondary | ICD-10-CM | POA: Diagnosis not present

## 2020-10-30 DIAGNOSIS — R1312 Dysphagia, oropharyngeal phase: Secondary | ICD-10-CM | POA: Diagnosis not present

## 2020-10-30 DIAGNOSIS — M5459 Other low back pain: Secondary | ICD-10-CM | POA: Diagnosis not present

## 2020-10-30 DIAGNOSIS — I129 Hypertensive chronic kidney disease with stage 1 through stage 4 chronic kidney disease, or unspecified chronic kidney disease: Secondary | ICD-10-CM | POA: Diagnosis not present

## 2020-10-31 DIAGNOSIS — M5459 Other low back pain: Secondary | ICD-10-CM | POA: Diagnosis not present

## 2020-10-31 DIAGNOSIS — R1310 Dysphagia, unspecified: Secondary | ICD-10-CM | POA: Diagnosis not present

## 2020-10-31 DIAGNOSIS — R1312 Dysphagia, oropharyngeal phase: Secondary | ICD-10-CM | POA: Diagnosis not present

## 2020-10-31 DIAGNOSIS — I129 Hypertensive chronic kidney disease with stage 1 through stage 4 chronic kidney disease, or unspecified chronic kidney disease: Secondary | ICD-10-CM | POA: Diagnosis not present

## 2020-10-31 DIAGNOSIS — R41841 Cognitive communication deficit: Secondary | ICD-10-CM | POA: Diagnosis not present

## 2020-10-31 DIAGNOSIS — F0390 Unspecified dementia without behavioral disturbance: Secondary | ICD-10-CM | POA: Diagnosis not present

## 2020-11-01 DIAGNOSIS — R1310 Dysphagia, unspecified: Secondary | ICD-10-CM | POA: Diagnosis not present

## 2020-11-01 DIAGNOSIS — M5459 Other low back pain: Secondary | ICD-10-CM | POA: Diagnosis not present

## 2020-11-01 DIAGNOSIS — I129 Hypertensive chronic kidney disease with stage 1 through stage 4 chronic kidney disease, or unspecified chronic kidney disease: Secondary | ICD-10-CM | POA: Diagnosis not present

## 2020-11-01 DIAGNOSIS — R1312 Dysphagia, oropharyngeal phase: Secondary | ICD-10-CM | POA: Diagnosis not present

## 2020-11-01 DIAGNOSIS — R41841 Cognitive communication deficit: Secondary | ICD-10-CM | POA: Diagnosis not present

## 2020-11-01 DIAGNOSIS — F0390 Unspecified dementia without behavioral disturbance: Secondary | ICD-10-CM | POA: Diagnosis not present

## 2020-11-02 DIAGNOSIS — M5459 Other low back pain: Secondary | ICD-10-CM | POA: Diagnosis not present

## 2020-11-02 DIAGNOSIS — F0390 Unspecified dementia without behavioral disturbance: Secondary | ICD-10-CM | POA: Diagnosis not present

## 2020-11-02 DIAGNOSIS — R1312 Dysphagia, oropharyngeal phase: Secondary | ICD-10-CM | POA: Diagnosis not present

## 2020-11-02 DIAGNOSIS — R41841 Cognitive communication deficit: Secondary | ICD-10-CM | POA: Diagnosis not present

## 2020-11-02 DIAGNOSIS — I129 Hypertensive chronic kidney disease with stage 1 through stage 4 chronic kidney disease, or unspecified chronic kidney disease: Secondary | ICD-10-CM | POA: Diagnosis not present

## 2020-11-02 DIAGNOSIS — R1310 Dysphagia, unspecified: Secondary | ICD-10-CM | POA: Diagnosis not present

## 2020-11-05 DIAGNOSIS — I129 Hypertensive chronic kidney disease with stage 1 through stage 4 chronic kidney disease, or unspecified chronic kidney disease: Secondary | ICD-10-CM | POA: Diagnosis not present

## 2020-11-05 DIAGNOSIS — R1310 Dysphagia, unspecified: Secondary | ICD-10-CM | POA: Diagnosis not present

## 2020-11-05 DIAGNOSIS — M5459 Other low back pain: Secondary | ICD-10-CM | POA: Diagnosis not present

## 2020-11-05 DIAGNOSIS — F0390 Unspecified dementia without behavioral disturbance: Secondary | ICD-10-CM | POA: Diagnosis not present

## 2020-11-05 DIAGNOSIS — R41841 Cognitive communication deficit: Secondary | ICD-10-CM | POA: Diagnosis not present

## 2020-11-05 DIAGNOSIS — R1312 Dysphagia, oropharyngeal phase: Secondary | ICD-10-CM | POA: Diagnosis not present

## 2020-11-06 DIAGNOSIS — R41841 Cognitive communication deficit: Secondary | ICD-10-CM | POA: Diagnosis not present

## 2020-11-06 DIAGNOSIS — M5459 Other low back pain: Secondary | ICD-10-CM | POA: Diagnosis not present

## 2020-11-06 DIAGNOSIS — F0390 Unspecified dementia without behavioral disturbance: Secondary | ICD-10-CM | POA: Diagnosis not present

## 2020-11-06 DIAGNOSIS — I129 Hypertensive chronic kidney disease with stage 1 through stage 4 chronic kidney disease, or unspecified chronic kidney disease: Secondary | ICD-10-CM | POA: Diagnosis not present

## 2020-11-06 DIAGNOSIS — R1310 Dysphagia, unspecified: Secondary | ICD-10-CM | POA: Diagnosis not present

## 2020-11-06 DIAGNOSIS — R1312 Dysphagia, oropharyngeal phase: Secondary | ICD-10-CM | POA: Diagnosis not present

## 2020-11-08 DIAGNOSIS — R29898 Other symptoms and signs involving the musculoskeletal system: Secondary | ICD-10-CM | POA: Diagnosis not present

## 2020-11-08 DIAGNOSIS — I129 Hypertensive chronic kidney disease with stage 1 through stage 4 chronic kidney disease, or unspecified chronic kidney disease: Secondary | ICD-10-CM | POA: Diagnosis not present

## 2020-11-08 DIAGNOSIS — R41841 Cognitive communication deficit: Secondary | ICD-10-CM | POA: Diagnosis not present

## 2020-11-08 DIAGNOSIS — F0390 Unspecified dementia without behavioral disturbance: Secondary | ICD-10-CM | POA: Diagnosis not present

## 2020-11-08 DIAGNOSIS — M6281 Muscle weakness (generalized): Secondary | ICD-10-CM | POA: Diagnosis not present

## 2020-11-08 DIAGNOSIS — M5459 Other low back pain: Secondary | ICD-10-CM | POA: Diagnosis not present

## 2020-11-08 DIAGNOSIS — C679 Malignant neoplasm of bladder, unspecified: Secondary | ICD-10-CM | POA: Diagnosis not present

## 2020-11-08 DIAGNOSIS — Z9181 History of falling: Secondary | ICD-10-CM | POA: Diagnosis not present

## 2020-11-08 DIAGNOSIS — R2681 Unsteadiness on feet: Secondary | ICD-10-CM | POA: Diagnosis not present

## 2020-11-08 DIAGNOSIS — R1310 Dysphagia, unspecified: Secondary | ICD-10-CM | POA: Diagnosis not present

## 2020-11-08 DIAGNOSIS — R1312 Dysphagia, oropharyngeal phase: Secondary | ICD-10-CM | POA: Diagnosis not present

## 2020-11-09 ENCOUNTER — Encounter: Payer: Self-pay | Admitting: Nurse Practitioner

## 2020-11-09 ENCOUNTER — Non-Acute Institutional Stay (SKILLED_NURSING_FACILITY): Payer: Medicare Other | Admitting: Nurse Practitioner

## 2020-11-09 DIAGNOSIS — R41841 Cognitive communication deficit: Secondary | ICD-10-CM | POA: Diagnosis not present

## 2020-11-09 DIAGNOSIS — I10 Essential (primary) hypertension: Secondary | ICD-10-CM | POA: Diagnosis not present

## 2020-11-09 DIAGNOSIS — E039 Hypothyroidism, unspecified: Secondary | ICD-10-CM

## 2020-11-09 DIAGNOSIS — G459 Transient cerebral ischemic attack, unspecified: Secondary | ICD-10-CM

## 2020-11-09 DIAGNOSIS — R2681 Unsteadiness on feet: Secondary | ICD-10-CM

## 2020-11-09 DIAGNOSIS — R1312 Dysphagia, oropharyngeal phase: Secondary | ICD-10-CM | POA: Diagnosis not present

## 2020-11-09 DIAGNOSIS — M109 Gout, unspecified: Secondary | ICD-10-CM

## 2020-11-09 DIAGNOSIS — Z8673 Personal history of transient ischemic attack (TIA), and cerebral infarction without residual deficits: Secondary | ICD-10-CM | POA: Diagnosis not present

## 2020-11-09 DIAGNOSIS — M6281 Muscle weakness (generalized): Secondary | ICD-10-CM | POA: Diagnosis not present

## 2020-11-09 DIAGNOSIS — M159 Polyosteoarthritis, unspecified: Secondary | ICD-10-CM

## 2020-11-09 DIAGNOSIS — N1831 Chronic kidney disease, stage 3a: Secondary | ICD-10-CM

## 2020-11-09 DIAGNOSIS — I129 Hypertensive chronic kidney disease with stage 1 through stage 4 chronic kidney disease, or unspecified chronic kidney disease: Secondary | ICD-10-CM | POA: Diagnosis not present

## 2020-11-09 DIAGNOSIS — R1310 Dysphagia, unspecified: Secondary | ICD-10-CM | POA: Diagnosis not present

## 2020-11-09 DIAGNOSIS — F0150 Vascular dementia without behavioral disturbance: Secondary | ICD-10-CM | POA: Diagnosis not present

## 2020-11-09 DIAGNOSIS — F0390 Unspecified dementia without behavioral disturbance: Secondary | ICD-10-CM | POA: Diagnosis not present

## 2020-11-09 NOTE — Progress Notes (Signed)
Location:   [facility] Room Number: N040 Place of Service:  SNF (31)  Provider: Zyla Dascenzo Otho Darner, NP  PCP: Virgie Dad, MD Patient Care Team: Virgie Dad, MD as PCP - General (Internal Medicine) Samai Corea X, NP as Nurse Practitioner (Internal Medicine) Virgie Dad, MD as Consulting Physician (Internal Medicine)  Extended Emergency Contact Information Primary Emergency Contact: Tubbs,[name]  Johnnette Litter of [place] Phone: [phone] Relation: Son  Code Status: DNR Goals of care:  Advanced Directive information Advanced Directives 11/09/2020  Does Patient Have a Medical Advance Directive? Yes  Type of Advance Directive [place]  Does patient want to make changes to medical advance directive? No - Patient declined  Copy of [place] in Chart? Yes - validated most recent copy scanned in chart (See row information)  Would patient like information on creating a medical advance directive? -  Pre-existing out of facility DNR order (yellow form or pink MOST form) -     Allergies  Allergen Reactions   Actonel [Risedronate Sodium]    Hct [Hydrochlorothiazide]    Lipitor [Atorvastatin Calcium]    Neomycin Other (See Comments)    Unknown "been so long ago, a Dermatologist told me"   Polysporin [Bacitracin-Polymyxin B]    Prolia [Denosumab]    Sulfa Antibiotics    Zocor [Simvastatin]     Chief Complaint  Patient presents with   Discharge Note    Patient being discharged    HPI:  87 y.o. female  with past medical history of TIA/CVA, chronic mid to lower back pain, DDD cervical spine, gout, CKD, dementia,  HTN, hypothyroidism was admitted to [facility] for supportive care and receiving therapy following her thoracic vertebral body compression fracture and deconditioning. The patient's back pain is better controlled, she has regained her physical strength, ADL function to her baseline. The patient is ready to  return to [place] where she resided prior.    Gait abnormality, uses walker to ambulate.               Hx of TIA. Allergic to statin. On ASA.  HPOA desired not hospital evaluation              T5, T8, L3 compression fxs. Takes, Tylenol, Tramadol for pain.                    DDD per CT head/cervical spine 08/02/20              Hx of CVA per CT head 08/02/20 Remote infarcts within the a occipital cortices bilaterally again noted. Interval development of remote infarcts within the left parietal lobe and periventricular white matte             Vascular dementia, MMSE dropped from 01/02/19 29/30 to11/3/21 MMSE 18/30, failed clock drawing. 07/15/19 CT head Mild generalized parenchymal atrophy and chronic small vessel ischemic disease, tolerated Memantine well.                          HTN, blood pressure is controlled on Benazepril '10mg'$  qd. Bun/creat 14/0.9 10/22/20             CKD Bun/creat 14/0.9 10/22/20             Gout stable, on Allopurinol '100mg'$  qd.  Hypothyroidism, stable, on Levothyroxine. TSH 2.34 06/22/20    Past Medical History:  Diagnosis Date   Cognitive changes    Dementia ([place])    Depression    Gout    Hypertension    Hypothyroidism    Peripheral neuropathy     History reviewed. No pertinent surgical history.    reports that she has never smoked. She has never used smokeless tobacco. She reports that she does not drink alcohol and does not use drugs. Social History   Socioeconomic History   Marital status: Widowed    Spouse name: Not on file   Number of children: Not on file   Years of education: Not on file   Highest education level: Not on file  Occupational History   Not on file  Tobacco Use   Smoking status: Never   Smokeless tobacco: Never  Substance and Sexual Activity   Alcohol use: Never   Drug use: Never   Sexual activity: Not on file  Other Topics Concern   Not on file  Social History Narrative   Not on file   Social Determinants of  Health   Financial Resource Strain: Not on file  Food Insecurity: Not on file  Transportation Needs: Not on file  Physical Activity: Not on file  Stress: Not on file  Social Connections: Not on file  Intimate Partner Violence: Not on file   Functional Status Survey:    Allergies  Allergen Reactions   Actonel [Risedronate Sodium]    Hct [Hydrochlorothiazide]    Lipitor [Atorvastatin Calcium]    Neomycin Other (See Comments)    Unknown "been so long ago, a Dermatologist told me"   Polysporin [Bacitracin-Polymyxin B]    Prolia [Denosumab]    Sulfa Antibiotics    Zocor [Simvastatin]     Pertinent  Health Maintenance Due  Topic Date Due   DEXA SCAN  Never done   PNA vac Low Risk Adult (1 of 2 - PCV13) Never done   INFLUENZA VACCINE  10/08/2020    Medications: Allergies as of 11/09/2020       Reactions   Actonel [risedronate Sodium]    Hct [hydrochlorothiazide]    Lipitor [atorvastatin Calcium]    Neomycin Other (See Comments)   Unknown "been so long ago, a Dermatologist told me"   Polysporin [bacitracin-polymyxin B]    Prolia [denosumab]    Sulfa Antibiotics    Zocor [simvastatin]         Medication List        Accurate as of November 09, 2020 11:59 PM. If you have any questions, ask your nurse or doctor.          STOP taking these medications    meclizine 25 MG tablet Commonly known as: ANTIVERT Stopped by: Felipe Paluch X Kallen Delatorre, NP       TAKE these medications    acetaminophen 325 MG tablet Commonly known as: TYLENOL Take 650 mg by mouth in the morning, at noon, and at bedtime.   allopurinol 100 MG tablet Commonly known as: ZYLOPRIM Take 100 mg by mouth daily.   aspirin 81 MG chewable tablet Chew 81 mg by mouth daily.   benazepril 10 MG tablet Commonly known as: LOTENSIN Take 10 mg by mouth daily.   Calcium Carb-Cholecalciferol 500-400 MG-UNIT Tabs Take 2 tablets by mouth daily.   cholecalciferol 25 MCG (1000 UNIT) tablet Commonly known as:  VITAMIN D3 Take 1,000 Units by mouth 2 (two) times daily.   CoQ10 100 MG Caps  Take 2 capsules by mouth daily.   levothyroxine 100 MCG tablet Commonly known as: SYNTHROID Take 100 mcg by mouth once a week. On Wednesday   levothyroxine 50 MCG tablet Commonly known as: SYNTHROID Take 50 mcg by mouth daily before breakfast. Sun,Mon,Tue, Thur,Fri,Sat   memantine 10 MG tablet Commonly known as: NAMENDA Take 10 mg by mouth 2 (two) times daily.   Omega-3 1000 MG Caps Take 2 capsules by mouth 2 (two) times daily.   ondansetron 4 MG tablet Commonly known as: ZOFRAN Take 4 mg by mouth every 8 (eight) hours as needed.   traMADol 50 MG tablet Commonly known as: ULTRAM Take 1 tablet (50 mg total) by mouth 3 (three) times daily with meals.        Review of Systems  Constitutional:  Negative for activity change, appetite change and fever.  HENT:  Positive for hearing loss. Negative for congestion and voice change.   Eyes:  Negative for visual disturbance.  Respiratory:  Negative for shortness of breath.   Cardiovascular:  Positive for leg swelling.  Gastrointestinal:  Negative for abdominal pain and constipation.  Genitourinary:  Negative for dysuria and urgency.  Musculoskeletal:  Positive for arthralgias, back pain and gait problem.       Positional lower  portion of the mid back pain. No c/o neck, right hip, or lower back pain.   Skin:  Negative for color change.  Neurological:  Negative for speech difficulty, weakness and light-headedness.  Psychiatric/Behavioral:  Positive for confusion. Negative for behavioral problems and sleep disturbance. The patient is not nervous/anxious.    Vitals:   11/09/20 1040  BP: (!) 146/84  Pulse: 74  Resp: 18  Temp: 97.8 F (36.6 C)  SpO2: 96%  Weight: 139 lb (63 kg)  Height: '5\' 4"'$  (1.626 m)   Body mass index is 23.86 kg/m. Physical Exam Vitals and nursing note reviewed.  Constitutional:      Appearance: Normal appearance.  HENT:      Head: Normocephalic and atraumatic.     Mouth/Throat:     Mouth: Mucous membranes are moist.  Eyes:     Extraocular Movements: Extraocular movements intact.     Conjunctiva/sclera: Conjunctivae normal.     Pupils: Pupils are equal, round, and reactive to light.  Cardiovascular:     Rate and Rhythm: Normal rate and regular rhythm.     Heart sounds: No murmur heard. Pulmonary:     Effort: Pulmonary effort is normal.     Breath sounds: No rales.  Abdominal:     General: Bowel sounds are normal.     Palpations: Abdomen is soft.     Tenderness: There is no abdominal tenderness.  Musculoskeletal:        General: Tenderness present.     Cervical back: Normal range of motion and neck supple.     Right lower leg: No edema.     Left lower leg: No edema.     Comments: C/o lower portion of the mid back pain with position changing.   Skin:    General: Skin is warm and dry.  Neurological:     General: No focal deficit present.     Mental Status: She is alert. Mental status is at baseline.     Motor: No weakness.     Coordination: Coordination normal.     Gait: Gait abnormal.  Psychiatric:        Mood and Affect: Mood normal.        Behavior:  Behavior normal.    Labs reviewed: Basic Metabolic Panel: Recent Labs    06/22/20 0011 09/25/20 0000 10/22/20 0000  NA 142 141 139  K 3.8 3.6 3.9  CL 106 106 103  CO2 25* 26* 25*  BUN 25* 16 14  CREATININE 1.2* 0.9 0.9  CALCIUM 9.4 9.4 9.4   Liver Function Tests: Recent Labs    06/22/20 0011 09/25/20 0000 10/22/20 0000  AST '17 15 18  '$ ALT '12 11 11  '$ ALKPHOS 81 101 105  ALBUMIN 4.1 3.8 4.1   No results for input(s): LIPASE, AMYLASE in the last 8760 hours. No results for input(s): AMMONIA in the last 8760 hours. CBC: Recent Labs    12/28/19 0000 01/11/20 0000 06/22/20 0011 09/25/20 0000 10/22/20 0000  WBC 8.4   < > 8.7 8.6 8.7  NEUTROABS 6,065.00  --   --  6,364.00 7,230.00  HGB 13.4   < > 13.9 13.1 15.2  HCT 40   < > 42  40 46  PLT 220   < > 209 264 274   < > = values in this interval not displayed.   Cardiac Enzymes: No results for input(s): CKTOTAL, CKMB, CKMBINDEX, TROPONINI in the last 8760 hours. BNP: Invalid input(s): POCBNP CBG: No results for input(s): GLUCAP in the last 8760 hours.  Procedures and Imaging Studies During Stay: No results found.  Assessment/Plan:   Gait instability uses walker to ambulate.                TIA (transient ischemic attack) Allergic to statin. On ASA.  HPOA desired not hospital evaluation  Generalized osteoarthritis of multiple sites T5, T8, L3 compression fxs. Takes, Tylenol, Tramadol for pain. DDD per CT head/cervical spine 08/02/20   History of CVA (cerebrovascular accident) Hx of CVA per CT head 08/02/20 Remote infarcts within the a occipital cortices bilaterally again noted. Interval development of remote infarcts within the left parietal lobe and periventricular white matte  Vascular dementia (HCC) Vascular dementia, MMSE dropped from 01/02/19 29/30 to11/3/21 MMSE 18/30, failed clock drawing. 07/15/19 CT head Mild generalized parenchymal atrophy and chronic small vessel ischemic disease, tolerated Memantine well.       HTN (hypertension) blood pressure is controlled on Benazepril '10mg'$  qd. Bun/creat 14/0.9 10/22/20  CKD (chronic kidney disease) stage 3, GFR 30-59 ml/min (HCC) Bun/creat 14/0.9 10/22/20  Gout stable, on Allopurinol '100mg'$  qd.      Hypothyroidism stable, on Levothyroxine. TSH 2.34 06/22/20    Patient is being discharged with the following home health services:    Patient is being discharged with the following durable medical equipment:    Patient has been advised to f/u with their PCP in 1-2 weeks to bring them up to date on their rehab stay.  Social services at facility was responsible for arranging this appointment.  Pt was provided with a 30 day supply of prescriptions for medications and refills must be obtained from their PCP.  For  controlled substances, a more limited supply may be provided adequate until PCP appointment only.  Future labs/tests needed:  prn

## 2020-11-09 NOTE — Assessment & Plan Note (Signed)
T5, T8, L3 compression fxs. Takes, Tylenol, Tramadol for pain. DDD per CT head/cervical spine 08/02/20

## 2020-11-09 NOTE — Assessment & Plan Note (Signed)
stable, on Allopurinol '100mg'$  qd.

## 2020-11-09 NOTE — Assessment & Plan Note (Signed)
Allergic to statin. On ASA.  HPOA desired not hospital evaluation

## 2020-11-09 NOTE — Assessment & Plan Note (Signed)
uses walker to ambulate.       

## 2020-11-09 NOTE — Assessment & Plan Note (Signed)
stable, on Levothyroxine. TSH 2.34 06/22/20

## 2020-11-09 NOTE — Assessment & Plan Note (Signed)
blood pressure is controlled on Benazepril '10mg'$  qd. Bun/creat 14/0.9 10/22/20

## 2020-11-09 NOTE — Assessment & Plan Note (Signed)
Vascular dementia, MMSE dropped from 01/02/19 29/30 to11/3/21 MMSE 18/30, failed clock drawing. 07/15/19 CT head Mild generalized parenchymal atrophy and chronic small vessel ischemic disease, tolerated Memantine well.

## 2020-11-09 NOTE — Assessment & Plan Note (Signed)
Bun/creat 14/0.9 10/22/20

## 2020-11-09 NOTE — Assessment & Plan Note (Signed)
Hx of CVA per CT head 08/02/20 Remote infarcts within the a occipital cortices bilaterally again noted. Interval development of remote infarcts within the left parietal lobe and periventricular white matte

## 2020-11-12 DIAGNOSIS — F0390 Unspecified dementia without behavioral disturbance: Secondary | ICD-10-CM | POA: Diagnosis not present

## 2020-11-12 DIAGNOSIS — M6281 Muscle weakness (generalized): Secondary | ICD-10-CM | POA: Diagnosis not present

## 2020-11-12 DIAGNOSIS — R1310 Dysphagia, unspecified: Secondary | ICD-10-CM | POA: Diagnosis not present

## 2020-11-12 DIAGNOSIS — R1312 Dysphagia, oropharyngeal phase: Secondary | ICD-10-CM | POA: Diagnosis not present

## 2020-11-12 DIAGNOSIS — R41841 Cognitive communication deficit: Secondary | ICD-10-CM | POA: Diagnosis not present

## 2020-11-12 DIAGNOSIS — I129 Hypertensive chronic kidney disease with stage 1 through stage 4 chronic kidney disease, or unspecified chronic kidney disease: Secondary | ICD-10-CM | POA: Diagnosis not present

## 2020-11-13 DIAGNOSIS — I129 Hypertensive chronic kidney disease with stage 1 through stage 4 chronic kidney disease, or unspecified chronic kidney disease: Secondary | ICD-10-CM | POA: Diagnosis not present

## 2020-11-13 DIAGNOSIS — F0390 Unspecified dementia without behavioral disturbance: Secondary | ICD-10-CM | POA: Diagnosis not present

## 2020-11-13 DIAGNOSIS — M6281 Muscle weakness (generalized): Secondary | ICD-10-CM | POA: Diagnosis not present

## 2020-11-13 DIAGNOSIS — R1310 Dysphagia, unspecified: Secondary | ICD-10-CM | POA: Diagnosis not present

## 2020-11-13 DIAGNOSIS — R41841 Cognitive communication deficit: Secondary | ICD-10-CM | POA: Diagnosis not present

## 2020-11-13 DIAGNOSIS — R1312 Dysphagia, oropharyngeal phase: Secondary | ICD-10-CM | POA: Diagnosis not present

## 2020-11-14 ENCOUNTER — Encounter: Payer: Self-pay | Admitting: Nurse Practitioner

## 2020-11-14 ENCOUNTER — Other Ambulatory Visit: Payer: Self-pay | Admitting: *Deleted

## 2020-11-14 MED ORDER — TRAMADOL HCL 50 MG PO TABS: 50.0000 mg | ORAL_TABLET | Freq: Three times a day (TID) | ORAL | 0 refills | Status: DC

## 2020-11-14 NOTE — Telephone Encounter (Signed)
Received refill Request from [facility] and sent to [facility] for approval.

## 2020-11-16 DIAGNOSIS — R42 Dizziness and giddiness: Secondary | ICD-10-CM | POA: Diagnosis not present

## 2020-11-16 DIAGNOSIS — R269 Unspecified abnormalities of gait and mobility: Secondary | ICD-10-CM | POA: Diagnosis not present

## 2020-11-16 DIAGNOSIS — M6281 Muscle weakness (generalized): Secondary | ICD-10-CM | POA: Diagnosis not present

## 2020-11-16 DIAGNOSIS — R2681 Unsteadiness on feet: Secondary | ICD-10-CM | POA: Diagnosis not present

## 2020-11-16 DIAGNOSIS — R296 Repeated falls: Secondary | ICD-10-CM | POA: Diagnosis not present

## 2020-11-20 DIAGNOSIS — R269 Unspecified abnormalities of gait and mobility: Secondary | ICD-10-CM | POA: Diagnosis not present

## 2020-11-20 DIAGNOSIS — M6281 Muscle weakness (generalized): Secondary | ICD-10-CM | POA: Diagnosis not present

## 2020-11-20 DIAGNOSIS — R2681 Unsteadiness on feet: Secondary | ICD-10-CM | POA: Diagnosis not present

## 2020-11-20 DIAGNOSIS — R42 Dizziness and giddiness: Secondary | ICD-10-CM | POA: Diagnosis not present

## 2020-11-20 DIAGNOSIS — R296 Repeated falls: Secondary | ICD-10-CM | POA: Diagnosis not present

## 2020-11-21 DIAGNOSIS — R296 Repeated falls: Secondary | ICD-10-CM | POA: Diagnosis not present

## 2020-11-21 DIAGNOSIS — M6281 Muscle weakness (generalized): Secondary | ICD-10-CM | POA: Diagnosis not present

## 2020-11-21 DIAGNOSIS — R2681 Unsteadiness on feet: Secondary | ICD-10-CM | POA: Diagnosis not present

## 2020-11-21 DIAGNOSIS — R42 Dizziness and giddiness: Secondary | ICD-10-CM | POA: Diagnosis not present

## 2020-11-21 DIAGNOSIS — R269 Unspecified abnormalities of gait and mobility: Secondary | ICD-10-CM | POA: Diagnosis not present

## 2020-11-22 DIAGNOSIS — R2681 Unsteadiness on feet: Secondary | ICD-10-CM | POA: Diagnosis not present

## 2020-11-22 DIAGNOSIS — R42 Dizziness and giddiness: Secondary | ICD-10-CM | POA: Diagnosis not present

## 2020-11-22 DIAGNOSIS — R296 Repeated falls: Secondary | ICD-10-CM | POA: Diagnosis not present

## 2020-11-22 DIAGNOSIS — M6281 Muscle weakness (generalized): Secondary | ICD-10-CM | POA: Diagnosis not present

## 2020-11-22 DIAGNOSIS — R269 Unspecified abnormalities of gait and mobility: Secondary | ICD-10-CM | POA: Diagnosis not present

## 2020-11-24 ENCOUNTER — Other Ambulatory Visit: Payer: Self-pay

## 2020-11-24 ENCOUNTER — Encounter (HOSPITAL_COMMUNITY): Payer: Self-pay | Admitting: Emergency Medicine

## 2020-11-24 ENCOUNTER — Emergency Department (HOSPITAL_COMMUNITY): Payer: Medicare Other

## 2020-11-24 ENCOUNTER — Inpatient Hospital Stay (HOSPITAL_COMMUNITY)
Admission: EM | Admit: 2020-11-24 | Discharge: 2020-11-28 | DRG: 101 | Disposition: A | Discharge status: Skilled Nursing Facility | Payer: Medicare Other | Attending: Family Medicine | Admitting: Family Medicine

## 2020-11-24 ENCOUNTER — Other Ambulatory Visit (HOSPITAL_COMMUNITY): Payer: Medicare Other

## 2020-11-24 ENCOUNTER — Observation Stay (HOSPITAL_COMMUNITY): Payer: Medicare Other

## 2020-11-24 DIAGNOSIS — E876 Hypokalemia: Secondary | ICD-10-CM | POA: Diagnosis present

## 2020-11-24 DIAGNOSIS — I160 Hypertensive urgency: Secondary | ICD-10-CM | POA: Diagnosis present

## 2020-11-24 DIAGNOSIS — R569 Unspecified convulsions: Secondary | ICD-10-CM

## 2020-11-24 DIAGNOSIS — R918 Other nonspecific abnormal finding of lung field: Secondary | ICD-10-CM

## 2020-11-24 DIAGNOSIS — Z20822 Contact with and (suspected) exposure to covid-19: Secondary | ICD-10-CM | POA: Diagnosis present

## 2020-11-24 DIAGNOSIS — Z7982 Long term (current) use of aspirin: Secondary | ICD-10-CM

## 2020-11-24 DIAGNOSIS — J9811 Atelectasis: Secondary | ICD-10-CM | POA: Diagnosis present

## 2020-11-24 DIAGNOSIS — F0151 Vascular dementia with behavioral disturbance: Secondary | ICD-10-CM | POA: Diagnosis not present

## 2020-11-24 DIAGNOSIS — Z66 Do not resuscitate: Secondary | ICD-10-CM | POA: Diagnosis not present

## 2020-11-24 DIAGNOSIS — M4312 Spondylolisthesis, cervical region: Secondary | ICD-10-CM | POA: Diagnosis not present

## 2020-11-24 DIAGNOSIS — N179 Acute kidney failure, unspecified: Secondary | ICD-10-CM | POA: Diagnosis present

## 2020-11-24 DIAGNOSIS — Z882 Allergy status to sulfonamides status: Secondary | ICD-10-CM

## 2020-11-24 DIAGNOSIS — G8929 Other chronic pain: Secondary | ICD-10-CM | POA: Diagnosis present

## 2020-11-24 DIAGNOSIS — W19XXXA Unspecified fall, initial encounter: Secondary | ICD-10-CM

## 2020-11-24 DIAGNOSIS — I517 Cardiomegaly: Secondary | ICD-10-CM | POA: Diagnosis not present

## 2020-11-24 DIAGNOSIS — G629 Polyneuropathy, unspecified: Secondary | ICD-10-CM | POA: Diagnosis present

## 2020-11-24 DIAGNOSIS — M549 Dorsalgia, unspecified: Secondary | ICD-10-CM | POA: Diagnosis present

## 2020-11-24 DIAGNOSIS — F0391 Unspecified dementia with behavioral disturbance: Secondary | ICD-10-CM

## 2020-11-24 DIAGNOSIS — M4856XA Collapsed vertebra, not elsewhere classified, lumbar region, initial encounter for fracture: Secondary | ICD-10-CM | POA: Diagnosis not present

## 2020-11-24 DIAGNOSIS — S199XXA Unspecified injury of neck, initial encounter: Secondary | ICD-10-CM | POA: Diagnosis not present

## 2020-11-24 DIAGNOSIS — D72829 Elevated white blood cell count, unspecified: Secondary | ICD-10-CM | POA: Diagnosis not present

## 2020-11-24 DIAGNOSIS — W050XXA Fall from non-moving wheelchair, initial encounter: Secondary | ICD-10-CM | POA: Diagnosis present

## 2020-11-24 DIAGNOSIS — F03918 Unspecified dementia, unspecified severity, with other behavioral disturbance: Secondary | ICD-10-CM | POA: Diagnosis present

## 2020-11-24 DIAGNOSIS — R404 Transient alteration of awareness: Secondary | ICD-10-CM | POA: Diagnosis not present

## 2020-11-24 DIAGNOSIS — E039 Hypothyroidism, unspecified: Secondary | ICD-10-CM | POA: Diagnosis not present

## 2020-11-24 DIAGNOSIS — M109 Gout, unspecified: Secondary | ICD-10-CM | POA: Diagnosis present

## 2020-11-24 DIAGNOSIS — R561 Post traumatic seizures: Principal | ICD-10-CM | POA: Diagnosis present

## 2020-11-24 DIAGNOSIS — R42 Dizziness and giddiness: Secondary | ICD-10-CM | POA: Diagnosis not present

## 2020-11-24 DIAGNOSIS — E8809 Other disorders of plasma-protein metabolism, not elsewhere classified: Secondary | ICD-10-CM | POA: Diagnosis present

## 2020-11-24 DIAGNOSIS — I10 Essential (primary) hypertension: Secondary | ICD-10-CM | POA: Diagnosis not present

## 2020-11-24 DIAGNOSIS — R4182 Altered mental status, unspecified: Secondary | ICD-10-CM

## 2020-11-24 DIAGNOSIS — N1832 Chronic kidney disease, stage 3b: Secondary | ICD-10-CM | POA: Diagnosis present

## 2020-11-24 DIAGNOSIS — Z888 Allergy status to other drugs, medicaments and biological substances status: Secondary | ICD-10-CM

## 2020-11-24 DIAGNOSIS — I129 Hypertensive chronic kidney disease with stage 1 through stage 4 chronic kidney disease, or unspecified chronic kidney disease: Secondary | ICD-10-CM | POA: Diagnosis present

## 2020-11-24 DIAGNOSIS — M40202 Unspecified kyphosis, cervical region: Secondary | ICD-10-CM | POA: Diagnosis not present

## 2020-11-24 DIAGNOSIS — Z8673 Personal history of transient ischemic attack (TIA), and cerebral infarction without residual deficits: Secondary | ICD-10-CM

## 2020-11-24 DIAGNOSIS — R55 Syncope and collapse: Secondary | ICD-10-CM | POA: Diagnosis not present

## 2020-11-24 DIAGNOSIS — N189 Chronic kidney disease, unspecified: Secondary | ICD-10-CM | POA: Diagnosis present

## 2020-11-24 DIAGNOSIS — Z881 Allergy status to other antibiotic agents status: Secondary | ICD-10-CM

## 2020-11-24 DIAGNOSIS — R0902 Hypoxemia: Secondary | ICD-10-CM | POA: Diagnosis not present

## 2020-11-24 DIAGNOSIS — Z79899 Other long term (current) drug therapy: Secondary | ICD-10-CM

## 2020-11-24 DIAGNOSIS — Z79890 Hormone replacement therapy (postmenopausal): Secondary | ICD-10-CM

## 2020-11-24 DIAGNOSIS — Y92009 Unspecified place in unspecified non-institutional (private) residence as the place of occurrence of the external cause: Secondary | ICD-10-CM

## 2020-11-24 DIAGNOSIS — F32A Depression, unspecified: Secondary | ICD-10-CM | POA: Diagnosis present

## 2020-11-24 LAB — CBC WITH DIFFERENTIAL/PLATELET
Abs Immature Granulocytes: 0.05 10*3/uL (ref 0.00–0.07)
Basophils Absolute: 0.1 10*3/uL (ref 0.0–0.1)
Basophils Relative: 1 %
Eosinophils Absolute: 0.2 10*3/uL (ref 0.0–0.5)
Eosinophils Relative: 2 %
HCT: 42.3 % (ref 36.0–46.0)
Hemoglobin: 13.8 g/dL (ref 12.0–15.0)
Immature Granulocytes: 0 %
Lymphocytes Relative: 8 %
Lymphs Abs: 1.1 10*3/uL (ref 0.7–4.0)
MCH: 33.2 pg (ref 26.0–34.0)
MCHC: 32.6 g/dL (ref 30.0–36.0)
MCV: 101.7 fL — ABNORMAL HIGH (ref 80.0–100.0)
Monocytes Absolute: 0.9 10*3/uL (ref 0.1–1.0)
Monocytes Relative: 7 %
Neutro Abs: 10.4 10*3/uL — ABNORMAL HIGH (ref 1.7–7.7)
Neutrophils Relative %: 82 %
Platelets: 249 10*3/uL (ref 150–400)
RBC: 4.16 MIL/uL (ref 3.87–5.11)
RDW: 14.6 % (ref 11.5–15.5)
WBC: 12.7 10*3/uL — ABNORMAL HIGH (ref 4.0–10.5)
nRBC: 0 % (ref 0.0–0.2)

## 2020-11-24 LAB — COMPREHENSIVE METABOLIC PANEL
ALT: 11 U/L (ref 0–44)
AST: 22 U/L (ref 15–41)
Albumin: 3.4 g/dL — ABNORMAL LOW (ref 3.5–5.0)
Alkaline Phosphatase: 87 U/L (ref 38–126)
Anion gap: 13 (ref 5–15)
BUN: 12 mg/dL (ref 8–23)
CO2: 25 mmol/L (ref 22–32)
Calcium: 9.1 mg/dL (ref 8.9–10.3)
Chloride: 103 mmol/L (ref 98–111)
Creatinine, Ser: 1.37 mg/dL — ABNORMAL HIGH (ref 0.44–1.00)
GFR, Estimated: 36 mL/min — ABNORMAL LOW (ref >60)
Glucose, Bld: 107 mg/dL — ABNORMAL HIGH (ref 70–99)
Potassium: 3 mmol/L — ABNORMAL LOW (ref 3.5–5.1)
Sodium: 141 mmol/L (ref 135–145)
Total Bilirubin: 0.7 mg/dL (ref 0.3–1.2)
Total Protein: 6.3 g/dL — ABNORMAL LOW (ref 6.5–8.1)

## 2020-11-24 LAB — CK: Total CK: 111 U/L (ref 38–234)

## 2020-11-24 LAB — RESP PANEL BY RT-PCR (FLU A&B, COVID) ARPGX2
Influenza A by PCR: NEGATIVE
Influenza B by PCR: NEGATIVE
SARS Coronavirus 2 by RT PCR: NEGATIVE

## 2020-11-24 LAB — URINALYSIS, ROUTINE W REFLEX MICROSCOPIC
Bilirubin Urine: NEGATIVE
Glucose, UA: NEGATIVE mg/dL
Hgb urine dipstick: NEGATIVE
Ketones, ur: NEGATIVE mg/dL
Leukocytes,Ua: NEGATIVE
Nitrite: NEGATIVE
Protein, ur: NEGATIVE mg/dL
Specific Gravity, Urine: 1.01 (ref 1.005–1.030)
pH: 7 (ref 5.0–8.0)

## 2020-11-24 LAB — PROTIME-INR
INR: 1 (ref 0.8–1.2)
Prothrombin Time: 13.4 seconds (ref 11.4–15.2)

## 2020-11-24 LAB — MAGNESIUM: Magnesium: 1.8 mg/dL (ref 1.7–2.4)

## 2020-11-24 IMAGING — CT CT CERVICAL SPINE W/O CM
4 series · 15 of 33 positions shown, 18 images · non-contrast
Comparison: CT cervical spine [DATE]

CLINICAL DATA: Trauma, fall

EXAM:
CT CERVICAL SPINE WITHOUT CONTRAST
TECHNIQUE: Multidetector CT imaging of the cervical spine was performed without
intravenous contrast. Multiplanar CT image reconstructions were also
generated.

[Series 5: c spine soft · axial · 0.30mm/px · z∈[+1192,+1220]mm · 2 of 84 slices shown]
[im 14/84  soft-tissue]
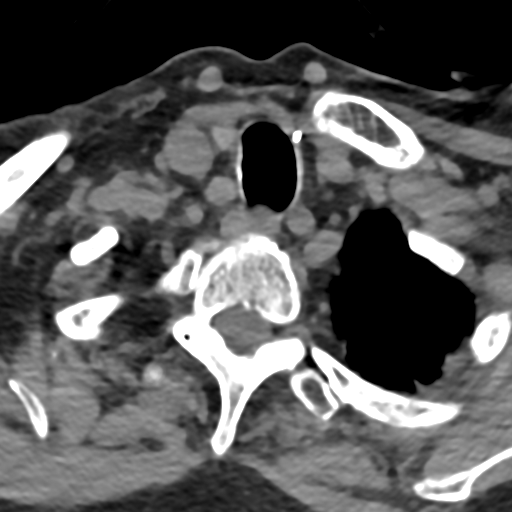
[im 28/84  soft-tissue]
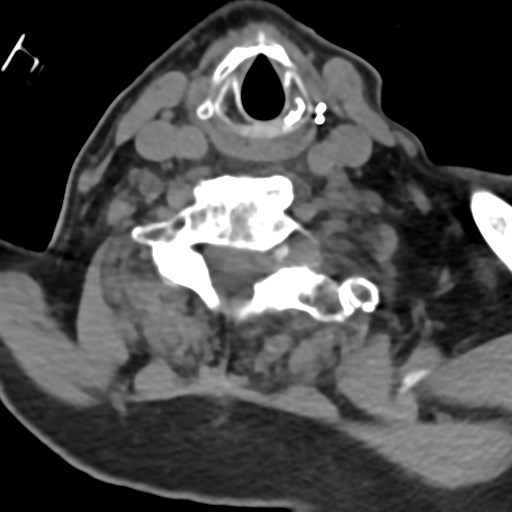

[Series 8: sag bone · sagittal · 0.33mm/px · 5 of 76 slices shown, 6 images]
[im 26/76  bone]
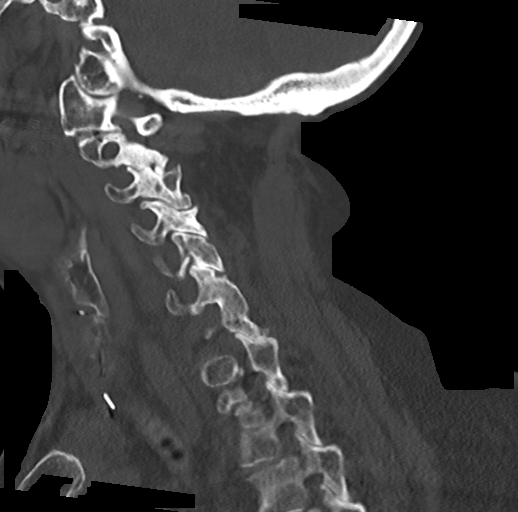
[im 32/76  bone]
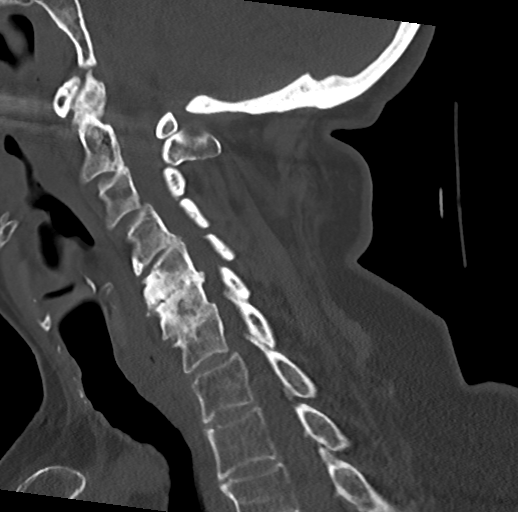
[im 38/76  soft-tissue]
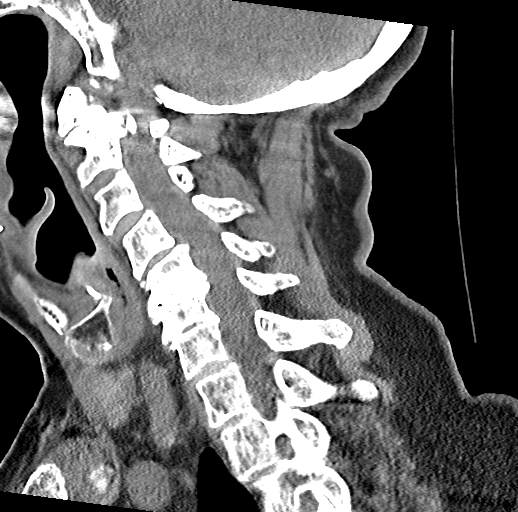
[im 38/76  bone]
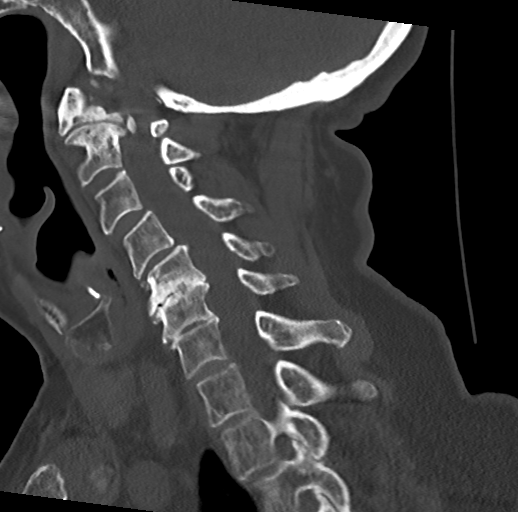
[im 44/76  bone]
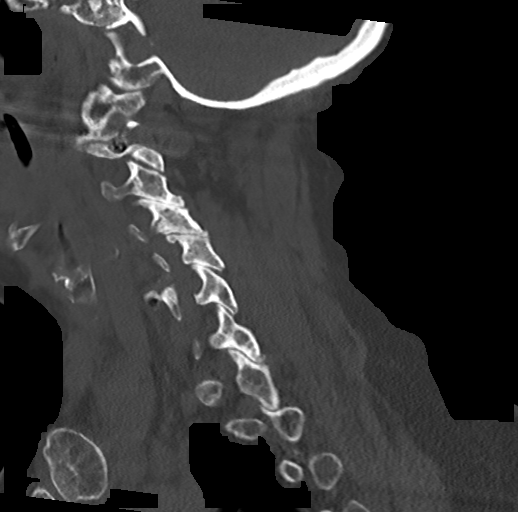
[im 51/76  bone]
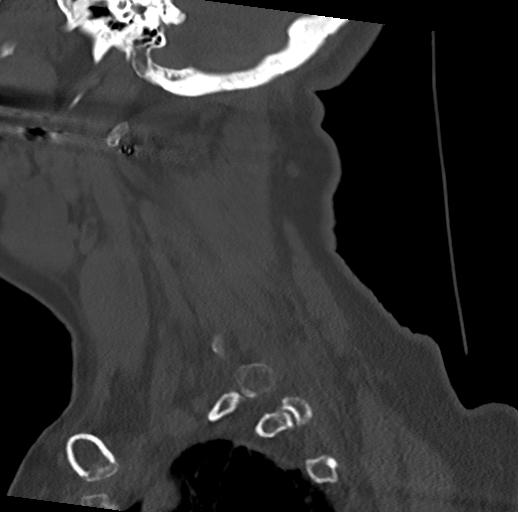

[Series 9: cor bone · coronal · 0.31mm/px · 3 of 84 slices shown]
[im 17/84  bone]
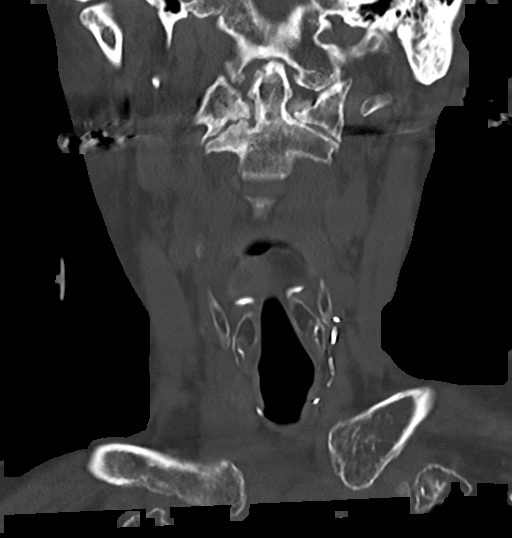
[im 34/84  bone]
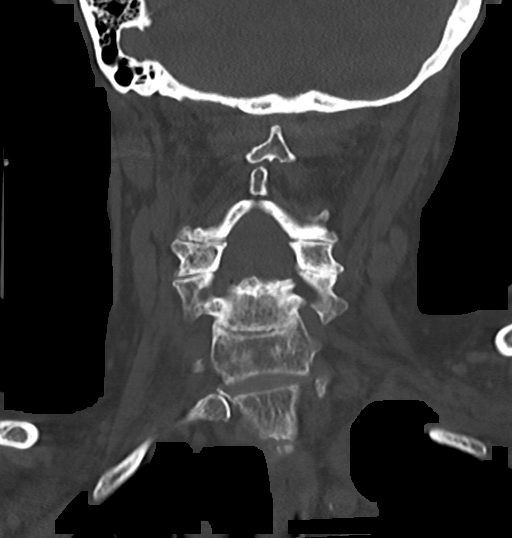
[im 50/84  bone]
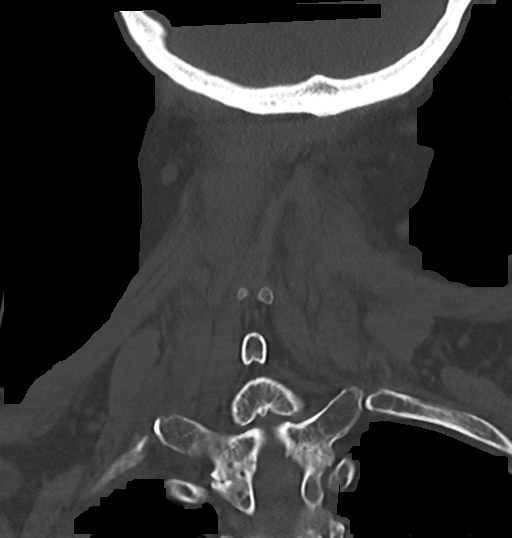

[Series 10: orthogonal axials · axial · 0.21mm/px · z∈[+1174,+1279]mm · 5 of 92 slices shown, 7 images]
[im 16/92  soft-tissue]
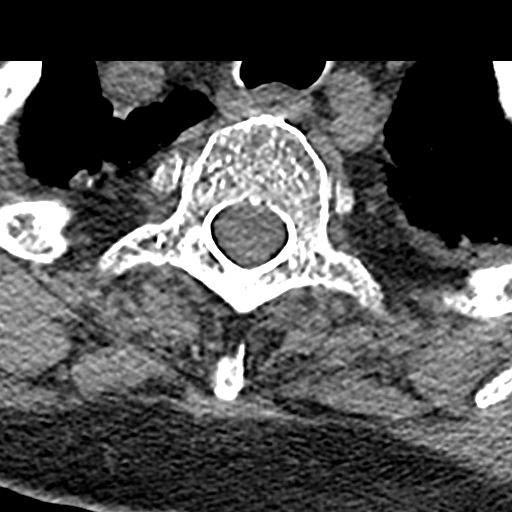
[im 16/92  bone]
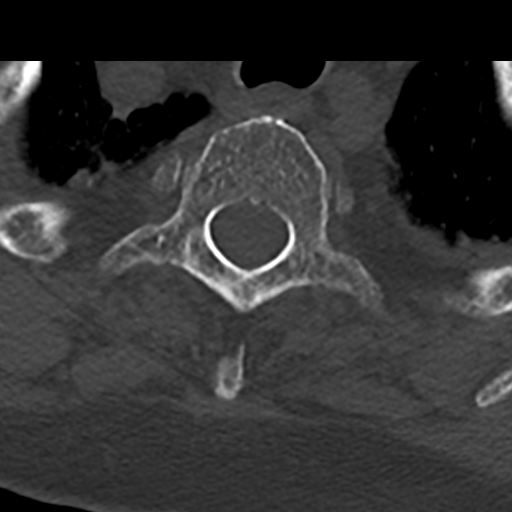
[im 31/92  bone]
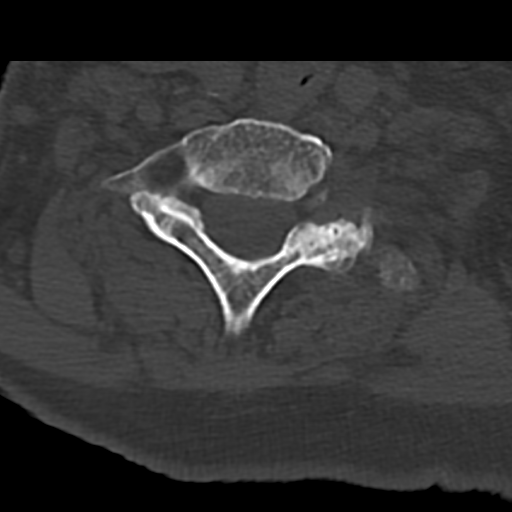
[im 46/92  bone]
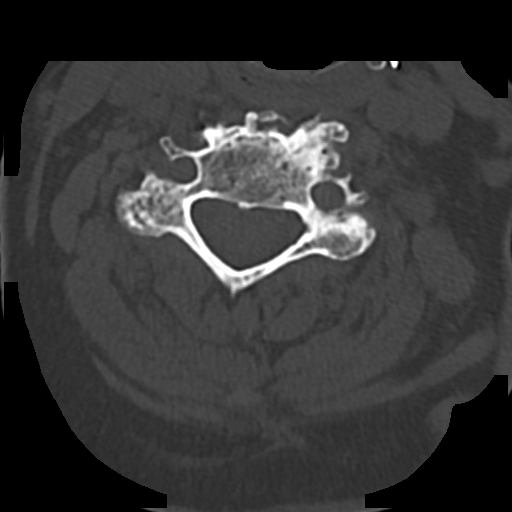
[im 61/92  bone]
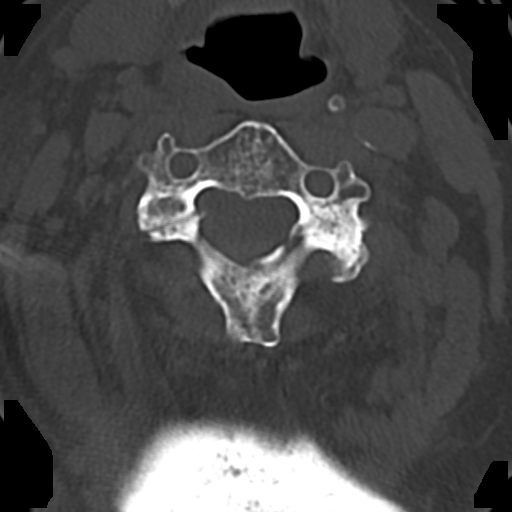
[im 76/92  soft-tissue]
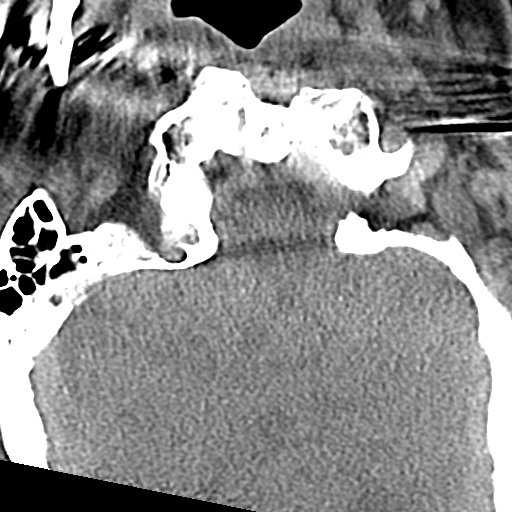
[im 76/92  bone]
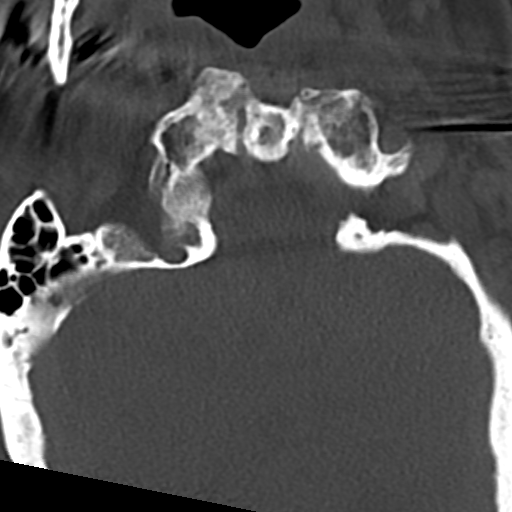

[15 of 33 positions shown; findings below may reference images not displayed]

FINDINGS: Alignment: There is straightened curvature of the cervical spine
with slight focal kyphosis centered at C5, similar to the prior
study. There is grade 1 anterolisthesis of C3 on C4 and C4 on C5,
also unchanged and likely degenerative in nature. There is no
evidence of traumatic malalignment. There is no jumped or perched
facet.

Skull base and vertebrae: Skull base alignment is maintained.
Vertebral body heights are preserved. There is no evidence of acute
fracture.

Soft tissues and spinal canal: No prevertebral fluid or swelling. No
visible canal hematoma.

Disc levels: There is marked intervertebral disc space narrowing at
C5-C6, similar to the prior study. Degenerative endplate changes
also most advanced at this level. There is multilevel facet
arthropathy, similar to the prior study. There is mild narrowing of
the craniocervical junction. The osseous spinal canal is otherwise
patent.

Upper chest: There is mosaic attenuation in the lung apices with
mild smooth interlobular septal thickening.

Other: The left thyroid lobe is surgically absent. The soft tissues
are otherwise unremarkable.
IMPRESSION: 1. No acute fracture or traumatic malalignment of the cervical
spine.
2. Multilevel degenerative changes as above, similar to the prior
study.
3. Smooth interlobular septal thickening in the lung apices can be
seen with pulmonary interstitial edema. Mosaic attenuation in the
lung apices suggests air trapping/small airway disease.

## 2020-11-24 IMAGING — CT CT HEAD W/O CM
4 series · 16 of 47 positions shown, 18 images · non-contrast
Comparison: CT head [DATE]

CLINICAL DATA: Altered mental status, fall

EXAM:
CT HEAD WITHOUT CONTRAST
TECHNIQUE: Contiguous axial images were obtained from the base of the skull
through the vertex without intravenous contrast.

[Series 3: head wo · axial · 0.44mm/px · z∈[+1302,+1422]mm · 7 of 32 slices shown, 9 images]
[im 4/32  brain]
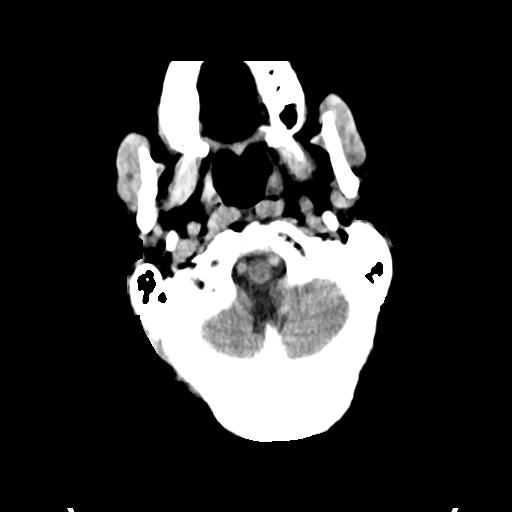
[im 4/32  bone]
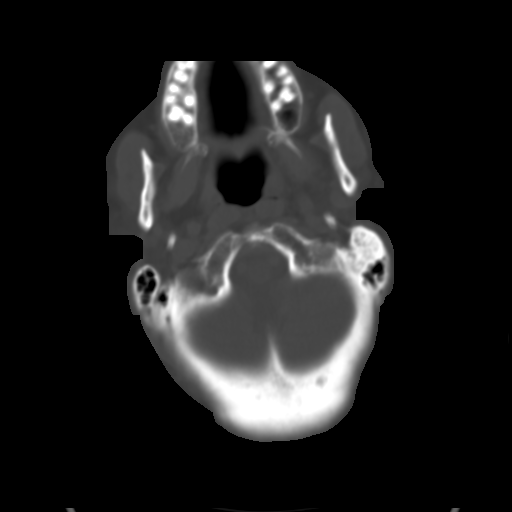
[im 8/32  brain]
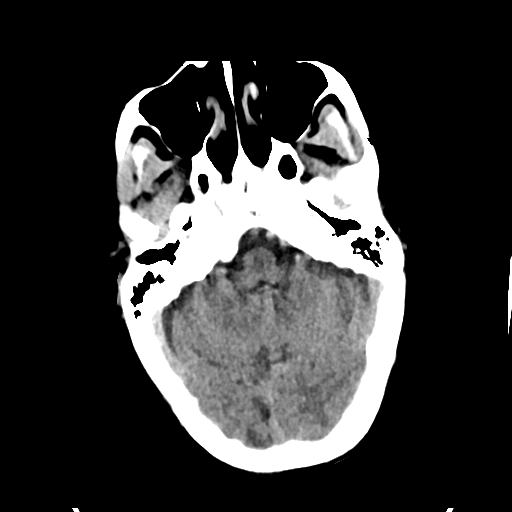
[im 12/32  brain]
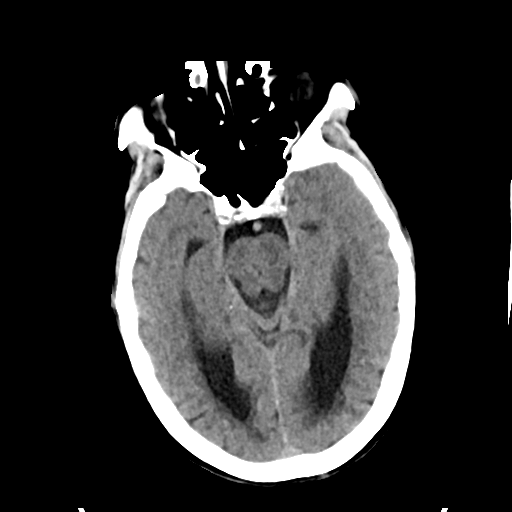
[im 16/32  brain]
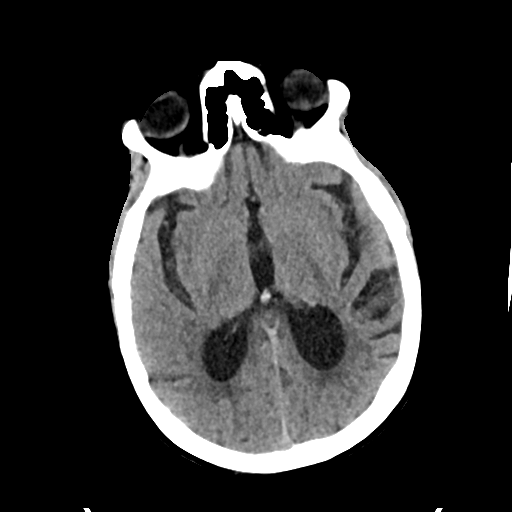
[im 20/32  brain]
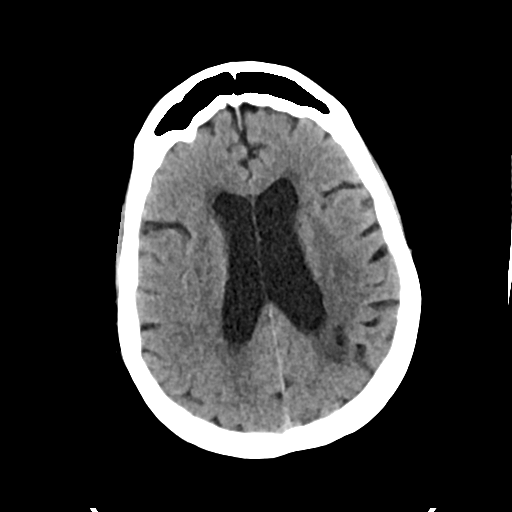
[im 20/32  bone]
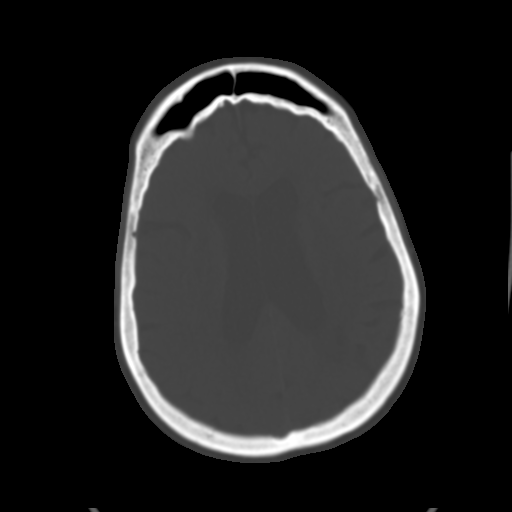
[im 24/32  brain]
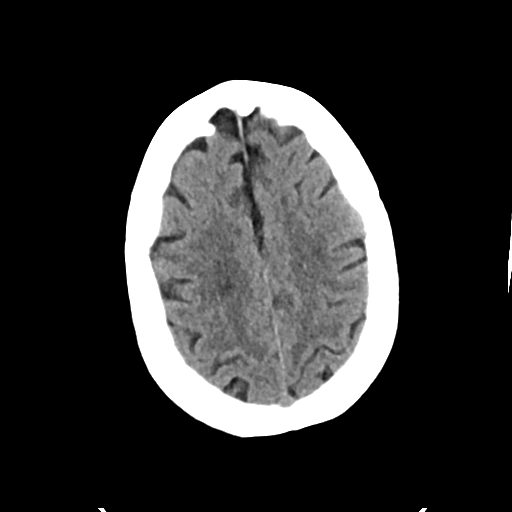
[im 28/32  brain]
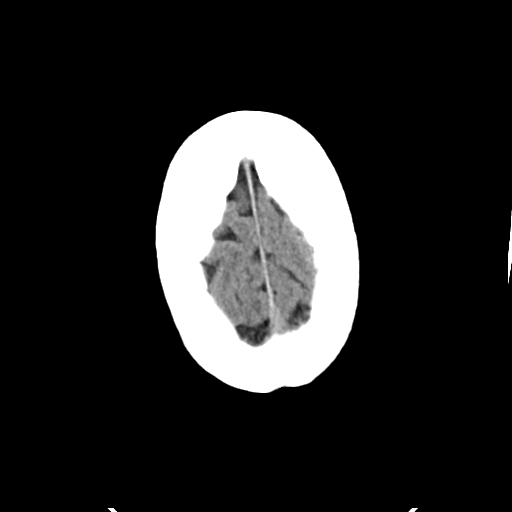

[Series 4: head bone · axial · 0.44mm/px · z∈[+1301,+1333]mm · 3 of 80 slices shown]
[im 8/80  bone]
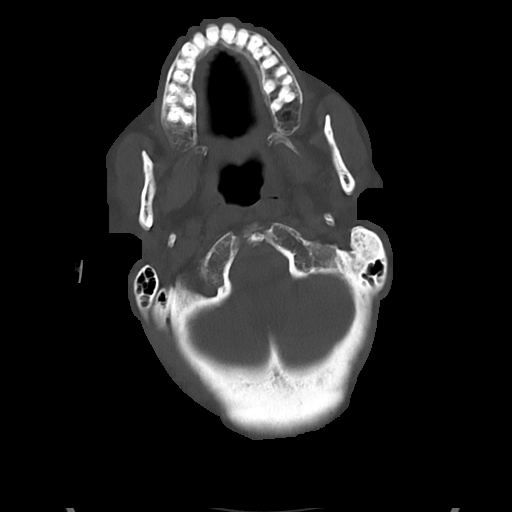
[im 16/80  bone]
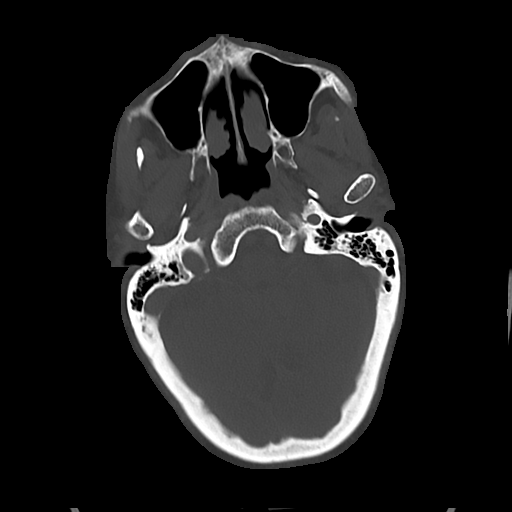
[im 24/80  bone]
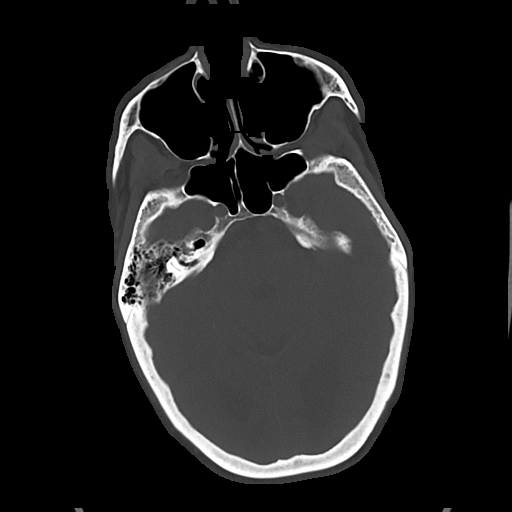

[Series 5: cor soft · coronal · 0.32mm/px · 3 of 70 slices shown]
[im 24/70  brain]
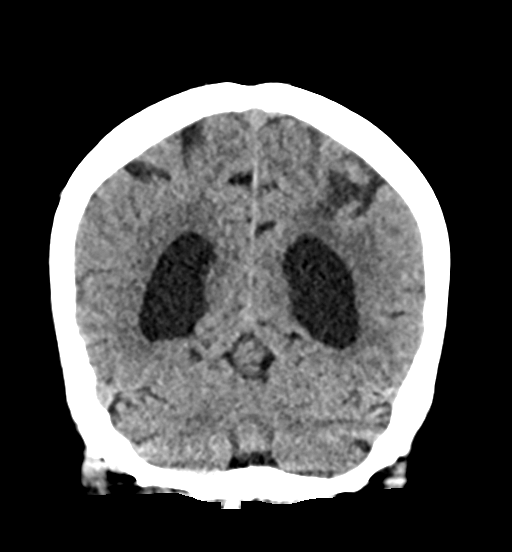
[im 31/70  brain]
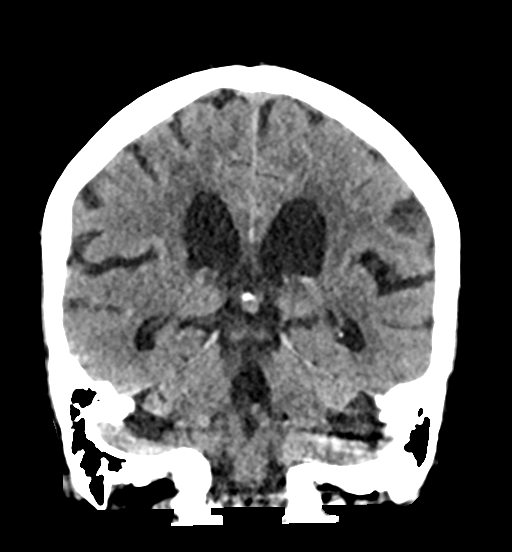
[im 39/70  brain]
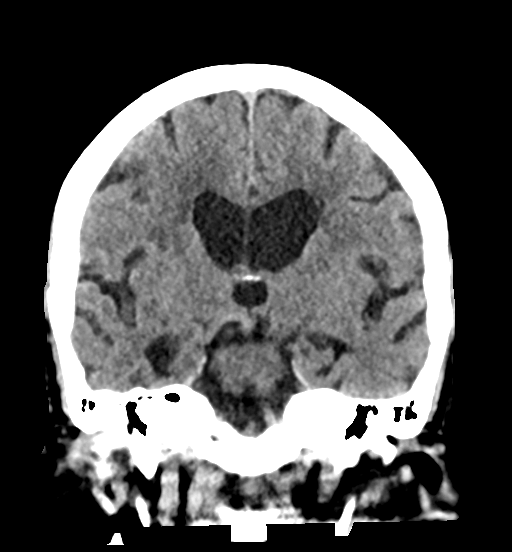

[Series 6: sag soft · sagittal · 0.35mm/px · 3 of 51 slices shown]
[im 17/51  brain]
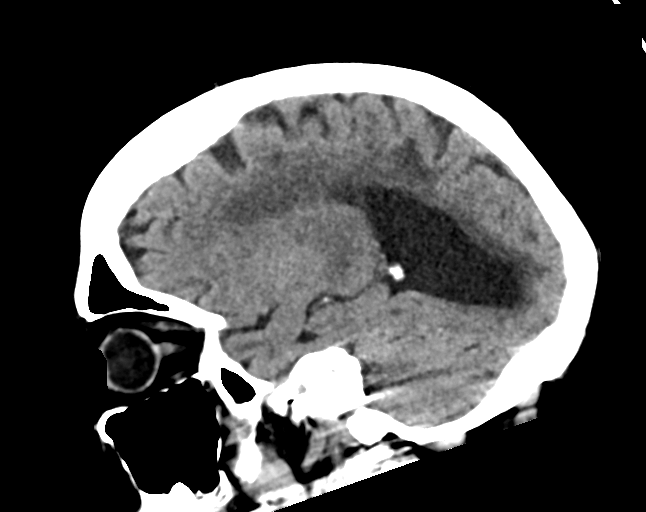
[im 26/51  brain]
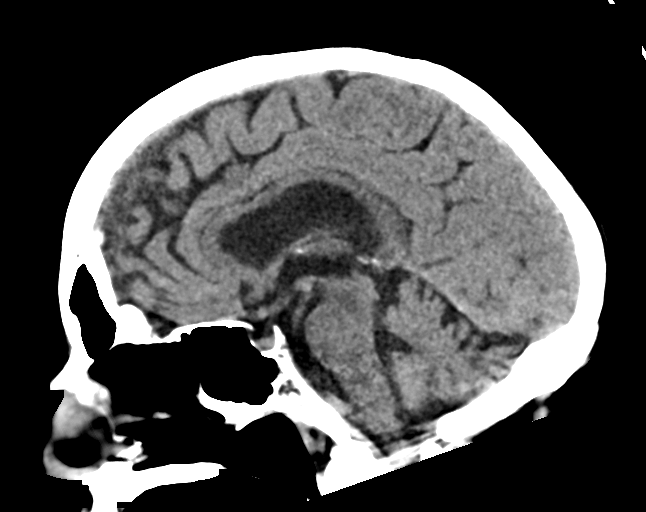
[im 34/51  brain]
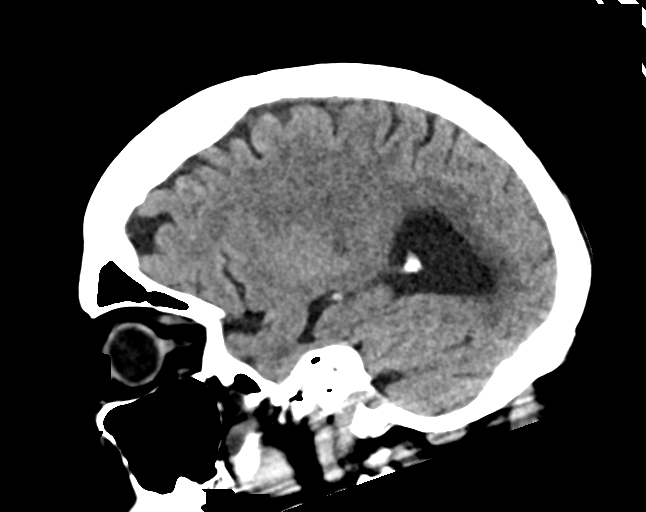

[16 of 47 positions shown; findings below may reference images not displayed]

FINDINGS: Brain: There is no evidence of acute intracranial hemorrhage,
extra-axial fluid collection, or acute infarct.

A remote infarct in the left parietal lobe is unchanged. Mild
parenchymal volume loss and chronic white matter microangiopathy are
unchanged. The ventricles are stable in size. There is no mass
lesion. There is no midline shift.

Vascular: There is calcification of the bilateral cavernous ICAs.

Skull: Normal. Negative for fracture or focal lesion.

Sinuses/Orbits: The paranasal sinuses are clear. The globes and
orbits are unremarkable.

Other: None.
IMPRESSION: 1. No acute intracranial pathology.
2. Unchanged remote left parietal lobe infarct, parenchymal volume
loss, and chronic white matter microangiopathy.

## 2020-11-24 MED ORDER — TRAMADOL HCL 50 MG PO TABS: 50.0000 mg | ORAL_TABLET | Freq: Three times a day (TID) | ORAL | Status: DC

## 2020-11-24 MED ORDER — LORAZEPAM 2 MG/ML IJ SOLN: 1.0000 mg | INTRAMUSCULAR | Status: DC | PRN

## 2020-11-24 MED ORDER — ASPIRIN 81 MG PO CHEW: 81.0000 mg | CHEWABLE_TABLET | Freq: Every morning | ORAL | Status: DC

## 2020-11-24 MED ORDER — POTASSIUM CHLORIDE 10 MEQ/100ML IV SOLN
10.0000 meq | Freq: Once | INTRAVENOUS | Status: AC
  Administered 2020-11-24: 10 meq via INTRAVENOUS
  Filled 2020-11-24: qty 100

## 2020-11-24 MED ORDER — ORAL CARE MOUTH RINSE
15.0000 mL | OROMUCOSAL | Status: DC
  Administered 2020-11-25 – 2020-11-28 (×21): 15 mL via OROMUCOSAL

## 2020-11-24 MED ORDER — ACETAMINOPHEN 325 MG PO TABS
650.0000 mg | ORAL_TABLET | Freq: Three times a day (TID) | ORAL | Status: DC
  Administered 2020-11-25 – 2020-11-27 (×7): 650 mg via ORAL
  Filled 2020-11-24 (×7): qty 2

## 2020-11-24 MED ORDER — ALLOPURINOL 100 MG PO TABS
100.0000 mg | ORAL_TABLET | Freq: Every morning | ORAL | Status: DC
  Administered 2020-11-26 – 2020-11-27 (×2): 100 mg via ORAL
  Filled 2020-11-24 (×2): qty 1

## 2020-11-24 MED ORDER — SODIUM CHLORIDE 0.9 % IV SOLN: INTRAVENOUS | Status: DC

## 2020-11-24 MED ORDER — MEMANTINE HCL 10 MG PO TABS
10.0000 mg | ORAL_TABLET | Freq: Two times a day (BID) | ORAL | Status: DC
  Administered 2020-11-25 – 2020-11-27 (×5): 10 mg via ORAL
  Filled 2020-11-24 (×8): qty 1

## 2020-11-24 MED ORDER — POTASSIUM CHLORIDE 10 MEQ/100ML IV SOLN
10.0000 meq | INTRAVENOUS | Status: AC
  Administered 2020-11-24 (×4): 10 meq via INTRAVENOUS
  Filled 2020-11-24 (×4): qty 100

## 2020-11-24 MED ORDER — THIAMINE HCL 100 MG/ML IJ SOLN
100.0000 mg | Freq: Once | INTRAMUSCULAR | Status: AC
  Administered 2020-11-24: 100 mg via INTRAVENOUS
  Filled 2020-11-24: qty 2

## 2020-11-24 MED ORDER — ENOXAPARIN SODIUM 40 MG/0.4ML IJ SOSY
40.0000 mg | PREFILLED_SYRINGE | INTRAMUSCULAR | Status: DC
  Administered 2020-11-25 – 2020-11-27 (×4): 40 mg via SUBCUTANEOUS
  Filled 2020-11-24 (×4): qty 0.4

## 2020-11-24 MED ORDER — LORAZEPAM 2 MG/ML IJ SOLN
1.0000 mg | Freq: Once | INTRAMUSCULAR | Status: AC
  Administered 2020-11-24: 1 mg via INTRAVENOUS
  Filled 2020-11-24: qty 1

## 2020-11-24 MED ORDER — LEVOTHYROXINE SODIUM 50 MCG PO TABS
50.0000 ug | ORAL_TABLET | ORAL | Status: DC
  Administered 2020-11-26 – 2020-11-27 (×2): 50 ug via ORAL
  Filled 2020-11-24 (×2): qty 1

## 2020-11-24 MED ORDER — LEVOTHYROXINE SODIUM 100 MCG PO TABS: 100.0000 ug | ORAL_TABLET | ORAL | Status: DC

## 2020-11-24 MED ORDER — CHLORHEXIDINE GLUCONATE 0.12% ORAL RINSE (MEDLINE KIT)
15.0000 mL | Freq: Two times a day (BID) | OROMUCOSAL | Status: DC
  Administered 2020-11-25 – 2020-11-27 (×3): 15 mL via OROMUCOSAL

## 2020-11-24 NOTE — ED Notes (Signed)
Resent light green lab tube for Magnesium.

## 2020-11-24 NOTE — ED Notes (Addendum)
Received pt, opens eyes with voice and mild sternal rub. Pt nodded when asked if she's called. VSS, NAD noted. Purewick connected to suction.

## 2020-11-24 NOTE — Progress Notes (Signed)
Orthopedic Tech Progress Note Patient Details:  [name] [birth year] [id number] Level 2 Trauma. Not needed Patient ID: [name], female   DOB: March 20, 1924, 87 y.o.   MRN: [id number]  [name] 11/24/2020, 3:31 PM

## 2020-11-24 NOTE — Progress Notes (Signed)
   11/24/20 1300  Clinical Encounter Type  Visited With Patient  Visit Type Initial  Referral From Nurse  Consult/Referral To Chaplain  Spiritual Encounters  Spiritual Needs Prayer;Emotional  Stress Factors  Patient Stress Factors None identified  Family Stress Factors None identified  Chaplain met with patient at bedside in ED # 19 this afternoon upon her arrival and assessment.  Patient was in emotional distress with confusion. Heard words of Chaplain but did not respond.  Confusion and emotional distress.  Provided verbal spiritual support and a non anxious presence for the patient.  Ended visit with a departing blessing.  Will remain available for additional spiritual support throughout day.    Respectfully Submitted,   Rev. Mammie Lorenzo

## 2020-11-24 NOTE — Progress Notes (Signed)
EEG complete - results pending 

## 2020-11-24 NOTE — ED Notes (Signed)
Patient transported to CT 

## 2020-11-24 NOTE — ED Provider Notes (Signed)
[facility] EMERGENCY DEPARTMENT Provider Note   CSN: [id number] Arrival date & time: 11/24/20  1315     History Chief complaint: [name] is a 87 y.o. female.  HPI  Presented to the ED for evaluation of a fall and possible seizure.  Patient is a resident of a nursing facility.  She was sitting down fell backwards hitting her head.  And started having seizure.  EMS was contacted.  According to the MS report the patient was postictal initially.  She was only responding to painful stimuli.  Patient does not have a prior history of seizures.  She does have history of dementia with behavioral disturbance.  In the ED the patient primarily is yelling that we are hurting her.  She is unable to tell me what happened  History reviewed. No pertinent past medical history.  There are no problems to display for this patient.   History reviewed. No pertinent surgical history.   OB History   No obstetric history on file.     History reviewed. No pertinent family history.     Home Medications Prior to Admission medications   Not on File    Allergies    Patient has no allergy information on record.  Review of Systems   Review of Systems  Unable to perform ROS: Dementia  All other systems reviewed and are negative.  Physical Exam Updated Vital Signs BP (!) 144/72   Pulse 80   Temp 98.6 F (37 C)   Resp 16   SpO2 96%   Physical Exam Vitals and nursing note reviewed.  Constitutional:      Appearance: She is well-developed. She is not toxic-appearing or diaphoretic.  HENT:     Head: Normocephalic and atraumatic.     Right Ear: External ear normal.     Left Ear: External ear normal.     Mouth/Throat:     Comments: Dried blood noted around the mouth, no large tongue laceration noted Eyes:     General: No scleral icterus.       Right eye: No discharge.        Left eye: No discharge.     Conjunctiva/sclera: Conjunctivae normal.  Neck:      Trachea: No tracheal deviation.  Cardiovascular:     Rate and Rhythm: Normal rate and regular rhythm.  Pulmonary:     Effort: Pulmonary effort is normal. No respiratory distress.     Breath sounds: Normal breath sounds. No stridor. No wheezing or rales.  Abdominal:     General: Bowel sounds are normal. There is no distension.     Palpations: Abdomen is soft.     Tenderness: There is no abdominal tenderness. There is no guarding or rebound.  Musculoskeletal:        General: No tenderness or deformity.     Cervical back: Neck supple.  Skin:    General: Skin is warm and dry.     Findings: No rash.  Neurological:     General: No focal deficit present.     Mental Status: She is disoriented.     Cranial Nerves: No cranial nerve deficit (no facial droop, , no slurred speech).     Sensory: No sensory deficit.     Motor: No abnormal muscle tone or seizure activity.     Comments: Patient is able to tell me her name.  She will lift both arms off the bed although not when directed to.  She does  move both lower extremities no obvious facial droop noted  Psychiatric:     Comments: Agitated, angry    ED Results / Procedures / Treatments   Labs (all labs ordered are listed, but only abnormal results are displayed) Labs Reviewed  COMPREHENSIVE METABOLIC PANEL - Abnormal; Notable for the following components:      Result Value   Potassium 3.0 (*)    Glucose, Bld 107 (*)    Creatinine, Ser 1.37 (*)    Total Protein 6.3 (*)    Albumin 3.4 (*)    GFR, Estimated 36 (*)    All other components within normal limits  CBC WITH DIFFERENTIAL/PLATELET - Abnormal; Notable for the following components:   WBC 12.7 (*)    MCV 101.7 (*)    Neutro Abs 10.4 (*)    All other components within normal limits  RESP PANEL BY RT-PCR (FLU A&B, COVID) [id number]  PROTIME-INR  URINALYSIS, ROUTINE W REFLEX MICROSCOPIC  CBG MONITORING, ED    EKG EKG Interpretation  Date/Time:  Saturday November 24 2020 13:20:43  EDT Ventricular Rate:  96 PR Interval:  133 QRS Duration: 90 QT Interval:  364 QTC Calculation: 460 R Axis:   43 Text Interpretation: Sinus rhythm Probable inferior infarct, old No old tracing to compare Confirmed by Dorie Rank [phone]) on 11/24/2020 1:26:12 PM  Radiology CT HEAD WO CONTRAST  Result Date: 11/24/2020 CLINICAL DATA:  Altered mental status, fall EXAM: CT HEAD WITHOUT CONTRAST TECHNIQUE: Contiguous axial images were obtained from the base of the skull through the vertex without intravenous contrast. COMPARISON:  CT head 08/02/2020 FINDINGS: Brain: There is no evidence of acute intracranial hemorrhage, extra-axial fluid collection, or acute infarct. A remote infarct in the left parietal lobe is unchanged. Mild parenchymal volume loss and chronic white matter microangiopathy are unchanged. The ventricles are stable in size. There is no mass lesion. There is no midline shift. Vascular: There is calcification of the bilateral cavernous ICAs. Skull: Normal. Negative for fracture or focal lesion. Sinuses/Orbits: The paranasal sinuses are clear. The globes and orbits are unremarkable. Other: None. IMPRESSION: 1. No acute intracranial pathology. 2. Unchanged remote left parietal lobe infarct, parenchymal volume loss, and chronic white matter microangiopathy. Electronically Signed   By: Valetta Mole M.D.   On: 11/24/2020 14:29   CT CERVICAL SPINE WO CONTRAST  Result Date: 11/24/2020 CLINICAL DATA:  Trauma, fall EXAM: CT CERVICAL SPINE WITHOUT CONTRAST TECHNIQUE: Multidetector CT imaging of the cervical spine was performed without intravenous contrast. Multiplanar CT image reconstructions were also generated. COMPARISON:  CT cervical spine 08/02/2020 FINDINGS: Alignment: There is straightened curvature of the cervical spine with slight focal kyphosis centered at C5, similar to the prior study. There is grade 1 anterolisthesis of C3 on C4 and C4 on C5, also unchanged and likely degenerative in  nature. There is no evidence of traumatic malalignment. There is no jumped or perched facet. Skull base and vertebrae: Skull base alignment is maintained. Vertebral body heights are preserved. There is no evidence of acute fracture. Soft tissues and spinal canal: No prevertebral fluid or swelling. No visible canal hematoma. Disc levels: There is marked intervertebral disc space narrowing at C5-C6, similar to the prior study. Degenerative endplate changes also most advanced at this level. There is multilevel facet arthropathy, similar to the prior study. There is mild narrowing of the craniocervical junction. The osseous spinal canal is otherwise patent. Upper chest: There is mosaic attenuation in the lung apices with mild smooth interlobular septal thickening. Other:  The left thyroid lobe is surgically absent. The soft tissues are otherwise unremarkable. IMPRESSION: 1. No acute fracture or traumatic malalignment of the cervical spine. 2. Multilevel degenerative changes as above, similar to the prior study. 3. Smooth interlobular septal thickening in the lung apices can be seen with pulmonary interstitial edema. Mosaic attenuation in the lung apices suggests air trapping/small airway disease. Electronically Signed   By: Valetta Mole M.D.   On: 11/24/2020 14:22   DG Chest Portable 1 View  Result Date: 11/24/2020 CLINICAL DATA:  Syncope. EXAM: PORTABLE CHEST 1 VIEW COMPARISON:  Jul 15, 2019 FINDINGS: Stable cardiomegaly. The hila and mediastinum are unremarkable. No pneumothorax. No overt edema or focal infiltrate. Nodular density projected over the right base is favored to represent confluence of shadows or atelectasis. No other acute abnormalities. IMPRESSION: A nodular density projected over the right base is favored to represent confluence of shadows or atelectasis. Recommend a PA and lateral chest x-Quinones before discharge. No acute abnormalities are seen. Electronically Signed   By: Dorise Bullion III M.D.   On:  11/24/2020 13:45    Procedures Procedures   Medications Ordered in ED Medications  0.9 %  sodium chloride infusion ( Intravenous New Bag/Given 11/24/20 1415)  potassium chloride 10 mEq in 100 mL IVPB (has no administration in time range)  thiamine (B-1) injection 100 mg (100 mg Intravenous Given 11/24/20 1358)  LORazepam (ATIVAN) injection 1 mg (1 mg Intravenous Given 11/24/20 1337)    ED Course  I have reviewed the triage vital signs and the nursing notes.  Pertinent labs & imaging results that were available during my care of the patient were reviewed by me and considered in my medical decision making (see chart for details).  Clinical Course as of 11/24/20 1458  Sat Nov 24, 2020  1449 CT without acute findings [JK]  1450 C-spine CT without acute findings [JK]  1450 Chest x-Peth finding noted.  Repeat PA and lateral recommended [JK]    Clinical Course User Index [JK] Dorie Rank, MD   MDM Rules/Calculators/A&P                           Patient presented to the ED for evaluation of a fall and possible seizure activity.  According to the EMS report the patient was at the nursing facility when she had a fall and staff witnessed possible seizure.  She did appear confused and postictal after the event.  Patient does not have any history of seizure disorder.  CT scan does not show any signs of acute hemorrhage.  No signs of traumatic spine injury.  Hypokalemia noted but unlikely to have precipitated this event.  Unclear if patient had a fall and a posttraumatic seizure versus possible syncopal episode and seizure-like activity.  I have ordered IV potassium.  I will consult the medical service for admission.  Consider EEG for further evaluation of Final Clinical Impression(s) / ED Diagnoses Final diagnoses:  Seizure ([place])  Hypokalemia  Fall, initial encounter     Dorie Rank, MD 11/24/20 1458

## 2020-11-24 NOTE — ED Triage Notes (Signed)
BIB by EMS for a fall at home. Pt felt dizzy and fell backwards hitting the back of her head and EMS was told she had a seizure. Pt was post ictal withEMS and only responded to painful stimuli. Pt able o move all extremities   BP 176/112 HR 108 CBG 114 96% RA

## 2020-11-24 NOTE — H&P (Signed)
History and Physical    SAMYA SICILIANO ZOX:[id number] DOB: [birth year] DOA: 11/24/2020  Referring MD/NP/PA: Dorie Rank, MD PCP: Virgie Dad, MD  Patient coming from: [facility] via EMS  Chief Complaint: Seizure-like activity  I have personally briefly reviewed patient's old medical records in [place]   HPI: FREDA JAQUITH is a 87 y.o. female with medical history significant of hypertension, hypothyroidism, CVA, dementia, chronic back pain, and gout who presents after having a seizure-like activity.  History is obtained from the patient's son over the phone as well as review of records as the patient is unable to give any history at this time.  Her son was told that she stood up to go somewhere with her walker and fallen.  The medical assistant helped her up and sat her in a chair and had gone to get the machine to check her vitals.  When they came back the patient was noted to be lethargic and was bleeding at the side of her mouth having bitten her tongue.  Per EMS report the patient had fallen backwards hitting her head and there were reports of her complaining of dizziness.  Then there were notes of patient having seizure activity and upon EMS arrival was noted to be postictal only responding to painful stimuli.  Her son also notes that the patient has a significant history of chronic back pain for which she has known compression fractures and is on tramadol 3 times daily.  ED Course: Upon admission into the emergency department patient was seen to be afebrile with blood pressure elevated up to 184/107, though the vital signs maintained.  Labs significant for WBC 12.7, potassium 3, BUN 12,  and creatinine 1.37.  CT of the head and cervical spine did not note any acute intercranial abnormality or fracture.  There was note of remote left parietal lobe infarct with parenchymal volume loss.  Chest x-Scronce noted nodular densities over the right base for which thought to be possible  shadows or atelectasis.  Review of Systems  Unable to perform ROS: Mental status change   Past Medical History:  Diagnosis Date   Cognitive changes     Dementia ([place])     Depression     Gout     Hypertension     Hypothyroidism     Peripheral neuropathy        History reviewed. No pertinent surgical history.   Reports that she has never smoked. She has never used smokeless tobacco. She reports that she does not drink alcohol and does not use drugs.  Social History         Socioeconomic History   Marital status: Widowed      Spouse name: Not on file   Number of children: Not on file   Years of education: Not on file   Highest education level: Not on file  Occupational History   Not on file  Tobacco Use   Smoking status: Never   Smokeless tobacco: Never  Substance and Sexual Activity   Alcohol use: Never   Drug use: Never   Sexual activity: Not on file  Other Topics Concern   Not on file  Social History Narrative   Not on file      History reviewed. No pertinent family history.   Current Outpatient Medications on File Prior to Encounter  Medication Sig Dispense Refill   allopurinol (ZYLOPRIM) 100 MG tablet Take 100 mg by mouth in the morning.  aspirin 81 MG chewable tablet Chew 81 mg by mouth in the morning.     benazepril (LOTENSIN) 10 MG tablet Take 10 mg by mouth in the morning.     Calcium Carb-Cholecalciferol 500-400 MG-UNIT TABS Take 2 tablets by mouth in the morning.     Cholecalciferol 50 MCG (2000 UT) TABS Take 2,000 Units by mouth in the morning.     Coenzyme Q10 (CO Q-10) 100 MG CAPS Take 200 mg by mouth in the morning.     meclizine (ANTIVERT) 12.5 MG tablet Take 12.5 mg by mouth 2 (two) times daily as needed for dizziness.     memantine (NAMENDA) 10 MG tablet Take 10 mg by mouth 2 (two) times daily.     Omega-3 Fatty Acids (OMEGA III EPA+DHA) 1000 MG CAPS Take 2,000 mg by mouth 2 (two) times daily.     SYNTHROID 100 MCG tablet Take 100 mcg by  mouth See admin instructions. Take 100 mcg by mouth in the morning before breakfast only on Wednesdays     SYNTHROID 50 MCG tablet Take 50 mcg by mouth See admin instructions. Take 50 mcg by mouth in the morning before breakfast on Sun/Mon/Tues/Thurs/Fri/Sat     traMADol (ULTRAM) 50 MG tablet Take 50 mg by mouth with breakfast, with lunch, and with evening meal.     TYLENOL 325 MG tablet Take 650 mg by mouth See admin instructions. Take 650 mg by mouth three times a day with meals and CANNOT EXCEED 3,000 MG/24 HOURS OF TYLENOL FROM ALL COMBINED SOURCES        Physical Exam:  Constitutional: Elderly female who is currently lethargic and not able to really follow commands Vitals:   11/24/20 1323 11/24/20 1356 11/24/20 1400 11/24/20 1415  BP: (!) 184/107  140/67 (!) 144/72  Pulse:   83 80  Resp:   12 16  Temp:  98.6 F (37 C)    SpO2:   94% 96%   Eyes: PERRL, lids and conjunctivae normal ENMT: Mucous membranes are dry. Posterior pharynx clear of any exudate or lesions. Patient has dried blood present down the left side of her mouth with some blood present on the side of the tongue. Neck: normal, supple, no masses, no thyromegaly Respiratory: clear to auscultation bilaterally, no wheezing, no crackles. Normal respiratory effort. No accessory muscle use.  Cardiovascular: Regular rate and rhythm, no murmurs / rubs / gallops. No extremity edema. 2+ pedal pulses. No carotid bruits.  Abdomen: no tenderness, no masses palpated. No hepatosplenomegaly. Bowel sounds positive.  Musculoskeletal: no clubbing / cyanosis. No joint deformity upper and lower extremities. Good ROM, no contractures. Normal muscle tone.  Skin: no rashes, lesions, ulcers. No induration Neurologic: CN 2-12 grossly intact. Sensation intact, DTR normal. Strength 5/5 in all 4.  Psychiatric: Normal judgment and insight. Alert and oriented x 3. Normal mood.     Labs on Admission: I have personally reviewed following labs and  imaging studies  CBC: Recent Labs  Lab 11/24/20 1322  WBC 12.7*  NEUTROABS 10.4*  HGB 13.8  HCT 42.3  MCV 101.7*  PLT 315   Basic Metabolic Panel: Recent Labs  Lab 11/24/20 1322  NA 141  K 3.0*  CL 103  CO2 25  GLUCOSE 107*  BUN 12  CREATININE 1.37*  CALCIUM 9.1   GFR: CrCl cannot be calculated (Unknown ideal weight.). Liver Function Tests: Recent Labs  Lab 11/24/20 1322  AST 22  ALT 11  ALKPHOS 87  BILITOT 0.7  PROT  6.3*  ALBUMIN 3.4*   No results for input(s): LIPASE, AMYLASE in the last 168 hours. No results for input(s): AMMONIA in the last 168 hours. Coagulation Profile: Recent Labs  Lab 11/24/20 1322  INR 1.0   Cardiac Enzymes: No results for input(s): CKTOTAL, CKMB, CKMBINDEX, TROPONINI in the last 168 hours. BNP (last 3 results) No results for input(s): PROBNP in the last 8760 hours. HbA1C: No results for input(s): HGBA1C in the last 72 hours. CBG: No results for input(s): GLUCAP in the last 168 hours. Lipid Profile: No results for input(s): CHOL, HDL, LDLCALC, TRIG, CHOLHDL, LDLDIRECT in the last 72 hours. Thyroid Function Tests: No results for input(s): TSH, T4TOTAL, FREET4, T3FREE, THYROIDAB in the last 72 hours. Anemia Panel: No results for input(s): VITAMINB12, FOLATE, FERRITIN, TIBC, IRON, RETICCTPCT in the last 72 hours. Urine analysis: No results found for: COLORURINE, APPEARANCEUR, LABSPEC, PHURINE, GLUCOSEU, HGBUR, BILIRUBINUR, KETONESUR, PROTEINUR, UROBILINOGEN, NITRITE, LEUKOCYTESUR Sepsis Labs: Recent Results (from the past 240 hour(s))  Resp Panel by RT-PCR (Flu A&B, Covid) Nasopharyngeal Swab     Status: None   Collection Time: 11/24/20  1:24 PM   Specimen: Nasopharyngeal Swab; Nasopharyngeal(NP) swabs in vial transport medium  Result Value Ref Range Status   SARS Coronavirus 2 by RT PCR NEGATIVE NEGATIVE Final    Comment: (NOTE) SARS-CoV-2 target nucleic acids are NOT DETECTED.  The SARS-CoV-2 RNA is generally detectable  in upper respiratory specimens during the acute phase of infection. The lowest concentration of SARS-CoV-2 viral copies this assay can detect is 138 copies/mL. A negative result does not preclude SARS-Cov-2 infection and should not be used as the sole basis for treatment or other patient management decisions. A negative result may occur with  improper specimen collection/handling, submission of specimen other than nasopharyngeal swab, presence of viral mutation(s) within the areas targeted by this assay, and inadequate number of viral copies(<138 copies/mL). A negative result must be combined with clinical observations, patient history, and epidemiological information. The expected result is Negative.  Fact Sheet for Patients:  EntrepreneurPulse.com.au  Fact Sheet for Healthcare Providers:  IncredibleEmployment.be  This test is no t yet approved or cleared by the [place] FDA and  has been authorized for detection and/or diagnosis of SARS-CoV-2 by FDA under an Emergency Use Authorization (EUA). This EUA will remain  in effect (meaning this test can be used) for the duration of the COVID-19 declaration under Section 564(b)(1) of the Act, 21 U.S.C.section 360bbb-3(b)(1), unless the authorization is terminated  or revoked sooner.       Influenza A by PCR NEGATIVE NEGATIVE Final   Influenza B by PCR NEGATIVE NEGATIVE Final    Comment: (NOTE) The Xpert Xpress SARS-CoV-2/FLU/RSV plus assay is intended as an aid in the diagnosis of influenza from Nasopharyngeal swab specimens and should not be used as a sole basis for treatment. Nasal washings and aspirates are unacceptable for Xpert Xpress SARS-CoV-2/FLU/RSV testing.  Fact Sheet for Patients: EntrepreneurPulse.com.au  Fact Sheet for Healthcare Providers: IncredibleEmployment.be  This test is not yet approved or cleared by the [place] FDA and has  been authorized for detection and/or diagnosis of SARS-CoV-2 by FDA under an Emergency Use Authorization (EUA). This EUA will remain in effect (meaning this test can be used) for the duration of the COVID-19 declaration under Section 564(b)(1) of the Act, 21 U.S.C. section 360bbb-3(b)(1), unless the authorization is terminated or revoked.  Performed at [facility], [place] [address], [place], [place] [address]  Radiological Exams on Admission: CT HEAD WO CONTRAST  Result Date: 11/24/2020 CLINICAL DATA:  Altered mental status, fall EXAM: CT HEAD WITHOUT CONTRAST TECHNIQUE: Contiguous axial images were obtained from the base of the skull through the vertex without intravenous contrast. COMPARISON:  CT head 08/02/2020 FINDINGS: Brain: There is no evidence of acute intracranial hemorrhage, extra-axial fluid collection, or acute infarct. A remote infarct in the left parietal lobe is unchanged. Mild parenchymal volume loss and chronic white matter microangiopathy are unchanged. The ventricles are stable in size. There is no mass lesion. There is no midline shift. Vascular: There is calcification of the bilateral cavernous ICAs. Skull: Normal. Negative for fracture or focal lesion. Sinuses/Orbits: The paranasal sinuses are clear. The globes and orbits are unremarkable. Other: None. IMPRESSION: 1. No acute intracranial pathology. 2. Unchanged remote left parietal lobe infarct, parenchymal volume loss, and chronic white matter microangiopathy. Electronically Signed   By: Valetta Mole M.D.   On: 11/24/2020 14:29   CT CERVICAL SPINE WO CONTRAST  Result Date: 11/24/2020 CLINICAL DATA:  Trauma, fall EXAM: CT CERVICAL SPINE WITHOUT CONTRAST TECHNIQUE: Multidetector CT imaging of the cervical spine was performed without intravenous contrast. Multiplanar CT image reconstructions were also generated. COMPARISON:  CT cervical spine 08/02/2020 FINDINGS: Alignment: There is straightened curvature of the  cervical spine with slight focal kyphosis centered at C5, similar to the prior study. There is grade 1 anterolisthesis of C3 on C4 and C4 on C5, also unchanged and likely degenerative in nature. There is no evidence of traumatic malalignment. There is no jumped or perched facet. Skull base and vertebrae: Skull base alignment is maintained. Vertebral body heights are preserved. There is no evidence of acute fracture. Soft tissues and spinal canal: No prevertebral fluid or swelling. No visible canal hematoma. Disc levels: There is marked intervertebral disc space narrowing at C5-C6, similar to the prior study. Degenerative endplate changes also most advanced at this level. There is multilevel facet arthropathy, similar to the prior study. There is mild narrowing of the craniocervical junction. The osseous spinal canal is otherwise patent. Upper chest: There is mosaic attenuation in the lung apices with mild smooth interlobular septal thickening. Other: The left thyroid lobe is surgically absent. The soft tissues are otherwise unremarkable. IMPRESSION: 1. No acute fracture or traumatic malalignment of the cervical spine. 2. Multilevel degenerative changes as above, similar to the prior study. 3. Smooth interlobular septal thickening in the lung apices can be seen with pulmonary interstitial edema. Mosaic attenuation in the lung apices suggests air trapping/small airway disease. Electronically Signed   By: Valetta Mole M.D.   On: 11/24/2020 14:22   DG Chest Portable 1 View  Result Date: 11/24/2020 CLINICAL DATA:  Syncope. EXAM: PORTABLE CHEST 1 VIEW COMPARISON:  Jul 15, 2019 FINDINGS: Stable cardiomegaly. The hila and mediastinum are unremarkable. No pneumothorax. No overt edema or focal infiltrate. Nodular density projected over the right base is favored to represent confluence of shadows or atelectasis. No other acute abnormalities. IMPRESSION: A nodular density projected over the right base is favored to represent  confluence of shadows or atelectasis. Recommend a PA and lateral chest x-Yeagle before discharge. No acute abnormalities are seen. Electronically Signed   By: Dorise Bullion III M.D.   On: 11/24/2020 13:45    EKG: Independently reviewed.  sinus rhythm 96 bpm   Assessment/Plan Fall and seizure-like activity: Acute.  Patient presents after falling out of the wheelchair and with subsequent seizure-like activity.  Patient was found to have signs  of tongue biting and noted to be acutely altered on physical exam.  CT scan noted no acute intercranial abnormality and prior left parietal lobe infarct.  Lethargy was thought to be secondary to postictal state.  Question possibility of seizure versus convulsive syncope versus other viral infection symptoms. -Admit to medical telemetry bed -Seizure precautions -Neurochecks -Follow-up EEG -Ativan IV as needed for seizure like activity -Neurology consulted, we will follow-up for any further recommendations  Leukocytosis: Acute.  WBC elevated at 12.7.  Patient otherwise noted to be afebrile with no other SIRS criteria met.  Leukocytosis could be reactive in nature or related to underlying infection. -Check urinalysis -Consider needed 2 view chest x-Ben once patient becomes more alert -Monitoring off antibiotics at this time.  Consider starting empiric antibiotics after checking blood cultures if patient were to spike a fever  Acute kidney injury superimposed chronic kidney disease stage IIIb: On admission creatinine 1.37 and BUN 12.  Patient baseline creatinine previously noted to be 0.9 last month.  Patient had been placed on normal saline IV fluids at 125 mL/h. -Check urinalysis -Check CK level given reports of seizure -Continue normal saline IV fluids at 75 mL/h overnight -Recheck kidney function in a.m.  Hypertensive urgency: Acute.  On admission patient blood pressures to be elevated at 184/107.  Home medications appear to include benazepril 10 mg twice  daily. -Initially held benazepril due to AKI -Hydralazine IV as needed  Dementia with behavioral disturbance -Set bed alarm on -Continue Namenda  History of CVA: CT scan of brain noted an old left parietal infarct. -Continue aspirin when able  Hypokalemia: Acute.  Potassium 3 on admission.  Patient had been given 10 mEq of potassium chloride IV. -Give potassium chloride 40 mEq IV -Continue to monitor and replace as needed  Hypothyroidism: Last TSH was 2.34 in 06/2020 -Check TSH -Continue levothyroxine  Abnormal chest x-Carras: Patient chest x-Markey noted abnormal shadowing versus atelectasis for which 2 view chest x-Freehling was recommended. -Consider need of a two-view chest x-Paddack if needed once patient is more alert  DNR present on admission  DVT prophylaxis: Lovenox Code Status: DNR Family Communication: Son updated over the phone Disposition Plan: Likely discharge back to skilled nursing facility Consults called: Neurology Admission status: Observation  Norval Morton MD [facility]   If 7PM-7AM, please contact night-coverage   11/24/2020, 3:12 PM

## 2020-11-24 NOTE — Procedures (Signed)
Routine EEG Report  [name] is a 87 y.o. female with a history of spell who is undergoing an EEG to evaluate for seizures.  Report: This EEG was acquired with electrodes placed according to the International 10-20 electrode system (including Fp1, Fp2, F3, F4, C3, C4, P3, P4, O1, O2, T3, T4, T5, T6, A1, A2, Fz, Cz, Pz). The following electrodes were missing or displaced: none.  There was no waking occipital dominant rhythm. The best background was 6-7 Hz. This activity is reactive to stimulation. Sleep was identified by K complexes and sleep spindles. There was no focal slowing. There were no definitive interictal epileptiform discharges. There were no electrographic seizures identified. Photic stimulation and hyperventilation were not performed.   Impression and clinical correlation: This EEG was obtained while asleep and is abnormal due to mild-to-moderate diffuse slowing. There were no electrographic seizures or definitive epileptiform abnormalities seen during this recording.  Su Monks, MD [facility] [phone]  If 7pm- 7am, please page neurology on call as listed in [facility].

## 2020-11-24 NOTE — ED Notes (Signed)
Pt has both eyes open god eye contact   she replies that she is in no pain

## 2020-11-25 ENCOUNTER — Observation Stay (HOSPITAL_COMMUNITY): Payer: Medicare Other

## 2020-11-25 DIAGNOSIS — N179 Acute kidney failure, unspecified: Secondary | ICD-10-CM | POA: Diagnosis present

## 2020-11-25 DIAGNOSIS — E876 Hypokalemia: Secondary | ICD-10-CM | POA: Diagnosis present

## 2020-11-25 DIAGNOSIS — R569 Unspecified convulsions: Secondary | ICD-10-CM

## 2020-11-25 DIAGNOSIS — R296 Repeated falls: Secondary | ICD-10-CM | POA: Diagnosis not present

## 2020-11-25 DIAGNOSIS — Z79899 Other long term (current) drug therapy: Secondary | ICD-10-CM | POA: Diagnosis not present

## 2020-11-25 DIAGNOSIS — R5381 Other malaise: Secondary | ICD-10-CM | POA: Diagnosis not present

## 2020-11-25 DIAGNOSIS — M549 Dorsalgia, unspecified: Secondary | ICD-10-CM | POA: Diagnosis present

## 2020-11-25 DIAGNOSIS — R2681 Unsteadiness on feet: Secondary | ICD-10-CM | POA: Diagnosis not present

## 2020-11-25 DIAGNOSIS — Z043 Encounter for examination and observation following other accident: Secondary | ICD-10-CM | POA: Diagnosis not present

## 2020-11-25 DIAGNOSIS — Z20822 Contact with and (suspected) exposure to covid-19: Secondary | ICD-10-CM | POA: Diagnosis present

## 2020-11-25 DIAGNOSIS — J9811 Atelectasis: Secondary | ICD-10-CM | POA: Diagnosis present

## 2020-11-25 DIAGNOSIS — F0150 Vascular dementia without behavioral disturbance: Secondary | ICD-10-CM | POA: Diagnosis not present

## 2020-11-25 DIAGNOSIS — N189 Chronic kidney disease, unspecified: Secondary | ICD-10-CM | POA: Diagnosis not present

## 2020-11-25 DIAGNOSIS — M4856XA Collapsed vertebra, not elsewhere classified, lumbar region, initial encounter for fracture: Secondary | ICD-10-CM | POA: Diagnosis present

## 2020-11-25 DIAGNOSIS — G629 Polyneuropathy, unspecified: Secondary | ICD-10-CM | POA: Diagnosis present

## 2020-11-25 DIAGNOSIS — N1832 Chronic kidney disease, stage 3b: Secondary | ICD-10-CM | POA: Diagnosis present

## 2020-11-25 DIAGNOSIS — R29898 Other symptoms and signs involving the musculoskeletal system: Secondary | ICD-10-CM | POA: Diagnosis not present

## 2020-11-25 DIAGNOSIS — N1831 Chronic kidney disease, stage 3a: Secondary | ICD-10-CM | POA: Diagnosis not present

## 2020-11-25 DIAGNOSIS — Z9181 History of falling: Secondary | ICD-10-CM | POA: Diagnosis not present

## 2020-11-25 DIAGNOSIS — I129 Hypertensive chronic kidney disease with stage 1 through stage 4 chronic kidney disease, or unspecified chronic kidney disease: Secondary | ICD-10-CM | POA: Diagnosis present

## 2020-11-25 DIAGNOSIS — Z882 Allergy status to sulfonamides status: Secondary | ICD-10-CM | POA: Diagnosis not present

## 2020-11-25 DIAGNOSIS — Z881 Allergy status to other antibiotic agents status: Secondary | ICD-10-CM | POA: Diagnosis not present

## 2020-11-25 DIAGNOSIS — F32A Depression, unspecified: Secondary | ICD-10-CM | POA: Diagnosis present

## 2020-11-25 DIAGNOSIS — Z888 Allergy status to other drugs, medicaments and biological substances status: Secondary | ICD-10-CM | POA: Diagnosis not present

## 2020-11-25 DIAGNOSIS — Z8673 Personal history of transient ischemic attack (TIA), and cerebral infarction without residual deficits: Secondary | ICD-10-CM | POA: Diagnosis not present

## 2020-11-25 DIAGNOSIS — R561 Post traumatic seizures: Secondary | ICD-10-CM | POA: Diagnosis present

## 2020-11-25 DIAGNOSIS — Z66 Do not resuscitate: Secondary | ICD-10-CM | POA: Diagnosis present

## 2020-11-25 DIAGNOSIS — F0151 Vascular dementia with behavioral disturbance: Secondary | ICD-10-CM | POA: Diagnosis present

## 2020-11-25 DIAGNOSIS — E8809 Other disorders of plasma-protein metabolism, not elsewhere classified: Secondary | ICD-10-CM | POA: Diagnosis present

## 2020-11-25 DIAGNOSIS — G8929 Other chronic pain: Secondary | ICD-10-CM | POA: Diagnosis present

## 2020-11-25 DIAGNOSIS — M109 Gout, unspecified: Secondary | ICD-10-CM | POA: Diagnosis present

## 2020-11-25 DIAGNOSIS — R4182 Altered mental status, unspecified: Secondary | ICD-10-CM | POA: Diagnosis not present

## 2020-11-25 DIAGNOSIS — Y92009 Unspecified place in unspecified non-institutional (private) residence as the place of occurrence of the external cause: Secondary | ICD-10-CM | POA: Diagnosis not present

## 2020-11-25 DIAGNOSIS — D72829 Elevated white blood cell count, unspecified: Secondary | ICD-10-CM | POA: Diagnosis present

## 2020-11-25 DIAGNOSIS — R220 Localized swelling, mass and lump, head: Secondary | ICD-10-CM | POA: Diagnosis not present

## 2020-11-25 DIAGNOSIS — W050XXA Fall from non-moving wheelchair, initial encounter: Secondary | ICD-10-CM | POA: Diagnosis present

## 2020-11-25 DIAGNOSIS — R9389 Abnormal findings on diagnostic imaging of other specified body structures: Secondary | ICD-10-CM | POA: Diagnosis not present

## 2020-11-25 DIAGNOSIS — M6281 Muscle weakness (generalized): Secondary | ICD-10-CM | POA: Diagnosis not present

## 2020-11-25 DIAGNOSIS — Z743 Need for continuous supervision: Secondary | ICD-10-CM | POA: Diagnosis not present

## 2020-11-25 DIAGNOSIS — I160 Hypertensive urgency: Secondary | ICD-10-CM | POA: Diagnosis present

## 2020-11-25 DIAGNOSIS — S0003XA Contusion of scalp, initial encounter: Secondary | ICD-10-CM | POA: Diagnosis not present

## 2020-11-25 DIAGNOSIS — R55 Syncope and collapse: Secondary | ICD-10-CM | POA: Diagnosis not present

## 2020-11-25 DIAGNOSIS — R531 Weakness: Secondary | ICD-10-CM | POA: Diagnosis not present

## 2020-11-25 DIAGNOSIS — E039 Hypothyroidism, unspecified: Secondary | ICD-10-CM | POA: Diagnosis present

## 2020-11-25 DIAGNOSIS — I10 Essential (primary) hypertension: Secondary | ICD-10-CM | POA: Diagnosis not present

## 2020-11-25 LAB — BASIC METABOLIC PANEL
Anion gap: 11 (ref 5–15)
BUN: 9 mg/dL (ref 8–23)
CO2: 24 mmol/L (ref 22–32)
Calcium: 9 mg/dL (ref 8.9–10.3)
Chloride: 106 mmol/L (ref 98–111)
Creatinine, Ser: 0.94 mg/dL (ref 0.44–1.00)
GFR, Estimated: 57 mL/min — ABNORMAL LOW (ref >60)
Glucose, Bld: 81 mg/dL (ref 70–99)
Potassium: 3.2 mmol/L — ABNORMAL LOW (ref 3.5–5.1)
Sodium: 141 mmol/L (ref 135–145)

## 2020-11-25 LAB — GLUCOSE, CAPILLARY: Glucose-Capillary: 60 mg/dL — ABNORMAL LOW (ref 70–99)

## 2020-11-25 IMAGING — MR MR HEAD W/O CM
12 of 13 series · 44 of 48 positions shown · non-contrast
Comparison: Head CT from yesterday

CLINICAL DATA: TIA

EXAM:
MRI HEAD WITHOUT CONTRAST
MRA HEAD WITHOUT CONTRAST
TECHNIQUE: Multiplanar, multi-echo pulse sequences of the brain and surrounding
structures were acquired without intravenous contrast. Angiographic
images of the Circle of Willis were acquired using MRA technique
without intravenous contrast.

[Series 5: DWI · axial · 3.0mm · 0.88mm/px · z∈[-100,+41]mm · 6 of 96 slices shown (1 of 4)]
[im 1/96]
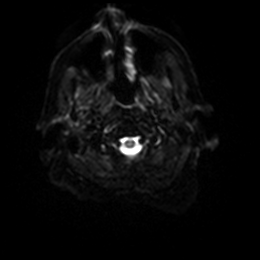
[im 20/96]
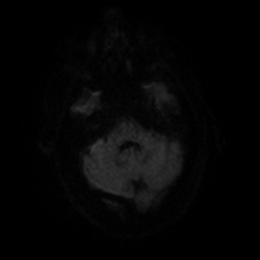
[im 39/96]
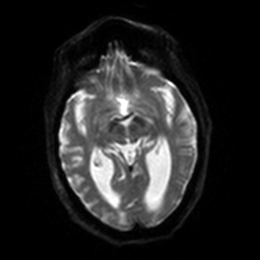
[im 58/96]
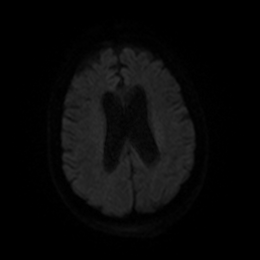
[im 77/96]
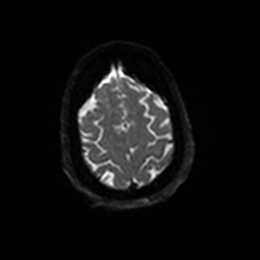
[im 96/96]
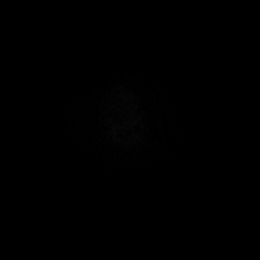

[Series 6: DWI · axial · 3.0mm · 0.88mm/px · z∈[-100,+41]mm · 4 of 48 slices shown (2 of 4)]
[im 1/48]
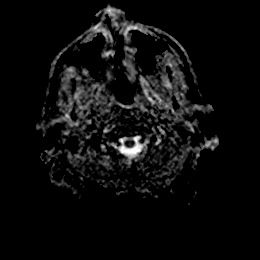
[im 16/48]
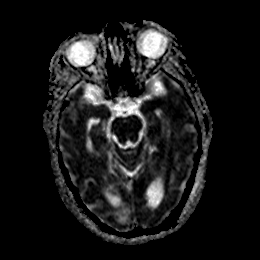
[im 32/48]
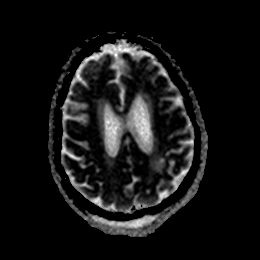
[im 48/48]
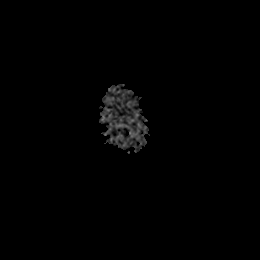

[Series 7: DWI · coronal · 4.0mm · 0.88mm/px · 5 of 72 slices shown (3 of 4)]
[im 1/72]
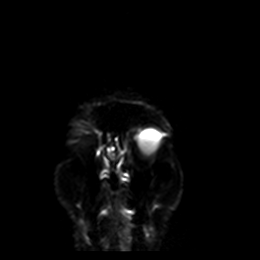
[im 18/72]
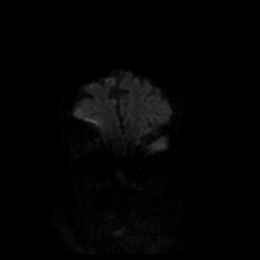
[im 36/72]
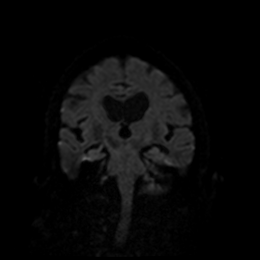
[im 54/72]
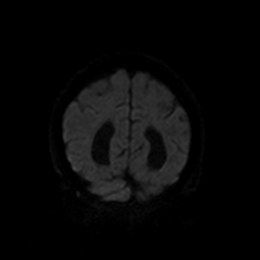
[im 72/72]
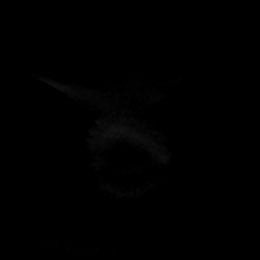

[Series 8: DWI · coronal · 4.0mm · 0.88mm/px · 3 of 36 slices shown (4 of 4)]
[im 1/36]
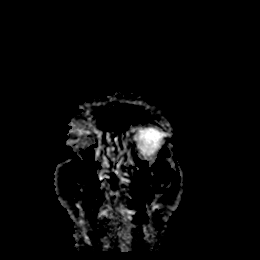
[im 18/36]
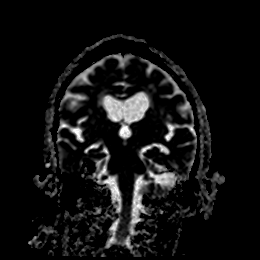
[im 36/36]
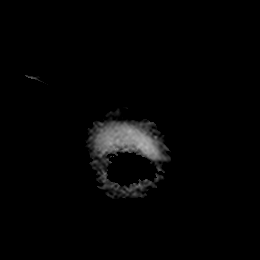

[Series 9: T2 · axial · 5.0mm · 0.72mm/px · z∈[-101,+42]mm · 2 of 25 slices shown (1 of 2)]
[im 1/25]
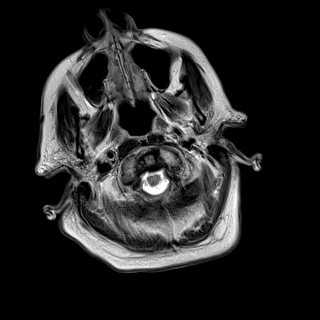
[im 25/25]
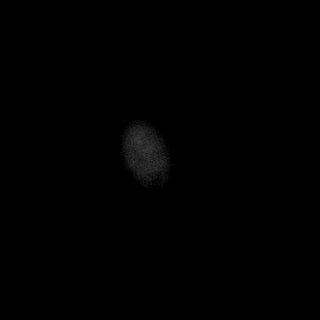

[Series 10: T1 · sagittal · 5.0mm · 0.90mm/px · 2 of 23 slices shown]
[im 1/23]
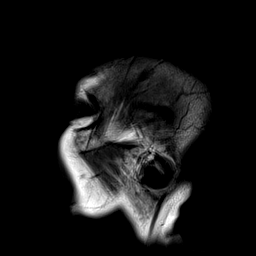
[im 23/23]
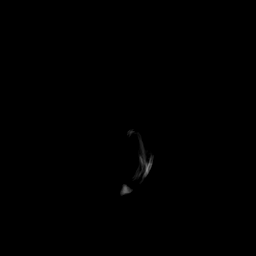

[Series 15: FLAIR · axial · 5.0mm · 0.90mm/px · z∈[-101,+42]mm · 2 of 25 slices shown]
[im 1/25]
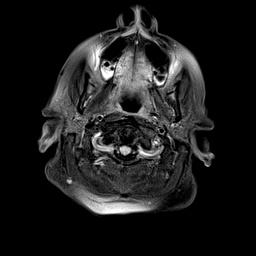
[im 25/25]
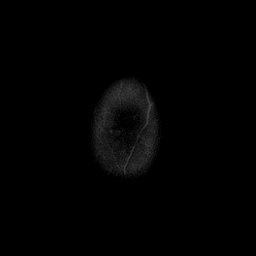

[Series 16: mag_images · axial · 3.0mm · 0.90mm/px · z∈[-129,+48]mm · 5 of 60 slices shown]
[im 1/60]
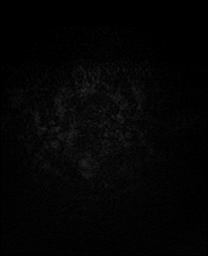
[im 15/60]
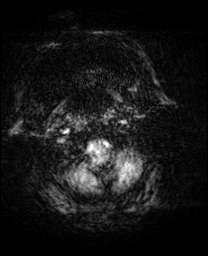
[im 30/60]
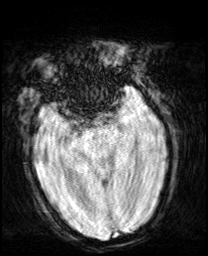
[im 45/60]
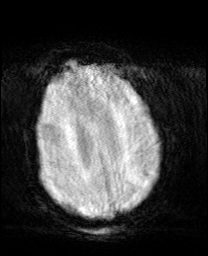
[im 60/60]
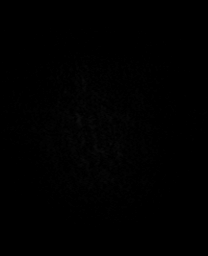

[Series 17: pha_images · axial · 3.0mm · 0.90mm/px · z∈[-123,+33]mm · 4 of 53 slices shown]
[im 1/53]
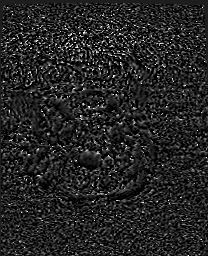
[im 18/53]
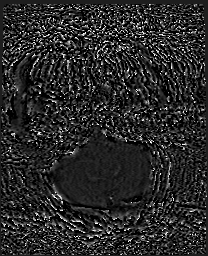
[im 35/53]
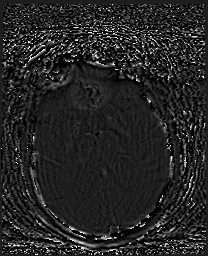
[im 53/53]
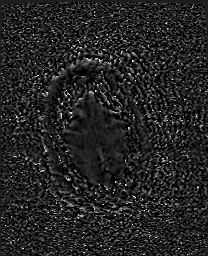

[Series 18: swi_images · axial · 3.0mm · 0.90mm/px · z∈[-129,+48]mm · 5 of 60 slices shown]
[im 1/60]
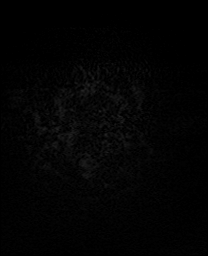
[im 15/60]
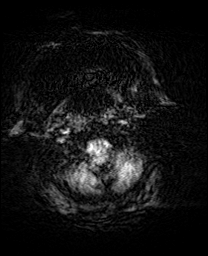
[im 30/60]
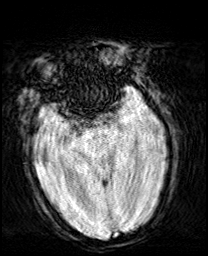
[im 45/60]
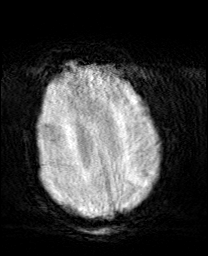
[im 60/60]
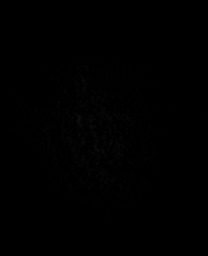

[Series 19: mip_images(sw) · axial · 24.0mm · 0.90mm/px · z∈[-118,+37]mm · 4 of 53 slices shown]
[im 1/53]
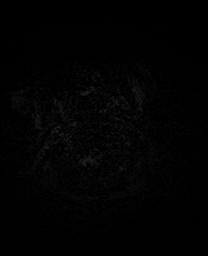
[im 18/53]
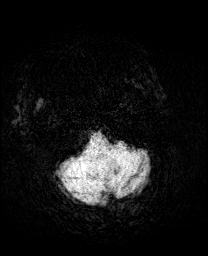
[im 35/53]
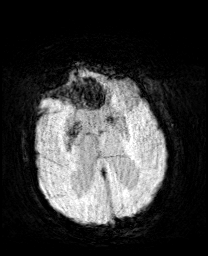
[im 53/53]
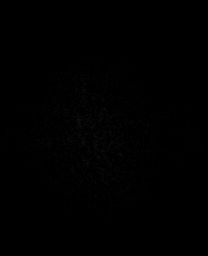

[Series 21: T2 · coronal · 5.0mm · 0.72mm/px · 2 of 28 slices shown (2 of 2)]
[im 1/28]
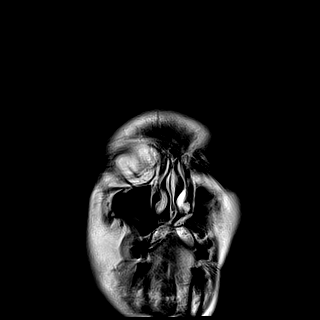
[im 28/28]
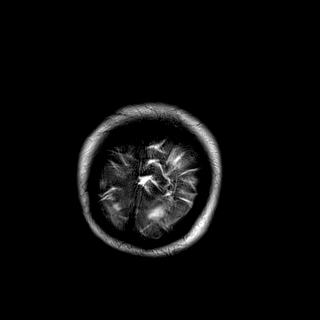

[44 of 48 positions shown; findings below may reference images not displayed]

FINDINGS: MRI HEAD FINDINGS

Brain: No acute infarction, hemorrhage, hydrocephalus, extra-axial
collection or mass lesion. Remote bilateral occipital and left
parietal cortically based infarcts. Chronic small vessel ischemia in
the deep white matter which is mild for age. Nonspecific cerebral
volume loss.

Vascular: Major flow voids are preserved

Skull and upper cervical spine: Posterior scalp swelling. No
evidence of bone lesion.

Sinuses/Orbits: Bilateral cataract resection.  No evidence of injury

Other: Moderate motion artifact to the degree that findings could be
obscured.

MRA HEAD FINDINGS

Significant motion artifact intermittently, especially at the level
of the circle-of-Willis. The covered vertebral, basilar, and carotid
arteries are patent. A1, M1, and P1 segments are largely obscured by
motion artifact. No detected branch occlusion or aneurysm.
IMPRESSION: Brain MRI:

1. No acute intracranial finding.  No acute infarct.
2. Chronic small vessel disease and small remote cortical infarcts.
3. Posterior scalp contusion.
4. Moderate motion artifact

Intracranial MRA:

Motion artifact with multiple nondiagnostic slices. Unremarkable
vessels where visible.

## 2020-11-25 IMAGING — MR MR MRA HEAD W/O CM
3 series · 19 of 48 positions shown · non-contrast
Comparison: Head CT from yesterday

CLINICAL DATA: TIA

EXAM:
MRI HEAD WITHOUT CONTRAST
MRA HEAD WITHOUT CONTRAST
TECHNIQUE: Multiplanar, multi-echo pulse sequences of the brain and surrounding
structures were acquired without intravenous contrast. Angiographic
images of the Circle of Willis were acquired using MRA technique
without intravenous contrast.

[Series 9: 3d cow · axial · 0.5mm · 0.41mm/px · z∈[-78,-1]mm · 11 of 172 slices shown (1 of 2)]
[im 9/172]
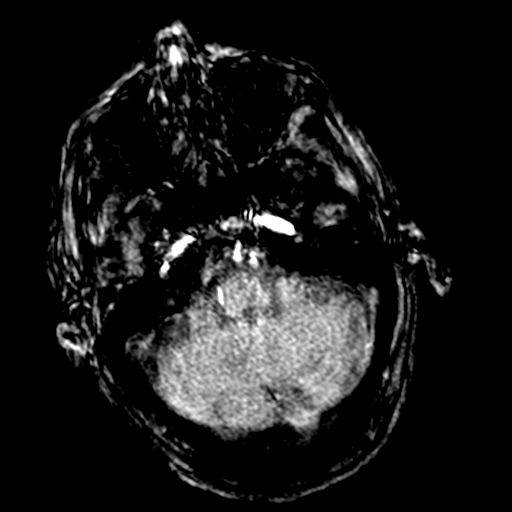
[im 25/172]
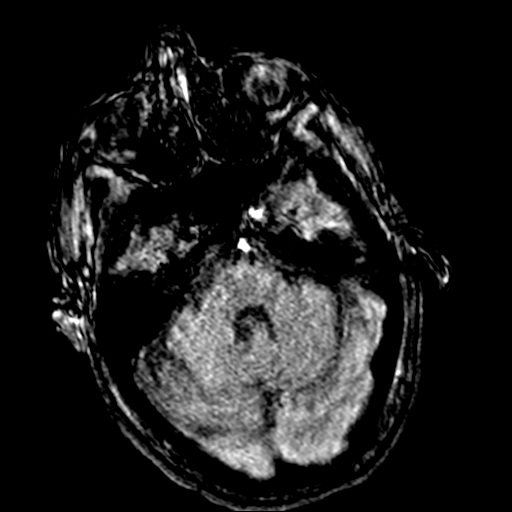
[im 33/172]
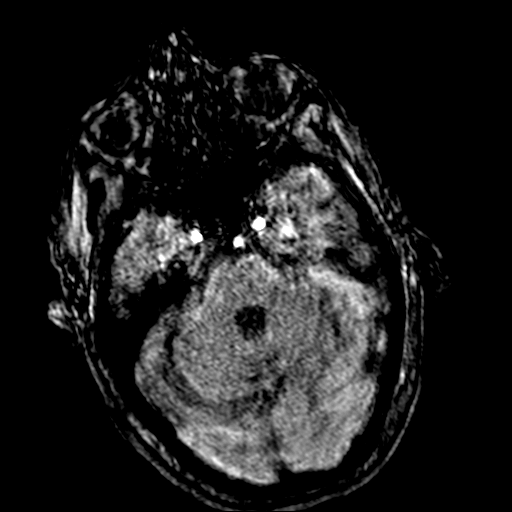
[im 49/172]
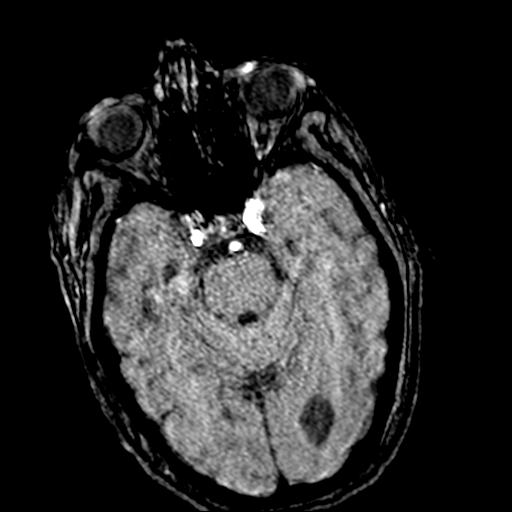
[im 74/172]
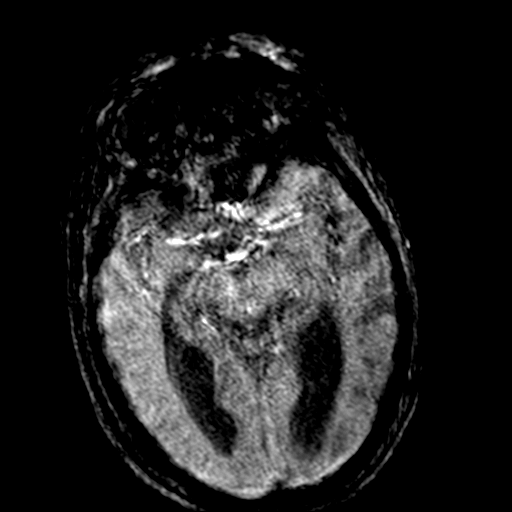
[im 90/172]
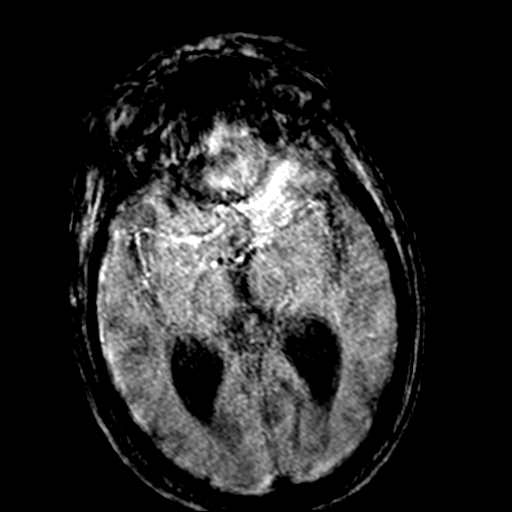
[im 98/172]
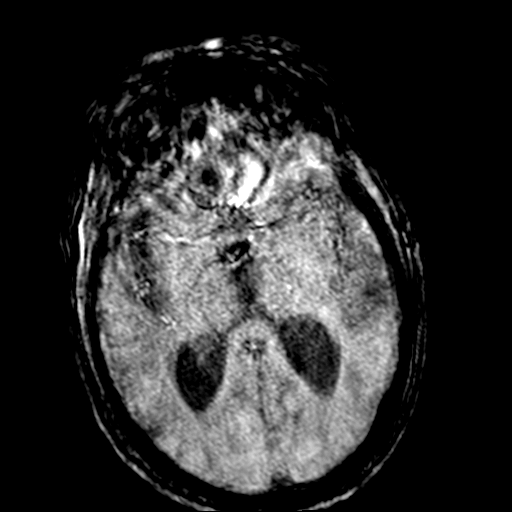
[im 123/172]
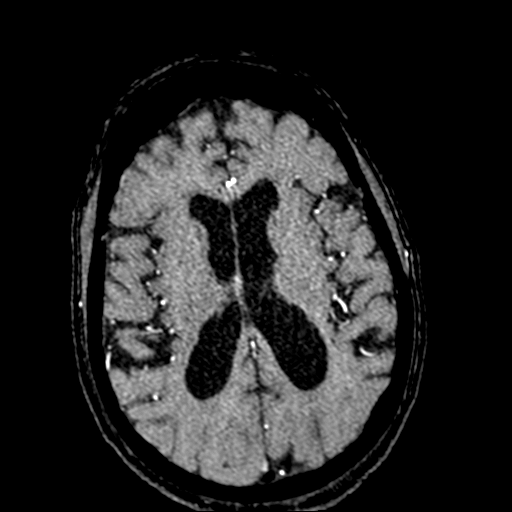
[im 139/172]
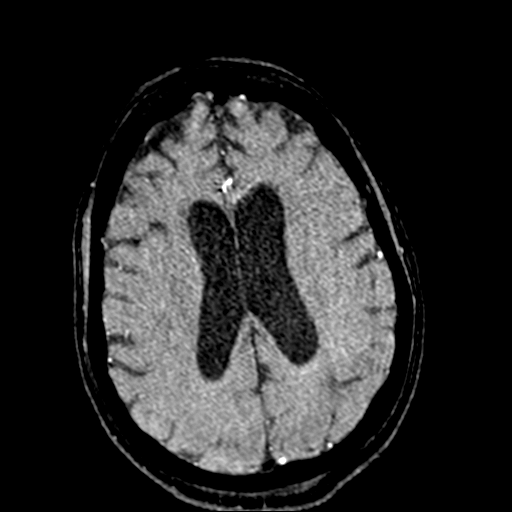
[im 147/172]
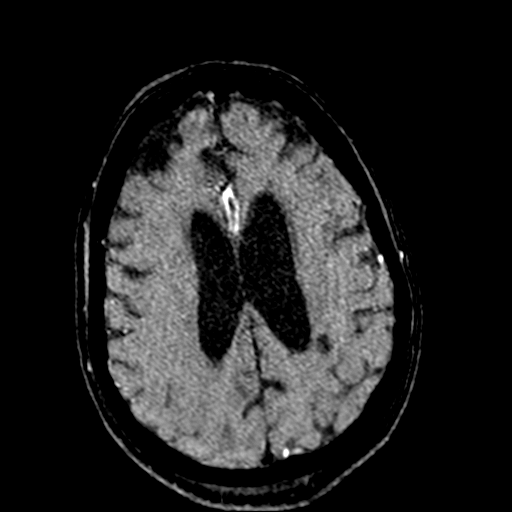
[im 163/172]
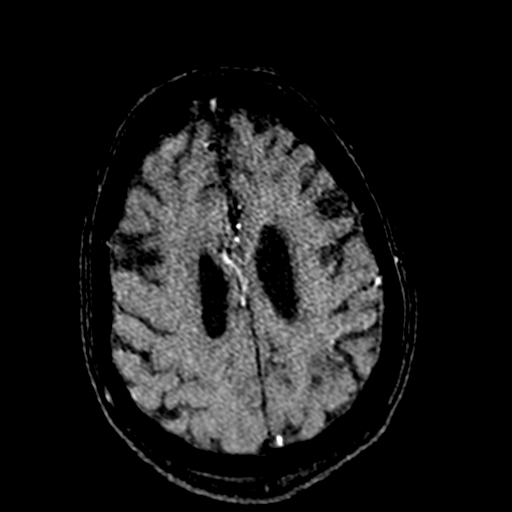

[Series 12: 3d cow_mip_tra · axial · 86.0mm · 0.41mm/px · 1 of 1 slices shown]
[im 1/1]
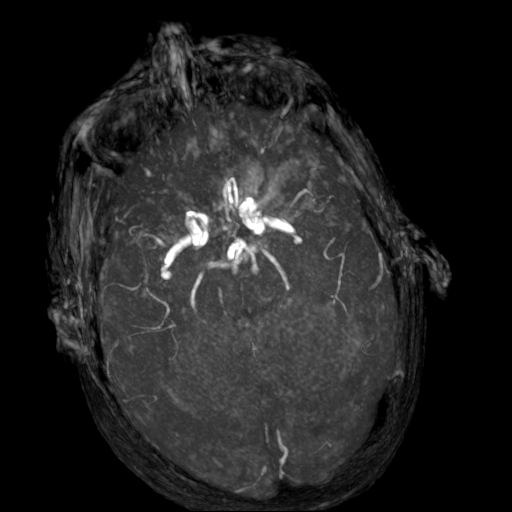

[Series 13: 3d cow · axial · 0.5mm · 0.41mm/px · z∈[-91,-15]mm · 7 of 192 slices shown (2 of 2)]
[im 8/192]
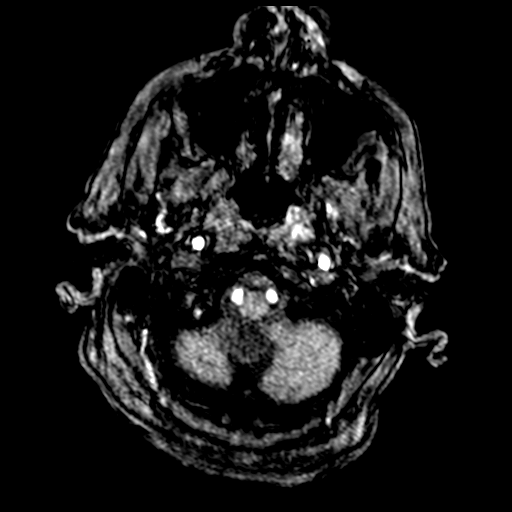
[im 32/192]
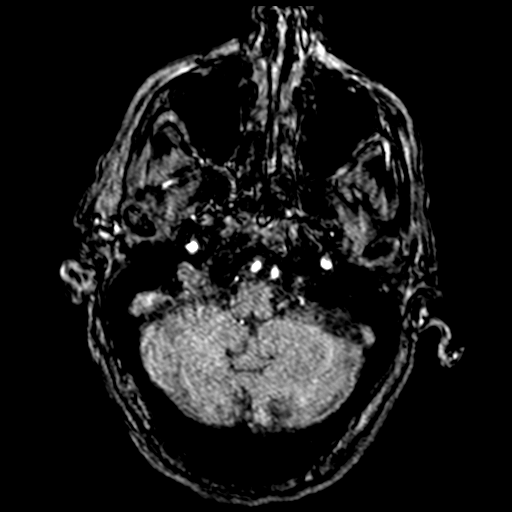
[im 56/192]
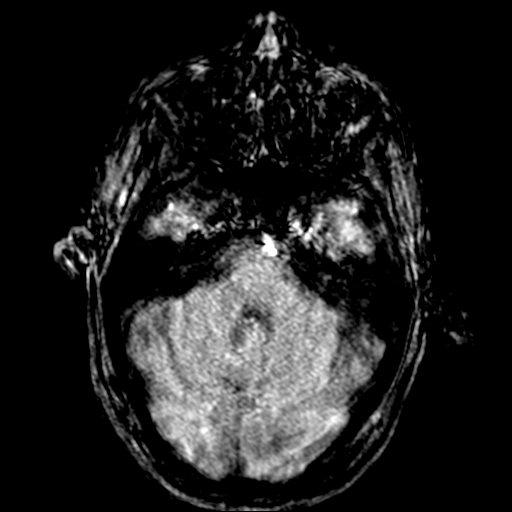
[im 80/192]
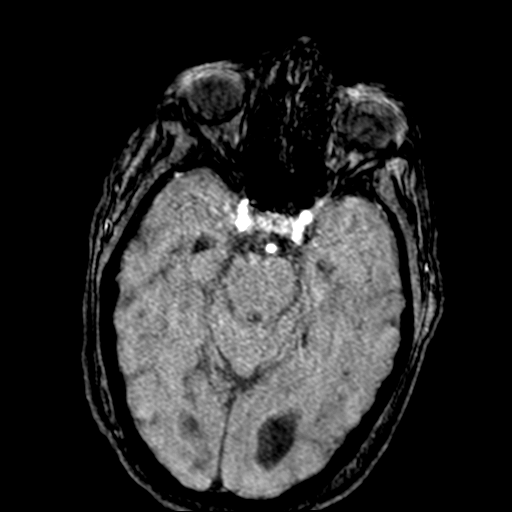
[im 96/192]
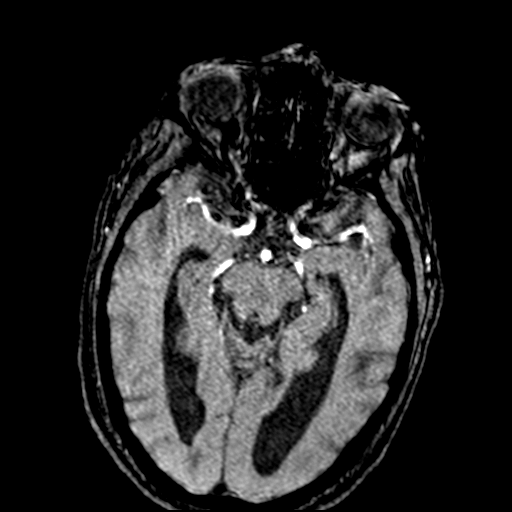
[im 112/192]
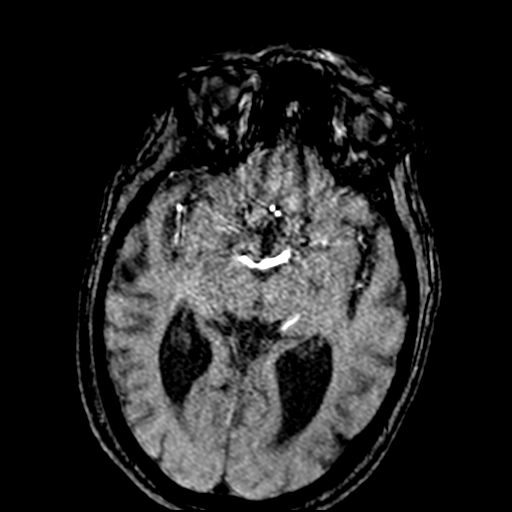
[im 160/192]
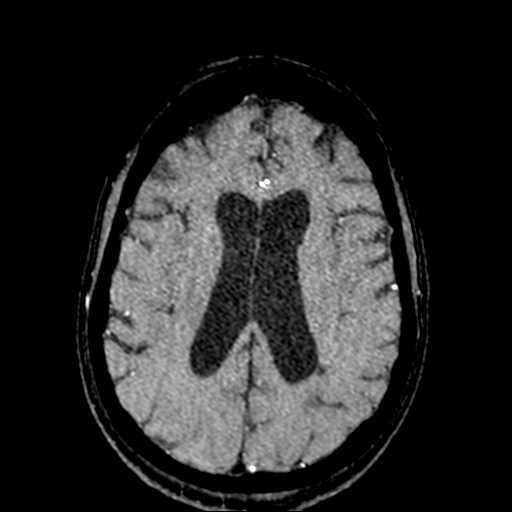

[19 of 48 positions shown; findings below may reference images not displayed]

FINDINGS: MRI HEAD FINDINGS

Brain: No acute infarction, hemorrhage, hydrocephalus, extra-axial
collection or mass lesion. Remote bilateral occipital and left
parietal cortically based infarcts. Chronic small vessel ischemia in
the deep white matter which is mild for age. Nonspecific cerebral
volume loss.

Vascular: Major flow voids are preserved

Skull and upper cervical spine: Posterior scalp swelling. No
evidence of bone lesion.

Sinuses/Orbits: Bilateral cataract resection.  No evidence of injury

Other: Moderate motion artifact to the degree that findings could be
obscured.

MRA HEAD FINDINGS

Significant motion artifact intermittently, especially at the level
of the circle-of-Willis. The covered vertebral, basilar, and carotid
arteries are patent. A1, M1, and P1 segments are largely obscured by
motion artifact. No detected branch occlusion or aneurysm.
IMPRESSION: Brain MRI:

1. No acute intracranial finding.  No acute infarct.
2. Chronic small vessel disease and small remote cortical infarcts.
3. Posterior scalp contusion.
4. Moderate motion artifact

Intracranial MRA:

Motion artifact with multiple nondiagnostic slices. Unremarkable
vessels where visible.

## 2020-11-25 IMAGING — DX DG ABD PORTABLE 1V
1 series · 1 of 1 positions shown · non-contrast
Comparison: None.

CLINICAL DATA: Fall at home

EXAM:
PORTABLE ABDOMEN - 1 VIEW

[abdomen]
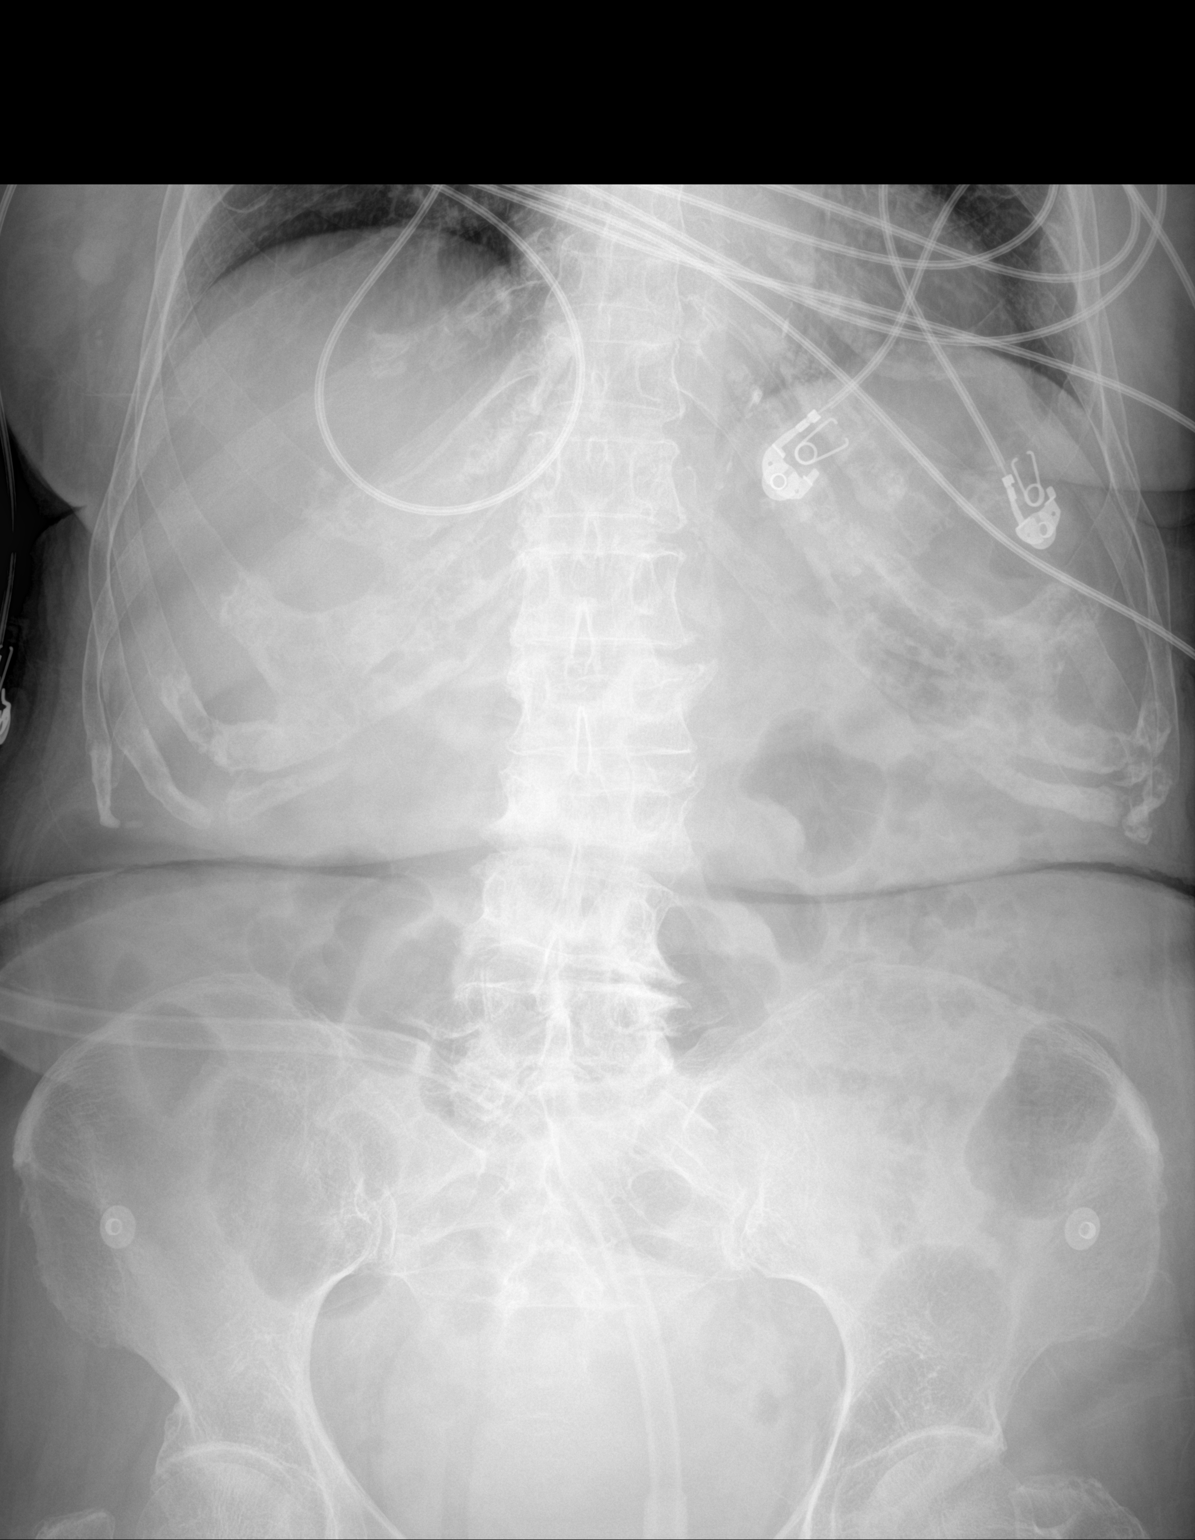

[1 of 1 positions shown; findings below may reference images not displayed]

FINDINGS: Normal bowel gas pattern. No concerning mass effect or
calcification. Generalized osteopenia and lumbar spine degeneration.
L1 and L2 wedging, chronic at L2 when compared to a [RF] abdominal
CT. Given the degree of T12-L1 spurring, L1 wedging is likely also
chronic.
IMPRESSION: Normal bowel gas pattern.

## 2020-11-25 MED ORDER — AMLODIPINE BESYLATE 5 MG PO TABS
5.0000 mg | ORAL_TABLET | Freq: Every day | ORAL | Status: DC
  Administered 2020-11-26 – 2020-11-27 (×2): 5 mg via ORAL
  Filled 2020-11-25 (×3): qty 1

## 2020-11-25 MED ORDER — POTASSIUM CHLORIDE CRYS ER 20 MEQ PO TBCR
40.0000 meq | EXTENDED_RELEASE_TABLET | ORAL | Status: AC
  Administered 2020-11-25: 40 meq via ORAL
  Filled 2020-11-25: qty 2

## 2020-11-25 MED ORDER — HYDRALAZINE HCL 25 MG PO TABS
25.0000 mg | ORAL_TABLET | Freq: Four times a day (QID) | ORAL | Status: DC | PRN
  Administered 2020-11-26: 25 mg via ORAL
  Filled 2020-11-25: qty 1

## 2020-11-25 NOTE — ED Notes (Signed)
Back from mri

## 2020-11-25 NOTE — Progress Notes (Signed)
[facility]  PROGRESS NOTE  [name] MWN:[id number] DOB: 08-03-1924 DOA: 11/24/2020 PCP: Virgie Dad, MD   Brief HPI:   87 year old female with medical history of hypertension, hypothyroidism, CVA, dementia, chronic back pain, gout presented after having seizure-like activity.  History obtained from patient's son over the phone.  As per son patient stood up to go somewhere with her walker and fell.  Medical assistant helped her up and settled in the chair went to get machine to get vitals.  When he came back patient was found to be lethargic and bleeding at side of the mouth having bitten the tongue.  Per EMS patient fell backwards hitting her head and report of dizziness.  In the ED CT of the head and cervical spine did not show acute abnormality or fracture.  There was remote left frontal lobe infarct with parenchymal volume loss.  Neurology was consulted.    Subjective   Patient seen and examined, no new complaints.   Assessment/Plan:     Fall with seizure-like activity -After fall patient had tongue biting, concern for seizure -Neurology was consulted; felt this to be a postconcussive seizure -EEG was unremarkable -MRI brain showed no acute intracranial pathology -Neurology recommends holding off on antiseizure medications at this time -Also recommends to change tramadol to some other opioid as tramadol can lower seizure threshold -Outpatient follow-up with neurology, as needed  Acute kidney injury on CKD stage IIIb -Creatinine on admission 1.37, BUN 12; baseline creatinine 0.9 last month -Started on normal saline at 75 mL/h -Follow renal function in a.m.  Hypertensive urgency -Patient presented with elevated BP of 184/107 -At home takes benazepril; currently on hold due to renal failure -Continue as needed IV hydralazine -Start amlodipine 5 mg daily  Dementia without behavioral disturbance -Continue Namenda  Hypokalemia -Replace potassium and follow BMP  in am  Hypothyroidism -TSH was 2.34 -Continue levothyroxine  Abnormal chest x-Rippeon -Chest x-Grills shows normal density over right base, favored to represent confluence of shadows or atelectasis -We will obtain PA and lateral chest x-Kerin     Data Reviewed:   CBG:  No results for input(s): GLUCAP in the last 168 hours.  SpO2: 98 %    Vitals:   11/25/20 0956 11/25/20 1000 11/25/20 1100 11/25/20 1305  BP:  (!) 167/71 (!) 173/85 (!) 192/80  Pulse: 78 79 85 90  Resp: 19 (!) 21  (!) 21  Temp:      SpO2: 97% 97% 99% 98%     Intake/Output Summary (Last 24 hours) at 11/25/2020 1525 Last data filed at 11/25/2020 0948 Gross per 24 hour  Intake 99.6 ml  Output 600 ml  Net -500.4 ml    09/16 1901 - 09/18 0700 In: 99.6  Out: -   There were no vitals filed for this visit.  Data Reviewed: Basic Metabolic Panel: Recent Labs  Lab 11/24/20 1322 11/24/20 1715 11/25/20 0955  NA 141  --  141  K 3.0*  --  3.2*  CL 103  --  106  CO2 25  --  24  GLUCOSE 107*  --  81  BUN 12  --  9  CREATININE 1.37*  --  0.94  CALCIUM 9.1  --  9.0  MG  --  1.8  --    Liver Function Tests: Recent Labs  Lab 11/24/20 1322  AST 22  ALT 11  ALKPHOS 87  BILITOT 0.7  PROT 6.3*  ALBUMIN 3.4*   No results for input(s): LIPASE,  AMYLASE in the last 168 hours. No results for input(s): AMMONIA in the last 168 hours. CBC: Recent Labs  Lab 11/24/20 1322  WBC 12.7*  NEUTROABS 10.4*  HGB 13.8  HCT 42.3  MCV 101.7*  PLT 249   Cardiac Enzymes: Recent Labs  Lab 11/24/20 1715  CKTOTAL 111   BNP (last 3 results) No results for input(s): BNP in the last 8760 hours.  ProBNP (last 3 results) No results for input(s): PROBNP in the last 8760 hours.  CBG: No results for input(s): GLUCAP in the last 168 hours.  Recent Results (from the past 240 hour(s))  Resp Panel by RT-PCR (Flu A&B, Covid) Nasopharyngeal Swab     Status: None   Collection Time: 11/24/20  1:24 PM   Specimen: Nasopharyngeal  Swab; Nasopharyngeal(NP) swabs in vial transport medium  Result Value Ref Range Status   SARS Coronavirus 2 by RT PCR NEGATIVE NEGATIVE Final    Comment: (NOTE) SARS-CoV-2 target nucleic acids are NOT DETECTED.  The SARS-CoV-2 RNA is generally detectable in upper respiratory specimens during the acute phase of infection. The lowest concentration of SARS-CoV-2 viral copies this assay can detect is 138 copies/mL. A negative result does not preclude SARS-Cov-2 infection and should not be used as the sole basis for treatment or other patient management decisions. A negative result may occur with  improper specimen collection/handling, submission of specimen other than nasopharyngeal swab, presence of viral mutation(s) within the areas targeted by this assay, and inadequate number of viral copies(<138 copies/mL). A negative result must be combined with clinical observations, patient history, and epidemiological information. The expected result is Negative.  Fact Sheet for Patients:  EntrepreneurPulse.com.au  Fact Sheet for Healthcare Providers:  IncredibleEmployment.be  This test is no t yet approved or cleared by the [place] FDA and  has been authorized for detection and/or diagnosis of SARS-CoV-2 by FDA under an Emergency Use Authorization (EUA). This EUA will remain  in effect (meaning this test can be used) for the duration of the COVID-19 declaration under Section 564(b)(1) of the Act, 21 U.S.C.section 360bbb-3(b)(1), unless the authorization is terminated  or revoked sooner.       Influenza A by PCR NEGATIVE NEGATIVE Final   Influenza B by PCR NEGATIVE NEGATIVE Final    Comment: (NOTE) The Xpert Xpress SARS-CoV-2/FLU/RSV plus assay is intended as an aid in the diagnosis of influenza from Nasopharyngeal swab specimens and should not be used as a sole basis for treatment. Nasal washings and aspirates are unacceptable for Xpert Xpress  SARS-CoV-2/FLU/RSV testing.  Fact Sheet for Patients: EntrepreneurPulse.com.au  Fact Sheet for Healthcare Providers: IncredibleEmployment.be  This test is not yet approved or cleared by the [place] FDA and has been authorized for detection and/or diagnosis of SARS-CoV-2 by FDA under an Emergency Use Authorization (EUA). This EUA will remain in effect (meaning this test can be used) for the duration of the COVID-19 declaration under Section 564(b)(1) of the Act, 21 U.S.C. section 360bbb-3(b)(1), unless the authorization is terminated or revoked.  Performed at [facility], [place] [address] [place], [place] [address]         Scheduled medications:    acetaminophen  650 mg Oral TID   allopurinol  100 mg Oral q AM   aspirin  81 mg Oral q AM   chlorhexidine gluconate (MEDLINE KIT)  15 mL Mouth Rinse BID   enoxaparin (LOVENOX) injection  40 mg Subcutaneous Q24H   [START ON 11/28/2020] levothyroxine  100 mcg Oral  Once per day on Wed   levothyroxine  50 mcg Oral Once per day on Sun Mon Tue Thu Fri Sat   mouth rinse  15 mL Mouth Rinse 10 times per day   memantine  10 mg Oral BID    Antibiotics: Anti-infectives (From admission, onward)    None        Radiology Reports  CT HEAD WO CONTRAST  Result Date: 11/24/2020 CLINICAL DATA:  Altered mental status, fall EXAM: CT HEAD WITHOUT CONTRAST TECHNIQUE: Contiguous axial images were obtained from the base of the skull through the vertex without intravenous contrast. COMPARISON:  CT head 08/02/2020 FINDINGS: Brain: There is no evidence of acute intracranial hemorrhage, extra-axial fluid collection, or acute infarct. A remote infarct in the left parietal lobe is unchanged. Mild parenchymal volume loss and chronic white matter microangiopathy are unchanged. The ventricles are stable in size. There is no mass lesion. There is no midline shift. Vascular: There is calcification of the bilateral  cavernous ICAs. Skull: Normal. Negative for fracture or focal lesion. Sinuses/Orbits: The paranasal sinuses are clear. The globes and orbits are unremarkable. Other: None. IMPRESSION: 1. No acute intracranial pathology. 2. Unchanged remote left parietal lobe infarct, parenchymal volume loss, and chronic white matter microangiopathy. Electronically Signed   By: Valetta Mole M.D.   On: 11/24/2020 14:29   CT CERVICAL SPINE WO CONTRAST  Result Date: 11/24/2020 CLINICAL DATA:  Trauma, fall EXAM: CT CERVICAL SPINE WITHOUT CONTRAST TECHNIQUE: Multidetector CT imaging of the cervical spine was performed without intravenous contrast. Multiplanar CT image reconstructions were also generated. COMPARISON:  CT cervical spine 08/02/2020 FINDINGS: Alignment: There is straightened curvature of the cervical spine with slight focal kyphosis centered at C5, similar to the prior study. There is grade 1 anterolisthesis of C3 on C4 and C4 on C5, also unchanged and likely degenerative in nature. There is no evidence of traumatic malalignment. There is no jumped or perched facet. Skull base and vertebrae: Skull base alignment is maintained. Vertebral body heights are preserved. There is no evidence of acute fracture. Soft tissues and spinal canal: No prevertebral fluid or swelling. No visible canal hematoma. Disc levels: There is marked intervertebral disc space narrowing at C5-C6, similar to the prior study. Degenerative endplate changes also most advanced at this level. There is multilevel facet arthropathy, similar to the prior study. There is mild narrowing of the craniocervical junction. The osseous spinal canal is otherwise patent. Upper chest: There is mosaic attenuation in the lung apices with mild smooth interlobular septal thickening. Other: The left thyroid lobe is surgically absent. The soft tissues are otherwise unremarkable. IMPRESSION: 1. No acute fracture or traumatic malalignment of the cervical spine. 2. Multilevel  degenerative changes as above, similar to the prior study. 3. Smooth interlobular septal thickening in the lung apices can be seen with pulmonary interstitial edema. Mosaic attenuation in the lung apices suggests air trapping/small airway disease. Electronically Signed   By: Valetta Mole M.D.   On: 11/24/2020 14:22   MR ANGIO HEAD WO CONTRAST  Result Date: 11/25/2020 CLINICAL DATA:  TIA EXAM: MRI HEAD WITHOUT CONTRAST MRA HEAD WITHOUT CONTRAST TECHNIQUE: Multiplanar, multi-echo pulse sequences of the brain and surrounding structures were acquired without intravenous contrast. Angiographic images of the Circle of Willis were acquired using MRA technique without intravenous contrast. COMPARISON:  Head CT from yesterday FINDINGS: MRI HEAD FINDINGS Brain: No acute infarction, hemorrhage, hydrocephalus, extra-axial collection or mass lesion. Remote bilateral occipital and left parietal cortically based infarcts. Chronic small vessel  ischemia in the deep white matter which is mild for age. Nonspecific cerebral volume loss. Vascular: Major flow voids are preserved Skull and upper cervical spine: Posterior scalp swelling. No evidence of bone lesion. Sinuses/Orbits: Bilateral cataract resection.  No evidence of injury Other: Moderate motion artifact to the degree that findings could be obscured. MRA HEAD FINDINGS Significant motion artifact intermittently, especially at the level of the circle-of-Willis. The covered vertebral, basilar, and carotid arteries are patent. A1, M1, and P1 segments are largely obscured by motion artifact. No detected branch occlusion or aneurysm. IMPRESSION: Brain MRI: 1. No acute intracranial finding.  No acute infarct. 2. Chronic small vessel disease and small remote cortical infarcts. 3. Posterior scalp contusion. 4. Moderate motion artifact Intracranial MRA: Motion artifact with multiple nondiagnostic slices. Unremarkable vessels where visible. Electronically Signed   By: Jorje Guild  M.D.   On: 11/25/2020 06:39   MR BRAIN WO CONTRAST  Result Date: 11/25/2020 CLINICAL DATA:  TIA EXAM: MRI HEAD WITHOUT CONTRAST MRA HEAD WITHOUT CONTRAST TECHNIQUE: Multiplanar, multi-echo pulse sequences of the brain and surrounding structures were acquired without intravenous contrast. Angiographic images of the Circle of Willis were acquired using MRA technique without intravenous contrast. COMPARISON:  Head CT from yesterday FINDINGS: MRI HEAD FINDINGS Brain: No acute infarction, hemorrhage, hydrocephalus, extra-axial collection or mass lesion. Remote bilateral occipital and left parietal cortically based infarcts. Chronic small vessel ischemia in the deep white matter which is mild for age. Nonspecific cerebral volume loss. Vascular: Major flow voids are preserved Skull and upper cervical spine: Posterior scalp swelling. No evidence of bone lesion. Sinuses/Orbits: Bilateral cataract resection.  No evidence of injury Other: Moderate motion artifact to the degree that findings could be obscured. MRA HEAD FINDINGS Significant motion artifact intermittently, especially at the level of the circle-of-Willis. The covered vertebral, basilar, and carotid arteries are patent. A1, M1, and P1 segments are largely obscured by motion artifact. No detected branch occlusion or aneurysm. IMPRESSION: Brain MRI: 1. No acute intracranial finding.  No acute infarct. 2. Chronic small vessel disease and small remote cortical infarcts. 3. Posterior scalp contusion. 4. Moderate motion artifact Intracranial MRA: Motion artifact with multiple nondiagnostic slices. Unremarkable vessels where visible. Electronically Signed   By: Jorje Guild M.D.   On: 11/25/2020 06:39   DG Pelvis Portable  Result Date: 11/25/2020 CLINICAL DATA:  Fall at home. EXAM: PORTABLE PELVIS 1-2 VIEWS COMPARISON:  None. FINDINGS: There is no evidence of pelvic fracture or diastasis. No pelvic bone lesions are seen. Atherosclerosis and osteopenia.  IMPRESSION: No acute finding Electronically Signed   By: Jorje Guild M.D.   On: 11/25/2020 04:11   DG Chest Portable 1 View  Result Date: 11/24/2020 CLINICAL DATA:  Syncope. EXAM: PORTABLE CHEST 1 VIEW COMPARISON:  Jul 15, 2019 FINDINGS: Stable cardiomegaly. The hila and mediastinum are unremarkable. No pneumothorax. No overt edema or focal infiltrate. Nodular density projected over the right base is favored to represent confluence of shadows or atelectasis. No other acute abnormalities. IMPRESSION: A nodular density projected over the right base is favored to represent confluence of shadows or atelectasis. Recommend a PA and lateral chest x-Gorka before discharge. No acute abnormalities are seen. Electronically Signed   By: Dorise Bullion III M.D.   On: 11/24/2020 13:45   DG Abd Portable 1V  Result Date: 11/25/2020 CLINICAL DATA:  Fall at home EXAM: PORTABLE ABDOMEN - 1 VIEW COMPARISON:  None. FINDINGS: Normal bowel gas pattern. No concerning mass effect or calcification. Generalized osteopenia and lumbar  spine degeneration. L1 and L2 wedging, chronic at L2 when compared to a 2011 abdominal CT. Given the degree of T12-L1 spurring, L1 wedging is likely also chronic. IMPRESSION: Normal bowel gas pattern. Electronically Signed   By: Jorje Guild M.D.   On: 11/25/2020 04:11   EEG adult  Result Date: 11/24/2020 Derek Jack, MD     11/24/2020  8:40 PM Routine EEG Report CLARIE CAMEY is a 87 y.o. female with a history of spell who is undergoing an EEG to evaluate for seizures. Report: This EEG was acquired with electrodes placed according to the International 10-20 electrode system (including Fp1, Fp2, F3, F4, C3, C4, P3, P4, O1, O2, T3, T4, T5, T6, A1, A2, Fz, Cz, Pz). The following electrodes were missing or displaced: none. There was no waking occipital dominant rhythm. The best background was 6-7 Hz. This activity is reactive to stimulation. Sleep was identified by K complexes and sleep spindles.  There was no focal slowing. There were no definitive interictal epileptiform discharges. There were no electrographic seizures identified. Photic stimulation and hyperventilation were not performed. Impression and clinical correlation: This EEG was obtained while asleep and is abnormal due to mild-to-moderate diffuse slowing. There were no electrographic seizures or definitive epileptiform abnormalities seen during this recording. Su Monks, MD [facility] [phone] If 7pm- 7am, please page neurology on call as listed in [place].      DVT prophylaxis: Lovenox  Code Status: DNR  Family Communication: No family at bedside   Consultants: Neurology  Procedures:     Objective    Physical Examination:  General-appears in no acute distress Heart-S1-S2, regular, no murmur auscultated Lungs-clear to auscultation bilaterally, no wheezing or crackles auscultated Abdomen-soft, nontender, no organomegaly Extremities-no edema in the lower extremities Neuro-alert, oriented x3, no focal deficit noted   Status is: Inpatient  Dispo: The patient is from: Skilled nursing facility              Anticipated d/c is to: Skilled nursing facility              Anticipated d/c date is: 11/28/2020              Patient currently currently not stable for discharge  Barrier to discharge-ongoing evaluation for seizure, AKI  COVID-19 Labs  No results for input(s): DDIMER, FERRITIN, LDH, CRP in the last 72 hours.  Lab Results  Component Value Date   [place] NEGATIVE 11/24/2020           Oswald Hillock   [facility] If 7PM-7AM, please contact night-coverage at [email or web address], Office  [phone]   11/25/2020, 3:25 PM  LOS: 0 days

## 2020-11-25 NOTE — Progress Notes (Signed)
Received pt on the unit. Pt oriented to unit with bed locked in the lowest position. Call bell is within reach. Pt sat up with dinner tray over her.

## 2020-11-25 NOTE — Consult Note (Signed)
Neurology Consultation Reason for Consult: seizure vs. syncope Requesting Physician: Fuller Plan  CC: Dizziness, fall and subsequent GTC  History is obtained from:Primarily from chart review, POA could not be reached overnight  HPI: [name] is a 87 y.o. female with a past medical history significant for dementia, depression, hypertension, hypothyroidism, peripheral neuropathy, gout, stage III chronic kidney disease (GFR 30-59), cervical degenerative disc disease and T5/T8/L3 compression fractures on chronic tramadol.  Per primary team's notes, patient had a fall, hit her head and subsequently had seizure-like activity with tongue bite for which neurology was consulted  Per review of nursing home notes, as recently as 08/03/2020 she complained of dizziness when she is getting out of bed and has had some TIAs with, for example right leg weakness on 5/26 and on 09/21/2020 (found on the floor, not speaking or following for commands for about 10 minutes, then slurred speech for about 15 minutes with dizziness, resolving without intervention for which POA declined hospital evaluation and preferred PCP evaluation on 7/18; subsequent PCP note 7/19 "Her POA wanted to talk about referal to Neurology and TIA. He is not sure if he wants her to go anywhere as [name] does not want anything Aggressive Will start her on Aspirin 325 mg QD for 3 weeks and then baby aspirin. No further testing or Referal for now."  Regarding the course of her dementia, per PCP notes "MMSE dropped from 01/02/19 (29/30) to11/3/21 MMSE (18/30), failed clock drawing," favored to be vascular dementia  Additionally meclizine appears on her home medication list  ROS: Unable to obtain due to mental status   Past medical history and family history reviewed in linked chart.  Pertinent history as above  Current Outpatient Medications  Medication Instructions   allopurinol (ZYLOPRIM) 100 mg, Oral, Every morning   aspirin 81 mg,  Oral, Every morning   benazepril (LOTENSIN) 10 mg, Oral, Every morning   Calcium Carb-Cholecalciferol 500-400 MG-UNIT TABS 2 tablets, Oral, Every morning   Cholecalciferol 2,000 Units, Oral, Every morning   Co Q-10 200 mg, Oral, Every morning   meclizine (ANTIVERT) 12.5 mg, Oral, 2 times daily PRN   memantine (NAMENDA) 10 mg, Oral, 2 times daily   Omega III EPA+DHA 2,000 mg, Oral, 2 times daily   Synthroid 100 mcg, Oral, See admin instructions, Take 100 mcg by mouth in the morning before breakfast only on Wednesdays   Synthroid 50 mcg, Oral, See admin instructions, Take 50 mcg by mouth in the morning before breakfast on Sun/Mon/Tues/Thurs/Fri/Sat   traMADol (ULTRAM) 50 mg, Oral, 3 times daily with meals   Tylenol 650 mg, Oral, See admin instructions, Take 650 mg by mouth three times a day with meals and CANNOT EXCEED 3,000 MG/24 HOURS OF TYLENOL FROM ALL COMBINED SOURCES   Social History:  has no history on file for tobacco use, alcohol use, and drug use.  Exam: Current vital signs: BP (!) 160/79   Pulse 74   Temp 98.6 F (37 C)   Resp 16   SpO2 97%  Vital signs in last 24 hours: Temp:  [98.6 F (37 C)] 98.6 F (37 C) (09/17 1356) Pulse Rate:  [70-96] 74 (09/17 2100) Resp:  [12-19] 16 (09/17 2100) BP: (140-187)/(67-107) 160/79 (09/17 2100) SpO2:  [94 %-100 %] 97 % (09/17 2100)   Physical Exam  Constitutional: Appears well-developed and well-nourished.  Psych: Affect guarded and minimally interactive Eyes: No scleral injection HENT: No oropharyngeal obstruction.  MSK: no joint deformities.  Cardiovascular: Normal rate  and regular rhythm.  Respiratory: Effort normal, non-labored breathing GI: Soft.  No distension. There is no tenderness.  Skin: Warm dry and intact visible skin, scattered bruising  Neuro: Mental Status: Patient is sleeping and requires repeated stimulation to awaken.  She reports that it is 2022 but reports she does not know the month, does not know where  she is, and either does not understand or chooses not to participate in much of the examination Cranial Nerves: II: Visual Fields are full to orienting to stimuli in all fields. Pupils difficult to examine as she closes her eyes immediately III,IV, VI: EOMI to briefly tracking examiner V: Facial sensation is symmetric to eyelash brush VII: Facial movement is symmetric on casual evaluation of speech.  VIII: hearing is intact to voice Remainder unable to assess given mental status Motor/sensory: Tone is normal. Bulk is normal.  She moves all 4 extremities grossly equally and antigravity and responds grossly equally to touch in all 4 extremities Deep Tendon Reflexes: 2+ and symmetric in the brachioradialis, difficult to elicit at the patellae Cerebellar: Does not participate  I have reviewed labs in epic and the results pertinent to this consultation are: CMP notable for hypokalemia to 3.0, creatinine 1.37 (baseline 0.9), hypoalbuminemia 3.4; magnesium 1.8 New leukocytosis to 12.7 (baseline 8); MCV 101.7 CK 111 Flu/COVID panel negative UA negative for infection  I have reviewed the images obtained: Head CT personally reviewed, agree with radiology read: No acute intracranial injury.  No calvarial fracture.   Remote infarcts within the occipital cortices bilaterally again noted. Interval development of remote infarcts within the left parietal lobe and periventricular white matter. Stable senescent change. No acute fracture or listhesis of the cervical spine.   Advanced multilevel degenerative disc and degenerative joint disease, as described above, resulting in multilevel mild-to-moderate neuroforaminal narrowing, most severe bilaterally at C4-5 and on the left at C5-6.  Portable chest x-[place]: A nodular density projected over the right base is favored to represent confluence of shadows or atelectasis. Recommend a PA and lateral chest x-[place] before discharge.  EEG: "This EEG was obtained  while asleep and is abnormal due to mild-to-moderate diffuse slowing. There were no electrographic seizures or definitive epileptiform abnormalities seen during this recording."  MRI brain personally reviewed, agree with radiology limited evaluation in the setting of significant motion artifact but no acute intracranial process within this limitation  Impression: Overall given the history, I suspect postconcussive seizure, which would not be an indication for antiseizure medications.  EEG is reassuring against ongoing seizure activity or high risk for epilepsy, though a single routine study does not rule out seizure disorder.  MRI brain is reassuring against acute intracranial process.  Given no prior history of seizures and a single provoked event without focality, would not start antiseizure medications at this time  Recommendations: -MRI brain seizure protocol completed on my recommendation as above -MRA head to evaluate for potential vertebrobasilar insufficiency especially in the setting of her progressive parietal lesions, recommended but unfortunately nondiagnostic.  Given this study was ordered more for diagnostic clarity then to change management, will not be aggressive about obtaining it -Chest and abdominal x-rays for MRI clearance completed -Hold on antiseizure medications at this time -Consider changing tramadol to a medication that lowers the seizure threshold less, choice of pain control per primary team.  Please note other opiates are not as likely to reduce seizure threshold as tramadol -Outpatient follow-up with neurology if desired by [place] MD-PhD [facility] [phone]  Available 7 PM to 7 AM, outside of these hours please call Neurologist on call as listed on Amion.

## 2020-11-26 ENCOUNTER — Inpatient Hospital Stay (HOSPITAL_COMMUNITY): Payer: Medicare Other

## 2020-11-26 LAB — BASIC METABOLIC PANEL
Anion gap: 15 (ref 5–15)
BUN: 5 mg/dL — ABNORMAL LOW (ref 8–23)
CO2: 21 mmol/L — ABNORMAL LOW (ref 22–32)
Calcium: 9.2 mg/dL (ref 8.9–10.3)
Chloride: 104 mmol/L (ref 98–111)
Creatinine, Ser: 0.85 mg/dL (ref 0.44–1.00)
GFR, Estimated: >60 mL/min (ref >60)
Glucose, Bld: 98 mg/dL (ref 70–99)
Potassium: 3.8 mmol/L (ref 3.5–5.1)
Sodium: 140 mmol/L (ref 135–145)

## 2020-11-26 IMAGING — CR DG CHEST 2V
2 series · 2 of 2 positions shown · non-contrast
Comparison: [DATE]

CLINICAL DATA: Fall at [HOSPITAL], syncope, altered mental
status, abnormal chest radiograph question RIGHT base lung mass

EXAM:
CHEST - 2 VIEW

[chest lat]
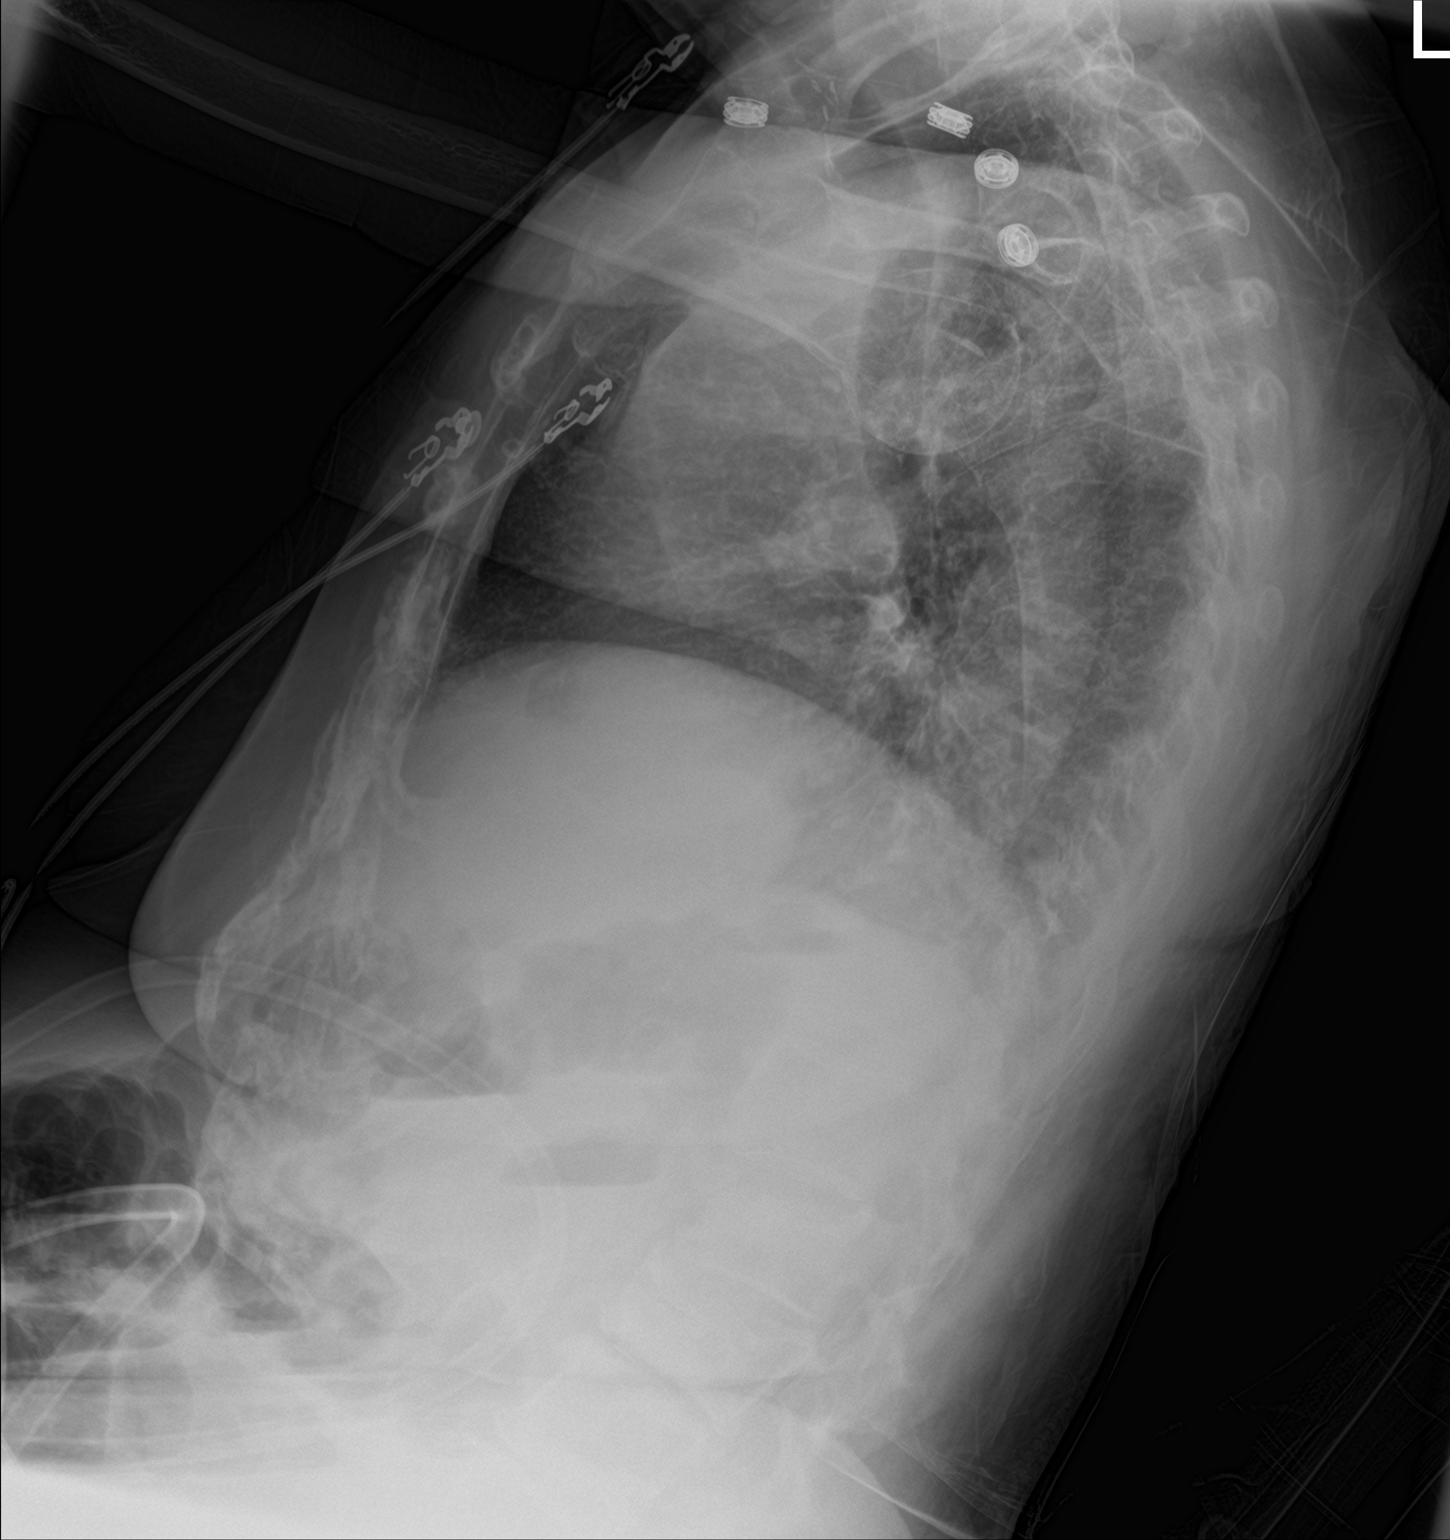

[chest ap]
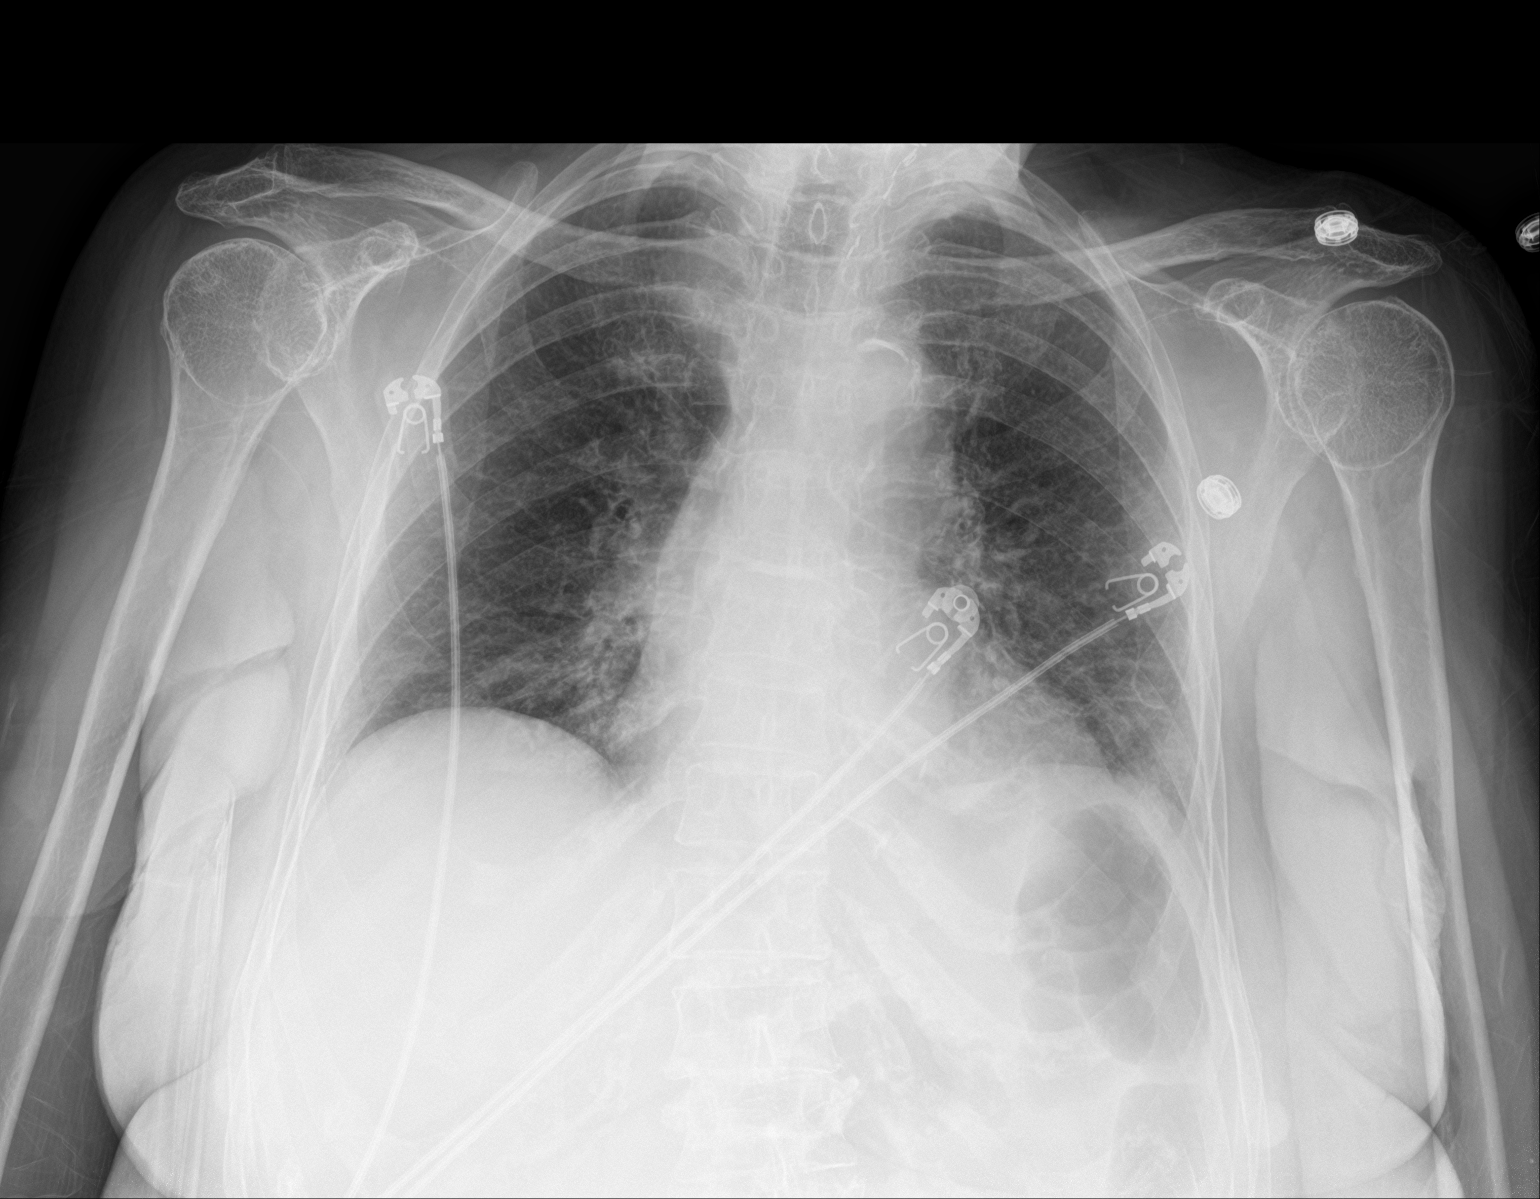

[2 of 2 positions shown; findings below may reference images not displayed]

FINDINGS: Normal heart size, mediastinal contours, and pulmonary vascularity.

Atherosclerotic calcification aorta.

Bronchitic changes with bibasilar atelectasis.

No infiltrate, pleural effusion, or pneumothorax.

Area of question nodularity on the previous exam is partially
obscured by the diaphragm on the current study, suboptimally
assessed.

This could either represent a lung nodule or be associated with
calcification within anterior costal cartilage associated with the
anterior RIGHT fourth rib.
IMPRESSION: Bronchitic changes with bibasilar atelectasis.

Questionable nodular density versus artifact from costal
cartilaginous calcification at the anterior RIGHT fourth rib,
suboptimally assessed; if clinically indicated based on patient age
and comorbidities, this could be further assessed by CT.

## 2020-11-26 MED ORDER — ONDANSETRON HCL 4 MG/2ML IJ SOLN
4.0000 mg | Freq: Four times a day (QID) | INTRAMUSCULAR | Status: DC | PRN
  Administered 2020-11-26 – 2020-11-27 (×2): 4 mg via INTRAVENOUS
  Filled 2020-11-26 (×2): qty 2

## 2020-11-26 MED ORDER — ASPIRIN 81 MG PO CHEW: 81.0000 mg | CHEWABLE_TABLET | Freq: Every day | ORAL | Status: DC

## 2020-11-26 MED ORDER — ASPIRIN 325 MG PO TABS
325.0000 mg | ORAL_TABLET | Freq: Every day | ORAL | Status: DC
  Administered 2020-11-26 – 2020-11-27 (×2): 325 mg via ORAL
  Filled 2020-11-26 (×2): qty 1

## 2020-11-26 MED ORDER — LOPERAMIDE HCL 2 MG PO CAPS
2.0000 mg | ORAL_CAPSULE | ORAL | Status: DC | PRN
  Administered 2020-11-26: 2 mg via ORAL
  Filled 2020-11-26: qty 1

## 2020-11-26 MED ORDER — AMLODIPINE BESYLATE 5 MG PO TABS: 5.0000 mg | ORAL_TABLET | Freq: Every day | ORAL | 3 refills | Status: DC

## 2020-11-26 MED ORDER — LORAZEPAM 2 MG/ML IJ SOLN
0.2500 mg | Freq: Once | INTRAMUSCULAR | Status: AC
  Administered 2020-11-26: 0.25 mg via INTRAVENOUS
  Filled 2020-11-26: qty 1

## 2020-11-26 MED ORDER — BENAZEPRIL HCL 20 MG PO TABS
10.0000 mg | ORAL_TABLET | Freq: Every day | ORAL | Status: DC
  Administered 2020-11-26 – 2020-11-27 (×2): 10 mg via ORAL
  Filled 2020-11-26 (×2): qty 1

## 2020-11-26 NOTE — Plan of Care (Signed)
  Problem: Activity: Goal: Risk for activity intolerance will decrease Outcome: Progressing   Problem: Nutrition: Goal: Adequate nutrition will be maintained Outcome: Progressing   Problem: Coping: Goal: Level of anxiety will decrease Outcome: Progressing   

## 2020-11-26 NOTE — Evaluation (Signed)
Physical Therapy Evaluation Patient Details Name: [name] MRN: [id number] DOB: [birth year] Today's Date: 11/26/2020  History of Present Illness  [name] is a 87 y.o. female who presents with seizure like activity after a fall, thought to be post concussive and EEG did not show more seizure activity, MRI brain neg for acute abnormalities.  PMH: hypertension, hypothyroidism, CKDIII, peripheral neuropathy, CVA, dementia, chronic back pain with multiple compression fractures (T5, T8, L3), and gout.  Clinical Impression  Pt admitted with above diagnosis. On eval, pt received in bed soiled of urine and diarrhea with hands in it and trying to get to IV having pulled mitts off. Pt required max A to come to EOB and max A +2 to pivot to [facility] to have another BM. Attempted to get pt to take steps beyond pivoting but pt unable or not understanding. At end of session, pt grabbed her abdomen and then began vomiting. HR up to 147 bpm with vomiting and down to 118 bpm afterwards. Unsure of exact PLOF but at current level recommend higher level of care than ALF.  Pt currently with functional limitations due to the deficits listed below (see PT Problem List). Pt will benefit from skilled PT to increase their independence and safety with mobility to allow discharge to the venue listed below.          Recommendations for follow up therapy are one component of a multi-disciplinary discharge planning process, led by the attending physician.  Recommendations may be updated based on patient status, additional functional criteria and insurance authorization.  Follow Up Recommendations SNF;Supervision/Assistance - 24 hour    Equipment Recommendations  None recommended by PT    Recommendations for Other Services       Precautions / Restrictions Precautions Precautions: Fall;Other (comment) Precaution Comments: seizure Restrictions Weight Bearing Restrictions: No      Mobility  Bed Mobility Overal bed  mobility: Needs Assistance Bed Mobility: Supine to Sit;Sit to Supine     Supine to sit: Max assist Sit to supine: Max assist;+2 for physical assistance   General bed mobility comments: max A for LE's off EOB and elevation of trunk into sitting. Pt unable to scoot back into bed before returning to supine so required max A +2 to scoot back and lie down safely.    Transfers Overall transfer level: Needs assistance Equipment used: 2 person hand held assist Transfers: Sit to/from [facility] Sit to Stand: Max assist;+2 physical assistance Stand pivot transfers: +2 physical assistance;Mod assist       General transfer comment: pt soiled of urine and stool upon therapist's entry, with assist from RN pt stood EOB with max A +2 for power up and to steady, pivoted to [facility] with mod A +2. With returning to bed, was able to stand and pivot with mod A of 1 with therapist in front of pt.  Ambulation/Gait             General Gait Details: unable, pt cues to step along EOB in standing, was unable to step feet or did not understand command or tactile cues  Stairs            Wheelchair Mobility    Modified Rankin (Stroke Patients Only)       Balance Overall balance assessment: Needs assistance;History of Falls Sitting-balance support: Feet supported;Bilateral upper extremity supported Sitting balance-Leahy Scale: Poor Sitting balance - Comments: min A to maintain sitting EOB due to posterior lean Postural control: Posterior lean Standing  balance support: Bilateral upper extremity supported;During functional activity Standing balance-Leahy Scale: Poor Standing balance comment: needed external assist to maintain standing                             Pertinent Vitals/Pain Pain Assessment: Faces Faces Pain Scale: Hurts even more Pain Location: stomach Pain Descriptors / Indicators: Grimacing Pain Intervention(s): Monitored during session    Home  Living Family/patient expects to be discharged to:: Assisted living                 Additional Comments: pt unable to give any info but per CSW, pt is from [facility]    Prior Function Level of Independence: Needs assistance               Hand Dominance        Extremity/Trunk Assessment   Upper Extremity Assessment Upper Extremity Assessment: Generalized weakness    Lower Extremity Assessment Lower Extremity Assessment: Generalized weakness    Cervical / Trunk Assessment Cervical / Trunk Assessment: Kyphotic  Communication   Communication: Receptive difficulties;Expressive difficulties;HOH  Cognition Arousal/Alertness: Awake/alert Behavior During Therapy:  (demented) Overall Cognitive Status: No family/caregiver present to determine baseline cognitive functioning Area of Impairment: Orientation;Following commands                 Orientation Level: Disoriented to;Place;Time;Situation     Following Commands: Follows one step commands inconsistently       General Comments: no family to compare to baseline but pt pulling at lines, pill rolling with hands, following <25% of one step commands. This could be her baseline      General Comments General comments (skin integrity, edema, etc.): pt soiled in bed with hands dirty and trying to pull at IV, had pulled mitts off. Verbalizes but no full sentences. Often makes "screaming face".  At end of session pt grabbed her stomach as if she was in pain, then began to vomit. RN present. HR up to 147 bpm with vomiting. Returned to 118 bpm after    Exercises     Assessment/Plan    PT Assessment Patient needs continued PT services  PT Problem List Decreased strength;Decreased activity tolerance;Decreased balance;Decreased mobility;Decreased cognition;Decreased safety awareness;Decreased knowledge of precautions;Pain       PT Treatment Interventions DME instruction;Gait training;Functional mobility  training;Therapeutic activities;Therapeutic exercise;Balance training;Patient/family education;Cognitive remediation;Neuromuscular re-education    PT Goals (Current goals can be found in the Care Plan section)  Acute Rehab PT Goals Patient Stated Goal: none stated PT Goal Formulation: Patient unable to participate in goal setting Time For Goal Achievement: 12/10/20 Potential to Achieve Goals: Fair    Frequency Min 3X/week   Barriers to discharge        Co-evaluation               AM-PAC PT "6 Clicks" Mobility  Outcome Measure Help needed turning from your back to your side while in a flat bed without using bedrails?: A Lot Help needed moving from lying on your back to sitting on the side of a flat bed without using bedrails?: A Lot Help needed moving to and from a bed to a chair (including a wheelchair)?: A Lot Help needed standing up from a chair using your arms (e.g., wheelchair or bedside chair)?: A Lot Help needed to walk in hospital room?: Total Help needed climbing 3-5 steps with a railing? : Total 6 Click Score: 10    End  of Session   Activity Tolerance: Patient tolerated treatment well Patient left: in bed;with bed alarm set;with nursing/sitter in room;with call bell/phone within reach Nurse Communication: Mobility status PT Visit Diagnosis: Muscle weakness (generalized) (M62.81);Pain;Difficulty in walking, not elsewhere classified (R26.2);Unsteadiness on feet (R26.81) Pain - part of body:  (abdomen)    Time: 0388-8280 PT Time Calculation (min) (ACUTE ONLY): 38 min   Charges:   PT Evaluation $PT Eval Moderate Complexity: 1 Mod PT Treatments $Therapeutic Activity: 23-37 mins        Leighton Roach, [place]  Pager [phone] Office [place] 11/26/2020, 3:05 PM

## 2020-11-26 NOTE — Discharge Summary (Addendum)
Physician Discharge Summary  FRANKEE LOCKLER [id number] DOB: 23-Jul-1924 DOA: 11/24/2020  PCP: Virgie Dad, MD  Admit date: 11/24/2020 Discharge date: 11/27/2020  Time spent: 60 minutes  Recommendations for Outpatient Follow-up:  Questionable lung density seen on chest x-[place]; recommend getting CT chest as outpatient in 3 to 4 weeks  Discharge Diagnoses:  Principal Problem:   Seizure-like activity ([place]) Active Problems:   Leukocytosis   Acute kidney injury superimposed on chronic kidney disease ([place])   Hypertensive urgency   Dementia with behavioral disturbance ([place])   History of stroke   Hypokalemia   Hypothyroidism   DNR (do not resuscitate)   AKI (acute kidney injury) ([place])   Discharge Condition: Stable  Diet recommendation: Heart healthy diet  There were no vitals filed for this visit.  History of present illness:  87 year old female with medical history of hypertension, hypothyroidism, CVA, dementia, chronic back pain, gout presented after having seizure-like activity.  History obtained from patient's son over the phone.  As per son patient stood up to go somewhere with her walker and fell.  Medical assistant helped her up and settled in the chair went to get machine to get vitals.  When he came back patient was found to be lethargic and bleeding at side of the mouth having bitten the tongue.  Per EMS patient fell backwards hitting her head and report of dizziness.  Hospital Course:   Fall with seizure-like activity -After fall patient had tongue biting, concern for seizure -Neurology was consulted; felt this to be a postconcussive seizure -EEG was unremarkable -MRI brain showed no acute intracranial pathology -Neurology recommends holding off on antiseizure medications at this time -Also recommends to change tramadol to some other opioid as tramadol can lower seizure threshold -We will discontinue tramadol -Outpatient follow-up with neurology, as needed   Acute  kidney injury on CKD stage IIIb -Creatinine on admission 1.37, BUN 12; baseline creatinine 0.9 last month -Started on normal saline at 75 mL/h -Creatinine is back to baseline at 0.85   Hypertensive urgency -Resolved -Patient presented with elevated BP of 184/107 -At home takes benazepril;  -Started on amlodipine 5 mg daily   Dementia without behavioral disturbance -Continue Namenda   Hypokalemia -Replete   Hypothyroidism -TSH was 2.34 -Continue levothyroxine   Abnormal chest x-[place] -Chest x-[place] shows nodular density over right base, favored to represent confluence of shadows or atelectasis -PA and lateral chest x-[place] also did not clearly show the density -Recommend getting CT chest as outpatient if desired for further evaluation of questionable nodular density      Procedures:   Consultations: Neurology  Discharge Exam: Vitals:   11/26/20 1101 11/26/20 1116  BP: (!) 163/124 (!) 166/98  Pulse:  98  Resp:    Temp:  98 F (36.7 C)  SpO2:  98%    General: Appears in no acute distress Cardiovascular: S1-S2, regular, no murmur auscultated Respiratory: Clear to auscultation bilaterally  Discharge Instructions   Discharge Instructions     Diet - low sodium heart healthy   Complete by: As directed    Increase activity slowly   Complete by: As directed       Allergies as of 11/26/2020       Reactions   Actonel [risedronate] Other (See Comments)   "Allergic," per [facility]   Hydrochlorothiazide Other (See Comments)   "Allergic," per [facility]   Lipitor [atorvastatin] Other (See Comments)   "Allergic," per [facility]   Neomycin Other (See Comments)   "Allergic," per [facility]  Polysporin [bacitracin-polymyxin B] Other (See Comments)   "Allergic," per [facility]   Prolia [denosumab] Other (See Comments)   Sulfa Antibiotics Other (See Comments)   "Allergic," per [facility]   Zocor [simvastatin] Other (See Comments)   "Allergic," per [facility]        Medication List     STOP taking these  medications    traMADol 50 MG tablet Commonly known as: ULTRAM       TAKE these medications    allopurinol 100 MG tablet Commonly known as: ZYLOPRIM Take 100 mg by mouth in the morning.   amLODipine 5 MG tablet Commonly known as: NORVASC Take 1 tablet (5 mg total) by mouth daily. Start taking on: November 27, 2020   aspirin 81 MG chewable tablet Chew 81 mg by mouth in the morning.   benazepril 10 MG tablet Commonly known as: LOTENSIN Take 10 mg by mouth in the morning.   Calcium Carb-Cholecalciferol 500-400 MG-UNIT Tabs Take 2 tablets by mouth in the morning.   Cholecalciferol 50 MCG (2000 UT) Tabs Take 2,000 Units by mouth in the morning.   Co Q-10 100 MG Caps Take 200 mg by mouth in the morning.   meclizine 12.5 MG tablet Commonly known as: ANTIVERT Take 12.5 mg by mouth 2 (two) times daily as needed for dizziness.   memantine 10 MG tablet Commonly known as: NAMENDA Take 10 mg by mouth 2 (two) times daily.   Omega III EPA+DHA 1000 MG Caps Take 2,000 mg by mouth 2 (two) times daily.   Synthroid 100 MCG tablet Generic drug: levothyroxine Take 100 mcg by mouth See admin instructions. Take 100 mcg by mouth in the morning before breakfast only on Wednesdays   Synthroid 50 MCG tablet Generic drug: levothyroxine Take 50 mcg by mouth See admin instructions. Take 50 mcg by mouth in the morning before breakfast on Sun/Mon/Tues/Thurs/Fri/Sat   Tylenol 325 MG tablet Generic drug: acetaminophen Take 650 mg by mouth See admin instructions. Take 650 mg by mouth three times a day with meals and CANNOT EXCEED 3,000 MG/24 HOURS OF TYLENOL FROM ALL COMBINED SOURCES       Allergies  Allergen Reactions   Actonel [Risedronate] Other (See Comments)    "Allergic," per [facility]   Hydrochlorothiazide Other (See Comments)    "Allergic," per [facility]   Lipitor [Atorvastatin] Other (See Comments)    "Allergic," per [facility]   Neomycin Other (See Comments)    "Allergic," per [facility]    Polysporin [Bacitracin-Polymyxin B] Other (See Comments)    "Allergic," per [facility]   Prolia [Denosumab] Other (See Comments)   Sulfa Antibiotics Other (See Comments)    "Allergic," per [facility]   Zocor [Simvastatin] Other (See Comments)    "Allergic," per [facility]      The results of significant diagnostics from this hospitalization (including imaging, microbiology, ancillary and laboratory) are listed below for reference.    Significant Diagnostic Studies: DG Chest 2 View  Result Date: 11/26/2020 CLINICAL DATA:  Fall at nursing home, syncope, altered mental status, abnormal chest radiograph question RIGHT base lung mass EXAM: CHEST - 2 VIEW COMPARISON:  11/24/2020 FINDINGS: Normal heart size, mediastinal contours, and pulmonary vascularity. Atherosclerotic calcification aorta. Bronchitic changes with bibasilar atelectasis. No infiltrate, pleural effusion, or pneumothorax. Area of question nodularity on the previous exam is partially obscured by the diaphragm on the current study, suboptimally assessed. This could either represent a lung nodule or be associated with calcification within anterior costal cartilage associated with the anterior RIGHT fourth rib.  IMPRESSION: Bronchitic changes with bibasilar atelectasis. Questionable nodular density versus artifact from costal cartilaginous calcification at the anterior RIGHT fourth rib, suboptimally assessed; if clinically indicated based on patient age and comorbidities, this could be further assessed by CT. Electronically Signed   By: Lavonia Dana M.D.   On: 11/26/2020 11:50   CT HEAD WO CONTRAST  Result Date: 11/24/2020 CLINICAL DATA:  Altered mental status, fall EXAM: CT HEAD WITHOUT CONTRAST TECHNIQUE: Contiguous axial images were obtained from the base of the skull through the vertex without intravenous contrast. COMPARISON:  CT head 08/02/2020 FINDINGS: Brain: There is no evidence of acute intracranial hemorrhage, extra-axial fluid collection, or acute  infarct. A remote infarct in the left parietal lobe is unchanged. Mild parenchymal volume loss and chronic white matter microangiopathy are unchanged. The ventricles are stable in size. There is no mass lesion. There is no midline shift. Vascular: There is calcification of the bilateral cavernous ICAs. Skull: Normal. Negative for fracture or focal lesion. Sinuses/Orbits: The paranasal sinuses are clear. The globes and orbits are unremarkable. Other: None. IMPRESSION: 1. No acute intracranial pathology. 2. Unchanged remote left parietal lobe infarct, parenchymal volume loss, and chronic white matter microangiopathy. Electronically Signed   By: Valetta Mole M.D.   On: 11/24/2020 14:29   CT CERVICAL SPINE WO CONTRAST  Result Date: 11/24/2020 CLINICAL DATA:  Trauma, fall EXAM: CT CERVICAL SPINE WITHOUT CONTRAST TECHNIQUE: Multidetector CT imaging of the cervical spine was performed without intravenous contrast. Multiplanar CT image reconstructions were also generated. COMPARISON:  CT cervical spine 08/02/2020 FINDINGS: Alignment: There is straightened curvature of the cervical spine with slight focal kyphosis centered at C5, similar to the prior study. There is grade 1 anterolisthesis of C3 on C4 and C4 on C5, also unchanged and likely degenerative in nature. There is no evidence of traumatic malalignment. There is no jumped or perched facet. Skull base and vertebrae: Skull base alignment is maintained. Vertebral body heights are preserved. There is no evidence of acute fracture. Soft tissues and spinal canal: No prevertebral fluid or swelling. No visible canal hematoma. Disc levels: There is marked intervertebral disc space narrowing at C5-C6, similar to the prior study. Degenerative endplate changes also most advanced at this level. There is multilevel facet arthropathy, similar to the prior study. There is mild narrowing of the craniocervical junction. The osseous spinal canal is otherwise patent. Upper chest:  There is mosaic attenuation in the lung apices with mild smooth interlobular septal thickening. Other: The left thyroid lobe is surgically absent. The soft tissues are otherwise unremarkable. IMPRESSION: 1. No acute fracture or traumatic malalignment of the cervical spine. 2. Multilevel degenerative changes as above, similar to the prior study. 3. Smooth interlobular septal thickening in the lung apices can be seen with pulmonary interstitial edema. Mosaic attenuation in the lung apices suggests air trapping/small airway disease. Electronically Signed   By: Valetta Mole M.D.   On: 11/24/2020 14:22   MR ANGIO HEAD WO CONTRAST  Result Date: 11/25/2020 CLINICAL DATA:  TIA EXAM: MRI HEAD WITHOUT CONTRAST MRA HEAD WITHOUT CONTRAST TECHNIQUE: Multiplanar, multi-echo pulse sequences of the brain and surrounding structures were acquired without intravenous contrast. Angiographic images of the Circle of Willis were acquired using MRA technique without intravenous contrast. COMPARISON:  Head CT from yesterday FINDINGS: MRI HEAD FINDINGS Brain: No acute infarction, hemorrhage, hydrocephalus, extra-axial collection or mass lesion. Remote bilateral occipital and left parietal cortically based infarcts. Chronic small vessel ischemia in the deep white matter which is mild for  age. Nonspecific cerebral volume loss. Vascular: Major flow voids are preserved Skull and upper cervical spine: Posterior scalp swelling. No evidence of bone lesion. Sinuses/Orbits: Bilateral cataract resection.  No evidence of injury Other: Moderate motion artifact to the degree that findings could be obscured. MRA HEAD FINDINGS Significant motion artifact intermittently, especially at the level of the circle-of-Willis. The covered vertebral, basilar, and carotid arteries are patent. A1, M1, and P1 segments are largely obscured by motion artifact. No detected branch occlusion or aneurysm. IMPRESSION: Brain MRI: 1. No acute intracranial finding.  No acute  infarct. 2. Chronic small vessel disease and small remote cortical infarcts. 3. Posterior scalp contusion. 4. Moderate motion artifact Intracranial MRA: Motion artifact with multiple nondiagnostic slices. Unremarkable vessels where visible. Electronically Signed   By: Jorje Guild M.D.   On: 11/25/2020 06:39   MR BRAIN WO CONTRAST  Result Date: 11/25/2020 CLINICAL DATA:  TIA EXAM: MRI HEAD WITHOUT CONTRAST MRA HEAD WITHOUT CONTRAST TECHNIQUE: Multiplanar, multi-echo pulse sequences of the brain and surrounding structures were acquired without intravenous contrast. Angiographic images of the Circle of Willis were acquired using MRA technique without intravenous contrast. COMPARISON:  Head CT from yesterday FINDINGS: MRI HEAD FINDINGS Brain: No acute infarction, hemorrhage, hydrocephalus, extra-axial collection or mass lesion. Remote bilateral occipital and left parietal cortically based infarcts. Chronic small vessel ischemia in the deep white matter which is mild for age. Nonspecific cerebral volume loss. Vascular: Major flow voids are preserved Skull and upper cervical spine: Posterior scalp swelling. No evidence of bone lesion. Sinuses/Orbits: Bilateral cataract resection.  No evidence of injury Other: Moderate motion artifact to the degree that findings could be obscured. MRA HEAD FINDINGS Significant motion artifact intermittently, especially at the level of the circle-of-Willis. The covered vertebral, basilar, and carotid arteries are patent. A1, M1, and P1 segments are largely obscured by motion artifact. No detected branch occlusion or aneurysm. IMPRESSION: Brain MRI: 1. No acute intracranial finding.  No acute infarct. 2. Chronic small vessel disease and small remote cortical infarcts. 3. Posterior scalp contusion. 4. Moderate motion artifact Intracranial MRA: Motion artifact with multiple nondiagnostic slices. Unremarkable vessels where visible. Electronically Signed   By: Jorje Guild M.D.   On:  11/25/2020 06:39   DG Pelvis Portable  Result Date: 11/25/2020 CLINICAL DATA:  Fall at home. EXAM: PORTABLE PELVIS 1-2 VIEWS COMPARISON:  None. FINDINGS: There is no evidence of pelvic fracture or diastasis. No pelvic bone lesions are seen. Atherosclerosis and osteopenia. IMPRESSION: No acute finding Electronically Signed   By: Jorje Guild M.D.   On: 11/25/2020 04:11   DG Chest Portable 1 View  Result Date: 11/24/2020 CLINICAL DATA:  Syncope. EXAM: PORTABLE CHEST 1 VIEW COMPARISON:  Jul 15, 2019 FINDINGS: Stable cardiomegaly. The hila and mediastinum are unremarkable. No pneumothorax. No overt edema or focal infiltrate. Nodular density projected over the right base is favored to represent confluence of shadows or atelectasis. No other acute abnormalities. IMPRESSION: A nodular density projected over the right base is favored to represent confluence of shadows or atelectasis. Recommend a PA and lateral chest x-[place] before discharge. No acute abnormalities are seen. Electronically Signed   By: Dorise Bullion III M.D.   On: 11/24/2020 13:45   DG Abd Portable 1V  Result Date: 11/25/2020 CLINICAL DATA:  Fall at home EXAM: PORTABLE ABDOMEN - 1 VIEW COMPARISON:  None. FINDINGS: Normal bowel gas pattern. No concerning mass effect or calcification. Generalized osteopenia and lumbar spine degeneration. L1 and L2 wedging, chronic at L2 when  compared to a 2011 abdominal CT. Given the degree of T12-L1 spurring, L1 wedging is likely also chronic. IMPRESSION: Normal bowel gas pattern. Electronically Signed   By: Jorje Guild M.D.   On: 11/25/2020 04:11   EEG adult  Result Date: 11/24/2020 Derek Jack, MD     11/24/2020  8:40 PM Routine EEG Report SHRUTHI WACHOWIAK is a 87 y.o. female with a history of spell who is undergoing an EEG to evaluate for seizures. Report: This EEG was acquired with electrodes placed according to the International 10-20 electrode system (including Fp1, Fp2, F3, F4, C3, C4, P3, P4, O1,  O2, T3, T4, T5, T6, A1, A2, Fz, Cz, Pz). The following electrodes were missing or displaced: none. There was no waking occipital dominant rhythm. The best background was 6-7 Hz. This activity is reactive to stimulation. Sleep was identified by K complexes and sleep spindles. There was no focal slowing. There were no definitive interictal epileptiform discharges. There were no electrographic seizures identified. Photic stimulation and hyperventilation were not performed. Impression and clinical correlation: This EEG was obtained while asleep and is abnormal due to mild-to-moderate diffuse slowing. There were no electrographic seizures or definitive epileptiform abnormalities seen during this recording. Su Monks, MD [facility] [phone] If 7pm- 7am, please page neurology on call as listed in [place].    Microbiology: Recent Results (from the past 240 hour(s))  Resp Panel by RT-PCR (Flu A&B, Covid) Nasopharyngeal Swab     Status: None   Collection Time: 11/24/20  1:24 PM   Specimen: Nasopharyngeal Swab; Nasopharyngeal(NP) swabs in vial transport medium  Result Value Ref Range Status   SARS Coronavirus 2 by RT PCR NEGATIVE NEGATIVE Final    Comment: (NOTE) SARS-CoV-2 target nucleic acids are NOT DETECTED.  The SARS-CoV-2 RNA is generally detectable in upper respiratory specimens during the acute phase of infection. The lowest concentration of SARS-CoV-2 viral copies this assay can detect is 138 copies/mL. A negative result does not preclude SARS-Cov-2 infection and should not be used as the sole basis for treatment or other patient management decisions. A negative result may occur with  improper specimen collection/handling, submission of specimen other than nasopharyngeal swab, presence of viral mutation(s) within the areas targeted by this assay, and inadequate number of viral copies(<138 copies/mL). A negative result must be combined with clinical observations, patient  history, and epidemiological information. The expected result is Negative.  Fact Sheet for Patients:  EntrepreneurPulse.com.au  Fact Sheet for Healthcare Providers:  IncredibleEmployment.be  This test is no t yet approved or cleared by the [place] FDA and  has been authorized for detection and/or diagnosis of SARS-CoV-2 by FDA under an Emergency Use Authorization (EUA). This EUA will remain  in effect (meaning this test can be used) for the duration of the COVID-19 declaration under Section 564(b)(1) of the Act, 21 U.S.C.section 360bbb-3(b)(1), unless the authorization is terminated  or revoked sooner.       Influenza A by PCR NEGATIVE NEGATIVE Final   Influenza B by PCR NEGATIVE NEGATIVE Final    Comment: (NOTE) The Xpert Xpress SARS-CoV-2/FLU/RSV plus assay is intended as an aid in the diagnosis of influenza from Nasopharyngeal swab specimens and should not be used as a sole basis for treatment. Nasal washings and aspirates are unacceptable for Xpert Xpress SARS-CoV-2/FLU/RSV testing.  Fact Sheet for Patients: EntrepreneurPulse.com.au  Fact Sheet for Healthcare Providers: IncredibleEmployment.be  This test is not yet approved or cleared by the [place] and has been authorized  for detection and/or diagnosis of SARS-CoV-2 by FDA under an Emergency Use Authorization (EUA). This EUA will remain in effect (meaning this test can be used) for the duration of the COVID-19 declaration under Section 564(b)(1) of the Act, 21 U.S.C. section 360bbb-3(b)(1), unless the authorization is terminated or revoked.  Performed at [facility], [place] [address] [place], [place] [address]      Labs: Basic Metabolic Panel: Recent Labs  Lab 11/24/20 1322 11/24/20 1715 11/25/20 0955 11/26/20 0602  NA 141  --  141 140  K 3.0*  --  3.2* 3.8  CL 103  --  106 104  CO2 25  --  24 21*  GLUCOSE 107*   --  81 98  BUN 12  --  9 5*  CREATININE 1.37*  --  0.94 0.85  CALCIUM 9.1  --  9.0 9.2  MG  --  1.8  --   --    Liver Function Tests: Recent Labs  Lab 11/24/20 1322  AST 22  ALT 11  ALKPHOS 87  BILITOT 0.7  PROT 6.3*  ALBUMIN 3.4*   No results for input(s): LIPASE, AMYLASE in the last 168 hours. No results for input(s): AMMONIA in the last 168 hours. CBC: Recent Labs  Lab 11/24/20 1322  WBC 12.7*  NEUTROABS 10.4*  HGB 13.8  HCT 42.3  MCV 101.7*  PLT 249   Cardiac Enzymes: Recent Labs  Lab 11/24/20 1715  CKTOTAL 111    CBG: Recent Labs  Lab 11/25/20 1758  GLUCAP 60*       Signed:  Oswald Hillock MD.  [facility] 11/26/2020, 12:17 PM

## 2020-11-26 NOTE — Progress Notes (Signed)
   11/26/20 2202  Provider Notification  Provider Name/Title Dr Hal Hope  Date Provider Notified 11/26/20  Time Provider Notified 2203  Notification Type Page  Notification Reason Other (Comment) (pt very agitated, trying to climb out of bed, HR in the 120s t0 130s)  Provider response See new orders  Date of Provider Response 11/26/20  Time of Provider Response 2205

## 2020-11-27 LAB — RESP PANEL BY RT-PCR (FLU A&B, COVID) ARPGX2
Influenza A by PCR: NEGATIVE
Influenza B by PCR: NEGATIVE
SARS Coronavirus 2 by RT PCR: NEGATIVE

## 2020-11-27 MED ORDER — CHLORHEXIDINE GLUCONATE 0.12 % MT SOLN
OROMUCOSAL | Status: AC
  Filled 2020-11-27: qty 15

## 2020-11-27 NOTE — NC FL2 (Signed)
[place] LEVEL OF CARE SCREENING TOOL     IDENTIFICATION  Patient Name: [name] Birthdate: 12-15-1924 Sex: female Admission Date (Current Location): 11/24/2020  [facility] and [place] Number:  Herbalist and Address:  [facility], [place] [address] [place], [place] [address]      Provider Number: [id number]  Attending Physician Name and Address:  Oswald Hillock, MD  Relative Name and Phone Number:       Current Level of Care: Hospital Recommended Level of Care: [place] Prior Approval Number:    Date Approved/Denied:   PASRR Number:    Discharge Plan: SNF    Current Diagnoses: Patient Active Problem List   Diagnosis Date Noted   AKI (acute kidney injury) ([place]) 11/25/2020   Seizure-like activity ([place]) 11/24/2020   Leukocytosis 11/24/2020   Acute kidney injury superimposed on chronic kidney disease ([place]) 11/24/2020   Hypertensive urgency 11/24/2020   Dementia with behavioral disturbance ([place]) 11/24/2020   History of stroke 11/24/2020   Hypokalemia 11/24/2020   Hypothyroidism 11/24/2020   DNR (do not resuscitate) 11/24/2020    Orientation RESPIRATION BLADDER Height & Weight     Self  Normal Incontinent Weight:   Height:     BEHAVIORAL SYMPTOMS/MOOD NEUROLOGICAL BOWEL NUTRITION STATUS      Incontinent Diet (see DC summary)  AMBULATORY STATUS COMMUNICATION OF NEEDS Skin   Extensive Assist Verbally Normal                       Personal Care Assistance Level of Assistance  Bathing, Feeding, Dressing Bathing Assistance: Maximum assistance Feeding assistance: Limited assistance Dressing Assistance: Maximum assistance     Functional Limitations Info  Speech     Speech Info: Impaired (expressive aphasia)    SPECIAL CARE FACTORS FREQUENCY  PT (By licensed PT), OT (By licensed OT)     PT Frequency: 5x/wk OT Frequency: 5x/wk            Contractures Contractures Info: Not present     Additional Factors Info  Code Status, Allergies Code Status Info: DNR Allergies Info: Actonel (Risedronate), Hydrochlorothiazide, Lipitor (Atorvastatin), Neomycin, Polysporin (Bacitracin-polymyxin B), Prolia (Denosumab), Sulfa Antibiotics, Zocor (Simvastatin)           Current Medications (11/27/2020):  This is the current hospital active medication list Current Facility-Administered Medications  Medication Dose Route Frequency Provider Last Rate Last Admin   acetaminophen (TYLENOL) tablet 650 mg  650 mg Oral TID Fuller Plan A, MD   650 mg at 11/27/20 1004   allopurinol (ZYLOPRIM) tablet 100 mg  100 mg Oral q AM Tamala Julian, Rondell A, MD   100 mg at 11/27/20 1011   amLODipine (NORVASC) tablet 5 mg  5 mg Oral Daily Oswald Hillock, MD   5 mg at 11/27/20 1004   aspirin tablet 325 mg  325 mg Oral Daily Oswald Hillock, MD   325 mg at 11/27/20 1004   Followed by   Derrill Memo ON 12/17/2020] aspirin chewable tablet 81 mg  81 mg Oral Daily Darrick Meigs, Gagan S, MD       benazepril (LOTENSIN) tablet 10 mg  10 mg Oral Daily Oswald Hillock, MD   10 mg at 11/27/20 1004   chlorhexidine gluconate (MEDLINE KIT) (PERIDEX) 0.12 % solution 15 mL  15 mL Mouth Rinse BID Smith, Rondell A, MD   15 mL at 11/27/20 1002   enoxaparin (LOVENOX) injection 40 mg  40 mg  Subcutaneous Q24H Fuller Plan A, MD   40 mg at 11/26/20 2158   hydrALAZINE (APRESOLINE) tablet 25 mg  25 mg Oral Q6H PRN Oswald Hillock, MD   25 mg at 11/26/20 1105   [START ON 11/28/2020] levothyroxine (SYNTHROID) tablet 100 mcg  100 mcg Oral Once per day on Wed Smith, Rondell A, MD       levothyroxine (SYNTHROID) tablet 50 mcg  50 mcg Oral Once per day on Sun Mon Tue Thu Fri Sat Fuller Plan A, MD   50 mcg at 11/27/20 5183   loperamide (IMODIUM) capsule 2 mg  2 mg Oral PRN Oswald Hillock, MD   2 mg at 11/26/20 1535   LORazepam (ATIVAN) injection 1-2 mg  1-2 mg Intravenous Q2H PRN Fuller Plan A, MD       MEDLINE mouth rinse  15 mL Mouth Rinse 10 times per day  Fuller Plan A, MD   15 mL at 11/27/20 1315   memantine (NAMENDA) tablet 10 mg  10 mg Oral BID Fuller Plan A, MD   10 mg at 11/27/20 1005   ondansetron (ZOFRAN) injection 4 mg  4 mg Intravenous Q6H PRN Oswald Hillock, MD   4 mg at 11/27/20 4373     Discharge Medications: Please see discharge summary for a list of discharge medications.  Relevant Imaging Results:  Relevant Lab Results:   Additional Information SS#: [id number]  Geralynn Ochs, [place]

## 2020-11-27 NOTE — Progress Notes (Signed)
Physical Therapy Treatment Patient Details Name: [name] MRN: [id number] DOB: 01-20-1925 Today's Date: 11/27/2020   History of Present Illness [name] is a 87 y.o. female who presents with seizure like activity after a fall, thought to be post concussive and EEG did not show more seizure activity, MRI brain neg for acute abnormalities. PMH: hypertension, hypothyroidism, CKDIII, peripheral neuropathy, CVA, dementia, chronic back pain with multiple compression fractures (T5, T8, L3), and gout.    PT Comments    Pt received in supine, lethargic and with difficulty maintaining eyes open and delayed to poor command following. Pt needing multimodal cues for sequencing tasks and increased time to perform with max cues, but once EOB remains very lethargic. Pt HR/SpO2 [facility] today. Pt needing max to totalA for bed mobility and standing attempt and too lethargic to attempt OOB transfers today. Pt continues to benefit from PT services to progress toward functional mobility goals. Continue to recommend SNF.   Recommendations for follow up therapy are one component of a multi-disciplinary discharge planning process, led by the attending physician.  Recommendations may be updated based on patient status, additional functional criteria and insurance authorization.  Follow Up Recommendations  SNF;Supervision/Assistance - 24 hour     Equipment Recommendations  None recommended by PT (TBD pending progress)    Recommendations for Other Services       Precautions / Restrictions Precautions Precautions: Fall;Other (comment) Precaution Comments: seizure Restrictions Weight Bearing Restrictions: No     Mobility  Bed Mobility Overal bed mobility: Needs Assistance Bed Mobility: Sidelying to Sit;Rolling;Sit to Sidelying Rolling: Total assist Sidelying to sit: Max assist     Sit to sidelying: Max assist General bed mobility comments: via log roll to L EOB, pt needs max multimodal cues and  increased time to perform, pt tending to keep eyes closed throughout    Transfers Overall transfer level: Needs assistance Equipment used: 1 person hand held assist Transfers: Sit to/from Stand Sit to Stand: Max assist         General transfer comment: pt performed partial stand at EOB with face to face posture but too lethargic to stand fully upright; deferred OOB transfers for safety due to lethargy/poor command following  Ambulation/Gait                 Stairs             Wheelchair Mobility    Modified Rankin (Stroke Patients Only)       Balance Overall balance assessment: Needs assistance;History of Falls Sitting-balance support: Feet supported;Bilateral upper extremity supported Sitting balance-Leahy Scale: Poor Sitting balance - Comments: min to modA to maintain sitting EOB due to posterior lean Postural control: Posterior lean Standing balance support: Bilateral upper extremity supported;During functional activity Standing balance-Leahy Scale: Zero Standing balance comment: unable to reach full upright posture, posterior lean/poor effort 2/2 lethargy                            Cognition Arousal/Alertness: Lethargic;Suspect due to medications Behavior During Therapy:  (pt maintains eyes closed; drowsy) Overall Cognitive Status: No family/caregiver present to determine baseline cognitive functioning Area of Impairment: Orientation;Following commands;Attention;Awareness                 Orientation Level: Disoriented to;Place;Time;Situation;Person Current Attention Level: Focused   Following Commands: Follows one step commands inconsistently   Awareness: Intellectual   General Comments: pt received in supine/lethargic, will briefly open eyes to  command but does not maintain open eyes even with max cues; pt non-verbal today.      Exercises      General Comments General comments (skin integrity, edema, etc.): HR 78-92 bpm with  exertion, SpO2 94% on RA      Pertinent Vitals/Pain Pain Assessment: Faces Faces Pain Scale: Hurts a little bit Pain Location: pt unable to verbalize Pain Descriptors / Indicators: Grimacing Pain Intervention(s): Limited activity within patient's tolerance;Monitored during session;Repositioned    Home Living                      Prior Function            PT Goals (current goals can now be found in the care plan section) Acute Rehab PT Goals Patient Stated Goal: none stated PT Goal Formulation: Patient unable to participate in goal setting Time For Goal Achievement: 12/10/20 Potential to Achieve Goals: Fair Progress towards PT goals: Progressing toward goals    Frequency    Min 3X/week      PT Plan Current plan remains appropriate    Co-evaluation              AM-PAC PT "6 Clicks" Mobility   Outcome Measure  Help needed turning from your back to your side while in a flat bed without using bedrails?: Total Help needed moving from lying on your back to sitting on the side of a flat bed without using bedrails?: A Lot Help needed moving to and from a bed to a chair (including a wheelchair)?: Total Help needed standing up from a chair using your arms (e.g., wheelchair or bedside chair)?: Total Help needed to walk in hospital room?: Total Help needed climbing 3-5 steps with a railing? : Total 6 Click Score: 7    End of Session Equipment Utilized During Treatment: Gait belt Activity Tolerance: Patient tolerated treatment well Patient left: with bed alarm set;with nursing/sitter in room;with call bell/phone within reach;in bed;Other (comment) (4 rails up (with seizure pads on) and mitts on due to seizure precs) Nurse Communication: Mobility status PT Visit Diagnosis: Muscle weakness (generalized) (M62.81);Pain;Difficulty in walking, not elsewhere classified (R26.2);Unsteadiness on feet (R26.81)     Time: 5056-9794 PT Time Calculation (min) (ACUTE ONLY):  19 min  Charges:  $Therapeutic Activity: 8-22 mins                     Taela Charbonneau P., PTA Acute Rehabilitation Services Pager: [phone] Office: [place] 11/27/2020, 2:57 PM

## 2020-11-27 NOTE — TOC Transition Note (Signed)
Transition of Care [facility]) - CM/SW Discharge Note   Patient Details  Name: [name] MRN: [id number] Date of Birth: 1924/04/04  [facility] Care [facility]) CM/SW Contact:  Geralynn Ochs, LCSW Phone Number: 11/27/2020, 3:33 PM   Clinical Narrative:   Nurse to call report to [phone], [facility] Room 67.    Final next level of care: Skilled Nursing Facility Barriers to Discharge: Barriers Resolved   Patient Goals and CMS Choice Patient states their goals for this hospitalization and ongoing recovery are:: patient unable to participate in goal setting, only oriented to self CMS Medicare.gov Compare Post Acute Care list provided to:: Patient Represenative (must comment) Choice offered to / list presented to : Adult Children  Discharge Placement              Patient chooses bed at: [facility] Patient to be transferred to facility by: [facility] Name of family member notified: Clair Gulling Patient and family notified of of transfer: 11/27/20  Discharge Plan and Services                                     Social Determinants of Health (SDOH) Interventions     Readmission Risk Interventions No flowsheet data found.

## 2020-11-28 ENCOUNTER — Non-Acute Institutional Stay (SKILLED_NURSING_FACILITY): Payer: Medicare Other | Admitting: Orthopedic Surgery

## 2020-11-28 DIAGNOSIS — N1831 Chronic kidney disease, stage 3a: Secondary | ICD-10-CM

## 2020-11-28 DIAGNOSIS — Z8673 Personal history of transient ischemic attack (TIA), and cerebral infarction without residual deficits: Secondary | ICD-10-CM | POA: Diagnosis not present

## 2020-11-28 DIAGNOSIS — R2681 Unsteadiness on feet: Secondary | ICD-10-CM | POA: Diagnosis not present

## 2020-11-28 DIAGNOSIS — G459 Transient cerebral ischemic attack, unspecified: Secondary | ICD-10-CM | POA: Diagnosis not present

## 2020-11-28 DIAGNOSIS — R9389 Abnormal findings on diagnostic imaging of other specified body structures: Secondary | ICD-10-CM

## 2020-11-28 DIAGNOSIS — R531 Weakness: Secondary | ICD-10-CM | POA: Diagnosis not present

## 2020-11-28 DIAGNOSIS — M6281 Muscle weakness (generalized): Secondary | ICD-10-CM | POA: Diagnosis not present

## 2020-11-28 DIAGNOSIS — G8929 Other chronic pain: Secondary | ICD-10-CM | POA: Diagnosis not present

## 2020-11-28 DIAGNOSIS — M549 Dorsalgia, unspecified: Secondary | ICD-10-CM | POA: Diagnosis not present

## 2020-11-28 DIAGNOSIS — R569 Unspecified convulsions: Secondary | ICD-10-CM | POA: Diagnosis not present

## 2020-11-28 DIAGNOSIS — E039 Hypothyroidism, unspecified: Secondary | ICD-10-CM

## 2020-11-28 DIAGNOSIS — R296 Repeated falls: Secondary | ICD-10-CM | POA: Diagnosis not present

## 2020-11-28 DIAGNOSIS — M109 Gout, unspecified: Secondary | ICD-10-CM | POA: Diagnosis not present

## 2020-11-28 DIAGNOSIS — S22000A Wedge compression fracture of unspecified thoracic vertebra, initial encounter for closed fracture: Secondary | ICD-10-CM | POA: Diagnosis not present

## 2020-11-28 DIAGNOSIS — I10 Essential (primary) hypertension: Secondary | ICD-10-CM

## 2020-11-28 DIAGNOSIS — Z743 Need for continuous supervision: Secondary | ICD-10-CM | POA: Diagnosis not present

## 2020-11-28 DIAGNOSIS — R561 Post traumatic seizures: Secondary | ICD-10-CM | POA: Diagnosis not present

## 2020-11-28 DIAGNOSIS — Z9181 History of falling: Secondary | ICD-10-CM | POA: Diagnosis not present

## 2020-11-28 DIAGNOSIS — R42 Dizziness and giddiness: Secondary | ICD-10-CM | POA: Diagnosis not present

## 2020-11-28 DIAGNOSIS — R29898 Other symptoms and signs involving the musculoskeletal system: Secondary | ICD-10-CM | POA: Diagnosis not present

## 2020-11-28 DIAGNOSIS — N1832 Chronic kidney disease, stage 3b: Secondary | ICD-10-CM | POA: Diagnosis not present

## 2020-11-28 DIAGNOSIS — F0150 Vascular dementia without behavioral disturbance: Secondary | ICD-10-CM

## 2020-11-28 DIAGNOSIS — R5381 Other malaise: Secondary | ICD-10-CM | POA: Diagnosis not present

## 2020-11-28 DIAGNOSIS — M5030 Other cervical disc degeneration, unspecified cervical region: Secondary | ICD-10-CM | POA: Diagnosis not present

## 2020-11-28 DIAGNOSIS — F01B0 Vascular dementia, moderate, without behavioral disturbance, psychotic disturbance, mood disturbance, and anxiety: Secondary | ICD-10-CM | POA: Diagnosis not present

## 2020-11-28 NOTE — Progress Notes (Signed)
Pt earlier discharged, waiting on [facility] for transport to [place] came and got pt at 0500, pt left with belongings at bedside, was however reassured. [place], [name]

## 2020-11-29 ENCOUNTER — Encounter: Payer: Self-pay | Admitting: Orthopedic Surgery

## 2020-11-29 ENCOUNTER — Encounter: Payer: Self-pay | Admitting: Nurse Practitioner

## 2020-11-29 NOTE — Progress Notes (Signed)
Location:  [facility] Room Number: N40 Place of Service:  SNF [phone]) Provider:  Windell Moulding, AGNP-C  Virgie Dad, MD  Patient Care Team: Virgie Dad, MD as PCP - General (Internal Medicine) Mast, Man X, NP as Nurse Practitioner (Internal Medicine) Virgie Dad, MD as Consulting Physician (Internal Medicine) Virgie Dad, MD (Internal Medicine)  Extended Emergency Contact Information Primary Emergency Contact: Jeannine Kitten of [place] Phone: [phone] Relation: Son Secondary Emergency Contact: Askari,[name] [place] Phone: [phone] Relation: Son  Code Status: DNR Goals of care: Advanced Directive information Advanced Directives 11/24/2020  Does Patient Have a Medical Advance Directive? Yes  Type of Advance Directive -  Does patient want to make changes to medical advance directive? -  Copy of [place] in Chart? -  Would patient like information on creating a medical advance directive? -  Pre-existing out of facility DNR order (yellow form or pink MOST form) -     Chief Complaint  Patient presents with   Hospitalization Follow-up    HPI:  Pt is a 87 y.o. female seen today for f/u s/p hospitalization at [facility] 09/17-09/21.   Presented to the hospital with seizure- like activity. She stood up and fell backwards hitting her head. After fall she was lethargic and side of mouth of bleeding, appears she bit her tongue. Neurology was consulted, EEG unremarkable, MRI brain revealed no acute intracranial findings. Anti seizure medication and tramadol held per neuro. Creatine elevated to 1.37 upon admission, she was given IV fluids and creatine improved to 0.85. Blood pressure initially elevated to 184/107, she was given home benazepril and started on amlodipine. No behavioral disturbances during hospitalization, advised to continue namenda. Potassium replaced during stay, K+ 3.8 at discharged. TSH 2.34, advised  to continue levothyroxine. Abnormal CXR revealed nodular density over right base, PA and lateral views did not show density, CT chest recommended outpatient.   No recent falls, injuries or behavioral outbursts.   Today, she is alert to self, appears tired. She does not follow commands or answer any of my questions. She stated " I hate pimento cheese." When her lunch was delivered.    Past Medical History:  Diagnosis Date   Cognitive changes    Dementia ([place])    Depression    Gout    Hypertension    Hypothyroidism    Peripheral neuropathy    No past surgical history on file.  Allergies  Allergen Reactions   Actonel [Risedronate Sodium]    Actonel [Risedronate] Other (See Comments)    "Allergic," per [facility]   Hct [Hydrochlorothiazide]    Hydrochlorothiazide Other (See Comments)    "Allergic," per [facility]   Lipitor [Atorvastatin Calcium]    Lipitor [Atorvastatin] Other (See Comments)    "Allergic," per [facility]   Neomycin Other (See Comments)    Unknown "been so long ago, a Dermatologist told me"   Neomycin Other (See Comments)    "Allergic," per [facility]   Polysporin [Bacitracin-Polymyxin B]    Polysporin [Bacitracin-Polymyxin B] Other (See Comments)    "Allergic," per [facility]   Prolia [Denosumab]    Prolia [Denosumab] Other (See Comments)   Sulfa Antibiotics    Sulfa Antibiotics Other (See Comments)    "Allergic," per [facility]   Zocor [Simvastatin]    Zocor [Simvastatin] Other (See Comments)    "Allergic," per [facility]    Outpatient Encounter Medications as of 11/28/2020  Medication Sig   acetaminophen (TYLENOL) 325 MG tablet  Take 650 mg by mouth in the morning, at noon, and at bedtime.   allopurinol (ZYLOPRIM) 100 MG tablet Take 100 mg by mouth daily.   allopurinol (ZYLOPRIM) 100 MG tablet Take 100 mg by mouth in the morning.   amLODipine (NORVASC) 5 MG tablet Take 1 tablet (5 mg total) by mouth daily.   aspirin 81 MG chewable tablet Chew 81 mg by mouth daily.   aspirin 81 MG chewable tablet  Chew 81 mg by mouth in the morning.   benazepril (LOTENSIN) 10 MG tablet Take 10 mg by mouth daily.   benazepril (LOTENSIN) 10 MG tablet Take 10 mg by mouth in the morning.   Calcium Carb-Cholecalciferol 500-400 MG-UNIT TABS Take 2 tablets by mouth daily.    Calcium Carb-Cholecalciferol 500-400 MG-UNIT TABS Take 2 tablets by mouth in the morning.   cholecalciferol (VITAMIN D3) 25 MCG (1000 UNIT) tablet Take 1,000 Units by mouth 2 (two) times daily.   Cholecalciferol 50 MCG (2000 UT) TABS Take 2,000 Units by mouth in the morning.   Coenzyme Q10 (CO Q-10) 100 MG CAPS Take 200 mg by mouth in the morning.   Coenzyme Q10 (COQ10) 100 MG CAPS Take 2 capsules by mouth daily.   levothyroxine (SYNTHROID) 100 MCG tablet Take 100 mcg by mouth once a week. On Wednesday   levothyroxine (SYNTHROID) 50 MCG tablet Take 50 mcg by mouth daily before breakfast. Sun,Mon,Tue, Thur,Fri,Sat   meclizine (ANTIVERT) 12.5 MG tablet Take 12.5 mg by mouth 2 (two) times daily as needed for dizziness.   memantine (NAMENDA) 10 MG tablet Take 10 mg by mouth 2 (two) times daily.   memantine (NAMENDA) 10 MG tablet Take 10 mg by mouth 2 (two) times daily.   Omega-3 1000 MG CAPS Take 2 capsules by mouth 2 (two) times daily.    Omega-3 Fatty Acids (OMEGA III EPA+DHA) 1000 MG CAPS Take 2,000 mg by mouth 2 (two) times daily.   SYNTHROID 100 MCG tablet Take 100 mcg by mouth See admin instructions. Take 100 mcg by mouth in the morning before breakfast only on Wednesdays   SYNTHROID 50 MCG tablet Take 50 mcg by mouth See admin instructions. Take 50 mcg by mouth in the morning before breakfast on Sun/Mon/Tues/Thurs/Fri/Sat   traMADol (ULTRAM) 50 MG tablet Take 1 tablet (50 mg total) by mouth 3 (three) times daily with meals.   TYLENOL 325 MG tablet Take 650 mg by mouth See admin instructions. Take 650 mg by mouth three times a day with meals and CANNOT EXCEED 3,000 MG/24 HOURS OF TYLENOL FROM ALL COMBINED SOURCES   No facility-administered  encounter medications on file as of 11/28/2020.    Review of Systems  Unable to perform ROS: Dementia   Immunization History  Administered Date(s) Administered   Influenza, High Dose Seasonal PF 12/11/2018   Influenza-Unspecified 03/24/2018, 12/21/2019   Moderna Sars-Covid-2 Vaccination 03/15/2019, 04/21/2019, 01/17/2020, 08/07/2020   Tdap 07/15/2019   Pertinent  Health Maintenance Due  Topic Date Due   DEXA SCAN  Never done   INFLUENZA VACCINE  10/08/2020   No flowsheet data found. Functional Status Survey:    Vitals:   11/29/20 1341  BP: 122/76  Pulse: 88  Resp: 19  Temp: 98.8 F (37.1 C)  SpO2: 96%  Weight: 139 lb (63 kg)  Height: 5\' 4"  (1.626 m)   Body mass index is 23.86 kg/m. Physical Exam Vitals reviewed.  Constitutional:      General: She is not in acute distress. HENT:  Head: Normocephalic.     Right Ear: There is no impacted cerumen.     Left Ear: There is no impacted cerumen.     Nose: Nose normal.     Mouth/Throat:     Mouth: Mucous membranes are moist.  Eyes:     General:        Right eye: No discharge.        Left eye: No discharge.  Neck:     Vascular: No carotid bruit.  Cardiovascular:     Rate and Rhythm: Normal rate and regular rhythm.     Pulses: Normal pulses.     Heart sounds: Normal heart sounds.  Pulmonary:     Effort: Pulmonary effort is normal. No respiratory distress.     Breath sounds: Normal breath sounds. No wheezing.  Abdominal:     General: Bowel sounds are normal. There is no distension.     Palpations: Abdomen is soft.     Tenderness: There is no abdominal tenderness.  Musculoskeletal:     Cervical back: Normal range of motion.     Right lower leg: No edema.     Left lower leg: No edema.  Lymphadenopathy:     Cervical: No cervical adenopathy.  Skin:    General: Skin is warm and dry.     Capillary Refill: Capillary refill takes less than 2 seconds.  Neurological:     General: No focal deficit present.      Mental Status: She is alert. Mental status is at baseline.     Motor: Weakness present.     Gait: Gait abnormal.  Psychiatric:        Mood and Affect: Mood normal.        Behavior: Behavior normal.    Labs reviewed: Recent Labs    11/24/20 1322 11/24/20 1715 11/25/20 0955 11/26/20 0602  NA 141  --  141 140  K 3.0*  --  3.2* 3.8  CL 103  --  106 104  CO2 25  --  24 21*  GLUCOSE 107*  --  81 98  BUN 12  --  9 5*  CREATININE 1.37*  --  0.94 0.85  CALCIUM 9.1  --  9.0 9.2  MG  --  1.8  --   --    Recent Labs    09/25/20 0000 10/22/20 0000 11/24/20 1322  AST 15 18 22   ALT 11 11 11   ALKPHOS 101 105 87  BILITOT  --   --  0.7  PROT  --   --  6.3*  ALBUMIN 3.8 4.1 3.4*   Recent Labs    09/25/20 0000 10/22/20 0000 11/24/20 1322  WBC 8.6 8.7 12.7*  NEUTROABS 6,364.00 7,230.00 10.4*  HGB 13.1 15.2 13.8  HCT 40 46 42.3  MCV  --   --  101.7*  PLT 264 274 249   Lab Results  Component Value Date   TSH 2.34 06/22/2020   No results found for: HGBA1C No results found for: CHOL, HDL, LDLCALC, LDLDIRECT, TRIG, CHOLHDL  Significant Diagnostic Results in last 30 days:  DG Chest 2 View  Result Date: 11/26/2020 CLINICAL DATA:  Fall at nursing home, syncope, altered mental status, abnormal chest radiograph question RIGHT base lung mass EXAM: CHEST - 2 VIEW COMPARISON:  11/24/2020 FINDINGS: Normal heart size, mediastinal contours, and pulmonary vascularity. Atherosclerotic calcification aorta. Bronchitic changes with bibasilar atelectasis. No infiltrate, pleural effusion, or pneumothorax. Area of question nodularity on the previous exam is partially obscured  by the diaphragm on the current study, suboptimally assessed. This could either represent a lung nodule or be associated with calcification within anterior costal cartilage associated with the anterior RIGHT fourth rib. IMPRESSION: Bronchitic changes with bibasilar atelectasis. Questionable nodular density versus artifact from  costal cartilaginous calcification at the anterior RIGHT fourth rib, suboptimally assessed; if clinically indicated based on patient age and comorbidities, this could be further assessed by CT. Electronically Signed   By: Lavonia Dana M.D.   On: 11/26/2020 11:50   CT HEAD WO CONTRAST  Result Date: 11/24/2020 CLINICAL DATA:  Altered mental status, fall EXAM: CT HEAD WITHOUT CONTRAST TECHNIQUE: Contiguous axial images were obtained from the base of the skull through the vertex without intravenous contrast. COMPARISON:  CT head 08/02/2020 FINDINGS: Brain: There is no evidence of acute intracranial hemorrhage, extra-axial fluid collection, or acute infarct. A remote infarct in the left parietal lobe is unchanged. Mild parenchymal volume loss and chronic white matter microangiopathy are unchanged. The ventricles are stable in size. There is no mass lesion. There is no midline shift. Vascular: There is calcification of the bilateral cavernous ICAs. Skull: Normal. Negative for fracture or focal lesion. Sinuses/Orbits: The paranasal sinuses are clear. The globes and orbits are unremarkable. Other: None. IMPRESSION: 1. No acute intracranial pathology. 2. Unchanged remote left parietal lobe infarct, parenchymal volume loss, and chronic white matter microangiopathy. Electronically Signed   By: Valetta Mole M.D.   On: 11/24/2020 14:29   CT CERVICAL SPINE WO CONTRAST  Result Date: 11/24/2020 CLINICAL DATA:  Trauma, fall EXAM: CT CERVICAL SPINE WITHOUT CONTRAST TECHNIQUE: Multidetector CT imaging of the cervical spine was performed without intravenous contrast. Multiplanar CT image reconstructions were also generated. COMPARISON:  CT cervical spine 08/02/2020 FINDINGS: Alignment: There is straightened curvature of the cervical spine with slight focal kyphosis centered at C5, similar to the prior study. There is grade 1 anterolisthesis of C3 on C4 and C4 on C5, also unchanged and likely degenerative in nature. There is no  evidence of traumatic malalignment. There is no jumped or perched facet. Skull base and vertebrae: Skull base alignment is maintained. Vertebral body heights are preserved. There is no evidence of acute fracture. Soft tissues and spinal canal: No prevertebral fluid or swelling. No visible canal hematoma. Disc levels: There is marked intervertebral disc space narrowing at C5-C6, similar to the prior study. Degenerative endplate changes also most advanced at this level. There is multilevel facet arthropathy, similar to the prior study. There is mild narrowing of the craniocervical junction. The osseous spinal canal is otherwise patent. Upper chest: There is mosaic attenuation in the lung apices with mild smooth interlobular septal thickening. Other: The left thyroid lobe is surgically absent. The soft tissues are otherwise unremarkable. IMPRESSION: 1. No acute fracture or traumatic malalignment of the cervical spine. 2. Multilevel degenerative changes as above, similar to the prior study. 3. Smooth interlobular septal thickening in the lung apices can be seen with pulmonary interstitial edema. Mosaic attenuation in the lung apices suggests air trapping/small airway disease. Electronically Signed   By: Valetta Mole M.D.   On: 11/24/2020 14:22   MR ANGIO HEAD WO CONTRAST  Result Date: 11/25/2020 CLINICAL DATA:  TIA EXAM: MRI HEAD WITHOUT CONTRAST MRA HEAD WITHOUT CONTRAST TECHNIQUE: Multiplanar, multi-echo pulse sequences of the brain and surrounding structures were acquired without intravenous contrast. Angiographic images of the Circle of Willis were acquired using MRA technique without intravenous contrast. COMPARISON:  Head CT from yesterday FINDINGS: MRI HEAD FINDINGS Brain:  No acute infarction, hemorrhage, hydrocephalus, extra-axial collection or mass lesion. Remote bilateral occipital and left parietal cortically based infarcts. Chronic small vessel ischemia in the deep white matter which is mild for age.  Nonspecific cerebral volume loss. Vascular: Major flow voids are preserved Skull and upper cervical spine: Posterior scalp swelling. No evidence of bone lesion. Sinuses/Orbits: Bilateral cataract resection.  No evidence of injury Other: Moderate motion artifact to the degree that findings could be obscured. MRA HEAD FINDINGS Significant motion artifact intermittently, especially at the level of the circle-of-Willis. The covered vertebral, basilar, and carotid arteries are patent. A1, M1, and P1 segments are largely obscured by motion artifact. No detected branch occlusion or aneurysm. IMPRESSION: Brain MRI: 1. No acute intracranial finding.  No acute infarct. 2. Chronic small vessel disease and small remote cortical infarcts. 3. Posterior scalp contusion. 4. Moderate motion artifact Intracranial MRA: Motion artifact with multiple nondiagnostic slices. Unremarkable vessels where visible. Electronically Signed   By: Jorje Guild M.D.   On: 11/25/2020 06:39   MR BRAIN WO CONTRAST  Result Date: 11/25/2020 CLINICAL DATA:  TIA EXAM: MRI HEAD WITHOUT CONTRAST MRA HEAD WITHOUT CONTRAST TECHNIQUE: Multiplanar, multi-echo pulse sequences of the brain and surrounding structures were acquired without intravenous contrast. Angiographic images of the Circle of Willis were acquired using MRA technique without intravenous contrast. COMPARISON:  Head CT from yesterday FINDINGS: MRI HEAD FINDINGS Brain: No acute infarction, hemorrhage, hydrocephalus, extra-axial collection or mass lesion. Remote bilateral occipital and left parietal cortically based infarcts. Chronic small vessel ischemia in the deep white matter which is mild for age. Nonspecific cerebral volume loss. Vascular: Major flow voids are preserved Skull and upper cervical spine: Posterior scalp swelling. No evidence of bone lesion. Sinuses/Orbits: Bilateral cataract resection.  No evidence of injury Other: Moderate motion artifact to the degree that findings could  be obscured. MRA HEAD FINDINGS Significant motion artifact intermittently, especially at the level of the circle-of-Willis. The covered vertebral, basilar, and carotid arteries are patent. A1, M1, and P1 segments are largely obscured by motion artifact. No detected branch occlusion or aneurysm. IMPRESSION: Brain MRI: 1. No acute intracranial finding.  No acute infarct. 2. Chronic small vessel disease and small remote cortical infarcts. 3. Posterior scalp contusion. 4. Moderate motion artifact Intracranial MRA: Motion artifact with multiple nondiagnostic slices. Unremarkable vessels where visible. Electronically Signed   By: Jorje Guild M.D.   On: 11/25/2020 06:39   DG Pelvis Portable  Result Date: 11/25/2020 CLINICAL DATA:  Fall at home. EXAM: PORTABLE PELVIS 1-2 VIEWS COMPARISON:  None. FINDINGS: There is no evidence of pelvic fracture or diastasis. No pelvic bone lesions are seen. Atherosclerosis and osteopenia. IMPRESSION: No acute finding Electronically Signed   By: Jorje Guild M.D.   On: 11/25/2020 04:11   DG Chest Portable 1 View  Result Date: 11/24/2020 CLINICAL DATA:  Syncope. EXAM: PORTABLE CHEST 1 VIEW COMPARISON:  Jul 15, 2019 FINDINGS: Stable cardiomegaly. The hila and mediastinum are unremarkable. No pneumothorax. No overt edema or focal infiltrate. Nodular density projected over the right base is favored to represent confluence of shadows or atelectasis. No other acute abnormalities. IMPRESSION: A nodular density projected over the right base is favored to represent confluence of shadows or atelectasis. Recommend a PA and lateral chest x-[place] before discharge. No acute abnormalities are seen. Electronically Signed   By: Dorise Bullion III M.D.   On: 11/24/2020 13:45   DG Abd Portable 1V  Result Date: 11/25/2020 CLINICAL DATA:  Fall at home EXAM: PORTABLE  ABDOMEN - 1 VIEW COMPARISON:  None. FINDINGS: Normal bowel gas pattern. No concerning mass effect or calcification. Generalized  osteopenia and lumbar spine degeneration. L1 and L2 wedging, chronic at L2 when compared to a 2011 abdominal CT. Given the degree of T12-L1 spurring, L1 wedging is likely also chronic. IMPRESSION: Normal bowel gas pattern. Electronically Signed   By: Jorje Guild M.D.   On: 11/25/2020 04:11   EEG adult  Result Date: 11/24/2020 Derek Jack, MD     11/24/2020  8:40 PM Routine EEG Report TERRACE CHIEM is a 87 y.o. female with a history of spell who is undergoing an EEG to evaluate for seizures. Report: This EEG was acquired with electrodes placed according to the International 10-20 electrode system (including Fp1, Fp2, F3, F4, C3, C4, P3, P4, O1, O2, T3, T4, T5, T6, A1, A2, Fz, Cz, Pz). The following electrodes were missing or displaced: none. There was no waking occipital dominant rhythm. The best background was 6-7 Hz. This activity is reactive to stimulation. Sleep was identified by K complexes and sleep spindles. There was no focal slowing. There were no definitive interictal epileptiform discharges. There were no electrographic seizures identified. Photic stimulation and hyperventilation were not performed. Impression and clinical correlation: This EEG was obtained while asleep and is abnormal due to mild-to-moderate diffuse slowing. There were no electrographic seizures or definitive epileptiform abnormalities seen during this recording. Su Monks, MD [facility] [phone] If 7pm- 7am, please page neurology on call as listed in [place].    Assessment/Plan 1. Seizure-like activity ([place]) - EEG unremarkable - MRI brain no acute intracranial findings - tramadol discontinued per neurology  2. Primary hypertension - controlled - cont benzapril  3. Vascular dementia without behavioral disturbance (HCC) - no recent behavioral outbursts - MMSE 18/30 2021 - MRI brain- chronic small vessel disease and small remote cortical infarcts - cont skilled nursing care  4. Stage 3a  chronic kidney disease (HCC) - elevated creat 1.37- fluid responsive - creat at discharge 0.85  5. Hypothyroidism, unspecified type - TSH 2.34 - cont levothyroxine  6. Abnormal chest x-[place] - CXR with nodular density over right base  - f/u CT recommended outpatient- will discuss with Dr. Satira Sark    Family/ staff Communication: plan discussed with nurse  Labs/tests ordered:  none

## 2020-12-03 ENCOUNTER — Encounter: Payer: Self-pay | Admitting: Internal Medicine

## 2020-12-03 ENCOUNTER — Non-Acute Institutional Stay (SKILLED_NURSING_FACILITY): Payer: Medicare Other | Admitting: Internal Medicine

## 2020-12-03 DIAGNOSIS — R9389 Abnormal findings on diagnostic imaging of other specified body structures: Secondary | ICD-10-CM

## 2020-12-03 DIAGNOSIS — R2681 Unsteadiness on feet: Secondary | ICD-10-CM

## 2020-12-03 DIAGNOSIS — N1831 Chronic kidney disease, stage 3a: Secondary | ICD-10-CM

## 2020-12-03 DIAGNOSIS — I10 Essential (primary) hypertension: Secondary | ICD-10-CM

## 2020-12-03 DIAGNOSIS — G459 Transient cerebral ischemic attack, unspecified: Secondary | ICD-10-CM | POA: Diagnosis not present

## 2020-12-03 DIAGNOSIS — Z8673 Personal history of transient ischemic attack (TIA), and cerebral infarction without residual deficits: Secondary | ICD-10-CM | POA: Diagnosis not present

## 2020-12-03 DIAGNOSIS — S22000A Wedge compression fracture of unspecified thoracic vertebra, initial encounter for closed fracture: Secondary | ICD-10-CM

## 2020-12-03 DIAGNOSIS — R569 Unspecified convulsions: Secondary | ICD-10-CM | POA: Diagnosis not present

## 2020-12-03 DIAGNOSIS — M109 Gout, unspecified: Secondary | ICD-10-CM

## 2020-12-03 DIAGNOSIS — F0150 Vascular dementia without behavioral disturbance: Secondary | ICD-10-CM | POA: Diagnosis not present

## 2020-12-03 DIAGNOSIS — E039 Hypothyroidism, unspecified: Secondary | ICD-10-CM | POA: Diagnosis not present

## 2020-12-03 DIAGNOSIS — R42 Dizziness and giddiness: Secondary | ICD-10-CM | POA: Diagnosis not present

## 2020-12-03 NOTE — Progress Notes (Signed)
Provider:  Veleta Miners MD Location:   [facility] Room Number: 31 Place of Service:  SNF (31)  PCP: Virgie Dad, MD Patient Care Team: Virgie Dad, MD as PCP - General (Internal Medicine) Mast, Man X, NP as Nurse Practitioner (Internal Medicine) Virgie Dad, MD as Consulting Physician (Internal Medicine) Virgie Dad, MD (Internal Medicine)  Extended Emergency Contact Information Primary Emergency Contact: Jeannine Kitten of [place] Phone: [phone] Relation: Son Secondary Emergency Contact: Bessler,[name] [place] Phone: [phone] Relation: Son  Code Status: DNR Goals of Care: Advanced Directive information Advanced Directives 12/03/2020  Does Patient Have a Medical Advance Directive? Yes  Type of Advance Directive Out of facility DNR (pink MOST or yellow form)  Does patient want to make changes to medical advance directive? No - Patient declined  Copy of [place] in Chart? -  Would patient like information on creating a medical advance directive? -  Pre-existing out of facility DNR order (yellow form or pink MOST form) Pink Most/Yellow Form available - Physician notified to receive inpatient order      Chief Complaint  Patient presents with   New Admit To SNF    Admission to SNF    HPI: Patient is a 87 y.o. female seen today for admission to Therapy and Long term Care  Admitted to the hospital 9/17-9/21 for Seizure Like activity  Patient has a history of hypertension, gout, hypothyroidism and peripheral neuropathy and Osteoporosis  Cognitive impairment due to Vascular Dementia H/o Recurent Falls with CT scan of had showed new remote infarcts in Left Parietal area and Periventricular area H/o TIA Lumbar Compression Fracture at L3 and at T 5 and T 8 Cervical spine Narrowing  She was transferred to AL at her request as she was doing well with her walker and able to do her ADLS But per nurses  she fell and hit her head and Nurses went to get some help for her and observed her to have Seizure Her EEG in the hoispital was negative for anything Acute MRI showed Old infarcts No New Findings Neurology decided to hold Antiseizure Meds  They think it was post concussion Seizure Also Tramadol was changed   Patient is back in SNF Working with Therapy Is not able to get up from her Wheelchair witout assistant Her goal still is to go back to her AL room No New Issues today Denies headache or any other discomfort  Past Medical History:  Diagnosis Date   Cognitive changes    Dementia ([place])    Depression    Gout    Hypertension    Hypothyroidism    Peripheral neuropathy    History reviewed. No pertinent surgical history.  reports that she has an unknown smoking status. She has never used smokeless tobacco. She reports that she does not drink alcohol and does not use drugs. Social History   Socioeconomic History   Marital status: Widowed    Spouse name: Not on file   Number of children: Not on file   Years of education: Not on file   Highest education level: Not on file  Occupational History   Not on file  Tobacco Use   Smoking status: Unknown   Smokeless tobacco: Never  Substance and Sexual Activity   Alcohol use: Never   Drug use: Never   Sexual activity: Not on file  Other Topics Concern   Not on file  Social History Narrative   **  Merged History Encounter **       Social Determinants of Health   Financial Resource Strain: Not on file  Food Insecurity: Not on file  Transportation Needs: Not on file  Physical Activity: Not on file  Stress: Not on file  Social Connections: Not on file  Intimate Partner Violence: Not on file    Functional Status Survey:    History reviewed. No pertinent family history.  Health Maintenance  Topic Date Due   Zoster Vaccines- Shingrix (1 of 2) Never done   DEXA SCAN  Never done   INFLUENZA VACCINE  10/08/2020    TETANUS/TDAP  07/14/2029   COVID-19 Vaccine  Completed   HPV VACCINES  Aged Out    Allergies  Allergen Reactions   Actonel [Risedronate Sodium]    Actonel [Risedronate] Other (See Comments)    "Allergic," per [facility]   Hct [Hydrochlorothiazide]    Hydrochlorothiazide Other (See Comments)    "Allergic," per [facility]   Lipitor [Atorvastatin Calcium]    Lipitor [Atorvastatin] Other (See Comments)    "Allergic," per [facility]   Neomycin Other (See Comments)    Unknown "been so long ago, a Dermatologist told me"   Neomycin Other (See Comments)    "Allergic," per [facility]   Polysporin [Bacitracin-Polymyxin B]    Polysporin [Bacitracin-Polymyxin B] Other (See Comments)    "Allergic," per [facility]   Prolia [Denosumab]    Prolia [Denosumab] Other (See Comments)   Sulfa Antibiotics    Sulfa Antibiotics Other (See Comments)    "Allergic," per [facility]   Zocor [Simvastatin]    Zocor [Simvastatin] Other (See Comments)    "Allergic," per [facility]    Allergies as of 12/03/2020       Reactions   Actonel [risedronate Sodium]    Actonel [risedronate] Other (See Comments)   "Allergic," per [facility]   Hct [hydrochlorothiazide]    Hydrochlorothiazide Other (See Comments)   "Allergic," per [facility]   Lipitor [atorvastatin Calcium]    Lipitor [atorvastatin] Other (See Comments)   "Allergic," per [facility]   Neomycin Other (See Comments)   Unknown "been so long ago, a Dermatologist told me"   Neomycin Other (See Comments)   "Allergic," per [facility]   Polysporin [bacitracin-polymyxin B]    Polysporin [bacitracin-polymyxin B] Other (See Comments)   "Allergic," per [facility]   Prolia [denosumab]    Prolia [denosumab] Other (See Comments)   Sulfa Antibiotics    Sulfa Antibiotics Other (See Comments)   "Allergic," per [facility]   Zocor [simvastatin]    Zocor [simvastatin] Other (See Comments)   "Allergic," per [facility]        Medication List        Accurate as of December 03, 2020 10:41 AM. If you have any questions, ask your nurse or doctor.           STOP taking these medications    traMADol 50 MG tablet Commonly known as: ULTRAM Stopped by: Virgie Dad, MD       TAKE these medications    acetaminophen 325 MG tablet Commonly known as: TYLENOL Take 650 mg by mouth in the morning, at noon, and at bedtime. What changed: Another medication with the same name was removed. Continue taking this medication, and follow the directions you see here. Changed by: Virgie Dad, MD   allopurinol 100 MG tablet Commonly known as: ZYLOPRIM Take 100 mg by mouth daily. What changed: Another medication with the same name was removed. Continue taking this medication, and follow the directions  you see here. Changed by: Virgie Dad, MD   amLODipine 5 MG tablet Commonly known as: NORVASC Take 1 tablet (5 mg total) by mouth daily.   aspirin 81 MG chewable tablet Chew 81 mg by mouth daily. What changed: Another medication with the same name was removed. Continue taking this medication, and follow the directions you see here. Changed by: Virgie Dad, MD   benazepril 10 MG tablet Commonly known as: LOTENSIN Take 10 mg by mouth in the morning. What changed: Another medication with the same name was removed. Continue taking this medication, and follow the directions you see here. Changed by: Virgie Dad, MD   Calcium Carb-Cholecalciferol 500-400 MG-UNIT Tabs Take 2 tablets by mouth daily. What changed: Another medication with the same name was removed. Continue taking this medication, and follow the directions you see here. Changed by: Virgie Dad, MD   cholecalciferol 25 MCG (1000 UNIT) tablet Commonly known as: VITAMIN D3 Take 1,000 Units by mouth 2 (two) times daily.   Cholecalciferol 50 MCG (2000 UT) Tabs Take 2,000 Units by mouth in the morning.   Co Q-10 100 MG Caps Take 200 mg by mouth in the morning. What changed: Another medication with the same name was removed. Continue taking this medication, and follow  the directions you see here. Changed by: Virgie Dad, MD   levothyroxine 100 MCG tablet Commonly known as: SYNTHROID Take 100 mcg by mouth once a week. On Wednesday What changed: Another medication with the same name was removed. Continue taking this medication, and follow the directions you see here. Changed by: Virgie Dad, MD   levothyroxine 50 MCG tablet Commonly known as: SYNTHROID Take 50 mcg by mouth daily before breakfast. Sun,Mon,Tue, Thur,Fri,Sat What changed: Another medication with the same name was removed. Continue taking this medication, and follow the directions you see here. Changed by: Virgie Dad, MD   meclizine 12.5 MG tablet Commonly known as: ANTIVERT Take 12.5 mg by mouth 2 (two) times daily as needed for dizziness.   memantine 10 MG tablet Commonly known as: NAMENDA Take 10 mg by mouth 2 (two) times daily. What changed: Another medication with the same name was removed. Continue taking this medication, and follow the directions you see here. Changed by: Virgie Dad, MD   Omega III EPA+DHA 1000 MG Caps Take 2,000 mg by mouth 2 (two) times daily. What changed: Another medication with the same name was removed. Continue taking this medication, and follow the directions you see here. Changed by: Virgie Dad, MD        Review of Systems  Constitutional:  Positive for activity change.  HENT: Negative.    Respiratory: Negative.    Cardiovascular: Negative.   Gastrointestinal: Negative.   Genitourinary: Negative.   Musculoskeletal:  Positive for gait problem.  Skin: Negative.   Neurological:  Positive for dizziness and weakness.  Psychiatric/Behavioral:  Positive for confusion.    Vitals:   12/03/20 1022  BP: (!) 160/80  Pulse: 86  Resp: 19  Temp: (!) 97 F (36.1 C)  SpO2: 98%  Weight: 128 lb 12.8 oz (58.4 kg)  Height: 5\' 4"  (1.626 m)   Body mass index is 22.11 kg/m. Physical Exam Vitals reviewed.  Constitutional:       Appearance: Normal appearance.  HENT:     Head: Normocephalic.     Nose: Nose normal.     Mouth/Throat:     Mouth: Mucous membranes are moist.  Pharynx: Oropharynx is clear.  Eyes:     Pupils: Pupils are equal, round, and reactive to light.  Cardiovascular:     Rate and Rhythm: Normal rate and regular rhythm.     Pulses: Normal pulses.  Pulmonary:     Effort: Pulmonary effort is normal.     Breath sounds: Normal breath sounds.  Abdominal:     General: Abdomen is flat. Bowel sounds are normal.     Palpations: Abdomen is soft.  Musculoskeletal:        General: No swelling.     Cervical back: Neck supple.  Skin:    General: Skin is warm.  Neurological:     General: No focal deficit present.     Mental Status: She is alert.     Comments: Alert  Follows simple commands Has Mild Aphasia   Psychiatric:        Mood and Affect: Mood normal.        Thought Content: Thought content normal.    Labs reviewed: Basic Metabolic Panel: Recent Labs    11/24/20 1322 11/24/20 1715 11/25/20 0955 11/26/20 0602  NA 141  --  141 140  K 3.0*  --  3.2* 3.8  CL 103  --  106 104  CO2 25  --  24 21*  GLUCOSE 107*  --  81 98  BUN 12  --  9 5*  CREATININE 1.37*  --  0.94 0.85  CALCIUM 9.1  --  9.0 9.2  MG  --  1.8  --   --    Liver Function Tests: Recent Labs    09/25/20 0000 10/22/20 0000 11/24/20 1322  AST 15 18 22   ALT 11 11 11   ALKPHOS 101 105 87  BILITOT  --   --  0.7  PROT  --   --  6.3*  ALBUMIN 3.8 4.1 3.4*   No results for input(s): LIPASE, AMYLASE in the last 8760 hours. No results for input(s): AMMONIA in the last 8760 hours. CBC: Recent Labs    09/25/20 0000 10/22/20 0000 11/24/20 1322  WBC 8.6 8.7 12.7*  NEUTROABS 6,364.00 7,230.00 10.4*  HGB 13.1 15.2 13.8  HCT 40 46 42.3  MCV  --   --  101.7*  PLT 264 274 249   Cardiac Enzymes: Recent Labs    11/24/20 1715  CKTOTAL 111   BNP: Invalid input(s): POCBNP No results found for: HGBA1C Lab Results   Component Value Date   TSH 2.34 06/22/2020   No results found for: VITAMINB12 No results found for: FOLATE No results found for: IRON, TIBC, FERRITIN  Imaging and Procedures obtained prior to SNF admission: CT HEAD WO CONTRAST  Result Date: 11/24/2020 CLINICAL DATA:  Altered mental status, fall EXAM: CT HEAD WITHOUT CONTRAST TECHNIQUE: Contiguous axial images were obtained from the base of the skull through the vertex without intravenous contrast. COMPARISON:  CT head 08/02/2020 FINDINGS: Brain: There is no evidence of acute intracranial hemorrhage, extra-axial fluid collection, or acute infarct. A remote infarct in the left parietal lobe is unchanged. Mild parenchymal volume loss and chronic white matter microangiopathy are unchanged. The ventricles are stable in size. There is no mass lesion. There is no midline shift. Vascular: There is calcification of the bilateral cavernous ICAs. Skull: Normal. Negative for fracture or focal lesion. Sinuses/Orbits: The paranasal sinuses are clear. The globes and orbits are unremarkable. Other: None. IMPRESSION: 1. No acute intracranial pathology. 2. Unchanged remote left parietal lobe infarct, parenchymal volume loss, and  chronic white matter microangiopathy. Electronically Signed   By: Valetta Mole M.D.   On: 11/24/2020 14:29   CT CERVICAL SPINE WO CONTRAST  Result Date: 11/24/2020 CLINICAL DATA:  Trauma, fall EXAM: CT CERVICAL SPINE WITHOUT CONTRAST TECHNIQUE: Multidetector CT imaging of the cervical spine was performed without intravenous contrast. Multiplanar CT image reconstructions were also generated. COMPARISON:  CT cervical spine 08/02/2020 FINDINGS: Alignment: There is straightened curvature of the cervical spine with slight focal kyphosis centered at C5, similar to the prior study. There is grade 1 anterolisthesis of C3 on C4 and C4 on C5, also unchanged and likely degenerative in nature. There is no evidence of traumatic malalignment. There is no  jumped or perched facet. Skull base and vertebrae: Skull base alignment is maintained. Vertebral body heights are preserved. There is no evidence of acute fracture. Soft tissues and spinal canal: No prevertebral fluid or swelling. No visible canal hematoma. Disc levels: There is marked intervertebral disc space narrowing at C5-C6, similar to the prior study. Degenerative endplate changes also most advanced at this level. There is multilevel facet arthropathy, similar to the prior study. There is mild narrowing of the craniocervical junction. The osseous spinal canal is otherwise patent. Upper chest: There is mosaic attenuation in the lung apices with mild smooth interlobular septal thickening. Other: The left thyroid lobe is surgically absent. The soft tissues are otherwise unremarkable. IMPRESSION: 1. No acute fracture or traumatic malalignment of the cervical spine. 2. Multilevel degenerative changes as above, similar to the prior study. 3. Smooth interlobular septal thickening in the lung apices can be seen with pulmonary interstitial edema. Mosaic attenuation in the lung apices suggests air trapping/small airway disease. Electronically Signed   By: Valetta Mole M.D.   On: 11/24/2020 14:22   MR ANGIO HEAD WO CONTRAST  Result Date: 11/25/2020 CLINICAL DATA:  TIA EXAM: MRI HEAD WITHOUT CONTRAST MRA HEAD WITHOUT CONTRAST TECHNIQUE: Multiplanar, multi-echo pulse sequences of the brain and surrounding structures were acquired without intravenous contrast. Angiographic images of the Circle of Willis were acquired using MRA technique without intravenous contrast. COMPARISON:  Head CT from yesterday FINDINGS: MRI HEAD FINDINGS Brain: No acute infarction, hemorrhage, hydrocephalus, extra-axial collection or mass lesion. Remote bilateral occipital and left parietal cortically based infarcts. Chronic small vessel ischemia in the deep white matter which is mild for age. Nonspecific cerebral volume loss. Vascular: Major  flow voids are preserved Skull and upper cervical spine: Posterior scalp swelling. No evidence of bone lesion. Sinuses/Orbits: Bilateral cataract resection.  No evidence of injury Other: Moderate motion artifact to the degree that findings could be obscured. MRA HEAD FINDINGS Significant motion artifact intermittently, especially at the level of the circle-of-Willis. The covered vertebral, basilar, and carotid arteries are patent. A1, M1, and P1 segments are largely obscured by motion artifact. No detected branch occlusion or aneurysm. IMPRESSION: Brain MRI: 1. No acute intracranial finding.  No acute infarct. 2. Chronic small vessel disease and small remote cortical infarcts. 3. Posterior scalp contusion. 4. Moderate motion artifact Intracranial MRA: Motion artifact with multiple nondiagnostic slices. Unremarkable vessels where visible. Electronically Signed   By: Jorje Guild M.D.   On: 11/25/2020 06:39   MR BRAIN WO CONTRAST  Result Date: 11/25/2020 CLINICAL DATA:  TIA EXAM: MRI HEAD WITHOUT CONTRAST MRA HEAD WITHOUT CONTRAST TECHNIQUE: Multiplanar, multi-echo pulse sequences of the brain and surrounding structures were acquired without intravenous contrast. Angiographic images of the Circle of Willis were acquired using MRA technique without intravenous contrast. COMPARISON:  Head CT  from yesterday FINDINGS: MRI HEAD FINDINGS Brain: No acute infarction, hemorrhage, hydrocephalus, extra-axial collection or mass lesion. Remote bilateral occipital and left parietal cortically based infarcts. Chronic small vessel ischemia in the deep white matter which is mild for age. Nonspecific cerebral volume loss. Vascular: Major flow voids are preserved Skull and upper cervical spine: Posterior scalp swelling. No evidence of bone lesion. Sinuses/Orbits: Bilateral cataract resection.  No evidence of injury Other: Moderate motion artifact to the degree that findings could be obscured. MRA HEAD FINDINGS Significant motion  artifact intermittently, especially at the level of the circle-of-Willis. The covered vertebral, basilar, and carotid arteries are patent. A1, M1, and P1 segments are largely obscured by motion artifact. No detected branch occlusion or aneurysm. IMPRESSION: Brain MRI: 1. No acute intracranial finding.  No acute infarct. 2. Chronic small vessel disease and small remote cortical infarcts. 3. Posterior scalp contusion. 4. Moderate motion artifact Intracranial MRA: Motion artifact with multiple nondiagnostic slices. Unremarkable vessels where visible. Electronically Signed   By: Jorje Guild M.D.   On: 11/25/2020 06:39   DG Pelvis Portable  Result Date: 11/25/2020 CLINICAL DATA:  Fall at home. EXAM: PORTABLE PELVIS 1-2 VIEWS COMPARISON:  None. FINDINGS: There is no evidence of pelvic fracture or diastasis. No pelvic bone lesions are seen. Atherosclerosis and osteopenia. IMPRESSION: No acute finding Electronically Signed   By: Jorje Guild M.D.   On: 11/25/2020 04:11   DG Chest Portable 1 View  Result Date: 11/24/2020 CLINICAL DATA:  Syncope. EXAM: PORTABLE CHEST 1 VIEW COMPARISON:  Jul 15, 2019 FINDINGS: Stable cardiomegaly. The hila and mediastinum are unremarkable. No pneumothorax. No overt edema or focal infiltrate. Nodular density projected over the right base is favored to represent confluence of shadows or atelectasis. No other acute abnormalities. IMPRESSION: A nodular density projected over the right base is favored to represent confluence of shadows or atelectasis. Recommend a PA and lateral chest x-[place] before discharge. No acute abnormalities are seen. Electronically Signed   By: Dorise Bullion III M.D.   On: 11/24/2020 13:45   DG Abd Portable 1V  Result Date: 11/25/2020 CLINICAL DATA:  Fall at home EXAM: PORTABLE ABDOMEN - 1 VIEW COMPARISON:  None. FINDINGS: Normal bowel gas pattern. No concerning mass effect or calcification. Generalized osteopenia and lumbar spine degeneration. L1 and L2  wedging, chronic at L2 when compared to a 2011 abdominal CT. Given the degree of T12-L1 spurring, L1 wedging is likely also chronic. IMPRESSION: Normal bowel gas pattern. Electronically Signed   By: Jorje Guild M.D.   On: 11/25/2020 04:11   EEG adult  Result Date: 11/24/2020 Derek Jack, MD     11/24/2020  8:40 PM Routine EEG Report ELOYSE CAUSEY is a 87 y.o. female with a history of spell who is undergoing an EEG to evaluate for seizures. Report: This EEG was acquired with electrodes placed according to the International 10-20 electrode system (including Fp1, Fp2, F3, F4, C3, C4, P3, P4, O1, O2, T3, T4, T5, T6, A1, A2, Fz, Cz, Pz). The following electrodes were missing or displaced: none. There was no waking occipital dominant rhythm. The best background was 6-7 Hz. This activity is reactive to stimulation. Sleep was identified by K complexes and sleep spindles. There was no focal slowing. There were no definitive interictal epileptiform discharges. There were no electrographic seizures identified. Photic stimulation and hyperventilation were not performed. Impression and clinical correlation: This EEG was obtained while asleep and is abnormal due to mild-to-moderate diffuse slowing. There were no  electrographic seizures or definitive epileptiform abnormalities seen during this recording. Su Monks, MD [facility] [phone] If 7pm- 7am, please page neurology on call as listed in [place].    Assessment/Plan Seizure-like activity [facility]) Per Neurology Most likely post fall No Anti seizure meds right now Tramadol was discontinued Primary hypertension Was started on Norvasc Already on Benazepril BP seems good controlled Not tight control due to her Falls and Dizziness  Vascular dementia without behavioral disturbance ([place]) Supportive care Wants to go back to AL. Very high risk for falls  Stage 3a chronic kidney disease (HCC) Creat staying stable  Hypothyroidism,  unspecified type TSH normal in 4/22 Abnormal chest [facility] Suggested CT scan of chest Not sure if patient or family interested in work up  Will discuss again Not candidate for work up  Gait instability Continue to be high risk Working with therapy  TIA (transient ischemic attack) On aspirin  History of CVA (cerebrovascular accident) On aspirin Allergic to statin  Gout,  Continue Allopurinol Dizziness Meclizine does help her Uses it PRN  Compression fracture of body of thoracic vertebra and Lumber vertebrae Off tramadol and Lyrica Pain control with tylenol   Family/ staff Communication:   Labs/tests ordered:

## 2020-12-21 ENCOUNTER — Encounter: Payer: Self-pay | Admitting: Nurse Practitioner

## 2020-12-21 ENCOUNTER — Non-Acute Institutional Stay (SKILLED_NURSING_FACILITY): Payer: Medicare Other | Admitting: Nurse Practitioner

## 2020-12-21 DIAGNOSIS — M109 Gout, unspecified: Secondary | ICD-10-CM

## 2020-12-21 DIAGNOSIS — R569 Unspecified convulsions: Secondary | ICD-10-CM

## 2020-12-21 DIAGNOSIS — M549 Dorsalgia, unspecified: Secondary | ICD-10-CM | POA: Diagnosis not present

## 2020-12-21 DIAGNOSIS — Z8673 Personal history of transient ischemic attack (TIA), and cerebral infarction without residual deficits: Secondary | ICD-10-CM | POA: Diagnosis not present

## 2020-12-21 DIAGNOSIS — G459 Transient cerebral ischemic attack, unspecified: Secondary | ICD-10-CM

## 2020-12-21 DIAGNOSIS — M5030 Other cervical disc degeneration, unspecified cervical region: Secondary | ICD-10-CM | POA: Diagnosis not present

## 2020-12-21 DIAGNOSIS — E039 Hypothyroidism, unspecified: Secondary | ICD-10-CM | POA: Diagnosis not present

## 2020-12-21 DIAGNOSIS — R2681 Unsteadiness on feet: Secondary | ICD-10-CM | POA: Diagnosis not present

## 2020-12-21 DIAGNOSIS — I10 Essential (primary) hypertension: Secondary | ICD-10-CM

## 2020-12-21 DIAGNOSIS — F01B0 Vascular dementia, moderate, without behavioral disturbance, psychotic disturbance, mood disturbance, and anxiety: Secondary | ICD-10-CM

## 2020-12-21 NOTE — Assessment & Plan Note (Signed)
per CT head/cervical spine 08/02/20

## 2020-12-21 NOTE — Assessment & Plan Note (Signed)
stable, on Levothyroxine. TSH 2.34 06/22/20

## 2020-12-21 NOTE — Assessment & Plan Note (Signed)
T5, T8, L3 compression fxs. Takes, Tylenol for pain

## 2020-12-21 NOTE — Assessment & Plan Note (Signed)
uses walker to ambulate.       

## 2020-12-21 NOTE — Assessment & Plan Note (Signed)
blood pressure is controlled on Benazepril, Amlodipine.  

## 2020-12-21 NOTE — Progress Notes (Signed)
Location:   SNF [place] Room Number: 31 [id number] Place of Service:  SNF (31) Provider: [facility] Tytiana Coles NP  Virgie Dad, MD  Patient Care Team: Virgie Dad, MD Gina PCP - General (Internal Medicine) Brytnee Bechler X, NP Gina Nurse Practitioner (Internal Medicine) Virgie Dad, MD Gina Consulting Physician (Internal Medicine) Virgie Dad, MD (Internal Medicine)  Extended Emergency Contact Information Primary Emergency Contact: Jeannine Kitten of [place] Phone: [phone] Relation: Son Secondary Emergency Contact: Lehan,[name] Mobile Phone: [phone] Relation: Son  Code Status:  DNR Goals of care: Advanced Directive information Advanced Directives 12/03/2020  Does Patient Have a Medical Advance Directive? Yes  Type of Advance Directive Out of facility DNR (pink MOST or yellow form)  Does patient want to make changes to medical advance directive? No - Patient declined  Copy of [place] in Chart? -  Would patient like information on creating a medical advance directive? -  Pre-existing out of facility DNR order (yellow form or pink MOST form) Pink Most/Yellow Form available - Physician notified to receive inpatient order     Chief Complaint  Patient presents with   Medical Management of Chronic Issues    Routine follow up   Health Maintenance    Shingrix, DEXA, Flu    HPI:  [name] a 87 y.o. female seen today for medical management of chronic diseases.    Gait abnormality, uses walker to ambulate.               Hx of TIA. Allergic to statin. On ASA.  HPOA desired not hospital evaluation Seizure like activity, likely post fall, off Tramadol, not taking anti seizure meds.               T5, T8, L3 compression fxs. Takes, Tylenol for pain             DDD per CT head/cervical spine 08/02/20              Hx of CVA per CT head 08/02/20 Remote infarcts within the a occipital cortices bilaterally again noted. Interval development of remote  infarcts within the left parietal lobe and periventricular white matte. On ASA             Vascular dementia, MMSE dropped from 01/02/19 29/30 to11/3/21 MMSE 18/30, failed clock drawing. 07/15/19 CT head Mild generalized parenchymal atrophy and chronic small vessel ischemic disease, tolerated Memantine well.                          HTN, blood pressure Stevenson controlled on Benazepril, Amlodipine.              CKD Bun/creat 5/0.85 11/26/20             Gout stable, on Allopurinol 100mg  qd.                        Hypothyroidism, stable, on Levothyroxine. TSH 2.34 06/22/20  Abnormal CXR, suggested CT chest, not candidate for workup    Past Medical History:  Diagnosis Date   Cognitive changes    Dementia ([place])    Depression    Gout    Hypertension    Hypothyroidism    Peripheral neuropathy    History reviewed. No pertinent surgical history.  Allergies  Allergen Reactions   Actonel [Risedronate Sodium]    Actonel [Risedronate] Other (See Comments)    "Allergic," per  [facility]   Hct [Hydrochlorothiazide]    Hydrochlorothiazide Other (See Comments)    "Allergic," per [facility]   Lipitor [Atorvastatin Calcium]    Lipitor [Atorvastatin] Other (See Comments)    "Allergic," per [facility]   Neomycin Other (See Comments)    Unknown "been so long ago, a Dermatologist told me"   Neomycin Other (See Comments)    "Allergic," per [facility]   Polysporin [Bacitracin-Polymyxin B]    Polysporin [Bacitracin-Polymyxin B] Other (See Comments)    "Allergic," per [facility]   Prolia [Denosumab]    Prolia [Denosumab] Other (See Comments)   Sulfa Antibiotics    Sulfa Antibiotics Other (See Comments)    "Allergic," per [facility]   Zocor [Simvastatin]    Zocor [Simvastatin] Other (See Comments)    "Allergic," per [facility]    Allergies Gina of 12/21/2020       Reactions   Actonel [risedronate Sodium]    Actonel [risedronate] Other (See Comments)   "Allergic," per [facility]   Hct [hydrochlorothiazide]    Hydrochlorothiazide Other (See Comments)    "Allergic," per [facility]   Lipitor [atorvastatin Calcium]    Lipitor [atorvastatin] Other (See Comments)   "Allergic," per [facility]   Neomycin Other (See Comments)   Unknown "been so long ago, a Dermatologist told me"   Neomycin Other (See Comments)   "Allergic," per [facility]   Polysporin [bacitracin-polymyxin B]    Polysporin [bacitracin-polymyxin B] Other (See Comments)   "Allergic," per [facility]   Prolia [denosumab]    Prolia [denosumab] Other (See Comments)   Sulfa Antibiotics    Sulfa Antibiotics Other (See Comments)   "Allergic," per [facility]   Zocor [simvastatin]    Zocor [simvastatin] Other (See Comments)   "Allergic," per [facility]        Medication List        Accurate Gina of December 21, 2020 11:59 PM. If you have any questions, ask your nurse or doctor.          acetaminophen 325 MG tablet Commonly known Gina: TYLENOL Take 650 mg by mouth in the morning, at noon, and at bedtime.   allopurinol 100 MG tablet Commonly known Gina: ZYLOPRIM Take 100 mg by mouth daily.   amLODipine 5 MG tablet Commonly known Gina: NORVASC Take 1 tablet (5 mg total) by mouth daily.   aspirin 81 MG chewable tablet Chew 81 mg by mouth daily.   benazepril 10 MG tablet Commonly known Gina: LOTENSIN Take 10 mg by mouth in the morning.   Calcium Carb-Cholecalciferol 500-400 MG-UNIT Tabs Take 2 tablets by mouth daily.   cholecalciferol 25 MCG (1000 UNIT) tablet Commonly known Gina: VITAMIN D3 Take 1,000 Units by mouth 2 (two) times daily.   Cholecalciferol 50 MCG (2000 UT) Tabs Take 2,000 Units by mouth in the morning.   Co Q-10 100 MG Caps Take 200 mg by mouth in the morning.   levothyroxine 100 MCG tablet Commonly known Gina: SYNTHROID Take 100 mcg by mouth once a week. On Wednesday   levothyroxine 50 MCG tablet Commonly known Gina: SYNTHROID Take 50 mcg by mouth daily before breakfast. Sun,Mon,Tue, Thur,Fri,Sat   meclizine 12.5 MG tablet Commonly known Gina: ANTIVERT Take 12.5 mg by mouth 2 (two) times  daily Gina needed for dizziness.   memantine 10 MG tablet Commonly known Gina: NAMENDA Take 10 mg by mouth 2 (two) times daily.   Omega III EPA+DHA 1000 MG Caps Take 2,000 mg by mouth 2 (two) times daily.        Review of  Systems  Constitutional:  Negative for activity change, appetite change and fever.  HENT:  Positive for hearing loss. Negative for congestion and voice change.   Eyes:  Negative for visual disturbance.  Respiratory:  Negative for shortness of breath.   Cardiovascular:  Positive for leg swelling.  Gastrointestinal:  Negative for abdominal pain and constipation.  Genitourinary:  Negative for dysuria and urgency.  Musculoskeletal:  Positive for arthralgias, back pain and gait problem.       Positional lower  portion of the mid back pain. No c/o neck, right hip, or lower back pain.   Skin:  Negative for color change.  Neurological:  Negative for speech difficulty, weakness and light-headedness.  Psychiatric/Behavioral:  Positive for confusion. Negative for behavioral problems and sleep disturbance. The patient Stevenson not nervous/anxious.    Immunization History  Administered Date(s) Administered   Influenza, High Dose Seasonal PF 12/11/2018   Influenza-Unspecified 03/24/2018, 12/21/2019   Moderna Sars-Covid-2 Vaccination 03/15/2019, 04/21/2019, 01/17/2020, 08/07/2020   Tdap 07/15/2019   Pertinent  Health Maintenance Due  Topic Date Due   DEXA SCAN  Never done   INFLUENZA VACCINE  10/08/2020   No flowsheet data found. Functional Status Survey:    Vitals:   12/21/20 1121  BP: 132/66  Pulse: 80  Resp: 18  Temp: (!) 97.1 F (36.2 C)  SpO2: 99%  Weight: 139 lb 14.4 oz (63.5 kg)  Height: 5\' 4"  (1.626 m)   Body mass index Stevenson 24.01 kg/m. Physical Exam Vitals and nursing note reviewed.  Constitutional:      Appearance: Normal appearance.  HENT:     Head: Normocephalic and atraumatic.     Mouth/Throat:     Mouth: Mucous membranes are moist.  Eyes:      Extraocular Movements: Extraocular movements intact.     Conjunctiva/sclera: Conjunctivae normal.     Pupils: Pupils are equal, round, and reactive to light.  Cardiovascular:     Rate and Rhythm: Normal rate and regular rhythm.     Heart sounds: No murmur heard. Pulmonary:     Effort: Pulmonary effort Stevenson normal.     Breath sounds: No rales.  Abdominal:     General: Bowel sounds are normal.     Palpations: Abdomen Stevenson soft.     Tenderness: There Stevenson no abdominal tenderness.  Musculoskeletal:        General: Tenderness present.     Cervical back: Normal range of motion and neck supple.     Right lower leg: Edema present.     Left lower leg: Edema present.     Comments: C/o lower portion of the mid back pain with position changing. Trace edema BLE-mostly in feet/ankles.   Skin:    General: Skin Stevenson warm and dry.  Neurological:     General: No focal deficit present.     Mental Status: She Stevenson alert. Mental status Stevenson at baseline.     Motor: No weakness.     Coordination: Coordination normal.     Gait: Gait abnormal.  Psychiatric:        Mood and Affect: Mood normal.        Behavior: Behavior normal.    Labs reviewed: Recent Labs    11/24/20 1322 11/24/20 1715 11/25/20 0955 11/26/20 0602  NA 141  --  141 140  K 3.0*  --  3.2* 3.8  CL 103  --  106 104  CO2 25  --  24 21*  GLUCOSE 107*  --  81  98  BUN 12  --  9 5*  CREATININE 1.37*  --  0.94 0.85  CALCIUM 9.1  --  9.0 9.2  MG  --  1.8  --   --    Recent Labs    09/25/20 0000 10/22/20 0000 11/24/20 1322  AST 15 18 22   ALT 11 11 11   ALKPHOS 101 105 87  BILITOT  --   --  0.7  PROT  --   --  6.3*  ALBUMIN 3.8 4.1 3.4*   Recent Labs    09/25/20 0000 10/22/20 0000 11/24/20 1322  WBC 8.6 8.7 12.7*  NEUTROABS 6,364.00 7,230.00 10.4*  HGB 13.1 15.2 13.8  HCT 40 46 42.3  MCV  --   --  101.7*  PLT 264 274 249   Lab Results  Component Value Date   TSH 2.34 06/22/2020   No results found for: HGBA1C No results  found for: CHOL, HDL, LDLCALC, LDLDIRECT, TRIG, CHOLHDL  Significant Diagnostic Results in last 30 days:  DG Chest 2 View  Result Date: 11/26/2020 CLINICAL DATA:  Fall at nursing home, syncope, altered mental status, abnormal chest radiograph question RIGHT base lung mass EXAM: CHEST - 2 VIEW COMPARISON:  11/24/2020 FINDINGS: Normal heart size, mediastinal contours, and pulmonary vascularity. Atherosclerotic calcification aorta. Bronchitic changes with bibasilar atelectasis. No infiltrate, pleural effusion, or pneumothorax. Area of question nodularity on the previous exam Stevenson partially obscured by the diaphragm on the current study, suboptimally assessed. This could either represent a lung nodule or be associated with calcification within anterior costal cartilage associated with the anterior RIGHT fourth rib. IMPRESSION: Bronchitic changes with bibasilar atelectasis. Questionable nodular density versus artifact from costal cartilaginous calcification at the anterior RIGHT fourth rib, suboptimally assessed; if clinically indicated based on patient age and comorbidities, this could be further assessed by CT. Electronically Signed   By: Lavonia Dana M.D.   On: 11/26/2020 11:50   CT HEAD WO CONTRAST  Result Date: 11/24/2020 CLINICAL DATA:  Altered mental status, fall EXAM: CT HEAD WITHOUT CONTRAST TECHNIQUE: Contiguous axial images were obtained from the base of the skull through the vertex without intravenous contrast. COMPARISON:  CT head 08/02/2020 FINDINGS: Brain: There Stevenson no evidence of acute intracranial hemorrhage, extra-axial fluid collection, or acute infarct. A remote infarct in the left parietal lobe Stevenson unchanged. Mild parenchymal volume loss and chronic white matter microangiopathy are unchanged. The ventricles are stable in size. There Stevenson no mass lesion. There Stevenson no midline shift. Vascular: There Stevenson calcification of the bilateral cavernous ICAs. Skull: Normal. Negative for fracture or focal lesion.  Sinuses/Orbits: The paranasal sinuses are clear. The globes and orbits are unremarkable. Other: Stevenson. IMPRESSION: 1. No acute intracranial pathology. 2. Unchanged remote left parietal lobe infarct, parenchymal volume loss, and chronic white matter microangiopathy. Electronically Signed   By: Valetta Mole M.D.   On: 11/24/2020 14:29   CT CERVICAL SPINE WO CONTRAST  Result Date: 11/24/2020 CLINICAL DATA:  Trauma, fall EXAM: CT CERVICAL SPINE WITHOUT CONTRAST TECHNIQUE: Multidetector CT imaging of the cervical spine was performed without intravenous contrast. Multiplanar CT image reconstructions were also generated. COMPARISON:  CT cervical spine 08/02/2020 FINDINGS: Alignment: There Stevenson straightened curvature of the cervical spine with slight focal kyphosis centered at C5, similar to the prior study. There Stevenson grade 1 anterolisthesis of C3 on C4 and C4 on C5, also unchanged and likely degenerative in nature. There Stevenson no evidence of traumatic malalignment. There Stevenson no jumped or perched facet. Skull base  and vertebrae: Skull base alignment Stevenson maintained. Vertebral body heights are preserved. There Stevenson no evidence of acute fracture. Soft tissues and spinal canal: No prevertebral fluid or swelling. No visible canal hematoma. Disc levels: There Stevenson marked intervertebral disc space narrowing at C5-C6, similar to the prior study. Degenerative endplate changes also most advanced at this level. There Stevenson multilevel facet arthropathy, similar to the prior study. There Stevenson mild narrowing of the craniocervical junction. The osseous spinal canal Stevenson otherwise patent. Upper chest: There Stevenson mosaic attenuation in the lung apices with mild smooth interlobular septal thickening. Other: The left thyroid lobe Stevenson surgically absent. The soft tissues are otherwise unremarkable. IMPRESSION: 1. No acute fracture or traumatic malalignment of the cervical spine. 2. Multilevel degenerative changes Gina above, similar to the prior study. 3. Smooth  interlobular septal thickening in the lung apices can be seen with pulmonary interstitial edema. Mosaic attenuation in the lung apices suggests air trapping/small airway disease. Electronically Signed   By: Valetta Mole M.D.   On: 11/24/2020 14:22   MR ANGIO HEAD WO CONTRAST  Result Date: 11/25/2020 CLINICAL DATA:  TIA EXAM: MRI HEAD WITHOUT CONTRAST MRA HEAD WITHOUT CONTRAST TECHNIQUE: Multiplanar, multi-echo pulse sequences of the brain and surrounding structures were acquired without intravenous contrast. Angiographic images of the Circle of Willis were acquired using MRA technique without intravenous contrast. COMPARISON:  Head CT from yesterday FINDINGS: MRI HEAD FINDINGS Brain: No acute infarction, hemorrhage, hydrocephalus, extra-axial collection or mass lesion. Remote bilateral occipital and left parietal cortically based infarcts. Chronic small vessel ischemia in the deep white matter which Stevenson mild for age. Nonspecific cerebral volume loss. Vascular: Major flow voids are preserved Skull and upper cervical spine: Posterior scalp swelling. No evidence of bone lesion. Sinuses/Orbits: Bilateral cataract resection.  No evidence of injury Other: Moderate motion artifact to the degree that findings could be obscured. MRA HEAD FINDINGS Significant motion artifact intermittently, especially at the level of the circle-of-Willis. The covered vertebral, basilar, and carotid arteries are patent. A1, M1, and P1 segments are largely obscured by motion artifact. No detected branch occlusion or aneurysm. IMPRESSION: Brain MRI: 1. No acute intracranial finding.  No acute infarct. 2. Chronic small vessel disease and small remote cortical infarcts. 3. Posterior scalp contusion. 4. Moderate motion artifact Intracranial MRA: Motion artifact with multiple nondiagnostic slices. Unremarkable vessels where visible. Electronically Signed   By: Jorje Guild M.D.   On: 11/25/2020 06:39   MR BRAIN WO CONTRAST  Result Date:  11/25/2020 CLINICAL DATA:  TIA EXAM: MRI HEAD WITHOUT CONTRAST MRA HEAD WITHOUT CONTRAST TECHNIQUE: Multiplanar, multi-echo pulse sequences of the brain and surrounding structures were acquired without intravenous contrast. Angiographic images of the Circle of Willis were acquired using MRA technique without intravenous contrast. COMPARISON:  Head CT from yesterday FINDINGS: MRI HEAD FINDINGS Brain: No acute infarction, hemorrhage, hydrocephalus, extra-axial collection or mass lesion. Remote bilateral occipital and left parietal cortically based infarcts. Chronic small vessel ischemia in the deep white matter which Stevenson mild for age. Nonspecific cerebral volume loss. Vascular: Major flow voids are preserved Skull and upper cervical spine: Posterior scalp swelling. No evidence of bone lesion. Sinuses/Orbits: Bilateral cataract resection.  No evidence of injury Other: Moderate motion artifact to the degree that findings could be obscured. MRA HEAD FINDINGS Significant motion artifact intermittently, especially at the level of the circle-of-Willis. The covered vertebral, basilar, and carotid arteries are patent. A1, M1, and P1 segments are largely obscured by motion artifact. No detected branch occlusion or aneurysm.  IMPRESSION: Brain MRI: 1. No acute intracranial finding.  No acute infarct. 2. Chronic small vessel disease and small remote cortical infarcts. 3. Posterior scalp contusion. 4. Moderate motion artifact Intracranial MRA: Motion artifact with multiple nondiagnostic slices. Unremarkable vessels where visible. Electronically Signed   By: Jorje Guild M.D.   On: 11/25/2020 06:39   DG Pelvis Portable  Result Date: 11/25/2020 CLINICAL DATA:  Fall at home. EXAM: PORTABLE PELVIS 1-2 VIEWS COMPARISON:  Stevenson. FINDINGS: There Stevenson no evidence of pelvic fracture or diastasis. No pelvic bone lesions are seen. Atherosclerosis and osteopenia. IMPRESSION: No acute finding Electronically Signed   By: Jorje Guild M.D.    On: 11/25/2020 04:11   DG Chest Portable 1 View  Result Date: 11/24/2020 CLINICAL DATA:  Syncope. EXAM: PORTABLE CHEST 1 VIEW COMPARISON:  Jul 15, 2019 FINDINGS: Stable cardiomegaly. The hila and mediastinum are unremarkable. No pneumothorax. No overt edema or focal infiltrate. Nodular density projected over the right base Stevenson favored to represent confluence of shadows or atelectasis. No other acute abnormalities. IMPRESSION: A nodular density projected over the right base Stevenson favored to represent confluence of shadows or atelectasis. Recommend a PA and lateral chest x-Robleto before discharge. No acute abnormalities are seen. Electronically Signed   By: Dorise Bullion III M.D.   On: 11/24/2020 13:45   DG Abd Portable 1V  Result Date: 11/25/2020 CLINICAL DATA:  Fall at home EXAM: PORTABLE ABDOMEN - 1 VIEW COMPARISON:  Stevenson. FINDINGS: Normal bowel gas pattern. No concerning mass effect or calcification. Generalized osteopenia and lumbar spine degeneration. L1 and L2 wedging, chronic at L2 when compared to a 2011 abdominal CT. Given the degree of T12-L1 spurring, L1 wedging Stevenson likely also chronic. IMPRESSION: Normal bowel gas pattern. Electronically Signed   By: Jorje Guild M.D.   On: 11/25/2020 04:11   EEG adult  Result Date: 11/24/2020 Derek Jack, MD     11/24/2020  8:40 PM Routine EEG Report ANALA WHISENANT Stevenson a 87 y.o. female with a history of spell who Stevenson undergoing an EEG to evaluate for seizures. Report: This EEG was acquired with electrodes placed according to the International 10-20 electrode system (including Fp1, Fp2, F3, F4, C3, C4, P3, P4, O1, O2, T3, T4, T5, T6, A1, A2, Fz, Cz, Pz). The following electrodes were missing or displaced: Stevenson. There was no waking occipital dominant rhythm. The best background was 6-7 Hz. This activity Stevenson reactive to stimulation. Sleep was identified by K complexes and sleep spindles. There was no focal slowing. There were no definitive interictal epileptiform  discharges. There were no electrographic seizures identified. Photic stimulation and hyperventilation were not performed. Impression and clinical correlation: This EEG was obtained while asleep and Stevenson abnormal due to mild-to-moderate diffuse slowing. There were no electrographic seizures or definitive epileptiform abnormalities seen during this recording. Su Monks, MD [facility] [phone] If 7pm- 7am, please page neurology on call Gina listed in [place].    Assessment/Plan  Vascular dementia ([place]) MMSE dropped from 01/02/19 29/30 to11/3/21 MMSE 18/30, failed clock drawing. 07/15/19 CT head Mild generalized parenchymal atrophy and chronic small vessel ischemic disease, tolerated Memantine well.   HTN (hypertension) blood pressure Stevenson controlled on Benazepril, Amlodipine.   Gout  stable, on Allopurinol 100mg  qd.     Hypothyroidism stable, on Levothyroxine. TSH 2.34 06/22/20  History of CVA (cerebrovascular accident) Hx of CVA per CT head 08/02/20 Remote infarcts within the a occipital cortices bilaterally again noted. Interval development of remote infarcts within the  left parietal lobe and periventricular white matte. On ASA  DDD (degenerative disc disease), cervical per CT head/cervical spine 08/02/20   Mid back pain  T5, T8, L3 compression fxs. Takes, Tylenol for pain  Seizure-like activity ([place])  likely post fall, off Tramadol, not taking anti seizure meds.   TIA (transient ischemic attack) Hx of TIA. Allergic to statin. On ASA.  HPOA desired not hospital evaluation  Gait instability uses walker to ambulate.                 Family/ staff Communication: plan of care reviewed with the patient and charge nurse.   Labs/tests ordered:  Stevenson  Time spend 35 minutes.

## 2020-12-21 NOTE — Assessment & Plan Note (Signed)
stable, on Allopurinol 100mg  qd.

## 2020-12-21 NOTE — Assessment & Plan Note (Signed)
likely post fall, off Tramadol, not taking anti seizure meds.  

## 2020-12-21 NOTE — Assessment & Plan Note (Signed)
Hx of CVA per CT head 08/02/20 Remote infarcts within the a occipital cortices bilaterally again noted. Interval development of remote infarcts within the left parietal lobe and periventricular white matte. On ASA 

## 2020-12-21 NOTE — Assessment & Plan Note (Signed)
Hx of TIA. Allergic to statin. On ASA.  HPOA desired not hospital evaluation

## 2020-12-21 NOTE — Assessment & Plan Note (Signed)
MMSE dropped from 01/02/19 29/30 to11/3/21 MMSE 18/30, failed clock drawing. 07/15/19 CT head Mild generalized parenchymal atrophy and chronic small vessel ischemic disease, tolerated Memantine well.

## 2020-12-24 ENCOUNTER — Encounter: Payer: Self-pay | Admitting: Nurse Practitioner

## 2020-12-26 DIAGNOSIS — R2681 Unsteadiness on feet: Secondary | ICD-10-CM | POA: Diagnosis not present

## 2020-12-26 DIAGNOSIS — M6281 Muscle weakness (generalized): Secondary | ICD-10-CM | POA: Diagnosis not present

## 2020-12-26 DIAGNOSIS — R296 Repeated falls: Secondary | ICD-10-CM | POA: Diagnosis not present

## 2020-12-29 DIAGNOSIS — R2681 Unsteadiness on feet: Secondary | ICD-10-CM | POA: Diagnosis not present

## 2020-12-29 DIAGNOSIS — M6281 Muscle weakness (generalized): Secondary | ICD-10-CM | POA: Diagnosis not present

## 2020-12-29 DIAGNOSIS — R296 Repeated falls: Secondary | ICD-10-CM | POA: Diagnosis not present

## 2020-12-31 DIAGNOSIS — R2681 Unsteadiness on feet: Secondary | ICD-10-CM | POA: Diagnosis not present

## 2020-12-31 DIAGNOSIS — R296 Repeated falls: Secondary | ICD-10-CM | POA: Diagnosis not present

## 2020-12-31 DIAGNOSIS — M6281 Muscle weakness (generalized): Secondary | ICD-10-CM | POA: Diagnosis not present

## 2021-01-01 DIAGNOSIS — R296 Repeated falls: Secondary | ICD-10-CM | POA: Diagnosis not present

## 2021-01-01 DIAGNOSIS — M6281 Muscle weakness (generalized): Secondary | ICD-10-CM | POA: Diagnosis not present

## 2021-01-01 DIAGNOSIS — R2681 Unsteadiness on feet: Secondary | ICD-10-CM | POA: Diagnosis not present

## 2021-01-04 DIAGNOSIS — R296 Repeated falls: Secondary | ICD-10-CM | POA: Diagnosis not present

## 2021-01-04 DIAGNOSIS — R2681 Unsteadiness on feet: Secondary | ICD-10-CM | POA: Diagnosis not present

## 2021-01-04 DIAGNOSIS — M6281 Muscle weakness (generalized): Secondary | ICD-10-CM | POA: Diagnosis not present

## 2021-01-07 DIAGNOSIS — R2681 Unsteadiness on feet: Secondary | ICD-10-CM | POA: Diagnosis not present

## 2021-01-07 DIAGNOSIS — R296 Repeated falls: Secondary | ICD-10-CM | POA: Diagnosis not present

## 2021-01-07 DIAGNOSIS — M6281 Muscle weakness (generalized): Secondary | ICD-10-CM | POA: Diagnosis not present

## 2021-01-08 DIAGNOSIS — R296 Repeated falls: Secondary | ICD-10-CM | POA: Diagnosis not present

## 2021-01-08 DIAGNOSIS — R2681 Unsteadiness on feet: Secondary | ICD-10-CM | POA: Diagnosis not present

## 2021-01-08 DIAGNOSIS — M6281 Muscle weakness (generalized): Secondary | ICD-10-CM | POA: Diagnosis not present

## 2021-01-09 DIAGNOSIS — R2681 Unsteadiness on feet: Secondary | ICD-10-CM | POA: Diagnosis not present

## 2021-01-09 DIAGNOSIS — M6281 Muscle weakness (generalized): Secondary | ICD-10-CM | POA: Diagnosis not present

## 2021-01-09 DIAGNOSIS — R296 Repeated falls: Secondary | ICD-10-CM | POA: Diagnosis not present

## 2021-01-11 DIAGNOSIS — R2681 Unsteadiness on feet: Secondary | ICD-10-CM | POA: Diagnosis not present

## 2021-01-11 DIAGNOSIS — R296 Repeated falls: Secondary | ICD-10-CM | POA: Diagnosis not present

## 2021-01-11 DIAGNOSIS — M6281 Muscle weakness (generalized): Secondary | ICD-10-CM | POA: Diagnosis not present

## 2021-01-15 DIAGNOSIS — R296 Repeated falls: Secondary | ICD-10-CM | POA: Diagnosis not present

## 2021-01-15 DIAGNOSIS — M6281 Muscle weakness (generalized): Secondary | ICD-10-CM | POA: Diagnosis not present

## 2021-01-15 DIAGNOSIS — R2681 Unsteadiness on feet: Secondary | ICD-10-CM | POA: Diagnosis not present

## 2021-01-16 DIAGNOSIS — R296 Repeated falls: Secondary | ICD-10-CM | POA: Diagnosis not present

## 2021-01-16 DIAGNOSIS — R2681 Unsteadiness on feet: Secondary | ICD-10-CM | POA: Diagnosis not present

## 2021-01-16 DIAGNOSIS — M6281 Muscle weakness (generalized): Secondary | ICD-10-CM | POA: Diagnosis not present

## 2021-01-17 DIAGNOSIS — M6281 Muscle weakness (generalized): Secondary | ICD-10-CM | POA: Diagnosis not present

## 2021-01-17 DIAGNOSIS — R2681 Unsteadiness on feet: Secondary | ICD-10-CM | POA: Diagnosis not present

## 2021-01-17 DIAGNOSIS — R296 Repeated falls: Secondary | ICD-10-CM | POA: Diagnosis not present

## 2021-01-18 DIAGNOSIS — R296 Repeated falls: Secondary | ICD-10-CM | POA: Diagnosis not present

## 2021-01-18 DIAGNOSIS — R2681 Unsteadiness on feet: Secondary | ICD-10-CM | POA: Diagnosis not present

## 2021-01-18 DIAGNOSIS — M6281 Muscle weakness (generalized): Secondary | ICD-10-CM | POA: Diagnosis not present

## 2021-01-21 ENCOUNTER — Non-Acute Institutional Stay (SKILLED_NURSING_FACILITY): Payer: Medicare Other | Admitting: Nurse Practitioner

## 2021-01-21 ENCOUNTER — Encounter: Payer: Self-pay | Admitting: Nurse Practitioner

## 2021-01-21 DIAGNOSIS — R635 Abnormal weight gain: Secondary | ICD-10-CM

## 2021-01-21 DIAGNOSIS — Z8673 Personal history of transient ischemic attack (TIA), and cerebral infarction without residual deficits: Secondary | ICD-10-CM | POA: Diagnosis not present

## 2021-01-21 DIAGNOSIS — R569 Unspecified convulsions: Secondary | ICD-10-CM

## 2021-01-21 DIAGNOSIS — M159 Polyosteoarthritis, unspecified: Secondary | ICD-10-CM

## 2021-01-21 DIAGNOSIS — E039 Hypothyroidism, unspecified: Secondary | ICD-10-CM | POA: Diagnosis not present

## 2021-01-21 DIAGNOSIS — M6281 Muscle weakness (generalized): Secondary | ICD-10-CM | POA: Diagnosis not present

## 2021-01-21 DIAGNOSIS — N1831 Chronic kidney disease, stage 3a: Secondary | ICD-10-CM

## 2021-01-21 DIAGNOSIS — F01B0 Vascular dementia, moderate, without behavioral disturbance, psychotic disturbance, mood disturbance, and anxiety: Secondary | ICD-10-CM | POA: Diagnosis not present

## 2021-01-21 DIAGNOSIS — M109 Gout, unspecified: Secondary | ICD-10-CM | POA: Diagnosis not present

## 2021-01-21 DIAGNOSIS — R2681 Unsteadiness on feet: Secondary | ICD-10-CM | POA: Diagnosis not present

## 2021-01-21 DIAGNOSIS — G459 Transient cerebral ischemic attack, unspecified: Secondary | ICD-10-CM

## 2021-01-21 DIAGNOSIS — R296 Repeated falls: Secondary | ICD-10-CM | POA: Diagnosis not present

## 2021-01-21 NOTE — Assessment & Plan Note (Signed)
per CT head 08/02/20 Remote infarcts within the a occipital cortices bilaterally again noted. Interval development of remote infarcts within the left parietal lobe and periventricular white matte. On ASA

## 2021-01-21 NOTE — Assessment & Plan Note (Signed)
MMSE dropped from 01/02/19 29/30 to11/3/21 MMSE 18/30, failed clock drawing. 07/15/19 CT head Mild generalized parenchymal atrophy and chronic small vessel ischemic disease, tolerated Memantine well.

## 2021-01-21 NOTE — Progress Notes (Signed)
Location:   [facility] Room Number: 31 A Place of Service:  SNF (31) Provider:  Tequila Rottmann X, NP  Virgie Dad, MD  Patient Care Team: Virgie Dad, MD as PCP - General (Internal Medicine) Rashonda Warrior X, NP as Nurse Practitioner (Internal Medicine) Virgie Dad, MD as Consulting Physician (Internal Medicine) Virgie Dad, MD (Internal Medicine)  Extended Emergency Contact Information Primary Emergency Contact: Jeannine Kitten of [place] Phone: [phone] Relation: Son Secondary Emergency Contact: Ingalsbe,[name] Mobile Phone: [phone] Relation: Son  Code Status:  DNR Goals of care: Advanced Directive information Advanced Directives 01/21/2021  Does Patient Have a Medical Advance Directive? Yes  Type of Advance Directive Out of facility DNR (pink MOST or yellow form)  Does patient want to make changes to medical advance directive? No - Patient declined  Copy of [place] in Chart? -  Would patient like information on creating a medical advance directive? -  Pre-existing out of facility DNR order (yellow form or pink MOST form) Pink Most/Yellow Form available - Physician notified to receive inpatient order     Chief Complaint  Patient presents with   Medical Management of Chronic Issues    Routine visit   Quality Metric Gaps    Shingrix,Pneumonia Vaccine, DEXA, COVID #5, FLU    HPI:  Pt is a 87 y.o. female seen today for medical management of chronic diseases.     Gait abnormality, uses walker to ambulate.               Hx of TIA. Allergic to statin. On ASA.  HPOA desired not hospital evaluation Seizure like activity, likely post fall, off Tramadol, not taking anti seizure meds.               T5, T8, L3 compression fxs. Takes, Tylenol for pain             DDD per CT head/cervical spine 08/02/20              Hx of CVA per CT head 08/02/20 Remote infarcts within the a occipital cortices bilaterally again  noted. Interval development of remote infarcts within the left parietal lobe and periventricular white matte. On ASA             Vascular dementia, MMSE dropped from 01/02/19 29/30 to11/3/21 MMSE 18/30, failed clock drawing. 07/15/19 CT head Mild generalized parenchymal atrophy and chronic small vessel ischemic disease, tolerated Memantine well.                          HTN, blood pressure is controlled on Benazepril, Amlodipine.              CKD Bun/creat 5/0.85 11/26/20             Gout stable, on Allopurinol 100mg  qd.                        Hypothyroidism, stable, on Levothyroxine. TSH 2.34 06/22/20             Abnormal CXR, suggested CT chest, not candidate for workup   Past Medical History:  Diagnosis Date   Cognitive changes    Dementia ([place])    Depression    Gout    Hypertension    Hypothyroidism    Peripheral neuropathy    History reviewed. No pertinent surgical history.  Allergies  Allergen Reactions  Actonel [Risedronate Sodium]    Actonel [Risedronate] Other (See Comments)    "Allergic," per MAR   Hct [Hydrochlorothiazide]    Hydrochlorothiazide Other (See Comments)    "Allergic," per MAR   Lipitor [Atorvastatin Calcium]    Lipitor [Atorvastatin] Other (See Comments)    "Allergic," per MAR   Neomycin Other (See Comments)    Unknown "been so long ago, a Dermatologist told me"   Neomycin Other (See Comments)    "Allergic," per [facility]   Polysporin [Bacitracin-Polymyxin B]    Polysporin [Bacitracin-Polymyxin B] Other (See Comments)    "Allergic," per MAR   Prolia [Denosumab]    Prolia [Denosumab] Other (See Comments)   Sulfa Antibiotics    Sulfa Antibiotics Other (See Comments)    "Allergic," per MAR   Zocor [Simvastatin]    Zocor [Simvastatin] Other (See Comments)    "Allergic," per MAR    Allergies as of 01/21/2021       Reactions   Actonel [risedronate Sodium]    Actonel [risedronate] Other (See Comments)   "Allergic," per MAR   Hct [hydrochlorothiazide]     Hydrochlorothiazide Other (See Comments)   "Allergic," per MAR   Lipitor [atorvastatin Calcium]    Lipitor [atorvastatin] Other (See Comments)   "Allergic," per MAR   Neomycin Other (See Comments)   Unknown "been so long ago, a Dermatologist told me"   Neomycin Other (See Comments)   "Allergic," per MAR   Polysporin [bacitracin-polymyxin B]    Polysporin [bacitracin-polymyxin B] Other (See Comments)   "Allergic," per MAR   Prolia [denosumab]    Prolia [denosumab] Other (See Comments)   Sulfa Antibiotics    Sulfa Antibiotics Other (See Comments)   "Allergic," per [facility]   Zocor [simvastatin]    Zocor [simvastatin] Other (See Comments)   "Allergic," per [facility]        Medication List        Accurate as of January 21, 2021 11:59 PM. If you have any questions, ask your nurse or doctor.          acetaminophen 325 MG tablet Commonly known as: TYLENOL Take 650 mg by mouth in the morning, at noon, and at bedtime.   allopurinol 100 MG tablet Commonly known as: ZYLOPRIM Take 100 mg by mouth daily.   amLODipine 5 MG tablet Commonly known as: NORVASC Take 1 tablet (5 mg total) by mouth daily.   aspirin 81 MG chewable tablet Chew 81 mg by mouth daily.   benazepril 10 MG tablet Commonly known as: LOTENSIN Take 10 mg by mouth in the morning.   Calcium Carb-Cholecalciferol 500-400 MG-UNIT Tabs Take 2 tablets by mouth daily.   Co Q-10 100 MG Caps Take 200 mg by mouth in the morning.   levothyroxine 100 MCG tablet Commonly known as: SYNTHROID Take 100 mcg by mouth once a week. On Wednesday   levothyroxine 50 MCG tablet Commonly known as: SYNTHROID Take 50 mcg by mouth daily before breakfast. Sun,Mon,Tue, Thur,Fri,Sat   meclizine 12.5 MG tablet Commonly known as: ANTIVERT Take 12.5 mg by mouth 2 (two) times daily as needed for dizziness.   memantine 10 MG tablet Commonly known as: NAMENDA Take 10 mg by mouth 2 (two) times daily.   Omega III EPA+DHA 1000 MG Caps Take  2,000 mg by mouth 2 (two) times daily.   Vitamin D 50 MCG (2000 UT) Caps Take 2,000 Units by mouth daily.   Cholecalciferol 50 MCG (2000 UT) Tabs Take 2,000 Units by mouth in the morning.  Review of Systems  Unable to perform ROS: Dementia   Immunization History  Administered Date(s) Administered   Influenza, High Dose Seasonal PF 12/11/2018   Influenza-Unspecified 03/24/2018, 12/21/2019, 12/26/2020   Moderna Sars-Covid-2 Vaccination 03/12/2019, 04/09/2019, 04/21/2019, 01/17/2020, 08/07/2020   Tdap 07/15/2019   Pertinent  Health Maintenance Due  Topic Date Due   DEXA SCAN  Never done   INFLUENZA VACCINE  Completed   Fall Risk 11/25/2020 11/26/2020 11/26/2020 11/27/2020 11/27/2020  Patient Fall Risk Level High fall risk High fall risk High fall risk High fall risk High fall risk   Functional Status Survey:    Vitals:   01/21/21 1428  BP: 138/83  Pulse: 83  Resp: 18  Temp: 97.7 F (36.5 C)  SpO2: 96%  Weight: 143 lb 1.6 oz (64.9 kg)  Height: 5\' 4"  (1.626 m)   Body mass index is 24.56 kg/m. Physical Exam Vitals and nursing note reviewed.  Constitutional:      Appearance: Normal appearance.     Comments: Weight gained about #4Ibs in the past month.   HENT:     Head: Normocephalic and atraumatic.     Mouth/Throat:     Mouth: Mucous membranes are moist.  Eyes:     Extraocular Movements: Extraocular movements intact.     Conjunctiva/sclera: Conjunctivae normal.     Pupils: Pupils are equal, round, and reactive to light.  Cardiovascular:     Rate and Rhythm: Normal rate and regular rhythm.     Heart sounds: No murmur heard. Pulmonary:     Effort: Pulmonary effort is normal.     Breath sounds: No rales.  Abdominal:     General: Bowel sounds are normal.     Palpations: Abdomen is soft.     Tenderness: There is no abdominal tenderness.  Musculoskeletal:        General: Tenderness present.     Cervical back: Normal range of motion and neck supple.      Right lower leg: Edema present.     Left lower leg: Edema present.     Comments: C/o lower portion of the mid back pain with position changing. Trace edema BLE-mostly in feet/ankles.   Skin:    General: Skin is warm and dry.  Neurological:     General: No focal deficit present.     Mental Status: She is alert. Mental status is at baseline.     Motor: No weakness.     Coordination: Coordination normal.     Gait: Gait abnormal.  Psychiatric:        Mood and Affect: Mood normal.        Behavior: Behavior normal.    Labs reviewed: Recent Labs    11/24/20 1322 11/24/20 1715 11/25/20 0955 11/26/20 0602  NA 141  --  141 140  K 3.0*  --  3.2* 3.8  CL 103  --  106 104  CO2 25  --  24 21*  GLUCOSE 107*  --  81 98  BUN 12  --  9 5*  CREATININE 1.37*  --  0.94 0.85  CALCIUM 9.1  --  9.0 9.2  MG  --  1.8  --   --    Recent Labs    09/25/20 0000 10/22/20 0000 11/24/20 1322  AST 15 18 22   ALT 11 11 11   ALKPHOS 101 105 87  BILITOT  --   --  0.7  PROT  --   --  6.3*  ALBUMIN 3.8 4.1 3.4*  Recent Labs    09/25/20 0000 10/22/20 0000 11/24/20 1322  WBC 8.6 8.7 12.7*  NEUTROABS 6,364.00 7,230.00 10.4*  HGB 13.1 15.2 13.8  HCT 40 46 42.3  MCV  --   --  101.7*  PLT 264 274 249   Lab Results  Component Value Date   TSH 2.34 06/22/2020   No results found for: HGBA1C No results found for: CHOL, HDL, LDLCALC, LDLDIRECT, TRIG, CHOLHDL  Significant Diagnostic Results in last 30 days:  No results found.  Assessment/Plan CKD (chronic kidney disease) stage 3, GFR 30-59 ml/min (HCC) Bun/creat 5/0.85 11/26/20  Gout No recent flare ups, on Allopurinol 100mg  qd.        Hypothyroidism stable, on Levothyroxine. TSH 2.34 06/22/20  Vascular dementia ([place])  MMSE dropped from 01/02/19 29/30 to11/3/21 MMSE 18/30, failed clock drawing. 07/15/19 CT head Mild generalized parenchymal atrophy and chronic small vessel ischemic disease, tolerated Memantine well.     History of stroke per  CT head 08/02/20 Remote infarcts within the a occipital cortices bilaterally again noted. Interval development of remote infarcts within the left parietal lobe and periventricular white matte. On ASA  Generalized osteoarthritis of multiple sites T5, T8, L3 compression fxs. Takes, Tylenol for pain.  DDD per CT head/cervical spine 08/02/20   Seizure-like activity ([place])  likely post fall, off Tramadol, not taking anti seizure meds.   TIA (transient ischemic attack)  Allergic to statin. On ASA.  HPOA desired not hospital evaluation  Gait instability uses walker to ambulate.                Weight gain #2-3 Ibs in the past month, f/u dietitian, observe for fluid retention.     Family/ staff Communication: plan of care reviewed with the patient and charge nurse.   Labs/tests ordered:  none  Time spend 35 minutes.

## 2021-01-21 NOTE — Assessment & Plan Note (Signed)
T5, T8, L3 compression fxs. Takes, Tylenol for pain.  DDD per CT head/cervical spine 08/02/20

## 2021-01-21 NOTE — Assessment & Plan Note (Signed)
Allergic to statin. On ASA.  HPOA desired not hospital evaluation

## 2021-01-21 NOTE — Assessment & Plan Note (Signed)
No recent flare ups, on Allopurinol 100mg  qd.

## 2021-01-21 NOTE — Assessment & Plan Note (Signed)
likely post fall, off Tramadol, not taking anti seizure meds.  

## 2021-01-21 NOTE — Assessment & Plan Note (Signed)
uses walker to ambulate.       

## 2021-01-21 NOTE — Assessment & Plan Note (Signed)
stable, on Levothyroxine. TSH 2.34 06/22/20

## 2021-01-21 NOTE — Assessment & Plan Note (Signed)
Bun/creat 5/0.85 11/26/20

## 2021-01-22 DIAGNOSIS — R296 Repeated falls: Secondary | ICD-10-CM | POA: Diagnosis not present

## 2021-01-22 DIAGNOSIS — R2681 Unsteadiness on feet: Secondary | ICD-10-CM | POA: Diagnosis not present

## 2021-01-22 DIAGNOSIS — M6281 Muscle weakness (generalized): Secondary | ICD-10-CM | POA: Diagnosis not present

## 2021-01-23 DIAGNOSIS — R296 Repeated falls: Secondary | ICD-10-CM | POA: Diagnosis not present

## 2021-01-23 DIAGNOSIS — M6281 Muscle weakness (generalized): Secondary | ICD-10-CM | POA: Diagnosis not present

## 2021-01-23 DIAGNOSIS — R2681 Unsteadiness on feet: Secondary | ICD-10-CM | POA: Diagnosis not present

## 2021-01-24 ENCOUNTER — Encounter: Payer: Self-pay | Admitting: Nurse Practitioner

## 2021-01-24 DIAGNOSIS — R296 Repeated falls: Secondary | ICD-10-CM | POA: Diagnosis not present

## 2021-01-24 DIAGNOSIS — R2681 Unsteadiness on feet: Secondary | ICD-10-CM | POA: Diagnosis not present

## 2021-01-24 DIAGNOSIS — M6281 Muscle weakness (generalized): Secondary | ICD-10-CM | POA: Diagnosis not present

## 2021-01-24 NOTE — Assessment & Plan Note (Signed)
#  2-3 Ibs in the past month, f/u dietitian, observe for fluid retention.

## 2021-01-27 DIAGNOSIS — R2681 Unsteadiness on feet: Secondary | ICD-10-CM | POA: Diagnosis not present

## 2021-01-27 DIAGNOSIS — R296 Repeated falls: Secondary | ICD-10-CM | POA: Diagnosis not present

## 2021-01-27 DIAGNOSIS — M6281 Muscle weakness (generalized): Secondary | ICD-10-CM | POA: Diagnosis not present

## 2021-01-28 DIAGNOSIS — M6281 Muscle weakness (generalized): Secondary | ICD-10-CM | POA: Diagnosis not present

## 2021-01-28 DIAGNOSIS — R296 Repeated falls: Secondary | ICD-10-CM | POA: Diagnosis not present

## 2021-01-28 DIAGNOSIS — R2681 Unsteadiness on feet: Secondary | ICD-10-CM | POA: Diagnosis not present

## 2021-01-29 DIAGNOSIS — R296 Repeated falls: Secondary | ICD-10-CM | POA: Diagnosis not present

## 2021-01-29 DIAGNOSIS — M6281 Muscle weakness (generalized): Secondary | ICD-10-CM | POA: Diagnosis not present

## 2021-01-29 DIAGNOSIS — R2681 Unsteadiness on feet: Secondary | ICD-10-CM | POA: Diagnosis not present

## 2021-02-01 DIAGNOSIS — M6281 Muscle weakness (generalized): Secondary | ICD-10-CM | POA: Diagnosis not present

## 2021-02-01 DIAGNOSIS — R296 Repeated falls: Secondary | ICD-10-CM | POA: Diagnosis not present

## 2021-02-01 DIAGNOSIS — R2681 Unsteadiness on feet: Secondary | ICD-10-CM | POA: Diagnosis not present

## 2021-02-05 ENCOUNTER — Non-Acute Institutional Stay (SKILLED_NURSING_FACILITY): Payer: Medicare Other | Admitting: Internal Medicine

## 2021-02-05 ENCOUNTER — Encounter: Payer: Self-pay | Admitting: Internal Medicine

## 2021-02-05 DIAGNOSIS — M109 Gout, unspecified: Secondary | ICD-10-CM

## 2021-02-05 DIAGNOSIS — F01B0 Vascular dementia, moderate, without behavioral disturbance, psychotic disturbance, mood disturbance, and anxiety: Secondary | ICD-10-CM

## 2021-02-05 DIAGNOSIS — R600 Localized edema: Secondary | ICD-10-CM | POA: Diagnosis not present

## 2021-02-05 DIAGNOSIS — R2681 Unsteadiness on feet: Secondary | ICD-10-CM

## 2021-02-05 DIAGNOSIS — Z8673 Personal history of transient ischemic attack (TIA), and cerebral infarction without residual deficits: Secondary | ICD-10-CM

## 2021-02-05 DIAGNOSIS — R569 Unspecified convulsions: Secondary | ICD-10-CM

## 2021-02-05 DIAGNOSIS — R296 Repeated falls: Secondary | ICD-10-CM | POA: Diagnosis not present

## 2021-02-05 DIAGNOSIS — N1831 Chronic kidney disease, stage 3a: Secondary | ICD-10-CM | POA: Diagnosis not present

## 2021-02-05 DIAGNOSIS — S22000A Wedge compression fracture of unspecified thoracic vertebra, initial encounter for closed fracture: Secondary | ICD-10-CM | POA: Diagnosis not present

## 2021-02-05 DIAGNOSIS — M6281 Muscle weakness (generalized): Secondary | ICD-10-CM | POA: Diagnosis not present

## 2021-02-05 DIAGNOSIS — E039 Hypothyroidism, unspecified: Secondary | ICD-10-CM

## 2021-02-05 NOTE — Progress Notes (Signed)
Location:   [facility] Room Number: [place] of Service:  SNF [phone]) Provider:  Veleta Miners MD  Virgie Dad, MD  Patient Care Team: Virgie Dad, MD as PCP - General (Internal Medicine) Mast, Man X, NP as Nurse Practitioner (Internal Medicine) Virgie Dad, MD as Consulting Physician (Internal Medicine) Virgie Dad, MD (Internal Medicine)  Extended Emergency Contact Information Primary Emergency Contact: Jeannine Kitten of [place] Phone: [phone] Relation: Son Secondary Emergency Contact: Petre,[name] Mobile Phone: [phone] Relation: Son  Code Status:  DNR Goals of care: Advanced Directive information Advanced Directives 02/05/2021  Does Patient Have a Medical Advance Directive? Yes  Type of Advance Directive Out of facility DNR (pink MOST or yellow form)  Does patient want to make changes to medical advance directive? No - Patient declined  Copy of [place] in Chart? -  Would patient like information on creating a medical advance directive? -  Pre-existing out of facility DNR order (yellow form or pink MOST form) Pink Most/Yellow Form available - Physician notified to receive inpatient order     Chief Complaint  Patient presents with   Medical Management of Chronic Issues   Quality Metric Gaps    Zoster Vaccines- Shingrix (1 of 2)   DEXA SCAN       HPI:  Pt is a 87 y.o. female seen today for medical management of chronic diseases.    Patient has a history of hypertension, gout, hypothyroidism and peripheral neuropathy and Osteoporosis  Cognitive impairment due to Vascular Dementia H/o Recurent Falls with CT scan of had showed new remote infarcts in Left Parietal area and Periventricular area H/o TIA Lumbar Compression Fracture at L3 and at T 5 and T 8 Cervical spine Narrowing Admitted to the hospital 9/17-9/21 for Seizure Like activity Also Nodular Density in Chest Xray Family did  not want CT scan  She is stable. No new Nursing issues. No Behavior issues Walks with her walker No Falls  She has noticed swelling in her Legs Denies any SOB or cough And Has gained weight  Wt Readings from Last 3 Encounters:  02/05/21 150 lb (68 kg)  01/21/21 143 lb 1.6 oz (64.9 kg)  12/21/20 139 lb 14.4 oz (63.5 kg)     .   Past Medical History:  Diagnosis Date   Cognitive changes    Dementia ([place])    Depression    Gout    Hypertension    Hypothyroidism    Peripheral neuropathy    History reviewed. No pertinent surgical history.  Allergies  Allergen Reactions   Actonel [Risedronate Sodium]    Actonel [Risedronate] Other (See Comments)    "Allergic," per MAR   Hct [Hydrochlorothiazide]    Hydrochlorothiazide Other (See Comments)    "Allergic," per [facility]   Lipitor [Atorvastatin Calcium]    Lipitor [Atorvastatin] Other (See Comments)    "Allergic," per MAR   Neomycin Other (See Comments)    Unknown "been so long ago, a Dermatologist told me"   Neomycin Other (See Comments)    "Allergic," per [facility]   Polysporin [Bacitracin-Polymyxin B]    Polysporin [Bacitracin-Polymyxin B] Other (See Comments)    "Allergic," per MAR   Prolia [Denosumab]    Prolia [Denosumab] Other (See Comments)   Sulfa Antibiotics    Sulfa Antibiotics Other (See Comments)    "Allergic," per MAR   Zocor [Simvastatin]    Zocor [Simvastatin] Other (See Comments)    "Allergic,"  per [facility]    Allergies as of 02/05/2021       Reactions   Actonel [risedronate Sodium]    Actonel [risedronate] Other (See Comments)   "Allergic," per MAR   Hct [hydrochlorothiazide]    Hydrochlorothiazide Other (See Comments)   "Allergic," per MAR   Lipitor [atorvastatin Calcium]    Lipitor [atorvastatin] Other (See Comments)   "Allergic," per MAR   Neomycin Other (See Comments)   Unknown "been so long ago, a Dermatologist told me"   Neomycin Other (See Comments)   "Allergic," per [facility]   Polysporin  [bacitracin-polymyxin B]    Polysporin [bacitracin-polymyxin B] Other (See Comments)   "Allergic," per MAR   Prolia [denosumab]    Prolia [denosumab] Other (See Comments)   Sulfa Antibiotics    Sulfa Antibiotics Other (See Comments)   "Allergic," per MAR   Zocor [simvastatin]    Zocor [simvastatin] Other (See Comments)   "Allergic," per [facility]        Medication List        Accurate as of February 05, 2021  3:06 PM. If you have any questions, ask your nurse or doctor.          acetaminophen 325 MG tablet Commonly known as: TYLENOL Take 650 mg by mouth in the morning, at noon, and at bedtime.   allopurinol 100 MG tablet Commonly known as: ZYLOPRIM Take 100 mg by mouth daily.   amLODipine 5 MG tablet Commonly known as: NORVASC Take 1 tablet (5 mg total) by mouth daily.   aspirin 81 MG chewable tablet Chew 81 mg by mouth daily.   benazepril 10 MG tablet Commonly known as: LOTENSIN Take 10 mg by mouth in the morning.   Calcium Carb-Cholecalciferol 500-400 MG-UNIT Tabs Take 2 tablets by mouth daily.   Co Q-10 100 MG Caps Take 200 mg by mouth in the morning.   levothyroxine 100 MCG tablet Commonly known as: SYNTHROID Take 100 mcg by mouth once a week. On Wednesday   levothyroxine 50 MCG tablet Commonly known as: SYNTHROID Take 50 mcg by mouth daily before breakfast. Sun,Mon,Tue, Thur,Fri,Sat   meclizine 12.5 MG tablet Commonly known as: ANTIVERT Take 12.5 mg by mouth 2 (two) times daily as needed for dizziness.   memantine 10 MG tablet Commonly known as: NAMENDA Take 10 mg by mouth 2 (two) times daily.   Omega III EPA+DHA 1000 MG Caps Take 2,000 mg by mouth 2 (two) times daily.   Vitamin D 50 MCG (2000 UT) Caps Take 2,000 Units by mouth daily. What changed: Another medication with the same name was removed. Continue taking this medication, and follow the directions you see here. Changed by: Virgie Dad, MD        Review of Systems   Constitutional:  Positive for unexpected weight change. Negative for activity change and appetite change.  HENT: Negative.    Respiratory:  Negative for cough and shortness of breath.   Cardiovascular:  Positive for leg swelling.  Gastrointestinal:  Negative for constipation.  Genitourinary: Negative.   Musculoskeletal:  Positive for gait problem. Negative for arthralgias and myalgias.  Skin: Negative.   Neurological:  Negative for dizziness and weakness.  Psychiatric/Behavioral:  Positive for confusion. Negative for dysphoric mood and sleep disturbance.    Immunization History  Administered Date(s) Administered   Influenza, High Dose Seasonal PF 12/11/2018   Influenza-Unspecified 03/24/2018, 12/21/2019, 12/26/2020   Moderna Sars-Covid-2 Vaccination 03/12/2019, 04/09/2019, 04/21/2019, 01/17/2020, 08/07/2020   Tdap 07/15/2019   Pertinent  Health  Maintenance Due  Topic Date Due   DEXA SCAN  Never done   INFLUENZA VACCINE  Completed   Fall Risk 11/25/2020 11/26/2020 11/26/2020 11/27/2020 11/27/2020  Patient Fall Risk Level High fall risk High fall risk High fall risk High fall risk High fall risk   Functional Status Survey:    Vitals:   02/05/21 1223  BP: (!) 156/66  Pulse: 75  Temp: (!) 97.3 F (36.3 C)  SpO2: 96%  Weight: 150 lb (68 kg)  Height: 5\' 4"  (1.626 m)   Body mass index is 25.75 kg/m. Physical Exam Vitals reviewed.  Constitutional:      Appearance: Normal appearance.  HENT:     Head: Normocephalic.     Nose: Nose normal.     Mouth/Throat:     Mouth: Mucous membranes are moist.     Pharynx: Oropharynx is clear.  Eyes:     Pupils: Pupils are equal, round, and reactive to light.  Cardiovascular:     Rate and Rhythm: Normal rate and regular rhythm.     Pulses: Normal pulses.     Heart sounds: Normal heart sounds. No murmur heard. Pulmonary:     Effort: Pulmonary effort is normal. No respiratory distress.     Breath sounds: Normal breath sounds. No rales.   Abdominal:     General: Abdomen is flat. Bowel sounds are normal.     Palpations: Abdomen is soft.  Musculoskeletal:        General: Swelling present.     Cervical back: Neck supple.     Comments: Moderate swelling bilateral  Skin:    General: Skin is warm.  Neurological:     General: No focal deficit present.     Mental Status: She is alert.  Psychiatric:        Mood and Affect: Mood normal.        Thought Content: Thought content normal.    Labs reviewed: Recent Labs    11/24/20 1322 11/24/20 1715 11/25/20 0955 11/26/20 0602  NA 141  --  141 140  K 3.0*  --  3.2* 3.8  CL 103  --  106 104  CO2 25  --  24 21*  GLUCOSE 107*  --  81 98  BUN 12  --  9 5*  CREATININE 1.37*  --  0.94 0.85  CALCIUM 9.1  --  9.0 9.2  MG  --  1.8  --   --    Recent Labs    09/25/20 0000 10/22/20 0000 11/24/20 1322  AST 15 18 22   ALT 11 11 11   ALKPHOS 101 105 87  BILITOT  --   --  0.7  PROT  --   --  6.3*  ALBUMIN 3.8 4.1 3.4*   Recent Labs    09/25/20 0000 10/22/20 0000 11/24/20 1322  WBC 8.6 8.7 12.7*  NEUTROABS 6,364.00 7,230.00 10.4*  HGB 13.1 15.2 13.8  HCT 40 46 42.3  MCV  --   --  101.7*  PLT 264 274 249   Lab Results  Component Value Date   TSH 2.34 06/22/2020   No results found for: HGBA1C No results found for: CHOL, HDL, LDLCALC, LDLDIRECT, TRIG, CHOLHDL  Significant Diagnostic Results in last 30 days:  No results found.  Assessment/Plan  1. Bilateral leg edema Will start her on Lasix 20 mg 3/week with potassium Will repeat BMP in 2 weeks 2. Hypothyroidism, unspecified type Lab Results  Component Value Date   TSH 2.34 06/22/2020  TSH normal in 4/22  3. Stage 3a chronic kidney disease (HCC) Will repeat BMP in 2 weeks  4. Gait instability Now in Skilled and walks with her walker  5. Seizure-like activity [facility]) Per Neurology Most likely post fall No Anti seizure meds right now Tramadol was discontinued  6. Moderate vascular dementia without  behavioral disturbance, psychotic disturbance, mood disturbance, or anxiety On Namenda   7. History of stroke On aspirin Allergic to Statin Family does not want anything more aggressive  8. Compression fracture of body of thoracic vertebra and Lumber vertebrae Pain seems controlled Off tramadol and Lyrica due to falls  9. Gout, unspecified cause, unspecified chronicity, unspecified site  Continue Allopurinol 10.Primary hypertension on Norvasc Already on Benazepril BP seems good controlled Not tight control due to her Falls and Dizziness 11 Abnormal chest x-[facility] [facility] Suggested CT scan of chest Family does not want any aggressive measures Family/ staff Communication:   Labs/tests ordered:  BMP in 2 weeks

## 2021-02-06 DIAGNOSIS — R2681 Unsteadiness on feet: Secondary | ICD-10-CM | POA: Diagnosis not present

## 2021-02-06 DIAGNOSIS — R296 Repeated falls: Secondary | ICD-10-CM | POA: Diagnosis not present

## 2021-02-06 DIAGNOSIS — M6281 Muscle weakness (generalized): Secondary | ICD-10-CM | POA: Diagnosis not present

## 2021-02-07 DIAGNOSIS — M6281 Muscle weakness (generalized): Secondary | ICD-10-CM | POA: Diagnosis not present

## 2021-02-07 DIAGNOSIS — R2681 Unsteadiness on feet: Secondary | ICD-10-CM | POA: Diagnosis not present

## 2021-02-07 DIAGNOSIS — R296 Repeated falls: Secondary | ICD-10-CM | POA: Diagnosis not present

## 2021-02-08 DIAGNOSIS — R296 Repeated falls: Secondary | ICD-10-CM | POA: Diagnosis not present

## 2021-02-08 DIAGNOSIS — R2681 Unsteadiness on feet: Secondary | ICD-10-CM | POA: Diagnosis not present

## 2021-02-08 DIAGNOSIS — M6281 Muscle weakness (generalized): Secondary | ICD-10-CM | POA: Diagnosis not present

## 2021-02-11 DIAGNOSIS — M6281 Muscle weakness (generalized): Secondary | ICD-10-CM | POA: Diagnosis not present

## 2021-02-11 DIAGNOSIS — R296 Repeated falls: Secondary | ICD-10-CM | POA: Diagnosis not present

## 2021-02-11 DIAGNOSIS — R2681 Unsteadiness on feet: Secondary | ICD-10-CM | POA: Diagnosis not present

## 2021-02-13 DIAGNOSIS — R2681 Unsteadiness on feet: Secondary | ICD-10-CM | POA: Diagnosis not present

## 2021-02-13 DIAGNOSIS — R296 Repeated falls: Secondary | ICD-10-CM | POA: Diagnosis not present

## 2021-02-13 DIAGNOSIS — M6281 Muscle weakness (generalized): Secondary | ICD-10-CM | POA: Diagnosis not present

## 2021-02-14 DIAGNOSIS — R2681 Unsteadiness on feet: Secondary | ICD-10-CM | POA: Diagnosis not present

## 2021-02-14 DIAGNOSIS — R296 Repeated falls: Secondary | ICD-10-CM | POA: Diagnosis not present

## 2021-02-14 DIAGNOSIS — M6281 Muscle weakness (generalized): Secondary | ICD-10-CM | POA: Diagnosis not present

## 2021-02-15 ENCOUNTER — Non-Acute Institutional Stay (SKILLED_NURSING_FACILITY): Payer: Medicare Other | Admitting: Nurse Practitioner

## 2021-02-15 ENCOUNTER — Encounter: Payer: Self-pay | Admitting: Nurse Practitioner

## 2021-02-15 DIAGNOSIS — R609 Edema, unspecified: Secondary | ICD-10-CM

## 2021-02-15 DIAGNOSIS — R2681 Unsteadiness on feet: Secondary | ICD-10-CM | POA: Diagnosis not present

## 2021-02-15 DIAGNOSIS — M5030 Other cervical disc degeneration, unspecified cervical region: Secondary | ICD-10-CM | POA: Diagnosis not present

## 2021-02-15 DIAGNOSIS — Z8673 Personal history of transient ischemic attack (TIA), and cerebral infarction without residual deficits: Secondary | ICD-10-CM | POA: Diagnosis not present

## 2021-02-15 DIAGNOSIS — N1831 Chronic kidney disease, stage 3a: Secondary | ICD-10-CM | POA: Diagnosis not present

## 2021-02-15 DIAGNOSIS — G609 Hereditary and idiopathic neuropathy, unspecified: Secondary | ICD-10-CM | POA: Diagnosis not present

## 2021-02-15 DIAGNOSIS — I10 Essential (primary) hypertension: Secondary | ICD-10-CM | POA: Diagnosis not present

## 2021-02-15 DIAGNOSIS — M549 Dorsalgia, unspecified: Secondary | ICD-10-CM

## 2021-02-15 DIAGNOSIS — G459 Transient cerebral ischemic attack, unspecified: Secondary | ICD-10-CM | POA: Diagnosis not present

## 2021-02-15 DIAGNOSIS — M6281 Muscle weakness (generalized): Secondary | ICD-10-CM | POA: Diagnosis not present

## 2021-02-15 DIAGNOSIS — R569 Unspecified convulsions: Secondary | ICD-10-CM | POA: Diagnosis not present

## 2021-02-15 DIAGNOSIS — F01B0 Vascular dementia, moderate, without behavioral disturbance, psychotic disturbance, mood disturbance, and anxiety: Secondary | ICD-10-CM

## 2021-02-15 DIAGNOSIS — M109 Gout, unspecified: Secondary | ICD-10-CM | POA: Diagnosis not present

## 2021-02-15 DIAGNOSIS — R296 Repeated falls: Secondary | ICD-10-CM | POA: Diagnosis not present

## 2021-02-15 DIAGNOSIS — E039 Hypothyroidism, unspecified: Secondary | ICD-10-CM

## 2021-02-15 NOTE — Assessment & Plan Note (Signed)
Vascular dementia, MMSE dropped from 01/02/19 29/30 to11/3/21 MMSE 18/30, failed clock drawing. 07/15/19 CT head Mild generalized parenchymal atrophy and chronic small vessel ischemic disease, tolerated Memantine well.

## 2021-02-15 NOTE — Assessment & Plan Note (Signed)
No flare ups, on Allopurinol 100mg qd.      

## 2021-02-15 NOTE — Assessment & Plan Note (Signed)
T5, T8, L3 compression fxs. Takes, Tylenol for pain, off Tramadol and Lyrica.

## 2021-02-15 NOTE — Assessment & Plan Note (Signed)
stable, on Levothyroxine. TSH 2.34 06/22/20

## 2021-02-15 NOTE — Assessment & Plan Note (Signed)
per CT head/cervical spine 08/02/20

## 2021-02-15 NOTE — Assessment & Plan Note (Addendum)
blood pressure 116/94, ? Accuracy,  the patient is observed in her usual stated of health,  on Benazepril, Amlodipine, will continue to observe Bp

## 2021-02-15 NOTE — Progress Notes (Signed)
Location:   [facility] Room Number: [place] of Service:  SNF (31) Provider:  Tilla Wilborn X, NP  Virgie Dad, MD  Patient Care Team: Virgie Dad, MD as PCP - General (Internal Medicine) Leng Montesdeoca X, NP as Nurse Practitioner (Internal Medicine) Virgie Dad, MD as Consulting Physician (Internal Medicine) Virgie Dad, MD (Internal Medicine)  Extended Emergency Contact Information Primary Emergency Contact: Jeannine Kitten of [place] Phone: [phone] Relation: Son Secondary Emergency Contact: Goshert,[name] Mobile Phone: [phone] Relation: Son  Code Status:  DNR Goals of care: Advanced Directive information Advanced Directives 02/15/2021  Does Patient Have a Medical Advance Directive? Yes  Type of Advance Directive Out of facility DNR (pink MOST or yellow form)  Does patient want to make changes to medical advance directive? No - Patient declined  Copy of [place] in Chart? -  Would patient like information on creating a medical advance directive? -  Pre-existing out of facility DNR order (yellow form or pink MOST form) Pink Most/Yellow Form available - Physician notified to receive inpatient order     Chief Complaint  Patient presents with   Acute Visit    Acute Visit for Burning sensation in feet.    HPI:  Pt is a 87 y.o. female seen today for an acute visit for c/o burning left feet, no open wound, weak DP pulses R+L.   Edema BLE Furosemide 3x/wk, f/u BMP 2 weeks since 02/05/21   Gait abnormality, uses walker to ambulate.              Hx of TIA. Allergic to statin. On ASA.  HPOA desired not hospital evaluation Seizure like activity, likely post fall, off Tramadol, not taking anti seizure meds.               T5, T8, L3 compression fxs. Takes, Tylenol for pain, off Tramadol and Lyrica.              DDD per CT head/cervical spine 08/02/20              Hx of CVA per CT head 08/02/20 Remote infarcts  within the a occipital cortices bilaterally again noted. Interval development of remote infarcts within the left parietal lobe and periventricular white matte. On ASA             Vascular dementia, MMSE dropped from 01/02/19 29/30 to11/3/21 MMSE 18/30, failed clock drawing. 07/15/19 CT head Mild generalized parenchymal atrophy and chronic small vessel ischemic disease, tolerated Memantine well.                          HTN, blood pressure is controlled on Benazepril, Amlodipine.              CKD Bun/creat 5/0.85 11/26/20             Gout stable, on Allopurinol 100mg  qd.                        Hypothyroidism, stable, on Levothyroxine. TSH 2.34 06/22/20             Abnormal CXR, suggested CT chest, not candidate for workup  Past Medical History:  Diagnosis Date   Cognitive changes    Dementia ([place])    Depression    Gout    Hypertension    Hypothyroidism    Peripheral neuropathy    History  reviewed. No pertinent surgical history.  Allergies  Allergen Reactions   Actonel [Risedronate Sodium]    Actonel [Risedronate] Other (See Comments)    "Allergic," per [facility]   Hct [Hydrochlorothiazide]    Hydrochlorothiazide Other (See Comments)    "Allergic," per [facility]   Lipitor [Atorvastatin Calcium]    Lipitor [Atorvastatin] Other (See Comments)    "Allergic," per [facility]   Neomycin Other (See Comments)    Unknown "been so long ago, a Dermatologist told me"   Neomycin Other (See Comments)    "Allergic," per [facility]   Polysporin [Bacitracin-Polymyxin B]    Polysporin [Bacitracin-Polymyxin B] Other (See Comments)    "Allergic," per MAR   Prolia [Denosumab]    Prolia [Denosumab] Other (See Comments)   Sulfa Antibiotics    Sulfa Antibiotics Other (See Comments)    "Allergic," per MAR   Zocor [Simvastatin]    Zocor [Simvastatin] Other (See Comments)    "Allergic," per [facility]    Allergies as of 02/15/2021       Reactions   Actonel [risedronate Sodium]    Actonel [risedronate] Other (See Comments)    "Allergic," per MAR   Hct [hydrochlorothiazide]    Hydrochlorothiazide Other (See Comments)   "Allergic," per [facility]   Lipitor [atorvastatin Calcium]    Lipitor [atorvastatin] Other (See Comments)   "Allergic," per [facility]   Neomycin Other (See Comments)   Unknown "been so long ago, a Dermatologist told me"   Neomycin Other (See Comments)   "Allergic," per [facility]   Polysporin [bacitracin-polymyxin B]    Polysporin [bacitracin-polymyxin B] Other (See Comments)   "Allergic," per [facility]   Prolia [denosumab]    Prolia [denosumab] Other (See Comments)   Sulfa Antibiotics    Sulfa Antibiotics Other (See Comments)   "Allergic," per [facility]   Zocor [simvastatin]    Zocor [simvastatin] Other (See Comments)   "Allergic," per [facility]        Medication List        Accurate as of February 15, 2021 11:59 PM. If you have any questions, ask your nurse or doctor.          acetaminophen 325 MG tablet Commonly known as: TYLENOL Take 650 mg by mouth in the morning, at noon, and at bedtime.   allopurinol 100 MG tablet Commonly known as: ZYLOPRIM Take 100 mg by mouth daily.   amLODipine 5 MG tablet Commonly known as: NORVASC Take 1 tablet (5 mg total) by mouth daily.   aspirin 81 MG chewable tablet Chew 81 mg by mouth daily.   benazepril 10 MG tablet Commonly known as: LOTENSIN Take 10 mg by mouth in the morning.   Calcium Carb-Cholecalciferol 500-400 MG-UNIT Tabs Take 2 tablets by mouth daily.   Co Q-10 100 MG Caps Take 200 mg by mouth in the morning.   furosemide 20 MG tablet Commonly known as: LASIX Take 20 mg by mouth. Once A Day on Mon, Wed, Fri   levothyroxine 100 MCG tablet Commonly known as: SYNTHROID Take 100 mcg by mouth once a week. On Wednesday   levothyroxine 50 MCG tablet Commonly known as: SYNTHROID Take 50 mcg by mouth daily before breakfast. Sun,Mon,Tue, Thur,Fri,Sat   meclizine 12.5 MG tablet Commonly known as: ANTIVERT Take 12.5 mg by mouth 2 (two) times daily as  needed for dizziness.   memantine 10 MG tablet Commonly known as: NAMENDA Take 10 mg by mouth 2 (two) times daily.   Omega III EPA+DHA 1000 MG Caps Take 2,000 mg by mouth 2 (  two) times daily.   potassium chloride SA 20 MEQ tablet Commonly known as: KLOR-CON M Take 20 mEq by mouth. Once A Day on Mon, Wed, Fri   Vitamin D 50 MCG (2000 UT) Caps Take 2,000 Units by mouth daily.        Review of Systems  Unable to perform ROS: Dementia   Immunization History  Administered Date(s) Administered   Influenza, High Dose Seasonal PF 12/11/2018   Influenza-Unspecified 03/24/2018, 12/21/2019, 12/26/2020   Moderna Sars-Covid-2 Vaccination 03/12/2019, 04/09/2019, 04/21/2019, 01/17/2020, 08/07/2020   Pneumococcal Conjugate-13 09/09/2012, 10/28/2013   Pneumococcal Polysaccharide-23 04/02/2009, 04/16/2009   Tdap 07/15/2019   Zoster, Live 04/16/2009   Pertinent  Health Maintenance Due  Topic Date Due   DEXA SCAN  Never done   INFLUENZA VACCINE  Completed   Fall Risk 11/25/2020 11/26/2020 11/26/2020 11/27/2020 11/27/2020  Patient Fall Risk Level High fall risk High fall risk High fall risk High fall risk High fall risk   Functional Status Survey:    Vitals:   02/15/21 0822  BP: (!) 116/94  Pulse: 79  Resp: 18  Temp: 97.8 F (36.6 C)  SpO2: 98%  Weight: 142 lb 4.8 oz (64.5 kg)  Height: 5\' 4"  (1.626 m)   Body mass index is 24.43 kg/m. Physical Exam Vitals and nursing note reviewed.  Constitutional:      Appearance: Normal appearance.     Comments: Weight gained about #4Ibs in the past month.   HENT:     Head: Normocephalic and atraumatic.     Mouth/Throat:     Mouth: Mucous membranes are moist.  Eyes:     Extraocular Movements: Extraocular movements intact.     Conjunctiva/sclera: Conjunctivae normal.     Pupils: Pupils are equal, round, and reactive to light.  Cardiovascular:     Rate and Rhythm: Normal rate and regular rhythm.     Heart sounds: No murmur heard.     Comments: Weak dorsalis pedis pulses R+L Pulmonary:     Effort: Pulmonary effort is normal.     Breath sounds: No rales.  Abdominal:     General: Bowel sounds are normal.     Palpations: Abdomen is soft.     Tenderness: There is no abdominal tenderness.  Musculoskeletal:        General: Tenderness present.     Cervical back: Normal range of motion and neck supple.     Right lower leg: Edema present.     Left lower leg: Edema present.     Comments: C/o lower portion of the mid back pain with position changing. Trace edema BLE-mostly in feet/ankles-trace to 1+  Skin:    General: Skin is warm and dry.  Neurological:     General: No focal deficit present.     Mental Status: She is alert. Mental status is at baseline.     Motor: No weakness.     Coordination: Coordination normal.     Gait: Gait abnormal.  Psychiatric:        Mood and Affect: Mood normal.        Behavior: Behavior normal.    Labs reviewed: Recent Labs    11/24/20 1322 11/24/20 1715 11/25/20 0955 11/26/20 0602  NA 141  --  141 140  K 3.0*  --  3.2* 3.8  CL 103  --  106 104  CO2 25  --  24 21*  GLUCOSE 107*  --  81 98  BUN 12  --  9 5*  CREATININE  1.37*  --  0.94 0.85  CALCIUM 9.1  --  9.0 9.2  MG  --  1.8  --   --    Recent Labs    09/25/20 0000 10/22/20 0000 11/24/20 1322  AST 15 18 22   ALT 11 11 11   ALKPHOS 101 105 87  BILITOT  --   --  0.7  PROT  --   --  6.3*  ALBUMIN 3.8 4.1 3.4*   Recent Labs    09/25/20 0000 10/22/20 0000 11/24/20 1322  WBC 8.6 8.7 12.7*  NEUTROABS 6,364.00 7,230.00 10.4*  HGB 13.1 15.2 13.8  HCT 40 46 42.3  MCV  --   --  101.7*  PLT 264 274 249   Lab Results  Component Value Date   TSH 2.34 06/22/2020   No results found for: HGBA1C No results found for: CHOL, HDL, LDLCALC, LDLDIRECT, TRIG, CHOLHDL  Significant Diagnostic Results in last 30 days:  No results found.  Assessment/Plan Peripheral neuropathy c/o burning left feet, no open wound, weak DP  pulses R+L. Off Tramadol, Lyrica 2/2 to fall. Will try Gabapentin 100mg  qhs for now.   Edema, peripheral Tract to 1+ edema BLE, Furosemide 3x/wk, f/u BMP 2 weeks since 02/05/21  Gait instability uses walker to ambulate  TIA (transient ischemic attack) Allergic to statin. On ASA.  HPOA desired not hospital evaluation  Seizure-like activity ([place]) Seizure like activity, likely post fall, off Tramadol, not taking anti seizure meds.   Mid back pain T5, T8, L3 compression fxs. Takes, Tylenol for pain, off Tramadol and Lyrica.   DDD (degenerative disc disease), cervical per CT head/cervical spine 08/02/20   History of CVA (cerebrovascular accident) Hx of CVA per CT head 08/02/20 Remote infarcts within the a occipital cortices bilaterally again noted. Interval development of remote infarcts within the left parietal lobe and periventricular white matte. On ASA  Vascular dementia ([place]) Vascular dementia, MMSE dropped from 01/02/19 29/30 to11/3/21 MMSE 18/30, failed clock drawing. 07/15/19 CT head Mild generalized parenchymal atrophy and chronic small vessel ischemic disease, tolerated Memantine well.     HTN (hypertension) blood pressure 116/94, ? Accuracy,  the patient is observed in her usual stated of health,  on Benazepril, Amlodipine, will continue to observe Bp  CKD (chronic kidney disease) stage 3, GFR 30-59 ml/min (HCC) Bun/creat 5/0.85 11/26/20  Gout No flare ups,  on Allopurinol 100mg  qd.      Hypothyroidism  stable, on Levothyroxine. TSH 2.34 06/22/20    Family/ staff Communication: plan of care reviewed with the patient and charge nurse.   Labs/tests ordered:   none  Time spend 35 minutes

## 2021-02-15 NOTE — Assessment & Plan Note (Signed)
Tract to 1+ edema BLE, Furosemide 3x/wk, f/u BMP 2 weeks since 02/05/21

## 2021-02-15 NOTE — Progress Notes (Deleted)
Location:   [place] Room Number: [place] of Service:  SNF (31) Provider:  mast, Man Severiano Gilbert, MD  Patient Care Team: Virgie Dad, MD as PCP - General (Internal Medicine) Mast, Man X, NP as Nurse Practitioner (Internal Medicine) Virgie Dad, MD as Consulting Physician (Internal Medicine) Virgie Dad, MD (Internal Medicine)  Extended Emergency Contact Information Primary Emergency Contact: Jeannine Kitten of [place] Phone: [phone] Relation: Son Secondary Emergency Contact: Arakelian,[name] Mobile Phone: [phone] Relation: Son  Code Status:  DNR Goals of care: Advanced Directive information Advanced Directives 02/15/2021  Does Patient Have a Medical Advance Directive? Yes  Type of Advance Directive Out of facility DNR (pink MOST or yellow form)  Does patient want to make changes to medical advance directive? No - Patient declined  Copy of [place] in Chart? -  Would patient like information on creating a medical advance directive? -  Pre-existing out of facility DNR order (yellow form or pink MOST form) Pink Most/Yellow Form available - Physician notified to receive inpatient order     Chief Complaint  Patient presents with   Acute Visit    Acute Visit for Burning sensation in feet.    HPI:  Pt is a 87 y.o. female seen today for medical management of chronic diseases.     Past Medical History:  Diagnosis Date   Cognitive changes    Dementia ([place])    Depression    Gout    Hypertension    Hypothyroidism    Peripheral neuropathy    History reviewed. No pertinent surgical history.  Allergies  Allergen Reactions   Actonel [Risedronate Sodium]    Actonel [Risedronate] Other (See Comments)    "Allergic," per [facility]   Hct [Hydrochlorothiazide]    Hydrochlorothiazide Other (See Comments)    "Allergic," per [facility]   Lipitor [Atorvastatin Calcium]    Lipitor [Atorvastatin] Other (See  Comments)    "Allergic," per [facility]   Neomycin Other (See Comments)    Unknown "been so long ago, a Dermatologist told me"   Neomycin Other (See Comments)    "Allergic," per [facility]   Polysporin [Bacitracin-Polymyxin B]    Polysporin [Bacitracin-Polymyxin B] Other (See Comments)    "Allergic," per [facility]   Prolia [Denosumab]    Prolia [Denosumab] Other (See Comments)   Sulfa Antibiotics    Sulfa Antibiotics Other (See Comments)    "Allergic," per [facility]   Zocor [Simvastatin]    Zocor [Simvastatin] Other (See Comments)    "Allergic," per [facility]    Allergies as of 02/15/2021       Reactions   Actonel [risedronate Sodium]    Actonel [risedronate] Other (See Comments)   "Allergic," per [facility]   Hct [hydrochlorothiazide]    Hydrochlorothiazide Other (See Comments)   "Allergic," per [facility]   Lipitor [atorvastatin Calcium]    Lipitor [atorvastatin] Other (See Comments)   "Allergic," per [facility]   Neomycin Other (See Comments)   Unknown "been so long ago, a Dermatologist told me"   Neomycin Other (See Comments)   "Allergic," per [facility]   Polysporin [bacitracin-polymyxin B]    Polysporin [bacitracin-polymyxin B] Other (See Comments)   "Allergic," per [facility]   Prolia [denosumab]    Prolia [denosumab] Other (See Comments)   Sulfa Antibiotics    Sulfa Antibiotics Other (See Comments)   "Allergic," per MAR   Zocor [simvastatin]    Zocor [simvastatin] Other (See Comments)   "Allergic,"  per [facility]        Medication List        Accurate as of February 15, 2021  8:41 AM. If you have any questions, ask your nurse or doctor.          acetaminophen 325 MG tablet Commonly known as: TYLENOL Take 650 mg by mouth in the morning, at noon, and at bedtime.   allopurinol 100 MG tablet Commonly known as: ZYLOPRIM Take 100 mg by mouth daily.   amLODipine 5 MG tablet Commonly known as: NORVASC Take 1 tablet (5 mg total) by mouth daily.   aspirin 81 MG chewable tablet Chew 81 mg by mouth daily.   benazepril 10  MG tablet Commonly known as: LOTENSIN Take 10 mg by mouth in the morning.   Calcium Carb-Cholecalciferol 500-400 MG-UNIT Tabs Take 2 tablets by mouth daily.   Co Q-10 100 MG Caps Take 200 mg by mouth in the morning.   furosemide 20 MG tablet Commonly known as: LASIX Take 20 mg by mouth. Once A Day on Mon, Wed, Fri   levothyroxine 100 MCG tablet Commonly known as: SYNTHROID Take 100 mcg by mouth once a week. On Wednesday   levothyroxine 50 MCG tablet Commonly known as: SYNTHROID Take 50 mcg by mouth daily before breakfast. Sun,Mon,Tue, Thur,Fri,Sat   meclizine 12.5 MG tablet Commonly known as: ANTIVERT Take 12.5 mg by mouth 2 (two) times daily as needed for dizziness.   memantine 10 MG tablet Commonly known as: NAMENDA Take 10 mg by mouth 2 (two) times daily.   Omega III EPA+DHA 1000 MG Caps Take 2,000 mg by mouth 2 (two) times daily.   potassium chloride SA 20 MEQ tablet Commonly known as: KLOR-CON M Take 20 mEq by mouth. Once A Day on Mon, Wed, Fri   Vitamin D 50 MCG (2000 UT) Caps Take 2,000 Units by mouth daily.        Review of Systems  Immunization History  Administered Date(s) Administered   Influenza, High Dose Seasonal PF 12/11/2018   Influenza-Unspecified 03/24/2018, 12/21/2019, 12/26/2020   Moderna Sars-Covid-2 Vaccination 03/12/2019, 04/09/2019, 04/21/2019, 01/17/2020, 08/07/2020   Pneumococcal Conjugate-13 09/09/2012, 10/28/2013   Pneumococcal Polysaccharide-23 04/02/2009, 04/16/2009   Tdap 07/15/2019   Zoster, Live 04/16/2009   Pertinent  Health Maintenance Due  Topic Date Due   DEXA SCAN  Never done   INFLUENZA VACCINE  Completed   Fall Risk 11/25/2020 11/26/2020 11/26/2020 11/27/2020 11/27/2020  Patient Fall Risk Level High fall risk High fall risk High fall risk High fall risk High fall risk   Functional Status Survey:    Vitals:   02/15/21 0822  BP: (!) 116/94  Pulse: 79  Resp: 18  Temp: 97.8 F (36.6 C)  SpO2: 98%  Weight: 142 lb  4.8 oz (64.5 kg)  Height: 5\' 4"  (1.626 m)   Body mass index is 24.43 kg/m. Physical Exam  Labs reviewed: Recent Labs    11/24/20 1322 11/24/20 1715 11/25/20 0955 11/26/20 0602  NA 141  --  141 140  K 3.0*  --  3.2* 3.8  CL 103  --  106 104  CO2 25  --  24 21*  GLUCOSE 107*  --  81 98  BUN 12  --  9 5*  CREATININE 1.37*  --  0.94 0.85  CALCIUM 9.1  --  9.0 9.2  MG  --  1.8  --   --    Recent Labs    09/25/20 0000 10/22/20 0000 11/24/20 1322  AST 15 18 22   ALT 11 11 11   ALKPHOS 101 105 87  BILITOT  --   --  0.7  PROT  --   --  6.3*  ALBUMIN 3.8 4.1 3.4*   Recent Labs    09/25/20 0000 10/22/20 0000 11/24/20 1322  WBC 8.6 8.7 12.7*  NEUTROABS 6,364.00 7,230.00 10.4*  HGB 13.1 15.2 13.8  HCT 40 46 42.3  MCV  --   --  101.7*  PLT 264 274 249   Lab Results  Component Value Date   TSH 2.34 06/22/2020   No results found for: HGBA1C No results found for: CHOL, HDL, LDLCALC, LDLDIRECT, TRIG, CHOLHDL  Significant Diagnostic Results in last 30 days:  No results found.  Assessment/Plan There are no diagnoses linked to this encounter.   Family/ staff Communication:   Labs/tests ordered:

## 2021-02-15 NOTE — Assessment & Plan Note (Signed)
Hx of CVA per CT head 08/02/20 Remote infarcts within the a occipital cortices bilaterally again noted. Interval development of remote infarcts within the left parietal lobe and periventricular white matte. On ASA 

## 2021-02-15 NOTE — Assessment & Plan Note (Signed)
c/o burning left feet, no open wound, weak DP pulses R+L. Off Tramadol, Lyrica 2/2 to fall. Will try Gabapentin 100mg  qhs for now.

## 2021-02-15 NOTE — Assessment & Plan Note (Signed)
Allergic to statin. On ASA.  HPOA desired not hospital evaluation

## 2021-02-15 NOTE — Assessment & Plan Note (Signed)
uses walker to ambulate.       

## 2021-02-15 NOTE — Assessment & Plan Note (Signed)
Seizure like activity, likely post fall, off Tramadol, not taking anti seizure meds.  

## 2021-02-15 NOTE — Assessment & Plan Note (Signed)
Bun/creat 5/0.85 11/26/20

## 2021-02-19 DIAGNOSIS — I10 Essential (primary) hypertension: Secondary | ICD-10-CM | POA: Diagnosis not present

## 2021-02-20 LAB — BASIC METABOLIC PANEL
BUN: 27 — AB (ref 4–21)
Chloride: 108 (ref 99–108)
Creatinine: 1.2 — AB (ref 0.5–1.1)
Glucose: 71
Potassium: 3.8 (ref 3.4–5.3)
Sodium: 143 (ref 137–147)

## 2021-02-20 LAB — HEPATIC FUNCTION PANEL
ALT: 8 (ref 7–35)
AST: 12 — AB (ref 13–35)
Alkaline Phosphatase: 79 (ref 25–125)
Bilirubin, Total: 0.3

## 2021-02-20 LAB — CBC AND DIFFERENTIAL
HCT: 35 — AB (ref 36–46)
Hemoglobin: 11.8 — AB (ref 12.0–16.0)
Neutrophils Absolute: 4918
Platelets: 208 (ref 150–399)
WBC: 7.2

## 2021-02-20 LAB — COMPREHENSIVE METABOLIC PANEL
Albumin: 3.5 (ref 3.5–5.0)
Calcium: 26 — AB (ref 8.7–10.7)
Globulin: 2.3

## 2021-02-20 LAB — CBC: RBC: 3.58 — AB (ref 3.87–5.11)

## 2021-03-15 ENCOUNTER — Non-Acute Institutional Stay (SKILLED_NURSING_FACILITY): Payer: Medicare Other | Admitting: Nurse Practitioner

## 2021-03-15 ENCOUNTER — Encounter: Payer: Self-pay | Admitting: Nurse Practitioner

## 2021-03-15 DIAGNOSIS — G459 Transient cerebral ischemic attack, unspecified: Secondary | ICD-10-CM | POA: Diagnosis not present

## 2021-03-15 DIAGNOSIS — F01B0 Vascular dementia, moderate, without behavioral disturbance, psychotic disturbance, mood disturbance, and anxiety: Secondary | ICD-10-CM

## 2021-03-15 DIAGNOSIS — N1831 Chronic kidney disease, stage 3a: Secondary | ICD-10-CM

## 2021-03-15 DIAGNOSIS — I10 Essential (primary) hypertension: Secondary | ICD-10-CM

## 2021-03-15 DIAGNOSIS — R609 Edema, unspecified: Secondary | ICD-10-CM

## 2021-03-15 DIAGNOSIS — M159 Polyosteoarthritis, unspecified: Secondary | ICD-10-CM

## 2021-03-15 DIAGNOSIS — R569 Unspecified convulsions: Secondary | ICD-10-CM

## 2021-03-15 DIAGNOSIS — R2681 Unsteadiness on feet: Secondary | ICD-10-CM | POA: Diagnosis not present

## 2021-03-15 DIAGNOSIS — E039 Hypothyroidism, unspecified: Secondary | ICD-10-CM

## 2021-03-15 DIAGNOSIS — Z8673 Personal history of transient ischemic attack (TIA), and cerebral infarction without residual deficits: Secondary | ICD-10-CM | POA: Diagnosis not present

## 2021-03-15 DIAGNOSIS — M109 Gout, unspecified: Secondary | ICD-10-CM | POA: Diagnosis not present

## 2021-03-15 NOTE — Assessment & Plan Note (Signed)
Hx of TIA. Allergic to statin. On ASA.  HPOA desired not hospital evaluation. Hgb 11.8 02/20/21

## 2021-03-15 NOTE — Assessment & Plan Note (Signed)
blood pressure is controlled on Benazepril, Amlodipine.  

## 2021-03-15 NOTE — Progress Notes (Signed)
Location:   [place] Room Number: 31 Place of Service:  SNF (31) Provider:  Marlana Latus, NP  Virgie Dad, MD  Patient Care Team: Virgie Dad, MD as PCP - General (Internal Medicine) Krisann Mckenna X, NP as Nurse Practitioner (Internal Medicine) Virgie Dad, MD as Consulting Physician (Internal Medicine) Virgie Dad, MD (Internal Medicine)  Extended Emergency Contact Information Primary Emergency Contact: Jeannine Kitten of [place] Phone: [phone] Relation: Son Secondary Emergency Contact: Stroble,[name] Mobile Phone: [phone] Relation: Son  Code Status:  DNR Goals of care: Advanced Directive information Advanced Directives 03/15/2021  Does Patient Have a Medical Advance Directive? Yes  Type of Paramedic of [place];Out of facility DNR (pink MOST or yellow form)  Does patient want to make changes to medical advance directive? No - Patient declined  Copy of [place] in Chart? Yes - validated most recent copy scanned in chart (See row information)  Would patient like information on creating a medical advance directive? -  Pre-existing out of facility DNR order (yellow form or pink MOST form) Pink MOST form placed in chart (order not valid for inpatient use)     Chief Complaint  Patient presents with   Medical Management of Chronic Issues    Routine follow up visit.   Health Maintenance    Zoster vaccine, dexa scan    HPI:  Pt is a 88 y.o. female seen today for medical management of chronic diseases.     Edema BLE Furosemide 3x/wk              Gait abnormality, uses walker to ambulate.              Hx of TIA. Allergic to statin. On ASA.  HPOA desired not hospital evaluation. Hgb 11.8 02/20/21 Seizure like activity, likely post fall, off Tramadol, not taking anti seizure meds.               T5, T8, L3 compression fxs. Takes, Tylenol for pain, off Tramadol and Lyrica.               DDD per CT head/cervical spine 08/02/20              Hx of CVA per CT head 08/02/20 Remote infarcts within the a occipital cortices bilaterally again noted. Interval development of remote infarcts within the left parietal lobe and periventricular white matte. On ASA             Vascular dementia, MMSE dropped from 01/02/19 29/30 to11/3/21 MMSE 18/30, failed clock drawing. 07/15/19 CT head Mild generalized parenchymal atrophy and chronic small vessel ischemic disease, tolerated Memantine well.                          HTN, blood pressure is controlled on Benazepril, Amlodipine.              CKD Bun/creat 27/1.2 02/20/21             Gout stable, on Allopurinol 100mg  qd.                        Hypothyroidism, stable, on Levothyroxine. TSH 2.34 06/22/20             Abnormal CXR, suggested CT chest, not candidate for workup     Past Medical History:  Diagnosis Date   Cognitive changes  Dementia ([place])    Depression    Gout    Hypertension    Hypothyroidism    Peripheral neuropathy    History reviewed. No pertinent surgical history.  Allergies  Allergen Reactions   Actonel [Risedronate Sodium]    Actonel [Risedronate] Other (See Comments)    "Allergic," per [facility]   Hct [Hydrochlorothiazide]    Hydrochlorothiazide Other (See Comments)    "Allergic," per [facility]   Lipitor [Atorvastatin Calcium]    Lipitor [Atorvastatin] Other (See Comments)    "Allergic," per MAR   Neomycin Other (See Comments)    Unknown "been so long ago, a Dermatologist told me"   Neomycin Other (See Comments)    "Allergic," per [facility]   Polysporin [Bacitracin-Polymyxin B]    Polysporin [Bacitracin-Polymyxin B] Other (See Comments)    "Allergic," per MAR   Prolia [Denosumab]    Prolia [Denosumab] Other (See Comments)   Sulfa Antibiotics    Sulfa Antibiotics Other (See Comments)    "Allergic," per MAR   Zocor [Simvastatin]    Zocor [Simvastatin] Other (See Comments)    "Allergic," per MAR    Allergies as of  03/15/2021       Reactions   Actonel [risedronate Sodium]    Actonel [risedronate] Other (See Comments)   "Allergic," per MAR   Hct [hydrochlorothiazide]    Hydrochlorothiazide Other (See Comments)   "Allergic," per MAR   Lipitor [atorvastatin Calcium]    Lipitor [atorvastatin] Other (See Comments)   "Allergic," per [facility]   Neomycin Other (See Comments)   Unknown "been so long ago, a Dermatologist told me"   Neomycin Other (See Comments)   "Allergic," per [facility]   Polysporin [bacitracin-polymyxin B]    Polysporin [bacitracin-polymyxin B] Other (See Comments)   "Allergic," per [facility]   Prolia [denosumab]    Prolia [denosumab] Other (See Comments)   Sulfa Antibiotics    Sulfa Antibiotics Other (See Comments)   "Allergic," per [facility]   Zocor [simvastatin]    Zocor [simvastatin] Other (See Comments)   "Allergic," per [facility]        Medication List        Accurate as of March 15, 2021 11:59 PM. If you have any questions, ask your nurse or doctor.          acetaminophen 325 MG tablet Commonly known as: TYLENOL Take 650 mg by mouth in the morning, at noon, and at bedtime.   allopurinol 100 MG tablet Commonly known as: ZYLOPRIM Take 100 mg by mouth daily.   amLODipine 5 MG tablet Commonly known as: NORVASC Take 1 tablet (5 mg total) by mouth daily.   aspirin 81 MG chewable tablet Chew 81 mg by mouth daily.   benazepril 10 MG tablet Commonly known as: LOTENSIN Take 10 mg by mouth in the morning.   Calcium Carb-Cholecalciferol 500-400 MG-UNIT Tabs Take 2 tablets by mouth daily.   Co Q-10 100 MG Caps Take 200 mg by mouth in the morning.   furosemide 20 MG tablet Commonly known as: LASIX Take 20 mg by mouth. Once A Day on Mon, Wed, Fri   gabapentin 100 MG capsule Commonly known as: NEURONTIN Take 100 mg by mouth at bedtime.   levothyroxine 100 MCG tablet Commonly known as: SYNTHROID Take 100 mcg by mouth once a week. On Wednesday   levothyroxine 50 MCG  tablet Commonly known as: SYNTHROID Take 50 mcg by mouth daily before breakfast. Sun,Mon,Tue, Thur,Fri,Sat   meclizine 12.5 MG tablet Commonly known as: ANTIVERT Take 12.5  mg by mouth 2 (two) times daily as needed for dizziness.   memantine 10 MG tablet Commonly known as: NAMENDA Take 10 mg by mouth 2 (two) times daily.   Omega III EPA+DHA 1000 MG Caps Take 2,000 mg by mouth 2 (two) times daily.   Vitamin D 50 MCG (2000 UT) Caps Take 2,000 Units by mouth daily.        Review of Systems  Unable to perform ROS: Dementia   Immunization History  Administered Date(s) Administered   Influenza, High Dose Seasonal PF 12/11/2018   Influenza-Unspecified 03/24/2018, 12/21/2019, 12/26/2020   Moderna Sars-Covid-2 Vaccination 03/12/2019, 04/09/2019, 04/21/2019, 01/17/2020, 08/07/2020   Pneumococcal Conjugate-13 09/09/2012, 10/28/2013   Pneumococcal Polysaccharide-23 04/02/2009, 04/16/2009   Tdap 07/15/2019   Zoster, Live 04/16/2009   Pertinent  Health Maintenance Due  Topic Date Due   DEXA SCAN  Never done   INFLUENZA VACCINE  Completed   Fall Risk 11/25/2020 11/26/2020 11/26/2020 11/27/2020 11/27/2020  Patient Fall Risk Level High fall risk High fall risk High fall risk High fall risk High fall risk   Functional Status Survey:    Vitals:   03/15/21 0842  BP: 137/70  Pulse: 72  Temp: 97.7 F (36.5 C)  SpO2: 95%  Weight: 146 lb 3.2 oz (66.3 kg)  Height: 5\' 4"  (1.626 m)   Body mass index is 25.1 kg/m. Physical Exam Vitals and nursing note reviewed.  Constitutional:      Appearance: Normal appearance.  HENT:     Head: Normocephalic and atraumatic.     Mouth/Throat:     Mouth: Mucous membranes are moist.  Eyes:     Extraocular Movements: Extraocular movements intact.     Conjunctiva/sclera: Conjunctivae normal.     Pupils: Pupils are equal, round, and reactive to light.  Cardiovascular:     Rate and Rhythm: Normal rate and regular rhythm.     Heart sounds: No murmur  heard.    Comments: Weak dorsalis pedis pulses R+L Pulmonary:     Effort: Pulmonary effort is normal.     Breath sounds: No rales.  Abdominal:     General: Bowel sounds are normal.     Palpations: Abdomen is soft.     Tenderness: There is no abdominal tenderness.  Musculoskeletal:     Cervical back: Normal range of motion and neck supple.     Right lower leg: Edema present.     Left lower leg: Edema present.     Comments: Trace edema BLE  Skin:    General: Skin is warm and dry.  Neurological:     General: No focal deficit present.     Mental Status: She is alert. Mental status is at baseline.     Motor: No weakness.     Coordination: Coordination normal.     Gait: Gait abnormal.  Psychiatric:        Mood and Affect: Mood normal.        Behavior: Behavior normal.    Labs reviewed: Recent Labs    11/24/20 1322 11/24/20 1715 11/25/20 0955 11/26/20 0602 02/20/21 0000  NA 141  --  141 140 143  K 3.0*  --  3.2* 3.8 3.8  CL 103  --  106 104 108  CO2 25  --  24 21*  --   GLUCOSE 107*  --  81 98  --   BUN 12  --  9 5* 27*  CREATININE 1.37*  --  0.94 0.85 1.2*  CALCIUM 9.1  --  9.0 9.2 26.0*  MG  --  1.8  --   --   --    Recent Labs    10/22/20 0000 11/24/20 1322 02/20/21 0000  AST 18 22 12*  ALT 11 11 8   ALKPHOS 105 87 79  BILITOT  --  0.7  --   PROT  --  6.3*  --   ALBUMIN 4.1 3.4* 3.5   Recent Labs    10/22/20 0000 11/24/20 1322 02/20/21 0000  WBC 8.7 12.7* 7.2  NEUTROABS 7,230.00 10.4* 4,918.00  HGB 15.2 13.8 11.8*  HCT 46 42.3 35*  MCV  --  101.7*  --   PLT 274 249 208   Lab Results  Component Value Date   TSH 2.34 06/22/2020   No results found for: HGBA1C No results found for: CHOL, HDL, LDLCALC, LDLDIRECT, TRIG, CHOLHDL  Significant Diagnostic Results in last 30 days:  No results found.  Assessment/Plan HTN (hypertension) blood pressure is controlled on Benazepril, Amlodipine.   CKD (chronic kidney disease) stage 3, GFR 30-59 ml/min  (HCC) Bun/creat 27/1.2 02/20/21  Gout stable, on Allopurinol 100mg  qd.             Hypothyroidism  stable, on Levothyroxine. TSH 2.34 06/22/20  Vascular dementia ([place]) MMSE dropped from 01/02/19 29/30 to11/3/21 MMSE 18/30, failed clock drawing. 07/15/19 CT head Mild generalized parenchymal atrophy and chronic small vessel ischemic disease, tolerated Memantine well.            History of CVA (cerebrovascular accident) Hx of CVA per CT head 08/02/20 Remote infarcts within the a occipital cortices bilaterally again noted. Interval development of remote infarcts within the left parietal lobe and periventricular white matte. On ASA  Generalized osteoarthritis of multiple sites  T5, T8, L3 compression fxs. Takes, Tylenol for pain, off Tramadol and Lyrica. DDD per CT head/cervical spine 08/02/20   Seizure-like activity (HCC) Seizure like activity, likely post fall, off Tramadol, not taking anti seizure meds.   TIA (transient ischemic attack) Hx of TIA. Allergic to statin. On ASA.  HPOA desired not hospital evaluation. Hgb 11.8 02/20/21  Edema, peripheral Minimal, continue Furosemide   Gait instability uses walker to ambulate.         Family/ staff Communication: plan of care reviewed with the patient and charge nurse.   Labs/tests ordered:  none  Time spend 35 minutes.

## 2021-03-15 NOTE — Assessment & Plan Note (Signed)
uses walker to ambulate.       

## 2021-03-15 NOTE — Assessment & Plan Note (Signed)
Minimal, continue Furosemide

## 2021-03-15 NOTE — Assessment & Plan Note (Signed)
Seizure like activity, likely post fall, off Tramadol, not taking anti seizure meds.  

## 2021-03-15 NOTE — Assessment & Plan Note (Signed)
stable, on Allopurinol 100mg  qd.

## 2021-03-15 NOTE — Assessment & Plan Note (Signed)
stable, on Levothyroxine. TSH 2.34 06/22/20

## 2021-03-15 NOTE — Assessment & Plan Note (Signed)
Hx of CVA per CT head 08/02/20 Remote infarcts within the a occipital cortices bilaterally again noted. Interval development of remote infarcts within the left parietal lobe and periventricular white matte. On ASA 

## 2021-03-15 NOTE — Assessment & Plan Note (Signed)
Bun/creat 27/1.2 02/20/21

## 2021-03-15 NOTE — Assessment & Plan Note (Signed)
T5, T8, L3 compression fxs. Takes, Tylenol for pain, off Tramadol and Lyrica. DDD per CT head/cervical spine 08/02/20  

## 2021-03-15 NOTE — Assessment & Plan Note (Signed)
MMSE dropped from 01/02/19 29/30 to11/3/21 MMSE 18/30, failed clock drawing. 07/15/19 CT head Mild generalized parenchymal atrophy and chronic small vessel ischemic disease, tolerated Memantine well.

## 2021-03-18 ENCOUNTER — Non-Acute Institutional Stay (SKILLED_NURSING_FACILITY): Payer: Medicare Other | Admitting: Nurse Practitioner

## 2021-03-18 ENCOUNTER — Encounter: Payer: Self-pay | Admitting: Nurse Practitioner

## 2021-03-18 DIAGNOSIS — U071 COVID-19: Secondary | ICD-10-CM | POA: Insufficient documentation

## 2021-03-18 DIAGNOSIS — N1831 Chronic kidney disease, stage 3a: Secondary | ICD-10-CM | POA: Diagnosis not present

## 2021-03-18 DIAGNOSIS — R2681 Unsteadiness on feet: Secondary | ICD-10-CM

## 2021-03-18 DIAGNOSIS — M159 Polyosteoarthritis, unspecified: Secondary | ICD-10-CM | POA: Diagnosis not present

## 2021-03-18 DIAGNOSIS — R609 Edema, unspecified: Secondary | ICD-10-CM | POA: Diagnosis not present

## 2021-03-18 DIAGNOSIS — R569 Unspecified convulsions: Secondary | ICD-10-CM | POA: Diagnosis not present

## 2021-03-18 DIAGNOSIS — Z8673 Personal history of transient ischemic attack (TIA), and cerebral infarction without residual deficits: Secondary | ICD-10-CM

## 2021-03-18 DIAGNOSIS — E039 Hypothyroidism, unspecified: Secondary | ICD-10-CM

## 2021-03-18 DIAGNOSIS — F01B0 Vascular dementia, moderate, without behavioral disturbance, psychotic disturbance, mood disturbance, and anxiety: Secondary | ICD-10-CM | POA: Diagnosis not present

## 2021-03-18 DIAGNOSIS — W19XXXA Unspecified fall, initial encounter: Secondary | ICD-10-CM | POA: Diagnosis not present

## 2021-03-18 DIAGNOSIS — M109 Gout, unspecified: Secondary | ICD-10-CM

## 2021-03-18 DIAGNOSIS — I10 Essential (primary) hypertension: Secondary | ICD-10-CM

## 2021-03-18 DIAGNOSIS — G459 Transient cerebral ischemic attack, unspecified: Secondary | ICD-10-CM | POA: Diagnosis not present

## 2021-03-18 NOTE — Assessment & Plan Note (Signed)
likely post fall, off Tramadol, not taking anti seizure meds.  

## 2021-03-18 NOTE — Assessment & Plan Note (Signed)
stable, on Levothyroxine. TSH 2.34 06/22/20

## 2021-03-18 NOTE — Progress Notes (Signed)
Location:   SNF [place] Room Number: 30 Place of Service:  SNF (31) Provider: [facility] Kapil Petropoulos NP  Virgie Dad, MD  Patient Care Team: Virgie Dad, MD as PCP - General (Internal Medicine) Aarya Quebedeaux X, NP as Nurse Practitioner (Internal Medicine) Virgie Dad, MD as Consulting Physician (Internal Medicine) Virgie Dad, MD (Internal Medicine)  Extended Emergency Contact Information Primary Emergency Contact: Jeannine Kitten of [place] Phone: [phone] Relation: Son Secondary Emergency Contact: Trautman,[name] Mobile Phone: [phone] Relation: Son  Code Status: DNR Goals of care: Advanced Directive information Advanced Directives 03/15/2021  Does Patient Have a Medical Advance Directive? Yes  Type of Paramedic of [place];Out of facility DNR (pink MOST or yellow form)  Does patient want to make changes to medical advance directive? No - Patient declined  Copy of [place] in Chart? Yes - validated most recent copy scanned in chart (See row information)  Would patient like information on creating a medical advance directive? -  Pre-existing out of facility DNR order (yellow form or pink MOST form) Pink MOST form placed in chart (order not valid for inpatient use)     Chief Complaint  Patient presents with   Acute Visit    Generalized weakness, fall, cough.     HPI:  Pt is a 88 y.o. female seen today for an acute visit for generalized weakness, fall 03/17/20 when the patient was found sitting in front of toilet, no apparent injury, cough noted, afebrile, denied sore throat or chest pain, no O2 desaturation.    Edema BLE Furosemide 3x/wk              Gait abnormality, uses walker to ambulate.              Hx of TIA. Allergic to statin. On ASA.  HPOA desired not hospital evaluation. Hgb 11.8 02/20/21 Seizure like activity, likely post fall, off Tramadol, not taking anti seizure meds.               T5,  T8, L3 compression fxs. Takes, Tylenol for pain, off Tramadol and Lyrica.              DDD per CT head/cervical spine 08/02/20              Hx of CVA per CT head 08/02/20 Remote infarcts within the a occipital cortices bilaterally again noted. Interval development of remote infarcts within the left parietal lobe and periventricular white matte. On ASA             Vascular dementia, MMSE dropped from 01/02/19 29/30 to11/3/21 MMSE 18/30, failed clock drawing. 07/15/19 CT head Mild generalized parenchymal atrophy and chronic small vessel ischemic disease, tolerated Memantine well.                          HTN, blood pressure is controlled on Benazepril, Amlodipine.              CKD Bun/creat 27/1.2 02/20/21             Gout stable, on Allopurinol 100mg  qd.                        Hypothyroidism, stable, on Levothyroxine. TSH 2.34 06/22/20             Abnormal CXR, suggested CT chest, not candidate for workup     Past Medical  History:  Diagnosis Date   Cognitive changes    Dementia ([place])    Depression    Gout    Hypertension    Hypothyroidism    Peripheral neuropathy    History reviewed. No pertinent surgical history.  Allergies  Allergen Reactions   Actonel [Risedronate Sodium]    Actonel [Risedronate] Other (See Comments)    "Allergic," per MAR   Hct [Hydrochlorothiazide]    Hydrochlorothiazide Other (See Comments)    "Allergic," per MAR   Lipitor [Atorvastatin Calcium]    Lipitor [Atorvastatin] Other (See Comments)    "Allergic," per [facility]   Neomycin Other (See Comments)    Unknown "been so long ago, a Dermatologist told me"   Neomycin Other (See Comments)    "Allergic," per [facility]   Polysporin [Bacitracin-Polymyxin B]    Polysporin [Bacitracin-Polymyxin B] Other (See Comments)    "Allergic," per MAR   Prolia [Denosumab]    Prolia [Denosumab] Other (See Comments)   Sulfa Antibiotics    Sulfa Antibiotics Other (See Comments)    "Allergic," per MAR   Zocor [Simvastatin]    Zocor  [Simvastatin] Other (See Comments)    "Allergic," per MAR    Allergies as of 03/18/2021       Reactions   Actonel [risedronate Sodium]    Actonel [risedronate] Other (See Comments)   "Allergic," per MAR   Hct [hydrochlorothiazide]    Hydrochlorothiazide Other (See Comments)   "Allergic," per MAR   Lipitor [atorvastatin Calcium]    Lipitor [atorvastatin] Other (See Comments)   "Allergic," per MAR   Neomycin Other (See Comments)   Unknown "been so long ago, a Dermatologist told me"   Neomycin Other (See Comments)   "Allergic," per MAR   Polysporin [bacitracin-polymyxin B]    Polysporin [bacitracin-polymyxin B] Other (See Comments)   "Allergic," per MAR   Prolia [denosumab]    Prolia [denosumab] Other (See Comments)   Sulfa Antibiotics    Sulfa Antibiotics Other (See Comments)   "Allergic," per MAR   Zocor [simvastatin]    Zocor [simvastatin] Other (See Comments)   "Allergic," per [facility]        Medication List        Accurate as of March 18, 2021 11:59 PM. If you have any questions, ask your nurse or doctor.          acetaminophen 325 MG tablet Commonly known as: TYLENOL Take 650 mg by mouth in the morning, at noon, and at bedtime.   allopurinol 100 MG tablet Commonly known as: ZYLOPRIM Take 100 mg by mouth daily.   amLODipine 5 MG tablet Commonly known as: NORVASC Take 1 tablet (5 mg total) by mouth daily.   aspirin 81 MG chewable tablet Chew 81 mg by mouth daily.   benazepril 10 MG tablet Commonly known as: LOTENSIN Take 10 mg by mouth in the morning.   Calcium Carb-Cholecalciferol 500-400 MG-UNIT Tabs Take 2 tablets by mouth daily.   Co Q-10 100 MG Caps Take 200 mg by mouth in the morning.   furosemide 20 MG tablet Commonly known as: LASIX Take 20 mg by mouth. Once A Day on Mon, Wed, Fri   gabapentin 100 MG capsule Commonly known as: NEURONTIN Take 100 mg by mouth at bedtime.   levothyroxine 100 MCG tablet Commonly known as: SYNTHROID Take  100 mcg by mouth once a week. On Wednesday   levothyroxine 50 MCG tablet Commonly known as: SYNTHROID Take 50 mcg by mouth daily before breakfast. Sun,Mon,Tue, Thur,Fri,Sat  meclizine 12.5 MG tablet Commonly known as: ANTIVERT Take 12.5 mg by mouth 2 (two) times daily as needed for dizziness.   memantine 10 MG tablet Commonly known as: NAMENDA Take 10 mg by mouth 2 (two) times daily.   Omega III EPA+DHA 1000 MG Caps Take 2,000 mg by mouth 2 (two) times daily.   Vitamin D 50 MCG (2000 UT) Caps Take 2,000 Units by mouth daily.        Review of Systems  Unable to perform ROS: Dementia   Immunization History  Administered Date(s) Administered   Influenza, High Dose Seasonal PF 12/11/2018   Influenza-Unspecified 03/24/2018, 12/21/2019, 12/26/2020   Moderna Sars-Covid-2 Vaccination 03/12/2019, 04/09/2019, 04/21/2019, 01/17/2020, 08/07/2020   Pneumococcal Conjugate-13 09/09/2012, 10/28/2013   Pneumococcal Polysaccharide-23 04/02/2009, 04/16/2009   Tdap 07/15/2019   Zoster, Live 04/16/2009   Pertinent  Health Maintenance Due  Topic Date Due   DEXA SCAN  Never done   INFLUENZA VACCINE  Completed   Fall Risk 11/25/2020 11/26/2020 11/26/2020 11/27/2020 11/27/2020  Patient Fall Risk Level High fall risk High fall risk High fall risk High fall risk High fall risk   Functional Status Survey:    Vitals:   03/18/21 1502  BP: 124/68  Pulse: 88  Resp: 20  Temp: 98.8 F (37.1 C)  SpO2: 95%   There is no height or weight on file to calculate BMI. Physical Exam Vitals and nursing note reviewed.  Constitutional:      Comments: fatigue  HENT:     Head: Normocephalic and atraumatic.     Nose: Nose normal.     Mouth/Throat:     Mouth: Mucous membranes are moist.  Eyes:     Extraocular Movements: Extraocular movements intact.     Conjunctiva/sclera: Conjunctivae normal.     Pupils: Pupils are equal, round, and reactive to light.  Cardiovascular:     Rate and Rhythm: Normal  rate and regular rhythm.     Heart sounds: No murmur heard.    Comments: Weak dorsalis pedis pulses R+L Pulmonary:     Effort: Pulmonary effort is normal.     Breath sounds: No rales.  Abdominal:     General: Bowel sounds are normal.     Palpations: Abdomen is soft.     Tenderness: There is no abdominal tenderness.  Musculoskeletal:     Cervical back: Normal range of motion and neck supple.     Right lower leg: Edema present.     Left lower leg: Edema present.     Comments: Trace edema BLE  Skin:    General: Skin is warm and dry.  Neurological:     General: No focal deficit present.     Mental Status: She is alert. Mental status is at baseline.     Motor: No weakness.     Coordination: Coordination normal.     Gait: Gait abnormal.  Psychiatric:        Mood and Affect: Mood normal.        Behavior: Behavior normal.    Labs reviewed: Recent Labs    11/24/20 1322 11/24/20 1715 11/25/20 0955 11/26/20 0602 02/20/21 0000  NA 141  --  141 140 143  K 3.0*  --  3.2* 3.8 3.8  CL 103  --  106 104 108  CO2 25  --  24 21*  --   GLUCOSE 107*  --  81 98  --   BUN 12  --  9 5* 27*  CREATININE 1.37*  --  0.94 0.85 1.2*  CALCIUM 9.1  --  9.0 9.2 26.0*  MG  --  1.8  --   --   --    Recent Labs    10/22/20 0000 11/24/20 1322 02/20/21 0000  AST 18 22 12*  ALT 11 11 8   ALKPHOS 105 87 79  BILITOT  --  0.7  --   PROT  --  6.3*  --   ALBUMIN 4.1 3.4* 3.5   Recent Labs    10/22/20 0000 11/24/20 1322 02/20/21 0000  WBC 8.7 12.7* 7.2  NEUTROABS 7,230.00 10.4* 4,918.00  HGB 15.2 13.8 11.8*  HCT 46 42.3 35*  MCV  --  101.7*  --   PLT 274 249 208   Lab Results  Component Value Date   TSH 2.34 06/22/2020   No results found for: HGBA1C No results found for: CHOL, HDL, LDLCALC, LDLDIRECT, TRIG, CHOLHDL  Significant Diagnostic Results in last 30 days:  No results found.  Assessment/Plan: COVID-19 virus infection generalized weakness, fall 03/17/20 when the patient was  found sitting in front of toilet, no apparent injury, cough noted, afebrile, denied sore throat or chest pain, no O2 desaturation. The patient had exposure. Will treat with Lagevrio 800mg  bid x 5 days, Mucinex 600mg  bid x 3 days.  Observe.   Edema, peripheral Trace edema BLE, continue Furosemide 3x/wk  Gait instability uses walker to ambulate.   Fall Increased frailty, unsteady gait, lack of safety awareness are contributory, will need close supervision/assistance for safety.   TIA (transient ischemic attack) Allergic to statin. On ASA.  HPOA desired not hospital evaluation. Hgb 11.8 02/20/21  Seizure-like activity (HCC)  likely post fall, off Tramadol, not taking anti seizure meds.   Generalized osteoarthritis of multiple sites T5, T8, L3 compression fxs. Takes, Tylenol for pain, off Tramadol and Lyrica. DDD per CT head/cervical spine 08/02/20   History of CVA (cerebrovascular accident) Hx of CVA per CT head 08/02/20 Remote infarcts, continue ASA  Vascular dementia ([place]) MMSE dropped from 01/02/19 29/30 to11/3/21 MMSE 18/30, failed clock drawing. 07/15/19 CT head Mild generalized parenchymal atrophy and chronic small vessel ischemic disease, tolerated Memantine well.    HTN (hypertension) blood pressure is controlled on Benazepril, Amlodipine.   CKD (chronic kidney disease) stage 3, GFR 30-59 ml/min (HCC) Bun/creat 27/1.2 02/20/21  Gout No flare ups, continue Allopurinol.   Hypothyroidism  stable, on Levothyroxine. TSH 2.34 06/22/20    Family/ staff Communication: plan of care reviewed with the patient and charge nurse.   Labs/tests ordered: none  Time spend 35 minutes.

## 2021-03-18 NOTE — Assessment & Plan Note (Signed)
No flare ups, continue Allopurinol.

## 2021-03-18 NOTE — Assessment & Plan Note (Signed)
generalized weakness, fall 03/17/20 when the patient was found sitting in front of toilet, no apparent injury, cough noted, afebrile, denied sore throat or chest pain, no O2 desaturation. The patient had exposure. Will treat with Lagevrio 800mg  bid x 5 days, Mucinex 600mg  bid x 3 days.  Observe.

## 2021-03-18 NOTE — Assessment & Plan Note (Signed)
T5, T8, L3 compression fxs. Takes, Tylenol for pain, off Tramadol and Lyrica. DDD per CT head/cervical spine 08/02/20  

## 2021-03-18 NOTE — Assessment & Plan Note (Signed)
Increased frailty, unsteady gait, lack of safety awareness are contributory, will need close supervision/assistance for safety.

## 2021-03-18 NOTE — Assessment & Plan Note (Signed)
Bun/creat 27/1.2 02/20/21

## 2021-03-18 NOTE — Assessment & Plan Note (Signed)
Hx of CVA per CT head 08/02/20 Remote infarcts, continue ASA

## 2021-03-18 NOTE — Assessment & Plan Note (Signed)
uses walker to ambulate.       

## 2021-03-18 NOTE — Assessment & Plan Note (Signed)
MMSE dropped from 01/02/19 29/30 to11/3/21 MMSE 18/30, failed clock drawing. 07/15/19 CT head Mild generalized parenchymal atrophy and chronic small vessel ischemic disease, tolerated Memantine well.

## 2021-03-18 NOTE — Assessment & Plan Note (Signed)
Trace edema BLE, continue Furosemide 3x/wk

## 2021-03-18 NOTE — Assessment & Plan Note (Signed)
blood pressure is controlled on Benazepril, Amlodipine.  

## 2021-03-18 NOTE — Assessment & Plan Note (Signed)
Allergic to statin. On ASA.  HPOA desired not hospital evaluation. Hgb 11.8 02/20/21

## 2021-03-19 ENCOUNTER — Encounter: Payer: Self-pay | Admitting: Nurse Practitioner

## 2021-04-12 ENCOUNTER — Encounter: Payer: Self-pay | Admitting: Nurse Practitioner

## 2021-04-12 ENCOUNTER — Non-Acute Institutional Stay (SKILLED_NURSING_FACILITY): Payer: Medicare Other | Admitting: Nurse Practitioner

## 2021-04-12 DIAGNOSIS — M159 Polyosteoarthritis, unspecified: Secondary | ICD-10-CM | POA: Diagnosis not present

## 2021-04-12 DIAGNOSIS — M109 Gout, unspecified: Secondary | ICD-10-CM

## 2021-04-12 DIAGNOSIS — I10 Essential (primary) hypertension: Secondary | ICD-10-CM

## 2021-04-12 DIAGNOSIS — Z8673 Personal history of transient ischemic attack (TIA), and cerebral infarction without residual deficits: Secondary | ICD-10-CM | POA: Diagnosis not present

## 2021-04-12 DIAGNOSIS — F01B0 Vascular dementia, moderate, without behavioral disturbance, psychotic disturbance, mood disturbance, and anxiety: Secondary | ICD-10-CM

## 2021-04-12 DIAGNOSIS — N1831 Chronic kidney disease, stage 3a: Secondary | ICD-10-CM | POA: Diagnosis not present

## 2021-04-12 DIAGNOSIS — E039 Hypothyroidism, unspecified: Secondary | ICD-10-CM | POA: Diagnosis not present

## 2021-04-12 DIAGNOSIS — R569 Unspecified convulsions: Secondary | ICD-10-CM | POA: Diagnosis not present

## 2021-04-12 NOTE — Assessment & Plan Note (Signed)
Hx of CVA per CT head 08/02/20 Remote infarcts within the a occipital cortices bilaterally again noted. Interval development of remote infarcts within the left parietal lobe and periventricular white matte. On ASA 

## 2021-04-12 NOTE — Assessment & Plan Note (Signed)
blood pressure is controlled on Benazepril, Amlodipine.  

## 2021-04-12 NOTE — Assessment & Plan Note (Signed)
Bun/creat 27/1.2 02/20/21

## 2021-04-12 NOTE — Assessment & Plan Note (Signed)
T5, T8, L3 compression fxs. Takes, Tylenol for pain, off Tramadol and Lyrica. DDD per CT head/cervical spine 08/02/20  

## 2021-04-12 NOTE — Progress Notes (Signed)
Location:   [place] Room Number: 31 Place of Service:  SNF (31) Provider:  Marda Stalker, Lennie Odor NP  Virgie Dad, MD  Patient Care Team: Virgie Dad, MD as PCP - General (Internal Medicine) Alanny Rivers X, NP as Nurse Practitioner (Internal Medicine) Virgie Dad, MD as Consulting Physician (Internal Medicine) Virgie Dad, MD (Internal Medicine)  Extended Emergency Contact Information Primary Emergency Contact: Jeannine Kitten of [place] Phone: [phone] Relation: Son Secondary Emergency Contact: Smestad,[name] Mobile Phone: [phone] Relation: Son  Code Status:  DNR Goals of care: Advanced Directive information Advanced Directives 04/12/2021  Does Patient Have a Medical Advance Directive? Yes  Type of Paramedic of [place];Out of facility DNR (pink MOST or yellow form)  Does patient want to make changes to medical advance directive? No - Patient declined  Copy of [place] in Chart? Yes - validated most recent copy scanned in chart (See row information)  Would patient like information on creating a medical advance directive? -  Pre-existing out of facility DNR order (yellow form or pink MOST form) Pink MOST form placed in chart (order not valid for inpatient use)     Chief Complaint  Patient presents with   Medical Management of Chronic Issues   Quality Metric Gaps    Zoster Vaccines- Shingrix (1 of 2)   DEXA SCAN      HPI:  Pt is a 88 y.o. female seen today for medical management of chronic diseases.     Edema BLE Furosemide 3x/wk              Gait abnormality, uses walker to ambulate.              Hx of TIA. Allergic to statin. On ASA.  HPOA desired not hospital evaluation. Hgb 11.8 02/20/21 Seizure like activity, likely post fall, off Tramadol, not taking anti seizure meds.               T5, T8, L3 compression fxs. Takes, Tylenol for pain, off Tramadol and Lyrica.               DDD per CT head/cervical spine 08/02/20              Hx of CVA per CT head 08/02/20 Remote infarcts within the a occipital cortices bilaterally again noted. Interval development of remote infarcts within the left parietal lobe and periventricular white matte. On ASA             Vascular dementia, MMSE dropped from 01/02/19 29/30 to11/3/21 MMSE 18/30, failed clock drawing. 07/15/19 CT head Mild generalized parenchymal atrophy and chronic small vessel ischemic disease, tolerated Memantine well.                          HTN, blood pressure is controlled on Benazepril, Amlodipine.              CKD Bun/creat 27/1.2 02/20/21             Gout stable, on Allopurinol 100mg  qd.                        Hypothyroidism, stable, on Levothyroxine. TSH 2.34 06/22/20             Abnormal CXR, suggested CT chest, not candidate for workup     Past Medical History:  Diagnosis Date   Cognitive changes  Dementia ([place])    Depression    Gout    Hypertension    Hypothyroidism    Peripheral neuropathy    History reviewed. No pertinent surgical history.  Allergies  Allergen Reactions   Actonel [Risedronate Sodium]    Actonel [Risedronate] Other (See Comments)    "Allergic," per [facility]   Hct [Hydrochlorothiazide]    Hydrochlorothiazide Other (See Comments)    "Allergic," per [facility]   Lipitor [Atorvastatin Calcium]    Lipitor [Atorvastatin] Other (See Comments)    "Allergic," per [facility]   Neomycin Other (See Comments)    Unknown "been so long ago, a Dermatologist told me"   Neomycin Other (See Comments)    "Allergic," per [facility]   Polysporin [Bacitracin-Polymyxin B]    Polysporin [Bacitracin-Polymyxin B] Other (See Comments)    "Allergic," per [facility]   Prolia [Denosumab]    Prolia [Denosumab] Other (See Comments)   Sulfa Antibiotics    Sulfa Antibiotics Other (See Comments)    "Allergic," per [facility]   Zocor [Simvastatin]    Zocor [Simvastatin] Other (See Comments)    "Allergic," per [facility]    Allergies as of 04/12/2021        Reactions   Actonel [risedronate Sodium]    Actonel [risedronate] Other (See Comments)   "Allergic," per [facility]   Hct [hydrochlorothiazide]    Hydrochlorothiazide Other (See Comments)   "Allergic," per [facility]   Lipitor [atorvastatin Calcium]    Lipitor [atorvastatin] Other (See Comments)   "Allergic," per [facility]   Neomycin Other (See Comments)   Unknown "been so long ago, a Dermatologist told me"   Neomycin Other (See Comments)   "Allergic," per [facility]   Polysporin [bacitracin-polymyxin B]    Polysporin [bacitracin-polymyxin B] Other (See Comments)   "Allergic," per [facility]   Prolia [denosumab]    Prolia [denosumab] Other (See Comments)   Sulfa Antibiotics    Sulfa Antibiotics Other (See Comments)   "Allergic," per [facility]   Zocor [simvastatin]    Zocor [simvastatin] Other (See Comments)   "Allergic," per [facility]        Medication List        Accurate as of April 12, 2021 11:59 PM. If you have any questions, ask your nurse or doctor.          acetaminophen 325 MG tablet Commonly known as: TYLENOL Take 650 mg by mouth in the morning, at noon, and at bedtime.   allopurinol 100 MG tablet Commonly known as: ZYLOPRIM Take 100 mg by mouth daily.   amLODipine 5 MG tablet Commonly known as: NORVASC Take 1 tablet (5 mg total) by mouth daily.   aspirin 81 MG chewable tablet Chew 81 mg by mouth daily.   benazepril 10 MG tablet Commonly known as: LOTENSIN Take 10 mg by mouth in the morning.   Calcium Carb-Cholecalciferol 500-400 MG-UNIT Tabs Take 2 tablets by mouth daily.   Co Q-10 100 MG Caps Take 200 mg by mouth in the morning.   furosemide 20 MG tablet Commonly known as: LASIX Take 20 mg by mouth. Once A Day on Mon, Wed, Fri   gabapentin 100 MG capsule Commonly known as: NEURONTIN Take 100 mg by mouth at bedtime.   levothyroxine 100 MCG tablet Commonly known as: SYNTHROID Take 100 mcg by mouth once a week. On Wednesday   levothyroxine 50 MCG tablet Commonly known  as: SYNTHROID Take 50 mcg by mouth daily before breakfast. Sun,Mon,Tue, Thur,Fri,Sat   meclizine 12.5 MG tablet Commonly known as: ANTIVERT Take 12.5  mg by mouth 2 (two) times daily as needed for dizziness.   memantine 10 MG tablet Commonly known as: NAMENDA Take 10 mg by mouth 2 (two) times daily.   Omega III EPA+DHA 1000 MG Caps Take 2,000 mg by mouth 2 (two) times daily.   Vitamin D 50 MCG (2000 UT) Caps Take 2,000 Units by mouth daily.        Review of Systems  Unable to perform ROS: Dementia   Immunization History  Administered Date(s) Administered   Influenza, High Dose Seasonal PF 12/11/2018   Influenza-Unspecified 03/24/2018, 12/21/2019, 12/26/2020   Moderna Sars-Covid-2 Vaccination 03/12/2019, 04/09/2019, 04/21/2019, 01/17/2020, 08/07/2020   Pneumococcal Conjugate-13 09/09/2012, 10/28/2013   Pneumococcal Polysaccharide-23 04/02/2009, 04/16/2009   Tdap 07/15/2019   Zoster, Live 04/16/2009   Zoster, Unspecified 04/16/2009   Pertinent  Health Maintenance Due  Topic Date Due   DEXA SCAN  Never done   INFLUENZA VACCINE  Completed   Fall Risk 11/25/2020 11/26/2020 11/26/2020 11/27/2020 11/27/2020  Patient Fall Risk Level High fall risk High fall risk High fall risk High fall risk High fall risk   Functional Status Survey:    Vitals:   04/12/21 1056  BP: 118/60  Pulse: 76  Resp: 18  Temp: 97.9 F (36.6 C)  SpO2: 94%  Weight: 142 lb 1.6 oz (64.5 kg)  Height: 5\' 4"  (1.626 m)   Body mass index is 24.39 kg/m. Physical Exam Vitals and nursing note reviewed.  Constitutional:      Comments: fatigue  HENT:     Head: Normocephalic and atraumatic.     Nose: Nose normal.     Mouth/Throat:     Mouth: Mucous membranes are moist.  Eyes:     Extraocular Movements: Extraocular movements intact.     Conjunctiva/sclera: Conjunctivae normal.     Pupils: Pupils are equal, round, and reactive to light.  Cardiovascular:     Rate and Rhythm: Normal rate and regular  rhythm.     Heart sounds: No murmur heard.    Comments: Weak dorsalis pedis pulses R+L Pulmonary:     Effort: Pulmonary effort is normal.     Breath sounds: No rales.  Abdominal:     General: Bowel sounds are normal.     Palpations: Abdomen is soft.     Tenderness: There is no abdominal tenderness.  Musculoskeletal:     Cervical back: Normal range of motion and neck supple.     Right lower leg: Edema present.     Left lower leg: Edema present.     Comments: Trace edema BLE  Skin:    General: Skin is warm and dry.  Neurological:     General: No focal deficit present.     Mental Status: She is alert. Mental status is at baseline.     Motor: No weakness.     Coordination: Coordination normal.     Gait: Gait abnormal.  Psychiatric:        Mood and Affect: Mood normal.        Behavior: Behavior normal.    Labs reviewed: Recent Labs    11/24/20 1322 11/24/20 1715 11/25/20 0955 11/26/20 0602 02/20/21 0000  NA 141  --  141 140 143  K 3.0*  --  3.2* 3.8 3.8  CL 103  --  106 104 108  CO2 25  --  24 21*  --   GLUCOSE 107*  --  81 98  --   BUN 12  --  9 5* 27*  CREATININE 1.37*  --  0.94 0.85 1.2*  CALCIUM 9.1  --  9.0 9.2 26.0*  MG  --  1.8  --   --   --    Recent Labs    10/22/20 0000 11/24/20 1322 02/20/21 0000  AST 18 22 12*  ALT 11 11 8   ALKPHOS 105 87 79  BILITOT  --  0.7  --   PROT  --  6.3*  --   ALBUMIN 4.1 3.4* 3.5   Recent Labs    10/22/20 0000 11/24/20 1322 02/20/21 0000  WBC 8.7 12.7* 7.2  NEUTROABS 7,230.00 10.4* 4,918.00  HGB 15.2 13.8 11.8*  HCT 46 42.3 35*  MCV  --  101.7*  --   PLT 274 249 208   Lab Results  Component Value Date   TSH 2.34 06/22/2020   No results found for: HGBA1C No results found for: CHOL, HDL, LDLCALC, LDLDIRECT, TRIG, CHOLHDL  Significant Diagnostic Results in last 30 days:  No results found.  Assessment/Plan Seizure-like activity (HCC) Stable, Seizure like activity, likely post fall, off Tramadol, not taking  anti seizure meds.   Generalized osteoarthritis of multiple sites T5, T8, L3 compression fxs. Takes, Tylenol for pain, off Tramadol and Lyrica. DDD per CT head/cervical spine 08/02/20   History of CVA (cerebrovascular accident) Hx of CVA per CT head 08/02/20 Remote infarcts within the a occipital cortices bilaterally again noted. Interval development of remote infarcts within the left parietal lobe and periventricular white matte. On ASA  Vascular dementia ([place]) Vascular dementia, MMSE dropped from 01/02/19 29/30 to11/3/21 MMSE 18/30, failed clock drawing. 07/15/19 CT head Mild generalized parenchymal atrophy and chronic small vessel ischemic disease, tolerated Memantine well.       HTN (hypertension) blood pressure is controlled on Benazepril, Amlodipine.   CKD (chronic kidney disease) stage 3, GFR 30-59 ml/min (HCC) Bun/creat 27/1.2 02/20/21  Gout stable, on Allopurinol 100mg  qd.       Hypothyroidism stable, on Levothyroxine. TSH 2.34 06/22/20     Family/ staff Communication: plan of care reviewed with the patient and charge nurse.   Labs/tests ordered:  none  Time spend 35 minutes.

## 2021-04-12 NOTE — Assessment & Plan Note (Signed)
Stable, Seizure like activity, likely post fall, off Tramadol, not taking anti seizure meds.

## 2021-04-12 NOTE — Assessment & Plan Note (Signed)
stable, on Levothyroxine. TSH 2.34 06/22/20

## 2021-04-12 NOTE — Assessment & Plan Note (Signed)
stable, on Allopurinol 100mg  qd.

## 2021-04-12 NOTE — Assessment & Plan Note (Signed)
Vascular dementia, MMSE dropped from 01/02/19 29/30 to11/3/21 MMSE 18/30, failed clock drawing. 07/15/19 CT head Mild generalized parenchymal atrophy and chronic small vessel ischemic disease, tolerated Memantine well.

## 2021-04-15 ENCOUNTER — Encounter: Payer: Self-pay | Admitting: Nurse Practitioner

## 2021-05-14 ENCOUNTER — Encounter: Payer: Self-pay | Admitting: Internal Medicine

## 2021-05-14 ENCOUNTER — Non-Acute Institutional Stay (SKILLED_NURSING_FACILITY): Payer: Medicare Other | Admitting: Internal Medicine

## 2021-05-14 DIAGNOSIS — R9389 Abnormal findings on diagnostic imaging of other specified body structures: Secondary | ICD-10-CM

## 2021-05-14 DIAGNOSIS — I10 Essential (primary) hypertension: Secondary | ICD-10-CM

## 2021-05-14 DIAGNOSIS — R2681 Unsteadiness on feet: Secondary | ICD-10-CM | POA: Diagnosis not present

## 2021-05-14 DIAGNOSIS — F01B0 Vascular dementia, moderate, without behavioral disturbance, psychotic disturbance, mood disturbance, and anxiety: Secondary | ICD-10-CM

## 2021-05-14 DIAGNOSIS — N1831 Chronic kidney disease, stage 3a: Secondary | ICD-10-CM

## 2021-05-14 DIAGNOSIS — R569 Unspecified convulsions: Secondary | ICD-10-CM | POA: Diagnosis not present

## 2021-05-14 DIAGNOSIS — M109 Gout, unspecified: Secondary | ICD-10-CM

## 2021-05-14 DIAGNOSIS — Z8673 Personal history of transient ischemic attack (TIA), and cerebral infarction without residual deficits: Secondary | ICD-10-CM

## 2021-05-14 DIAGNOSIS — R600 Localized edema: Secondary | ICD-10-CM

## 2021-05-14 NOTE — Progress Notes (Signed)
Location:   [place] Room Number: [place] of Service:  SNF [id number]) Provider:  Veleta Miners MD  Virgie Dad, MD  Patient Care Team: Virgie Dad, MD as PCP - General (Internal Medicine) Mast, Man X, NP as Nurse Practitioner (Internal Medicine) Virgie Dad, MD as Consulting Physician (Internal Medicine) Virgie Dad, MD (Internal Medicine)  Extended Emergency Contact Information Primary Emergency Contact: Jeannine Kitten of [place] Phone: [phone] Relation: Son Secondary Emergency Contact: Brannigan,[name] Mobile Phone: [phone] Relation: Son  Code Status:  DNR Goals of care: Advanced Directive information Advanced Directives 05/14/2021  Does Patient Have a Medical Advance Directive? Yes  Type of Paramedic of [place];Out of facility DNR (pink MOST or yellow form)  Does patient want to make changes to medical advance directive? No - Patient declined  Copy of [place] in Chart? Yes - validated most recent copy scanned in chart (See row information)  Would patient like information on creating a medical advance directive? -  Pre-existing out of facility DNR order (yellow form or pink MOST form) Pink MOST form placed in chart (order not valid for inpatient use)     Chief Complaint  Patient presents with   Medical Management of Chronic Issues   Quality Metric Gaps    Shingrix Dexa  NCIR/Matrix verified    HPI:  Pt is a 88 y.o. female seen today for medical management of chronic diseases.    Patient has a history of hypertension, gout, hypothyroidism and peripheral neuropathy and Osteoporosis  Cognitive impairment due to Vascular Dementia H/o Recurent Falls with CT scan of had showed new remote infarcts in Left Parietal area and Periventricular area H/o TIA Lumbar Compression Fracture at L3 and at T 5 and T 8 Cervical spine Narrowing Admitted to the hospital 9/17-9/21 for  Seizure Like activity Also Nodular Density in Chest Xray Family did not want CT scan   She is stable. No new Nursing issues. No Behavior issues Her weight is stable Walks with her walker No Falls Wt Readings from Last 3 Encounters:  05/14/21 145 lb 6.4 oz (66 kg)  04/12/21 142 lb 1.6 oz (64.5 kg)  03/15/21 146 lb 3.2 oz (66.3 kg)      Past Medical History:  Diagnosis Date   Cognitive changes    Dementia (HCC)    Depression    Gout    Hypertension    Hypothyroidism    Peripheral neuropathy    History reviewed. No pertinent surgical history.  Allergies  Allergen Reactions   Actonel [Risedronate Sodium]    Actonel [Risedronate] Other (See Comments)    "Allergic," per [facility]   Hct [Hydrochlorothiazide]    Hydrochlorothiazide Other (See Comments)    "Allergic," per [facility]   Lipitor [Atorvastatin Calcium]    Lipitor [Atorvastatin] Other (See Comments)    "Allergic," per [facility]   Neomycin Other (See Comments)    Unknown "been so long ago, a Dermatologist told me"   Neomycin Other (See Comments)    "Allergic," per [facility]   Polysporin [Bacitracin-Polymyxin B]    Polysporin [Bacitracin-Polymyxin B] Other (See Comments)    "Allergic," per [facility]   Prolia [Denosumab]    Prolia [Denosumab] Other (See Comments)   Sulfa Antibiotics    Sulfa Antibiotics Other (See Comments)    "Allergic," per [facility]   Zocor [Simvastatin]    Zocor [Simvastatin] Other (See Comments)    "Allergic," per [facility]  Allergies as of 05/14/2021       Reactions   Actonel [risedronate Sodium]    Actonel [risedronate] Other (See Comments)   "Allergic," per [facility]   Hct [hydrochlorothiazide]    Hydrochlorothiazide Other (See Comments)   "Allergic," per [facility]   Lipitor [atorvastatin Calcium]    Lipitor [atorvastatin] Other (See Comments)   "Allergic," per [facility]   Neomycin Other (See Comments)   Unknown "been so long ago, a Dermatologist told me"   Neomycin Other (See Comments)   "Allergic," per [facility]   Polysporin  [bacitracin-polymyxin B]    Polysporin [bacitracin-polymyxin B] Other (See Comments)   "Allergic," per [facility]   Prolia [denosumab]    Prolia [denosumab] Other (See Comments)   Sulfa Antibiotics    Sulfa Antibiotics Other (See Comments)   "Allergic," per [facility]   Zocor [simvastatin]    Zocor [simvastatin] Other (See Comments)   "Allergic," per [facility]        Medication List        Accurate as of May 14, 2021 11:44 AM. If you have any questions, ask your nurse or doctor.          acetaminophen 325 MG tablet Commonly known as: TYLENOL Take 650 mg by mouth in the morning, at noon, and at bedtime.   allopurinol 100 MG tablet Commonly known as: ZYLOPRIM Take 100 mg by mouth daily.   amLODipine 5 MG tablet Commonly known as: NORVASC Take 1 tablet (5 mg total) by mouth daily.   aspirin 81 MG chewable tablet Chew 81 mg by mouth daily.   benazepril 10 MG tablet Commonly known as: LOTENSIN Take 10 mg by mouth in the morning.   Calcium Carb-Cholecalciferol 500-400 MG-UNIT Tabs Take 2 tablets by mouth daily.   Co Q-10 100 MG Caps Take 200 mg by mouth in the morning.   furosemide 20 MG tablet Commonly known as: LASIX Take 20 mg by mouth. Once A Day on Mon, Wed, Fri   gabapentin 100 MG capsule Commonly known as: NEURONTIN Take 100 mg by mouth at bedtime.   levothyroxine 100 MCG tablet Commonly known as: SYNTHROID Take 100 mcg by mouth once a week. On Wednesday   levothyroxine 50 MCG tablet Commonly known as: SYNTHROID Take 50 mcg by mouth daily before breakfast. Sun,Mon,Tue, Thur,Fri,Sat   meclizine 12.5 MG tablet Commonly known as: ANTIVERT Take 12.5 mg by mouth 2 (two) times daily as needed for dizziness.   memantine 10 MG tablet Commonly known as: NAMENDA Take 10 mg by mouth 2 (two) times daily.   Omega III EPA+DHA 1000 MG Caps Take 2,000 mg by mouth 2 (two) times daily.   Vitamin D 50 MCG (2000 UT) Caps Take 2,000 Units by mouth daily.        Review of  Systems  Constitutional:  Negative for activity change and appetite change.  HENT: Negative.    Respiratory:  Negative for cough and shortness of breath.   Cardiovascular:  Negative for leg swelling.  Gastrointestinal:  Negative for constipation.  Genitourinary: Negative.   Musculoskeletal:  Negative for arthralgias, gait problem and myalgias.  Skin: Negative.   Neurological:  Negative for dizziness and weakness.  Psychiatric/Behavioral:  Positive for confusion. Negative for dysphoric mood and sleep disturbance.    Immunization History  Administered Date(s) Administered   Influenza, High Dose Seasonal PF 12/11/2018   Influenza-Unspecified 03/24/2018, 12/21/2019, 12/26/2020   Moderna Sars-Covid-2 Vaccination 03/12/2019, 04/09/2019, 04/21/2019, 01/17/2020, 08/07/2020   Pneumococcal Conjugate-13 09/09/2012, 10/28/2013   Pneumococcal Polysaccharide-23  04/02/2009, 04/16/2009   Tdap 07/15/2019   Zoster, Live 04/16/2009   Zoster, Unspecified 04/16/2009   Pertinent  Health Maintenance Due  Topic Date Due   DEXA SCAN  Never done   INFLUENZA VACCINE  Completed   Fall Risk 11/25/2020 11/26/2020 11/26/2020 11/27/2020 11/27/2020  Patient Fall Risk Level High fall risk High fall risk High fall risk High fall risk High fall risk   Functional Status Survey:    Vitals:   05/14/21 1138  BP: (!) 141/77  Pulse: 76  Resp: 16  Temp: 98.1 F (36.7 C)  SpO2: 99%  Weight: 145 lb 6.4 oz (66 kg)  Height: '5\' 4"'$  (1.626 m)   Body mass index is 24.96 kg/m. Physical Exam Vitals reviewed.  Constitutional:      Appearance: Normal appearance.  HENT:     Head: Normocephalic.     Nose: Nose normal.     Mouth/Throat:     Mouth: Mucous membranes are moist.     Pharynx: Oropharynx is clear.  Eyes:     Pupils: Pupils are equal, round, and reactive to light.  Cardiovascular:     Rate and Rhythm: Normal rate and regular rhythm.     Pulses: Normal pulses.     Heart sounds: Normal heart sounds. No murmur  heard. Pulmonary:     Effort: Pulmonary effort is normal.     Breath sounds: Normal breath sounds.  Abdominal:     General: Abdomen is flat. Bowel sounds are normal.     Palpations: Abdomen is soft.  Musculoskeletal:        General: No swelling.     Cervical back: Neck supple.  Skin:    General: Skin is warm.  Neurological:     General: No focal deficit present.     Mental Status: She is alert.  Psychiatric:        Mood and Affect: Mood normal.        Thought Content: Thought content normal.    Labs reviewed: Recent Labs    11/24/20 1322 11/24/20 1715 11/25/20 0955 11/26/20 0602 02/20/21 0000  NA 141  --  141 140 143  K 3.0*  --  3.2* 3.8 3.8  CL 103  --  106 104 108  CO2 25  --  24 21*  --   GLUCOSE 107*  --  81 98  --   BUN 12  --  9 5* 27*  CREATININE 1.37*  --  0.94 0.85 1.2*  CALCIUM 9.1  --  9.0 9.2 26.0*  MG  --  1.8  --   --   --    Recent Labs    10/22/20 0000 11/24/20 1322 02/20/21 0000  AST 18 22 12*  ALT '11 11 8  '$ ALKPHOS 105 87 79  BILITOT  --  0.7  --   PROT  --  6.3*  --   ALBUMIN 4.1 3.4* 3.5   Recent Labs    10/22/20 0000 11/24/20 1322 02/20/21 0000  WBC 8.7 12.7* 7.2  NEUTROABS 7,230.00 10.4* 4,918.00  HGB 15.2 13.8 11.8*  HCT 46 42.3 35*  MCV  --  101.7*  --   PLT 274 249 208   Lab Results  Component Value Date   TSH 2.34 06/22/2020   No results found for: HGBA1C No results found for: CHOL, HDL, LDLCALC, LDLDIRECT, TRIG, CHOLHDL  Significant Diagnostic Results in last 30 days:  No results found.  Assessment/Plan 1. Bilateral leg edema Doing well on Lasix  2. Stage 3a chronic kidney disease (HCC) Creat stable  3. Gait instability Doing well in SNF with her walker  4. Seizure-like activity [facility]) Per Neurology Most likely post fall No Anti seizure meds right now Tramadol was discontinued  5. Moderate vascular dementia without behavioral disturbance, psychotic disturbance, mood disturbance, or anxiety On  Namenda Supportive care  6. History of CVA (cerebrovascular accident) On Aspirin Allergic to statin  7. Gout, unspecified cause, unspecified chronicity, unspecified site Allopurinol  8. Primary hypertension On Norvasc and Benazepril  9. Abnormal chest x-[place] Family does not want follow up   Family/ staff Communication:   Labs/tests ordered:

## 2021-06-10 ENCOUNTER — Encounter: Payer: Self-pay | Admitting: Nurse Practitioner

## 2021-06-10 ENCOUNTER — Non-Acute Institutional Stay (SKILLED_NURSING_FACILITY): Payer: Medicare Other | Admitting: Nurse Practitioner

## 2021-06-10 DIAGNOSIS — R2681 Unsteadiness on feet: Secondary | ICD-10-CM

## 2021-06-10 DIAGNOSIS — R635 Abnormal weight gain: Secondary | ICD-10-CM

## 2021-06-10 DIAGNOSIS — I10 Essential (primary) hypertension: Secondary | ICD-10-CM

## 2021-06-10 DIAGNOSIS — F01B0 Vascular dementia, moderate, without behavioral disturbance, psychotic disturbance, mood disturbance, and anxiety: Secondary | ICD-10-CM

## 2021-06-10 DIAGNOSIS — R569 Unspecified convulsions: Secondary | ICD-10-CM

## 2021-06-10 DIAGNOSIS — G459 Transient cerebral ischemic attack, unspecified: Secondary | ICD-10-CM | POA: Diagnosis not present

## 2021-06-10 DIAGNOSIS — M109 Gout, unspecified: Secondary | ICD-10-CM | POA: Diagnosis not present

## 2021-06-10 DIAGNOSIS — R9389 Abnormal findings on diagnostic imaging of other specified body structures: Secondary | ICD-10-CM

## 2021-06-10 DIAGNOSIS — E039 Hypothyroidism, unspecified: Secondary | ICD-10-CM | POA: Diagnosis not present

## 2021-06-10 DIAGNOSIS — M159 Polyosteoarthritis, unspecified: Secondary | ICD-10-CM | POA: Diagnosis not present

## 2021-06-10 DIAGNOSIS — N1831 Chronic kidney disease, stage 3a: Secondary | ICD-10-CM

## 2021-06-10 DIAGNOSIS — R609 Edema, unspecified: Secondary | ICD-10-CM | POA: Diagnosis not present

## 2021-06-10 DIAGNOSIS — Z8673 Personal history of transient ischemic attack (TIA), and cerebral infarction without residual deficits: Secondary | ICD-10-CM

## 2021-06-10 DIAGNOSIS — R600 Localized edema: Secondary | ICD-10-CM

## 2021-06-10 NOTE — Progress Notes (Signed)
?Location:   [facility]  ?Nursing Home Room Number: 42V ?Place of Service:  SNF (31) ?Provider:  Adira Limburg X, NP ? ?Virgie Dad, MD ? ?Patient Care Team: ?Virgie Dad, MD as PCP - General (Internal Medicine) ?Yaw Escoto X, NP as Nurse Practitioner (Internal Medicine) ?Virgie Dad, MD as Consulting Physician (Internal Medicine) ?Virgie Dad, MD (Internal Medicine) ? ?Extended Emergency Contact Information ?Primary Emergency Contact: Waltrip,[name] ? [place] ?Home Phone: [phone] ?Relation: Son ?Secondary Emergency Contact: Ninneman,[name] ?Mobile Phone: [phone] ?Relation: Son ? ?Code Status:  DNR ?Goals of care: Advanced Directive information ? ?  06/10/2021  ? 10:22 AM  ?Advanced Directives  ?Does Patient Have a Medical Advance Directive? Yes  ?Type of Paramedic of [place];Out of facility DNR (pink MOST or yellow form)  ?Does patient want to make changes to medical advance directive? No - Patient declined  ?Copy of [place] in Chart? Yes - validated most recent copy scanned in chart (See row information)  ?Pre-existing out of facility DNR order (yellow form or pink MOST form) Pink MOST form placed in chart (order not valid for inpatient use)  ? ? ? ?Chief Complaint  ?Patient presents with  ? Medical Management of Chronic Issues  ?  Routine follow up  ? Immunizations  ?  Shingrix vaccine  ? Health Maintenance  ?  Dexa scan  ? ? ?HPI:  ?Pt is a 88 y.o. female seen today for medical management of chronic diseases.   ?Edema BLE Furosemide 3x/wk ?Gait abnormality, uses walker to ambulate.               ?Hx of TIA. Allergic to statin. On ASA.  HPOA desired not hospital evaluation. Hgb 11.8 02/20/21 ?Seizure like activity, likely post fall, off Tramadol, not taking anti seizure meds.  ?             T5, T8, L3 compression fxs. Takes, Tylenol for pain, off Tramadol and Lyrica.  ?            DDD per CT head/cervical spine 08/02/20  ?             Hx of CVA per CT head 08/02/20 Remote infarcts within the a occipital cortices bilaterally again noted. Interval development of remote infarcts within the left parietal lobe and periventricular white matte. On ASA ?            Vascular dementia, 07/15/19 CT head Mild generalized parenchymal atrophy and chronic small vessel ?ischemic disease, tolerated Memantine well.              ?            HTN, blood pressure is controlled on Benazepril, Amlodipine.  ?            CKD Bun/creat 27/1.2 02/20/21 ?            Gout stable, on Allopurinol '100mg'$  qd.            ?            Hypothyroidism, stable, on Levothyroxine. TSH 2.34 06/22/20 ?            Abnormal CXR, suggested CT chest, not candidate for workup ? ? ?Past Medical History:  ?Diagnosis Date  ? Cognitive changes   ? Dementia ([place])   ? Depression   ? Gout   ? Hypertension   ? Hypothyroidism   ? Peripheral neuropathy   ? ?  History reviewed. No pertinent surgical history. ? ?Allergies  ?Allergen Reactions  ? Actonel [Risedronate Sodium]   ? Actonel [Risedronate] Other (See Comments)  ?  "Allergic," per MAR  ? Hct [Hydrochlorothiazide]   ? Hydrochlorothiazide Other (See Comments)  ?  "Allergic," per MAR  ? Lipitor [Atorvastatin Calcium]   ? Lipitor [Atorvastatin] Other (See Comments)  ?  "Allergic," per MAR  ? Neomycin Other (See Comments)  ?  Unknown "been so long ago, a Dermatologist told me"  ? Neomycin Other (See Comments)  ?  "Allergic," per MAR  ? Polysporin [Bacitracin-Polymyxin B]   ? Polysporin [Bacitracin-Polymyxin B] Other (See Comments)  ?  "Allergic," per MAR  ? Prolia [Denosumab]   ? Prolia [Denosumab] Other (See Comments)  ? Sulfa Antibiotics   ? Sulfa Antibiotics Other (See Comments)  ?  "Allergic," per MAR  ? Zocor [Simvastatin]   ? Zocor [Simvastatin] Other (See Comments)  ?  "Allergic," per MAR  ? ? ?Allergies as of 06/10/2021   ? ?   Reactions  ? Actonel [risedronate Sodium]   ? Actonel [risedronate] Other (See Comments)  ? "Allergic," per MAR  ? Hct  [hydrochlorothiazide]   ? Hydrochlorothiazide Other (See Comments)  ? "Allergic," per MAR  ? Lipitor [atorvastatin Calcium]   ? Lipitor [atorvastatin] Other (See Comments)  ? "Allergic," per MAR  ? Neomycin Other (See Comments)  ? Unknown "been so long ago, a Dermatologist told me"  ? Neomycin Other (See Comments)  ? "Allergic," per MAR  ? Polysporin [bacitracin-polymyxin B]   ? Polysporin [bacitracin-polymyxin B] Other (See Comments)  ? "Allergic," per MAR  ? Prolia [denosumab]   ? Prolia [denosumab] Other (See Comments)  ? Sulfa Antibiotics   ? Sulfa Antibiotics Other (See Comments)  ? "Allergic," per MAR  ? Zocor [simvastatin]   ? Zocor [simvastatin] Other (See Comments)  ? "Allergic," per MAR  ? ?  ? ?  ?Medication List  ?  ? ?  ? Accurate as of June 10, 2021 11:59 PM. If you have any questions, ask your nurse or doctor.  ?  ?  ? ?  ? ?STOP taking these medications   ? ?furosemide 20 MG tablet ?Commonly known as: LASIX ?Stopped by: Deja Pisarski X Zakee Deerman, NP ?  ? ?  ? ?TAKE these medications   ? ?acetaminophen 325 MG tablet ?Commonly known as: TYLENOL ?Take 650 mg by mouth in the morning, at noon, and at bedtime. ?  ?allopurinol 100 MG tablet ?Commonly known as: ZYLOPRIM ?Take 100 mg by mouth daily. ?  ?amLODipine 5 MG tablet ?Commonly known as: NORVASC ?Take 1 tablet (5 mg total) by mouth daily. ?  ?aspirin 81 MG chewable tablet ?Chew 81 mg by mouth daily. ?  ?benazepril 10 MG tablet ?Commonly known as: LOTENSIN ?Take 10 mg by mouth in the morning. ?  ?Calcium Carb-Cholecalciferol 500-400 MG-UNIT Tabs ?Take 2 tablets by mouth daily. ?  ?Co Q-10 100 MG Caps ?Take 200 mg by mouth in the morning. ?  ?gabapentin 100 MG capsule ?Commonly known as: NEURONTIN ?Take 100 mg by mouth at bedtime. ?  ?levothyroxine 100 MCG tablet ?Commonly known as: SYNTHROID ?Take 100 mcg by mouth once a week. On Wednesday ?  ?levothyroxine 50 MCG tablet ?Commonly known as: SYNTHROID ?Take 50 mcg by mouth daily before breakfast. Sun,Mon,Tue,  Thur,Fri,Sat ?  ?meclizine 12.5 MG tablet ?Commonly known as: ANTIVERT ?Take 12.5 mg by mouth 2 (two) times daily as needed for dizziness. ?  ?memantine 10  MG tablet ?Commonly known as: NAMENDA ?Take 10 mg by mouth 2 (two) times daily. ?  ?Omega III EPA+DHA 1000 MG Caps ?Take 2,000 mg by mouth 2 (two) times daily. ?  ?Vitamin D 50 MCG (2000 UT) Caps ?Take 2,000 Units by mouth daily. ?  ? ?  ? ? ?Review of Systems  ?Unable to perform ROS: Dementia  ? ?Immunization History  ?Administered Date(s) Administered  ? Influenza, High Dose Seasonal PF 12/11/2018  ? Influenza-Unspecified 03/24/2018, 12/21/2019, 12/26/2020  ? Moderna Sars-Covid-2 Vaccination 03/12/2019, 04/09/2019, 04/21/2019, 01/17/2020, 08/07/2020  ? Pneumococcal Conjugate-13 09/09/2012, 10/28/2013  ? Pneumococcal Polysaccharide-23 04/02/2009, 04/16/2009  ? Tdap 07/15/2019  ? Zoster, Live 04/16/2009  ? Zoster, Unspecified 04/16/2009  ? ?Pertinent  Health Maintenance Due  ?Topic Date Due  ? DEXA SCAN  Never done  ? INFLUENZA VACCINE  10/08/2021  ? ? ?  11/25/2020  ?  8:00 PM 11/26/2020  ?  9:00 AM 11/26/2020  ?  8:30 PM 11/27/2020  ? 10:00 AM 11/27/2020  ?  8:00 PM  ?Fall Risk  ?Patient Fall Risk Level High fall risk High fall risk High fall risk High fall risk High fall risk  ? ?Functional Status Survey: ?  ? ?Vitals:  ? 06/10/21 1020  ?Temp: (!) 97.4 ?F (36.3 ?C)  ?SpO2: 96%  ?Weight: 152 lb 6.4 oz (69.1 kg)  ?Height: '5\' 4"'$  (1.626 m)  ? ?Body mass index is 26.16 kg/m?Marland Kitchen ?Physical Exam ?Vitals and nursing note reviewed.  ?Constitutional:   ?   Comments: Fatigue. Weight gained about #7Ibs in the past month.   ?HENT:  ?   Head: Normocephalic and atraumatic.  ?   Nose: Nose normal.  ?   Mouth/Throat:  ?   Mouth: Mucous membranes are moist.  ?Eyes:  ?   Extraocular Movements: Extraocular movements intact.  ?   Conjunctiva/sclera: Conjunctivae normal.  ?   Pupils: Pupils are equal, round, and reactive to light.  ?Cardiovascular:  ?   Rate and Rhythm: Normal rate and  regular rhythm.  ?   Heart sounds: No murmur heard. ?   Comments: Weak dorsalis pedis pulses R+L ?Pulmonary:  ?   Effort: Pulmonary effort is normal.  ?   Breath sounds: No rales.  ?Abdominal:  ?   General: Bowel s

## 2021-06-11 DIAGNOSIS — R9389 Abnormal findings on diagnostic imaging of other specified body structures: Secondary | ICD-10-CM | POA: Insufficient documentation

## 2021-06-11 NOTE — Assessment & Plan Note (Signed)
No behavioral issues,  07/15/19 CT head Mild generalized parenchymal atrophy and chronic small vessel ischemic disease, tolerated Memantine well.     ?

## 2021-06-11 NOTE — Assessment & Plan Note (Signed)
blood pressure is controlled on Benazepril, Amlodipine.  

## 2021-06-11 NOTE — Assessment & Plan Note (Signed)
uses walker to ambulate.       

## 2021-06-11 NOTE — Assessment & Plan Note (Signed)
Abnormal CXR, suggested CT chest, not candidate for workup 

## 2021-06-11 NOTE — Assessment & Plan Note (Signed)
likely post fall, off Tramadol, not taking anti seizure meds.  

## 2021-06-11 NOTE — Assessment & Plan Note (Signed)
Allergic to statin. On ASA.  HPOA desired not hospital evaluation. Hgb 11.8 02/20/21 ?

## 2021-06-11 NOTE — Assessment & Plan Note (Signed)
Hx of CVA per CT head 08/02/20 Remote infarcts within the a occipital cortices bilaterally again noted. Interval development of remote infarcts within the left parietal lobe and periventricular white matte. On ASA 

## 2021-06-11 NOTE — Assessment & Plan Note (Signed)
About #7Ibs in the past month, no significant change of edema BLE, will continue to monitor. Update CBC/diff, CMP/eGFR, TSH.  ?

## 2021-06-11 NOTE — Assessment & Plan Note (Signed)
Bun/creat 27/1.2 02/20/21 ?

## 2021-06-11 NOTE — Assessment & Plan Note (Signed)
Stable, on Allopurinol '100mg'$  qd. ?

## 2021-06-11 NOTE — Assessment & Plan Note (Signed)
Trace edema BLE, continue Furosemide 3x/wk ?

## 2021-06-11 NOTE — Assessment & Plan Note (Signed)
stable, on Levothyroxine. TSH 2.34 06/22/20 ?

## 2021-06-11 NOTE — Assessment & Plan Note (Signed)
T5, T8, L3 compression fxs. DDD per CT head/cervical spine 08/02/20  Takes Tylenol for pain, off Tramadol and Lyrica.  ?

## 2021-06-13 ENCOUNTER — Encounter: Payer: Self-pay | Admitting: Nurse Practitioner

## 2021-06-18 DIAGNOSIS — I10 Essential (primary) hypertension: Secondary | ICD-10-CM | POA: Diagnosis not present

## 2021-06-18 LAB — CBC AND DIFFERENTIAL
HCT: 37 (ref 36–46)
Hemoglobin: 12.5 (ref 12.0–16.0)
Neutrophils Absolute: 5925
Platelets: 226 10*3/uL (ref 150–400)
WBC: 7.9

## 2021-06-18 LAB — HEPATIC FUNCTION PANEL
ALT: 12 U/L (ref 7–35)
AST: 15 (ref 13–35)
Alkaline Phosphatase: 88 (ref 25–125)
Bilirubin, Total: 0.3

## 2021-06-18 LAB — BASIC METABOLIC PANEL
BUN: 26 — AB (ref 4–21)
CO2: 25 — AB (ref 13–22)
Chloride: 105 (ref 99–108)
Creatinine: 1.2 — AB (ref 0.5–1.1)
Glucose: 109
Potassium: 3.7 mEq/L (ref 3.5–5.1)
Sodium: 142 (ref 137–147)

## 2021-06-18 LAB — COMPREHENSIVE METABOLIC PANEL
Albumin: 3.8 (ref 3.5–5.0)
Calcium: 9.4 (ref 8.7–10.7)
Globulin: 3
eGFR: 42

## 2021-06-18 LAB — CBC: RBC: 3.82 — AB (ref 3.87–5.11)

## 2021-06-24 DIAGNOSIS — M6281 Muscle weakness (generalized): Secondary | ICD-10-CM | POA: Diagnosis not present

## 2021-06-24 DIAGNOSIS — R2689 Other abnormalities of gait and mobility: Secondary | ICD-10-CM | POA: Diagnosis not present

## 2021-06-24 DIAGNOSIS — U099 Post covid-19 condition, unspecified: Secondary | ICD-10-CM | POA: Diagnosis not present

## 2021-06-24 DIAGNOSIS — R29898 Other symptoms and signs involving the musculoskeletal system: Secondary | ICD-10-CM | POA: Diagnosis not present

## 2021-06-24 DIAGNOSIS — N3946 Mixed incontinence: Secondary | ICD-10-CM | POA: Diagnosis not present

## 2021-06-26 DIAGNOSIS — R2689 Other abnormalities of gait and mobility: Secondary | ICD-10-CM | POA: Diagnosis not present

## 2021-06-26 DIAGNOSIS — M6281 Muscle weakness (generalized): Secondary | ICD-10-CM | POA: Diagnosis not present

## 2021-06-26 DIAGNOSIS — N3946 Mixed incontinence: Secondary | ICD-10-CM | POA: Diagnosis not present

## 2021-06-26 DIAGNOSIS — R29898 Other symptoms and signs involving the musculoskeletal system: Secondary | ICD-10-CM | POA: Diagnosis not present

## 2021-06-26 DIAGNOSIS — U099 Post covid-19 condition, unspecified: Secondary | ICD-10-CM | POA: Diagnosis not present

## 2021-06-28 DIAGNOSIS — R29898 Other symptoms and signs involving the musculoskeletal system: Secondary | ICD-10-CM | POA: Diagnosis not present

## 2021-06-28 DIAGNOSIS — U099 Post covid-19 condition, unspecified: Secondary | ICD-10-CM | POA: Diagnosis not present

## 2021-06-28 DIAGNOSIS — R2689 Other abnormalities of gait and mobility: Secondary | ICD-10-CM | POA: Diagnosis not present

## 2021-06-28 DIAGNOSIS — N3946 Mixed incontinence: Secondary | ICD-10-CM | POA: Diagnosis not present

## 2021-06-28 DIAGNOSIS — M6281 Muscle weakness (generalized): Secondary | ICD-10-CM | POA: Diagnosis not present

## 2021-07-01 DIAGNOSIS — M6281 Muscle weakness (generalized): Secondary | ICD-10-CM | POA: Diagnosis not present

## 2021-07-01 DIAGNOSIS — U099 Post covid-19 condition, unspecified: Secondary | ICD-10-CM | POA: Diagnosis not present

## 2021-07-01 DIAGNOSIS — R2689 Other abnormalities of gait and mobility: Secondary | ICD-10-CM | POA: Diagnosis not present

## 2021-07-01 DIAGNOSIS — R29898 Other symptoms and signs involving the musculoskeletal system: Secondary | ICD-10-CM | POA: Diagnosis not present

## 2021-07-01 DIAGNOSIS — N3946 Mixed incontinence: Secondary | ICD-10-CM | POA: Diagnosis not present

## 2021-07-02 DIAGNOSIS — Q6689 Other  specified congenital deformities of feet: Secondary | ICD-10-CM | POA: Diagnosis not present

## 2021-07-02 DIAGNOSIS — L602 Onychogryphosis: Secondary | ICD-10-CM | POA: Diagnosis not present

## 2021-07-02 DIAGNOSIS — N3946 Mixed incontinence: Secondary | ICD-10-CM | POA: Diagnosis not present

## 2021-07-02 DIAGNOSIS — M6281 Muscle weakness (generalized): Secondary | ICD-10-CM | POA: Diagnosis not present

## 2021-07-02 DIAGNOSIS — R2689 Other abnormalities of gait and mobility: Secondary | ICD-10-CM | POA: Diagnosis not present

## 2021-07-02 DIAGNOSIS — R29898 Other symptoms and signs involving the musculoskeletal system: Secondary | ICD-10-CM | POA: Diagnosis not present

## 2021-07-02 DIAGNOSIS — U099 Post covid-19 condition, unspecified: Secondary | ICD-10-CM | POA: Diagnosis not present

## 2021-07-04 DIAGNOSIS — M6281 Muscle weakness (generalized): Secondary | ICD-10-CM | POA: Diagnosis not present

## 2021-07-04 DIAGNOSIS — U099 Post covid-19 condition, unspecified: Secondary | ICD-10-CM | POA: Diagnosis not present

## 2021-07-04 DIAGNOSIS — R29898 Other symptoms and signs involving the musculoskeletal system: Secondary | ICD-10-CM | POA: Diagnosis not present

## 2021-07-04 DIAGNOSIS — R2689 Other abnormalities of gait and mobility: Secondary | ICD-10-CM | POA: Diagnosis not present

## 2021-07-04 DIAGNOSIS — N3946 Mixed incontinence: Secondary | ICD-10-CM | POA: Diagnosis not present

## 2021-07-05 DIAGNOSIS — M6281 Muscle weakness (generalized): Secondary | ICD-10-CM | POA: Diagnosis not present

## 2021-07-05 DIAGNOSIS — U099 Post covid-19 condition, unspecified: Secondary | ICD-10-CM | POA: Diagnosis not present

## 2021-07-05 DIAGNOSIS — R29898 Other symptoms and signs involving the musculoskeletal system: Secondary | ICD-10-CM | POA: Diagnosis not present

## 2021-07-05 DIAGNOSIS — R2689 Other abnormalities of gait and mobility: Secondary | ICD-10-CM | POA: Diagnosis not present

## 2021-07-05 DIAGNOSIS — N3946 Mixed incontinence: Secondary | ICD-10-CM | POA: Diagnosis not present

## 2021-07-08 DIAGNOSIS — R29898 Other symptoms and signs involving the musculoskeletal system: Secondary | ICD-10-CM | POA: Diagnosis not present

## 2021-07-08 DIAGNOSIS — U099 Post covid-19 condition, unspecified: Secondary | ICD-10-CM | POA: Diagnosis not present

## 2021-07-08 DIAGNOSIS — N3946 Mixed incontinence: Secondary | ICD-10-CM | POA: Diagnosis not present

## 2021-07-08 DIAGNOSIS — M6281 Muscle weakness (generalized): Secondary | ICD-10-CM | POA: Diagnosis not present

## 2021-07-08 DIAGNOSIS — R2689 Other abnormalities of gait and mobility: Secondary | ICD-10-CM | POA: Diagnosis not present

## 2021-07-09 DIAGNOSIS — N3946 Mixed incontinence: Secondary | ICD-10-CM | POA: Diagnosis not present

## 2021-07-09 DIAGNOSIS — R29898 Other symptoms and signs involving the musculoskeletal system: Secondary | ICD-10-CM | POA: Diagnosis not present

## 2021-07-09 DIAGNOSIS — U099 Post covid-19 condition, unspecified: Secondary | ICD-10-CM | POA: Diagnosis not present

## 2021-07-09 DIAGNOSIS — M6281 Muscle weakness (generalized): Secondary | ICD-10-CM | POA: Diagnosis not present

## 2021-07-09 DIAGNOSIS — R2689 Other abnormalities of gait and mobility: Secondary | ICD-10-CM | POA: Diagnosis not present

## 2021-07-10 DIAGNOSIS — R29898 Other symptoms and signs involving the musculoskeletal system: Secondary | ICD-10-CM | POA: Diagnosis not present

## 2021-07-10 DIAGNOSIS — R2689 Other abnormalities of gait and mobility: Secondary | ICD-10-CM | POA: Diagnosis not present

## 2021-07-10 DIAGNOSIS — M6281 Muscle weakness (generalized): Secondary | ICD-10-CM | POA: Diagnosis not present

## 2021-07-10 DIAGNOSIS — N3946 Mixed incontinence: Secondary | ICD-10-CM | POA: Diagnosis not present

## 2021-07-10 DIAGNOSIS — U099 Post covid-19 condition, unspecified: Secondary | ICD-10-CM | POA: Diagnosis not present

## 2021-07-11 DIAGNOSIS — U099 Post covid-19 condition, unspecified: Secondary | ICD-10-CM | POA: Diagnosis not present

## 2021-07-11 DIAGNOSIS — R2689 Other abnormalities of gait and mobility: Secondary | ICD-10-CM | POA: Diagnosis not present

## 2021-07-11 DIAGNOSIS — N3946 Mixed incontinence: Secondary | ICD-10-CM | POA: Diagnosis not present

## 2021-07-11 DIAGNOSIS — R29898 Other symptoms and signs involving the musculoskeletal system: Secondary | ICD-10-CM | POA: Diagnosis not present

## 2021-07-11 DIAGNOSIS — M6281 Muscle weakness (generalized): Secondary | ICD-10-CM | POA: Diagnosis not present

## 2021-07-15 DIAGNOSIS — R2689 Other abnormalities of gait and mobility: Secondary | ICD-10-CM | POA: Diagnosis not present

## 2021-07-15 DIAGNOSIS — N3946 Mixed incontinence: Secondary | ICD-10-CM | POA: Diagnosis not present

## 2021-07-15 DIAGNOSIS — R29898 Other symptoms and signs involving the musculoskeletal system: Secondary | ICD-10-CM | POA: Diagnosis not present

## 2021-07-15 DIAGNOSIS — M6281 Muscle weakness (generalized): Secondary | ICD-10-CM | POA: Diagnosis not present

## 2021-07-15 DIAGNOSIS — U099 Post covid-19 condition, unspecified: Secondary | ICD-10-CM | POA: Diagnosis not present

## 2021-07-16 DIAGNOSIS — R29898 Other symptoms and signs involving the musculoskeletal system: Secondary | ICD-10-CM | POA: Diagnosis not present

## 2021-07-16 DIAGNOSIS — U099 Post covid-19 condition, unspecified: Secondary | ICD-10-CM | POA: Diagnosis not present

## 2021-07-16 DIAGNOSIS — R2689 Other abnormalities of gait and mobility: Secondary | ICD-10-CM | POA: Diagnosis not present

## 2021-07-16 DIAGNOSIS — N3946 Mixed incontinence: Secondary | ICD-10-CM | POA: Diagnosis not present

## 2021-07-16 DIAGNOSIS — M6281 Muscle weakness (generalized): Secondary | ICD-10-CM | POA: Diagnosis not present

## 2021-07-17 DIAGNOSIS — U099 Post covid-19 condition, unspecified: Secondary | ICD-10-CM | POA: Diagnosis not present

## 2021-07-17 DIAGNOSIS — M6281 Muscle weakness (generalized): Secondary | ICD-10-CM | POA: Diagnosis not present

## 2021-07-17 DIAGNOSIS — R29898 Other symptoms and signs involving the musculoskeletal system: Secondary | ICD-10-CM | POA: Diagnosis not present

## 2021-07-17 DIAGNOSIS — R2689 Other abnormalities of gait and mobility: Secondary | ICD-10-CM | POA: Diagnosis not present

## 2021-07-17 DIAGNOSIS — N3946 Mixed incontinence: Secondary | ICD-10-CM | POA: Diagnosis not present

## 2021-07-18 DIAGNOSIS — R2689 Other abnormalities of gait and mobility: Secondary | ICD-10-CM | POA: Diagnosis not present

## 2021-07-18 DIAGNOSIS — N3946 Mixed incontinence: Secondary | ICD-10-CM | POA: Diagnosis not present

## 2021-07-18 DIAGNOSIS — R29898 Other symptoms and signs involving the musculoskeletal system: Secondary | ICD-10-CM | POA: Diagnosis not present

## 2021-07-18 DIAGNOSIS — M6281 Muscle weakness (generalized): Secondary | ICD-10-CM | POA: Diagnosis not present

## 2021-07-18 DIAGNOSIS — U099 Post covid-19 condition, unspecified: Secondary | ICD-10-CM | POA: Diagnosis not present

## 2021-07-19 DIAGNOSIS — R29898 Other symptoms and signs involving the musculoskeletal system: Secondary | ICD-10-CM | POA: Diagnosis not present

## 2021-07-19 DIAGNOSIS — N3946 Mixed incontinence: Secondary | ICD-10-CM | POA: Diagnosis not present

## 2021-07-19 DIAGNOSIS — U099 Post covid-19 condition, unspecified: Secondary | ICD-10-CM | POA: Diagnosis not present

## 2021-07-19 DIAGNOSIS — M6281 Muscle weakness (generalized): Secondary | ICD-10-CM | POA: Diagnosis not present

## 2021-07-19 DIAGNOSIS — R2689 Other abnormalities of gait and mobility: Secondary | ICD-10-CM | POA: Diagnosis not present

## 2021-07-22 DIAGNOSIS — R29898 Other symptoms and signs involving the musculoskeletal system: Secondary | ICD-10-CM | POA: Diagnosis not present

## 2021-07-22 DIAGNOSIS — N3946 Mixed incontinence: Secondary | ICD-10-CM | POA: Diagnosis not present

## 2021-07-22 DIAGNOSIS — U099 Post covid-19 condition, unspecified: Secondary | ICD-10-CM | POA: Diagnosis not present

## 2021-07-22 DIAGNOSIS — R2689 Other abnormalities of gait and mobility: Secondary | ICD-10-CM | POA: Diagnosis not present

## 2021-07-22 DIAGNOSIS — M6281 Muscle weakness (generalized): Secondary | ICD-10-CM | POA: Diagnosis not present

## 2021-07-23 DIAGNOSIS — U099 Post covid-19 condition, unspecified: Secondary | ICD-10-CM | POA: Diagnosis not present

## 2021-07-23 DIAGNOSIS — M6281 Muscle weakness (generalized): Secondary | ICD-10-CM | POA: Diagnosis not present

## 2021-07-23 DIAGNOSIS — N3946 Mixed incontinence: Secondary | ICD-10-CM | POA: Diagnosis not present

## 2021-07-23 DIAGNOSIS — R2689 Other abnormalities of gait and mobility: Secondary | ICD-10-CM | POA: Diagnosis not present

## 2021-07-23 DIAGNOSIS — R29898 Other symptoms and signs involving the musculoskeletal system: Secondary | ICD-10-CM | POA: Diagnosis not present

## 2021-07-24 ENCOUNTER — Non-Acute Institutional Stay (SKILLED_NURSING_FACILITY): Payer: Medicare Other | Admitting: Orthopedic Surgery

## 2021-07-24 ENCOUNTER — Encounter: Payer: Self-pay | Admitting: Orthopedic Surgery

## 2021-07-24 DIAGNOSIS — N1831 Chronic kidney disease, stage 3a: Secondary | ICD-10-CM | POA: Diagnosis not present

## 2021-07-24 DIAGNOSIS — Z8673 Personal history of transient ischemic attack (TIA), and cerebral infarction without residual deficits: Secondary | ICD-10-CM

## 2021-07-24 DIAGNOSIS — R29898 Other symptoms and signs involving the musculoskeletal system: Secondary | ICD-10-CM | POA: Diagnosis not present

## 2021-07-24 DIAGNOSIS — R609 Edema, unspecified: Secondary | ICD-10-CM

## 2021-07-24 DIAGNOSIS — N3946 Mixed incontinence: Secondary | ICD-10-CM | POA: Diagnosis not present

## 2021-07-24 DIAGNOSIS — M109 Gout, unspecified: Secondary | ICD-10-CM | POA: Diagnosis not present

## 2021-07-24 DIAGNOSIS — M6281 Muscle weakness (generalized): Secondary | ICD-10-CM | POA: Diagnosis not present

## 2021-07-24 DIAGNOSIS — R2689 Other abnormalities of gait and mobility: Secondary | ICD-10-CM | POA: Diagnosis not present

## 2021-07-24 DIAGNOSIS — E039 Hypothyroidism, unspecified: Secondary | ICD-10-CM

## 2021-07-24 DIAGNOSIS — I10 Essential (primary) hypertension: Secondary | ICD-10-CM | POA: Diagnosis not present

## 2021-07-24 DIAGNOSIS — R569 Unspecified convulsions: Secondary | ICD-10-CM | POA: Diagnosis not present

## 2021-07-24 DIAGNOSIS — U099 Post covid-19 condition, unspecified: Secondary | ICD-10-CM | POA: Diagnosis not present

## 2021-07-24 DIAGNOSIS — M159 Polyosteoarthritis, unspecified: Secondary | ICD-10-CM

## 2021-07-24 DIAGNOSIS — R635 Abnormal weight gain: Secondary | ICD-10-CM | POA: Diagnosis not present

## 2021-07-24 DIAGNOSIS — M5030 Other cervical disc degeneration, unspecified cervical region: Secondary | ICD-10-CM

## 2021-07-24 DIAGNOSIS — F01B0 Vascular dementia, moderate, without behavioral disturbance, psychotic disturbance, mood disturbance, and anxiety: Secondary | ICD-10-CM | POA: Diagnosis not present

## 2021-07-24 NOTE — Progress Notes (Signed)
?Location:  [facility] ?Nursing Home Room Number: N031/A ?Place of Service:  SNF (31) ?Provider: Yvonna Alanis, NP ? ?Patient Care Team: ?Virgie Dad, MD as PCP - General (Internal Medicine) ?Mast, Man X, NP as Nurse Practitioner (Internal Medicine) ?Virgie Dad, MD as Consulting Physician (Internal Medicine) ?Virgie Dad, MD (Internal Medicine) ? ?Extended Emergency Contact Information ?Primary Emergency Contact: Oltmann,[name] ? [place] ?Home Phone: [phone] ?Relation: Son ?Secondary Emergency Contact: Drenning,[name] ?Mobile Phone: [phone] ?Relation: Son ? ?Code Status:  DNR ?Goals of care: Advanced Directive information ? ?  07/24/2021  ? 11:41 AM  ?Advanced Directives  ?Does Patient Have a Medical Advance Directive? Yes  ?Type of Paramedic of [place];Out of facility DNR (pink MOST or yellow form)  ?Does patient want to make changes to medical advance directive? No - Patient declined  ?Copy of [place] in Chart? Yes - validated most recent copy scanned in chart (See row information)  ?Pre-existing out of facility DNR order (yellow form or pink MOST form) Pink MOST form placed in chart (order not valid for inpatient use)  ? ? ? ?Chief Complaint  ?Patient presents with  ? Medical Management of Chronic Issues  ?  Routine visit.   ? Quality Metric Gaps  ?  Discuss the need for Shingrix vaccine and Dexa scan, or post pone if patient refuses.   ? ? ?HPI:  ?Pt is a 88 y.o. female seen today for medical management of chronic diseases.   ? ?She currently resides on the skilled nursing unit at [facility]. PMH: HTN, TIA, hypothyroidism, vascular dementia, neuropathy, OA, DDD, CKD, gout, and gait instability.  ? ?Vascular dementia- MRI brain 11/25/2020 revealed chronic small vessel disease with small cortical infarcts, MMSE 18/30 2021, no behavioral outbursts, ambulates with walker, remains on Namenda ?H/o CVA- 08/02/2020 CT head  noted infarcts to occipital cortices and remote infarcts to left parietal lobe and periventricular white matter, h/o TIA in past- HPOA refused hospital workup in past, remains on asa, allergy to statin, on fish oil ?HTN- BUN/creat 26/1.2 06/18/2021, remains on amlodipine and benazepril ?BLE- L>R, remains on furosemide 3x/week ?CKD- see above ?Hypothyroidism- TSH 3.95 06/18/2021, remains on levothyroxine ?Gout- no recent flares, uric acid 7.7 in past, remains on allopurinol ?OA/DDD- noted on CT spine 11/2020- C3-4 and C5-6, remains on tylenol and gabapentin, past use of Tramadol and Lyrica ?Seizure like activity- post fall 04/2021, not on seizure medications at this time  ?Weight gain- see trends below ? ?No recent falls or injuries.  ? ?05/04 given covid bivalent booster.  ? ?Recent blood pressures: ? 05/15- 115/72 ? 05/08- 118/60 ? 05/01- 133/74 ? ?Recent weights: ? 05/01- 156.9 lbs ? 04/07- 155.9 lbs ? 03/01- 145.4 lbs ?  ? ? ? ?Past Medical History:  ?Diagnosis Date  ? Cognitive changes   ? Dementia ([place])   ? Depression   ? Gout   ? Hypertension   ? Hypothyroidism   ? Peripheral neuropathy   ? ?History reviewed. No pertinent surgical history. ? ?Allergies  ?Allergen Reactions  ? Actonel [Risedronate Sodium]   ? Actonel [Risedronate] Other (See Comments)  ?  "Allergic," per MAR  ? Hct [Hydrochlorothiazide]   ? Hydrochlorothiazide Other (See Comments)  ?  "Allergic," per MAR  ? Lipitor [Atorvastatin Calcium]   ? Lipitor [Atorvastatin] Other (See Comments)  ?  "Allergic," per MAR  ? Neomycin Other (See Comments)  ?  Unknown "been so  long ago, a Dermatologist told me"  ? Neomycin Other (See Comments)  ?  "Allergic," per MAR  ? Polysporin [Bacitracin-Polymyxin B]   ? Polysporin [Bacitracin-Polymyxin B] Other (See Comments)  ?  "Allergic," per MAR  ? Prolia [Denosumab]   ? Prolia [Denosumab] Other (See Comments)  ? Sulfa Antibiotics   ? Sulfa Antibiotics Other (See Comments)  ?  "Allergic," per MAR  ? Zocor  [Simvastatin]   ? Zocor [Simvastatin] Other (See Comments)  ?  "Allergic," per MAR  ? ? ?Outpatient Encounter Medications as of 07/24/2021  ?Medication Sig  ? acetaminophen (TYLENOL) 325 MG tablet Take 650 mg by mouth in the morning, at noon, and at bedtime.  ? allopurinol (ZYLOPRIM) 100 MG tablet Take 100 mg by mouth daily.  ? amLODipine (NORVASC) 5 MG tablet Take 1 tablet (5 mg total) by mouth daily.  ? aspirin 81 MG chewable tablet Chew 81 mg by mouth daily.  ? benazepril (LOTENSIN) 10 MG tablet Take 10 mg by mouth in the morning.  ? Calcium Carb-Cholecalciferol 500-400 MG-UNIT TABS Take 2 tablets by mouth daily.   ? Cholecalciferol (VITAMIN D) 50 MCG (2000 UT) CAPS Take 2,000 Units by mouth daily.  ? Coenzyme Q10 (CO Q-10) 100 MG CAPS Take 200 mg by mouth in the morning.  ? gabapentin (NEURONTIN) 100 MG capsule Take 100 mg by mouth at bedtime.  ? levothyroxine (SYNTHROID) 100 MCG tablet Take 100 mcg by mouth once a week. On Wednesday  ? levothyroxine (SYNTHROID) 50 MCG tablet Take 50 mcg by mouth daily before breakfast. Sun,Mon,Tue, Thur,Fri,Sat  ? meclizine (ANTIVERT) 12.5 MG tablet Take 12.5 mg by mouth 2 (two) times daily as needed for dizziness.  ? memantine (NAMENDA) 10 MG tablet Take 10 mg by mouth 2 (two) times daily.  ? Omega-3 Fatty Acids (OMEGA III EPA+DHA) 1000 MG CAPS Take 2,000 mg by mouth 2 (two) times daily.  ? ?No facility-administered encounter medications on file as of 07/24/2021.  ? ? ?Review of Systems  ?Unable to perform ROS: Dementia  ? ?Immunization History  ?Administered Date(s) Administered  ? Influenza, High Dose Seasonal PF 12/11/2018  ? Influenza-Unspecified 03/24/2018, 12/21/2019, 12/26/2020  ? Moderna Sars-Covid-2 Vaccination 03/12/2019, 04/09/2019, 04/21/2019, 01/17/2020, 08/07/2020  ? Pneumococcal Conjugate-13 09/09/2012, 10/28/2013  ? Pneumococcal Polysaccharide-23 04/02/2009, 04/16/2009  ? Tdap 07/15/2019  ? Zoster, Live 04/16/2009  ? Zoster, Unspecified 04/16/2009  ? ?Pertinent   Health Maintenance Due  ?Topic Date Due  ? DEXA SCAN  Never done  ? INFLUENZA VACCINE  10/08/2021  ? ? ?  11/25/2020  ?  8:00 PM 11/26/2020  ?  9:00 AM 11/26/2020  ?  8:30 PM 11/27/2020  ? 10:00 AM 11/27/2020  ?  8:00 PM  ?Fall Risk  ?Patient Fall Risk Level High fall risk High fall risk High fall risk High fall risk High fall risk  ? ?Functional Status Survey: ?  ? ?Vitals:  ? 07/24/21 1138  ?BP: 115/72  ?Pulse: 72  ?Resp: 18  ?Temp: (!) 97.1 ?F (36.2 ?C)  ?SpO2: 96%  ?Weight: 156 lb 14.4 oz (71.2 kg)  ?Height: '5\' 4"'$  (1.626 m)  ? ?Body mass index is 26.93 kg/m?Marland Kitchen ?Physical Exam ?Vitals reviewed.  ?Constitutional:   ?   General: She is not in acute distress. ?HENT:  ?   Head: Normocephalic.  ?   Right Ear: There is no impacted cerumen.  ?   Left Ear: There is no impacted cerumen.  ?   Ears:  ?  Comments: HOH ?   Nose: Nose normal.  ?   Mouth/Throat:  ?   Mouth: Mucous membranes are moist.  ?Eyes:  ?   General:     ?   Right eye: No discharge.     ?   Left eye: No discharge.  ?Cardiovascular:  ?   Rate and Rhythm: Normal rate and regular rhythm.  ?   Pulses: Normal pulses.  ?   Heart sounds: Normal heart sounds.  ?Pulmonary:  ?   Effort: Pulmonary effort is normal. No respiratory distress.  ?   Breath sounds: Normal breath sounds. No wheezing.  ?Abdominal:  ?   General: Bowel sounds are normal. There is no distension.  ?   Palpations: Abdomen is soft.  ?   Tenderness: There is no abdominal tenderness.  ?Musculoskeletal:  ?   Cervical back: Neck supple.  ?   Right lower leg: Edema present.  ?   Left lower leg: Edema present.  ?   Comments: +1 pitting L>R  ?Skin: ?   General: Skin is warm and dry.  ?   Capillary Refill: Capillary refill takes less than 2 seconds.  ?Neurological:  ?   General: No focal deficit present.  ?   Mental Status: She is alert. Mental status is at baseline.  ?   Motor: Weakness present.  ?   Gait: Gait abnormal.  ?   Comments: walker  ?Psychiatric:     ?   Mood and Affect: Mood normal.     ?    Behavior: Behavior normal.  ?   Comments: Very pleasant, follows commands, alert to self/person/place  ? ? ?Labs reviewed: ?Recent Labs  ?  11/24/20 ?1322 11/24/20 ?1715 11/25/20 ?0955 11/26/20 ?0602 02/20/21 ?0000 04/1

## 2021-07-25 DIAGNOSIS — R2689 Other abnormalities of gait and mobility: Secondary | ICD-10-CM | POA: Diagnosis not present

## 2021-07-25 DIAGNOSIS — M6281 Muscle weakness (generalized): Secondary | ICD-10-CM | POA: Diagnosis not present

## 2021-07-25 DIAGNOSIS — U099 Post covid-19 condition, unspecified: Secondary | ICD-10-CM | POA: Diagnosis not present

## 2021-07-25 DIAGNOSIS — R29898 Other symptoms and signs involving the musculoskeletal system: Secondary | ICD-10-CM | POA: Diagnosis not present

## 2021-07-25 DIAGNOSIS — N3946 Mixed incontinence: Secondary | ICD-10-CM | POA: Diagnosis not present

## 2021-07-26 DIAGNOSIS — M6281 Muscle weakness (generalized): Secondary | ICD-10-CM | POA: Diagnosis not present

## 2021-07-26 DIAGNOSIS — R29898 Other symptoms and signs involving the musculoskeletal system: Secondary | ICD-10-CM | POA: Diagnosis not present

## 2021-07-26 DIAGNOSIS — N3946 Mixed incontinence: Secondary | ICD-10-CM | POA: Diagnosis not present

## 2021-07-26 DIAGNOSIS — R2689 Other abnormalities of gait and mobility: Secondary | ICD-10-CM | POA: Diagnosis not present

## 2021-07-26 DIAGNOSIS — U099 Post covid-19 condition, unspecified: Secondary | ICD-10-CM | POA: Diagnosis not present

## 2021-07-29 DIAGNOSIS — U099 Post covid-19 condition, unspecified: Secondary | ICD-10-CM | POA: Diagnosis not present

## 2021-07-29 DIAGNOSIS — N3946 Mixed incontinence: Secondary | ICD-10-CM | POA: Diagnosis not present

## 2021-07-29 DIAGNOSIS — R29898 Other symptoms and signs involving the musculoskeletal system: Secondary | ICD-10-CM | POA: Diagnosis not present

## 2021-07-29 DIAGNOSIS — M6281 Muscle weakness (generalized): Secondary | ICD-10-CM | POA: Diagnosis not present

## 2021-07-29 DIAGNOSIS — R2689 Other abnormalities of gait and mobility: Secondary | ICD-10-CM | POA: Diagnosis not present

## 2021-07-30 DIAGNOSIS — U099 Post covid-19 condition, unspecified: Secondary | ICD-10-CM | POA: Diagnosis not present

## 2021-07-30 DIAGNOSIS — R2689 Other abnormalities of gait and mobility: Secondary | ICD-10-CM | POA: Diagnosis not present

## 2021-07-30 DIAGNOSIS — M6281 Muscle weakness (generalized): Secondary | ICD-10-CM | POA: Diagnosis not present

## 2021-07-30 DIAGNOSIS — R29898 Other symptoms and signs involving the musculoskeletal system: Secondary | ICD-10-CM | POA: Diagnosis not present

## 2021-07-30 DIAGNOSIS — N3946 Mixed incontinence: Secondary | ICD-10-CM | POA: Diagnosis not present

## 2021-07-31 DIAGNOSIS — M6281 Muscle weakness (generalized): Secondary | ICD-10-CM | POA: Diagnosis not present

## 2021-07-31 DIAGNOSIS — N3946 Mixed incontinence: Secondary | ICD-10-CM | POA: Diagnosis not present

## 2021-07-31 DIAGNOSIS — U099 Post covid-19 condition, unspecified: Secondary | ICD-10-CM | POA: Diagnosis not present

## 2021-07-31 DIAGNOSIS — R2689 Other abnormalities of gait and mobility: Secondary | ICD-10-CM | POA: Diagnosis not present

## 2021-07-31 DIAGNOSIS — R29898 Other symptoms and signs involving the musculoskeletal system: Secondary | ICD-10-CM | POA: Diagnosis not present

## 2021-08-02 DIAGNOSIS — N3946 Mixed incontinence: Secondary | ICD-10-CM | POA: Diagnosis not present

## 2021-08-02 DIAGNOSIS — R29898 Other symptoms and signs involving the musculoskeletal system: Secondary | ICD-10-CM | POA: Diagnosis not present

## 2021-08-02 DIAGNOSIS — U099 Post covid-19 condition, unspecified: Secondary | ICD-10-CM | POA: Diagnosis not present

## 2021-08-02 DIAGNOSIS — R2689 Other abnormalities of gait and mobility: Secondary | ICD-10-CM | POA: Diagnosis not present

## 2021-08-02 DIAGNOSIS — M6281 Muscle weakness (generalized): Secondary | ICD-10-CM | POA: Diagnosis not present

## 2021-08-06 DIAGNOSIS — N3946 Mixed incontinence: Secondary | ICD-10-CM | POA: Diagnosis not present

## 2021-08-06 DIAGNOSIS — M6281 Muscle weakness (generalized): Secondary | ICD-10-CM | POA: Diagnosis not present

## 2021-08-06 DIAGNOSIS — R29898 Other symptoms and signs involving the musculoskeletal system: Secondary | ICD-10-CM | POA: Diagnosis not present

## 2021-08-06 DIAGNOSIS — R2689 Other abnormalities of gait and mobility: Secondary | ICD-10-CM | POA: Diagnosis not present

## 2021-08-06 DIAGNOSIS — U099 Post covid-19 condition, unspecified: Secondary | ICD-10-CM | POA: Diagnosis not present

## 2021-08-07 DIAGNOSIS — M6281 Muscle weakness (generalized): Secondary | ICD-10-CM | POA: Diagnosis not present

## 2021-08-07 DIAGNOSIS — R29898 Other symptoms and signs involving the musculoskeletal system: Secondary | ICD-10-CM | POA: Diagnosis not present

## 2021-08-07 DIAGNOSIS — R2689 Other abnormalities of gait and mobility: Secondary | ICD-10-CM | POA: Diagnosis not present

## 2021-08-07 DIAGNOSIS — N3946 Mixed incontinence: Secondary | ICD-10-CM | POA: Diagnosis not present

## 2021-08-07 DIAGNOSIS — U099 Post covid-19 condition, unspecified: Secondary | ICD-10-CM | POA: Diagnosis not present

## 2021-08-20 ENCOUNTER — Non-Acute Institutional Stay (SKILLED_NURSING_FACILITY): Payer: Medicare Other | Admitting: Internal Medicine

## 2021-08-20 ENCOUNTER — Encounter: Payer: Self-pay | Admitting: Internal Medicine

## 2021-08-20 DIAGNOSIS — R569 Unspecified convulsions: Secondary | ICD-10-CM

## 2021-08-20 DIAGNOSIS — R9389 Abnormal findings on diagnostic imaging of other specified body structures: Secondary | ICD-10-CM | POA: Diagnosis not present

## 2021-08-20 DIAGNOSIS — E039 Hypothyroidism, unspecified: Secondary | ICD-10-CM | POA: Diagnosis not present

## 2021-08-20 DIAGNOSIS — Z8673 Personal history of transient ischemic attack (TIA), and cerebral infarction without residual deficits: Secondary | ICD-10-CM | POA: Diagnosis not present

## 2021-08-20 DIAGNOSIS — R609 Edema, unspecified: Secondary | ICD-10-CM

## 2021-08-20 DIAGNOSIS — I10 Essential (primary) hypertension: Secondary | ICD-10-CM | POA: Diagnosis not present

## 2021-08-20 DIAGNOSIS — G609 Hereditary and idiopathic neuropathy, unspecified: Secondary | ICD-10-CM

## 2021-08-20 DIAGNOSIS — N1831 Chronic kidney disease, stage 3a: Secondary | ICD-10-CM

## 2021-08-20 DIAGNOSIS — M109 Gout, unspecified: Secondary | ICD-10-CM

## 2021-08-20 DIAGNOSIS — F01B0 Vascular dementia, moderate, without behavioral disturbance, psychotic disturbance, mood disturbance, and anxiety: Secondary | ICD-10-CM | POA: Diagnosis not present

## 2021-08-20 NOTE — Progress Notes (Unsigned)
Location:   [facility] Room Number: 031 Place of Service:  SNF [phone]) Provider:  Veleta Miners MD  Virgie Dad, MD  Patient Care Team: Virgie Dad, MD as PCP - General (Internal Medicine) Mast, Man X, NP as Nurse Practitioner (Internal Medicine) Virgie Dad, MD as Consulting Physician (Internal Medicine) Virgie Dad, MD (Internal Medicine)  Extended Emergency Contact Information Primary Emergency Contact: Jeannine Kitten of [place] Phone: [phone] Relation: Son Secondary Emergency Contact: Desantis,[name] Mobile Phone: [phone] Relation: Son  Code Status:  DNR Goals of care: Advanced Directive information    08/20/2021    1:45 PM  Advanced Directives  Does Patient Have a Medical Advance Directive? Yes  Type of Paramedic of [place];Out of facility DNR (pink MOST or yellow form)  Does patient want to make changes to medical advance directive? No - Patient declined  Copy of [place] in Chart? Yes - validated most recent copy scanned in chart (See row information)  Pre-existing out of facility DNR order (yellow form or pink MOST form) Pink MOST form placed in chart (order not valid for inpatient use)     Chief Complaint  Patient presents with   Medical Management of Chronic Issues   Quality Metric Gaps    Verified Matrix and [facility] patient is due for: Zoster Vaccines- Shingrix (1 of 2)  DEXA SCAN (Once)       HPI:  Pt is a 88 y.o. female seen today for medical management of chronic diseases.    Lives in SNF  Patient has a history of hypertension, gout, hypothyroidism and peripheral neuropathy and Osteoporosis  Cognitive impairment due to Vascular Dementia H/o Recurent Falls with CT scan of had showed new remote infarcts in Left Parietal area and Periventricular area H/o TIA Lumbar Compression Fracture at L3 and at T 5 and T 8 Cervical spine Narrowing Admitted to  the hospital 9/17-9/21 for Seizure Like activity Also Nodular Density in Chest Xray Family did not want CT scan     She is stable. No new Nursing issues. No Behavior issues Walks with her walker No Falls Has gained weight Wt Readings from Last 3 Encounters:  08/20/21 162 lb 8 oz (73.7 kg)  07/24/21 156 lb 14.4 oz (71.2 kg)  06/10/21 152 lb 6.4 oz (69.1 kg)  Her only issue today was Burning Pain in sole of her foot especially at night No tingling No Numbness  Past Medical History:  Diagnosis Date   Cognitive changes    Dementia ([place])    Depression    Gout    Hypertension    Hypothyroidism    Peripheral neuropathy    History reviewed. No pertinent surgical history.  Allergies  Allergen Reactions   Actonel [Risedronate Sodium]    Actonel [Risedronate] Other (See Comments)    "Allergic," per MAR   Hct [Hydrochlorothiazide]    Hydrochlorothiazide Other (See Comments)    "Allergic," per MAR   Lipitor [Atorvastatin Calcium]    Lipitor [Atorvastatin] Other (See Comments)    "Allergic," per [facility]   Neomycin Other (See Comments)    Unknown "been so long ago, a Dermatologist told me"   Neomycin Other (See Comments)    "Allergic," per [facility]   Polysporin [Bacitracin-Polymyxin B]    Polysporin [Bacitracin-Polymyxin B] Other (See Comments)    "Allergic," per [facility]   Prolia [Denosumab]    Prolia [Denosumab] Other (See Comments)   Sulfa Antibiotics  Sulfa Antibiotics Other (See Comments)    "Allergic," per MAR   Zocor [Simvastatin]    Zocor [Simvastatin] Other (See Comments)    "Allergic," per [facility]    Allergies as of 08/20/2021       Reactions   Actonel [risedronate Sodium]    Actonel [risedronate] Other (See Comments)   "Allergic," per MAR   Hct [hydrochlorothiazide]    Hydrochlorothiazide Other (See Comments)   "Allergic," per MAR   Lipitor [atorvastatin Calcium]    Lipitor [atorvastatin] Other (See Comments)   "Allergic," per [facility]   Neomycin Other (See Comments)    Unknown "been so long ago, a Dermatologist told me"   Neomycin Other (See Comments)   "Allergic," per [facility]   Polysporin [bacitracin-polymyxin B]    Polysporin [bacitracin-polymyxin B] Other (See Comments)   "Allergic," per [facility]   Prolia [denosumab]    Prolia [denosumab] Other (See Comments)   Sulfa Antibiotics    Sulfa Antibiotics Other (See Comments)   "Allergic," per [facility]   Zocor [simvastatin]    Zocor [simvastatin] Other (See Comments)   "Allergic," per [facility]        Medication List        Accurate as of August 20, 2021  1:45 PM. If you have any questions, ask your nurse or doctor.          acetaminophen 325 MG tablet Commonly known as: TYLENOL Take 650 mg by mouth in the morning, at noon, and at bedtime.   allopurinol 100 MG tablet Commonly known as: ZYLOPRIM Take 100 mg by mouth daily.   amLODipine 2.5 MG tablet Commonly known as: NORVASC Take 2.5 mg by mouth daily. What changed: Another medication with the same name was removed. Continue taking this medication, and follow the directions you see here. Changed by: Virgie Dad, MD   aspirin 81 MG chewable tablet Chew 81 mg by mouth daily.   benazepril 10 MG tablet Commonly known as: LOTENSIN Take 10 mg by mouth in the morning.   Calcium Carb-Cholecalciferol 500-400 MG-UNIT Tabs Take 2 tablets by mouth daily.   Co Q-10 100 MG Caps Take 200 mg by mouth in the morning.   gabapentin 100 MG capsule Commonly known as: NEURONTIN Take 100 mg by mouth at bedtime.   levothyroxine 100 MCG tablet Commonly known as: SYNTHROID Take 100 mcg by mouth once a week. On Wednesday   levothyroxine 50 MCG tablet Commonly known as: SYNTHROID Take 50 mcg by mouth daily before breakfast. Sun,Mon,Tue, Thur,Fri,Sat   meclizine 12.5 MG tablet Commonly known as: ANTIVERT Take 12.5 mg by mouth 2 (two) times daily as needed for dizziness.   memantine 10 MG tablet Commonly known as: NAMENDA Take 10 mg by mouth 2 (two) times  daily.   Omega III EPA+DHA 1000 MG Caps Take 2,000 mg by mouth 2 (two) times daily.   Vitamin D 50 MCG (2000 UT) Caps Take 2,000 Units by mouth daily.        Review of Systems  Constitutional:  Negative for activity change and appetite change.  HENT: Negative.    Respiratory:  Negative for cough and shortness of breath.   Cardiovascular:  Negative for leg swelling.  Gastrointestinal:  Negative for constipation.  Genitourinary: Negative.   Musculoskeletal:  Negative for arthralgias, gait problem and myalgias.  Skin: Negative.   Neurological:  Negative for dizziness and weakness.  Psychiatric/Behavioral:  Positive for confusion. Negative for dysphoric mood and sleep disturbance.     Immunization History  Administered  Date(s) Administered   Influenza, High Dose Seasonal PF 12/11/2018   Influenza-Unspecified 03/24/2018, 12/21/2019, 12/26/2020   Moderna Sars-Covid-2 Vaccination 03/12/2019, 04/09/2019, 04/21/2019, 01/17/2020, 08/07/2020   Pneumococcal Conjugate-13 09/09/2012, 10/28/2013   Pneumococcal Polysaccharide-23 04/02/2009, 04/16/2009   Tdap 07/15/2019   Zoster, Live 04/16/2009   Zoster, Unspecified 04/16/2009   Pertinent  Health Maintenance Due  Topic Date Due   DEXA SCAN  Never done   INFLUENZA VACCINE  10/08/2021      11/25/2020    8:00 PM 11/26/2020    9:00 AM 11/26/2020    8:30 PM 11/27/2020   10:00 AM 11/27/2020    8:00 PM  Fall Risk  Patient Fall Risk Level High fall risk High fall risk High fall risk High fall risk High fall risk   Functional Status Survey:    Vitals:   08/20/21 1156  BP: 134/78  Pulse: 82  Resp: 17  Temp: (!) 97.2 F (36.2 C)  SpO2: 94%  Weight: 162 lb 8 oz (73.7 kg)  Height: '5\' 4"'$  (1.626 m)   Body mass index is 27.89 kg/m. Physical Exam Vitals reviewed.  Constitutional:      Appearance: Normal appearance.  HENT:     Head: Normocephalic.     Nose: Nose normal.     Mouth/Throat:     Mouth: Mucous membranes are moist.      Pharynx: Oropharynx is clear.  Eyes:     Pupils: Pupils are equal, round, and reactive to light.  Cardiovascular:     Rate and Rhythm: Normal rate and regular rhythm.     Pulses: Normal pulses.     Heart sounds: Normal heart sounds. No murmur heard. Pulmonary:     Effort: Pulmonary effort is normal.     Breath sounds: Normal breath sounds.  Abdominal:     General: Abdomen is flat. Bowel sounds are normal.     Palpations: Abdomen is soft.  Musculoskeletal:     Cervical back: Neck supple.     Comments: Mild swelling bilateral  Skin:    General: Skin is warm.  Neurological:     General: No focal deficit present.     Mental Status: She is alert.  Psychiatric:        Mood and Affect: Mood normal.        Thought Content: Thought content normal.     Labs reviewed: Recent Labs    11/24/20 1322 11/24/20 1715 11/25/20 0955 11/26/20 0602 02/20/21 0000 06/18/21 0000  NA 141  --  141 140 143 142  K 3.0*  --  3.2* 3.8 3.8 3.7  CL 103  --  106 104 108 105  CO2 25  --  24 21*  --  25*  GLUCOSE 107*  --  81 98  --   --   BUN 12  --  9 5* 27* 26*  CREATININE 1.37*  --  0.94 0.85 1.2* 1.2*  CALCIUM 9.1  --  9.0 9.2 26.0* 9.4  MG  --  1.8  --   --   --   --    Recent Labs    11/24/20 1322 02/20/21 0000 06/18/21 0000  AST 22 12* 15  ALT '11 8 12  '$ ALKPHOS 87 79 88  BILITOT 0.7  --   --   PROT 6.3*  --   --   ALBUMIN 3.4* 3.5 3.8   Recent Labs    11/24/20 1322 02/20/21 0000 06/18/21 0000  WBC 12.7* 7.2 7.9  NEUTROABS 10.4*  4,918.00 5,925.00  HGB 13.8 11.8* 12.5  HCT 42.3 35* 37  MCV 101.7*  --   --   PLT 249 208 226   Lab Results  Component Value Date   TSH 2.34 06/22/2020   No results found for: "HGBA1C" No results found for: "CHOL", "HDL", "LDLCALC", "LDLDIRECT", "TRIG", "CHOLHDL"  Significant Diagnostic Results in last 30 days:  No results found.  Assessment/Plan 1. Moderate vascular dementia without behavioral disturbance, psychotic disturbance, mood  disturbance, or anxiety (HCC) Continues to do well in SNF setting On Namenda Also on Aspirin  Allergic to statin  2. History of CVA (cerebrovascular accident) On Aspirin Allergic to statin  3. Primary hypertension Controlled on Lisinopril and Norvasc  4. Edema, peripheral Was on Lasix  Not anymore Will continue to monitor  5. Stage 3a chronic kidney disease (HCC) Creat stable  6. Hypothyroidism, unspecified type TSH normal in 04/23 3.95  7. Gout, unspecified cause, unspecified chronicity, unspecified site On Allopurinol  8. Seizure-like activity ([place]) in Past None since 09/22 Per Neurology Most likely post fall No Anti seizure meds right now Tramadol was discontinued  9. Abnormal chest x-[place] Family did not want further work up  10. Idiopathic peripheral neuropathy Change Neurontin to 200 mg at night Also check B 12    Family/ staff Communication:   Labs/tests ordered:  Vit B 12

## 2021-08-29 DIAGNOSIS — D519 Vitamin B12 deficiency anemia, unspecified: Secondary | ICD-10-CM | POA: Diagnosis not present

## 2021-09-09 ENCOUNTER — Non-Acute Institutional Stay (SKILLED_NURSING_FACILITY): Payer: Medicare Other | Admitting: Adult Health

## 2021-09-09 ENCOUNTER — Encounter: Payer: Self-pay | Admitting: Adult Health

## 2021-09-09 DIAGNOSIS — M109 Gout, unspecified: Secondary | ICD-10-CM | POA: Diagnosis not present

## 2021-09-09 DIAGNOSIS — E039 Hypothyroidism, unspecified: Secondary | ICD-10-CM

## 2021-09-09 DIAGNOSIS — G609 Hereditary and idiopathic neuropathy, unspecified: Secondary | ICD-10-CM

## 2021-09-09 DIAGNOSIS — F01B0 Vascular dementia, moderate, without behavioral disturbance, psychotic disturbance, mood disturbance, and anxiety: Secondary | ICD-10-CM

## 2021-09-09 DIAGNOSIS — I10 Essential (primary) hypertension: Secondary | ICD-10-CM

## 2021-09-09 NOTE — Progress Notes (Unsigned)
Location:  [facility] Room Number: N031/A Place of Service:  SNF (31) Provider:  Durenda Age, DNP, FNP-BC  Patient Care Team: Virgie Dad, MD as PCP - General (Internal Medicine) Mast, Man X, NP as Nurse Practitioner (Internal Medicine) Virgie Dad, MD as Consulting Physician (Internal Medicine) Virgie Dad, MD (Internal Medicine)  Extended Emergency Contact Information Primary Emergency Contact: Jeannine Kitten of [employer] Phone: [phone] Relation: Son Secondary Emergency Contact: Drummond,[name] Mobile Phone: [phone] Relation: Son  Code Status:  DNR  Goals of care: Advanced Directive information    09/09/2021    3:08 PM  Advanced Directives  Does Patient Have a Medical Advance Directive? Yes  Type of Paramedic of [place];Out of facility DNR (pink MOST or yellow form)  Does patient want to make changes to medical advance directive? No - Patient declined  Copy of [place] in Chart? Yes - validated most recent copy scanned in chart (See row information)  Pre-existing out of facility DNR order (yellow form or pink MOST form) Pink MOST form placed in chart (order not valid for inpatient use)     Chief Complaint  Patient presents with   Medical Management of Chronic Issues    Routine visit.    Quality Metric Gaps    Discuss the need for Shingrix vaccine, and Dexa scan, or post pone if patient refuses.     HPI:  Pt is a 88 y.o. female seen today for medical management of chronic diseases.  ***   Past Medical History:  Diagnosis Date   Cognitive changes    Dementia ([place])    Depression    Gout    Hypertension    Hypothyroidism    Peripheral neuropathy    History reviewed. No pertinent surgical history.  Allergies  Allergen Reactions   Actonel [Risedronate Sodium]    Actonel [Risedronate] Other (See Comments)    "Allergic," per [facility]   Hct  [Hydrochlorothiazide]    Hydrochlorothiazide Other (See Comments)    "Allergic," per [facility]   Lipitor [Atorvastatin Calcium]    Lipitor [Atorvastatin] Other (See Comments)    "Allergic," per [facility]   Neomycin Other (See Comments)    Unknown "been so long ago, a Dermatologist told me"   Neomycin Other (See Comments)    "Allergic," per [facility]   Polysporin [Bacitracin-Polymyxin B]    Polysporin [Bacitracin-Polymyxin B] Other (See Comments)    "Allergic," per MAR   Prolia [Denosumab]    Prolia [Denosumab] Other (See Comments)   Sulfa Antibiotics    Sulfa Antibiotics Other (See Comments)    "Allergic," per MAR   Zocor [Simvastatin]    Zocor [Simvastatin] Other (See Comments)    "Allergic," per [facility]    Outpatient Encounter Medications as of 09/09/2021  Medication Sig   acetaminophen (TYLENOL) 325 MG tablet Take 650 mg by mouth in the morning, at noon, and at bedtime.   allopurinol (ZYLOPRIM) 100 MG tablet Take 100 mg by mouth daily.   amLODipine (NORVASC) 2.5 MG tablet Take 2.5 mg by mouth daily.   aspirin 81 MG chewable tablet Chew 81 mg by mouth daily.   benazepril (LOTENSIN) 10 MG tablet Take 10 mg by mouth in the morning.   Calcium Carb-Cholecalciferol 500-400 MG-UNIT TABS Take 2 tablets by mouth daily.    Cholecalciferol (VITAMIN D) 50 MCG (2000 UT) CAPS Take 2,000 Units by mouth daily.   Coenzyme Q10 (CO Q-10) 100 MG CAPS Take  200 mg by mouth in the morning.   gabapentin (NEURONTIN) 100 MG capsule Take 200 mg by mouth at bedtime.   levothyroxine (SYNTHROID) 100 MCG tablet Take 100 mcg by mouth once a week. On Wednesday   levothyroxine (SYNTHROID) 50 MCG tablet Take 50 mcg by mouth daily before breakfast. Sun,Mon,Tue, Thur,Fri,Sat   meclizine (ANTIVERT) 12.5 MG tablet Take 12.5 mg by mouth 2 (two) times daily as needed for dizziness.   memantine (NAMENDA) 10 MG tablet Take 10 mg by mouth 2 (two) times daily.   Omega-3 Fatty Acids (OMEGA III EPA+DHA) 1000 MG CAPS Take 2,000 mg by mouth 2  (two) times daily.   No facility-administered encounter medications on file as of 09/09/2021.    Review of Systems ***    Immunization History  Administered Date(s) Administered   Influenza, High Dose Seasonal PF 12/11/2018   Influenza-Unspecified 03/24/2018, 12/21/2019, 12/26/2020   Moderna Sars-Covid-2 Vaccination 03/12/2019, 04/09/2019, 04/21/2019, 01/17/2020, 08/07/2020   Pneumococcal Conjugate-13 09/09/2012, 10/28/2013   Pneumococcal Polysaccharide-23 04/02/2009, 04/16/2009   Tdap 07/15/2019   Zoster, Live 04/16/2009   Zoster, Unspecified 04/16/2009   Pertinent  Health Maintenance Due  Topic Date Due   DEXA SCAN  Never done   INFLUENZA VACCINE  10/08/2021      11/25/2020    8:00 PM 11/26/2020    9:00 AM 11/26/2020    8:30 PM 11/27/2020   10:00 AM 11/27/2020    8:00 PM  Fall Risk  Patient Fall Risk Level High fall risk High fall risk High fall risk High fall risk High fall risk     Vitals:   09/09/21 1500  BP: 120/60  Pulse: 76  Resp: 17  Temp: 97.6 F (36.4 C)  SpO2: 96%  Weight: 164 lb 9.6 oz (74.7 kg)  Height: '5\' 4"'$  (1.626 m)   Body mass index is 28.25 kg/m.  Physical Exam     Labs reviewed: Recent Labs    11/24/20 1322 11/24/20 1715 11/25/20 0955 11/26/20 0602 02/20/21 0000 06/18/21 0000  NA 141  --  141 140 143 142  K 3.0*  --  3.2* 3.8 3.8 3.7  CL 103  --  106 104 108 105  CO2 25  --  24 21*  --  25*  GLUCOSE 107*  --  81 98  --   --   BUN 12  --  9 5* 27* 26*  CREATININE 1.37*  --  0.94 0.85 1.2* 1.2*  CALCIUM 9.1  --  9.0 9.2 26.0* 9.4  MG  --  1.8  --   --   --   --    Recent Labs    11/24/20 1322 02/20/21 0000 06/18/21 0000  AST 22 12* 15  ALT '11 8 12  '$ ALKPHOS 87 79 88  BILITOT 0.7  --   --   PROT 6.3*  --   --   ALBUMIN 3.4* 3.5 3.8   Recent Labs    11/24/20 1322 02/20/21 0000 06/18/21 0000  WBC 12.7* 7.2 7.9  NEUTROABS 10.4* 4,918.00 5,925.00  HGB 13.8 11.8* 12.5  HCT 42.3 35* 37  MCV 101.7*  --   --   PLT 249  208 226   Lab Results  Component Value Date   TSH 2.34 06/22/2020   No results found for: "HGBA1C" No results found for: "CHOL", "HDL", "LDLCALC", "LDLDIRECT", "TRIG", "CHOLHDL"  Significant Diagnostic Results in last 30 days:  No results found.  Assessment/Plan ***   Family/ staff Communication: Discussed plan  of care with resident and charge nurse  Labs/tests ordered:     Durenda Age, DNP, MSN, FNP-BC [facility] [phone] (Monday-Friday 8:00 a.m. - 5:00 p.m.) [phone] (after hours)

## 2021-09-29 ENCOUNTER — Telehealth: Payer: Self-pay | Admitting: Family

## 2021-09-29 NOTE — Telephone Encounter (Signed)
Facility nurse reports patient fell on 09/27/2021.  Patient complaining of right-sided pain had to adjust chair in the dining but patient tends to lean on the left side due to pain on the right.  Currently on Tylenol around-the-clock.  Order given for portable x-[place] to evaluate right rib cage rule out fracture.  Please follow-up.

## 2021-09-30 ENCOUNTER — Non-Acute Institutional Stay (SKILLED_NURSING_FACILITY): Payer: Medicare Other | Admitting: Orthopedic Surgery

## 2021-09-30 ENCOUNTER — Encounter: Payer: Self-pay | Admitting: Orthopedic Surgery

## 2021-09-30 DIAGNOSIS — R600 Localized edema: Secondary | ICD-10-CM

## 2021-09-30 DIAGNOSIS — I517 Cardiomegaly: Secondary | ICD-10-CM | POA: Diagnosis not present

## 2021-09-30 DIAGNOSIS — R609 Edema, unspecified: Secondary | ICD-10-CM

## 2021-09-30 DIAGNOSIS — R079 Chest pain, unspecified: Secondary | ICD-10-CM

## 2021-09-30 DIAGNOSIS — J810 Acute pulmonary edema: Secondary | ICD-10-CM | POA: Diagnosis not present

## 2021-09-30 DIAGNOSIS — I509 Heart failure, unspecified: Secondary | ICD-10-CM | POA: Diagnosis not present

## 2021-09-30 DIAGNOSIS — R9389 Abnormal findings on diagnostic imaging of other specified body structures: Secondary | ICD-10-CM

## 2021-09-30 NOTE — Progress Notes (Signed)
Location:  [place] Room Number: NO/31/A Place of Service:  SNF (31) Provider:  Windell Moulding, NP  Virgie Dad, MD  Patient Care Team: Virgie Dad, MD as PCP - General (Internal Medicine) Mast, Man X, NP as Nurse Practitioner (Internal Medicine) Virgie Dad, MD as Consulting Physician (Internal Medicine) Virgie Dad, MD (Internal Medicine)  Extended Emergency Contact Information Primary Emergency Contact: Jeannine Kitten of [place] Phone: [phone] Relation: Son Secondary Emergency Contact: Hussein,[name] Mobile Phone: [phone] Relation: Son  Code Status:  DNR Goals of care: Advanced Directive information    09/30/2021    2:36 PM  Advanced Directives  Does Patient Have a Medical Advance Directive? Yes  Type of Paramedic of [place];Out of facility DNR (pink MOST or yellow form)  Does patient want to make changes to medical advance directive? No - Patient declined  Copy of [place] in Chart? Yes - validated most recent copy scanned in chart (See row information)  Pre-existing out of facility DNR order (yellow form or pink MOST form) Pink MOST form placed in chart (order not valid for inpatient use)     Chief Complaint  Patient presents with   Acute Visit    Patient is being seen for right side chest pain    HPI:  Pt is a 88 y.o. female seen today for an acute visit for right sided pain.   She currently resides on the skilled nursing unit at [facility] due to dementia. 07/23 she c/o right sided pain. 07/24 CXR negative for fracture, congestive heart failure noted. She points under her right breast and right axilla for pain. Unable to provide any other details. See weight trends below. H/o bilateral ankle edema. She is not on diuretics at this time. 2D echo 06/2008 LVEF 60%. Vitals stable.   Recent weights:  07/01- 164.6 lbs  06/02- 162.5 lbs  05/01- 156.9  lbs  04/01- 152.4 lbs  03/01- 145.4 lbs   Past Medical History:  Diagnosis Date   Cognitive changes    Dementia (HCC)    Depression    Gout    Hypertension    Hypothyroidism    Peripheral neuropathy    History reviewed. No pertinent surgical history.  Allergies  Allergen Reactions   Actonel [Risedronate Sodium]    Actonel [Risedronate] Other (See Comments)    "Allergic," per MAR   Hct [Hydrochlorothiazide]    Hydrochlorothiazide Other (See Comments)    "Allergic," per MAR   Lipitor [Atorvastatin Calcium]    Lipitor [Atorvastatin] Other (See Comments)    "Allergic," per MAR   Neomycin Other (See Comments)    Unknown "been so long ago, a Dermatologist told me"   Neomycin Other (See Comments)    "Allergic," per [facility]   Polysporin [Bacitracin-Polymyxin B]    Polysporin [Bacitracin-Polymyxin B] Other (See Comments)    "Allergic," per MAR   Prolia [Denosumab]    Prolia [Denosumab] Other (See Comments)   Sulfa Antibiotics    Sulfa Antibiotics Other (See Comments)    "Allergic," per MAR   Zocor [Simvastatin]    Zocor [Simvastatin] Other (See Comments)    "Allergic," per [facility]    Outpatient Encounter Medications as of 09/30/2021  Medication Sig   acetaminophen (TYLENOL) 325 MG tablet Take 650 mg by mouth in the morning, at noon, and at bedtime.   allopurinol (ZYLOPRIM) 100 MG tablet Take 100 mg by mouth daily.   amLODipine (NORVASC)  2.5 MG tablet Take 2.5 mg by mouth daily.   aspirin 81 MG chewable tablet Chew 81 mg by mouth daily.   benazepril (LOTENSIN) 10 MG tablet Take 10 mg by mouth in the morning.   Calcium Carb-Cholecalciferol 500-400 MG-UNIT TABS Take 2 tablets by mouth daily.    Cholecalciferol (VITAMIN D) 50 MCG (2000 UT) CAPS Take 2,000 Units by mouth daily.   Coenzyme Q10 (CO Q-10) 100 MG CAPS Take 200 mg by mouth in the morning.   gabapentin (NEURONTIN) 100 MG capsule Take 200 mg by mouth at bedtime.   levothyroxine (SYNTHROID) 100 MCG tablet Take 100 mcg by  mouth once a week. On Wednesday   levothyroxine (SYNTHROID) 50 MCG tablet Take 50 mcg by mouth daily before breakfast. Sun,Mon,Tue, Thur,Fri,Sat   meclizine (ANTIVERT) 12.5 MG tablet Take 12.5 mg by mouth 2 (two) times daily as needed for dizziness.   memantine (NAMENDA) 10 MG tablet Take 10 mg by mouth 2 (two) times daily.   Omega-3 Fatty Acids (OMEGA III EPA+DHA) 1000 MG CAPS Take 2,000 mg by mouth 2 (two) times daily.   No facility-administered encounter medications on file as of 09/30/2021.    Review of Systems  Unable to perform ROS: Dementia    Immunization History  Administered Date(s) Administered   Influenza, High Dose Seasonal PF 12/11/2018   Influenza-Unspecified 03/24/2018, 12/21/2019, 12/26/2020   Moderna Sars-Covid-2 Vaccination 03/12/2019, 04/09/2019, 04/21/2019, 01/17/2020, 08/07/2020   Pneumococcal Conjugate-13 09/09/2012, 10/28/2013   Pneumococcal Polysaccharide-23 04/02/2009, 04/16/2009   Tdap 07/15/2019   Zoster, Live 04/16/2009   Zoster, Unspecified 04/16/2009   Pertinent  Health Maintenance Due  Topic Date Due   DEXA SCAN  Never done   INFLUENZA VACCINE  10/08/2021      11/25/2020    8:00 PM 11/26/2020    9:00 AM 11/26/2020    8:30 PM 11/27/2020   10:00 AM 11/27/2020    8:00 PM  Fall Risk  Patient Fall Risk Level High fall risk High fall risk High fall risk High fall risk High fall risk   Functional Status Survey:    Vitals:   09/30/21 1427  BP: 128/76  Pulse: 78  Resp: 16  Temp: 97.7 F (36.5 C)  SpO2: 94%  Weight: 164 lb (74.4 kg)  Height: '5\' 4"'$  (1.626 m)   Body mass index is 28.15 kg/m. Physical Exam Vitals reviewed.  Constitutional:      General: She is not in acute distress. HENT:     Head: Normocephalic.  Eyes:     General:        Right eye: No discharge.        Left eye: No discharge.  Cardiovascular:     Rate and Rhythm: Normal rate and regular rhythm.     Pulses: Normal pulses.     Heart sounds: Normal heart sounds.   Pulmonary:     Effort: Pulmonary effort is normal. No respiratory distress.     Breath sounds: Normal breath sounds. No wheezing or rales.  Chest:     Chest wall: No deformity, swelling or tenderness.  Abdominal:     General: Bowel sounds are normal. There is no distension.     Palpations: Abdomen is soft. There is no mass.     Tenderness: There is no abdominal tenderness. There is no guarding or rebound.     Hernia: No hernia is present.  Musculoskeletal:     Cervical back: Neck supple.     Right lower leg: Edema present.  Left lower leg: Edema present.     Comments: 1+ pitting  Skin:    General: Skin is warm and dry.     Capillary Refill: Capillary refill takes less than 2 seconds.  Neurological:     General: No focal deficit present.     Mental Status: She is alert. Mental status is at baseline.     Motor: Weakness present.     Gait: Gait abnormal.  Psychiatric:        Mood and Affect: Mood normal.        Behavior: Behavior normal.     Comments: Follows commands, alert to self     Labs reviewed: Recent Labs    11/24/20 1322 11/24/20 1715 11/25/20 0955 11/26/20 0602 02/20/21 0000 06/18/21 0000  NA 141  --  141 140 143 142  K 3.0*  --  3.2* 3.8 3.8 3.7  CL 103  --  106 104 108 105  CO2 25  --  24 21*  --  25*  GLUCOSE 107*  --  81 98  --   --   BUN 12  --  9 5* 27* 26*  CREATININE 1.37*  --  0.94 0.85 1.2* 1.2*  CALCIUM 9.1  --  9.0 9.2 26.0* 9.4  MG  --  1.8  --   --   --   --    Recent Labs    11/24/20 1322 02/20/21 0000 06/18/21 0000  AST 22 12* 15  ALT '11 8 12  '$ ALKPHOS 87 79 88  BILITOT 0.7  --   --   PROT 6.3*  --   --   ALBUMIN 3.4* 3.5 3.8   Recent Labs    11/24/20 1322 02/20/21 0000 06/18/21 0000  WBC 12.7* 7.2 7.9  NEUTROABS 10.4* 4,918.00 5,925.00  HGB 13.8 11.8* 12.5  HCT 42.3 35* 37  MCV 101.7*  --   --   PLT 249 208 226   Lab Results  Component Value Date   TSH 2.34 06/22/2020   No results found for: "HGBA1C" No results  found for: "CHOL", "HDL", "LDLCALC", "LDLDIRECT", "TRIG", "CHOLHDL"  Significant Diagnostic Results in last 30 days:  No results found.  Assessment/Plan 1. Right-sided chest pain - onset 07/23 - CXR 07/24 negative for fracture - exam unremarkable - start lidocaine 4% patch daily x 7 days- apply to right side - cbc/diff, cmp  2. Abnormal chest x-[place] - 07/24 CXR indicated congestive heart failure - 2D echo 06/2008 LVEF 60% - weights trending up, 1+ pitting edema - bnp  3. Edema, peripheral - not on diuretics - was on Lasix in past    Family/ staff Communication: plan discussed with patient and nurse  Labs/tests ordered:  cbc/diff, cmp, BNP

## 2021-10-01 DIAGNOSIS — E039 Hypothyroidism, unspecified: Secondary | ICD-10-CM | POA: Diagnosis not present

## 2021-10-01 DIAGNOSIS — R600 Localized edema: Secondary | ICD-10-CM | POA: Diagnosis not present

## 2021-10-01 DIAGNOSIS — I10 Essential (primary) hypertension: Secondary | ICD-10-CM | POA: Diagnosis not present

## 2021-10-01 LAB — HEPATIC FUNCTION PANEL
ALT: 9 U/L (ref 7–35)
AST: 13 (ref 13–35)
Alkaline Phosphatase: 80 (ref 25–125)
Bilirubin, Total: 0.5

## 2021-10-01 LAB — CBC AND DIFFERENTIAL
HCT: 38 (ref 36–46)
Hemoglobin: 12.8 (ref 12.0–16.0)
Neutrophils Absolute: 5469
WBC: 7.4

## 2021-10-01 LAB — COMPREHENSIVE METABOLIC PANEL
Albumin: 3.9 (ref 3.5–5.0)
Calcium: 9.3 (ref 8.7–10.7)
Globulin: 2.8
eGFR: 40

## 2021-10-01 LAB — BASIC METABOLIC PANEL
BUN: 20 (ref 4–21)
CO2: 27 — AB (ref 13–22)
Chloride: 104 (ref 99–108)
Creatinine: 1.3 — AB (ref <=1.1)
Glucose: 67
Potassium: 4 mEq/L (ref 3.5–5.1)
Sodium: 141 (ref 137–147)

## 2021-10-01 LAB — CBC: RBC: 3.88 (ref 3.87–5.11)

## 2021-10-11 ENCOUNTER — Encounter: Payer: Self-pay | Admitting: Nurse Practitioner

## 2021-10-11 ENCOUNTER — Non-Acute Institutional Stay (SKILLED_NURSING_FACILITY): Payer: Medicare Other | Admitting: Nurse Practitioner

## 2021-10-11 DIAGNOSIS — R569 Unspecified convulsions: Secondary | ICD-10-CM | POA: Diagnosis not present

## 2021-10-11 DIAGNOSIS — R609 Edema, unspecified: Secondary | ICD-10-CM

## 2021-10-11 DIAGNOSIS — G609 Hereditary and idiopathic neuropathy, unspecified: Secondary | ICD-10-CM

## 2021-10-11 DIAGNOSIS — M159 Polyosteoarthritis, unspecified: Secondary | ICD-10-CM

## 2021-10-11 DIAGNOSIS — M109 Gout, unspecified: Secondary | ICD-10-CM | POA: Diagnosis not present

## 2021-10-11 DIAGNOSIS — N1831 Chronic kidney disease, stage 3a: Secondary | ICD-10-CM

## 2021-10-11 DIAGNOSIS — I10 Essential (primary) hypertension: Secondary | ICD-10-CM

## 2021-10-11 DIAGNOSIS — G459 Transient cerebral ischemic attack, unspecified: Secondary | ICD-10-CM

## 2021-10-11 DIAGNOSIS — E039 Hypothyroidism, unspecified: Secondary | ICD-10-CM

## 2021-10-11 DIAGNOSIS — R9389 Abnormal findings on diagnostic imaging of other specified body structures: Secondary | ICD-10-CM | POA: Diagnosis not present

## 2021-10-11 DIAGNOSIS — F01B0 Vascular dementia, moderate, without behavioral disturbance, psychotic disturbance, mood disturbance, and anxiety: Secondary | ICD-10-CM | POA: Diagnosis not present

## 2021-10-11 DIAGNOSIS — Z8673 Personal history of transient ischemic attack (TIA), and cerebral infarction without residual deficits: Secondary | ICD-10-CM | POA: Diagnosis not present

## 2021-10-11 DIAGNOSIS — R2681 Unsteadiness on feet: Secondary | ICD-10-CM

## 2021-10-11 NOTE — Assessment & Plan Note (Signed)
Bun/creat 20/1.3 10/01/21

## 2021-10-11 NOTE — Assessment & Plan Note (Signed)
blood pressure is controlled on Benazepril, Amlodipine.  

## 2021-10-11 NOTE — Assessment & Plan Note (Signed)
stable, on Levothyroxine. TSH wnl last checked.

## 2021-10-11 NOTE — Assessment & Plan Note (Signed)
Seizure like activity, likely post fall, off Tramadol, not taking anti seizure meds.  

## 2021-10-11 NOTE — Assessment & Plan Note (Signed)
Hx of TIA. Allergic to statin. On ASA.  HPOA desired not hospital evaluation. Hgb 12.8 10/01/21

## 2021-10-11 NOTE — Assessment & Plan Note (Signed)
Trace, no longer taking diuretics.

## 2021-10-11 NOTE — Assessment & Plan Note (Signed)
Hx of CVA per CT head 08/02/20 Remote infarcts within the a occipital cortices bilaterally again noted. Interval development of remote infarcts within the left parietal lobe and periventricular white matte. On ASA 

## 2021-10-11 NOTE — Progress Notes (Signed)
Location:  [facility] Room Number: NO/31/A Place of Service:  SNF (31) Provider:  Lataya Varnell X, NP Virgie Dad, MD  Patient Care Team: Virgie Dad, MD as PCP - General (Internal Medicine) Marquay Kruse X, NP as Nurse Practitioner (Internal Medicine) Virgie Dad, MD as Consulting Physician (Internal Medicine) Virgie Dad, MD (Internal Medicine)  Extended Emergency Contact Information Primary Emergency Contact: Jeannine Kitten of [place] Phone: [phone] Relation: Son Secondary Emergency Contact: Berber,[name] Mobile Phone: [phone] Relation: Son  Code Status:  DNR Goals of care: Advanced Directive information    10/11/2021    9:20 AM  Advanced Directives  Does Patient Have a Medical Advance Directive? Yes  Type of Paramedic of [place];Out of facility DNR (pink MOST or yellow form)  Does patient want to make changes to medical advance directive? No - Patient declined  Copy of [place] in Chart? Yes - validated most recent copy scanned in chart (See row information)  Pre-existing out of facility DNR order (yellow form or pink MOST form) Pink MOST form placed in chart (order not valid for inpatient use)     Chief Complaint  Patient presents with  . Medical Management of Chronic Issues    Patient is here for a follow up for chronic conditions,patient due for  dexa scan, discuss vaccine update, and fall risk assesment     HPI:  Pt is a 88 y.o. female seen today for managing chronic medical conditions  Peripheral neuropathy, taking Gabapentin, Vit B12 Edema BLE trace Gait abnormality, uses walker to ambulate.               Hx of TIA. Allergic to statin. On ASA.  HPOA desired not hospital evaluation. Hgb 12.8 10/01/21 Seizure like activity, likely post fall, off Tramadol, not taking anti seizure meds.               T5, T8, L3 compression fxs. Takes, Tylenol for pain, off Tramadol  and Lyrica.              DDD per CT head/cervical spine 08/02/20              Hx of CVA per CT head 08/02/20 Remote infarcts within the a occipital cortices bilaterally again noted. Interval development of remote infarcts within the left parietal lobe and periventricular white matte. On ASA             Vascular dementia, 07/15/19 CT head Mild generalized parenchymal atrophy and chronic small vessel ischemic disease, tolerated Memantine well.                          HTN, blood pressure is controlled on Benazepril, Amlodipine.              CKD Bun/creat 20/1.3 10/01/21             Gout stable, on Allopurinol '100mg'$  qd.                        Hypothyroidism, stable, on Levothyroxine. TSH wnl last checked.              Abnormal CXR, suggested CT chest, not candidate for workup    Past Medical History:  Diagnosis Date  . Cognitive changes   . Dementia ([place])   . Depression   . Gout   . Hypertension   .  Hypothyroidism   . Peripheral neuropathy    History reviewed. No pertinent surgical history.  Allergies  Allergen Reactions  . Actonel [Risedronate Sodium]   . Actonel [Risedronate] Other (See Comments)    "Allergic," per MAR  . Hct [Hydrochlorothiazide]   . Hydrochlorothiazide Other (See Comments)    "Allergic," per MAR  . Lipitor [Atorvastatin Calcium]   . Lipitor [Atorvastatin] Other (See Comments)    "Allergic," per MAR  . Neomycin Other (See Comments)    Unknown "been so long ago, a Dermatologist told me"  . Neomycin Other (See Comments)    "Allergic," per MAR  . Polysporin [Bacitracin-Polymyxin B]   . Polysporin [Bacitracin-Polymyxin B] Other (See Comments)    "Allergic," per MAR  . Prolia [Denosumab]   . Prolia [Denosumab] Other (See Comments)  . Sulfa Antibiotics   . Sulfa Antibiotics Other (See Comments)    "Allergic," per MAR  . Zocor [Simvastatin]   . Zocor [Simvastatin] Other (See Comments)    "Allergic," per [facility]    Outpatient Encounter Medications as of 10/11/2021   Medication Sig  . acetaminophen (TYLENOL) 325 MG tablet Take 650 mg by mouth in the morning, at noon, and at bedtime.  Marland Kitchen allopurinol (ZYLOPRIM) 100 MG tablet Take 100 mg by mouth daily.  Marland Kitchen amLODipine (NORVASC) 2.5 MG tablet Take 2.5 mg by mouth daily.  Marland Kitchen aspirin 81 MG chewable tablet Chew 81 mg by mouth daily.  . benazepril (LOTENSIN) 10 MG tablet Take 10 mg by mouth in the morning.  . Calcium Carb-Cholecalciferol 500-400 MG-UNIT TABS Take 2 tablets by mouth daily.   . Cholecalciferol (VITAMIN D) 50 MCG (2000 UT) CAPS Take 2,000 Units by mouth daily.  . Coenzyme Q10 (CO Q-10) 100 MG CAPS Take 200 mg by mouth in the morning.  . gabapentin (NEURONTIN) 100 MG capsule Take 200 mg by mouth at bedtime.  Marland Kitchen levothyroxine (SYNTHROID) 100 MCG tablet Take 100 mcg by mouth once a week. On Wednesday  . levothyroxine (SYNTHROID) 50 MCG tablet Take 50 mcg by mouth daily before breakfast. Sun,Mon,Tue, Thur,Fri,Sat  . meclizine (ANTIVERT) 12.5 MG tablet Take 12.5 mg by mouth 2 (two) times daily as needed for dizziness.  . memantine (NAMENDA) 10 MG tablet Take 10 mg by mouth 2 (two) times daily.  . Omega-3 Fatty Acids (OMEGA III EPA+DHA) 1000 MG CAPS Take 2,000 mg by mouth 2 (two) times daily.   No facility-administered encounter medications on file as of 10/11/2021.    Review of Systems  Unable to perform ROS: Dementia    Immunization History  Administered Date(s) Administered  . Influenza, High Dose Seasonal PF 12/11/2018  . Influenza-Unspecified 03/24/2018, 12/21/2019, 12/26/2020  . Moderna Sars-Covid-2 Vaccination 03/12/2019, 04/09/2019, 04/21/2019, 01/17/2020, 08/07/2020  . Pneumococcal Conjugate-13 09/09/2012, 10/28/2013  . Pneumococcal Polysaccharide-23 04/02/2009, 04/16/2009  . Tdap 07/15/2019  . Zoster, Live 04/16/2009  . Zoster, Unspecified 04/16/2009   Pertinent  Health Maintenance Due  Topic Date Due  . DEXA SCAN  Never done  . INFLUENZA VACCINE  10/08/2021      11/25/2020    8:00  PM 11/26/2020    9:00 AM 11/26/2020    8:30 PM 11/27/2020   10:00 AM 11/27/2020    8:00 PM  Fall Risk  Patient Fall Risk Level High fall risk High fall risk High fall risk High fall risk High fall risk   Functional Status Survey:    Vitals:   10/11/21 0919  BP: (!) 110/50  Pulse: 82  Resp: 16  Temp: (!)  97.1 F (36.2 C)  SpO2: 96%  Weight: 166 lb 6.4 oz (75.5 kg)  Height: '5\' 4"'$  (1.626 m)   Body mass index is 28.56 kg/m. Physical Exam Vitals and nursing note reviewed.  Constitutional:      Appearance: Normal appearance.  HENT:     Head: Normocephalic and atraumatic.     Nose: Nose normal.     Mouth/Throat:     Mouth: Mucous membranes are moist.  Eyes:     Extraocular Movements: Extraocular movements intact.     Conjunctiva/sclera: Conjunctivae normal.     Pupils: Pupils are equal, round, and reactive to light.  Cardiovascular:     Rate and Rhythm: Normal rate and regular rhythm.     Heart sounds: No murmur heard.    Comments: Weak dorsalis pedis pulses R+L Pulmonary:     Effort: Pulmonary effort is normal.     Breath sounds: No rales.  Abdominal:     General: Bowel sounds are normal.     Palpations: Abdomen is soft.     Tenderness: There is no abdominal tenderness.  Musculoskeletal:     Cervical back: Normal range of motion and neck supple.     Right lower leg: Edema present.     Left lower leg: Edema present.     Comments: Trace edema BLE  Skin:    General: Skin is warm and dry.  Neurological:     General: No focal deficit present.     Mental Status: She is alert. Mental status is at baseline.     Motor: No weakness.     Coordination: Coordination normal.     Gait: Gait abnormal.  Psychiatric:        Mood and Affect: Mood normal.        Behavior: Behavior normal.    Labs reviewed: Recent Labs    11/24/20 1322 11/24/20 1715 11/25/20 0955 11/26/20 0602 02/20/21 0000 06/18/21 0000 10/01/21 0000  NA 141  --  141 140 143 142 141  K 3.0*  --  3.2*  3.8 3.8 3.7 4.0  CL 103  --  106 104 108 105 104  CO2 25  --  24 21*  --  25* 27*  GLUCOSE 107*  --  81 98  --   --   --   BUN 12  --  9 5* 27* 26* 20  CREATININE 1.37*  --  0.94 0.85 1.2* 1.2* 1.3*  CALCIUM 9.1  --  9.0 9.2 26.0* 9.4 9.3  MG  --  1.8  --   --   --   --   --    Recent Labs    11/24/20 1322 02/20/21 0000 06/18/21 0000 10/01/21 0000  AST 22 12* 15 13  ALT '11 8 12 9  '$ ALKPHOS 87 79 88 80  BILITOT 0.7  --   --   --   PROT 6.3*  --   --   --   ALBUMIN 3.4* 3.5 3.8 3.9   Recent Labs    11/24/20 1322 02/20/21 0000 06/18/21 0000 10/01/21 0000  WBC 12.7* 7.2 7.9 7.4  NEUTROABS 10.4* 4,918.00 5,925.00 5,469.00  HGB 13.8 11.8* 12.5 12.8  HCT 42.3 35* 37 38  MCV 101.7*  --   --   --   PLT 249 208 226  --    Lab Results  Component Value Date   TSH 2.34 06/22/2020   No results found for: "HGBA1C" No results found for: "CHOL", "HDL", "LDLCALC", "  LDLDIRECT", "TRIG", "CHOLHDL"  Significant Diagnostic Results in last 30 days:  No results found.  Assessment/Plan Hypothyroidism  stable, on Levothyroxine. TSH wnl last checked.   Abnormal chest x-Marban  suggested CT chest, not candidate for workup  Gout No flare ups, on Allopurinol '100mg'$  qd.       CKD (chronic kidney disease) stage 3, GFR 30-59 ml/min (HCC) Bun/creat 20/1.3 10/01/21  HTN (hypertension) blood pressure is controlled on Benazepril, Amlodipine.   Vascular dementia ([place])  07/15/19 CT head Mild generalized parenchymal atrophy and chronic small vessel ischemic disease, tolerated Memantine well.    History of CVA (cerebrovascular accident) Hx of CVA per CT head 08/02/20 Remote infarcts within the a occipital cortices bilaterally again noted. Interval development of remote infarcts within the left parietal lobe and periventricular white matte. On ASA  Generalized osteoarthritis of multiple sites T5, T8, L3 compression fxs. Takes, Tylenol for pain, off Tramadol and Lyrica. DDD per CT head/cervical spine  08/02/20   Seizure-like activity (HCC) Seizure like activity, likely post fall, off Tramadol, not taking anti seizure meds.  TIA (transient ischemic attack) Hx of TIA. Allergic to statin. On ASA.  HPOA desired not hospital evaluation. Hgb 12.8 10/01/21  Gait instability Ambulates with walker slowly.   Edema, peripheral Trace, no longer taking diuretics.   Peripheral neuropathy Taking Gabapentin, tolerated well.      Family/ staff Communication: plan of care reviewed with the patient and charge nurse.   Labs/tests ordered:  none  Time spend 35 minutes.

## 2021-10-11 NOTE — Assessment & Plan Note (Signed)
T5, T8, L3 compression fxs. Takes, Tylenol for pain, off Tramadol and Lyrica. DDD per CT head/cervical spine 08/02/20

## 2021-10-11 NOTE — Assessment & Plan Note (Signed)
Ambulates with walker slowly.

## 2021-10-11 NOTE — Assessment & Plan Note (Signed)
suggested CT chest, not candidate for workup

## 2021-10-11 NOTE — Assessment & Plan Note (Signed)
Taking Gabapentin, tolerated well.

## 2021-10-11 NOTE — Assessment & Plan Note (Signed)
07/15/19 CT head Mild generalized parenchymal atrophy and chronic small vessel ischemic disease, tolerated Memantine well.     

## 2021-10-11 NOTE — Assessment & Plan Note (Signed)
No flare ups, on Allopurinol '100mg'$  qd.

## 2021-10-14 ENCOUNTER — Encounter: Payer: Self-pay | Admitting: Nurse Practitioner

## 2021-10-29 ENCOUNTER — Encounter: Payer: Self-pay | Admitting: Nurse Practitioner

## 2021-10-29 ENCOUNTER — Non-Acute Institutional Stay (SKILLED_NURSING_FACILITY): Payer: Medicare Other | Admitting: Nurse Practitioner

## 2021-10-29 DIAGNOSIS — M109 Gout, unspecified: Secondary | ICD-10-CM

## 2021-10-29 DIAGNOSIS — N1831 Chronic kidney disease, stage 3a: Secondary | ICD-10-CM

## 2021-10-29 DIAGNOSIS — R2681 Unsteadiness on feet: Secondary | ICD-10-CM

## 2021-10-29 DIAGNOSIS — F01B0 Vascular dementia, moderate, without behavioral disturbance, psychotic disturbance, mood disturbance, and anxiety: Secondary | ICD-10-CM

## 2021-10-29 DIAGNOSIS — R059 Cough, unspecified: Secondary | ICD-10-CM | POA: Diagnosis not present

## 2021-10-29 DIAGNOSIS — R569 Unspecified convulsions: Secondary | ICD-10-CM | POA: Diagnosis not present

## 2021-10-29 DIAGNOSIS — Z8673 Personal history of transient ischemic attack (TIA), and cerebral infarction without residual deficits: Secondary | ICD-10-CM

## 2021-10-29 DIAGNOSIS — I509 Heart failure, unspecified: Secondary | ICD-10-CM | POA: Insufficient documentation

## 2021-10-29 DIAGNOSIS — R609 Edema, unspecified: Secondary | ICD-10-CM | POA: Diagnosis not present

## 2021-10-29 DIAGNOSIS — I10 Essential (primary) hypertension: Secondary | ICD-10-CM

## 2021-10-29 DIAGNOSIS — R051 Acute cough: Secondary | ICD-10-CM

## 2021-10-29 DIAGNOSIS — M159 Polyosteoarthritis, unspecified: Secondary | ICD-10-CM | POA: Diagnosis not present

## 2021-10-29 DIAGNOSIS — G609 Hereditary and idiopathic neuropathy, unspecified: Secondary | ICD-10-CM

## 2021-10-29 DIAGNOSIS — E039 Hypothyroidism, unspecified: Secondary | ICD-10-CM

## 2021-10-29 DIAGNOSIS — R0602 Shortness of breath: Secondary | ICD-10-CM | POA: Diagnosis not present

## 2021-10-29 DIAGNOSIS — G459 Transient cerebral ischemic attack, unspecified: Secondary | ICD-10-CM | POA: Diagnosis not present

## 2021-10-29 DIAGNOSIS — R600 Localized edema: Secondary | ICD-10-CM

## 2021-10-29 NOTE — Assessment & Plan Note (Signed)
taking Gabapentin, Vit B12 

## 2021-10-29 NOTE — Assessment & Plan Note (Signed)
blood pressure is controlled on Benazepril, Amlodipine.  

## 2021-10-29 NOTE — Assessment & Plan Note (Signed)
stable, on Allopurinol       

## 2021-10-29 NOTE — Assessment & Plan Note (Signed)
stable, on Levothyroxine. TSH 3.95 06/18/21  

## 2021-10-29 NOTE — Assessment & Plan Note (Signed)
Bun/creat 20/1.3 10/01/21

## 2021-10-29 NOTE — Assessment & Plan Note (Signed)
uses walker to ambulate.

## 2021-10-29 NOTE — Assessment & Plan Note (Signed)
wheezing cough, congestive cough, afebrile, no O2 desaturation, denied sore throat or chest pain. The patient was noted to have more swelling than usual. 09/30/21 CXR mild congestive heart failure.    Will obtain CXR ap/lateral, DuoNeb q8hr x 72 hours, Mucinex '600mg'$  bid x 3 days, Furosemide '10mg'$ /Kcl 65mq qd x 3 days. Observe.

## 2021-10-29 NOTE — Assessment & Plan Note (Signed)
T5, T8, L3 compression fxs. Takes, Tylenol, Gabapentin for pain, off Tramadol. DDD per CT head/cervical spine 08/02/20

## 2021-10-29 NOTE — Assessment & Plan Note (Signed)
Allergic to statin. On ASA.  HPOA desired not hospital evaluation. Hgb 12.8 10/01/21

## 2021-10-29 NOTE — Assessment & Plan Note (Signed)
07/15/19 CT head Mild generalized parenchymal atrophy and chronic small vessel ischemic disease, tolerated Memantine well.     

## 2021-10-29 NOTE — Assessment & Plan Note (Signed)
BLE

## 2021-10-29 NOTE — Progress Notes (Signed)
Location:  [place] Room Number: NO/31/A Place of Service:  SNF (31) Provider:  Phoenyx Melka X, NP Virgie Dad, MD  Patient Care Team: Virgie Dad, MD as PCP - General (Internal Medicine) Kenichi Cassada X, NP as Nurse Practitioner (Internal Medicine) Virgie Dad, MD as Consulting Physician (Internal Medicine) Virgie Dad, MD (Internal Medicine)  Extended Emergency Contact Information Primary Emergency Contact: Jeannine Kitten of [place] Phone: [phone] Relation: Son Secondary Emergency Contact: Hensler,[name] Mobile Phone: [phone] Relation: Son  Code Status:  DNR Goals of care: Advanced Directive information    10/11/2021    9:20 AM  Advanced Directives  Does Patient Have a Medical Advance Directive? Yes  Type of Paramedic of [place];Out of facility DNR (pink MOST or yellow form)  Does patient want to make changes to medical advance directive? No - Patient declined  Copy of [place] in Chart? Yes - validated most recent copy scanned in chart (See row information)  Pre-existing out of facility DNR order (yellow form or pink MOST form) Pink MOST form placed in chart (order not valid for inpatient use)     Chief Complaint  Patient presents with   Acute Visit    Patient is being seen for coughing and weezing    HPI:  Pt is a 88 y.o. female seen today for an acute visit for wheezing cough, congestive cough, afebrile, no O2 desaturation, denied sore throat or chest pain. The patient was noted to have more swelling than usual. 09/30/21 CXR mild congestive heart failure.      Peripheral neuropathy, taking Gabapentin, Vit B12 Edema BLE trace Gait abnormality, uses walker to ambulate.               Hx of TIA. Allergic to statin. On ASA.  HPOA desired not hospital evaluation. Hgb 12.8 10/01/21 Seizure like activity, likely post fall, off Tramadol, not taking anti seizure meds.                T5, T8, L3 compression fxs. Takes, Tylenol, Gabapentin for pain, off Tramadol              DDD per CT head/cervical spine 08/02/20              Hx of CVA per CT head 08/02/20 Remote infarcts within the a occipital cortices bilaterally again noted. Interval development of remote infarcts within the left parietal lobe and periventricular white matte. On ASA             Vascular dementia, 07/15/19 CT head Mild generalized parenchymal atrophy and chronic small vessel ischemic disease, tolerated Memantine well.                          HTN, blood pressure is controlled on Benazepril, Amlodipine.              CKD Bun/creat 20/1.3 10/01/21             Gout stable, on Allopurinol                  Hypothyroidism, stable, on Levothyroxine. TSH 3.95 06/18/21             Abnormal CXR, suggested CT chest, not candidate for workup   Past Medical History:  Diagnosis Date   Cognitive changes    Dementia (HCC)    Depression    Gout  Hypertension    Hypothyroidism    Peripheral neuropathy    History reviewed. No pertinent surgical history.  Allergies  Allergen Reactions   Actonel [Risedronate Sodium]    Actonel [Risedronate] Other (See Comments)    "Allergic," per [facility]   Hct [Hydrochlorothiazide]    Hydrochlorothiazide Other (See Comments)    "Allergic," per [facility]   Lipitor [Atorvastatin Calcium]    Lipitor [Atorvastatin] Other (See Comments)    "Allergic," per [facility]   Neomycin Other (See Comments)    Unknown "been so long ago, a Dermatologist told me"   Neomycin Other (See Comments)    "Allergic," per [facility]   Polysporin [Bacitracin-Polymyxin B]    Polysporin [Bacitracin-Polymyxin B] Other (See Comments)    "Allergic," per [facility]   Prolia [Denosumab]    Prolia [Denosumab] Other (See Comments)   Sulfa Antibiotics    Sulfa Antibiotics Other (See Comments)    "Allergic," per [facility]   Zocor [Simvastatin]    Zocor [Simvastatin] Other (See Comments)    "Allergic," per [facility]    Outpatient Encounter  Medications as of 10/29/2021  Medication Sig   acetaminophen (TYLENOL) 325 MG tablet Take 650 mg by mouth in the morning, at noon, and at bedtime.   allopurinol (ZYLOPRIM) 100 MG tablet Take 100 mg by mouth daily.   amLODipine (NORVASC) 2.5 MG tablet Take 2.5 mg by mouth daily.   aspirin 81 MG chewable tablet Chew 81 mg by mouth daily.   benazepril (LOTENSIN) 10 MG tablet Take 10 mg by mouth in the morning.   Calcium Carb-Cholecalciferol 500-400 MG-UNIT TABS Take 2 tablets by mouth daily.    Cholecalciferol (VITAMIN D) 50 MCG (2000 UT) CAPS Take 2,000 Units by mouth daily.   Coenzyme Q10 (CO Q-10) 100 MG CAPS Take 200 mg by mouth in the morning.   gabapentin (NEURONTIN) 100 MG capsule Take 200 mg by mouth at bedtime.   levothyroxine (SYNTHROID) 100 MCG tablet Take 100 mcg by mouth once a week. On Wednesday   levothyroxine (SYNTHROID) 50 MCG tablet Take 50 mcg by mouth daily before breakfast. Sun,Mon,Tue, Thur,Fri,Sat   meclizine (ANTIVERT) 12.5 MG tablet Take 12.5 mg by mouth 2 (two) times daily as needed for dizziness.   memantine (NAMENDA) 10 MG tablet Take 10 mg by mouth 2 (two) times daily.   Omega-3 Fatty Acids (OMEGA III EPA+DHA) 1000 MG CAPS Take 2,000 mg by mouth 2 (two) times daily.   No facility-administered encounter medications on file as of 10/29/2021.   ROS was provided with assistance of staff.  Review of Systems  Constitutional:  Negative for activity change, appetite change and fever.  HENT:  Positive for hearing loss. Negative for congestion and voice change.   Eyes:  Negative for visual disturbance.  Respiratory:  Positive for cough and wheezing. Negative for shortness of breath.   Cardiovascular:  Positive for leg swelling.  Gastrointestinal:  Negative for abdominal pain and constipation.  Genitourinary:  Negative for dysuria and urgency.  Musculoskeletal:  Positive for arthralgias, back pain and gait problem.       Positional lower  portion of the mid back pain. No c/o  neck, right hip, or lower back pain.   Skin:  Negative for color change.  Neurological:  Negative for speech difficulty, weakness and light-headedness.  Psychiatric/Behavioral:  Positive for confusion. Negative for behavioral problems and sleep disturbance. The patient is not nervous/anxious.     Immunization History  Administered Date(s) Administered   Influenza, High Dose Seasonal PF 12/11/2018  Influenza-Unspecified 03/24/2018, 12/21/2019, 12/26/2020   Moderna Sars-Covid-2 Vaccination 03/12/2019, 04/09/2019, 04/21/2019, 01/17/2020, 08/07/2020   Pneumococcal Conjugate-13 09/09/2012, 10/28/2013   Pneumococcal Polysaccharide-23 04/02/2009, 04/16/2009   Tdap 07/15/2019   Zoster, Live 04/16/2009   Zoster, Unspecified 04/16/2009   Pertinent  Health Maintenance Due  Topic Date Due   DEXA SCAN  Never done   INFLUENZA VACCINE  10/08/2021      11/25/2020    8:00 PM 11/26/2020    9:00 AM 11/26/2020    8:30 PM 11/27/2020   10:00 AM 11/27/2020    8:00 PM  Fall Risk  Patient Fall Risk Level High fall risk High fall risk High fall risk High fall risk High fall risk   Functional Status Survey:    Vitals:   10/29/21 1358  BP: (!) 162/79  Pulse: 83  Resp: 15  Temp: (!) 97.4 F (36.3 C)  SpO2: 96%  Weight: 166 lb (75.3 kg)  Height: '5\' 4"'$  (1.626 m)   Body mass index is 28.49 kg/m. Physical Exam Vitals and nursing note reviewed.  Constitutional:      Appearance: Normal appearance.  HENT:     Head: Normocephalic and atraumatic.     Nose: Nose normal.     Mouth/Throat:     Mouth: Mucous membranes are moist.  Eyes:     Extraocular Movements: Extraocular movements intact.     Conjunctiva/sclera: Conjunctivae normal.     Pupils: Pupils are equal, round, and reactive to light.  Cardiovascular:     Rate and Rhythm: Normal rate and regular rhythm.     Heart sounds: No murmur heard.    Comments: Weak dorsalis pedis pulses R+L Pulmonary:     Effort: Pulmonary effort is normal.      Breath sounds: Wheezing, rhonchi and rales present.     Comments: Diffused R+L lungs.  Abdominal:     General: Bowel sounds are normal.     Palpations: Abdomen is soft.     Tenderness: There is no abdominal tenderness.  Musculoskeletal:     Cervical back: Normal range of motion and neck supple.     Right lower leg: Edema present.     Left lower leg: Edema present.     Comments: Trace edema BLE  Skin:    General: Skin is warm and dry.  Neurological:     General: No focal deficit present.     Mental Status: She is alert. Mental status is at baseline.     Motor: No weakness.     Coordination: Coordination normal.     Gait: Gait abnormal.  Psychiatric:        Mood and Affect: Mood normal.        Behavior: Behavior normal.     Labs reviewed: Recent Labs    11/24/20 1322 11/24/20 1715 11/25/20 0955 11/26/20 0602 02/20/21 0000 06/18/21 0000 10/01/21 0000  NA 141  --  141 140 143 142 141  K 3.0*  --  3.2* 3.8 3.8 3.7 4.0  CL 103  --  106 104 108 105 104  CO2 25  --  24 21*  --  25* 27*  GLUCOSE 107*  --  81 98  --   --   --   BUN 12  --  9 5* 27* 26* 20  CREATININE 1.37*  --  0.94 0.85 1.2* 1.2* 1.3*  CALCIUM 9.1  --  9.0 9.2 26.0* 9.4 9.3  MG  --  1.8  --   --   --   --   --  Recent Labs    11/24/20 1322 02/20/21 0000 06/18/21 0000 10/01/21 0000  AST 22 12* 15 13  ALT '11 8 12 9  '$ ALKPHOS 87 79 88 80  BILITOT 0.7  --   --   --   PROT 6.3*  --   --   --   ALBUMIN 3.4* 3.5 3.8 3.9   Recent Labs    11/24/20 1322 02/20/21 0000 06/18/21 0000 10/01/21 0000  WBC 12.7* 7.2 7.9 7.4  NEUTROABS 10.4* 4,918.00 5,925.00 5,469.00  HGB 13.8 11.8* 12.5 12.8  HCT 42.3 35* 37 38  MCV 101.7*  --   --   --   PLT 249 208 226  --    Lab Results  Component Value Date   TSH 2.34 06/22/2020   No results found for: "HGBA1C" No results found for: "CHOL", "HDL", "LDLCALC", "LDLDIRECT", "TRIG", "CHOLHDL"  Significant Diagnostic Results in last 30 days:  No results  found.  Assessment/Plan Cough wheezing cough, congestive cough, afebrile, no O2 desaturation, denied sore throat or chest pain. The patient was noted to have more swelling than usual. 09/30/21 CXR mild congestive heart failure.    Will obtain CXR ap/lateral, DuoNeb q8hr x 72 hours, Mucinex '600mg'$  bid x 3 days, Furosemide '10mg'$ /Kcl 25mq qd x 3 days. Observe.   Edema, peripheral BLE  Peripheral neuropathy  taking Gabapentin, Vit B12  Gait instability uses walker to ambulate.      TIA (transient ischemic attack) Allergic to statin. On ASA.  HPOA desired not hospital evaluation. Hgb 12.8 10/01/21  Seizure-like activity (HCC) Seizure like activity, likely post fall, off Tramadol, not taking anti seizure meds.   Generalized osteoarthritis of multiple sites T5, T8, L3 compression fxs. Takes, Tylenol, Gabapentin for pain, off Tramadol. DDD per CT head/cervical spine 08/02/20   History of CVA (cerebrovascular accident) Hx of CVA per CT head 08/02/20 Remote infarcts within the a occipital cortices bilaterally again noted. Interval development of remote infarcts within the left parietal lobe and periventricular white matte. On ASA  [place] 07[place]19 [place] head Mild generalized parenchymal atrophy and chronic small vessel ischemic disease, tolerated Memantine well.         HTN (hypertension) blood pressure is controlled on Benazepril, Amlodipine.   CKD (chronic kidney disease) stage 3, GFR 30-59 ml/min (HCC) Bun/creat 20/1.3 10/01/21  Gout  stable, on Allopurinol       Hypothyroidism stable, on Levothyroxine. TSH 3.95 06/18/21     Family/ staff Communication: plan of care reviewed with the patient and charge nurse.   Labs/tests ordered: CXR ap/lateral  Time spend 35 minutes.

## 2021-10-29 NOTE — Assessment & Plan Note (Signed)
Seizure like activity, likely post fall, off Tramadol, not taking anti seizure meds.  

## 2021-10-29 NOTE — Assessment & Plan Note (Signed)
Hx of CVA per CT head 08/02/20 Remote infarcts within the a occipital cortices bilaterally again noted. Interval development of remote infarcts within the left parietal lobe and periventricular white matte. On ASA 

## 2021-11-04 ENCOUNTER — Non-Acute Institutional Stay (SKILLED_NURSING_FACILITY): Payer: Medicare Other | Admitting: Nurse Practitioner

## 2021-11-04 ENCOUNTER — Encounter: Payer: Self-pay | Admitting: Nurse Practitioner

## 2021-11-04 DIAGNOSIS — F01B0 Vascular dementia, moderate, without behavioral disturbance, psychotic disturbance, mood disturbance, and anxiety: Secondary | ICD-10-CM | POA: Diagnosis not present

## 2021-11-04 DIAGNOSIS — M549 Dorsalgia, unspecified: Secondary | ICD-10-CM | POA: Diagnosis not present

## 2021-11-04 DIAGNOSIS — R609 Edema, unspecified: Secondary | ICD-10-CM

## 2021-11-04 DIAGNOSIS — M109 Gout, unspecified: Secondary | ICD-10-CM

## 2021-11-04 DIAGNOSIS — N1831 Chronic kidney disease, stage 3a: Secondary | ICD-10-CM

## 2021-11-04 DIAGNOSIS — I10 Essential (primary) hypertension: Secondary | ICD-10-CM | POA: Diagnosis not present

## 2021-11-04 DIAGNOSIS — G459 Transient cerebral ischemic attack, unspecified: Secondary | ICD-10-CM

## 2021-11-04 DIAGNOSIS — I509 Heart failure, unspecified: Secondary | ICD-10-CM

## 2021-11-04 DIAGNOSIS — E039 Hypothyroidism, unspecified: Secondary | ICD-10-CM

## 2021-11-04 DIAGNOSIS — R569 Unspecified convulsions: Secondary | ICD-10-CM | POA: Diagnosis not present

## 2021-11-04 DIAGNOSIS — R051 Acute cough: Secondary | ICD-10-CM | POA: Diagnosis not present

## 2021-11-04 DIAGNOSIS — R2681 Unsteadiness on feet: Secondary | ICD-10-CM

## 2021-11-04 DIAGNOSIS — G609 Hereditary and idiopathic neuropathy, unspecified: Secondary | ICD-10-CM

## 2021-11-04 DIAGNOSIS — Z8673 Personal history of transient ischemic attack (TIA), and cerebral infarction without residual deficits: Secondary | ICD-10-CM

## 2021-11-04 DIAGNOSIS — I517 Cardiomegaly: Secondary | ICD-10-CM | POA: Diagnosis not present

## 2021-11-04 DIAGNOSIS — J810 Acute pulmonary edema: Secondary | ICD-10-CM | POA: Diagnosis not present

## 2021-11-04 NOTE — Progress Notes (Unsigned)
Location:  [place] Room Number: NO/31/A Place of Service:  SNF (31) Provider:  Rachal Dvorsky X, NP Virgie Dad, MD  Patient Care Team: Virgie Dad, MD as PCP - General (Internal Medicine) Meliss Fleek X, NP as Nurse Practitioner (Internal Medicine) Virgie Dad, MD as Consulting Physician (Internal Medicine) Virgie Dad, MD (Internal Medicine)  Extended Emergency Contact Information Primary Emergency Contact: Jeannine Kitten of [place] Phone: [phone] Relation: Son Secondary Emergency Contact: Aung,[name] Mobile Phone: [phone] Relation: Son  Code Status:  DNR Goals of care: Advanced Directive information    10/11/2021    9:20 AM  Advanced Directives  Does Patient Have a Medical Advance Directive? Yes  Type of Paramedic of [place];Out of facility DNR (pink MOST or yellow form)  Does patient want to make changes to medical advance directive? No - Patient declined  Copy of [place] in Chart? Yes - validated most recent copy scanned in chart (See row information)  Pre-existing out of facility DNR order (yellow form or pink MOST form) Pink MOST form placed in chart (order not valid for inpatient use)     Chief Complaint  Patient presents with   Acute Visit    Patient is being seen for persistent cough and wheezing     HPI:  Pt is a 88 y.o. female seen today for an acute visit for persisted wheezing, cough, afebrile, no O2 desaturation.   Congestive wheezing cough. The patient was noted to have more swelling than usual. 09/30/21 CXR mild congestive heart failure. 10/29/21 treated with Furosemide/ DuoNeb/Mucinex x 3 days, 10/29/21 CXR no infiltrate, effusion, or acute findings.              Peripheral neuropathy, taking Gabapentin, Vit B12 Edema BLE trace Gait abnormality, uses walker to ambulate.               Hx of TIA. Allergic to statin. On ASA.  HPOA desired not hospital  evaluation. Hgb 12.8 10/01/21 Seizure like activity, likely post fall, off Tramadol, not taking anti seizure meds.               T5, T8, L3 compression fxs. Takes, Tylenol, Gabapentin for pain, off Tramadol              DDD per CT head/cervical spine 08/02/20              Hx of CVA per CT head 08/02/20 Remote infarcts within the a occipital cortices bilaterally again noted. Interval development of remote infarcts within the left parietal lobe and periventricular white matte. On ASA             Vascular dementia, 07/15/19 CT head Mild generalized parenchymal atrophy and chronic small vessel ischemic disease, tolerated Memantine well.                          HTN, blood pressure is controlled on Benazepril, Amlodipine.              CKD Bun/creat 20/1.3 10/01/21             Gout stable, on Allopurinol                  Hypothyroidism, stable, on Levothyroxine. TSH 3.95 06/18/21             Abnormal CXR, suggested CT chest, not candidate for workup  Past Medical History:  Diagnosis Date   Cognitive changes    Dementia ([place])    Depression    Gout    Hypertension    Hypothyroidism    Peripheral neuropathy    History reviewed. No pertinent surgical history.  Allergies  Allergen Reactions   Actonel [Risedronate Sodium]    Actonel [Risedronate] Other (See Comments)    "Allergic," per [facility]   Hct [Hydrochlorothiazide]    Hydrochlorothiazide Other (See Comments)    "Allergic," per [facility]   Lipitor [Atorvastatin Calcium]    Lipitor [Atorvastatin] Other (See Comments)    "Allergic," per [facility]   Neomycin Other (See Comments)    Unknown "been so long ago, a Dermatologist told me"   Neomycin Other (See Comments)    "Allergic," per [facility]   Polysporin [Bacitracin-Polymyxin B]    Polysporin [Bacitracin-Polymyxin B] Other (See Comments)    "Allergic," per [facility]   Prolia [Denosumab]    Prolia [Denosumab] Other (See Comments)   Sulfa Antibiotics    Sulfa Antibiotics Other (See Comments)    "Allergic," per  MAR   Zocor [Simvastatin]    Zocor [Simvastatin] Other (See Comments)    "Allergic," per [facility]    Outpatient Encounter Medications as of 11/04/2021  Medication Sig   acetaminophen (TYLENOL) 325 MG tablet Take 650 mg by mouth in the morning, at noon, and at bedtime.   allopurinol (ZYLOPRIM) 100 MG tablet Take 100 mg by mouth daily.   amLODipine (NORVASC) 2.5 MG tablet Take 2.5 mg by mouth daily.   aspirin 81 MG chewable tablet Chew 81 mg by mouth daily.   benazepril (LOTENSIN) 10 MG tablet Take 10 mg by mouth in the morning.   Calcium Carb-Cholecalciferol 500-400 MG-UNIT TABS Take 2 tablets by mouth daily.    Cholecalciferol (VITAMIN D) 50 MCG (2000 UT) CAPS Take 2,000 Units by mouth daily.   Coenzyme Q10 (CO Q-10) 100 MG CAPS Take 200 mg by mouth in the morning.   gabapentin (NEURONTIN) 100 MG capsule Take 200 mg by mouth at bedtime.   levothyroxine (SYNTHROID) 100 MCG tablet Take 100 mcg by mouth once a week. On Wednesday   levothyroxine (SYNTHROID) 50 MCG tablet Take 50 mcg by mouth daily before breakfast. Sun,Mon,Tue, Thur,Fri,Sat   meclizine (ANTIVERT) 12.5 MG tablet Take 12.5 mg by mouth 2 (two) times daily as needed for dizziness.   memantine (NAMENDA) 10 MG tablet Take 10 mg by mouth 2 (two) times daily.   Omega-3 Fatty Acids (OMEGA III EPA+DHA) 1000 MG CAPS Take 2,000 mg by mouth 2 (two) times daily.   No facility-administered encounter medications on file as of 11/04/2021.    Review of Systems  Constitutional:  Positive for fatigue. Negative for appetite change and fever.  HENT:  Positive for hearing loss. Negative for congestion and voice change.   Eyes:  Negative for visual disturbance.  Respiratory:  Positive for cough and wheezing. Negative for shortness of breath.   Cardiovascular:  Positive for leg swelling.  Gastrointestinal:  Negative for abdominal pain and constipation.  Genitourinary:  Negative for dysuria and urgency.  Musculoskeletal:  Positive for arthralgias, back  pain and gait problem.       Positional lower  portion of the mid back pain. No c/o neck, right hip, or lower back pain.   Skin:  Negative for color change.  Neurological:  Negative for speech difficulty, weakness and light-headedness.  Psychiatric/Behavioral:  Positive for confusion. Negative for behavioral problems and sleep disturbance. The patient is not  nervous/anxious.     Immunization History  Administered Date(s) Administered   Influenza, High Dose Seasonal PF 12/11/2018   Influenza-Unspecified 03/24/2018, 12/21/2019, 12/26/2020   Moderna Sars-Covid-2 Vaccination 03/12/2019, 04/09/2019, 04/21/2019, 01/17/2020, 08/07/2020   Pneumococcal Conjugate-13 09/09/2012, 10/28/2013   Pneumococcal Polysaccharide-23 04/02/2009, 04/16/2009   Tdap 07/15/2019   Zoster, Live 04/16/2009   Zoster, Unspecified 04/16/2009   Pertinent  Health Maintenance Due  Topic Date Due   DEXA SCAN  Never done   INFLUENZA VACCINE  10/08/2021      11/25/2020    8:00 PM 11/26/2020    9:00 AM 11/26/2020    8:30 PM 11/27/2020   10:00 AM 11/27/2020    8:00 PM  Fall Risk  Patient Fall Risk Level High fall risk High fall risk High fall risk High fall risk High fall risk   Functional Status Survey:    Vitals:   11/04/21 1300  BP: 128/73  Pulse: 91  Resp: 19  Temp: (!) 96.7 F (35.9 C)  SpO2: 93%  Weight: 166 lb (75.3 kg)  Height: '5\' 4"'  (1.626 m)   Body mass index is 28.49 kg/m. Physical Exam Vitals and nursing note reviewed.  Constitutional:      Appearance: Normal appearance.  HENT:     Head: Normocephalic and atraumatic.     Nose: Nose normal.     Mouth/Throat:     Mouth: Mucous membranes are moist.  Eyes:     Extraocular Movements: Extraocular movements intact.     Conjunctiva/sclera: Conjunctivae normal.     Pupils: Pupils are equal, round, and reactive to light.  Cardiovascular:     Rate and Rhythm: Normal rate and regular rhythm.     Heart sounds: No murmur heard.    Comments: Weak  dorsalis pedis pulses R+L Pulmonary:     Effort: Pulmonary effort is normal.     Breath sounds: Wheezing, rhonchi and rales present.     Comments: Diffused R+L lungs.  Abdominal:     General: Bowel sounds are normal.     Palpations: Abdomen is soft.     Tenderness: There is no abdominal tenderness.  Musculoskeletal:     Cervical back: Normal range of motion and neck supple.     Right lower leg: Edema present.     Left lower leg: Edema present.     Comments: Trace edema BLE  Skin:    General: Skin is warm and dry.  Neurological:     General: No focal deficit present.     Mental Status: She is alert. Mental status is at baseline.     Motor: No weakness.     Coordination: Coordination normal.     Gait: Gait abnormal.  Psychiatric:        Mood and Affect: Mood normal.        Behavior: Behavior normal.     Labs reviewed: Recent Labs    11/24/20 1322 11/24/20 1715 11/25/20 0955 11/26/20 0602 02/20/21 0000 06/18/21 0000 10/01/21 0000  NA 141  --  141 140 143 142 141  K 3.0*  --  3.2* 3.8 3.8 3.7 4.0  CL 103  --  106 104 108 105 104  CO2 25  --  24 21*  --  25* 27*  GLUCOSE 107*  --  81 98  --   --   --   BUN 12  --  9 5* 27* 26* 20  CREATININE 1.37*  --  0.94 0.85 1.2* 1.2* 1.3*  CALCIUM 9.1  --  9.0 9.2 26.0* 9.4 9.3  MG  --  1.8  --   --   --   --   --    Recent Labs    11/24/20 1322 02/20/21 0000 06/18/21 0000 10/01/21 0000  AST 22 12* 15 13  ALT '11 8 12 9  ' ALKPHOS 87 79 88 80  BILITOT 0.7  --   --   --   PROT 6.3*  --   --   --   ALBUMIN 3.4* 3.5 3.8 3.9   Recent Labs    11/24/20 1322 02/20/21 0000 06/18/21 0000 10/01/21 0000  WBC 12.7* 7.2 7.9 7.4  NEUTROABS 10.4* 4,918.00 5,925.00 5,469.00  HGB 13.8 11.8* 12.5 12.8  HCT 42.3 35* 37 38  MCV 101.7*  --   --   --   PLT 249 208 226  --    Lab Results  Component Value Date   TSH 2.34 06/22/2020   No results found for: "HGBA1C" No results found for: "CHOL", "HDL", "LDLCALC", "LDLDIRECT", "TRIG",  "CHOLHDL"  Significant Diagnostic Results in last 30 days:  No results found.  Assessment/Plan Cough  persisted wheezing, cough, afebrile, no O2 desaturation.   Congestive wheezing cough. The patient was noted to have more swelling than usual. 09/30/21 CXR mild congestive heart failure. 10/29/21 treated with Furosemide/ DuoNeb/Mucinex x 3 days, 10/29/21 CXR no infiltrate, effusion, or acute findings.   11/04/21 will treat with Medrol dos pk, DuoNeb, repeat CXR ap/lateral, update CBC/diff, CMP/eGFR  Peripheral neuropathy taking Gabapentin, Vit B12  Edema, peripheral Edema BLE trace  Gait instability uses walker to ambulate.        TIA (transient ischemic attack)  Allergic to statin. On ASA.  HPOA desired not hospital evaluation. Hgb 12.8 10/01/21  Seizure-like activity (HCC) Seizure like activity, likely post fall, off Tramadol, not taking anti seizure meds.   Mid back pain   T5, T8, L3 compression fxs. Takes, Tylenol, Gabapentin for pain, off Tramadol.  DDD per CT head/cervical spine 08/02/20   History of CVA (cerebrovascular accident) Hx of CVA per CT head 08/02/20 Remote infarcts within the a occipital cortices bilaterally again noted. Interval development of remote infarcts within the left parietal lobe and periventricular white matte. On ASA  Vascular dementia ([place])   Vascular dementia, 07/15/19 CT head Mild generalized parenchymal atrophy and chronic small vessel ischemic disease, tolerated Memantine well.             HTN (hypertension) blood pressure is controlled on Benazepril, Amlodipine.   CKD (chronic kidney disease) stage 3, GFR 30-59 ml/min (HCC) Bun/creat 20/1.3 10/01/21  Gout Gout stable, on Allopurinol       Hypothyroidism  stable, on Levothyroxine. TSH 3.95 06/18/21     Family/ staff Communication: plan of care reviewed with the patient and charge nurse.   Labs/tests ordered: CXR ap/lateral, CBC/diff, CMP/eGFR  Time spend 35 minutes.

## 2021-11-04 NOTE — Assessment & Plan Note (Signed)
Bun/creat 20/1.3 10/01/21

## 2021-11-04 NOTE — Assessment & Plan Note (Signed)
persisted wheezing, cough, afebrile, no O2 desaturation.   Congestive wheezing cough. The patient was noted to have more swelling than usual. 09/30/21 CXR mild congestive heart failure. 10/29/21 treated with Furosemide/ DuoNeb/Mucinex x 3 days, 10/29/21 CXR no infiltrate, effusion, or acute findings.   11/04/21 will treat with Medrol dos pk, DuoNeb, repeat CXR ap/lateral, update CBC/diff, CMP/eGFR  11/04/21 CXR showed CHF, placed on Furosemide 69m with initial 4105mqd x3 days, Kcl 1070mqd, f/u BMP one week  May consider Echocardiogram if needed

## 2021-11-04 NOTE — Assessment & Plan Note (Signed)
Edema BLE trace

## 2021-11-04 NOTE — Assessment & Plan Note (Signed)
Gout stable, on Allopurinol

## 2021-11-04 NOTE — Assessment & Plan Note (Signed)
taking Gabapentin, Vit B12 

## 2021-11-04 NOTE — Assessment & Plan Note (Signed)
Seizure like activity, likely post fall, off Tramadol, not taking anti seizure meds.  

## 2021-11-04 NOTE — Assessment & Plan Note (Signed)
Hx of CVA per CT head 08/02/20 Remote infarcts within the a occipital cortices bilaterally again noted. Interval development of remote infarcts within the left parietal lobe and periventricular white matte. On ASA 

## 2021-11-04 NOTE — Assessment & Plan Note (Signed)
T5, T8, L3 compression fxs. Takes, Tylenol, Gabapentin for pain, off Tramadol.  DDD per CT head/cervical spine 08/02/20

## 2021-11-04 NOTE — Assessment & Plan Note (Signed)
Vascular dementia, 07/15/19 CT head Mild generalized parenchymal atrophy and chronic small vessel ischemic disease, tolerated Memantine well.

## 2021-11-04 NOTE — Assessment & Plan Note (Signed)
stable, on Levothyroxine. TSH 3.95 06/18/21  

## 2021-11-04 NOTE — Assessment & Plan Note (Signed)
uses walker to ambulate.

## 2021-11-04 NOTE — Assessment & Plan Note (Signed)
Allergic to statin. On ASA.  HPOA desired not hospital evaluation. Hgb 12.8 10/01/21

## 2021-11-04 NOTE — Assessment & Plan Note (Signed)
blood pressure is controlled on Benazepril, Amlodipine.  

## 2021-11-05 DIAGNOSIS — I10 Essential (primary) hypertension: Secondary | ICD-10-CM | POA: Diagnosis not present

## 2021-11-05 LAB — BASIC METABOLIC PANEL
BUN: 25 — AB (ref 4–21)
CO2: 27 — AB (ref 13–22)
Chloride: 105 (ref 99–108)
Creatinine: 1.2 — AB (ref 0.5–1.1)
Glucose: 78
Potassium: 4.1 mEq/L (ref 3.5–5.1)
Sodium: 142 (ref 137–147)

## 2021-11-05 LAB — HEPATIC FUNCTION PANEL
ALT: 9 U/L (ref 7–35)
AST: 15 (ref 13–35)
Alkaline Phosphatase: 89 (ref 25–125)
Bilirubin, Total: 0.4

## 2021-11-05 LAB — CBC AND DIFFERENTIAL
HCT: 37 (ref 36–46)
Hemoglobin: 12.6 (ref 12.0–16.0)
Platelets: 231 10*3/uL (ref 150–400)
WBC: 8.8

## 2021-11-05 LAB — COMPREHENSIVE METABOLIC PANEL
Albumin: 3.9 (ref 3.5–5.0)
Calcium: 9.3 (ref 8.7–10.7)
Globulin: 2.9
eGFR: 42

## 2021-11-05 LAB — CBC: RBC: 3.87 (ref 3.87–5.11)

## 2021-11-05 MED ORDER — POTASSIUM CHLORIDE CRYS ER 10 MEQ PO TBCR: 10.0000 meq | EXTENDED_RELEASE_TABLET | Freq: Two times a day (BID) | ORAL | Status: DC

## 2021-11-05 MED ORDER — FUROSEMIDE 20 MG PO TABS: 20.0000 mg | ORAL_TABLET | Freq: Every day | ORAL | 3 refills | Status: DC

## 2021-11-06 DIAGNOSIS — R1312 Dysphagia, oropharyngeal phase: Secondary | ICD-10-CM | POA: Diagnosis not present

## 2021-11-11 DIAGNOSIS — R1312 Dysphagia, oropharyngeal phase: Secondary | ICD-10-CM | POA: Diagnosis not present

## 2021-11-12 DIAGNOSIS — R1312 Dysphagia, oropharyngeal phase: Secondary | ICD-10-CM | POA: Diagnosis not present

## 2021-11-12 DIAGNOSIS — I10 Essential (primary) hypertension: Secondary | ICD-10-CM | POA: Diagnosis not present

## 2021-11-12 LAB — COMPREHENSIVE METABOLIC PANEL
Calcium: 9.2 (ref 8.7–10.7)
eGFR: 29

## 2021-11-12 LAB — BASIC METABOLIC PANEL
BUN: 57 — AB (ref 4–21)
CO2: 27 — AB (ref 13–22)
Chloride: 102 (ref 99–108)
Creatinine: 1.6 — AB (ref 0.5–1.1)
Glucose: 75
Potassium: 4.5 mEq/L (ref 3.5–5.1)
Sodium: 139 (ref 137–147)

## 2021-11-13 DIAGNOSIS — R1312 Dysphagia, oropharyngeal phase: Secondary | ICD-10-CM | POA: Diagnosis not present

## 2021-11-14 DIAGNOSIS — R1312 Dysphagia, oropharyngeal phase: Secondary | ICD-10-CM | POA: Diagnosis not present

## 2021-11-15 DIAGNOSIS — R1312 Dysphagia, oropharyngeal phase: Secondary | ICD-10-CM | POA: Diagnosis not present

## 2021-11-19 ENCOUNTER — Encounter: Payer: Self-pay | Admitting: Family Medicine

## 2021-11-19 ENCOUNTER — Non-Acute Institutional Stay (SKILLED_NURSING_FACILITY): Payer: Medicare Other | Admitting: Family Medicine

## 2021-11-19 DIAGNOSIS — I10 Essential (primary) hypertension: Secondary | ICD-10-CM | POA: Diagnosis not present

## 2021-11-19 DIAGNOSIS — I509 Heart failure, unspecified: Secondary | ICD-10-CM

## 2021-11-19 DIAGNOSIS — N1831 Chronic kidney disease, stage 3a: Secondary | ICD-10-CM | POA: Diagnosis not present

## 2021-11-19 DIAGNOSIS — R2681 Unsteadiness on feet: Secondary | ICD-10-CM

## 2021-11-19 DIAGNOSIS — Z8673 Personal history of transient ischemic attack (TIA), and cerebral infarction without residual deficits: Secondary | ICD-10-CM

## 2021-11-19 DIAGNOSIS — F03918 Unspecified dementia, unspecified severity, with other behavioral disturbance: Secondary | ICD-10-CM

## 2021-11-19 DIAGNOSIS — F01B0 Vascular dementia, moderate, without behavioral disturbance, psychotic disturbance, mood disturbance, and anxiety: Secondary | ICD-10-CM | POA: Diagnosis not present

## 2021-11-19 DIAGNOSIS — R1312 Dysphagia, oropharyngeal phase: Secondary | ICD-10-CM | POA: Diagnosis not present

## 2021-11-19 NOTE — Progress Notes (Signed)
Location:  [facility] Number: 31-A Place of Service:  SNF [phone]) Provider:  Lillette Boxer. Rinaldo Cloud, MD  Patient Care Team: Virgie Dad, MD as PCP - General (Internal Medicine) Mast, Man X, NP as Nurse Practitioner (Internal Medicine) Virgie Dad, MD as Consulting Physician (Internal Medicine) Virgie Dad, MD (Internal Medicine)  Extended Emergency Contact Information Primary Emergency Contact: Jeannine Kitten of [place] Phone: [phone] Relation: Son Secondary Emergency Contact: Cayer,[name] Mobile Phone: [phone] Relation: Son  Code Status:  DNR Goals of care: Advanced Directive information    11/19/2021   10:24 AM  Advanced Directives  Does Patient Have a Medical Advance Directive? Yes  Type of Paramedic of [place];Out of facility DNR (pink MOST or yellow form)  Does patient want to make changes to medical advance directive? No - Patient declined  Copy of [place] in Chart? Yes - validated most recent copy scanned in chart (See row information)  Pre-existing out of facility DNR order (yellow form or pink MOST form) Pink MOST form placed in chart (order not valid for inpatient use)     Chief Complaint  Patient presents with   Routine    HPI:  Pt is a 88 y.o. female seen today for medical management of chronic diseases, including vascular dementia with abnormal CT scanning 2021 consistent with chronic small vessel ischemic disease and old CVA with infarcts within the occipital lobes bilaterally, peripheral neuropathy on gabapentin gait abnormality, degenerative joint disease, hypertension, chronic kidney disease, gout, hypothyroidism,. She was seen last week for cough and wheezing and increased edema.  Chest x-[place] showed mild congestive heart failure.  She was treated with Lasix DuoNeb and Mucinex for 3 days.  Later given Medrol dose pack.  Repeat chest  x-rays failed to show resolution and she was given Lasix increased dose to 40 mg for 3 days.  She does report to feeling improved now.   Past Medical History:  Diagnosis Date   Cognitive changes    Dementia ([place])    Depression    Gout    Hypertension    Hypothyroidism    Peripheral neuropathy    History reviewed. No pertinent surgical history.  Allergies  Allergen Reactions   Actonel [Risedronate Sodium]    Actonel [Risedronate] Other (See Comments)    "Allergic," per [facility]   Hct [Hydrochlorothiazide]    Hydrochlorothiazide Other (See Comments)    "Allergic," per [facility]   Lipitor [Atorvastatin [place]]    Lipitor [Atorvastatin] Other (See Comments)    "Allergic," per [facility]   Neomycin Other (See Comments)    Unknown "been so long ago, a Dermatologist told me"   Neomycin Other (See Comments)    "Allergic," per [facility]   Polysporin [Bacitracin-Polymyxin B]    Polysporin [Bacitracin-Polymyxin B] Other (See Comments)    "Allergic," per [facility]   Prolia [Denosumab]    Prolia [Denosumab] Other (See Comments)   Sulfa Antibiotics    Sulfa Antibiotics Other (See Comments)    "Allergic," per [facility]   Zocor [Simvastatin]    Zocor [Simvastatin] Other (See Comments)    "Allergic," per [facility]    Outpatient Encounter Medications as of 11/19/2021  Medication Sig   acetaminophen (TYLENOL) 325 MG tablet Take 650 mg by mouth in the morning, at noon, and at bedtime.   allopurinol (ZYLOPRIM) 100 MG tablet Take 100 mg by mouth daily.   amLODipine (NORVASC) 2.5 MG tablet Take 2.5  mg by mouth daily.   aspirin 81 MG chewable tablet Chew 81 mg by mouth daily.   benazepril (LOTENSIN) 10 MG tablet Take 10 mg by mouth in the morning.   Calcium Carb-Cholecalciferol 500-400 MG-UNIT TABS Take 2 tablets by mouth daily.    Cholecalciferol (VITAMIN D) 50 MCG (2000 UT) CAPS Take 2,000 Units by mouth daily.   Coenzyme Q10 (CO Q-10) 100 MG CAPS Take 200 mg by mouth in the morning.   furosemide (LASIX) 20 MG tablet Take 1  tablet (20 mg total) by mouth daily.   gabapentin (NEURONTIN) 100 MG capsule Take 200 mg by mouth at bedtime.   levothyroxine (SYNTHROID) 100 MCG tablet Take 100 mcg by mouth once a week. On Wednesday   levothyroxine (SYNTHROID) 50 MCG tablet Take 50 mcg by mouth daily before breakfast. Sun,Mon,Tue, Thur,Fri,Sat   meclizine (ANTIVERT) 12.5 MG tablet Take 12.5 mg by mouth 2 (two) times daily as needed for dizziness.   memantine (NAMENDA) 10 MG tablet Take 10 mg by mouth 2 (two) times daily.   Omega-3 Fatty Acids (OMEGA III EPA+DHA) 1000 MG CAPS Take 2,000 mg by mouth 2 (two) times daily.   potassium chloride (KLOR-CON M) 10 MEQ tablet Take 1 tablet (10 mEq total) by mouth 2 (two) times daily.   No facility-administered encounter medications on file as of 11/19/2021.    Review of Systems  Constitutional:  Negative for fever.  HENT:  Positive for hearing loss.   Eyes: Negative.   Respiratory:  Positive for cough.   Cardiovascular:  Positive for leg swelling.  Gastrointestinal: Negative.   Neurological:  Negative for seizures.  Psychiatric/Behavioral:  Positive for confusion.   All other systems reviewed and are negative.   Immunization History  Administered Date(s) Administered   Influenza, High Dose Seasonal PF 12/11/2018   Influenza-Unspecified 03/24/2018, 12/21/2019, 12/26/2020   Moderna SARS-COV2 Booster Vaccination 02/20/2021, 07/26/2021   Moderna Sars-Covid-2 Vaccination 03/12/2019, 04/09/2019, 04/21/2019, 01/17/2020, 08/07/2020   Pneumococcal Conjugate-13 09/09/2012, 10/28/2013   Pneumococcal Polysaccharide-23 04/02/2009, 04/16/2009   Tdap 07/15/2019   Zoster, Live 04/16/2009   Zoster, Unspecified 04/16/2009   Pertinent  Health Maintenance Due  Topic Date Due   DEXA SCAN  Never done   INFLUENZA VACCINE  10/08/2021      11/25/2020    8:00 PM 11/26/2020    9:00 AM 11/26/2020    8:30 PM 11/27/2020   10:00 AM 11/27/2020    8:00 PM  Fall Risk  Patient Fall Risk Level High  fall risk High fall risk High fall risk High fall risk High fall risk   Functional Status Survey:    Vitals:   11/19/21 1020  BP: (!) 117/59  Pulse: 80  Resp: 19  Temp: (!) 97.4 F (36.3 C)  SpO2: 98%  Weight: 162 lb 12.8 oz (73.8 kg)  Height: '5\' 4"'$  (1.626 m)   Body mass index is 27.94 kg/m. Physical Exam Vitals and nursing note reviewed.  Constitutional:      Appearance: Normal appearance.  Cardiovascular:     Rate and Rhythm: Normal rate and regular rhythm.  Pulmonary:     Effort: Pulmonary effort is normal.     Breath sounds: Normal breath sounds.  Abdominal:     General: Abdomen is flat. Bowel sounds are normal.     Tenderness: There is abdominal tenderness. There is no guarding.  Musculoskeletal:     Comments: Uses walker for ambulation  Neurological:     General: No focal deficit present.  Mental Status: She is alert. Mental status is at baseline.     Comments: Oriented to person but not place or time Patient able to turn TV back on after I turned it off to talk with her during my visit  Psychiatric:        Mood and Affect: Mood normal.        Behavior: Behavior normal.     Labs reviewed: Recent Labs    11/24/20 1322 11/24/20 1715 11/25/20 0955 11/26/20 0602 02/20/21 0000 10/01/21 0000 11/05/21 0000 11/12/21 0000  NA 141  --  141 140   < > 141 142 139  K 3.0*  --  3.2* 3.8   < > 4.0 4.1 4.5  CL 103  --  106 104   < > 104 105 102  CO2 25  --  24 21*   < > 27* 27* 27*  GLUCOSE 107*  --  81 98  --   --   --   --   BUN 12  --  9 5*   < > 20 25* 57*  CREATININE 1.37*  --  0.94 0.85   < > 1.3* 1.2* 1.6*  CALCIUM 9.1  --  9.0 9.2   < > 9.3 9.3 9.2  MG  --  1.8  --   --   --   --   --   --    < > = values in this interval not displayed.   Recent Labs    11/24/20 1322 02/20/21 0000 06/18/21 0000 10/01/21 0000 11/05/21 0000  AST 22   < > '15 13 15  '$ ALT 11   < > '12 9 9  '$ ALKPHOS 87   < > 88 80 89  BILITOT 0.7  --   --   --   --   PROT 6.3*  --    --   --   --   ALBUMIN 3.4*   < > 3.8 3.9 3.9   < > = values in this interval not displayed.   Recent Labs    11/24/20 1322 11/24/20 1322 02/20/21 0000 06/18/21 0000 10/01/21 0000 11/05/21 0000  WBC 12.7*   < > 7.2 7.9 7.4 8.8  NEUTROABS 10.4*  --  4,918.00 5,925.00 5,469.00  --   HGB 13.8  --  11.8* 12.5 12.8 12.6  HCT 42.3  --  35* 37 38 37  MCV 101.7*  --   --   --   --   --   PLT 249  --  208 226  --  231   < > = values in this interval not displayed.   Lab Results  Component Value Date   TSH 2.34 06/22/2020   No results found for: "HGBA1C" No results found for: "CHOL", "HDL", "LDLCALC", "LDLDIRECT", "TRIG", "CHOLHDL"  Significant Diagnostic Results in last 30 days:  No results found.  Assessment/Plan 1. Stage 3a chronic kidney disease ([place]) Most recent BUN and creatinine were 5 and 0.85  2. Chronic congestive heart failure, unspecified heart failure type ([place]) After increased diuretic therapy, failure seems to be compensated  3. Dementia with behavioral disturbance (HCC) Namenda twice daily effective with behaviors  4. Gait instability Does well with a walker.  No recent falls  5. History of stroke More evidence consistent with vascular dementia  6. Primary hypertension Blood pressure good at 117/59 on amlodipine 2.5 mg and benazepril 10 mg      Family/ staff Communication:   Labs/tests  ordered:

## 2021-11-20 DIAGNOSIS — R1312 Dysphagia, oropharyngeal phase: Secondary | ICD-10-CM | POA: Diagnosis not present

## 2021-11-22 DIAGNOSIS — R1312 Dysphagia, oropharyngeal phase: Secondary | ICD-10-CM | POA: Diagnosis not present

## 2021-11-25 DIAGNOSIS — R1312 Dysphagia, oropharyngeal phase: Secondary | ICD-10-CM | POA: Diagnosis not present

## 2021-11-26 DIAGNOSIS — R1312 Dysphagia, oropharyngeal phase: Secondary | ICD-10-CM | POA: Diagnosis not present

## 2021-11-27 DIAGNOSIS — R1312 Dysphagia, oropharyngeal phase: Secondary | ICD-10-CM | POA: Diagnosis not present

## 2021-11-28 DIAGNOSIS — R1312 Dysphagia, oropharyngeal phase: Secondary | ICD-10-CM | POA: Diagnosis not present

## 2021-12-02 DIAGNOSIS — R1312 Dysphagia, oropharyngeal phase: Secondary | ICD-10-CM | POA: Diagnosis not present

## 2021-12-03 DIAGNOSIS — R1312 Dysphagia, oropharyngeal phase: Secondary | ICD-10-CM | POA: Diagnosis not present

## 2021-12-04 DIAGNOSIS — R1312 Dysphagia, oropharyngeal phase: Secondary | ICD-10-CM | POA: Diagnosis not present

## 2021-12-05 DIAGNOSIS — R1312 Dysphagia, oropharyngeal phase: Secondary | ICD-10-CM | POA: Diagnosis not present

## 2021-12-10 ENCOUNTER — Non-Acute Institutional Stay (SKILLED_NURSING_FACILITY): Payer: Medicare Other | Admitting: Nurse Practitioner

## 2021-12-10 ENCOUNTER — Encounter: Payer: Self-pay | Admitting: Nurse Practitioner

## 2021-12-10 DIAGNOSIS — F01B0 Vascular dementia, moderate, without behavioral disturbance, psychotic disturbance, mood disturbance, and anxiety: Secondary | ICD-10-CM

## 2021-12-10 DIAGNOSIS — G609 Hereditary and idiopathic neuropathy, unspecified: Secondary | ICD-10-CM

## 2021-12-10 DIAGNOSIS — I10 Essential (primary) hypertension: Secondary | ICD-10-CM | POA: Diagnosis not present

## 2021-12-10 DIAGNOSIS — E039 Hypothyroidism, unspecified: Secondary | ICD-10-CM

## 2021-12-10 DIAGNOSIS — Z8673 Personal history of transient ischemic attack (TIA), and cerebral infarction without residual deficits: Secondary | ICD-10-CM | POA: Diagnosis not present

## 2021-12-10 DIAGNOSIS — G459 Transient cerebral ischemic attack, unspecified: Secondary | ICD-10-CM

## 2021-12-10 DIAGNOSIS — I509 Heart failure, unspecified: Secondary | ICD-10-CM

## 2021-12-10 DIAGNOSIS — M159 Polyosteoarthritis, unspecified: Secondary | ICD-10-CM

## 2021-12-10 DIAGNOSIS — N1831 Chronic kidney disease, stage 3a: Secondary | ICD-10-CM | POA: Diagnosis not present

## 2021-12-10 DIAGNOSIS — R569 Unspecified convulsions: Secondary | ICD-10-CM

## 2021-12-10 DIAGNOSIS — M109 Gout, unspecified: Secondary | ICD-10-CM | POA: Diagnosis not present

## 2021-12-10 NOTE — Assessment & Plan Note (Signed)
on Allopurinol  

## 2021-12-10 NOTE — Assessment & Plan Note (Signed)
T5, T8, L3 compression fxs. Takes, Tylenol, Gabapentin for pain, off Tramadol, DDD per CT head/cervical spine 08/02/20  

## 2021-12-10 NOTE — Assessment & Plan Note (Signed)
stable, on Levothyroxine. TSH 3.95 06/18/21  

## 2021-12-10 NOTE — Assessment & Plan Note (Addendum)
09/30/21 CXR mild congestive heart failure. CXR 10/25/21 showed CHF, takes Furosemide, compensated clinically. Weight gained about #8Ibs in the past month, accurate measurement? Observe.

## 2021-12-10 NOTE — Assessment & Plan Note (Signed)
taking Gabapentin, Vit B12, ambulates with walker.

## 2021-12-10 NOTE — Assessment & Plan Note (Signed)
blood pressure is controlled on Benazepril, Amlodipine.  

## 2021-12-10 NOTE — Progress Notes (Signed)
Location:  [facility] Number: NO/31/A Place of Service:  SNF (31) Provider:  Javione Gunawan X, NP Virgie Dad, MD  Patient Care Team: Virgie Dad, MD as PCP - General (Internal Medicine) Zeah Germano X, NP as Nurse Practitioner (Internal Medicine) Virgie Dad, MD as Consulting Physician (Internal Medicine) Virgie Dad, MD (Internal Medicine)  Extended Emergency Contact Information Primary Emergency Contact: Jeannine Kitten of [place] Phone: [phone] Relation: Son Secondary Emergency Contact: Osei,[name] Mobile Phone: [phone] Relation: Son  Code Status:  DNR Goals of care: Advanced Directive information    12/10/2021   11:47 AM  Advanced Directives  Does Patient Have a Medical Advance Directive? Yes  Type of Paramedic of [place];Out of facility DNR (pink MOST or yellow form)  Does patient want to make changes to medical advance directive? No - Patient declined  Copy of [place] in Chart? Yes - validated most recent copy scanned in chart (See row information)  Pre-existing out of facility DNR order (yellow form or pink MOST form) Pink MOST form placed in chart (order not valid for inpatient use)     Chief Complaint  Patient presents with   Medical Management of Chronic Issues    Patient is here for a follow up for chronic conditions    Quality Metric Gaps    Discuss the need for shingrix vaccine, patient is also due for Dexa Scan and updated flu vaccine    HPI:  Pt is a 88 y.o. female seen today for medical management of chronic diseases.     09/30/21 CXR mild congestive heart failure. CXR 10/25/21 showed CHF, takes Furosemide, compensated clinically.              Peripheral neuropathy, taking Gabapentin, Vit B12 Gait abnormality, uses walker to ambulate.               Hx of TIA. Allergic to statin. On ASA.  HPOA desired not hospital evaluation. Hgb 12.6  11/05/21 Seizure like activity, likely post fall, off Tramadol, not taking anti seizure meds.  OA: T5, T8, L3 compression fxs. Takes, Tylenol, Gabapentin for pain, off Tramadol, DDD per CT head/cervical spine 08/02/20              Hx of CVA per CT head 08/02/20 Remote infarcts within the a occipital cortices bilaterally again noted. Interval development of remote infarcts within the left parietal lobe and periventricular white matte. On ASA             Vascular dementia, 07/15/19 CT head Mild generalized parenchymal atrophy and chronic small vessel ischemic disease, tolerated Memantine well.                          HTN, blood pressure is controlled on Benazepril, Amlodipine.              CKD Bun/creat 57/1.6 11/12/21             Gout stable, on Allopurinol                  Hypothyroidism, stable, on Levothyroxine. TSH 3.95 06/18/21             Abnormal CXR, suggested CT chest, not candidate for workup      Past Medical History:  Diagnosis Date   Cognitive changes    Dementia (HCC)    Depression    Gout  Hypertension    Hypothyroidism    Peripheral neuropathy    History reviewed. No pertinent surgical history.  Allergies  Allergen Reactions   Actonel [Risedronate Sodium]    Actonel [Risedronate] Other (See Comments)    "Allergic," per [facility]   Hct [Hydrochlorothiazide]    Hydrochlorothiazide Other (See Comments)    "Allergic," per [facility]   Lipitor [Atorvastatin Calcium]    Lipitor [Atorvastatin] Other (See Comments)    "Allergic," per [facility]   Neomycin Other (See Comments)    Unknown "been so long ago, a Dermatologist told me"   Neomycin Other (See Comments)    "Allergic," per [facility]   Polysporin [Bacitracin-Polymyxin B]    Polysporin [Bacitracin-Polymyxin B] Other (See Comments)    "Allergic," per [facility]   Prolia [Denosumab]    Prolia [Denosumab] Other (See Comments)   Sulfa Antibiotics    Sulfa Antibiotics Other (See Comments)    "Allergic," per [facility]   Zocor [Simvastatin]    Zocor  [Simvastatin] Other (See Comments)    "Allergic," per [facility]    Outpatient Encounter Medications as of 12/10/2021  Medication Sig   acetaminophen (TYLENOL) 325 MG tablet Take 650 mg by mouth in the morning, at noon, and at bedtime.   allopurinol (ZYLOPRIM) 100 MG tablet Take 100 mg by mouth daily.   amLODipine (NORVASC) 2.5 MG tablet Take 2.5 mg by mouth daily.   aspirin 81 MG chewable tablet Chew 81 mg by mouth daily.   benazepril (LOTENSIN) 10 MG tablet Take 10 mg by mouth in the morning.   Calcium Carb-Cholecalciferol 500-400 MG-UNIT TABS Take 2 tablets by mouth daily.    Cholecalciferol (VITAMIN D) 50 MCG (2000 UT) CAPS Take 2,000 Units by mouth daily.   Coenzyme Q10 (CO Q-10) 100 MG CAPS Take 200 mg by mouth in the morning.   furosemide (LASIX) 20 MG tablet Take 1 tablet (20 mg total) by mouth daily.   gabapentin (NEURONTIN) 100 MG capsule Take 200 mg by mouth at bedtime.   levothyroxine (SYNTHROID) 100 MCG tablet Take 100 mcg by mouth once a week. On Wednesday   levothyroxine (SYNTHROID) 50 MCG tablet Take 50 mcg by mouth daily before breakfast. Sun,Mon,Tue, Thur,Fri,Sat   meclizine (ANTIVERT) 12.5 MG tablet Take 12.5 mg by mouth 2 (two) times daily as needed for dizziness.   memantine (NAMENDA) 10 MG tablet Take 10 mg by mouth 2 (two) times daily.   Omega-3 Fatty Acids (OMEGA III EPA+DHA) 1000 MG CAPS Take 2,000 mg by mouth 2 (two) times daily.   potassium chloride (KLOR-CON M) 10 MEQ tablet Take 1 tablet (10 mEq total) by mouth 2 (two) times daily.   No facility-administered encounter medications on file as of 12/10/2021.    Review of Systems  Constitutional:  Positive for unexpected weight change. Negative for appetite change, fatigue and fever.       #8Ibs weight gained in one months, accurate measurement?   HENT:  Positive for hearing loss. Negative for congestion and voice change.   Eyes:  Negative for visual disturbance.  Respiratory:  Negative for cough, shortness of breath  and wheezing.   Cardiovascular:  Positive for leg swelling.  Gastrointestinal:  Negative for abdominal pain and constipation.  Genitourinary:  Negative for dysuria and urgency.  Musculoskeletal:  Positive for arthralgias, back pain and gait problem.       Positional lower  portion of the mid back pain. No c/o neck, right hip, or lower back pain.   Skin:  Negative for color change.  Neurological:  Negative for speech difficulty, weakness and light-headedness.  Psychiatric/Behavioral:  Positive for confusion. Negative for behavioral problems and sleep disturbance. The patient is not nervous/anxious.     Immunization History  Administered Date(s) Administered   Influenza, High Dose Seasonal PF 12/11/2018   Influenza-Unspecified 03/24/2018, 12/21/2019, 12/26/2020   Moderna SARS-COV2 Booster Vaccination 02/20/2021, 07/26/2021   Moderna Sars-Covid-2 Vaccination 03/12/2019, 04/09/2019, 04/21/2019, 01/17/2020, 08/07/2020   Pneumococcal Conjugate-13 09/09/2012, 10/28/2013   Pneumococcal Polysaccharide-23 04/02/2009, 04/16/2009   Tdap 07/15/2019   Zoster, Live 04/16/2009   Zoster, Unspecified 04/16/2009   Pertinent  Health Maintenance Due  Topic Date Due   DEXA SCAN  Never done   INFLUENZA VACCINE  10/08/2021      11/26/2020    9:00 AM 11/26/2020    8:30 PM 11/27/2020   10:00 AM 11/27/2020    8:00 PM 12/10/2021   11:47 AM  Fall Risk  Falls in the past year?     0  Was there an injury with Fall?     0  Fall Risk Category Calculator     0  Fall Risk Category     Low  Patient Fall Risk Level High fall risk High fall risk High fall risk High fall risk Moderate fall risk  Patient at Risk for Falls Due to     History of fall(s)  Fall risk Follow up     Falls evaluation completed   Functional Status Survey:    Vitals:   12/10/21 1148  BP: 125/60  Pulse: 74  Resp: 19  Temp: (!) 97.2 F (36.2 C)  SpO2: 96%  Weight: 170 lb (77.1 kg)  Height: '5\' 4"'$  (1.626 m)   Body mass index is 29.18  kg/m. Physical Exam Vitals and nursing note reviewed.  Constitutional:      Appearance: Normal appearance.  HENT:     Head: Normocephalic and atraumatic.     Nose: Nose normal.     Mouth/Throat:     Mouth: Mucous membranes are moist.  Eyes:     Extraocular Movements: Extraocular movements intact.     Conjunctiva/sclera: Conjunctivae normal.     Pupils: Pupils are equal, round, and reactive to light.  Cardiovascular:     Rate and Rhythm: Normal rate and regular rhythm.     Heart sounds: No murmur heard.    Comments: Weak dorsalis pedis pulses R+L Pulmonary:     Effort: Pulmonary effort is normal.     Breath sounds: Rales present. No wheezing or rhonchi.     Comments: Bibasilar rales.  Abdominal:     General: Bowel sounds are normal.     Palpations: Abdomen is soft.     Tenderness: There is no abdominal tenderness.  Musculoskeletal:     Cervical back: Normal range of motion and neck supple.     Right lower leg: Edema present.     Left lower leg: Edema present.     Comments: Trace edema BLE  Skin:    General: Skin is warm and dry.  Neurological:     General: No focal deficit present.     Mental Status: She is alert. Mental status is at baseline.     Motor: No weakness.     Coordination: Coordination normal.     Gait: Gait abnormal.  Psychiatric:        Mood and Affect: Mood normal.        Behavior: Behavior normal.     Labs reviewed: Recent Labs    10/01/21  0000 11/05/21 0000 11/12/21 0000  NA 141 142 139  K 4.0 4.1 4.5  CL 104 105 102  CO2 27* 27* 27*  BUN 20 25* 57*  CREATININE 1.3* 1.2* 1.6*  CALCIUM 9.3 9.3 9.2   Recent Labs    06/18/21 0000 10/01/21 0000 11/05/21 0000  AST '15 13 15  '$ ALT '12 9 9  '$ ALKPHOS 88 80 89  ALBUMIN 3.8 3.9 3.9   Recent Labs    02/20/21 0000 06/18/21 0000 10/01/21 0000 11/05/21 0000  WBC 7.2 7.9 7.4 8.8  NEUTROABS 4,918.00 5,925.00 5,469.00  --   HGB 11.8* 12.5 12.8 12.6  HCT 35* 37 38 37  PLT 208 226  --  231    Lab Results  Component Value Date   TSH 2.34 06/22/2020   No results found for: "HGBA1C" No results found for: "CHOL", "HDL", "LDLCALC", "LDLDIRECT", "TRIG", "CHOLHDL"  Significant Diagnostic Results in last 30 days:  No results found.  Assessment/Plan Congestive heart failure (CHF) ([place]) 09/30/21 CXR mild congestive heart failure. CXR 10/25/21 showed CHF, takes Furosemide, compensated clinically. Weight gained about #8Ibs in the past month, accurate measurement? Observe.   CKD (chronic kidney disease) stage 3, GFR 30-59 ml/min (HCC)  Bun/creat 57/1.6 11/12/21, update BMP  Peripheral neuropathy taking Gabapentin, Vit B12, ambulates with walker.   TIA (transient ischemic attack) Allergic to statin. On ASA.  HPOA desired not hospital evaluation. Hgb 12.6 11/05/21  Seizure-like activity (HCC) Seizure like activity, likely post fall, off Tramadol, not taking anti seizure meds.   Generalized osteoarthritis of multiple sites T5, T8, L3 compression fxs. Takes, Tylenol, Gabapentin for pain, off Tramadol, DDD per CT head/cervical spine 08/02/20   History of CVA (cerebrovascular accident) Hx of CVA per CT head 08/02/20 Remote infarcts within the a occipital cortices bilaterally again noted. Interval development of remote infarcts within the left parietal lobe and periventricular white matte. On ASA  Vascular dementia ([place]) 07/15/19 CT head Mild generalized parenchymal atrophy and chronic small vessel ischemic disease, tolerated Memantine well.       HTN (hypertension)  blood pressure is controlled on Benazepril, Amlodipine.   Gout  on Allopurinol       Hypothyroidism stable, on Levothyroxine. TSH 3.95 06/18/21     Family/ staff Communication: plan of care reviewed with the patient and charge nurse.   Labs/tests ordered: BMP  Time spend 35 minutes.

## 2021-12-10 NOTE — Assessment & Plan Note (Signed)
Seizure like activity, likely post fall, off Tramadol, not taking anti seizure meds.  

## 2021-12-10 NOTE — Assessment & Plan Note (Signed)
07/15/19 CT head Mild generalized parenchymal atrophy and chronic small vessel ischemic disease, tolerated Memantine well.     

## 2021-12-10 NOTE — Assessment & Plan Note (Signed)
Hx of CVA per CT head 08/02/20 Remote infarcts within the a occipital cortices bilaterally again noted. Interval development of remote infarcts within the left parietal lobe and periventricular white matte. On ASA 

## 2021-12-10 NOTE — Assessment & Plan Note (Signed)
Bun/creat 57/1.6 11/12/21, update BMP

## 2021-12-10 NOTE — Assessment & Plan Note (Signed)
Allergic to statin. On ASA.  HPOA desired not hospital evaluation. Hgb 12.6 11/05/21 

## 2021-12-12 DIAGNOSIS — I10 Essential (primary) hypertension: Secondary | ICD-10-CM | POA: Diagnosis not present

## 2021-12-12 DIAGNOSIS — E039 Hypothyroidism, unspecified: Secondary | ICD-10-CM | POA: Diagnosis not present

## 2021-12-12 LAB — BASIC METABOLIC PANEL
BUN: 29 — AB (ref 4–21)
CO2: 27 — AB (ref 13–22)
Chloride: 107 (ref 99–108)
Creatinine: 1.3 — AB (ref 0.5–1.1)
Glucose: 83
Potassium: 4.3 mEq/L (ref 3.5–5.1)
Sodium: 145 (ref 137–147)

## 2021-12-12 LAB — COMPREHENSIVE METABOLIC PANEL
Calcium: 9.3 (ref 8.7–10.7)
eGFR: 39

## 2021-12-13 ENCOUNTER — Encounter: Payer: Self-pay | Admitting: Nurse Practitioner

## 2022-01-07 ENCOUNTER — Non-Acute Institutional Stay (SKILLED_NURSING_FACILITY): Payer: Medicare Other | Admitting: Nurse Practitioner

## 2022-01-07 ENCOUNTER — Encounter: Payer: Self-pay | Admitting: Nurse Practitioner

## 2022-01-07 DIAGNOSIS — I509 Heart failure, unspecified: Secondary | ICD-10-CM | POA: Diagnosis not present

## 2022-01-07 DIAGNOSIS — E039 Hypothyroidism, unspecified: Secondary | ICD-10-CM | POA: Diagnosis not present

## 2022-01-07 DIAGNOSIS — N1831 Chronic kidney disease, stage 3a: Secondary | ICD-10-CM | POA: Diagnosis not present

## 2022-01-07 DIAGNOSIS — M159 Polyosteoarthritis, unspecified: Secondary | ICD-10-CM | POA: Diagnosis not present

## 2022-01-07 DIAGNOSIS — R9389 Abnormal findings on diagnostic imaging of other specified body structures: Secondary | ICD-10-CM | POA: Diagnosis not present

## 2022-01-07 DIAGNOSIS — R569 Unspecified convulsions: Secondary | ICD-10-CM

## 2022-01-07 DIAGNOSIS — F01B0 Vascular dementia, moderate, without behavioral disturbance, psychotic disturbance, mood disturbance, and anxiety: Secondary | ICD-10-CM | POA: Diagnosis not present

## 2022-01-07 DIAGNOSIS — I10 Essential (primary) hypertension: Secondary | ICD-10-CM

## 2022-01-07 DIAGNOSIS — Z8673 Personal history of transient ischemic attack (TIA), and cerebral infarction without residual deficits: Secondary | ICD-10-CM | POA: Diagnosis not present

## 2022-01-07 DIAGNOSIS — G609 Hereditary and idiopathic neuropathy, unspecified: Secondary | ICD-10-CM

## 2022-01-07 DIAGNOSIS — M109 Gout, unspecified: Secondary | ICD-10-CM

## 2022-01-07 DIAGNOSIS — G459 Transient cerebral ischemic attack, unspecified: Secondary | ICD-10-CM

## 2022-01-07 NOTE — Assessment & Plan Note (Signed)
Hx of TIA. Allergic to statin. On ASA.  HPOA desired not hospital evaluation. Hgb 12.6 11/05/21

## 2022-01-07 NOTE — Progress Notes (Unsigned)
Location:  [place] Room Number: NO/31/A Place of Service:  SNF (31) Provider:  Jaquan Sadowsky X, NP Virgie Dad, MD  Patient Care Team: Virgie Dad, MD as PCP - General (Internal Medicine) Raijon Lindfors X, NP as Nurse Practitioner (Internal Medicine) Virgie Dad, MD as Consulting Physician (Internal Medicine) Virgie Dad, MD (Internal Medicine)  Extended Emergency Contact Information Primary Emergency Contact: Jeannine Kitten of [place] Phone: [phone] Relation: Son Secondary Emergency Contact: Cayton,[name] Mobile Phone: [phone] Relation: Son  Code Status:  DNR Goals of care: Advanced Directive information    01/07/2022    9:04 AM  Advanced Directives  Does Patient Have a Medical Advance Directive? Yes  Type of Paramedic of [place];Out of facility DNR (pink MOST or yellow form)  Does patient want to make changes to medical advance directive? No - Patient declined  Copy of [place] in Chart? Yes - validated most recent copy scanned in chart (See row information)  Pre-existing out of facility DNR order (yellow form or pink MOST form) Pink MOST form placed in chart (order not valid for inpatient use)     Chief Complaint  Patient presents with   Medical Management of Chronic Issues    Patient is here for a follow up for chronic conditions    Quality Metric Gaps    Patient is due for Dexa Scan, AWV, and annual flu vaccine    HPI:  Pt is a 88 y.o. female seen today for medical management of chronic diseases.   CHF, 09/30/21 CXR mild congestive heart failure. CXR 10/25/21 showed CHF, 10/29/21 CXR no infiltrate, effusion, or acute findings. Takes Furosemide, compensated clinically. Echo 06/08/08 EF 60%             Peripheral neuropathy, taking Gabapentin, Vit B12 Gait abnormality, uses walker to ambulate.               Hx of TIA. Allergic to statin. On ASA.  HPOA desired not  hospital evaluation. Hgb 12.6 11/05/21 Seizure like activity, likely post fall, off Tramadol, not taking anti seizure meds.  OA: T5, T8, L3 compression fxs. Takes, Tylenol, Gabapentin for pain, off Tramadol, DDD per CT head/cervical spine 08/02/20              Hx of CVA per CT head 08/02/20 Remote infarcts within the a occipital cortices bilaterally again noted. Interval development of remote infarcts within the left parietal lobe and periventricular white matte. On ASA             Vascular dementia, 07/15/19 CT head Mild generalized parenchymal atrophy and chronic small vessel ischemic disease, tolerated Memantine well.                          HTN, blood pressure is controlled on Benazepril, Amlodipine.              CKD Bun/creat 29/1.28 12/12/21             Gout stable, on Allopurinol                  Hypothyroidism, stable, on Levothyroxine. TSH 3.95 06/18/21             Abnormal CXR, suggested CT chest, not candidate for workup  Past Medical History:  Diagnosis Date   Cognitive changes    Dementia (HCC)    Depression    Gout  Hypertension    Hypothyroidism    Peripheral neuropathy    History reviewed. No pertinent surgical history.  Allergies  Allergen Reactions   Actonel [Risedronate Sodium]    Actonel [Risedronate] Other (See Comments)    "Allergic," per [facility]   Hct [Hydrochlorothiazide]    Hydrochlorothiazide Other (See Comments)    "Allergic," per MAR   Lipitor [Atorvastatin Calcium]    Lipitor [Atorvastatin] Other (See Comments)    "Allergic," per MAR   Neomycin Other (See Comments)    Unknown "been so long ago, a Dermatologist told me"   Neomycin Other (See Comments)    "Allergic," per [facility]   Polysporin [Bacitracin-Polymyxin B]    Polysporin [Bacitracin-Polymyxin B] Other (See Comments)    "Allergic," per MAR   Prolia [Denosumab]    Prolia [Denosumab] Other (See Comments)   Sulfa Antibiotics    Sulfa Antibiotics Other (See Comments)    "Allergic," per MAR   Zocor  [Simvastatin]    Zocor [Simvastatin] Other (See Comments)    "Allergic," per [facility]    Outpatient Encounter Medications as of 01/07/2022  Medication Sig   acetaminophen (TYLENOL) 325 MG tablet Take 650 mg by mouth in the morning, at noon, and at bedtime.   allopurinol (ZYLOPRIM) 100 MG tablet Take 100 mg by mouth daily.   amLODipine (NORVASC) 2.5 MG tablet Take 2.5 mg by mouth daily.   aspirin 81 MG chewable tablet Chew 81 mg by mouth daily.   benazepril (LOTENSIN) 10 MG tablet Take 10 mg by mouth in the morning.   Calcium Carb-Cholecalciferol 500-400 MG-UNIT TABS Take 2 tablets by mouth daily.    Cholecalciferol (VITAMIN D) 50 MCG (2000 UT) CAPS Take 2,000 Units by mouth daily.   Coenzyme Q10 (CO Q-10) 100 MG CAPS Take 200 mg by mouth in the morning.   furosemide (LASIX) 20 MG tablet Take 1 tablet (20 mg total) by mouth daily.   gabapentin (NEURONTIN) 100 MG capsule Take 200 mg by mouth at bedtime.   levothyroxine (SYNTHROID) 100 MCG tablet Take 100 mcg by mouth once a week. On Wednesday   levothyroxine (SYNTHROID) 50 MCG tablet Take 50 mcg by mouth daily before breakfast. Sun,Mon,Tue, Thur,Fri,Sat   meclizine (ANTIVERT) 12.5 MG tablet Take 12.5 mg by mouth 2 (two) times daily as needed for dizziness.   memantine (NAMENDA) 10 MG tablet Take 10 mg by mouth 2 (two) times daily.   Omega-3 Fatty Acids (OMEGA III EPA+DHA) 1000 MG CAPS Take 2,000 mg by mouth 2 (two) times daily.   potassium chloride (KLOR-CON M) 10 MEQ tablet Take 1 tablet (10 mEq total) by mouth 2 (two) times daily.   No facility-administered encounter medications on file as of 01/07/2022.    Review of Systems  Constitutional:  Negative for fatigue, fever and unexpected weight change.  HENT:  Positive for hearing loss. Negative for congestion and voice change.   Eyes:  Negative for visual disturbance.  Respiratory:  Negative for cough, shortness of breath and wheezing.        DOE  Cardiovascular:  Positive for leg swelling.   Gastrointestinal:  Negative for abdominal pain and constipation.  Genitourinary:  Negative for dysuria and urgency.  Musculoskeletal:  Positive for arthralgias, back pain and gait problem.       Positional lower  portion of the mid back pain. No c/o neck, right hip, or lower back pain.   Skin:  Negative for color change.  Neurological:  Negative for speech difficulty, weakness and light-headedness.  Psychiatric/Behavioral:  Positive for confusion. Negative for behavioral problems and sleep disturbance. The patient is not nervous/anxious.     Immunization History  Administered Date(s) Administered   Influenza, High Dose Seasonal PF 12/11/2018   Influenza-Unspecified 03/24/2018, 12/21/2019, 12/26/2020   Moderna SARS-COV2 Booster Vaccination 02/20/2021, 07/26/2021   Moderna Sars-Covid-2 Vaccination 03/12/2019, 04/09/2019, 04/21/2019, 01/17/2020, 08/07/2020   Pneumococcal Conjugate-13 09/09/2012, 10/28/2013   Pneumococcal Polysaccharide-23 04/02/2009, 04/16/2009   Tdap 07/15/2019   Zoster, Live 04/16/2009   Zoster, Unspecified 04/16/2009   Pertinent  Health Maintenance Due  Topic Date Due   DEXA SCAN  Never done   INFLUENZA VACCINE  10/08/2021      11/26/2020    8:30 PM 11/27/2020   10:00 AM 11/27/2020    8:00 PM 12/10/2021   11:47 AM 01/07/2022    9:04 AM  [place] in the past year?    0 0  Was there an injury with Fall?    0 0  Fall Risk Category Calculator    0 0  Fall Risk Category    Low Low  Patient Fall Risk Level High fall risk High fall risk High fall risk Moderate fall risk Moderate fall risk  Patient at Risk for Falls Due to    History of fall(s) History of fall(s)  Fall risk Follow up    Falls evaluation completed Falls evaluation completed   Functional Status Survey:    Vitals:   01/07/22 0906  BP: (!) 140/72  Pulse: 80  Resp: 17  Temp: (!) 97.4 F (36.3 C)  SpO2: 99%  Weight: 162 lb 12.8 oz (73.8 kg)  Height: '5\' 4"'$  (1.626 m)   Body mass index is  27.94 kg/m. Physical Exam Vitals and nursing note reviewed.  Constitutional:      Appearance: Normal appearance.  HENT:     Head: Normocephalic and atraumatic.     Nose: Nose normal.     Mouth/Throat:     Mouth: Mucous membranes are moist.  Eyes:     Extraocular Movements: Extraocular movements intact.     Conjunctiva/sclera: Conjunctivae normal.     Pupils: Pupils are equal, round, and reactive to light.  Cardiovascular:     Rate and Rhythm: Normal rate and regular rhythm.     Heart sounds: No murmur heard.    Comments: Weak dorsalis pedis pulses R+L Pulmonary:     Effort: Pulmonary effort is normal.     Breath sounds: Rales present. No wheezing or rhonchi.     Comments: Bibasilar rales.  Abdominal:     General: Bowel sounds are normal.     Palpations: Abdomen is soft.     Tenderness: There is no abdominal tenderness.  Musculoskeletal:     Cervical back: Normal range of motion and neck supple.     Right lower leg: Edema present.     Left lower leg: Edema present.     Comments: Trace-1+ edema BLE  Skin:    General: Skin is warm and dry.  Neurological:     General: No focal deficit present.     Mental Status: She is alert. Mental status is at baseline.     Motor: No weakness.     Coordination: Coordination normal.     Gait: Gait abnormal.  Psychiatric:        Mood and Affect: Mood normal.        Behavior: Behavior normal.     Labs reviewed: Recent Labs    10/01/21 0000 11/05/21 0000 11/12/21 0000  NA 141 142 139  K 4.0 4.1 4.5  CL 104 105 102  CO2 27* 27* 27*  BUN 20 25* 57*  CREATININE 1.3* 1.2* 1.6*  CALCIUM 9.3 9.3 9.2   Recent Labs    06/18/21 0000 10/01/21 0000 11/05/21 0000  AST '15 13 15  '$ ALT '12 9 9  '$ ALKPHOS 88 80 89  ALBUMIN 3.8 3.9 3.9   Recent Labs    02/20/21 0000 06/18/21 0000 10/01/21 0000 11/05/21 0000  WBC 7.2 7.9 7.4 8.8  NEUTROABS 4,918.00 5,925.00 5,469.00  --   HGB 11.8* 12.5 12.8 12.6  HCT 35* 37 38 37  PLT 208 226  --   231   Lab Results  Component Value Date   TSH 2.34 06/22/2020   No results found for: "HGBA1C" No results found for: "CHOL", "HDL", "LDLCALC", "LDLDIRECT", "TRIG", "CHOLHDL"  Significant Diagnostic Results in last 30 days:  No results found.  Assessment/Plan There are no diagnoses linked to this encounter.   Family/ staff Communication: ***  Labs/tests ordered:  ***

## 2022-01-07 NOTE — Assessment & Plan Note (Signed)
Bun/creat 29/1.28 12/12/21 

## 2022-01-07 NOTE — Assessment & Plan Note (Signed)
T5, T8, L3 compression fxs. Takes, Tylenol, Gabapentin for pain, off Tramadol, DDD per CT head/cervical spine 08/02/20. Ambulates with walker slowly.

## 2022-01-07 NOTE — Assessment & Plan Note (Signed)
Hx of CVA per CT head 08/02/20 Remote infarcts within the a occipital cortices bilaterally again noted. Interval development of remote infarcts within the left parietal lobe and periventricular white matte. On ASA

## 2022-01-07 NOTE — Assessment & Plan Note (Signed)
07/15/19 CT head Mild generalized parenchymal atrophy and chronic small vessel ischemic disease, tolerated Memantine well.     

## 2022-01-07 NOTE — Assessment & Plan Note (Signed)
Abnormal CXR, suggested CT chest, not candidate for workup

## 2022-01-07 NOTE — Assessment & Plan Note (Signed)
Stable, continue Allopurinol.  

## 2022-01-07 NOTE — Assessment & Plan Note (Signed)
blood pressure is controlled, continue Benazepril, Amlodipine.

## 2022-01-07 NOTE — Assessment & Plan Note (Signed)
Seizure like activity, likely post fall, off Tramadol, not taking anti seizure meds.  

## 2022-01-07 NOTE — Assessment & Plan Note (Addendum)
09/30/21 CXR mild congestive heart failure. CXR 10/25/21 showed CHF, 10/29/21 CXR no infiltrate, effusion, or acute findings. Takes Furosemide, compensated clinically. Echo 06/08/08 EF 60%

## 2022-01-07 NOTE — Assessment & Plan Note (Signed)
,   taking Gabapentin, Vit B12 

## 2022-01-07 NOTE — Assessment & Plan Note (Signed)
stable, on Levothyroxine. TSH 3.95 06/18/21  

## 2022-01-09 ENCOUNTER — Encounter: Payer: Self-pay | Admitting: Nurse Practitioner

## 2022-01-09 DIAGNOSIS — Z23 Encounter for immunization: Secondary | ICD-10-CM | POA: Diagnosis not present

## 2022-02-06 ENCOUNTER — Non-Acute Institutional Stay (SKILLED_NURSING_FACILITY): Payer: Medicare Other | Admitting: Nurse Practitioner

## 2022-02-06 ENCOUNTER — Encounter: Payer: Self-pay | Admitting: Nurse Practitioner

## 2022-02-06 DIAGNOSIS — N1831 Chronic kidney disease, stage 3a: Secondary | ICD-10-CM | POA: Diagnosis not present

## 2022-02-06 DIAGNOSIS — R569 Unspecified convulsions: Secondary | ICD-10-CM

## 2022-02-06 DIAGNOSIS — I10 Essential (primary) hypertension: Secondary | ICD-10-CM | POA: Diagnosis not present

## 2022-02-06 DIAGNOSIS — Z8673 Personal history of transient ischemic attack (TIA), and cerebral infarction without residual deficits: Secondary | ICD-10-CM

## 2022-02-06 DIAGNOSIS — G459 Transient cerebral ischemic attack, unspecified: Secondary | ICD-10-CM | POA: Diagnosis not present

## 2022-02-06 DIAGNOSIS — F03918 Unspecified dementia, unspecified severity, with other behavioral disturbance: Secondary | ICD-10-CM

## 2022-02-06 DIAGNOSIS — G609 Hereditary and idiopathic neuropathy, unspecified: Secondary | ICD-10-CM

## 2022-02-06 DIAGNOSIS — M159 Polyosteoarthritis, unspecified: Secondary | ICD-10-CM

## 2022-02-06 DIAGNOSIS — M109 Gout, unspecified: Secondary | ICD-10-CM

## 2022-02-06 DIAGNOSIS — E039 Hypothyroidism, unspecified: Secondary | ICD-10-CM | POA: Diagnosis not present

## 2022-02-06 NOTE — Progress Notes (Signed)
Location:  [place] Room Number: NO/31/A Place of Service:  SNF (31) Provider:  Breezie Micucci X, NP Virgie Dad, MD  Patient Care Team: Virgie Dad, MD as PCP - General (Internal Medicine) Priti Consoli X, NP as Nurse Practitioner (Internal Medicine) Virgie Dad, MD as Consulting Physician (Internal Medicine) Virgie Dad, MD (Internal Medicine)  Extended Emergency Contact Information Primary Emergency Contact: Jeannine Kitten of [place] Phone: [phone] Relation: Son Secondary Emergency Contact: Alonzo,[name] Mobile Phone: [phone] Relation: Son  Code Status:  DNR Goals of care: Advanced Directive information    02/06/2022   10:47 AM  Advanced Directives  Does Patient Have a Medical Advance Directive? Yes  Type of Paramedic of [place];Out of facility DNR (pink MOST or yellow form)  Does patient want to make changes to medical advance directive? No - Patient declined  Copy of [place] in Chart? Yes - validated most recent copy scanned in chart (See row information)  Pre-existing out of facility DNR order (yellow form or pink MOST form) Pink MOST form placed in chart (order not valid for inpatient use)     Chief Complaint  Patient presents with   Medical Management of Chronic Issues    Patient is here for a follow up for chronic conditions    Quality Metric Gaps    Patient is due for dexa scan and AWV Discuss need for shingrix vaccine    HPI:  Pt is a 88 y.o. female seen today for medical management of chronic diseases.   CHF, 09/30/21 CXR mild congestive heart failure. CXR 10/25/21 showed CHF, 10/29/21 CXR no infiltrate, effusion, or acute findings. Takes Furosemide, compensated clinically. Echo 06/08/08 EF 60%             Peripheral neuropathy, taking Gabapentin, Vit B12 Gait abnormality, uses walker to ambulate.               Hx of TIA. Allergic to statin. On ASA.  HPOA  desired not hospital evaluation. Hgb 12.6 11/05/21 Seizure like activity, likely post fall, off Tramadol, not taking anti seizure meds.  OA: T5, T8, L3 compression fxs. Takes, Tylenol, Gabapentin for pain, off Tramadol, DDD per CT head/cervical spine 08/02/20              Hx of CVA per CT head 08/02/20 Remote infarcts within the a occipital cortices bilaterally again noted. Interval development of remote infarcts within the left parietal lobe and periventricular white matte. On ASA             Vascular dementia, 07/15/19 CT head Mild generalized parenchymal atrophy and chronic small vessel ischemic disease, tolerated Memantine well.                          HTN, blood pressure is controlled on Benazepril, Amlodipine.              CKD Bun/creat 29/1.28 12/12/21             Gout stable, on Allopurinol                   Hypothyroidism, stable, on Levothyroxine. TSH 3.95 06/18/21              Abnormal CXR, suggested CT chest, not candidate for workup  Past Medical History:  Diagnosis Date   Cognitive changes    Dementia ()    Depression  Gout    Hypertension    Hypothyroidism    Peripheral neuropathy    History reviewed. No pertinent surgical history.  Allergies  Allergen Reactions   Actonel [Risedronate Sodium]    Actonel [Risedronate] Other (See Comments)    "Allergic," per [facility]   Hct [Hydrochlorothiazide]    Hydrochlorothiazide Other (See Comments)    "Allergic," per MAR   Lipitor [Atorvastatin Calcium]    Lipitor [Atorvastatin] Other (See Comments)    "Allergic," per [facility]   Neomycin Other (See Comments)    Unknown "been so long ago, a Dermatologist told me"   Neomycin Other (See Comments)    "Allergic," per [facility]   Polysporin [Bacitracin-Polymyxin B]    Polysporin [Bacitracin-Polymyxin B] Other (See Comments)    "Allergic," per MAR   Prolia [Denosumab]    Prolia [Denosumab] Other (See Comments)   Sulfa Antibiotics    Sulfa Antibiotics Other (See Comments)    "Allergic," per  MAR   Zocor [Simvastatin]    Zocor [Simvastatin] Other (See Comments)    "Allergic," per [facility]    Outpatient Encounter Medications as of 02/06/2022  Medication Sig   acetaminophen (TYLENOL) 325 MG tablet Take 650 mg by mouth in the morning, at noon, and at bedtime.   allopurinol (ZYLOPRIM) 100 MG tablet Take 100 mg by mouth daily.   amLODipine (NORVASC) 2.5 MG tablet Take 2.5 mg by mouth daily.   aspirin 81 MG chewable tablet Chew 81 mg by mouth daily.   benazepril (LOTENSIN) 10 MG tablet Take 10 mg by mouth in the morning.   Calcium Carb-Cholecalciferol 500-400 MG-UNIT TABS Take 2 tablets by mouth daily.    Cholecalciferol (VITAMIN D) 50 MCG (2000 UT) CAPS Take 2,000 Units by mouth daily.   Coenzyme Q10 (CO Q-10) 100 MG CAPS Take 200 mg by mouth in the morning.   furosemide (LASIX) 20 MG tablet Take 1 tablet (20 mg total) by mouth daily.   gabapentin (NEURONTIN) 100 MG capsule Take 200 mg by mouth at bedtime.   levothyroxine (SYNTHROID) 100 MCG tablet Take 100 mcg by mouth once a week. On Wednesday   levothyroxine (SYNTHROID) 50 MCG tablet Take 50 mcg by mouth daily before breakfast. Sun,Mon,Tue, Thur,Fri,Sat   meclizine (ANTIVERT) 12.5 MG tablet Take 12.5 mg by mouth 2 (two) times daily as needed for dizziness.   memantine (NAMENDA) 10 MG tablet Take 10 mg by mouth 2 (two) times daily.   Omega-3 Fatty Acids (OMEGA III EPA+DHA) 1000 MG CAPS Take 2,000 mg by mouth 2 (two) times daily.   potassium chloride (KLOR-CON M) 10 MEQ tablet Take 1 tablet (10 mEq total) by mouth 2 (two) times daily.   No facility-administered encounter medications on file as of 02/06/2022.    Review of Systems  Constitutional:  Negative for fatigue, fever and unexpected weight change.  HENT:  Positive for hearing loss. Negative for congestion and voice change.   Eyes:  Negative for visual disturbance.  Respiratory:  Negative for cough, shortness of breath and wheezing.        DOE  Cardiovascular:  Positive for  leg swelling.  Gastrointestinal:  Negative for abdominal pain and constipation.  Genitourinary:  Negative for dysuria and urgency.  Musculoskeletal:  Positive for arthralgias, back pain and gait problem.       Positional lower  portion of the mid back pain. No c/o neck, right hip, or lower back pain.   Skin:  Negative for color change.  Neurological:  Negative for speech difficulty, weakness  and light-headedness.  Psychiatric/Behavioral:  Positive for confusion. Negative for behavioral problems and sleep disturbance. The patient is not nervous/anxious.     Immunization History  Administered Date(s) Administered   Fluad Quad(high Dose 65+) 01/01/2022   Influenza, High Dose Seasonal PF 12/11/2018   Influenza-Unspecified 03/24/2018, 12/21/2019, 12/26/2020   Moderna Covid-19 Vaccine Bivalent Booster 74yr & up 01/09/2022   Moderna SARS-COV2 Booster Vaccination 02/20/2021, 07/26/2021   Moderna Sars-Covid-2 Vaccination 03/12/2019, 04/09/2019, 04/21/2019, 01/17/2020, 08/07/2020   Pneumococcal Conjugate-13 09/09/2012, 10/28/2013   Pneumococcal Polysaccharide-23 04/02/2009, 04/16/2009   Tdap 07/15/2019   Zoster, Live 04/16/2009   Zoster, Unspecified 04/16/2009   Pertinent  Health Maintenance Due  Topic Date Due   DEXA SCAN  Never done   INFLUENZA VACCINE  Completed      11/27/2020   10:00 AM 11/27/2020    8:00 PM 12/10/2021   11:47 AM 01/07/2022    9:04 AM 02/06/2022   10:48 AM  Fall Risk  Falls in the past year?   0 0 0  Was there an injury with Fall?   0 0 0  Fall Risk Category Calculator   0 0 0  Fall Risk Category   Low Low Low  Patient Fall Risk Level High fall risk High fall risk Moderate fall risk Moderate fall risk Moderate fall risk  Patient at Risk for Falls Due to   History of fall(s) History of fall(s) History of fall(s)  Fall risk Follow up   Falls evaluation completed Falls evaluation completed Falls evaluation completed   Functional Status Survey:    Vitals:    02/06/22 1053  BP: 138/65  Pulse: 76  Resp: 17  Temp: (!) 97.3 F (36.3 C)  SpO2: 96%  Weight: 173 lb (78.5 kg)  Height: '5\' 4"'$  (1.626 m)   Body mass index is 29.7 kg/m. Physical Exam Vitals and nursing note reviewed.  Constitutional:      Appearance: Normal appearance.  HENT:     Head: Normocephalic and atraumatic.     Nose: Nose normal.     Mouth/Throat:     Mouth: Mucous membranes are moist.  Eyes:     Extraocular Movements: Extraocular movements intact.     Conjunctiva/sclera: Conjunctivae normal.     Pupils: Pupils are equal, round, and reactive to light.  Cardiovascular:     Rate and Rhythm: Normal rate and regular rhythm.     Heart sounds: No murmur heard.    Comments: Weak dorsalis pedis pulses R+L from previous examination.  Pulmonary:     Effort: Pulmonary effort is normal.     Breath sounds: Rales present. No wheezing or rhonchi.     Comments: Bibasilar rales.  Abdominal:     General: Bowel sounds are normal.     Palpations: Abdomen is soft.     Tenderness: There is no abdominal tenderness.  Musculoskeletal:     Cervical back: Normal range of motion and neck supple.     Right lower leg: Edema present.     Left lower leg: Edema present.     Comments: Trace edema BLE  Skin:    General: Skin is warm and dry.  Neurological:     General: No focal deficit present.     Mental Status: She is alert. Mental status is at baseline.     Motor: No weakness.     Coordination: Coordination normal.     Gait: Gait abnormal.  Psychiatric:        Mood and Affect: Mood normal.  Behavior: Behavior normal.     Labs reviewed: Recent Labs    10/01/21 0000 11/05/21 0000 11/12/21 0000  NA 141 142 139  K 4.0 4.1 4.5  CL 104 105 102  CO2 27* 27* 27*  BUN 20 25* 57*  CREATININE 1.3* 1.2* 1.6*  CALCIUM 9.3 9.3 9.2   Recent Labs    06/18/21 0000 10/01/21 0000 11/05/21 0000  AST '15 13 15  '$ ALT '12 9 9  '$ ALKPHOS 88 80 89  ALBUMIN 3.8 3.9 3.9   Recent Labs     02/20/21 0000 06/18/21 0000 10/01/21 0000 11/05/21 0000  WBC 7.2 7.9 7.4 8.8  NEUTROABS 4,918.00 5,925.00 5,469.00  --   HGB 11.8* 12.5 12.8 12.6  HCT 35* 37 38 37  PLT 208 226  --  231   Lab Results  Component Value Date   TSH 2.34 06/22/2020   No results found for: "HGBA1C" No results found for: "CHOL", "HDL", "LDLCALC", "LDLDIRECT", "TRIG", "CHOLHDL"  Significant Diagnostic Results in last 30 days:  No results found.  Assessment/Plan Peripheral neuropathy Ambulates with walker, w/c to go further,  taking Gabapentin, Vit B12  TIA (transient ischemic attack)  Allergic to statin. On ASA.  HPOA desired not hospital evaluation. Hgb 12.6 11/05/21  Seizure-like activity (HCC) likely post fall, off Tramadol, not taking anti seizure meds.   Generalized osteoarthritis of multiple sites  T5, T8, L3 compression fxs. Takes, Tylenol, Gabapentin for pain, off Tramadol, DDD per CT head/cervical spine 08/02/20   History of CVA (cerebrovascular accident) Hx of CVA per CT head 08/02/20 Remote infarcts within the a occipital cortices bilaterally again noted. Interval development of remote infarcts within the left parietal lobe and periventricular white matte. On ASA  Dementia with behavioral disturbance ([place])  07/15/19 CT head Mild generalized parenchymal atrophy and chronic small vessel ischemic disease, tolerated Memantine well.           HTN (hypertension)  07/15/19 CT head Mild generalized parenchymal atrophy and chronic small vessel ischemic disease, tolerated Memantine well.           CKD (chronic kidney disease) stage 3, GFR 30-59 ml/min (HCC) Bun/creat 29/1.28 12/12/21  Gout stable, on Allopurinol       Hypothyroidism stable, on Levothyroxine. TSH 3.95 06/18/21      Family/ staff Communication: plan of care reviewed with the patient and charge nurse.   Labs/tests ordered: none  Time spend 35 minutes.

## 2022-02-11 ENCOUNTER — Encounter: Payer: Self-pay | Admitting: Nurse Practitioner

## 2022-02-11 NOTE — Assessment & Plan Note (Signed)
T5, T8, L3 compression fxs. Takes, Tylenol, Gabapentin for pain, off Tramadol, DDD per CT head/cervical spine 08/02/20  

## 2022-02-11 NOTE — Assessment & Plan Note (Signed)
Ambulates with walker, w/c to go further,  taking Gabapentin, Vit B12

## 2022-02-11 NOTE — Assessment & Plan Note (Signed)
stable, on Allopurinol       

## 2022-02-11 NOTE — Assessment & Plan Note (Signed)
Hx of CVA per CT head 08/02/20 Remote infarcts within the a occipital cortices bilaterally again noted. Interval development of remote infarcts within the left parietal lobe and periventricular white matte. On ASA

## 2022-02-11 NOTE — Assessment & Plan Note (Signed)
Bun/creat 29/1.28 12/12/21 

## 2022-02-11 NOTE — Assessment & Plan Note (Signed)
likely post fall, off Tramadol, not taking anti seizure meds.

## 2022-02-11 NOTE — Assessment & Plan Note (Signed)
Allergic to statin. On ASA.  HPOA desired not hospital evaluation. Hgb 12.6 11/05/21

## 2022-02-11 NOTE — Assessment & Plan Note (Signed)
stable, on Levothyroxine. TSH 3.95 06/18/21  

## 2022-02-11 NOTE — Assessment & Plan Note (Signed)
07/15/19 CT head Mild generalized parenchymal atrophy and chronic small vessel ischemic disease, tolerated Memantine well.     

## 2022-03-04 ENCOUNTER — Non-Acute Institutional Stay (SKILLED_NURSING_FACILITY): Payer: Medicare Other | Admitting: Family Medicine

## 2022-03-04 DIAGNOSIS — R609 Edema, unspecified: Secondary | ICD-10-CM | POA: Diagnosis not present

## 2022-03-04 DIAGNOSIS — M159 Polyosteoarthritis, unspecified: Secondary | ICD-10-CM

## 2022-03-04 DIAGNOSIS — I509 Heart failure, unspecified: Secondary | ICD-10-CM | POA: Diagnosis not present

## 2022-03-04 DIAGNOSIS — F03918 Unspecified dementia, unspecified severity, with other behavioral disturbance: Secondary | ICD-10-CM

## 2022-03-04 DIAGNOSIS — G609 Hereditary and idiopathic neuropathy, unspecified: Secondary | ICD-10-CM | POA: Diagnosis not present

## 2022-03-04 DIAGNOSIS — R2681 Unsteadiness on feet: Secondary | ICD-10-CM

## 2022-03-04 NOTE — Progress Notes (Unsigned)
Provider:  Alain Honey, MD Location:      Place of Service:     PCP: Virgie Dad, MD Patient Care Team: Virgie Dad, MD as PCP - General (Internal Medicine) Mast, Man X, NP as Nurse Practitioner (Internal Medicine) Virgie Dad, MD as Consulting Physician (Internal Medicine) Virgie Dad, MD (Internal Medicine)  Extended Emergency Contact Information Primary Emergency Contact: Jeannine Kitten of [place] Phone: [phone] Relation: Son Secondary Emergency Contact: Affinito,[name] Mobile Phone: [phone] Relation: Son  Code Status:  Goals of Care: Advanced Directive information    02/06/2022   10:47 AM  Advanced Directives  Does Patient Have a Medical Advance Directive? Yes  Type of Paramedic of [place];Out of facility DNR (pink MOST or yellow form)  Does patient want to make changes to medical advance directive? No - Patient declined  Copy of [place] in Chart? Yes - validated most recent copy scanned in chart (See row information)  Pre-existing out of facility DNR order (yellow form or pink MOST form) Pink MOST form placed in chart (order not valid for inpatient use)      No chief complaint on file.   HPI: Patient is a 88 y.o. female seen today for medical management of chronic problems including dementia hypertension, hypothyroidism, peripheral neuropathy, and gout.  I encountered patient in her room watching TV.  She denies any new aches or pains.  States that she eats too well and sleeps well.  She denies any recent falls and uses her walker for ambulation.  Nursing reports no new problems She also has a history of congestive heart failure that is well compensated taking furosemide. There is a history of compression fractures that is treated with Tylenol and gabapentin Patient is given memantine for vascular dementia.  There have been no behaviors.  Blood pressure is controlled on benazepril and  amlodipine  Past Medical History:  Diagnosis Date   Cognitive changes    Dementia ([place])    Depression    Gout    Hypertension    Hypothyroidism    Peripheral neuropathy    No past surgical history on file.  reports that she has an unknown smoking status. She has never used smokeless tobacco. She reports that she does not drink alcohol and does not use drugs. Social History   Socioeconomic History   Marital status: Widowed    Spouse name: Not on file   Number of children: Not on file   Years of education: Not on file   Highest education level: Not on file  Occupational History   Not on file  Tobacco Use   Smoking status: Unknown   Smokeless tobacco: Never  Vaping Use   Vaping Use: Never used  Substance and Sexual Activity   Alcohol use: Never   Drug use: Never   Sexual activity: Not on file  Other Topics Concern   Not on file  Social History Narrative   ** Merged History Encounter **       Social Determinants of Health   Financial Resource Strain: Not on file  Food Insecurity: Not on file  Transportation Needs: Not on file  Physical Activity: Not on file  Stress: Not on file  Social Connections: Not on file  Intimate Partner Violence: Not on file    Functional Status Survey:    No family history on file.  Health Maintenance  Topic Date Due   Zoster Vaccines- Shingrix (1 of 2)  01/07/1977   DEXA SCAN  Never done   Medicare Annual Wellness (AWV)  03/25/2019   COVID-19 Vaccine (9 - 2023-24 season) 03/06/2022   DTaP/Tdap/Td (2 - Td or Tdap) 07/14/2029   Pneumonia Vaccine 64+ Years old  Completed   INFLUENZA VACCINE  Completed   HPV VACCINES  Aged Out    Allergies  Allergen Reactions   Actonel [Risedronate Sodium]    Actonel [Risedronate] Other (See Comments)    "Allergic," per [facility]   Hct [Hydrochlorothiazide]    Hydrochlorothiazide Other (See Comments)    "Allergic," per [facility]   Lipitor [Atorvastatin Calcium]    Lipitor [Atorvastatin] Other (See  Comments)    "Allergic," per [facility]   Neomycin Other (See Comments)    Unknown "been so long ago, a Dermatologist told me"   Neomycin Other (See Comments)    "Allergic," per [facility]   Polysporin [Bacitracin-Polymyxin B]    Polysporin [Bacitracin-Polymyxin B] Other (See Comments)    "Allergic," per [facility]   Prolia [Denosumab]    Prolia [Denosumab] Other (See Comments)   Sulfa Antibiotics    Sulfa Antibiotics Other (See Comments)    "Allergic," per [facility]   Zocor [Simvastatin]    Zocor [Simvastatin] Other (See Comments)    "Allergic," per [facility]    Outpatient Encounter Medications as of 03/04/2022  Medication Sig   acetaminophen (TYLENOL) 325 MG tablet Take 650 mg by mouth in the morning, at noon, and at bedtime.   allopurinol (ZYLOPRIM) 100 MG tablet Take 100 mg by mouth daily.   amLODipine (NORVASC) 2.5 MG tablet Take 2.5 mg by mouth daily.   aspirin 81 MG chewable tablet Chew 81 mg by mouth daily.   benazepril (LOTENSIN) 10 MG tablet Take 10 mg by mouth in the morning.   Calcium Carb-Cholecalciferol 500-400 MG-UNIT TABS Take 2 tablets by mouth daily.    Cholecalciferol (VITAMIN D) 50 MCG (2000 UT) CAPS Take 2,000 Units by mouth daily.   Coenzyme Q10 (CO Q-10) 100 MG CAPS Take 200 mg by mouth in the morning.   furosemide (LASIX) 20 MG tablet Take 1 tablet (20 mg total) by mouth daily.   gabapentin (NEURONTIN) 100 MG capsule Take 200 mg by mouth at bedtime.   levothyroxine (SYNTHROID) 100 MCG tablet Take 100 mcg by mouth once a week. On Wednesday   levothyroxine (SYNTHROID) 50 MCG tablet Take 50 mcg by mouth daily before breakfast. Sun,Mon,Tue, Thur,Fri,Sat   meclizine (ANTIVERT) 12.5 MG tablet Take 12.5 mg by mouth 2 (two) times daily as needed for dizziness.   memantine (NAMENDA) 10 MG tablet Take 10 mg by mouth 2 (two) times daily.   Omega-3 Fatty Acids (OMEGA III EPA+DHA) 1000 MG CAPS Take 2,000 mg by mouth 2 (two) times daily.   potassium chloride (KLOR-CON M) 10 MEQ tablet Take 1 tablet (10  mEq total) by mouth 2 (two) times daily.   No facility-administered encounter medications on file as of 03/04/2022.    Review of Systems  Constitutional: Negative.   HENT: Negative.    Respiratory: Negative.    Cardiovascular:  Positive for leg swelling.  Musculoskeletal:  Positive for back pain and gait problem.  Psychiatric/Behavioral:  Positive for confusion.     There were no vitals filed for this visit. There is no height or weight on file to calculate BMI. Physical Exam Vitals and nursing note reviewed.  Constitutional:      Appearance: Normal appearance. She is obese.  Eyes:     Extraocular Movements: Extraocular movements intact.  Pupils: Pupils are equal, round, and reactive to light.  Cardiovascular:     Rate and Rhythm: Normal rate and regular rhythm.     Heart sounds: Murmur heard.  Pulmonary:     Effort: Pulmonary effort is normal.     Breath sounds: Normal breath sounds.  Abdominal:     General: Bowel sounds are normal.     Palpations: Abdomen is soft.  Skin:    General: Skin is warm and dry.  Neurological:     General: No focal deficit present.     Mental Status: She is alert.     Comments: Patient is oriented to self  Psychiatric:        Mood and Affect: Mood normal.        Behavior: Behavior normal.     Labs reviewed: Basic Metabolic Panel: Recent Labs    10/01/21 0000 11/05/21 0000 11/12/21 0000  NA 141 142 139  K 4.0 4.1 4.5  CL 104 105 102  CO2 27* 27* 27*  BUN 20 25* 57*  CREATININE 1.3* 1.2* 1.6*  CALCIUM 9.3 9.3 9.2   Liver Function Tests: Recent Labs    06/18/21 0000 10/01/21 0000 11/05/21 0000  AST '15 13 15  '$ ALT '12 9 9  '$ ALKPHOS 88 80 89  ALBUMIN 3.8 3.9 3.9   No results for input(s): "LIPASE", "AMYLASE" in the last 8760 hours. No results for input(s): "AMMONIA" in the last 8760 hours. CBC: Recent Labs    06/18/21 0000 10/01/21 0000 11/05/21 0000  WBC 7.9 7.4 8.8  NEUTROABS 5,925.00 5,469.00  --   HGB 12.5 12.8  12.6  HCT 37 38 37  PLT 226  --  231   Cardiac Enzymes: No results for input(s): "CKTOTAL", "CKMB", "CKMBINDEX", "TROPONINI" in the last 8760 hours. BNP: Invalid input(s): "POCBNP" No results found for: "HGBA1C" Lab Results  Component Value Date   TSH 2.34 06/22/2020   No results found for: "VITAMINB12" No results found for: "FOLATE" No results found for: "IRON", "TIBC", "FERRITIN"  Imaging and Procedures obtained prior to SNF admission: MR BRAIN WO CONTRAST  Result Date: 11/25/2020 CLINICAL DATA:  TIA EXAM: MRI HEAD WITHOUT CONTRAST MRA HEAD WITHOUT CONTRAST TECHNIQUE: Multiplanar, multi-echo pulse sequences of the brain and surrounding structures were acquired without intravenous contrast. Angiographic images of the Circle of Willis were acquired using MRA technique without intravenous contrast. COMPARISON:  Head CT from yesterday FINDINGS: MRI HEAD FINDINGS Brain: No acute infarction, hemorrhage, hydrocephalus, extra-axial collection or mass lesion. Remote bilateral occipital and left parietal cortically based infarcts. Chronic small vessel ischemia in the deep white matter which is mild for age. Nonspecific cerebral volume loss. Vascular: Major flow voids are preserved Skull and upper cervical spine: Posterior scalp swelling. No evidence of bone lesion. Sinuses/Orbits: Bilateral cataract resection.  No evidence of injury Other: Moderate motion artifact to the degree that findings could be obscured. MRA HEAD FINDINGS Significant motion artifact intermittently, especially at the level of the circle-of-Willis. The covered vertebral, basilar, and carotid arteries are patent. A1, M1, and P1 segments are largely obscured by motion artifact. No detected branch occlusion or aneurysm. IMPRESSION: Brain MRI: 1. No acute intracranial finding.  No acute infarct. 2. Chronic small vessel disease and small remote cortical infarcts. 3. Posterior scalp contusion. 4. Moderate motion artifact Intracranial MRA:  Motion artifact with multiple nondiagnostic slices. Unremarkable vessels where visible. Electronically Signed   By: Jorje Guild M.D.   On: 11/25/2020 06:39   MR ANGIO HEAD WO CONTRAST  Result Date: 11/25/2020 CLINICAL DATA:  TIA EXAM: MRI HEAD WITHOUT CONTRAST MRA HEAD WITHOUT CONTRAST TECHNIQUE: Multiplanar, multi-echo pulse sequences of the brain and surrounding structures were acquired without intravenous contrast. Angiographic images of the Circle of Willis were acquired using MRA technique without intravenous contrast. COMPARISON:  Head CT from yesterday FINDINGS: MRI HEAD FINDINGS Brain: No acute infarction, hemorrhage, hydrocephalus, extra-axial collection or mass lesion. Remote bilateral occipital and left parietal cortically based infarcts. Chronic small vessel ischemia in the deep white matter which is mild for age. Nonspecific cerebral volume loss. Vascular: Major flow voids are preserved Skull and upper cervical spine: Posterior scalp swelling. No evidence of bone lesion. Sinuses/Orbits: Bilateral cataract resection.  No evidence of injury Other: Moderate motion artifact to the degree that findings could be obscured. MRA HEAD FINDINGS Significant motion artifact intermittently, especially at the level of the circle-of-Willis. The covered vertebral, basilar, and carotid arteries are patent. A1, M1, and P1 segments are largely obscured by motion artifact. No detected branch occlusion or aneurysm. IMPRESSION: Brain MRI: 1. No acute intracranial finding.  No acute infarct. 2. Chronic small vessel disease and small remote cortical infarcts. 3. Posterior scalp contusion. 4. Moderate motion artifact Intracranial MRA: Motion artifact with multiple nondiagnostic slices. Unremarkable vessels where visible. Electronically Signed   By: Jorje Guild M.D.   On: 11/25/2020 06:39   DG Pelvis Portable  Result Date: 11/25/2020 CLINICAL DATA:  Fall at home. EXAM: PORTABLE PELVIS 1-2 VIEWS COMPARISON:  None.  FINDINGS: There is no evidence of pelvic fracture or diastasis. No pelvic bone lesions are seen. Atherosclerosis and osteopenia. IMPRESSION: No acute finding Electronically Signed   By: Jorje Guild M.D.   On: 11/25/2020 04:11   DG Abd Portable 1V  Result Date: 11/25/2020 CLINICAL DATA:  Fall at home EXAM: PORTABLE ABDOMEN - 1 VIEW COMPARISON:  None. FINDINGS: Normal bowel gas pattern. No concerning mass effect or calcification. Generalized osteopenia and lumbar spine degeneration. L1 and L2 wedging, chronic at L2 when compared to a 2011 abdominal CT. Given the degree of T12-L1 spurring, L1 wedging is likely also chronic. IMPRESSION: Normal bowel gas pattern. Electronically Signed   By: Jorje Guild M.D.   On: 11/25/2020 04:11   EEG adult  Result Date: 11/24/2020 Derek Jack, MD     11/24/2020  8:40 PM Routine EEG Report LISSETT FAVORITE is a 88 y.o. female with a history of spell who is undergoing an EEG to evaluate for seizures. Report: This EEG was acquired with electrodes placed according to the International 10-20 electrode system (including Fp1, Fp2, F3, F4, C3, C4, P3, P4, O1, O2, T3, T4, T5, T6, A1, A2, Fz, Cz, Pz). The following electrodes were missing or displaced: none. There was no waking occipital dominant rhythm. The best background was 6-7 Hz. This activity is reactive to stimulation. Sleep was identified by K complexes and sleep spindles. There was no focal slowing. There were no definitive interictal epileptiform discharges. There were no electrographic seizures identified. Photic stimulation and hyperventilation were not performed. Impression and clinical correlation: This EEG was obtained while asleep and is abnormal due to mild-to-moderate diffuse slowing. There were no electrographic seizures or definitive epileptiform abnormalities seen during this recording. Su Monks, MD [facility] [phone] If 7pm- 7am, please page neurology on call as listed in [place].    CT HEAD WO CONTRAST  Result Date: 11/24/2020 CLINICAL DATA:  Altered mental status, fall EXAM: CT HEAD WITHOUT CONTRAST TECHNIQUE: Contiguous axial images were obtained from the  base of the skull through the vertex without intravenous contrast. COMPARISON:  CT head 08/02/2020 FINDINGS: Brain: There is no evidence of acute intracranial hemorrhage, extra-axial fluid collection, or acute infarct. A remote infarct in the left parietal lobe is unchanged. Mild parenchymal volume loss and chronic white matter microangiopathy are unchanged. The ventricles are stable in size. There is no mass lesion. There is no midline shift. Vascular: There is calcification of the bilateral cavernous ICAs. Skull: Normal. Negative for fracture or focal lesion. Sinuses/Orbits: The paranasal sinuses are clear. The globes and orbits are unremarkable. Other: None. IMPRESSION: 1. No acute intracranial pathology. 2. Unchanged remote left parietal lobe infarct, parenchymal volume loss, and chronic white matter microangiopathy. Electronically Signed   By: Valetta Mole M.D.   On: 11/24/2020 14:29   CT CERVICAL SPINE WO CONTRAST  Result Date: 11/24/2020 CLINICAL DATA:  Trauma, fall EXAM: CT CERVICAL SPINE WITHOUT CONTRAST TECHNIQUE: Multidetector CT imaging of the cervical spine was performed without intravenous contrast. Multiplanar CT image reconstructions were also generated. COMPARISON:  CT cervical spine 08/02/2020 FINDINGS: Alignment: There is straightened curvature of the cervical spine with slight focal kyphosis centered at C5, similar to the prior study. There is grade 1 anterolisthesis of C3 on C4 and C4 on C5, also unchanged and likely degenerative in nature. There is no evidence of traumatic malalignment. There is no jumped or perched facet. Skull base and vertebrae: Skull base alignment is maintained. Vertebral body heights are preserved. There is no evidence of acute fracture. Soft tissues and spinal canal: No prevertebral  fluid or swelling. No visible canal hematoma. Disc levels: There is marked intervertebral disc space narrowing at C5-C6, similar to the prior study. Degenerative endplate changes also most advanced at this level. There is multilevel facet arthropathy, similar to the prior study. There is mild narrowing of the craniocervical junction. The osseous spinal canal is otherwise patent. Upper chest: There is mosaic attenuation in the lung apices with mild smooth interlobular septal thickening. Other: The left thyroid lobe is surgically absent. The soft tissues are otherwise unremarkable. IMPRESSION: 1. No acute fracture or traumatic malalignment of the cervical spine. 2. Multilevel degenerative changes as above, similar to the prior study. 3. Smooth interlobular septal thickening in the lung apices can be seen with pulmonary interstitial edema. Mosaic attenuation in the lung apices suggests air trapping/small airway disease. Electronically Signed   By: Valetta Mole M.D.   On: 11/24/2020 14:22   DG Chest Portable 1 View  Result Date: 11/24/2020 CLINICAL DATA:  Syncope. EXAM: PORTABLE CHEST 1 VIEW COMPARISON:  Jul 15, 2019 FINDINGS: Stable cardiomegaly. The hila and mediastinum are unremarkable. No pneumothorax. No overt edema or focal infiltrate. Nodular density projected over the right base is favored to represent confluence of shadows or atelectasis. No other acute abnormalities. IMPRESSION: A nodular density projected over the right base is favored to represent confluence of shadows or atelectasis. Recommend a PA and lateral chest x-[place] before discharge. No acute abnormalities are seen. Electronically Signed   By: Dorise Bullion III M.D.   On: 11/24/2020 13:45    Assessment/Plan 1. Dementia with behavioral disturbance [facility]) Patient seemed confused.  Answers simple questions appropriately  2. Chronic congestive heart failure, unspecified heart failure type ([place]) Compensated although she does have some 1+  edema  3. Edema, peripheral Continue on furosemide 20 mg daily  4. Gait instability  Uses walker for ambulation without any recent falls 5. Generalized osteoarthritis of multiple sites Pain is treated with gabapentin and  acetaminophen  6. Idiopathic peripheral neuropathy Takes 200 mg gabapentin at bedtime this seems to be effective    Family/ staff Communication:   Labs/tests ordered:  .smmsig

## 2022-03-28 ENCOUNTER — Non-Acute Institutional Stay (SKILLED_NURSING_FACILITY): Payer: Medicare Other | Admitting: Nurse Practitioner

## 2022-03-28 ENCOUNTER — Encounter: Payer: Self-pay | Admitting: Nurse Practitioner

## 2022-03-28 DIAGNOSIS — R2681 Unsteadiness on feet: Secondary | ICD-10-CM

## 2022-03-28 DIAGNOSIS — I10 Essential (primary) hypertension: Secondary | ICD-10-CM

## 2022-03-28 DIAGNOSIS — N1831 Chronic kidney disease, stage 3a: Secondary | ICD-10-CM

## 2022-03-28 DIAGNOSIS — F0390 Unspecified dementia without behavioral disturbance: Secondary | ICD-10-CM

## 2022-03-28 DIAGNOSIS — E039 Hypothyroidism, unspecified: Secondary | ICD-10-CM | POA: Diagnosis not present

## 2022-03-28 DIAGNOSIS — R9389 Abnormal findings on diagnostic imaging of other specified body structures: Secondary | ICD-10-CM

## 2022-03-28 DIAGNOSIS — I509 Heart failure, unspecified: Secondary | ICD-10-CM

## 2022-03-28 DIAGNOSIS — M159 Polyosteoarthritis, unspecified: Secondary | ICD-10-CM

## 2022-03-28 DIAGNOSIS — M109 Gout, unspecified: Secondary | ICD-10-CM

## 2022-03-28 DIAGNOSIS — Z8673 Personal history of transient ischemic attack (TIA), and cerebral infarction without residual deficits: Secondary | ICD-10-CM

## 2022-03-28 NOTE — Assessment & Plan Note (Signed)
07/15/19 CT head Mild generalized parenchymal atrophy and chronic small vessel ischemic disease, tolerated Memantine well.     

## 2022-03-28 NOTE — Assessment & Plan Note (Signed)
blood pressure is controlled on Benazepril, Amlodipine.  

## 2022-03-28 NOTE — Assessment & Plan Note (Signed)
Hx of CVA per CT head 08/02/20 Remote infarcts within the a occipital cortices bilaterally again noted. Interval development of remote infarcts within the left parietal lobe and periventricular white matte. On ASA

## 2022-03-28 NOTE — Assessment & Plan Note (Signed)
Bun/creat 29/1.28 12/12/21 

## 2022-03-28 NOTE — Assessment & Plan Note (Signed)
No flare ups, on Allopurinol

## 2022-03-28 NOTE — Assessment & Plan Note (Signed)
T5, T8, L3 compression fxs. Takes, Tylenol, Gabapentin for pain, off Tramadol, DDD per CT head/cervical spine 08/02/20  

## 2022-03-28 NOTE — Assessment & Plan Note (Signed)
09/30/21 CXR mild congestive heart failure. compensated, CXR 10/25/21 showed CHF. Takes Furosemide, compensated clinically. Echo 06/08/08 EF 60% 

## 2022-03-28 NOTE — Assessment & Plan Note (Signed)
Abnormal CXR, suggested CT chest, not candidate for workup

## 2022-03-28 NOTE — Assessment & Plan Note (Signed)
Peripheral neuropathy, taking Gabapentin, Vit B12. Gait abnormality, uses walker to ambulate.

## 2022-03-28 NOTE — Assessment & Plan Note (Signed)
stable, on Levothyroxine. TSH 3.95 06/18/21  

## 2022-03-28 NOTE — Progress Notes (Signed)
Location:  [facility] Room Number: 31-A Place of Service:  SNF [phone]) Provider:  [place] Lions, MD  Patient Care Team: Virgie Dad, MD as PCP - General (Internal Medicine) Shanekia Latella X, NP as Nurse Practitioner (Internal Medicine) Virgie Dad, MD as Consulting Physician (Internal Medicine) Virgie Dad, MD (Internal Medicine)  Extended Emergency Contact Information Primary Emergency Contact: Jeannine Kitten of [place] Phone: [phone] Relation: Son Secondary Emergency Contact: Fassnacht,[name] Mobile Phone: [phone] Relation: Son  Code Status:  DNR Goals of care: Advanced Directive information    03/28/2022    9:06 AM  Advanced Directives  Does Patient Have a Medical Advance Directive? Yes  Type of Paramedic of [facility];Out of facility DNR (pink MOST or yellow form)  Does patient want to make changes to medical advance directive? No - Patient declined  Copy of [place] in Chart? Yes - validated most recent copy scanned in chart (See row information)  Pre-existing out of facility DNR order (yellow form or pink MOST form) Pink Most/Yellow Form available - Physician notified to receive inpatient order     Chief Complaint  Patient presents with   Routine    HPI:  Pt is a 89 y.o. female seen today for medical management of chronic diseases.    CHF, 09/30/21 CXR mild congestive heart failure. compensated, CXR 10/25/21 showed CHF. Takes Furosemide, compensated clinically. Echo 06/08/08 EF 60%             Peripheral neuropathy, taking Gabapentin, Vit B12 Gait abnormality, uses walker to ambulate.        Hx of TIA. Allergic to statin. On ASA.  HPOA desired not hospital evaluation. Hgb 12.6 11/05/21 Seizure like activity, likely post fall, off Tramadol, not taking anti seizure meds.  OA: T5, T8, L3 compression fxs. Takes, Tylenol, Gabapentin for pain, off Tramadol, DDD per  CT head/cervical spine 08/02/20              Hx of CVA per CT head 08/02/20 Remote infarcts within the a occipital cortices bilaterally again noted. Interval development of remote infarcts within the left parietal lobe and periventricular white matte. On ASA             Vascular dementia, 07/15/19 CT head Mild generalized parenchymal atrophy and chronic small vessel ischemic disease, tolerated Memantine well.                          HTN, blood pressure is controlled on Benazepril, Amlodipine.              CKD Bun/creat 29/1.28 12/12/21             Gout stable, on Allopurinol                   Hypothyroidism, stable, on Levothyroxine. TSH 3.95 06/18/21              Abnormal CXR, suggested CT chest, not candidate for workup Past Medical History:  Diagnosis Date   Cognitive changes    Dementia ([place])    Depression    Gout    Hypertension    Hypothyroidism    Peripheral neuropathy    History reviewed. No pertinent surgical history.  Allergies  Allergen Reactions   Actonel [Risedronate Sodium]    Actonel [Risedronate] Other (See Comments)    "Allergic," per [facility]   Hct [Hydrochlorothiazide]  Hydrochlorothiazide Other (See Comments)    "Allergic," per [facility]   Lipitor [Atorvastatin Calcium]    Lipitor [Atorvastatin] Other (See Comments)    "Allergic," per [facility]   Neomycin Other (See Comments)    Unknown "been so long ago, a Dermatologist told me"   Neomycin Other (See Comments)    "Allergic," per [facility]   Polysporin [Bacitracin-Polymyxin B]    Polysporin [Bacitracin-Polymyxin B] Other (See Comments)    "Allergic," per MAR   Prolia [Denosumab]    Prolia [Denosumab] Other (See Comments)   Sulfa Antibiotics    Sulfa Antibiotics Other (See Comments)    "Allergic," per MAR   Zocor [Simvastatin]    Zocor [Simvastatin] Other (See Comments)    "Allergic," per [facility]    Outpatient Encounter Medications as of 03/28/2022  Medication Sig   acetaminophen (TYLENOL) 325 MG tablet Take 650 mg by mouth in  the morning, at noon, and at bedtime.   allopurinol (ZYLOPRIM) 100 MG tablet Take 100 mg by mouth daily.   amLODipine (NORVASC) 2.5 MG tablet Take 2.5 mg by mouth daily.   aspirin 81 MG chewable tablet Chew 81 mg by mouth daily.   benazepril (LOTENSIN) 10 MG tablet Take 10 mg by mouth in the morning.   Calcium Carb-Cholecalciferol 500-400 MG-UNIT TABS Take 2 tablets by mouth daily.    Cholecalciferol (VITAMIN D) 50 MCG (2000 UT) CAPS Take 2,000 Units by mouth daily.   Coenzyme Q10 (CO Q-10) 100 MG CAPS Take 200 mg by mouth in the morning.   furosemide (LASIX) 20 MG tablet Take 1 tablet (20 mg total) by mouth daily.   gabapentin (NEURONTIN) 100 MG capsule Take 200 mg by mouth at bedtime.   levothyroxine (SYNTHROID) 100 MCG tablet Take 100 mcg by mouth once a week. On Wednesday   levothyroxine (SYNTHROID) 50 MCG tablet Take 50 mcg by mouth daily before breakfast. Sun,Mon,Tue, Thur,Fri,Sat   meclizine (ANTIVERT) 12.5 MG tablet Take 12.5 mg by mouth 2 (two) times daily as needed for dizziness.   memantine (NAMENDA) 10 MG tablet Take 10 mg by mouth 2 (two) times daily.   Omega-3 Fatty Acids (OMEGA III EPA+DHA) 1000 MG CAPS Take 2,000 mg by mouth 2 (two) times daily.   potassium chloride (KLOR-CON M) 10 MEQ tablet Take 1 tablet (10 mEq total) by mouth 2 (two) times daily.   No facility-administered encounter medications on file as of 03/28/2022.    Review of Systems  Constitutional:  Negative for fatigue, fever and unexpected weight change.  HENT:  Positive for hearing loss. Negative for congestion and voice change.   Eyes:  Negative for visual disturbance.  Respiratory:  Negative for cough, shortness of breath and wheezing.        DOE  Cardiovascular:  Positive for leg swelling.  Gastrointestinal:  Negative for abdominal pain and constipation.  Genitourinary:  Negative for dysuria and urgency.  Musculoskeletal:  Positive for arthralgias, back pain and gait problem.       Positional lower   portion of the mid back pain. No c/o neck, right hip, or lower back pain.   Skin:  Negative for color change.  Neurological:  Negative for speech difficulty, weakness and light-headedness.  Psychiatric/Behavioral:  Positive for confusion. Negative for behavioral problems and sleep disturbance. The patient is not nervous/anxious.     Immunization History  Administered Date(s) Administered   Fluad Quad(high Dose 65+) 01/01/2022   Influenza, High Dose Seasonal PF 12/11/2018, 01/01/2022   Influenza-Unspecified 03/24/2018, 12/21/2019, 12/26/2020  Moderna Covid-19 Vaccine Bivalent Booster 53yr & up 01/09/2022   Moderna SARS-COV2 Booster Vaccination 02/20/2021, 07/26/2021   Moderna Sars-Covid-2 Vaccination 03/12/2019, 04/09/2019, 04/21/2019, 01/17/2020, 08/07/2020   Pneumococcal Conjugate-13 09/09/2012, 10/28/2013   Pneumococcal Polysaccharide-23 04/02/2009, 04/16/2009   Tdap 07/15/2019   Zoster, Live 04/16/2009   Zoster, Unspecified 04/16/2009   Pertinent  Health Maintenance Due  Topic Date Due   DEXA SCAN  Never done   INFLUENZA VACCINE  Completed      11/27/2020   10:00 AM 11/27/2020    8:00 PM 12/10/2021   11:47 AM 01/07/2022    9:04 AM 02/06/2022   10:48 AM  Fall Risk  Falls in the past year?   0 0 0  Was there an injury with Fall?   0 0 0  Fall Risk Category Calculator   0 0 0  Fall Risk Category (Retired)   Low Low Low  (RETIRED) Patient Fall Risk Level High fall risk High fall risk Moderate fall risk Moderate fall risk Moderate fall risk  Patient at Risk for Falls Due to   History of fall(s) History of fall(s) History of fall(s)  Fall risk Follow up   Falls evaluation completed Falls evaluation completed Falls evaluation completed   Functional Status Survey:    Vitals:   03/28/22 0849  BP: (!) 118/54  Pulse: 83  Resp: 17  Temp: 97.7 F (36.5 C)  SpO2: 96%  Weight: 179 lb (81.2 kg)  Height: '5\' 4"'$  (1.626 m)   Body mass index is 30.73 kg/m. Physical Exam Vitals  and nursing note reviewed.  Constitutional:      Appearance: Normal appearance.  HENT:     Head: Normocephalic and atraumatic.     Nose: Nose normal.     Mouth/Throat:     Mouth: Mucous membranes are moist.  Eyes:     Extraocular Movements: Extraocular movements intact.     Conjunctiva/sclera: Conjunctivae normal.     Pupils: Pupils are equal, round, and reactive to light.  Cardiovascular:     Rate and Rhythm: Normal rate and regular rhythm.     Heart sounds: No murmur heard.    Comments: Weak dorsalis pedis pulses R+L from previous examination.  Pulmonary:     Effort: Pulmonary effort is normal.     Breath sounds: Rales present. No wheezing or rhonchi.     Comments: Bibasilar rales.  Abdominal:     General: Bowel sounds are normal.     Palpations: Abdomen is soft.     Tenderness: There is no abdominal tenderness.  Musculoskeletal:     Cervical back: Normal range of motion and neck supple.     Right lower leg: Edema present.     Left lower leg: Edema present.     Comments: Trace edema BLE  Skin:    General: Skin is warm and dry.  Neurological:     General: No focal deficit present.     Mental Status: She is alert. Mental status is at baseline.     Motor: No weakness.     Coordination: Coordination normal.     Gait: Gait abnormal.  Psychiatric:        Mood and Affect: Mood normal.        Behavior: Behavior normal.     Labs reviewed: Recent Labs    11/05/21 0000 11/12/21 0000 12/12/21 0000  NA 142 139 145  K 4.1 4.5 4.3  CL 105 102 107  CO2 27* 27* 27*  BUN 25* 57*  29*  CREATININE 1.2* 1.6* 1.3*  CALCIUM 9.3 9.2 9.3   Recent Labs    06/18/21 0000 10/01/21 0000 11/05/21 0000  AST '15 13 15  '$ ALT '12 9 9  '$ ALKPHOS 88 80 89  ALBUMIN 3.8 3.9 3.9   Recent Labs    06/18/21 0000 10/01/21 0000 11/05/21 0000  WBC 7.9 7.4 8.8  NEUTROABS 5,925.00 5,469.00  --   HGB 12.5 12.8 12.6  HCT 37 38 37  PLT 226  --  231   Lab Results  Component Value Date   TSH  2.34 06/22/2020   No results found for: "HGBA1C" No results found for: "CHOL", "HDL", "LDLCALC", "LDLDIRECT", "TRIG", "CHOLHDL"  Significant Diagnostic Results in last 30 days:  No results found.  Assessment/Plan Congestive heart failure (CHF) ([place])  09/30/21 CXR mild congestive heart failure. compensated, CXR 10/25/21 showed CHF. Takes Furosemide, compensated clinically. Echo 06/08/08 EF 60%  CKD (chronic kidney disease) stage 3, GFR 30-59 ml/min (HCC) Bun/creat 29/1.28 12/12/21  Gout No flare ups, on Allopurinol       Hypothyroidism stable, on Levothyroxine. TSH 3.95 06/18/21   Abnormal chest x-[place]  Abnormal CXR, suggested CT chest, not candidate for workup  HTN (hypertension)  blood pressure is controlled on Benazepril, Amlodipine.   Senile dementia ([place])  07/15/19 CT head Mild generalized parenchymal atrophy and chronic small vessel ischemic disease, tolerated Memantine well.       History of stroke Hx of CVA per CT head 08/02/20 Remote infarcts within the a occipital cortices bilaterally again noted. Interval development of remote infarcts within the left parietal lobe and periventricular white matte. On ASA  Generalized osteoarthritis of multiple sites  T5, T8, L3 compression fxs. Takes, Tylenol, Gabapentin for pain, off Tramadol, DDD per CT head/cervical spine 08/02/20   Gait instability Peripheral neuropathy, taking Gabapentin, Vit B12. Gait abnormality, uses walker to ambulate.      Family/ staff Communication: plan of care reviewed with the patient and charge nurse  Labs/tests ordered: none  Time spend 35 minutes

## 2022-04-21 ENCOUNTER — Non-Acute Institutional Stay (SKILLED_NURSING_FACILITY): Payer: Medicare Other | Admitting: Nurse Practitioner

## 2022-04-21 ENCOUNTER — Encounter: Payer: Self-pay | Admitting: Nurse Practitioner

## 2022-04-21 DIAGNOSIS — G459 Transient cerebral ischemic attack, unspecified: Secondary | ICD-10-CM

## 2022-04-21 DIAGNOSIS — G609 Hereditary and idiopathic neuropathy, unspecified: Secondary | ICD-10-CM | POA: Diagnosis not present

## 2022-04-21 DIAGNOSIS — I509 Heart failure, unspecified: Secondary | ICD-10-CM | POA: Diagnosis not present

## 2022-04-21 DIAGNOSIS — F03918 Unspecified dementia, unspecified severity, with other behavioral disturbance: Secondary | ICD-10-CM

## 2022-04-21 DIAGNOSIS — E039 Hypothyroidism, unspecified: Secondary | ICD-10-CM

## 2022-04-21 DIAGNOSIS — M159 Polyosteoarthritis, unspecified: Secondary | ICD-10-CM

## 2022-04-21 DIAGNOSIS — M109 Gout, unspecified: Secondary | ICD-10-CM

## 2022-04-21 DIAGNOSIS — I10 Essential (primary) hypertension: Secondary | ICD-10-CM

## 2022-04-21 DIAGNOSIS — R569 Unspecified convulsions: Secondary | ICD-10-CM

## 2022-04-21 DIAGNOSIS — N1831 Chronic kidney disease, stage 3a: Secondary | ICD-10-CM

## 2022-04-21 NOTE — Progress Notes (Unsigned)
Location:  [place] Room Number: NO/31/A Place of Service:  SNF (31) Provider:  Mani Celestin X, NP  Patient Care Team: Virgie Dad, MD as PCP - General (Internal Medicine) Teryl Gubler X, NP as Nurse Practitioner (Internal Medicine) Virgie Dad, MD as Consulting Physician (Internal Medicine) Virgie Dad, MD (Internal Medicine)  Extended Emergency Contact Information Primary Emergency Contact: Jeannine Kitten of [place] Phone: [phone] Relation: Son Secondary Emergency Contact: Tews,[name] Mobile Phone: [phone] Relation: Son  Code Status:  DNR Goals of care: Advanced Directive information    04/21/2022    3:50 PM  Advanced Directives  Does Patient Have a Medical Advance Directive? Yes  Type of Paramedic of [place];Out of facility DNR (pink MOST or yellow form)  Does patient want to make changes to medical advance directive? No - Patient declined  Copy of [place] in Chart? Yes - validated most recent copy scanned in chart (See row information)  Pre-existing out of facility DNR order (yellow form or pink MOST form) Pink Most/Yellow Form available - Physician notified to receive inpatient order     Chief Complaint  Patient presents with   Medical Management of Chronic Issues    Patient is here for a follow up for chronic conditions    Quality Metric Gaps    Patient is due for Dexa scan and AWV Discuss the need for updated vaccines.    HPI:  Pt is a 89 y.o. female seen today for medical management of chronic diseases.    CHF, 09/30/21 CXR mild congestive heart failure. compensated, CXR 10/25/21 showed CHF. Takes Furosemide, compensated clinically. Echo 06/08/08 EF 60%             Peripheral neuropathy, taking Gabapentin, Vit B12 Gait abnormality, uses walker to ambulate.        Hx of TIA. Allergic to statin. On ASA.  HPOA desired not hospital evaluation. Hgb 12.6  11/05/21 Seizure like activity, likely post fall, off Tramadol, not taking anti seizure meds.  OA: T5, T8, L3 compression fxs. Takes, Tylenol, Gabapentin for pain, off Tramadol, DDD per CT head/cervical spine 08/02/20              Hx of CVA per CT head 08/02/20 Remote infarcts within the a occipital cortices bilaterally again noted. Interval development of remote infarcts within the left parietal lobe and periventricular white matte. On ASA             Dementia, 07/15/19 CT head Mild generalized parenchymal atrophy and chronic small vessel ischemic disease, tolerated Memantine well.                          HTN, blood pressure is controlled on Benazepril, Amlodipine.              CKD Bun/creat 29/1.28 12/12/21             Gout stable, on Allopurinol                   Hypothyroidism, stable, on Levothyroxine. TSH 3.95 06/18/21              Abnormal CXR, suggested CT chest, not candidate for workup     Past Medical History:  Diagnosis Date   Cognitive changes    Dementia ([place])    Depression    Gout    Hypertension    Hypothyroidism  Peripheral neuropathy    History reviewed. No pertinent surgical history.  Allergies  Allergen Reactions   Actonel [Risedronate Sodium]    Actonel [Risedronate] Other (See Comments)    "Allergic," per [facility]   Hct [Hydrochlorothiazide]    Hydrochlorothiazide Other (See Comments)    "Allergic," per [facility]   Lipitor [Atorvastatin Calcium]    Lipitor [Atorvastatin] Other (See Comments)    "Allergic," per [facility]   Neomycin Other (See Comments)    Unknown "been so long ago, a Dermatologist told me"   Neomycin Other (See Comments)    "Allergic," per [facility]   Polysporin [Bacitracin-Polymyxin B]    Polysporin [Bacitracin-Polymyxin B] Other (See Comments)    "Allergic," per [facility]   Prolia [Denosumab]    Prolia [Denosumab] Other (See Comments)   Sulfa Antibiotics    Sulfa Antibiotics Other (See Comments)    "Allergic," per [facility]   Zocor [Simvastatin]    Zocor  [Simvastatin] Other (See Comments)    "Allergic," per [facility]    Outpatient Encounter Medications as of 04/21/2022  Medication Sig   acetaminophen (TYLENOL) 325 MG tablet Take 650 mg by mouth in the morning, at noon, and at bedtime.   allopurinol (ZYLOPRIM) 100 MG tablet Take 100 mg by mouth daily.   amLODipine (NORVASC) 2.5 MG tablet Take 2.5 mg by mouth daily.   aspirin 81 MG chewable tablet Chew 81 mg by mouth daily.   benazepril (LOTENSIN) 10 MG tablet Take 10 mg by mouth in the morning.   Calcium Carb-Cholecalciferol 500-400 MG-UNIT TABS Take 2 tablets by mouth daily.    Cholecalciferol (VITAMIN D) 50 MCG (2000 UT) CAPS Take 2,000 Units by mouth daily.   Coenzyme Q10 (CO Q-10) 100 MG CAPS Take 200 mg by mouth in the morning.   furosemide (LASIX) 20 MG tablet Take 1 tablet (20 mg total) by mouth daily.   gabapentin (NEURONTIN) 100 MG capsule Take 200 mg by mouth at bedtime.   levothyroxine (SYNTHROID) 100 MCG tablet Take 100 mcg by mouth once a week. On Wednesday   levothyroxine (SYNTHROID) 50 MCG tablet Take 50 mcg by mouth daily before breakfast. Sun,Mon,Tue, Thur,Fri,Sat   meclizine (ANTIVERT) 12.5 MG tablet Take 12.5 mg by mouth 2 (two) times daily as needed for dizziness.   memantine (NAMENDA) 10 MG tablet Take 10 mg by mouth 2 (two) times daily.   Omega-3 Fatty Acids (OMEGA III EPA+DHA) 1000 MG CAPS Take 2,000 mg by mouth 2 (two) times daily.   potassium chloride (KLOR-CON M) 10 MEQ tablet Take 1 tablet (10 mEq total) by mouth 2 (two) times daily.   No facility-administered encounter medications on file as of 04/21/2022.    Review of Systems  Constitutional:  Negative for fatigue, fever and unexpected weight change.  HENT:  Positive for hearing loss. Negative for congestion and voice change.   Eyes:  Negative for visual disturbance.  Respiratory:  Negative for cough, shortness of breath and wheezing.        DOE  Cardiovascular:  Positive for leg swelling.  Gastrointestinal:   Negative for abdominal pain and constipation.  Genitourinary:  Negative for dysuria and urgency.  Musculoskeletal:  Positive for arthralgias, back pain and gait problem.       Positional lower  portion of the mid back pain. No c/o neck, right hip, or lower back pain.   Skin:  Negative for color change.  Neurological:  Negative for speech difficulty, weakness and light-headedness.  Psychiatric/Behavioral:  Positive for confusion. Negative for behavioral problems  and sleep disturbance. The patient is not nervous/anxious.     Immunization History  Administered Date(s) Administered   Fluad Quad(high Dose 65+) 01/01/2022   Influenza, High Dose Seasonal PF 12/11/2018, 01/01/2022   Influenza-Unspecified 03/24/2018, 12/21/2019, 12/26/2020   Moderna Covid-19 Vaccine Bivalent Booster 17yr & up 01/09/2022   Moderna SARS-COV2 Booster Vaccination 02/20/2021, 07/26/2021   Moderna Sars-Covid-2 Vaccination 03/12/2019, 04/09/2019, 04/21/2019, 01/17/2020, 08/07/2020   Pneumococcal Conjugate-13 09/09/2012, 10/28/2013   Pneumococcal Polysaccharide-23 04/02/2009, 04/16/2009   Tdap 07/15/2019   Zoster, Live 04/16/2009   Zoster, Unspecified 04/16/2009   Pertinent  Health Maintenance Due  Topic Date Due   DEXA SCAN  Never done   INFLUENZA VACCINE  Completed      11/27/2020    8:00 PM 12/10/2021   11:47 AM 01/07/2022    9:04 AM 02/06/2022   10:48 AM 04/21/2022    3:50 PM  Fall Risk  Falls in the past year?  0 0 0 0  Was there an injury with Fall?  0 0 0 0  Fall Risk Category Calculator  0 0 0 0  Fall Risk Category (Retired)  Low Low Low   (RETIRED) Patient Fall Risk Level High fall risk Moderate fall risk Moderate fall risk Moderate fall risk   Patient at Risk for Falls Due to  History of fall(s) History of fall(s) History of fall(s) No Fall Risks  Fall risk Follow up  Falls evaluation completed Falls evaluation completed Falls evaluation completed Falls evaluation completed   Functional Status  Survey:    Vitals:   04/21/22 1549  BP: (!) 113/56  Pulse: 80  Resp: 17  Temp: (!) 97.5 F (36.4 C)  SpO2: 97%  Weight: 175 lb 8 oz (79.6 kg)  Height: 5' 4"$  (1.626 m)   Body mass index is 30.12 kg/m. Physical Exam Vitals and nursing note reviewed.  Constitutional:      Appearance: Normal appearance.  HENT:     Head: Normocephalic and atraumatic.     Nose: Nose normal.     Mouth/Throat:     Mouth: Mucous membranes are moist.  Eyes:     Extraocular Movements: Extraocular movements intact.     Conjunctiva/sclera: Conjunctivae normal.     Pupils: Pupils are equal, round, and reactive to light.  Cardiovascular:     Rate and Rhythm: Normal rate and regular rhythm.     Heart sounds: No murmur heard.    Comments: Weak dorsalis pedis pulses R+L from previous examination.  Pulmonary:     Effort: Pulmonary effort is normal.     Breath sounds: Rales present. No wheezing or rhonchi.     Comments: Bibasilar rales.  Abdominal:     General: Bowel sounds are normal.     Palpations: Abdomen is soft.     Tenderness: There is no abdominal tenderness.  Musculoskeletal:     Cervical back: Normal range of motion and neck supple.     Right lower leg: Edema present.     Left lower leg: Edema present.     Comments: Trace edema BLE  Skin:    General: Skin is warm and dry.  Neurological:     General: No focal deficit present.     Mental Status: She is alert. Mental status is at baseline.     Motor: No weakness.     Coordination: Coordination normal.     Gait: Gait abnormal.  Psychiatric:        Mood and Affect: Mood normal.  Behavior: Behavior normal.     Labs reviewed: Recent Labs    11/05/21 0000 11/12/21 0000 12/12/21 0000  NA 142 139 145  K 4.1 4.5 4.3  CL 105 102 107  CO2 27* 27* 27*  BUN 25* 57* 29*  CREATININE 1.2* 1.6* 1.3*  CALCIUM 9.3 9.2 9.3   Recent Labs    06/18/21 0000 10/01/21 0000 11/05/21 0000  AST 15 13 15  $ ALT 12 9 9  $ ALKPHOS 88 80 89   ALBUMIN 3.8 3.9 3.9   Recent Labs    06/18/21 0000 10/01/21 0000 11/05/21 0000  WBC 7.9 7.4 8.8  NEUTROABS 5,925.00 5,469.00  --   HGB 12.5 12.8 12.6  HCT 37 38 37  PLT 226  --  231   Lab Results  Component Value Date   TSH 2.34 06/22/2020   No results found for: "HGBA1C" No results found for: "CHOL", "HDL", "LDLCALC", "LDLDIRECT", "TRIG", "CHOLHDL"  Significant Diagnostic Results in last 30 days:  No results found.  Assessment/Plan Peripheral neuropathy  taking Gabapentin, Vit B12, ambulates with walker slowly  Congestive heart failure (CHF) ([place]) 09/30/21 CXR mild congestive heart failure. compensated, CXR 10/25/21 showed CHF. Takes Furosemide, compensated clinically. Echo 06/08/08 EF 60%  TIA (transient ischemic attack) Hx of TIA. Allergic to statin. On ASA.  HPOA desired not hospital evaluation. Hgb 12.6 11/05/21 Hx of CVA per CT head 08/02/20 Remote infarcts within the a occipital cortices bilaterally again noted. Interval development of remote infarcts within the left parietal lobe and periventricular white matte. On ASA  Seizure-like activity (HCC) Seizure like activity, likely post fall, off Tramadol, not taking anti seizure meds.   Generalized osteoarthritis of multiple sites T5, T8, L3 compression fxs. Takes, Tylenol, Gabapentin for pain, off Tramadol, DDD per CT head/cervical spine 08/02/20   Dementia with behavioral disturbance ([place]) 07/15/19 CT head Mild generalized parenchymal atrophy and chronic small vessel ischemic disease, tolerated Memantine well.      HTN (hypertension)  blood pressure is controlled on Benazepril, Amlodipine.   CKD (chronic kidney disease) stage 3, GFR 30-59 ml/min (HCC) Bun/creat 29/1.28 12/12/21  Gout stable, on Allopurinol        Hypothyroidism  stable, on Levothyroxine. TSH 3.95 06/18/21      Family/ staff Communication: plan of care reviewed with the patient and charge nurse.   Labs/tests ordered:  none  Time spend 35  minutes.

## 2022-04-24 ENCOUNTER — Encounter: Payer: Self-pay | Admitting: Nurse Practitioner

## 2022-04-24 NOTE — Assessment & Plan Note (Signed)
T5, T8, L3 compression fxs. Takes, Tylenol, Gabapentin for pain, off Tramadol, DDD per CT head/cervical spine 08/02/20

## 2022-04-24 NOTE — Assessment & Plan Note (Signed)
Seizure like activity, likely post fall, off Tramadol, not taking anti seizure meds.

## 2022-04-24 NOTE — Assessment & Plan Note (Signed)
taking Gabapentin, Vit B12, ambulates with walker slowly

## 2022-04-24 NOTE — Assessment & Plan Note (Signed)
07/15/19 CT head Mild generalized parenchymal atrophy and chronic small vessel ischemic disease, tolerated Memantine well.

## 2022-04-24 NOTE — Assessment & Plan Note (Signed)
Bun/creat 29/1.28 12/12/21

## 2022-04-24 NOTE — Assessment & Plan Note (Signed)
blood pressure is controlled on Benazepril, Amlodipine.

## 2022-04-24 NOTE — Assessment & Plan Note (Addendum)
Hx of TIA. Allergic to statin. On ASA.  HPOA desired not hospital evaluation. Hgb 12.6 11/05/21 Hx of CVA per CT head 08/02/20 Remote infarcts within the a occipital cortices bilaterally again noted. Interval development of remote infarcts within the left parietal lobe and periventricular white matte. On ASA

## 2022-04-24 NOTE — Assessment & Plan Note (Signed)
stable, on Levothyroxine. TSH 3.95 06/18/21

## 2022-04-24 NOTE — Assessment & Plan Note (Signed)
09/30/21 CXR mild congestive heart failure. compensated, CXR 10/25/21 showed CHF. Takes Furosemide, compensated clinically. Echo 06/08/08 EF 60%

## 2022-04-24 NOTE — Assessment & Plan Note (Signed)
stable, on Allopurinol

## 2022-05-06 ENCOUNTER — Non-Acute Institutional Stay (SKILLED_NURSING_FACILITY): Payer: Medicare Other | Admitting: Nurse Practitioner

## 2022-05-06 ENCOUNTER — Encounter: Payer: Self-pay | Admitting: Nurse Practitioner

## 2022-05-06 DIAGNOSIS — I509 Heart failure, unspecified: Secondary | ICD-10-CM

## 2022-05-06 DIAGNOSIS — M109 Gout, unspecified: Secondary | ICD-10-CM

## 2022-05-06 DIAGNOSIS — I10 Essential (primary) hypertension: Secondary | ICD-10-CM | POA: Diagnosis not present

## 2022-05-06 DIAGNOSIS — G609 Hereditary and idiopathic neuropathy, unspecified: Secondary | ICD-10-CM

## 2022-05-06 DIAGNOSIS — J189 Pneumonia, unspecified organism: Secondary | ICD-10-CM | POA: Insufficient documentation

## 2022-05-06 DIAGNOSIS — E039 Hypothyroidism, unspecified: Secondary | ICD-10-CM

## 2022-05-06 LAB — COMPREHENSIVE METABOLIC PANEL
Albumin: 4.3 (ref 3.5–5.0)
Calcium: 9.6 (ref 8.7–10.7)
Globulin: 2.9
eGFR: 26

## 2022-05-06 LAB — HEPATIC FUNCTION PANEL
ALT: 10 U/L (ref 7–35)
AST: 13 (ref 13–35)
Alkaline Phosphatase: 89 (ref 25–125)
Bilirubin, Total: 0.3

## 2022-05-06 LAB — BASIC METABOLIC PANEL
BUN: 32 — AB (ref 4–21)
CO2: 29 — AB (ref 13–22)
Chloride: 101 (ref 99–108)
Creatinine: 1.8 — AB (ref 0.5–1.1)
Glucose: 132
Potassium: 4.4 mEq/L (ref 3.5–5.1)
Sodium: 138 (ref 137–147)

## 2022-05-06 LAB — CBC AND DIFFERENTIAL
HCT: 38 (ref 36–46)
Hemoglobin: 12.8 (ref 12.0–16.0)
Neutrophils Absolute: 7011
Platelets: 227 10*3/uL (ref 150–400)
WBC: 9.5

## 2022-05-06 LAB — CBC: RBC: 3.98 (ref 3.87–5.11)

## 2022-05-06 NOTE — Assessment & Plan Note (Signed)
blood pressure is controlled on Benazepril, Amlodipine.

## 2022-05-06 NOTE — Assessment & Plan Note (Signed)
taking Gabapentin, Vit B12

## 2022-05-06 NOTE — Assessment & Plan Note (Signed)
Noted more edema BLE, increase Furosemide '40mg'$ Alexia Freestone 90mq daily

## 2022-05-06 NOTE — Progress Notes (Signed)
Opened in error, duplicate.

## 2022-05-06 NOTE — Assessment & Plan Note (Signed)
on Allopurinol

## 2022-05-06 NOTE — Assessment & Plan Note (Signed)
stable, on Levothyroxine. TSH 3.95 06/18/21

## 2022-05-06 NOTE — Progress Notes (Addendum)
Location:   SNF [place] Room Number: 71 Place of Service:  SNF (31) Provider: [facility] [place] NP  Virgie Dad, MD  Patient Care Team: Virgie Dad, MD as PCP - General (Internal Medicine) Dellis Voght X, NP as Nurse Practitioner (Internal Medicine) Virgie Dad, MD as Consulting Physician (Internal Medicine) Virgie Dad, MD (Internal Medicine)  Extended Emergency Contact Information Primary Emergency Contact: Jeannine Kitten of [place] Phone: [phone] Relation: Son Secondary Emergency Contact: Cedar,[name] Mobile Phone: [phone] Relation: Son  Code Status: DNR Goals of care: Advanced Directive information    04/21/2022    3:50 PM  Advanced Directives  Does Patient Have a Medical Advance Directive? Yes  Type of Paramedic of [place];Out of facility DNR (pink MOST or yellow form)  Does patient want to make changes to medical advance directive? No - Patient declined  Copy of [place] in Chart? Yes - validated most recent copy scanned in chart (See row information)  Pre-existing out of facility DNR order (yellow form or pink MOST form) Pink Most/Yellow Form available - Physician notified to receive inpatient order     Chief Complaint  Patient presents with   Acute Visit    Wheezing cough.     HPI:  Pt is a 89 y.o. female seen today for an acute visit for wheezing cough, denied chest pain, no O2 desaturation, negative COVID test, she is afebrile.  CHF, 09/30/21 CXR mild congestive heart failure, CXR 10/25/21 showed CHF. Takes Furosemide, compensated clinically. Echo 06/08/08 EF 60%             Peripheral neuropathy, taking Gabapentin, Vit B12 Gait abnormality, uses walker to ambulate.        Hx of TIA. Allergic to statin. On ASA.  HPOA desired not hospital evaluation. Hgb 12.6 11/05/21 Seizure like activity, likely post fall, off Tramadol, not taking anti seizure meds.  OA: T5, T8, L3 compression  fxs. Takes, Tylenol, Gabapentin for pain, off Tramadol, DDD per CT head/cervical spine 08/02/20              Hx of CVA per CT head 08/02/20 Remote infarcts within the a occipital cortices bilaterally again noted. Interval development of remote infarcts within the left parietal lobe and periventricular white matte. On ASA            Dementia, 07/15/19 CT head Mild generalized parenchymal atrophy and chronic small vessel ischemic disease, tolerated Memantine well.                          HTN, blood pressure is controlled on Benazepril, Amlodipine.              CKD Bun/creat 29/1.28 12/12/21             Gout stable, on Allopurinol                   Hypothyroidism, stable, on Levothyroxine. TSH 3.95 06/18/21              Abnormal CXR, suggested CT chest, not candidate for workup   Past Medical History:  Diagnosis Date   Cognitive changes    Dementia ([place])    Depression    Gout    Hypertension    Hypothyroidism    Peripheral neuropathy    History reviewed. No pertinent surgical history.  Allergies  Allergen Reactions   Actonel [Risedronate Sodium]  Actonel [Risedronate] Other (See Comments)    "Allergic," per [facility]   Hct [Hydrochlorothiazide]    Hydrochlorothiazide Other (See Comments)    "Allergic," per [facility]   Lipitor [Atorvastatin Calcium]    Lipitor [Atorvastatin] Other (See Comments)    "Allergic," per [facility]   Neomycin Other (See Comments)    Unknown "been so long ago, a Dermatologist told me"   Neomycin Other (See Comments)    "Allergic," per [facility]   Polysporin [Bacitracin-Polymyxin B]    Polysporin [Bacitracin-Polymyxin B] Other (See Comments)    "Allergic," per MAR   Prolia [Denosumab]    Prolia [Denosumab] Other (See Comments)   Sulfa Antibiotics    Sulfa Antibiotics Other (See Comments)    "Allergic," per [facility]   Zocor [Simvastatin]    Zocor [Simvastatin] Other (See Comments)    "Allergic," per [facility]    Allergies as of 05/06/2022       Reactions   Actonel [risedronate  Sodium]    Actonel [risedronate] Other (See Comments)   "Allergic," per [facility]   Hct [hydrochlorothiazide]    Hydrochlorothiazide Other (See Comments)   "Allergic," per [facility]   Lipitor [atorvastatin Calcium]    Lipitor [atorvastatin] Other (See Comments)   "Allergic," per [facility]   Neomycin Other (See Comments)   Unknown "been so long ago, a Dermatologist told me"   Neomycin Other (See Comments)   "Allergic," per [facility]   Polysporin [bacitracin-polymyxin B]    Polysporin [bacitracin-polymyxin B] Other (See Comments)   "Allergic," per [facility]   Prolia [denosumab]    Prolia [denosumab] Other (See Comments)   Sulfa Antibiotics    Sulfa Antibiotics Other (See Comments)   "Allergic," per [facility]   Zocor [simvastatin]    Zocor [simvastatin] Other (See Comments)   "Allergic," per [facility]        Medication List        Accurate as of May 06, 2022  3:41 PM. If you have any questions, ask your nurse or doctor.          acetaminophen 325 MG tablet Commonly known as: TYLENOL Take 650 mg by mouth in the morning, at noon, and at bedtime.   allopurinol 100 MG tablet Commonly known as: ZYLOPRIM Take 100 mg by mouth daily.   amLODipine 2.5 MG tablet Commonly known as: NORVASC Take 2.5 mg by mouth daily.   aspirin 81 MG chewable tablet Chew 81 mg by mouth daily.   benazepril 10 MG tablet Commonly known as: LOTENSIN Take 10 mg by mouth in the morning.   Calcium Carb-Cholecalciferol 500-400 MG-UNIT Tabs Take 2 tablets by mouth daily.   Co Q-10 100 MG Caps Take 200 mg by mouth in the morning.   furosemide 20 MG tablet Commonly known as: LASIX Take 1 tablet (20 mg total) by mouth daily.   gabapentin 100 MG capsule Commonly known as: NEURONTIN Take 200 mg by mouth at bedtime.   levothyroxine 100 MCG tablet Commonly known as: SYNTHROID Take 100 mcg by mouth once a week. On Wednesday   levothyroxine 50 MCG tablet Commonly known as: SYNTHROID Take 50 mcg by mouth daily before  breakfast. Sun,Mon,Tue, Thur,Fri,Sat   meclizine 12.5 MG tablet Commonly known as: ANTIVERT Take 12.5 mg by mouth 2 (two) times daily as needed for dizziness.   memantine 10 MG tablet Commonly known as: NAMENDA Take 10 mg by mouth 2 (two) times daily.   Omega III EPA+DHA 1000 MG Caps Take 2,000 mg by mouth 2 (two) times daily.   potassium  chloride 10 MEQ tablet Commonly known as: KLOR-CON M Take 1 tablet (10 mEq total) by mouth 2 (two) times daily.   Vitamin D 50 MCG (2000 UT) Caps Take 2,000 Units by mouth daily.        Review of Systems  Constitutional:  Negative for activity change, fever and unexpected weight change.  HENT:  Positive for hearing loss. Negative for congestion and voice change.   Eyes:  Negative for visual disturbance.  Respiratory:  Positive for cough, shortness of breath and wheezing.        DOE  Cardiovascular:  Positive for leg swelling.  Gastrointestinal:  Negative for abdominal pain and constipation.  Genitourinary:  Negative for dysuria and urgency.  Musculoskeletal:  Positive for arthralgias, back pain and gait problem.       Positional lower  portion of the mid back pain. No c/o neck, right hip, or lower back pain.   Skin:  Negative for color change.  Neurological:  Negative for speech difficulty, weakness and light-headedness.  Psychiatric/Behavioral:  Positive for confusion. Negative for behavioral problems and sleep disturbance. The patient is not nervous/anxious.     Immunization History  Administered Date(s) Administered   Fluad Quad(high Dose 65+) 01/01/2022   Influenza, High Dose Seasonal PF 12/11/2018, 01/01/2022   Influenza-Unspecified 03/24/2018, 12/21/2019, 12/26/2020   Moderna Covid-19 Vaccine Bivalent Booster 70yr & up 01/09/2022   Moderna SARS-COV2 Booster Vaccination 02/20/2021, 07/26/2021   Moderna Sars-Covid-2 Vaccination 03/12/2019, 04/09/2019, 04/21/2019, 01/17/2020, 08/07/2020   Pneumococcal Conjugate-13 09/09/2012,  10/28/2013   Pneumococcal Polysaccharide-23 04/02/2009, 04/16/2009   Tdap 07/15/2019   Zoster, Live 04/16/2009   Zoster, Unspecified 04/16/2009   Pertinent  Health Maintenance Due  Topic Date Due   DEXA SCAN  Never done   INFLUENZA VACCINE  Completed      11/27/2020    8:00 PM 12/10/2021   11:47 AM 01/07/2022    9:04 AM 02/06/2022   10:48 AM 04/21/2022    3:50 PM  Fall Risk  Falls in the past year?  0 0 0 0  Was there an injury with Fall?  0 0 0 0  Fall Risk Category Calculator  0 0 0 0  Fall Risk Category (Retired)  Low Low Low   (RETIRED) Patient Fall Risk Level High fall risk Moderate fall risk Moderate fall risk Moderate fall risk   Patient at Risk for Falls Due to  History of fall(s) History of fall(s) History of fall(s) No Fall Risks  Fall risk Follow up  Falls evaluation completed Falls evaluation completed Falls evaluation completed Falls evaluation completed   Functional Status Survey:    Vitals:   05/06/22 1232  BP: 137/64  Pulse: 79  Resp: 17  Temp: (!) 97.5 F (36.4 C)  SpO2: 97%  Weight: 175 lb 8 oz (79.6 kg)   Body mass index is 30.12 kg/m. Physical Exam Vitals and nursing note reviewed.  HENT:     Head: Normocephalic and atraumatic.     Nose: Nose normal.     Mouth/Throat:     Mouth: Mucous membranes are moist.  Eyes:     Extraocular Movements: Extraocular movements intact.     Conjunctiva/sclera: Conjunctivae normal.     Pupils: Pupils are equal, round, and reactive to light.  Cardiovascular:     Rate and Rhythm: Normal rate and regular rhythm.     Heart sounds: No murmur heard.    Comments: Weak dorsalis pedis pulses R+L from previous examination.  Pulmonary:  Effort: Pulmonary effort is normal.     Breath sounds: Wheezing and rales present. No rhonchi.     Comments: Posterior mid to lower lungs moist rales, scattered expiratory wheezes noted.  Abdominal:     General: Bowel sounds are normal.     Palpations: Abdomen is soft.      Tenderness: There is no abdominal tenderness.  Musculoskeletal:     Cervical back: Normal range of motion and neck supple.     Right lower leg: Edema present.     Left lower leg: Edema present.     Comments: 2+ edema BLE  Skin:    General: Skin is warm and dry.  Neurological:     General: No focal deficit present.     Mental Status: She is alert. Mental status is at baseline.     Motor: No weakness.     Coordination: Coordination normal.     Gait: Gait abnormal.  Psychiatric:        Mood and Affect: Mood normal.        Behavior: Behavior normal.     Labs reviewed: Recent Labs    11/05/21 0000 11/12/21 0000 12/12/21 0000  NA 142 139 145  K 4.1 4.5 4.3  CL 105 102 107  CO2 27* 27* 27*  BUN 25* 57* 29*  CREATININE 1.2* 1.6* 1.3*  CALCIUM 9.3 9.2 9.3   Recent Labs    06/18/21 0000 10/01/21 0000 11/05/21 0000  AST '15 13 15  '$ ALT '12 9 9  '$ ALKPHOS 88 80 89  ALBUMIN 3.8 3.9 3.9   Recent Labs    06/18/21 0000 10/01/21 0000 11/05/21 0000  WBC 7.9 7.4 8.8  NEUTROABS 5,925.00 5,469.00  --   HGB 12.5 12.8 12.6  HCT 37 38 37  PLT 226  --  231   Lab Results  Component Value Date   TSH 2.34 06/22/2020   No results found for: "HGBA1C" No results found for: "CHOL", "HDL", "LDLCALC", "LDLDIRECT", "TRIG", "CHOLHDL"  Significant Diagnostic Results in last 30 days:  No results found.  Assessment/Plan: Pneumonia  wheezing cough, denied chest pain, no O2 desaturation, negative COVID test, she is afebrile. Noted moist rales posterior mid to lower lungs, scattered expiratory wheezes, also noted edema BLE 2+,  will obtain CXR ap/lateral, CBC/diff, CMP/eGFR, will increase Furosemide '40mg'$ /Kcl 21mq qd, Mucinex '600mg'$  bid x 5 days, DuoNeb q8hrs x 72hrs, empirical Doxycycline '100mg'$  bid x 7 days.  05/06/22 CXR bilateral pneumonia, no improvement compared to 10/2021 05/05/22 wbc 9.5, Hgb 12.8, plt 227, neutrophils 73.8, Na 138, K 4.4, Bun 32, creat 1.77  Congestive heart failure (CHF)  (HCC) Noted more edema BLE, increase Furosemide '40mg'$ /Kcl 218m daily  Peripheral neuropathy taking Gabapentin, Vit B12  HTN (hypertension)  blood pressure is controlled on Benazepril, Amlodipine.   Gout on Allopurinol   Hypothyroidism stable, on Levothyroxine. TSH 3.95 06/18/21     Family/ staff Communication: plan of care reviewed with the patient and charge nurse.   Labs/tests ordered:  CXR ap/lateral, CBC/diff, CMP/eGFR

## 2022-05-06 NOTE — Assessment & Plan Note (Addendum)
wheezing cough, denied chest pain, no O2 desaturation, negative COVID test, she is afebrile. Noted moist rales posterior mid to lower lungs, scattered expiratory wheezes, also noted edema BLE 2+,  will obtain CXR ap/lateral, CBC/diff, CMP/eGFR, will increase Furosemide '40mg'$ /Kcl 71mq qd, Mucinex '600mg'$  bid x 5 days, DuoNeb q8hrs x 72hrs, empirical Doxycycline '100mg'$  bid x 7 days.  05/06/22 CXR bilateral pneumonia, no improvement compared to 10/2021 05/05/22 wbc 9.5, Hgb 12.8, plt 227, neutrophils 73.8, Na 138, K 4.4, Bun 32, creat 1.77

## 2022-05-13 ENCOUNTER — Non-Acute Institutional Stay (SKILLED_NURSING_FACILITY): Payer: Medicare Other | Admitting: Family Medicine

## 2022-05-13 DIAGNOSIS — G609 Hereditary and idiopathic neuropathy, unspecified: Secondary | ICD-10-CM

## 2022-05-13 DIAGNOSIS — R2681 Unsteadiness on feet: Secondary | ICD-10-CM

## 2022-05-13 DIAGNOSIS — I10 Essential (primary) hypertension: Secondary | ICD-10-CM

## 2022-05-13 DIAGNOSIS — F03918 Unspecified dementia, unspecified severity, with other behavioral disturbance: Secondary | ICD-10-CM

## 2022-05-13 DIAGNOSIS — I509 Heart failure, unspecified: Secondary | ICD-10-CM

## 2022-05-13 NOTE — Progress Notes (Signed)
Provider:  Alain Honey, MD Location:      Place of Service:     PCP: Virgie Dad, MD Patient Care Team: Virgie Dad, MD as PCP - General (Internal Medicine) Mast, Man X, NP as Nurse Practitioner (Internal Medicine) Virgie Dad, MD as Consulting Physician (Internal Medicine) Virgie Dad, MD (Internal Medicine)  Extended Emergency Contact Information Primary Emergency Contact: Jeannine Kitten of [place] Phone: [phone] Relation: Son Secondary Emergency Contact: Hausler,[name] [place] Phone: [phone] Relation: Son  Code Status:  Goals of Care: Advanced Directive information    04/21/2022    3:50 PM  Advanced Directives  Does Patient Have a Medical Advance Directive? Yes  Type of Paramedic of [place];Out of facility DNR (pink MOST or yellow form)  Does patient want to make changes to medical advance directive? No - Patient declined  Copy of [place] in Chart? Yes - validated most recent copy scanned in chart (See row information)  Pre-existing out of facility DNR order (yellow form or pink MOST form) Pink Most/Yellow Form available - Physician notified to receive inpatient order      No chief complaint on file.   HPI: Patient is a 89 y.o. female seen today for medical management of chronic problems including dementia with behavioral disturbance, stage IIIa chronic kidney disease, idiopathic peripheral neuropathy.  Patient was treated last week for bilateral pneumonia; symptoms have resolved.  Heral neuropathy, chronic congestive heart failure. She is on memantine for dementia; no recent behavioral issues She takes gabapentin for neuropathy.  Pressures controlled on benazepril and amlodipine.  There is a history of gait abnormality and she ambulates with a walker  Past Medical History:  Diagnosis Date   Cognitive changes    Dementia ([place])    Depression    Gout    Hypertension     Hypothyroidism    Peripheral neuropathy    No past surgical history on file.  reports that she has an unknown smoking status. She has never used smokeless tobacco. She reports that she does not drink alcohol and does not use drugs. Social History   Socioeconomic History   Marital status: Widowed    Spouse name: Not on file   Number of children: Not on file   Years of education: Not on file   Highest education level: Not on file  Occupational History   Not on file  Tobacco Use   Smoking status: Unknown   Smokeless tobacco: Never  Vaping Use   Vaping Use: Never used  Substance and Sexual Activity   Alcohol use: Never   Drug use: Never   Sexual activity: Not on file  Other Topics Concern   Not on file  Social History Narrative   ** Merged History Encounter **       Social Determinants of Health   Financial Resource Strain: Not on file  Food Insecurity: Not on file  Transportation Needs: Not on file  Physical Activity: Not on file  Stress: Not on file  Social Connections: Not on file  Intimate Partner Violence: Not on file    Functional Status Survey:    No family history on file.  Health Maintenance  Topic Date Due   Zoster Vaccines- Shingrix (1 of 2) 01/07/1977   DEXA SCAN  Never done   Medicare Annual Wellness (AWV)  03/25/2019   COVID-19 Vaccine (9 - 2023-24 season) 06/09/2022 (Originally 03/06/2022)   DTaP/Tdap/Td (2 - Td or Tdap) 07/14/2029  Pneumonia Vaccine 35+ Years old  Completed   INFLUENZA VACCINE  Completed   HPV VACCINES  Aged Out    Allergies  Allergen Reactions   Actonel [Risedronate Sodium]    Actonel [Risedronate] Other (See Comments)    "Allergic," per [facility]   Hct [Hydrochlorothiazide]    Hydrochlorothiazide Other (See Comments)    "Allergic," per [facility]   Lipitor [Atorvastatin Calcium]    Lipitor [Atorvastatin] Other (See Comments)    "Allergic," per [facility]   Neomycin Other (See Comments)    Unknown "been so long ago, a Dermatologist told  me"   Neomycin Other (See Comments)    "Allergic," per [facility]   Polysporin [Bacitracin-Polymyxin B]    Polysporin [Bacitracin-Polymyxin B] Other (See Comments)    "Allergic," per [facility]   Prolia [Denosumab]    Prolia [Denosumab] Other (See Comments)   Sulfa Antibiotics    Sulfa Antibiotics Other (See Comments)    "Allergic," per [facility]   Zocor [Simvastatin]    Zocor [Simvastatin] Other (See Comments)    "Allergic," per [facility]    Outpatient Encounter Medications as of 05/13/2022  Medication Sig   acetaminophen (TYLENOL) 325 MG tablet Take 650 mg by mouth in the morning, at noon, and at bedtime.   allopurinol (ZYLOPRIM) 100 MG tablet Take 100 mg by mouth daily.   amLODipine (NORVASC) 2.5 MG tablet Take 2.5 mg by mouth daily.   aspirin 81 MG chewable tablet Chew 81 mg by mouth daily.   benazepril (LOTENSIN) 10 MG tablet Take 10 mg by mouth in the morning.   Calcium Carb-Cholecalciferol 500-400 MG-UNIT TABS Take 2 tablets by mouth daily.    Cholecalciferol (VITAMIN D) 50 MCG (2000 UT) CAPS Take 2,000 Units by mouth daily.   Coenzyme Q10 (CO Q-10) 100 MG CAPS Take 200 mg by mouth in the morning.   furosemide (LASIX) 20 MG tablet Take 1 tablet (20 mg total) by mouth daily.   gabapentin (NEURONTIN) 100 MG capsule Take 200 mg by mouth at bedtime.   levothyroxine (SYNTHROID) 100 MCG tablet Take 100 mcg by mouth once a week. On Wednesday   levothyroxine (SYNTHROID) 50 MCG tablet Take 50 mcg by mouth daily before breakfast. Sun,Mon,Tue, Thur,Fri,Sat   meclizine (ANTIVERT) 12.5 MG tablet Take 12.5 mg by mouth 2 (two) times daily as needed for dizziness.   memantine (NAMENDA) 10 MG tablet Take 10 mg by mouth 2 (two) times daily.   Omega-3 Fatty Acids (OMEGA III EPA+DHA) 1000 MG CAPS Take 2,000 mg by mouth 2 (two) times daily.   potassium chloride (KLOR-CON M) 10 MEQ tablet Take 1 tablet (10 mEq total) by mouth 2 (two) times daily.   No facility-administered encounter medications on file as of 05/13/2022.     Review of Systems  Constitutional: Negative.   HENT:  Positive for hearing loss.   Eyes: Negative.   Respiratory: Negative.    Cardiovascular:  Positive for leg swelling.  Musculoskeletal:  Positive for gait problem.  Hematological: Negative.   Psychiatric/Behavioral:  Positive for confusion.   All other systems reviewed and are negative.   There were no vitals filed for this visit. There is no height or weight on file to calculate BMI. Physical Exam Vitals and nursing note reviewed.  Constitutional:      Appearance: Normal appearance.  HENT:     Head: Normocephalic and atraumatic.     Mouth/Throat:     Mouth: Mucous membranes are moist.     Pharynx: Oropharynx is clear.  Eyes:  Extraocular Movements: Extraocular movements intact.     Pupils: Pupils are equal, round, and reactive to light.  Cardiovascular:     Rate and Rhythm: Normal rate and regular rhythm.  Pulmonary:     Effort: Pulmonary effort is normal.     Breath sounds: Normal breath sounds.  Abdominal:     General: Bowel sounds are normal.     Palpations: Abdomen is soft.  Musculoskeletal:        General: Normal range of motion.  Skin:    General: Skin is warm.  Neurological:     General: No focal deficit present.     Mental Status: She is alert.  Psychiatric:        Mood and Affect: Mood normal.        Behavior: Behavior normal.     Labs reviewed: Basic Metabolic Panel: Recent Labs    11/05/21 0000 11/12/21 0000 12/12/21 0000  NA 142 139 145  K 4.1 4.5 4.3  CL 105 102 107  CO2 27* 27* 27*  BUN 25* 57* 29*  CREATININE 1.2* 1.6* 1.3*  CALCIUM 9.3 9.2 9.3   Liver Function Tests: Recent Labs    06/18/21 0000 10/01/21 0000 11/05/21 0000  AST '15 13 15  '$ ALT '12 9 9  '$ ALKPHOS 88 80 89  ALBUMIN 3.8 3.9 3.9   No results for input(s): "LIPASE", "AMYLASE" in the last 8760 hours. No results for input(s): "AMMONIA" in the last 8760 hours. CBC: Recent Labs    06/18/21 0000 10/01/21 0000  11/05/21 0000  WBC 7.9 7.4 8.8  NEUTROABS 5,925.00 5,469.00  --   HGB 12.5 12.8 12.6  HCT 37 38 37  PLT 226  --  231   Cardiac Enzymes: No results for input(s): "CKTOTAL", "CKMB", "CKMBINDEX", "TROPONINI" in the last 8760 hours. BNP: Invalid input(s): "POCBNP" No results found for: "HGBA1C" Lab Results  Component Value Date   TSH 2.34 06/22/2020   No results found for: "VITAMINB12" No results found for: "FOLATE" No results found for: "IRON", "TIBC", "FERRITIN"  Imaging and Procedures obtained prior to SNF admission: MR BRAIN WO CONTRAST  Result Date: 11/25/2020 CLINICAL DATA:  TIA EXAM: MRI HEAD WITHOUT CONTRAST MRA HEAD WITHOUT CONTRAST TECHNIQUE: Multiplanar, multi-echo pulse sequences of the brain and surrounding structures were acquired without intravenous contrast. Angiographic images of the Circle of Willis were acquired using MRA technique without intravenous contrast. COMPARISON:  Head CT from yesterday FINDINGS: MRI HEAD FINDINGS Brain: No acute infarction, hemorrhage, hydrocephalus, extra-axial collection or mass lesion. Remote bilateral occipital and left parietal cortically based infarcts. Chronic small vessel ischemia in the deep white matter which is mild for age. Nonspecific cerebral volume loss. Vascular: Major flow voids are preserved Skull and upper cervical spine: Posterior scalp swelling. No evidence of bone lesion. Sinuses/Orbits: Bilateral cataract resection.  No evidence of injury Other: Moderate motion artifact to the degree that findings could be obscured. MRA HEAD FINDINGS Significant motion artifact intermittently, especially at the level of the circle-of-Willis. The covered vertebral, basilar, and carotid arteries are patent. A1, M1, and P1 segments are largely obscured by motion artifact. No detected branch occlusion or aneurysm. IMPRESSION: Brain MRI: 1. No acute intracranial finding.  No acute infarct. 2. Chronic small vessel disease and small remote cortical  infarcts. 3. Posterior scalp contusion. 4. Moderate motion artifact Intracranial MRA: Motion artifact with multiple nondiagnostic slices. Unremarkable vessels where visible. Electronically Signed   By: Jorje Guild M.D.   On: 11/25/2020 06:39   MR ANGIO  HEAD WO CONTRAST  Result Date: 11/25/2020 CLINICAL DATA:  TIA EXAM: MRI HEAD WITHOUT CONTRAST MRA HEAD WITHOUT CONTRAST TECHNIQUE: Multiplanar, multi-echo pulse sequences of the brain and surrounding structures were acquired without intravenous contrast. Angiographic images of the Circle of Willis were acquired using MRA technique without intravenous contrast. COMPARISON:  Head CT from yesterday FINDINGS: MRI HEAD FINDINGS Brain: No acute infarction, hemorrhage, hydrocephalus, extra-axial collection or mass lesion. Remote bilateral occipital and left parietal cortically based infarcts. Chronic small vessel ischemia in the deep white matter which is mild for age. Nonspecific cerebral volume loss. Vascular: Major flow voids are preserved Skull and upper cervical spine: Posterior scalp swelling. No evidence of bone lesion. Sinuses/Orbits: Bilateral cataract resection.  No evidence of injury Other: Moderate motion artifact to the degree that findings could be obscured. MRA HEAD FINDINGS Significant motion artifact intermittently, especially at the level of the circle-of-Willis. The covered vertebral, basilar, and carotid arteries are patent. A1, M1, and P1 segments are largely obscured by motion artifact. No detected branch occlusion or aneurysm. IMPRESSION: Brain MRI: 1. No acute intracranial finding.  No acute infarct. 2. Chronic small vessel disease and small remote cortical infarcts. 3. Posterior scalp contusion. 4. Moderate motion artifact Intracranial MRA: Motion artifact with multiple nondiagnostic slices. Unremarkable vessels where visible. Electronically Signed   By: Jorje Guild M.D.   On: 11/25/2020 06:39   DG Pelvis Portable  Result Date:  11/25/2020 CLINICAL DATA:  Fall at home. EXAM: PORTABLE PELVIS 1-2 VIEWS COMPARISON:  None. FINDINGS: There is no evidence of pelvic fracture or diastasis. No pelvic bone lesions are seen. Atherosclerosis and osteopenia. IMPRESSION: No acute finding Electronically Signed   By: Jorje Guild M.D.   On: 11/25/2020 04:11   DG Abd Portable 1V  Result Date: 11/25/2020 CLINICAL DATA:  Fall at home EXAM: PORTABLE ABDOMEN - 1 VIEW COMPARISON:  None. FINDINGS: Normal bowel gas pattern. No concerning mass effect or calcification. Generalized osteopenia and lumbar spine degeneration. L1 and L2 wedging, chronic at L2 when compared to a 2011 abdominal CT. Given the degree of T12-L1 spurring, L1 wedging is likely also chronic. IMPRESSION: Normal bowel gas pattern. Electronically Signed   By: Jorje Guild M.D.   On: 11/25/2020 04:11   EEG adult  Result Date: 11/24/2020 Derek Jack, MD     11/24/2020  8:40 PM Routine EEG Report PEITYN MEDICO is a 89 y.o. female with a history of spell who is undergoing an EEG to evaluate for seizures. Report: This EEG was acquired with electrodes placed according to the International 10-20 electrode system (including Fp1, Fp2, F3, F4, C3, C4, P3, P4, O1, O2, T3, T4, T5, T6, A1, A2, Fz, Cz, Pz). The following electrodes were missing or displaced: none. There was no waking occipital dominant rhythm. The best background was 6-7 Hz. This activity is reactive to stimulation. Sleep was identified by K complexes and sleep spindles. There was no focal slowing. There were no definitive interictal epileptiform discharges. There were no electrographic seizures identified. Photic stimulation and hyperventilation were not performed. Impression and clinical correlation: This EEG was obtained while asleep and is abnormal due to mild-to-moderate diffuse slowing. There were no electrographic seizures or definitive epileptiform abnormalities seen during this recording. Su Monks, MD [facility] [phone] If 7pm- 7am, please page neurology on call as listed in [place].   CT HEAD WO CONTRAST  Result Date: 11/24/2020 CLINICAL DATA:  Altered mental status, fall EXAM: CT HEAD WITHOUT CONTRAST TECHNIQUE: Contiguous axial images  were obtained from the base of the skull through the vertex without intravenous contrast. COMPARISON:  CT head 08/02/2020 FINDINGS: Brain: There is no evidence of acute intracranial hemorrhage, extra-axial fluid collection, or acute infarct. A remote infarct in the left parietal lobe is unchanged. Mild parenchymal volume loss and chronic white matter microangiopathy are unchanged. The ventricles are stable in size. There is no mass lesion. There is no midline shift. Vascular: There is calcification of the bilateral cavernous ICAs. Skull: Normal. Negative for fracture or focal lesion. Sinuses/Orbits: The paranasal sinuses are clear. The globes and orbits are unremarkable. Other: None. IMPRESSION: 1. No acute intracranial pathology. 2. Unchanged remote left parietal lobe infarct, parenchymal volume loss, and chronic white matter microangiopathy. Electronically Signed   By: Valetta Mole M.D.   On: 11/24/2020 14:29   CT CERVICAL SPINE WO CONTRAST  Result Date: 11/24/2020 CLINICAL DATA:  Trauma, fall EXAM: CT CERVICAL SPINE WITHOUT CONTRAST TECHNIQUE: Multidetector CT imaging of the cervical spine was performed without intravenous contrast. Multiplanar CT image reconstructions were also generated. COMPARISON:  CT cervical spine 08/02/2020 FINDINGS: Alignment: There is straightened curvature of the cervical spine with slight focal kyphosis centered at C5, similar to the prior study. There is grade 1 anterolisthesis of C3 on C4 and C4 on C5, also unchanged and likely degenerative in nature. There is no evidence of traumatic malalignment. There is no jumped or perched facet. Skull base and vertebrae: Skull base alignment is maintained. Vertebral body heights are  preserved. There is no evidence of acute fracture. Soft tissues and spinal canal: No prevertebral fluid or swelling. No visible canal hematoma. Disc levels: There is marked intervertebral disc space narrowing at C5-C6, similar to the prior study. Degenerative endplate changes also most advanced at this level. There is multilevel facet arthropathy, similar to the prior study. There is mild narrowing of the craniocervical junction. The osseous spinal canal is otherwise patent. Upper chest: There is mosaic attenuation in the lung apices with mild smooth interlobular septal thickening. Other: The left thyroid lobe is surgically absent. The soft tissues are otherwise unremarkable. IMPRESSION: 1. No acute fracture or traumatic malalignment of the cervical spine. 2. Multilevel degenerative changes as above, similar to the prior study. 3. Smooth interlobular septal thickening in the lung apices can be seen with pulmonary interstitial edema. Mosaic attenuation in the lung apices suggests air trapping/small airway disease. Electronically Signed   By: Valetta Mole M.D.   On: 11/24/2020 14:22   DG Chest Portable 1 View  Result Date: 11/24/2020 CLINICAL DATA:  Syncope. EXAM: PORTABLE CHEST 1 VIEW COMPARISON:  Jul 15, 2019 FINDINGS: Stable cardiomegaly. The hila and mediastinum are unremarkable. No pneumothorax. No overt edema or focal infiltrate. Nodular density projected over the right base is favored to represent confluence of shadows or atelectasis. No other acute abnormalities. IMPRESSION: A nodular density projected over the right base is favored to represent confluence of shadows or atelectasis. Recommend a PA and lateral chest x-Ciani before discharge. No acute abnormalities are seen. Electronically Signed   By: Dorise Bullion III M.D.   On: 11/24/2020 13:45    Assessment/Plan  1. Chronic congestive heart failure, unspecified heart failure type (HCC) On Lasix 20 mg a day  2. Dementia with behavioral disturbance  (HCC) On memantine 10 mg twice daily' recent issues  3. Gait instability Uses walker for ambulation  4. Primary hypertension Blood pressure 150/78 on benazepril 10 mg amlodipine 2.5 mg  5. Idiopathic peripheral neuropathy Takes only 200 mg at  bedtime     Family/ staff Communication:   Labs/tests ordered:  Lillette Boxer. Sabra Heck, [place] [address] [place], [place] Office [phone]

## 2022-05-20 ENCOUNTER — Other Ambulatory Visit: Payer: Self-pay | Admitting: Nurse Practitioner

## 2022-06-05 LAB — BASIC METABOLIC PANEL
BUN: 35 — AB (ref 4–21)
BUN: 35 — AB (ref 4–21)
CO2: 26 — AB (ref 13–22)
CO2: 26 — AB (ref 13–22)
Chloride: 105 (ref 99–108)
Chloride: 106 (ref 99–108)
Creatinine: 1.5 — AB (ref 0.5–1.1)
Creatinine: 1.5 — AB (ref 0.5–1.1)
Glucose: 73
Glucose: 73
Potassium: 4 mEq/L (ref 3.5–5.1)
Potassium: 4 mEq/L (ref 3.5–5.1)
Sodium: 139 (ref 137–147)
Sodium: 139 (ref 137–147)

## 2022-06-05 LAB — COMPREHENSIVE METABOLIC PANEL
Albumin: 3.4 — AB (ref 3.5–5.0)
Calcium: 9 (ref 8.7–10.7)

## 2022-06-05 LAB — HEPATIC FUNCTION PANEL: Alkaline Phosphatase: 3.9 — AB (ref 25–125)

## 2022-06-09 ENCOUNTER — Non-Acute Institutional Stay (SKILLED_NURSING_FACILITY): Payer: Medicare Other | Admitting: Nurse Practitioner

## 2022-06-09 ENCOUNTER — Encounter: Payer: Self-pay | Admitting: Nurse Practitioner

## 2022-06-09 DIAGNOSIS — Z8673 Personal history of transient ischemic attack (TIA), and cerebral infarction without residual deficits: Secondary | ICD-10-CM

## 2022-06-09 DIAGNOSIS — M109 Gout, unspecified: Secondary | ICD-10-CM

## 2022-06-09 DIAGNOSIS — I10 Essential (primary) hypertension: Secondary | ICD-10-CM | POA: Diagnosis not present

## 2022-06-09 DIAGNOSIS — F03918 Unspecified dementia, unspecified severity, with other behavioral disturbance: Secondary | ICD-10-CM | POA: Diagnosis not present

## 2022-06-09 DIAGNOSIS — I509 Heart failure, unspecified: Secondary | ICD-10-CM

## 2022-06-09 DIAGNOSIS — M159 Polyosteoarthritis, unspecified: Secondary | ICD-10-CM

## 2022-06-09 DIAGNOSIS — E039 Hypothyroidism, unspecified: Secondary | ICD-10-CM

## 2022-06-09 DIAGNOSIS — R569 Unspecified convulsions: Secondary | ICD-10-CM

## 2022-06-09 DIAGNOSIS — N1831 Chronic kidney disease, stage 3a: Secondary | ICD-10-CM | POA: Diagnosis not present

## 2022-06-09 NOTE — Assessment & Plan Note (Signed)
Bun/creat 35/1.45 06/05/22

## 2022-06-09 NOTE — Assessment & Plan Note (Addendum)
CHF, 09/30/21 CXR mild congestive heart failure, CXR 10/25/21 showed CHF. Takes Furosemide, compensated clinically. Echo 06/08/08 EF 60%, DOE/cough on and off 06/10/22 CXR ap/lateral appears more DOE and hacking cough, feels weak, fell when walking with walker 2/2 legs gave away.

## 2022-06-09 NOTE — Assessment & Plan Note (Signed)
on Allopurinol  

## 2022-06-09 NOTE — Assessment & Plan Note (Signed)
Seizure like activity, likely post fall, off Tramadol, not taking anti seizure meds.  

## 2022-06-09 NOTE — Assessment & Plan Note (Signed)
stable, on Levothyroxine. TSH 3.95 06/18/21  

## 2022-06-09 NOTE — Assessment & Plan Note (Signed)
Hx of CVA per CT head 08/02/20 Remote infarcts within the a occipital cortices bilaterally again noted. Interval development of remote infarcts within the left parietal lobe and periventricular white matte. On ASA 

## 2022-06-09 NOTE — Progress Notes (Unsigned)
Location:   [facility] [place] Room Number: [place] of Service:  SNF (31) Provider: Lennie Odor Devante Capano NP  Virgie Dad, MD  Patient Care Team: Virgie Dad, MD as PCP - General (Internal Medicine) Valbona Slabach X, NP as Nurse Practitioner (Internal Medicine) Virgie Dad, MD as Consulting Physician (Internal Medicine) Virgie Dad, MD (Internal Medicine)  Extended Emergency Contact Information Primary Emergency Contact: Jeannine Kitten of [place] Phone: [phone] Relation: Son Secondary Emergency Contact: Bartmess,[name] [place] Phone: [phone] Relation: Son  Code Status:  DNR Goals of care: Advanced Directive information    06/09/2022    3:19 PM  Advanced Directives  Does Patient Have a Medical Advance Directive? Yes  Type of Paramedic of [place];Out of facility DNR (pink MOST or yellow form)  Does patient want to make changes to medical advance directive? No - Patient declined  Copy of [place] in Chart? Yes - validated most recent copy scanned in chart (See row information)  Pre-existing out of facility DNR order (yellow form or pink MOST form) Pink Most/Yellow Form available - Physician notified to receive inpatient order     Chief Complaint  Patient presents with   Medical Management of Chronic Issues    Patient is being seen for a routine visit    Immunizations    Patient is due for a shingles vaccine   Quality Metric Gaps    Discussed the need for Bone density and AWV    HPI:  Pt is a 89 y.o. female seen today for medical management of chronic diseases.     CHF, 09/30/21 CXR mild congestive heart failure, CXR 10/25/21 showed CHF. Takes Furosemide, compensated clinically. Echo 06/08/08 EF 60%, DOE/cough on and off.             Peripheral neuropathy, taking Gabapentin, Vit B12 Gait abnormality, uses walker to ambulate slowly.       Hx of TIA. Allergic to statin. On ASA.  HPOA desired not hospital  evaluation. Seizure like activity, likely post fall, off Tramadol, not taking anti seizure meds.  OA: T5, T8, L3 compression fxs. Takes, Tylenol, Gabapentin for pain, off Tramadol, DDD per CT head/cervical spine 08/02/20              Hx of CVA per CT head 08/02/20 Remote infarcts within the a occipital cortices bilaterally again noted. Interval development of remote infarcts within the left parietal lobe and periventricular white matte. On ASA            Dementia, 07/15/19 CT head Mild generalized parenchymal atrophy and chronic small vessel ischemic disease, tolerated Memantine well.                          HTN, blood pressure is controlled on Benazepril, Amlodipine.              CKD Bun/creat 35/1.45 06/05/22             Gout stable, on Allopurinol                   Hypothyroidism, stable, on Levothyroxine. TSH 3.95 06/18/21              Abnormal CXR, suggested CT chest, not candidate for workup    Past Medical History:  Diagnosis Date   Cognitive changes    Dementia    Depression    Gout    Hypertension  Hypothyroidism    Peripheral neuropathy    No past surgical history on file.  Allergies  Allergen Reactions   Actonel [Risedronate Sodium]    Actonel [Risedronate] Other (See Comments)    "Allergic," per [facility]   Hct [Hydrochlorothiazide]    Hydrochlorothiazide Other (See Comments)    "Allergic," per [facility]   Lipitor [Atorvastatin Calcium]    Lipitor [Atorvastatin] Other (See Comments)    "Allergic," per [facility]   Neomycin Other (See Comments)    Unknown "been so long ago, a Dermatologist told me"   Neomycin Other (See Comments)    "Allergic," per [name]   Polysporin [Bacitracin-Polymyxin B]    Polysporin [Bacitracin-Polymyxin B] Other (See Comments)    "Allergic," per [facility]   Prolia [Denosumab]    Prolia [Denosumab] Other (See Comments)   Sulfa Antibiotics    Sulfa Antibiotics Other (See Comments)    "Allergic," per [facility]   Zocor [Simvastatin]    Zocor [Simvastatin] Other (See  Comments)    "Allergic," per [facility]    Allergies as of 06/09/2022       Reactions   Actonel [risedronate Sodium]    Actonel [risedronate] Other (See Comments)   "Allergic," per [facility]   Hct [hydrochlorothiazide]    Hydrochlorothiazide Other (See Comments)   "Allergic," per [facility]   Lipitor [atorvastatin Calcium]    Lipitor [atorvastatin] Other (See Comments)   "Allergic," per [facility]   Neomycin Other (See Comments)   Unknown "been so long ago, a Dermatologist told me"   Neomycin Other (See Comments)   "Allergic," per [facility]   Polysporin [bacitracin-polymyxin B]    Polysporin [bacitracin-polymyxin B] Other (See Comments)   "Allergic," per [facility]   Prolia [denosumab]    Prolia [denosumab] Other (See Comments)   Sulfa Antibiotics    Sulfa Antibiotics Other (See Comments)   "Allergic," per [facility]   Zocor [simvastatin]    Zocor [simvastatin] Other (See Comments)   "Allergic," per [facility]        Medication List        Accurate as of June 09, 2022 11:59 PM. If you have any questions, ask your nurse or doctor.          acetaminophen 325 MG tablet Commonly known as: TYLENOL Take 650 mg by mouth in the morning, at noon, and at bedtime.   allopurinol 100 MG tablet Commonly known as: ZYLOPRIM Take 100 mg by mouth daily.   amLODipine 2.5 MG tablet Commonly known as: NORVASC Take 2.5 mg by mouth daily.   aspirin 81 MG chewable tablet Chew 81 mg by mouth daily.   benazepril 10 MG tablet Commonly known as: LOTENSIN Take 10 mg by mouth in the morning.   Calcium Carb-Cholecalciferol 500-400 MG-UNIT Tabs Take 2 tablets by mouth daily.   Co Q-10 100 MG Caps Take 200 mg by mouth in the morning.   furosemide 20 MG tablet Commonly known as: LASIX Take 1 tablet (20 mg total) by mouth daily.   gabapentin 100 MG capsule Commonly known as: NEURONTIN Take 200 mg by mouth at bedtime.   levothyroxine 100 MCG tablet Commonly known as: SYNTHROID Take 100 mcg by mouth once a week. On Wednesday    levothyroxine 50 MCG tablet Commonly known as: SYNTHROID Take 50 mcg by mouth daily before breakfast. Sun,Mon,Tue, Thur,Fri,Sat   memantine 10 MG tablet Commonly known as: NAMENDA Take 10 mg by mouth 2 (two) times daily.   Omega III EPA+DHA 1000 MG Caps Take 2,000 mg by mouth 2 (two) times  daily.   potassium chloride 10 MEQ tablet Commonly known as: KLOR-CON M Take 1 tablet (10 mEq total) by mouth 2 (two) times daily.   Vitamin D 50 MCG (2000 UT) Caps Take 2,000 Units by mouth daily.        Review of Systems  Constitutional:  Positive for fatigue. Negative for activity change and fever.  HENT:  Positive for hearing loss. Negative for congestion and voice change.   Eyes:  Negative for visual disturbance.  Respiratory:  Positive for cough and shortness of breath. Negative for wheezing.        DOE  Cardiovascular:  Positive for leg swelling.  Gastrointestinal:  Negative for abdominal pain and constipation.  Genitourinary:  Negative for dysuria and urgency.  Musculoskeletal:  Positive for arthralgias, back pain and gait problem.       Positional lower  portion of the mid back pain. No c/o neck, right hip, or lower back pain.   Skin:  Negative for color change.  Neurological:  Negative for speech difficulty, weakness and light-headedness.  Psychiatric/Behavioral:  Positive for confusion. Negative for behavioral problems and sleep disturbance. The patient is not nervous/anxious.     Immunization History  Administered Date(s) Administered   Fluad Quad(high Dose 65+) 01/01/2022   Influenza, High Dose Seasonal PF 12/11/2018, 01/01/2022   Influenza-Unspecified 03/24/2018, 12/21/2019, 12/26/2020   Moderna Covid-19 Vaccine Bivalent Booster 35yrs & up 01/09/2022   Moderna SARS-COV2 Booster Vaccination 02/20/2021, 07/26/2021   Moderna Sars-Covid-2 Vaccination 03/12/2019, 04/09/2019, 04/21/2019, 01/17/2020, 08/07/2020   Pneumococcal Conjugate-13 09/09/2012, 10/28/2013   Pneumococcal  Polysaccharide-23 04/02/2009, 04/16/2009   Tdap 07/15/2019   Zoster, Live 04/16/2009   Zoster, Unspecified 04/16/2009   Pertinent  Health Maintenance Due  Topic Date Due   DEXA SCAN  Never done   INFLUENZA VACCINE  10/09/2022      11/27/2020    8:00 PM 12/10/2021   11:47 AM 01/07/2022    9:04 AM 02/06/2022   10:48 AM 04/21/2022    3:50 PM  Fall Risk  Falls in the past year?  0 0 0 0  Was there an injury with Fall?  0 0 0 0  Fall Risk Category Calculator  0 0 0 0  Fall Risk Category (Retired)  Low Low Low   (RETIRED) Patient Fall Risk Level High fall risk Moderate fall risk Moderate fall risk Moderate fall risk   Patient at Risk for Falls Due to  History of fall(s) History of fall(s) History of fall(s) No Fall Risks  Fall risk Follow up  Falls evaluation completed Falls evaluation completed Falls evaluation completed Falls evaluation completed   Functional Status Survey:    Vitals:   06/09/22 1506  BP: 126/60  Pulse: 85  Resp: 18  Temp: (!) 97.5 F (36.4 C)  TempSrc: Temporal  SpO2: 93%  Weight: 172 lb (78 kg)  Height: 5\' 4"  (1.626 m)   Body mass index is 29.52 kg/m. Physical Exam Vitals and nursing note reviewed.  HENT:     Head: Normocephalic and atraumatic.     Nose: Nose normal.     Mouth/Throat:     Mouth: Mucous membranes are moist.  Eyes:     Extraocular Movements: Extraocular movements intact.     Conjunctiva/sclera: Conjunctivae normal.     Pupils: Pupils are equal, round, and reactive to light.  Cardiovascular:     Rate and Rhythm: Normal rate and regular rhythm.     Heart sounds: No murmur heard.    Comments: Weak  dorsalis pedis pulses R+L from previous examination.  Pulmonary:     Effort: Pulmonary effort is normal.     Breath sounds: Rales present. No wheezing or rhonchi.     Comments: Bibasilar rales.  Abdominal:     General: Bowel sounds are normal.     Palpations: Abdomen is soft.     Tenderness: There is no abdominal tenderness.   Musculoskeletal:     Cervical back: Normal range of motion and neck supple.     Right lower leg: Edema present.     Left lower leg: Edema present.     Comments: 1+ edema BLE  Skin:    General: Skin is warm and dry.  Neurological:     General: No focal deficit present.     Mental Status: She is alert. Mental status is at baseline.     Motor: No weakness.     Coordination: Coordination normal.     Gait: Gait abnormal.  Psychiatric:        Mood and Affect: Mood normal.        Behavior: Behavior normal.      Labs reviewed: Recent Labs    11/05/21 0000 11/12/21 0000 12/12/21 0000  NA 142 139 145  K 4.1 4.5 4.3  CL 105 102 107  CO2 27* 27* 27*  BUN 25* 57* 29*  CREATININE 1.2* 1.6* 1.3*  CALCIUM 9.3 9.2 9.3   Recent Labs    06/18/21 0000 10/01/21 0000 11/05/21 0000  AST 15 13 15   ALT 12 9 9   ALKPHOS 88 80 89  ALBUMIN 3.8 3.9 3.9   Recent Labs    06/18/21 0000 10/01/21 0000 11/05/21 0000  WBC 7.9 7.4 8.8  NEUTROABS 5,925.00 5,469.00  --   HGB 12.5 12.8 12.6  HCT 37 38 37  PLT 226  --  231   Lab Results  Component Value Date   TSH 2.34 06/22/2020   No results found for: "HGBA1C" No results found for: "CHOL", "HDL", "LDLCALC", "LDLDIRECT", "TRIG", "CHOLHDL"  Significant Diagnostic Results in last 30 days:  No results found.  Assessment/Plan  History of CVA (cerebrovascular accident) Hx of CVA per CT head 08/02/20 Remote infarcts within the a occipital cortices bilaterally again noted. Interval development of remote infarcts within the left parietal lobe and periventricular white matte. On ASA  Dementia with behavioral disturbance ([place]) 07/15/19 CT head Mild generalized parenchymal atrophy and chronic small vessel ischemic disease, tolerated Memantine well.            HTN (hypertension)  blood pressure is controlled on Benazepril, Amlodipine.   CKD (chronic kidney disease) stage 3, GFR 30-59 ml/min (HCC) Bun/creat 35/1.45 06/05/22  Gout on  Allopurinol        Hypothyroidism stable, on Levothyroxine. TSH 3.95 06/18/21   Generalized osteoarthritis of multiple sites OA: T5, T8, L3 compression fxs. Takes, Tylenol, Gabapentin for pain, off Tramadol, DDD per CT head/cervical spine 08/02/20   Seizure-like activity (HCC) Seizure like activity, likely post fall, off Tramadol, not taking anti seizure meds.   Congestive heart failure (CHF) ([place]) CHF, 09/30/21 CXR mild congestive heart failure, CXR 10/25/21 showed CHF. Takes Furosemide, compensated clinically. Echo 06/08/08 EF 60%, DOE/cough on and off 06/10/22 CXR ap/lateral appears more DOE and hacking cough, feels weak, fell when walking with walker 2/2 legs gave away.    Family/ staff Communication: plan of care reviewed with the patient and charge nurse.   Labs/tests ordered: CXR ap/lateral  Time spend 35 minutes.

## 2022-06-09 NOTE — Assessment & Plan Note (Signed)
OA: T5, T8, L3 compression fxs. Takes, Tylenol, Gabapentin for pain, off Tramadol, DDD per CT head/cervical spine 08/02/20

## 2022-06-09 NOTE — Assessment & Plan Note (Signed)
07/15/19 CT head Mild generalized parenchymal atrophy and chronic small vessel ischemic disease, tolerated Memantine well.      

## 2022-06-09 NOTE — Assessment & Plan Note (Signed)
blood pressure is controlled on Benazepril, Amlodipine.  

## 2022-06-10 ENCOUNTER — Encounter: Payer: Self-pay | Admitting: Nurse Practitioner

## 2022-06-10 NOTE — Progress Notes (Signed)
This encounter was created in error - please disregard.

## 2022-06-12 ENCOUNTER — Non-Acute Institutional Stay (INDEPENDENT_AMBULATORY_CARE_PROVIDER_SITE_OTHER): Payer: Medicare Other | Admitting: Nurse Practitioner

## 2022-06-12 ENCOUNTER — Encounter: Payer: Self-pay | Admitting: Nurse Practitioner

## 2022-06-12 DIAGNOSIS — Z0000 Encounter for general adult medical examination without abnormal findings: Secondary | ICD-10-CM

## 2022-06-12 NOTE — Progress Notes (Signed)
Subjective:   [name] is a 89 y.o. female who presents for Medicare Annual (Subsequent) preventive examination @ SN[facility].        Objective:    Today's Vitals   06/12/22 1007 06/12/22 1255  BP: 120/80   Pulse: 74   Resp: 18   Temp: 98.9 F (37.2 C)   SpO2: 93%   Weight: 172 lb (78 kg)   Height: 5\' 4"  (1.626 m)   PainSc:  2    Body mass index is 29.52 kg/m.     06/12/2022   10:15 AM 06/09/2022    3:19 PM 04/21/2022    3:50 PM 03/28/2022    9:06 AM 02/06/2022   10:47 AM 01/07/2022    9:04 AM 12/10/2021   11:47 AM  Advanced Directives  Does Patient Have a Medical Advance Directive? Yes Yes Yes Yes Yes Yes Yes  Type of Paramedic of [place];Out of facility DNR (pink MOST or yellow form) [place];Out of facility DNR (pink MOST or yellow form) [place];Out of facility DNR (pink MOST or yellow form) [place];Out of facility DNR (pink MOST or yellow form) [place];Out of facility DNR (pink MOST or yellow form) [place];Out of facility DNR (pink MOST or yellow form) [place];Out of facility DNR (pink MOST or yellow form)  Does patient want to make changes to medical advance directive? No - Patient declined No - Patient declined No - Patient declined No - Patient declined No - Patient declined No - Patient declined No - Patient declined  Copy of [place] in Chart? Yes - validated most recent copy scanned in chart (See row information) Yes - validated most recent copy scanned in chart (See row information) Yes - validated most recent copy scanned in chart (See row information) Yes - validated most recent copy scanned in chart (See row information) Yes - validated most recent copy scanned in chart (See row information) Yes - validated most recent copy scanned in chart (See row information) Yes - validated most  recent copy scanned in chart (See row information)  Pre-existing out of facility DNR order (yellow form or pink MOST form) Pink MOST/Yellow Form most recent copy in chart - Physician notified to receive inpatient order Pink Most/Yellow Form available - Physician notified to receive inpatient order Pink Most/Yellow Form available - Physician notified to receive inpatient order Pink Most/Yellow Form available - Physician notified to receive inpatient order Pink MOST form placed in chart (order not valid for inpatient use) Pink MOST form placed in chart (order not valid for inpatient use) Pink MOST form placed in chart (order not valid for inpatient use)    Current Medications (verified) Outpatient Encounter Medications as of 06/12/2022  Medication Sig   acetaminophen (TYLENOL) 325 MG tablet Take 650 mg by mouth in the morning, at noon, and at bedtime.   allopurinol (ZYLOPRIM) 100 MG tablet Take 100 mg by mouth daily.   amLODipine (NORVASC) 2.5 MG tablet Take 2.5 mg by mouth daily.   aspirin 81 MG chewable tablet Chew 81 mg by mouth daily.   benazepril (LOTENSIN) 10 MG tablet Take 10 mg by mouth in the morning.   Calcium Carb-Cholecalciferol 500-400 MG-UNIT TABS Take 2 tablets by mouth daily.    Cholecalciferol (VITAMIN D) 50 MCG (2000 UT) CAPS Take 2,000 Units by mouth daily.   Coenzyme Q10 (CO Q-10)  100 MG CAPS Take 200 mg by mouth in the morning.   furosemide (LASIX) 20 MG tablet Take 1 tablet (20 mg total) by mouth daily.   gabapentin (NEURONTIN) 100 MG capsule Take 200 mg by mouth at bedtime.   levothyroxine (SYNTHROID) 100 MCG tablet Take 100 mcg by mouth once a week. On Wednesday   levothyroxine (SYNTHROID) 50 MCG tablet Take 50 mcg by mouth daily before breakfast. Sun,Mon,Tue, Thur,Fri,Sat   memantine (NAMENDA) 10 MG tablet Take 10 mg by mouth 2 (two) times daily.   Omega-3 Fatty Acids (OMEGA III EPA+DHA) 1000 MG CAPS Take 2,000 mg by mouth 2 (two) times daily.   potassium chloride (KLOR-CON  M) 10 MEQ tablet Take 1 tablet (10 mEq total) by mouth 2 (two) times daily.   No facility-administered encounter medications on file as of 06/12/2022.    Allergies (verified) Actonel [risedronate sodium], Actonel [risedronate], Hct [hydrochlorothiazide], Hydrochlorothiazide, Lipitor [atorvastatin calcium], Lipitor [atorvastatin], Neomycin, Neomycin, Polysporin [bacitracin-polymyxin b], Polysporin [bacitracin-polymyxin b], Prolia [denosumab], Prolia [denosumab], Sulfa antibiotics, Sulfa antibiotics, Zocor [simvastatin], and Zocor [simvastatin]   History: Past Medical History:  Diagnosis Date   Cognitive changes    Dementia    Depression    Gout    Hypertension    Hypothyroidism    Peripheral neuropathy    History reviewed. No pertinent surgical history. History reviewed. No pertinent family history. Social History   Socioeconomic History   Marital status: Widowed    Spouse name: Not on file   Number of children: Not on file   Years of education: Not on file   Highest education level: Not on file  Occupational History   Not on file  Tobacco Use   Smoking status: Unknown   Smokeless tobacco: Never  Vaping Use   Vaping Use: Never used  Substance and Sexual Activity   Alcohol use: Never   Drug use: Never   Sexual activity: Not on file  Other Topics Concern   Not on file  Social History Narrative   ** Merged History Encounter **       Social Determinants of Health   Financial Resource Strain: Not on file  Food Insecurity: Not on file  Transportation Needs: Not on file  Physical Activity: Not on file  Stress: Not on file  Social Connections: Not on file    Tobacco Counseling Counseling given: Not Answered   Clinical Intake:  Pre-visit preparation completed: Yes  Pain : 0-10 Pain Score: 2  Pain Type: Chronic pain Pain Location: Back Pain Orientation: Mid Pain Descriptors / Indicators: Aching Pain Onset: More than a month ago Pain Frequency:  Intermittent Pain Relieving Factors: rest Effect of Pain on Daily Activities: none  Pain Relieving Factors: rest  BMI - recorded: 2952 Nutritional Status: BMI 25 -29 Overweight Nutritional Risks: Unintentional weight loss Diabetes: No  How often do you need to have someone help you when you read instructions, pamphlets, or other written materials from your doctor or pharmacy?: 4 - Often What is the last grade level you completed in school?: high school  Diabetic?no  Interpreter Needed?: No  Information entered by :: Arye Weyenberg Bretta Bang NP   Activities of Daily Living     No data to display          Patient Care Team: Virgie Dad, MD as PCP - General (Internal Medicine) Anamae Rochelle X, NP as Nurse Practitioner (Internal Medicine) Virgie Dad, MD as Consulting Physician (Internal Medicine) Virgie Dad, MD (Internal Medicine)  Indicate any recent Medical Services you may have received from other than [facility] providers in the past year (date may be approximate).     Assessment:   This is a routine wellness examination for [name].  Hearing/Vision screen No results found.  Dietary issues and exercise activities discussed:     Goals Addressed             This Visit's Progress    LIFESTYLE - DECREASE FALLS RISK         Depression Screen    06/12/2022   10:13 AM  PHQ 2/9 Scores  PHQ - 2 Score 0    Fall Risk    06/12/2022   12:58 PM 06/12/2022   10:12 AM 04/21/2022    3:50 PM 02/06/2022   10:48 AM 01/07/2022    9:04 AM  Fall Risk   Falls in the past year? 1 0 0 0 0  Number falls in past yr: 1 0 0 0 0  Injury with Fall? 0 0 0 0 0  Risk for fall due to : History of fall(s);Impaired balance/gait;Impaired mobility No Fall Risks No Fall Risks History of fall(s) History of fall(s)  Follow up Falls evaluation completed;Education provided;Falls prevention discussed;Follow up appointment Falls evaluation completed Falls evaluation completed Falls evaluation  completed Falls evaluation completed    [place]:  Any stairs in or around the home? Yes  If so, are there any without handrails? No  Home free of loose throw rugs in walkways, pet beds, electrical cords, etc? Yes  Adequate lighting in your home to reduce risk of falls? Yes   ASSISTIVE DEVICES UTILIZED TO PREVENT FALLS:  Life alert? No  Use of a cane, walker or w/c? Yes  Grab bars in the bathroom? Yes  Shower chair or bench in shower? Yes  Elevated toilet seat or a handicapped toilet? Yes   TIMED UP AND GO:  Was the test performed? Yes .  Length of time to ambulate 10 feet: 20 sec.   Gait unsteady with use of assistive device, provider informed and education provided.   Cognitive Function:        Immunizations Immunization History  Administered Date(s) Administered   Fluad Quad(high Dose 65+) 01/01/2022   Influenza, High Dose Seasonal PF 12/11/2018, 01/01/2022   Influenza-Unspecified 03/24/2018, 12/21/2019, 12/26/2020   Moderna Covid-19 Vaccine Bivalent Booster 62yrs & up 01/09/2022   Moderna SARS-COV2 Booster Vaccination 02/20/2021, 07/26/2021   Moderna Sars-Covid-2 Vaccination 03/12/2019, 04/09/2019, 04/21/2019, 01/17/2020, 08/07/2020   Pneumococcal Conjugate-13 09/09/2012, 10/28/2013   Pneumococcal Polysaccharide-23 04/02/2009, 04/16/2009   Tdap 07/15/2019   Zoster, Live 04/16/2009   Zoster, Unspecified 04/16/2009    TDAP status: Up to date  Flu Vaccine status: Up to date  Pneumococcal vaccine status: Up to date  Covid-19 vaccine status: Completed vaccines  Qualifies for Shingles Vaccine? Yes   Zostavax completed Yes   Shingrix Completed?: No.    Education has been provided regarding the importance of this vaccine. Patient has been advised to call insurance company to determine out of pocket expense if they have not yet received this vaccine. Advised may also receive vaccine at local pharmacy or Health Dept. Verbalized  acceptance and understanding.  Screening Tests Health Maintenance  Topic Date Due   Zoster Vaccines- Shingrix (1 of 2) [birth year]/1977   DEXA SCAN  Never done   COVID-19 Vaccine (9 - 2023-24 season) 03/06/2022   INFLUENZA VACCINE  10/09/2022   Medicare Annual Wellness (AWV)  06/12/2023  DTaP/Tdap/Td (2 - Td or Tdap) 07/14/2029   Pneumonia Vaccine 17+ Years old  Completed   HPV VACCINES  Aged Out    Health Maintenance  Health Maintenance Due  Topic Date Due   Zoster Vaccines- Shingrix (1 of 2) 01/07/1977   DEXA SCAN  Never done   COVID-19 Vaccine (9 - 2023-24 season) 03/06/2022    Colorectal cancer screening: No longer required.   Mammogram status: No longer required due to age.  Bone Density status: Ordered 06/12/22. Pt provided with contact info and advised to call to schedule appt.  Lung Cancer Screening: (Low Dose CT Chest recommended if Age 105-80 years, 30 pack-year currently smoking OR have quit w/in 15years.) does not qualify.     Additional Screening:  Hepatitis C Screening: does not qualify; Completed   Vision Screening: Recommended annual ophthalmology exams for early detection of glaucoma and other disorders of the eye. Is the patient up to date with their annual eye exam?  No  Who is the provider or what is the name of the office in which the patient attends annual eye exams? [facility] will provide If pt is not established with a provider, would they like to be referred to a provider to establish care? No .   Dental Screening: Recommended annual dental exams for proper oral hygiene  Community Resource Referral / Chronic Care Management: CRR required this visit?  No   CCM required this visit?  No      Plan:     I have personally reviewed and noted the following in the patient's chart:   Medical and social history Use of alcohol, tobacco or illicit drugs  Current medications and supplements including opioid prescriptions. Patient is not currently taking opioid  prescriptions. Functional ability and status Nutritional status Physical activity Advanced directives List of other physicians Hospitalizations, surgeries, and ER visits in previous 12 months Vitals Screenings to include cognitive, depression, and falls Referrals and appointments  In addition, I have reviewed and discussed with patient certain preventive protocols, quality metrics, and best practice recommendations. A written personalized care plan for preventive services as well as general preventive health recommendations were provided to patient.     Neala Miggins X Roshaunda Starkey, NP   06/12/2022

## 2022-07-02 ENCOUNTER — Non-Acute Institutional Stay (SKILLED_NURSING_FACILITY): Payer: Medicare Other | Admitting: Nurse Practitioner

## 2022-07-02 ENCOUNTER — Encounter: Payer: Self-pay | Admitting: Nurse Practitioner

## 2022-07-02 DIAGNOSIS — I10 Essential (primary) hypertension: Secondary | ICD-10-CM

## 2022-07-02 DIAGNOSIS — E039 Hypothyroidism, unspecified: Secondary | ICD-10-CM

## 2022-07-02 DIAGNOSIS — F0390 Unspecified dementia without behavioral disturbance: Secondary | ICD-10-CM

## 2022-07-02 DIAGNOSIS — N1831 Chronic kidney disease, stage 3a: Secondary | ICD-10-CM | POA: Diagnosis not present

## 2022-07-02 DIAGNOSIS — M159 Polyosteoarthritis, unspecified: Secondary | ICD-10-CM

## 2022-07-02 DIAGNOSIS — R569 Unspecified convulsions: Secondary | ICD-10-CM

## 2022-07-02 DIAGNOSIS — M109 Gout, unspecified: Secondary | ICD-10-CM | POA: Diagnosis not present

## 2022-07-02 NOTE — Assessment & Plan Note (Signed)
T5, T8, L3 compression fxs. Takes, Tylenol, Gabapentin for pain, off Tramadol, DDD per CT head/cervical spine 08/02/20. Ambulates with walker with SBA

## 2022-07-02 NOTE — Assessment & Plan Note (Signed)
Seizure like activity, likely post fall, off Tramadol, not taking anti seizure meds.  

## 2022-07-02 NOTE — Assessment & Plan Note (Signed)
09/30/21 CXR mild congestive heart failure, CXR 10/25/21 showed CHF. Takes Furosemide, compensated clinically. Echo 06/08/08 EF 60%, DOE/cough on and off.

## 2022-07-02 NOTE — Assessment & Plan Note (Signed)
stable, on Levothyroxine. TSH 3.95 06/18/21, repeat TSH

## 2022-07-02 NOTE — Assessment & Plan Note (Signed)
Bun/creat 35/1.45 06/05/22 

## 2022-07-02 NOTE — Assessment & Plan Note (Signed)
blood pressure is controlled on Benazepril, Amlodipine.  

## 2022-07-02 NOTE — Assessment & Plan Note (Signed)
stable, on Allopurinol 

## 2022-07-02 NOTE — Assessment & Plan Note (Signed)
07/15/19 CT head Mild generalized parenchymal atrophy and chronic small vessel ischemic disease, tolerated Memantine well.      

## 2022-07-02 NOTE — Progress Notes (Signed)
Location:   SNF [facility] Room Number: 74 Place of Service:  SNF (31) Provider: [facility] Alicea Wente NP  Mahlon Gammon, MD  Patient Care Team: Mahlon Gammon, MD as PCP - General (Internal Medicine) Butch Otterson X, NP as Nurse Practitioner (Internal Medicine) Mahlon Gammon, MD as Consulting Physician (Internal Medicine) Mahlon Gammon, MD (Internal Medicine)  Extended Emergency Contact Information Primary Emergency Contact: Mechele Claude of [place] Home Phone: [phone] Relation: Son Secondary Emergency Contact: Schmierer,[name] Mobile Phone: [phone] Relation: Son  Code Status:  DNR Goals of care: Advanced Directive information    06/12/2022   10:15 AM  Advanced Directives  Does Patient Have a Medical Advance Directive? Yes  Type of Estate agent of [place];Out of facility DNR (pink MOST or yellow form)  Does patient want to make changes to medical advance directive? No - Patient declined  Copy of Healthcare Power of Attorney in Chart? Yes - validated most recent copy scanned in chart (See row information)  Pre-existing out of facility DNR order (yellow form or pink MOST form) Pink MOST/Yellow Form most recent copy in chart - Physician notified to receive inpatient order     Chief Complaint  Patient presents with   Medical Management of Chronic Issues    HPI:  Pt is a 89 y.o. female seen today for medical management of chronic diseases.    CHF, 09/30/21 CXR mild congestive heart failure, CXR 10/25/21 showed CHF. Takes Furosemide, compensated clinically. Echo 06/08/08 EF 60%, DOE/cough on and off.             Peripheral neuropathy, taking Gabapentin, Vit B12 Gait abnormality, uses walker to ambulate slowly.       Hx of TIA. Allergic to statin. On ASA.  HPOA desired not hospital evaluation. Seizure like activity, likely post fall, off Tramadol, not taking anti seizure meds.  OA: T5, T8, L3 compression fxs. Takes, Tylenol, Gabapentin for  pain, off Tramadol, DDD per CT head/cervical spine 08/02/20              Hx of CVA per CT head 08/02/20 Remote infarcts within the a occipital cortices bilaterally again noted. Interval development of remote infarcts within the left parietal lobe and periventricular white matte. On ASA            Dementia, 07/15/19 CT head Mild generalized parenchymal atrophy and chronic small vessel ischemic disease, tolerated Memantine well.                          HTN, blood pressure is controlled on Benazepril, Amlodipine.              CKD Bun/creat 35/1.45 06/05/22             Gout stable, on Allopurinol                   Hypothyroidism, stable, on Levothyroxine. TSH 3.95 06/18/21              Abnormal CXR, suggested CT chest, not candidate for workup    Past Medical History:  Diagnosis Date   Cognitive changes    Dementia (HCC)    Depression    Gout    Hypertension    Hypothyroidism    Peripheral neuropathy    History reviewed. No pertinent surgical history.  Allergies  Allergen Reactions   Actonel [Risedronate Sodium]    Actonel [Risedronate] Other (See Comments)    "  Allergic," per [facility]   Hct [Hydrochlorothiazide]    Hydrochlorothiazide Other (See Comments)    "Allergic," per [facility]   Lipitor [Atorvastatin Calcium]    Lipitor [Atorvastatin] Other (See Comments)    "Allergic," per [facility]   Neomycin Other (See Comments)    Unknown "been so long ago, a Dermatologist told me"   Neomycin Other (See Comments)    "Allergic," per [facility]   Polysporin [Bacitracin-Polymyxin B]    Polysporin [Bacitracin-Polymyxin B] Other (See Comments)    "Allergic," per MAR   Prolia [Denosumab]    Prolia [Denosumab] Other (See Comments)   Sulfa Antibiotics    Sulfa Antibiotics Other (See Comments)    "Allergic," per [facility]   Zocor [Simvastatin]    Zocor [Simvastatin] Other (See Comments)    "Allergic," per [facility]    Allergies as of 07/02/2022       Reactions   Actonel [risedronate Sodium]    Actonel [risedronate] Other  (See Comments)   "Allergic," per [facility]   Hct [hydrochlorothiazide]    Hydrochlorothiazide Other (See Comments)   "Allergic," per [facility]   Lipitor [atorvastatin Calcium]    Lipitor [atorvastatin] Other (See Comments)   "Allergic," per [facility]   Neomycin Other (See Comments)   Unknown "been so long ago, a Dermatologist told me"   Neomycin Other (See Comments)   "Allergic," per [facility]   Polysporin [bacitracin-polymyxin B]    Polysporin [bacitracin-polymyxin B] Other (See Comments)   "Allergic," per [facility]   Prolia [denosumab]    Prolia [denosumab] Other (See Comments)   Sulfa Antibiotics    Sulfa Antibiotics Other (See Comments)   "Allergic," per [facility]   Zocor [simvastatin]    Zocor [simvastatin] Other (See Comments)   "Allergic," per [facility]        Medication List        Accurate as of July 02, 2022 11:59 PM. If you have any questions, ask your nurse or doctor.          acetaminophen 325 MG tablet Commonly known as: TYLENOL Take 650 mg by mouth in the morning, at noon, and at bedtime.   allopurinol 100 MG tablet Commonly known as: ZYLOPRIM Take 100 mg by mouth daily.   amLODipine 2.5 MG tablet Commonly known as: NORVASC Take 2.5 mg by mouth daily.   aspirin 81 MG chewable tablet Chew 81 mg by mouth daily.   benazepril 10 MG tablet Commonly known as: LOTENSIN Take 10 mg by mouth in the morning.   Calcium Carb-Cholecalciferol 500-400 MG-UNIT Tabs Take 2 tablets by mouth daily.   Co Q-10 100 MG Caps Take 200 mg by mouth in the morning.   furosemide 20 MG tablet Commonly known as: LASIX Take 1 tablet (20 mg total) by mouth daily.   gabapentin 100 MG capsule Commonly known as: NEURONTIN Take 200 mg by mouth at bedtime.   levothyroxine 100 MCG tablet Commonly known as: SYNTHROID Take 100 mcg by mouth once a week. On Wednesday   levothyroxine 50 MCG tablet Commonly known as: SYNTHROID Take 50 mcg by mouth daily before breakfast. Sun,Mon,Tue, Thur,Fri,Sat   memantine  10 MG tablet Commonly known as: NAMENDA Take 10 mg by mouth 2 (two) times daily.   Omega III EPA+DHA 1000 MG Caps Take 2,000 mg by mouth 2 (two) times daily.   potassium chloride 10 MEQ tablet Commonly known as: KLOR-CON M Take 1 tablet (10 mEq total) by mouth 2 (two) times daily.   Vitamin D 50 MCG (2000 UT) Caps Take 2,000  Units by mouth daily.        Review of Systems  Constitutional:  Positive for fatigue. Negative for activity change and fever.  HENT:  Positive for hearing loss. Negative for congestion and voice change.   Eyes:  Negative for visual disturbance.  Respiratory:  Positive for shortness of breath. Negative for cough and wheezing.        DOE  Cardiovascular:  Positive for leg swelling.  Gastrointestinal:  Negative for abdominal pain and constipation.  Genitourinary:  Negative for dysuria and urgency.  Musculoskeletal:  Positive for arthralgias, back pain and gait problem.       Positional lower  portion of the mid back pain. No c/o neck, right hip, or lower back pain.   Skin:  Negative for color change.  Neurological:  Negative for speech difficulty, weakness and light-headedness.  Psychiatric/Behavioral:  Positive for confusion. Negative for behavioral problems and sleep disturbance. The patient is not nervous/anxious.     Immunization History  Administered Date(s) Administered   Fluad Quad(high Dose 65+) 01/01/2022   Influenza, High Dose Seasonal PF 12/11/2018, 01/01/2022   Influenza-Unspecified 03/24/2018, 12/21/2019, 12/26/2020   Moderna Covid-19 Vaccine Bivalent Booster 53yrs & up 01/09/2022   Moderna SARS-COV2 Booster Vaccination 02/20/2021, 07/26/2021   Moderna Sars-Covid-2 Vaccination 03/12/2019, 04/09/2019, 04/21/2019, 01/17/2020, 08/07/2020   Pneumococcal Conjugate-13 09/09/2012, 10/28/2013   Pneumococcal Polysaccharide-23 04/02/2009, 04/16/2009   Tdap 07/15/2019   Zoster Recombinat (Shingrix) 06/20/2022   Zoster, Live 04/16/2009   Zoster,  Unspecified 04/16/2009   Pertinent  Health Maintenance Due  Topic Date Due   DEXA SCAN  07/02/2023 (Originally 01/[birth year]/1992)   INFLUENZA VACCINE  10/09/2022      01/07/2022    9:04 AM 02/06/2022   10:48 AM 04/21/2022    3:50 PM 06/12/2022   10:12 AM 06/12/2022   12:58 PM  Fall Risk  Falls in the past year? 0 0 0 0 1  Was there an injury with Fall? 0 0 0 0 0  Fall Risk Category Calculator 0 0 0 0 2  Fall Risk Category (Retired) Low Low     (RETIRED) Patient Fall Risk Level Moderate fall risk Moderate fall risk     Patient at Risk for Falls Due to History of fall(s) History of fall(s) No Fall Risks No Fall Risks History of fall(s);Impaired balance/gait;Impaired mobility  Fall risk Follow up Falls evaluation completed Falls evaluation completed Falls evaluation completed Falls evaluation completed Falls evaluation completed;Education provided;Falls prevention discussed;Follow up appointment   Functional Status Survey:    Vitals:   07/02/22 1325  BP: 138/68  Pulse: 83  Resp: 16  Temp: 97.6 F (36.4 C)  SpO2: 94%  Weight: 172 lb (78 kg)   Body mass index is 29.52 kg/m. Physical Exam Vitals and nursing note reviewed.  HENT:     Head: Normocephalic and atraumatic.     Nose: Nose normal.     Mouth/Throat:     Mouth: Mucous membranes are moist.  Eyes:     Extraocular Movements: Extraocular movements intact.     Conjunctiva/sclera: Conjunctivae normal.     Pupils: Pupils are equal, round, and reactive to light.  Cardiovascular:     Rate and Rhythm: Normal rate and regular rhythm.     Heart sounds: No murmur heard.    Comments: Weak dorsalis pedis pulses R+L from previous examination.  Pulmonary:     Effort: Pulmonary effort is normal.     Breath sounds: Rales present. No wheezing or rhonchi.  Comments: Bibasilar rales.  Abdominal:     General: Bowel sounds are normal.     Palpations: Abdomen is soft.     Tenderness: There is no abdominal tenderness.  Musculoskeletal:      Cervical back: Normal range of motion and neck supple.     Right lower leg: Edema present.     Left lower leg: Edema present.     Comments: trace edema BLE  Skin:    General: Skin is warm and dry.  Neurological:     General: No focal deficit present.     Mental Status: She is alert. Mental status is at baseline.     Motor: No weakness.     Coordination: Coordination normal.     Gait: Gait abnormal.  Psychiatric:        Mood and Affect: Mood normal.        Behavior: Behavior normal.     Labs reviewed: Recent Labs    12/12/21 0000 05/06/22 1200 06/05/22 0600  NA 145 138 139  139  K 4.3 4.4 4.0  4.0  CL 107 101 105  106  CO2 27* 29* 26*  26*  BUN 29* 32* 35*  35*  CREATININE 1.3* 1.8* 1.5*  1.5*  CALCIUM 9.3 9.6 9.0   Recent Labs    10/01/21 0000 11/05/21 0000 05/06/22 1200 06/05/22 0600  AST 13 15 13   --   ALT 9 9 10   --   ALKPHOS 80 89 89 3.9*  ALBUMIN 3.9 3.9 4.3 3.4*   Recent Labs    10/01/21 0000 11/05/21 0000 05/06/22 1200  WBC 7.4 8.8 9.5  NEUTROABS 5,469.00  --  7,011.00  HGB 12.8 12.6 12.8  HCT 38 37 38  PLT  --  231 227   Lab Results  Component Value Date   TSH 2.34 06/22/2020   No results found for: "HGBA1C" No results found for: "CHOL", "HDL", "LDLCALC", "LDLDIRECT", "TRIG", "CHOLHDL"  Significant Diagnostic Results in last 30 days:  No results found.  Assessment/Plan  Senile dementia (HCC) 07/15/19 CT head Mild generalized parenchymal atrophy and chronic small vessel ischemic disease, tolerated Memantine well.       HTN (hypertension)  blood pressure is controlled on Benazepril, Amlodipine.   CKD (chronic kidney disease) stage 3, GFR 30-59 ml/min (HCC) Bun/creat 35/1.45 06/05/22  Gout stable, on Allopurinol       Hypothyroidism  stable, on Levothyroxine. TSH 3.95 06/18/21, repeat TSH  Generalized osteoarthritis of multiple sites  T5, T8, L3 compression fxs. Takes, Tylenol, Gabapentin for pain, off Tramadol, DDD per CT  head/cervical spine 08/02/20. Ambulates with walker with SBA  Seizure-like activity (HCC) Seizure like activity, likely post fall, off Tramadol, not taking anti seizure meds.   Congestive heart failure (CHF) (HCC) 09/30/21 CXR mild congestive heart failure, CXR 10/25/21 showed CHF. Takes Furosemide, compensated clinically. Echo 06/08/08 EF 60%, DOE/cough on and off.   Family/ staff Communication: plan of care reviewed with the patient and charge nurse.   Labs/tests ordered:  TSH   Time spend 35 minutes.

## 2022-08-19 ENCOUNTER — Non-Acute Institutional Stay (SKILLED_NURSING_FACILITY): Payer: Medicare Other | Admitting: Nurse Practitioner

## 2022-08-19 ENCOUNTER — Encounter: Payer: Self-pay | Admitting: Nurse Practitioner

## 2022-08-19 DIAGNOSIS — M109 Gout, unspecified: Secondary | ICD-10-CM

## 2022-08-19 DIAGNOSIS — R569 Unspecified convulsions: Secondary | ICD-10-CM

## 2022-08-19 DIAGNOSIS — G609 Hereditary and idiopathic neuropathy, unspecified: Secondary | ICD-10-CM

## 2022-08-19 DIAGNOSIS — F0390 Unspecified dementia without behavioral disturbance: Secondary | ICD-10-CM

## 2022-08-19 DIAGNOSIS — M159 Polyosteoarthritis, unspecified: Secondary | ICD-10-CM | POA: Diagnosis not present

## 2022-08-19 DIAGNOSIS — I509 Heart failure, unspecified: Secondary | ICD-10-CM

## 2022-08-19 DIAGNOSIS — E039 Hypothyroidism, unspecified: Secondary | ICD-10-CM

## 2022-08-19 DIAGNOSIS — I10 Essential (primary) hypertension: Secondary | ICD-10-CM

## 2022-08-19 DIAGNOSIS — R2681 Unsteadiness on feet: Secondary | ICD-10-CM | POA: Diagnosis not present

## 2022-08-19 DIAGNOSIS — G459 Transient cerebral ischemic attack, unspecified: Secondary | ICD-10-CM

## 2022-08-19 DIAGNOSIS — N1831 Chronic kidney disease, stage 3a: Secondary | ICD-10-CM

## 2022-08-19 NOTE — Assessment & Plan Note (Signed)
09/30/21 CXR mild congestive heart failure, CXR 10/25/21 showed CHF. Takes Furosemide, compensated clinically. Echo 06/08/08 EF 60%, DOE/cough on and off, trace edema BLE

## 2022-08-19 NOTE — Assessment & Plan Note (Signed)
uses walker to ambulate slowly.

## 2022-08-19 NOTE — Assessment & Plan Note (Signed)
blood pressure is controlled on Benazepril, Amlodipine.  

## 2022-08-19 NOTE — Assessment & Plan Note (Signed)
on Allopurinol  

## 2022-08-19 NOTE — Assessment & Plan Note (Signed)
07/15/19 CT head Mild generalized parenchymal atrophy and chronic small vessel ischemic disease, tolerated Memantine well.      

## 2022-08-19 NOTE — Assessment & Plan Note (Addendum)
Hx of TIA. Allergic to statin. On ASA.  HPOA desired not hospital evaluation. Hx of CVA per CT head 08/02/20 Remote infarcts within the a occipital cortices bilaterally again noted. Interval development of remote infarcts within the left parietal lobe and periventricular white matte. On ASA

## 2022-08-19 NOTE — Assessment & Plan Note (Addendum)
Hx of T5, T8, L3 compression fxs. Takes, Tylenol, Gabapentin for pain, off Tramadol, DDD per CT head/cervical spine 08/02/20    the staff reported the patient's left hip pain, refused to walk to dinner room for breakfast. The patient denied pain, ambulated with walker upon my examination, nurse presented during my visit.   Observe.

## 2022-08-19 NOTE — Progress Notes (Signed)
Location:   SNF [facility] Room Number: NO/31/A Place of Service:  SNF (31) Provider: [facility] Lummie Montijo NP  Mahlon Gammon, MD  Patient Care Team: Mahlon Gammon, MD as PCP - General (Internal Medicine) Kassem Kibbe X, NP as Nurse Practitioner (Internal Medicine) Mahlon Gammon, MD as Consulting Physician (Internal Medicine) Mahlon Gammon, MD (Internal Medicine)  Extended Emergency Contact Information Primary Emergency Contact: Mechele Claude of [place] Home Phone: [phone] Relation: Son Secondary Emergency Contact: Housley,[name] Mobile Phone: [phone] Relation: Son  Code Status: DNR Goals of care: Advanced Directive information    06/12/2022   10:15 AM  Advanced Directives  Does Patient Have a Medical Advance Directive? Yes  Type of Estate agent of [place];Out of facility DNR (pink MOST or yellow form)  Does patient want to make changes to medical advance directive? No - Patient declined  Copy of Healthcare Power of Attorney in Chart? Yes - validated most recent copy scanned in chart (See row information)  Pre-existing out of facility DNR order (yellow form or pink MOST form) Pink MOST/Yellow Form most recent copy in chart - Physician notified to receive inpatient order     Chief Complaint  Patient presents with   Acute Visit    Patient is being seen for left hip pain    HPI:  Pt is a 89 y.o. female seen today for an acute visit for the staff reported the patient's left hip pain, refused to walk to dinner room for breakfast. The patient denied pain, ambulated with walker upon my examination, nurse presented during my visit.    CHF, 09/30/21 CXR mild congestive heart failure, CXR 10/25/21 showed CHF. Takes Furosemide, compensated clinically. Echo 06/08/08 EF 60%, DOE/cough on and off.             Peripheral neuropathy, taking Gabapentin, Vit B12 Gait abnormality, uses walker to ambulate slowly.       Hx of TIA. Allergic to statin. On  ASA.  HPOA desired not hospital evaluation. Seizure like activity, likely post fall, off Tramadol, not taking anti seizure meds.  OA: T5, T8, L3 compression fxs. Takes, Tylenol, Gabapentin for pain, off Tramadol, DDD per CT head/cervical spine 08/02/20              Hx of CVA per CT head 08/02/20 Remote infarcts within the a occipital cortices bilaterally again noted. Interval development of remote infarcts within the left parietal lobe and periventricular white matte. On ASA            Dementia, 07/15/19 CT head Mild generalized parenchymal atrophy and chronic small vessel ischemic disease, tolerated Memantine well.                          HTN, blood pressure is controlled on Benazepril, Amlodipine.              CKD Bun/creat 35/1.45 06/05/22             Gout stable, on Allopurinol                   Hypothyroidism, stable, on Levothyroxine. TSH 3.21 07/03/22              Abnormal CXR, suggested CT chest, not candidate for workup      Past Medical History:  Diagnosis Date   Cognitive changes    Dementia (HCC)    Depression    Gout    Hypertension  Hypothyroidism    Peripheral neuropathy    History reviewed. No pertinent surgical history.  Allergies  Allergen Reactions   Actonel [Risedronate Sodium]    Actonel [Risedronate] Other (See Comments)    "Allergic," per [facility]   Hct [Hydrochlorothiazide]    Hydrochlorothiazide Other (See Comments)    "Allergic," per [facility]   Lipitor [Atorvastatin Calcium]    Lipitor [Atorvastatin] Other (See Comments)    "Allergic," per [facility]   Neomycin Other (See Comments)    Unknown "been so long ago, a Dermatologist told me"   Neomycin Other (See Comments)    "Allergic," per [facility]   Polysporin [Bacitracin-Polymyxin B]    Polysporin [Bacitracin-Polymyxin B] Other (See Comments)    "Allergic," per [facility]   Prolia [Denosumab]    Prolia [Denosumab] Other (See Comments)   Sulfa Antibiotics    Sulfa Antibiotics Other (See Comments)    "Allergic," per [facility]   Zocor  [Simvastatin]    Zocor [Simvastatin] Other (See Comments)    "Allergic," per [facility]    Allergies as of 08/19/2022       Reactions   Actonel [risedronate Sodium]    Actonel [risedronate] Other (See Comments)   "Allergic," per [facility]   Hct [hydrochlorothiazide]    Hydrochlorothiazide Other (See Comments)   "Allergic," per [facility]   Lipitor [atorvastatin Calcium]    Lipitor [atorvastatin] Other (See Comments)   "Allergic," per [facility]   Neomycin Other (See Comments)   Unknown "been so long ago, a Dermatologist told me"   Neomycin Other (See Comments)   "Allergic," per [facility]   Polysporin [bacitracin-polymyxin B]    Polysporin [bacitracin-polymyxin B] Other (See Comments)   "Allergic," per [facility]   Prolia [denosumab]    Prolia [denosumab] Other (See Comments)   Sulfa Antibiotics    Sulfa Antibiotics Other (See Comments)   "Allergic," per [facility]   Zocor [simvastatin]    Zocor [simvastatin] Other (See Comments)   "Allergic," per [facility]        Medication List        Accurate as of August 19, 2022  4:43 PM. If you have any questions, ask your nurse or doctor.          acetaminophen 325 MG tablet Commonly known as: TYLENOL Take 650 mg by mouth in the morning, at noon, and at bedtime.   allopurinol 100 MG tablet Commonly known as: ZYLOPRIM Take 100 mg by mouth daily.   amLODipine 2.5 MG tablet Commonly known as: NORVASC Take 2.5 mg by mouth daily.   aspirin 81 MG chewable tablet Chew 81 mg by mouth daily.   benazepril 10 MG tablet Commonly known as: LOTENSIN Take 10 mg by mouth in the morning.   Calcium Carb-Cholecalciferol 500-400 MG-UNIT Tabs Take 2 tablets by mouth daily.   Co Q-10 100 MG Caps Take 200 mg by mouth in the morning.   furosemide 20 MG tablet Commonly known as: LASIX Take 1 tablet (20 mg total) by mouth daily.   gabapentin 100 MG capsule Commonly known as: NEURONTIN Take 200 mg by mouth at bedtime.   levothyroxine 100 MCG tablet Commonly known as:  SYNTHROID Take 100 mcg by mouth once a week. On Wednesday   levothyroxine 50 MCG tablet Commonly known as: SYNTHROID Take 50 mcg by mouth daily before breakfast. Sun,Mon,Tue, Thur,Fri,Sat   memantine 10 MG tablet Commonly known as: NAMENDA Take 10 mg by mouth 2 (two) times daily.   Omega III EPA+DHA 1000 MG Caps Take 2,000 mg by mouth 2 (two)  times daily.   potassium chloride 10 MEQ tablet Commonly known as: KLOR-CON M Take 1 tablet (10 mEq total) by mouth 2 (two) times daily.   Vitamin D 50 MCG (2000 UT) Caps Take 2,000 Units by mouth daily.        Review of Systems  Constitutional:  Negative for activity change, fatigue and fever.  HENT:  Positive for hearing loss. Negative for congestion and voice change.   Eyes:  Negative for visual disturbance.  Respiratory:  Positive for shortness of breath. Negative for cough and wheezing.        DOE  Cardiovascular:  Positive for leg swelling.  Gastrointestinal:  Negative for abdominal pain and constipation.  Genitourinary:  Negative for dysuria and urgency.  Musculoskeletal:  Positive for arthralgias, back pain and gait problem.       Positional lower  portion of the mid back pain. No c/o neck, right hip, or lower back pain.   Skin:  Negative for color change.  Neurological:  Negative for speech difficulty, weakness and light-headedness.  Psychiatric/Behavioral:  Positive for confusion. Negative for behavioral problems and sleep disturbance. The patient is not nervous/anxious.     Immunization History  Administered Date(s) Administered   Fluad Quad(high Dose 65+) 01/01/2022   Influenza, High Dose Seasonal PF 12/11/2018, 01/01/2022   Influenza-Unspecified 03/24/2018, 12/21/2019, 12/26/2020   Moderna Covid-19 Vaccine Bivalent Booster 66yrs & up 01/09/2022   Moderna SARS-COV2 Booster Vaccination 02/20/2021, 07/26/2021   Moderna Sars-Covid-2 Vaccination 03/12/2019, 04/09/2019, 04/21/2019, 01/17/2020, 08/07/2020   Pneumococcal  Conjugate-13 09/09/2012, 10/28/2013   Pneumococcal Polysaccharide-23 04/02/2009, 04/16/2009   Tdap 07/15/2019   Zoster Recombinat (Shingrix) 06/20/2022   Zoster, Live 04/16/2009   Zoster, Unspecified 04/16/2009   Pertinent  Health Maintenance Due  Topic Date Due   DEXA SCAN  07/02/2023 (Originally 01/08/1990)   INFLUENZA VACCINE  10/09/2022      01/07/2022    9:04 AM 02/06/2022   10:48 AM 04/21/2022    3:50 PM 06/12/2022   10:12 AM 06/12/2022   12:58 PM  Fall Risk  Falls in the past year? 0 0 0 0 1  Was there an injury with Fall? 0 0 0 0 0  Fall Risk Category Calculator 0 0 0 0 2  Fall Risk Category (Retired) Low Low     (RETIRED) Patient Fall Risk Level Moderate fall risk Moderate fall risk     Patient at Risk for Falls Due to History of fall(s) History of fall(s) No Fall Risks No Fall Risks History of fall(s);Impaired balance/gait;Impaired mobility  Fall risk Follow up Falls evaluation completed Falls evaluation completed Falls evaluation completed Falls evaluation completed Falls evaluation completed;Education provided;Falls prevention discussed;Follow up appointment   Functional Status Survey:    Vitals:   08/19/22 1437  BP: 125/60  Pulse: 76  Resp: 17  Temp: (!) 96.7 F (35.9 C)  SpO2: 95%  Weight: 173 lb 9.6 oz (78.7 kg)  Height: 5\' 4"  (1.626 m)   Body mass index is 29.8 kg/m. Physical Exam Vitals and nursing note reviewed.  HENT:     Head: Normocephalic and atraumatic.     Nose: Nose normal.     Mouth/Throat:     Mouth: Mucous membranes are moist.  Eyes:     Extraocular Movements: Extraocular movements intact.     Conjunctiva/sclera: Conjunctivae normal.     Pupils: Pupils are equal, round, and reactive to light.  Cardiovascular:     Rate and Rhythm: Normal rate and regular rhythm.  Heart sounds: No murmur heard.    Comments: Weak dorsalis pedis pulses R+L from previous examination.  Pulmonary:     Effort: Pulmonary effort is normal.     Breath  sounds: Rales present. No wheezing or rhonchi.     Comments: Bibasilar rales.  Abdominal:     General: Bowel sounds are normal.     Palpations: Abdomen is soft.     Tenderness: There is no abdominal tenderness.  Musculoskeletal:     Cervical back: Normal range of motion and neck supple.     Right lower leg: Edema present.     Left lower leg: Edema present.     Comments: trace edema BLE. No pain in spine, shoulders, hips, knees today.   Skin:    General: Skin is warm and dry.  Neurological:     General: No focal deficit present.     Mental Status: She is alert. Mental status is at baseline.     Motor: No weakness.     Coordination: Coordination normal.     Gait: Gait abnormal.  Psychiatric:        Mood and Affect: Mood normal.        Behavior: Behavior normal.     Labs reviewed: Recent Labs    12/12/21 0000 05/06/22 1200 06/05/22 0600  NA 145 138 139  139  K 4.3 4.4 4.0  4.0  CL 107 101 105  106  CO2 27* 29* 26*  26*  BUN 29* 32* 35*  35*  CREATININE 1.3* 1.8* 1.5*  1.5*  CALCIUM 9.3 9.6 9.0   Recent Labs    10/01/21 0000 11/05/21 0000 05/06/22 1200 06/05/22 0600  AST 13 15 13   --   ALT 9 9 10   --   ALKPHOS 80 89 89 3.9*  ALBUMIN 3.9 3.9 4.3 3.4*   Recent Labs    10/01/21 0000 11/05/21 0000 05/06/22 1200  WBC 7.4 8.8 9.5  NEUTROABS 5,469.00  --  7,011.00  HGB 12.8 12.6 12.8  HCT 38 37 38  PLT  --  231 227   Lab Results  Component Value Date   TSH 2.34 06/22/2020   No results found for: "HGBA1C" No results found for: "CHOL", "HDL", "LDLCALC", "LDLDIRECT", "TRIG", "CHOLHDL"  Significant Diagnostic Results in last 30 days:  No results found.  Assessment/Plan: Generalized osteoarthritis of multiple sites Hx of T5, T8, L3 compression fxs. Takes, Tylenol, Gabapentin for pain, off Tramadol, DDD per CT head/cervical spine 08/02/20    the staff reported the patient's left hip pain, refused to walk to dinner room for breakfast. The patient denied  pain, ambulated with walker upon my examination, nurse presented during my visit.   Observe.     Congestive heart failure (CHF) (HCC) 09/30/21 CXR mild congestive heart failure, CXR 10/25/21 showed CHF. Takes Furosemide, compensated clinically. Echo 06/08/08 EF 60%, DOE/cough on and off, trace edema BLE  Peripheral neuropathy taking Gabapentin, Vit B12  Gait instability uses walker to ambulate slowly.      TIA (transient ischemic attack) Hx of TIA. Allergic to statin. On ASA.  HPOA desired not hospital evaluation. Hx of CVA per CT head 08/02/20 Remote infarcts within the a occipital cortices bilaterally again noted. Interval development of remote infarcts within the left parietal lobe and periventricular white matte. On ASA  Seizure-like activity (HCC)  likely post fall, off Tramadol, not taking anti seizure meds.   Senile dementia (HCC) 07/15/19 CT head Mild generalized parenchymal atrophy and chronic small vessel  ischemic disease, tolerated Memantine well.           HTN (hypertension) blood pressure is controlled on Benazepril, Amlodipine.   CKD (chronic kidney disease) stage 3, GFR 30-59 ml/min (HCC) Bun/creat 35/1.45 06/05/22  Gout on Allopurinol        Hypothyroidism  stable, on Levothyroxine. TSH 3.21 07/03/22     Family/ staff Communication: plan of care reviewed with the patient and charge nurse.   Labs/tests ordered:  none  Time spend 35 minutes.

## 2022-08-19 NOTE — Assessment & Plan Note (Signed)
likely post fall, off Tramadol, not taking anti seizure meds.  

## 2022-08-19 NOTE — Assessment & Plan Note (Signed)
taking Gabapentin, Vit B12 

## 2022-08-19 NOTE — Assessment & Plan Note (Signed)
Bun/creat 35/1.45 06/05/22 

## 2022-08-19 NOTE — Assessment & Plan Note (Signed)
stable, on Levothyroxine. TSH 3.21 07/03/22

## 2022-09-02 ENCOUNTER — Non-Acute Institutional Stay (SKILLED_NURSING_FACILITY): Payer: Medicare Other | Admitting: Family Medicine

## 2022-09-02 DIAGNOSIS — R2681 Unsteadiness on feet: Secondary | ICD-10-CM

## 2022-09-02 DIAGNOSIS — F03918 Unspecified dementia, unspecified severity, with other behavioral disturbance: Secondary | ICD-10-CM

## 2022-09-02 DIAGNOSIS — I509 Heart failure, unspecified: Secondary | ICD-10-CM | POA: Diagnosis not present

## 2022-09-02 DIAGNOSIS — N1831 Chronic kidney disease, stage 3a: Secondary | ICD-10-CM

## 2022-09-02 DIAGNOSIS — I10 Essential (primary) hypertension: Secondary | ICD-10-CM

## 2022-09-02 NOTE — Progress Notes (Deleted)
.  smm 

## 2022-09-02 NOTE — Progress Notes (Signed)
Provider:  Jacalyn Lefevre, MD Location:      Place of Service:     PCP: Mahlon Gammon, MD Patient Care Team: Mahlon Gammon, MD as PCP - General (Internal Medicine) Mast, Man X, NP as Nurse Practitioner (Internal Medicine) Mahlon Gammon, MD as Consulting Physician (Internal Medicine) Mahlon Gammon, MD (Internal Medicine)  Extended Emergency Contact Information Primary Emergency Contact: [name] of [place] Home Phone: [phone] Relation: Son Secondary Emergency Contact: [name],[name] Mobile Phone: [phone] Relation: Son  Code Status:  Goals of Care: Advanced Directive information    06/12/2022   10:15 AM  Advanced Directives  Does Patient Have a Medical Advance Directive? Yes  Type of Estate agent of [place];Out of facility DNR (pink MOST or yellow form)  Does patient want to make changes to medical advance directive? No - Patient declined  Copy of Healthcare Power of Attorney in Chart? Yes - validated most recent copy scanned in chart (See row information)  Pre-existing out of facility DNR order (yellow form or pink MOST form) Pink MOST/Yellow Form most recent copy in chart - Physician notified to receive inpatient order      No chief complaint on file.   HPI: Patient is a 88 y.o. female seen today for medical management of chronic problems including dementia with behavioral disturbance, congestive heart failure, chronic kidney disease, hypertension, hypothyroidism. I found her in the living room sleeping; I see her often in this location.  She denies any complaints today.  Nursing staff report no recent problems.  She continues to take furosemide 20 mg for heart failure.  There is no shortness of breath or dependent edema. For her dementia she takes Namenda, Synthroid for hypothyroidism and benazepril and furosemide for blood pressure  Past Medical History:  Diagnosis Date   Cognitive changes    Dementia (HCC)     Depression    Gout    Hypertension    Hypothyroidism    Peripheral neuropathy    No past surgical history on file.  reports that she has an unknown smoking status. She has never used smokeless tobacco. She reports that she does not drink alcohol and does not use drugs. Social History   Socioeconomic History   Marital status: Widowed    Spouse name: Not on file   Number of children: Not on file   Years of education: Not on file   Highest education level: Not on file  Occupational History   Not on file  Tobacco Use   Smoking status: Unknown   Smokeless tobacco: Never  Vaping Use   Vaping Use: Never used  Substance and Sexual Activity   Alcohol use: Never   Drug use: Never   Sexual activity: Not on file  Other Topics Concern   Not on file  Social History Narrative   ** Merged History Encounter **       Social Determinants of Health   Financial Resource Strain: Not on file  Food Insecurity: Not on file  Transportation Needs: Not on file  Physical Activity: Not on file  Stress: Not on file  Social Connections: Not on file  Intimate Partner Violence: Not on file    Functional Status Survey:    No family history on file.  Health Maintenance  Topic Date Due   COVID-19 Vaccine (9 - 2023-24 season) 03/06/2022   Zoster Vaccines- Shingrix (2 of 2) 08/15/2022   DEXA SCAN  07/02/2023 (Originally 01/08/1990)   INFLUENZA VACCINE  10/09/2022   Medicare Annual Wellness (AWV)  06/12/2023   DTaP/Tdap/Td (2 - Td or Tdap) 07/14/2029   Pneumonia Vaccine 72+ Years old  Completed   HPV VACCINES  Aged Out    Allergies  Allergen Reactions   Actonel [Risedronate Sodium]    Actonel [Risedronate] Other (See Comments)    "Allergic," per [facility]   Hct [Hydrochlorothiazide]    Hydrochlorothiazide Other (See Comments)    "Allergic," per [facility]   Lipitor [Atorvastatin Calcium]    Lipitor [Atorvastatin] Other (See Comments)    "Allergic," per [facility]   Neomycin Other (See Comments)     Unknown "been so long ago, a Dermatologist told me"   Neomycin Other (See Comments)    "Allergic," per [facility]   Polysporin [Bacitracin-Polymyxin B]    Polysporin [Bacitracin-Polymyxin B] Other (See Comments)    "Allergic," per [facility]   Prolia [Denosumab]    Prolia [Denosumab] Other (See Comments)   Sulfa Antibiotics    Sulfa Antibiotics Other (See Comments)    "Allergic," per [facility]   Zocor [Simvastatin]    Zocor [Simvastatin] Other (See Comments)    "Allergic," per [facility]    Outpatient Encounter Medications as of 09/02/2022  Medication Sig   acetaminophen (TYLENOL) 325 MG tablet Take 650 mg by mouth in the morning, at noon, and at bedtime.   allopurinol (ZYLOPRIM) 100 MG tablet Take 100 mg by mouth daily.   amLODipine (NORVASC) 2.5 MG tablet Take 2.5 mg by mouth daily.   aspirin 81 MG chewable tablet Chew 81 mg by mouth daily.   benazepril (LOTENSIN) 10 MG tablet Take 10 mg by mouth in the morning.   Calcium Carb-Cholecalciferol 500-400 MG-UNIT TABS Take 2 tablets by mouth daily.    Cholecalciferol (VITAMIN D) 50 MCG (2000 UT) CAPS Take 2,000 Units by mouth daily.   Coenzyme Q10 (CO Q-10) 100 MG CAPS Take 200 mg by mouth in the morning.   furosemide (LASIX) 20 MG tablet Take 1 tablet (20 mg total) by mouth daily.   gabapentin (NEURONTIN) 100 MG capsule Take 200 mg by mouth at bedtime.   levothyroxine (SYNTHROID) 100 MCG tablet Take 100 mcg by mouth once a week. On Wednesday   levothyroxine (SYNTHROID) 50 MCG tablet Take 50 mcg by mouth daily before breakfast. Sun,Mon,Tue, Thur,Fri,Sat   memantine (NAMENDA) 10 MG tablet Take 10 mg by mouth 2 (two) times daily.   Omega-3 Fatty Acids (OMEGA III EPA+DHA) 1000 MG CAPS Take 2,000 mg by mouth 2 (two) times daily.   potassium chloride (KLOR-CON M) 10 MEQ tablet Take 1 tablet (10 mEq total) by mouth 2 (two) times daily.   No facility-administered encounter medications on file as of 09/02/2022.    Review of Systems  Constitutional: Negative.   HENT:   Positive for hearing loss.   Respiratory: Negative.    Cardiovascular: Negative.   Genitourinary: Negative.   Neurological: Negative.   Psychiatric/Behavioral:  Positive for confusion.   All other systems reviewed and are negative.   There were no vitals filed for this visit. There is no height or weight on file to calculate BMI. Physical Exam Vitals reviewed.  Constitutional:      Appearance: Normal appearance. She is obese.  HENT:     Mouth/Throat:     Mouth: Mucous membranes are moist.     Pharynx: Oropharynx is clear.  Eyes:     Extraocular Movements: Extraocular movements intact.     Pupils: Pupils are equal, round, and reactive to light.  Cardiovascular:  Rate and Rhythm: Normal rate and regular rhythm.  Pulmonary:     Effort: Pulmonary effort is normal.     Breath sounds: Normal breath sounds.  Abdominal:     General: Bowel sounds are normal.     Palpations: Abdomen is soft.  Musculoskeletal:     Comments: Ambulates with walker  Neurological:     General: No focal deficit present.     Mental Status: She is alert.     Comments: Oriented to person and place     Labs reviewed: Basic Metabolic Panel: Recent Labs    12/12/21 0000 05/06/22 1200 06/05/22 0600  NA 145 138 139  139  K 4.3 4.4 4.0  4.0  CL 107 101 105  106  CO2 27* 29* 26*  26*  BUN 29* 32* 35*  35*  CREATININE 1.3* 1.8* 1.5*  1.5*  CALCIUM 9.3 9.6 9.0   Liver Function Tests: Recent Labs    10/01/21 0000 11/05/21 0000 05/06/22 1200 06/05/22 0600  AST 13 15 13   --   ALT 9 9 10   --   ALKPHOS 80 89 89 3.9*  ALBUMIN 3.9 3.9 4.3 3.4*   No results for input(s): "LIPASE", "AMYLASE" in the last 8760 hours. No results for input(s): "AMMONIA" in the last 8760 hours. CBC: Recent Labs    10/01/21 0000 11/05/21 0000 05/06/22 1200  WBC 7.4 8.8 9.5  NEUTROABS 5,469.00  --  7,011.00  HGB 12.8 12.6 12.8  HCT 38 37 38  PLT  --  231 227   Cardiac Enzymes: No results for input(s):  "CKTOTAL", "CKMB", "CKMBINDEX", "TROPONINI" in the last 8760 hours. BNP: Invalid input(s): "POCBNP" No results found for: "HGBA1C" Lab Results  Component Value Date   TSH 2.34 06/22/2020   No results found for: "VITAMINB12" No results found for: "FOLATE" No results found for: "IRON", "TIBC", "FERRITIN"  Imaging and Procedures obtained prior to SNF admission: MR BRAIN WO CONTRAST  Result Date: 11/25/2020 CLINICAL DATA:  TIA EXAM: MRI HEAD WITHOUT CONTRAST MRA HEAD WITHOUT CONTRAST TECHNIQUE: Multiplanar, multi-echo pulse sequences of the brain and surrounding structures were acquired without intravenous contrast. Angiographic images of the Circle of Willis were acquired using MRA technique without intravenous contrast. COMPARISON:  Head CT from yesterday FINDINGS: MRI HEAD FINDINGS Brain: No acute infarction, hemorrhage, hydrocephalus, extra-axial collection or mass lesion. Remote bilateral occipital and left parietal cortically based infarcts. Chronic small vessel ischemia in the deep white matter which is mild for age. Nonspecific cerebral volume loss. Vascular: Major flow voids are preserved Skull and upper cervical spine: Posterior scalp swelling. No evidence of bone lesion. Sinuses/Orbits: Bilateral cataract resection.  No evidence of injury Other: Moderate motion artifact to the degree that findings could be obscured. MRA HEAD FINDINGS Significant motion artifact intermittently, especially at the level of the circle-of-Willis. The covered vertebral, basilar, and carotid arteries are patent. A1, M1, and P1 segments are largely obscured by motion artifact. No detected branch occlusion or aneurysm. IMPRESSION: Brain MRI: 1. No acute intracranial finding.  No acute infarct. 2. Chronic small vessel disease and small remote cortical infarcts. 3. Posterior scalp contusion. 4. Moderate motion artifact Intracranial MRA: Motion artifact with multiple nondiagnostic slices. Unremarkable vessels where  visible. Electronically Signed   By: Tiburcio Pea M.D.   On: 11/25/2020 06:39   MR ANGIO HEAD WO CONTRAST  Result Date: 11/25/2020 CLINICAL DATA:  TIA EXAM: MRI HEAD WITHOUT CONTRAST MRA HEAD WITHOUT CONTRAST TECHNIQUE: Multiplanar, multi-echo pulse sequences of the brain and  surrounding structures were acquired without intravenous contrast. Angiographic images of the Circle of Willis were acquired using MRA technique without intravenous contrast. COMPARISON:  Head CT from yesterday FINDINGS: MRI HEAD FINDINGS Brain: No acute infarction, hemorrhage, hydrocephalus, extra-axial collection or mass lesion. Remote bilateral occipital and left parietal cortically based infarcts. Chronic small vessel ischemia in the deep white matter which is mild for age. Nonspecific cerebral volume loss. Vascular: Major flow voids are preserved Skull and upper cervical spine: Posterior scalp swelling. No evidence of bone lesion. Sinuses/Orbits: Bilateral cataract resection.  No evidence of injury Other: Moderate motion artifact to the degree that findings could be obscured. MRA HEAD FINDINGS Significant motion artifact intermittently, especially at the level of the circle-of-Willis. The covered vertebral, basilar, and carotid arteries are patent. A1, M1, and P1 segments are largely obscured by motion artifact. No detected branch occlusion or aneurysm. IMPRESSION: Brain MRI: 1. No acute intracranial finding.  No acute infarct. 2. Chronic small vessel disease and small remote cortical infarcts. 3. Posterior scalp contusion. 4. Moderate motion artifact Intracranial MRA: Motion artifact with multiple nondiagnostic slices. Unremarkable vessels where visible. Electronically Signed   By: Tiburcio Pea M.D.   On: 11/25/2020 06:39   DG Pelvis Portable  Result Date: 11/25/2020 CLINICAL DATA:  Fall at home. EXAM: PORTABLE PELVIS 1-2 VIEWS COMPARISON:  None. FINDINGS: There is no evidence of pelvic fracture or diastasis. No pelvic bone  lesions are seen. Atherosclerosis and osteopenia. IMPRESSION: No acute finding Electronically Signed   By: Tiburcio Pea M.D.   On: 11/25/2020 04:11   DG Abd Portable 1V  Result Date: 11/25/2020 CLINICAL DATA:  Fall at home EXAM: PORTABLE ABDOMEN - 1 VIEW COMPARISON:  None. FINDINGS: Normal bowel gas pattern. No concerning mass effect or calcification. Generalized osteopenia and lumbar spine degeneration. L1 and L2 wedging, chronic at L2 when compared to a 2011 abdominal CT. Given the degree of T12-L1 spurring, L1 wedging is likely also chronic. IMPRESSION: Normal bowel gas pattern. Electronically Signed   By: Tiburcio Pea M.D.   On: 11/25/2020 04:11   EEG adult  Result Date: 11/24/2020 Jefferson Fuel, MD     11/24/2020  8:40 PM Routine EEG Report [name] is a 89 y.o. female with a history of spell who is undergoing an EEG to evaluate for seizures. Report: This EEG was acquired with electrodes placed according to the International 10-20 electrode system (including Fp1, Fp2, F3, F4, C3, C4, P3, P4, O1, O2, T3, T4, T5, T6, A1, A2, Fz, Cz, Pz). The following electrodes were missing or displaced: none. There was no waking occipital dominant rhythm. The best background was 6-7 Hz. This activity is reactive to stimulation. Sleep was identified by K complexes and sleep spindles. There was no focal slowing. There were no definitive interictal epileptiform discharges. There were no electrographic seizures identified. Photic stimulation and hyperventilation were not performed. Impression and clinical correlation: This EEG was obtained while asleep and is abnormal due to mild-to-moderate diffuse slowing. There were no electrographic seizures or definitive epileptiform abnormalities seen during this recording. Bing Neighbors, MD [facility] [phone] If 7pm- 7am, please page neurology on call as listed in AMION.   CT HEAD WO CONTRAST  Result Date: 11/24/2020 CLINICAL DATA:  Altered mental  status, fall EXAM: CT HEAD WITHOUT CONTRAST TECHNIQUE: Contiguous axial images were obtained from the base of the skull through the vertex without intravenous contrast. COMPARISON:  CT head 08/02/2020 FINDINGS: Brain: There is no evidence of acute intracranial hemorrhage,  extra-axial fluid collection, or acute infarct. A remote infarct in the left parietal lobe is unchanged. Mild parenchymal volume loss and chronic white matter microangiopathy are unchanged. The ventricles are stable in size. There is no mass lesion. There is no midline shift. Vascular: There is calcification of the bilateral cavernous ICAs. Skull: Normal. Negative for fracture or focal lesion. Sinuses/Orbits: The paranasal sinuses are clear. The globes and orbits are unremarkable. Other: None. IMPRESSION: 1. No acute intracranial pathology. 2. Unchanged remote left parietal lobe infarct, parenchymal volume loss, and chronic white matter microangiopathy. Electronically Signed   By: Lesia Hausen M.D.   On: 11/24/2020 14:29   CT CERVICAL SPINE WO CONTRAST  Result Date: 11/24/2020 CLINICAL DATA:  Trauma, fall EXAM: CT CERVICAL SPINE WITHOUT CONTRAST TECHNIQUE: Multidetector CT imaging of the cervical spine was performed without intravenous contrast. Multiplanar CT image reconstructions were also generated. COMPARISON:  CT cervical spine 08/02/2020 FINDINGS: Alignment: There is straightened curvature of the cervical spine with slight focal kyphosis centered at C5, similar to the prior study. There is grade 1 anterolisthesis of C3 on C4 and C4 on C5, also unchanged and likely degenerative in nature. There is no evidence of traumatic malalignment. There is no jumped or perched facet. Skull base and vertebrae: Skull base alignment is maintained. Vertebral body heights are preserved. There is no evidence of acute fracture. Soft tissues and spinal canal: No prevertebral fluid or swelling. No visible canal hematoma. Disc levels: There is marked  intervertebral disc space narrowing at C5-C6, similar to the prior study. Degenerative endplate changes also most advanced at this level. There is multilevel facet arthropathy, similar to the prior study. There is mild narrowing of the craniocervical junction. The osseous spinal canal is otherwise patent. Upper chest: There is mosaic attenuation in the lung apices with mild smooth interlobular septal thickening. Other: The left thyroid lobe is surgically absent. The soft tissues are otherwise unremarkable. IMPRESSION: 1. No acute fracture or traumatic malalignment of the cervical spine. 2. Multilevel degenerative changes as above, similar to the prior study. 3. Smooth interlobular septal thickening in the lung apices can be seen with pulmonary interstitial edema. Mosaic attenuation in the lung apices suggests air trapping/small airway disease. Electronically Signed   By: Lesia Hausen M.D.   On: 11/24/2020 14:22   DG Chest Portable 1 View  Result Date: 11/24/2020 CLINICAL DATA:  Syncope. EXAM: PORTABLE CHEST 1 VIEW COMPARISON:  Jul 15, 2019 FINDINGS: Stable cardiomegaly. The hila and mediastinum are unremarkable. No pneumothorax. No overt edema or focal infiltrate. Nodular density projected over the right base is favored to represent confluence of shadows or atelectasis. No other acute abnormalities. IMPRESSION: A nodular density projected over the right base is favored to represent confluence of shadows or atelectasis. Recommend a PA and lateral chest x-Damore before discharge. No acute abnormalities are seen. Electronically Signed   By: Gerome Sam III M.D.   On: 11/24/2020 13:45    Assessment/Plan 1. Stage 3a chronic kidney disease (HCC) Creatinine stable at 1.5  2. Chronic congestive heart failure, unspecified heart failure type (HCC) EF at 60%.  Occasional cough.  Taking furosemide  3. Gait instability Using walker..  Has pretty good safety awareness  4. Dementia with behavioral disturbance  (HCC) Continue with memantine  5. Primary hypertension Blood pressure well-controlled on benazepril amlodipine  Family/ staff Communication:   Labs/tests ordered  Bertram Millard. Hyacinth Meeker, MD [facility] [address] [place], [place] [address] Office [phone]

## 2022-09-17 ENCOUNTER — Encounter: Payer: Self-pay | Admitting: Nurse Practitioner

## 2022-09-17 ENCOUNTER — Non-Acute Institutional Stay (SKILLED_NURSING_FACILITY): Payer: Medicare Other | Admitting: Nurse Practitioner

## 2022-09-17 DIAGNOSIS — I10 Essential (primary) hypertension: Secondary | ICD-10-CM

## 2022-09-17 DIAGNOSIS — N1831 Chronic kidney disease, stage 3a: Secondary | ICD-10-CM | POA: Diagnosis not present

## 2022-09-17 DIAGNOSIS — E039 Hypothyroidism, unspecified: Secondary | ICD-10-CM

## 2022-09-17 DIAGNOSIS — G609 Hereditary and idiopathic neuropathy, unspecified: Secondary | ICD-10-CM

## 2022-09-17 DIAGNOSIS — R2681 Unsteadiness on feet: Secondary | ICD-10-CM

## 2022-09-17 DIAGNOSIS — R569 Unspecified convulsions: Secondary | ICD-10-CM

## 2022-09-17 DIAGNOSIS — F0390 Unspecified dementia without behavioral disturbance: Secondary | ICD-10-CM

## 2022-09-17 DIAGNOSIS — M109 Gout, unspecified: Secondary | ICD-10-CM | POA: Diagnosis not present

## 2022-09-17 DIAGNOSIS — G459 Transient cerebral ischemic attack, unspecified: Secondary | ICD-10-CM

## 2022-09-17 DIAGNOSIS — M159 Polyosteoarthritis, unspecified: Secondary | ICD-10-CM

## 2022-09-17 DIAGNOSIS — Z8673 Personal history of transient ischemic attack (TIA), and cerebral infarction without residual deficits: Secondary | ICD-10-CM

## 2022-09-17 NOTE — Assessment & Plan Note (Signed)
stable, on Allopurinol 

## 2022-09-17 NOTE — Assessment & Plan Note (Signed)
07/15/19 CT head Mild generalized parenchymal atrophy and chronic small vessel ischemic disease, tolerated Memantine well. 06/22/22 MMSE 17/30

## 2022-09-17 NOTE — Assessment & Plan Note (Signed)
T5, T8, L3 compression fxs. Takes, Tylenol, Gabapentin for pain, off Tramadol, DDD per CT head/cervical spine 08/02/20  

## 2022-09-17 NOTE — Assessment & Plan Note (Signed)
No further occurrence,  likely post fall, off Tramadol, not taking anti seizure meds.

## 2022-09-17 NOTE — Assessment & Plan Note (Signed)
blood pressure is controlled on Benazepril, Amlodipine.  

## 2022-09-17 NOTE — Assessment & Plan Note (Signed)
09/30/21 CXR mild congestive heart failure, CXR 10/25/21 showed CHF. Takes Furosemide, compensated clinically. Echo 06/08/08 EF 60%, DOE/cough on and off. 

## 2022-09-17 NOTE — Assessment & Plan Note (Signed)
Bun/creat 35/1.45 06/05/22 

## 2022-09-17 NOTE — Assessment & Plan Note (Signed)
uses walker to ambulate slowly.     

## 2022-09-17 NOTE — Assessment & Plan Note (Signed)
stable, on Levothyroxine. TSH 3.21 07/03/22  

## 2022-09-17 NOTE — Assessment & Plan Note (Signed)
taking Gabapentin, Vit B12 

## 2022-09-17 NOTE — Assessment & Plan Note (Signed)
Hx of CVA per CT head 08/02/20 Remote infarcts within the a occipital cortices bilaterally again noted. Interval development of remote infarcts within the left parietal lobe and periventricular white matte. On ASA 

## 2022-09-17 NOTE — Progress Notes (Signed)
Location:   [facility] Room Number: 54 Place of Service:  SNF (31) Provider: [facility] Freyja Govea NP  Mahlon Gammon, MD  Patient Care Team: Mahlon Gammon, MD as PCP - General (Internal Medicine) Jermichael Belmares X, NP as Nurse Practitioner (Internal Medicine) Mahlon Gammon, MD as Consulting Physician (Internal Medicine) Mahlon Gammon, MD (Internal Medicine)  Extended Emergency Contact Information Primary Emergency Contact: Mechele Claude of [place] Home Phone: [phone] Relation: Son Secondary Emergency Contact: Slates,[name] Mobile Phone: [phone] Relation: Son  Code Status:  DNR Goals of care: Advanced Directive information    06/12/2022   10:15 AM  Advanced Directives  Does Patient Have a Medical Advance Directive? Yes  Type of Estate agent of [place];Out of facility DNR (pink MOST or yellow form)  Does patient want to make changes to medical advance directive? No - Patient declined  Copy of Healthcare Power of Attorney in Chart? Yes - validated most recent copy scanned in chart (See row information)  Pre-existing out of facility DNR order (yellow form or pink MOST form) Pink MOST/Yellow Form most recent copy in chart - Physician notified to receive inpatient order     Chief Complaint  Patient presents with   Medical Management of Chronic Issues    HPI:  Pt is a 89 y.o. female seen today for medical management of chronic diseases.    CHF, 09/30/21 CXR mild congestive heart failure, CXR 10/25/21 showed CHF. Takes Furosemide, compensated clinically. Echo 06/08/08 EF 60%, DOE/cough on and off.             Peripheral neuropathy, taking Gabapentin, Vit B12 Gait abnormality, uses walker to ambulate slowly.       Hx of TIA. Allergic to statin. On ASA.  HPOA desired not hospital evaluation. Seizure like activity, likely post fall, off Tramadol, not taking anti seizure meds.  OA: T5, T8, L3 compression fxs. Takes, Tylenol, Gabapentin for  pain, off Tramadol, DDD per CT head/cervical spine 08/02/20              Hx of CVA per CT head 08/02/20 Remote infarcts within the a occipital cortices bilaterally again noted. Interval development of remote infarcts within the left parietal lobe and periventricular white matte. On ASA            Dementia, 07/15/19 CT head Mild generalized parenchymal atrophy and chronic small vessel ischemic disease, tolerated Memantine well. 06/22/22 MMSE 17/30             HTN, blood pressure is controlled on Benazepril, Amlodipine.              CKD Bun/creat 35/1.45 06/05/22             Gout stable, on Allopurinol                   Hypothyroidism, stable, on Levothyroxine. TSH 3.21 07/03/22              Abnormal CXR, suggested CT chest, not candidate for workup   Past Medical History:  Diagnosis Date   Cognitive changes    Dementia (HCC)    Depression    Gout    Hypertension    Hypothyroidism    Peripheral neuropathy    History reviewed. No pertinent surgical history.  Allergies  Allergen Reactions   Actonel [Risedronate Sodium]    Actonel [Risedronate] Other (See Comments)    "Allergic," per [facility]   Hct [Hydrochlorothiazide]    Hydrochlorothiazide  Other (See Comments)    "Allergic," per [facility]   Lipitor [Atorvastatin Calcium]    Lipitor [Atorvastatin] Other (See Comments)    "Allergic," per [facility]   Neomycin Other (See Comments)    Unknown "been so long ago, a Dermatologist told me"   Neomycin Other (See Comments)    "Allergic," per [facility]   Polysporin [Bacitracin-Polymyxin B]    Polysporin [Bacitracin-Polymyxin B] Other (See Comments)    "Allergic," per [facility]   Prolia [Denosumab]    Prolia [Denosumab] Other (See Comments)   Sulfa Antibiotics    Sulfa Antibiotics Other (See Comments)    "Allergic," per [facility]   Zocor [Simvastatin]    Zocor [Simvastatin] Other (See Comments)    "Allergic," per [facility]    Allergies as of 09/17/2022       Reactions   Actonel [risedronate Sodium]    Actonel [risedronate]  Other (See Comments)   "Allergic," per [facility]   Hct [hydrochlorothiazide]    Hydrochlorothiazide Other (See Comments)   "Allergic," per [facility]   Lipitor [atorvastatin Calcium]    Lipitor [atorvastatin] Other (See Comments)   "Allergic," per [facility]   Neomycin Other (See Comments)   Unknown "been so long ago, a Dermatologist told me"   Neomycin Other (See Comments)   "Allergic," per [facility]   Polysporin [bacitracin-polymyxin B]    Polysporin [bacitracin-polymyxin B] Other (See Comments)   "Allergic," per [facility]   Prolia [denosumab]    Prolia [denosumab] Other (See Comments)   Sulfa Antibiotics    Sulfa Antibiotics Other (See Comments)   "Allergic," per [facility]   Zocor [simvastatin]    Zocor [simvastatin] Other (See Comments)   "Allergic," per [facility]        Medication List        Accurate as of September 17, 2022  4:06 PM. If you have any questions, ask your nurse or doctor.          acetaminophen 325 MG tablet Commonly known as: TYLENOL Take 650 mg by mouth in the morning, at noon, and at bedtime.   allopurinol 100 MG tablet Commonly known as: ZYLOPRIM Take 100 mg by mouth daily.   amLODipine 2.5 MG tablet Commonly known as: NORVASC Take 2.5 mg by mouth daily.   aspirin 81 MG chewable tablet Chew 81 mg by mouth daily.   benazepril 10 MG tablet Commonly known as: LOTENSIN Take 10 mg by mouth in the morning.   Calcium Carb-Cholecalciferol 500-400 MG-UNIT Tabs Take 2 tablets by mouth daily.   Co Q-10 100 MG Caps Take 200 mg by mouth in the morning.   furosemide 20 MG tablet Commonly known as: LASIX Take 1 tablet (20 mg total) by mouth daily.   gabapentin 100 MG capsule Commonly known as: NEURONTIN Take 200 mg by mouth at bedtime.   levothyroxine 100 MCG tablet Commonly known as: SYNTHROID Take 100 mcg by mouth once a week. On Wednesday   levothyroxine 50 MCG tablet Commonly known as: SYNTHROID Take 50 mcg by mouth daily before breakfast. Sun,Mon,Tue, Thur,Fri,Sat    memantine 10 MG tablet Commonly known as: NAMENDA Take 10 mg by mouth 2 (two) times daily.   Omega III EPA+DHA 1000 MG Caps Take 2,000 mg by mouth 2 (two) times daily.   potassium chloride 10 MEQ tablet Commonly known as: KLOR-CON M Take 1 tablet (10 mEq total) by mouth 2 (two) times daily.   Vitamin D 50 MCG (2000 UT) Caps Take 2,000 Units by mouth daily.  Review of Systems  Constitutional:  Negative for activity change, fatigue and fever.  HENT:  Positive for hearing loss. Negative for congestion and voice change.   Eyes:  Negative for visual disturbance.  Respiratory:  Positive for shortness of breath. Negative for cough and wheezing.        DOE  Cardiovascular:  Positive for leg swelling.  Gastrointestinal:  Negative for abdominal pain and constipation.  Genitourinary:  Negative for dysuria and urgency.  Musculoskeletal:  Positive for arthralgias, back pain and gait problem.       Chronic, aches.   Skin:  Negative for color change.  Neurological:  Negative for speech difficulty, weakness and light-headedness.  Psychiatric/Behavioral:  Positive for confusion. Negative for behavioral problems and sleep disturbance. The patient is not nervous/anxious.     Immunization History  Administered Date(s) Administered   Fluad Quad(high Dose 65+) 01/01/2022   Influenza, High Dose Seasonal PF 12/11/2018, 01/01/2022   Influenza-Unspecified 03/24/2018, 12/21/2019, 12/26/2020   Moderna Covid-19 Vaccine Bivalent Booster 62yrs & up 01/09/2022   Moderna SARS-COV2 Booster Vaccination 02/20/2021, 07/26/2021   Moderna Sars-Covid-2 Vaccination 03/12/2019, 04/09/2019, 04/21/2019, 01/17/2020, 08/07/2020   Pneumococcal Conjugate-13 09/09/2012, 10/28/2013   Pneumococcal Polysaccharide-23 04/02/2009, 04/16/2009   Tdap 07/15/2019   Zoster Recombinant(Shingrix) 06/20/2022   Zoster, Live 04/16/2009   Zoster, Unspecified 04/16/2009   Pertinent  Health Maintenance Due  Topic Date Due    DEXA SCAN  07/02/2023 (Originally 01/08/1992)   INFLUENZA VACCINE  10/09/2022      01/07/2022    9:04 AM 02/06/2022   10:48 AM 04/21/2022    3:50 PM 06/12/2022   10:12 AM 06/12/2022   12:58 PM  Fall Risk  Falls in the past year? 0 0 0 0 1  Was there an injury with Fall? 0 0 0 0 0  Fall Risk Category Calculator 0 0 0 0 2  Fall Risk Category (Retired) Low Low     (RETIRED) Patient Fall Risk Level Moderate fall risk Moderate fall risk     Patient at Risk for Falls Due to History of fall(s) History of fall(s) No Fall Risks No Fall Risks History of fall(s);Impaired balance/gait;Impaired mobility  Fall risk Follow up Falls evaluation completed Falls evaluation completed Falls evaluation completed Falls evaluation completed Falls evaluation completed;Education provided;Falls prevention discussed;Follow up appointment   Functional Status Survey:    Vitals:   09/17/22 1254  BP: 122/64  Pulse: 82  Resp: 19  Temp: (!) 97 F (36.1 C)  SpO2: 96%  Weight: 172 lb 14.4 oz (78.4 kg)   Body mass index is 29.68 kg/m. Physical Exam Vitals and nursing note reviewed.  HENT:     Head: Normocephalic and atraumatic.     Nose: Nose normal.     Mouth/Throat:     Mouth: Mucous membranes are moist.  Eyes:     Extraocular Movements: Extraocular movements intact.     Conjunctiva/sclera: Conjunctivae normal.     Pupils: Pupils are equal, round, and reactive to light.  Cardiovascular:     Rate and Rhythm: Normal rate and regular rhythm.     Heart sounds: No murmur heard.    Comments: Weak dorsalis pedis pulses R+L from previous examination.  Pulmonary:     Effort: Pulmonary effort is normal.     Breath sounds: Rales present. No wheezing or rhonchi.     Comments: Bibasilar rales.  Abdominal:     General: Bowel sounds are normal.     Palpations: Abdomen is soft.  Tenderness: There is no abdominal tenderness.  Musculoskeletal:        General: No tenderness.     Cervical back: Normal range of  motion and neck supple.     Right lower leg: Edema present.     Left lower leg: Edema present.     Comments: trace edema BLE. No pain in spine, shoulders, hips, knees today.   Skin:    General: Skin is warm and dry.  Neurological:     General: No focal deficit present.     Mental Status: She is alert. Mental status is at baseline.     Motor: No weakness.     Coordination: Coordination normal.     Gait: Gait abnormal.  Psychiatric:        Mood and Affect: Mood normal.        Behavior: Behavior normal.     Labs reviewed: Recent Labs    12/12/21 0000 05/06/22 1200 06/05/22 0600  NA 145 138 139  139  K 4.3 4.4 4.0  4.0  CL 107 101 105  106  CO2 27* 29* 26*  26*  BUN 29* 32* 35*  35*  CREATININE 1.3* 1.8* 1.5*  1.5*  CALCIUM 9.3 9.6 9.0   Recent Labs    10/01/21 0000 11/05/21 0000 05/06/22 1200 06/05/22 0600  AST 13 15 13   --   ALT 9 9 10   --   ALKPHOS 80 89 89 3.9*  ALBUMIN 3.9 3.9 4.3 3.4*   Recent Labs    10/01/21 0000 11/05/21 0000 05/06/22 1200  WBC 7.4 8.8 9.5  NEUTROABS 5,469.00  --  7,011.00  HGB 12.8 12.6 12.8  HCT 38 37 38  PLT  --  231 227   Lab Results  Component Value Date   TSH 2.34 06/22/2020   No results found for: "HGBA1C" No results found for: "CHOL", "HDL", "LDLCALC", "LDLDIRECT", "TRIG", "CHOLHDL"  Significant Diagnostic Results in last 30 days:  No results found.  Assessment/Plan  HTN (hypertension) blood pressure is controlled on Benazepril, Amlodipine.   CKD (chronic kidney disease) stage 3, GFR 30-59 ml/min (HCC) Bun/creat 35/1.45 06/05/22  Gout stable, on Allopurinol       Hypothyroidism  stable, on Levothyroxine. TSH 3.21 07/03/22   Senile dementia (HCC) 07/15/19 CT head Mild generalized parenchymal atrophy and chronic small vessel ischemic disease, tolerated Memantine well. 06/22/22 MMSE 17/30  History of CVA (cerebrovascular accident) Hx of CVA per CT head 08/02/20 Remote infarcts within the a occipital cortices  bilaterally again noted. Interval development of remote infarcts within the left parietal lobe and periventricular white matte. On ASA  Generalized osteoarthritis of multiple sites  T5, T8, L3 compression fxs. Takes, Tylenol, Gabapentin for pain, off Tramadol, DDD per CT head/cervical spine 08/02/20   Seizure-like activity (HCC) No further occurrence,  likely post fall, off Tramadol, not taking anti seizure meds.   TIA (transient ischemic attack)  Allergic to statin. On ASA.  HPOA desired not hospital evaluation.  Gait instability uses walker to ambulate slowly.    Peripheral neuropathy taking Gabapentin, Vit B12  Congestive heart failure (CHF) (HCC)  09/30/21 CXR mild congestive heart failure, CXR 10/25/21 showed CHF. Takes Furosemide, compensated clinically. Echo 06/08/08 EF 60%, DOE/cough on and off.   Family/ staff Communication: plan of care reviewed with the patient and charge nurse.   Labs/tests ordered:  none   Time spend 35 minutes.

## 2022-09-17 NOTE — Assessment & Plan Note (Signed)
Allergic to statin. On ASA.  HPOA desired not hospital evaluation 

## 2022-10-17 ENCOUNTER — Encounter: Payer: Self-pay | Admitting: Family Medicine

## 2022-10-20 ENCOUNTER — Encounter: Payer: Self-pay | Admitting: Family Medicine

## 2022-10-27 ENCOUNTER — Encounter: Payer: Self-pay | Admitting: Nurse Practitioner

## 2022-10-27 ENCOUNTER — Non-Acute Institutional Stay (SKILLED_NURSING_FACILITY): Payer: Medicare Other | Admitting: Nurse Practitioner

## 2022-10-27 DIAGNOSIS — M109 Gout, unspecified: Secondary | ICD-10-CM

## 2022-10-27 DIAGNOSIS — R2681 Unsteadiness on feet: Secondary | ICD-10-CM

## 2022-10-27 DIAGNOSIS — N1831 Chronic kidney disease, stage 3a: Secondary | ICD-10-CM

## 2022-10-27 DIAGNOSIS — E039 Hypothyroidism, unspecified: Secondary | ICD-10-CM

## 2022-10-27 DIAGNOSIS — M159 Polyosteoarthritis, unspecified: Secondary | ICD-10-CM

## 2022-10-27 DIAGNOSIS — Z8673 Personal history of transient ischemic attack (TIA), and cerebral infarction without residual deficits: Secondary | ICD-10-CM

## 2022-10-27 DIAGNOSIS — F03918 Unspecified dementia, unspecified severity, with other behavioral disturbance: Secondary | ICD-10-CM

## 2022-10-27 DIAGNOSIS — I10 Essential (primary) hypertension: Secondary | ICD-10-CM

## 2022-10-27 NOTE — Assessment & Plan Note (Signed)
07/15/19 CT head Mild generalized parenchymal atrophy and chronic small vessel ischemic disease, tolerated Memantine well. 06/22/22 MMSE 17/30

## 2022-10-27 NOTE — Assessment & Plan Note (Signed)
stable, on Levothyroxine. TSH 3.21 07/03/22  

## 2022-10-27 NOTE — Assessment & Plan Note (Signed)
uses walker to ambulate slowly.     

## 2022-10-27 NOTE — Assessment & Plan Note (Signed)
Bun/creat 35/1.45 06/05/22 

## 2022-10-27 NOTE — Assessment & Plan Note (Signed)
09/30/21 CXR mild congestive heart failure, CXR 10/25/21 showed CHF. Takes Furosemide, compensated clinically. Echo 06/08/08 EF 60%, DOE/cough on and off.

## 2022-10-27 NOTE — Assessment & Plan Note (Signed)
T5, T8, L3 compression fxs. Takes, Tylenol, Gabapentin for pain, off Tramadol, DDD per CT head/cervical spine 08/02/20  

## 2022-10-27 NOTE — Assessment & Plan Note (Signed)
blood pressure is controlled on Benazepril, Amlodipine.  

## 2022-10-27 NOTE — Assessment & Plan Note (Signed)
stable, on Allopurinol       

## 2022-10-27 NOTE — Assessment & Plan Note (Signed)
Hx of CVA per CT head 08/02/20 Remote infarcts within the a occipital cortices bilaterally again noted. Interval development of remote infarcts within the left parietal lobe and periventricular white matte. On ASA 

## 2022-10-27 NOTE — Progress Notes (Unsigned)
Location:   [facility] Room Number: 31-A Place of Service:  SNF (31) Provider:  Nemiah Bubar, NP  PCP: Mahlon Gammon, MD  Patient Care Team: Mahlon Gammon, MD as PCP - General (Internal Medicine) Tabias Swayze X, NP as Nurse Practitioner (Internal Medicine) Mahlon Gammon, MD as Consulting Physician (Internal Medicine) Mahlon Gammon, MD (Internal Medicine)  Extended Emergency Contact Information Primary Emergency Contact: Mechele Claude of [place] Home Phone: [phone] Relation: Son Secondary Emergency Contact: Weathers,[name] Mobile Phone: [phone] Relation: Son  Code Status:  DNR Goals of care: Advanced Directive information    10/27/2022    9:28 AM  Advanced Directives  Does Patient Have a Medical Advance Directive? Yes  Type of Estate agent of [place];Out of facility DNR (pink MOST or yellow form)  Does patient want to make changes to medical advance directive? No - Patient declined  Copy of Healthcare Power of Attorney in Chart? Yes - validated most recent copy scanned in chart (See row information)     Chief Complaint  Patient presents with   Medical Management of Chronic Issues    Routine Visit.    Immunizations    Discuss the need for Covid Booster, Shingrix vaccine, and Influenza vaccine.    HPI:  Pt is a 89 y.o. female seen today for medical management of chronic diseases.   CHF, 09/30/21 CXR mild congestive heart failure, CXR 10/25/21 showed CHF. Takes Furosemide, compensated clinically. Echo 06/08/08 EF 60%, DOE/cough on and off.             Peripheral neuropathy, taking Gabapentin, Vit B12 Gait abnormality, uses walker to ambulate slowly.       Hx of TIA. Allergic to statin. On ASA.  HPOA desired no hospital evaluation. Seizure like activity, likely post fall, off Tramadol, not taking anti seizure meds.  OA: T5, T8, L3 compression fxs. Takes, Tylenol, Gabapentin for pain, off Tramadol, DDD per CT  head/cervical spine 08/02/20              Hx of CVA per CT head 08/02/20 Remote infarcts within the a occipital cortices bilaterally again noted. Interval development of remote infarcts within the left parietal lobe and periventricular white matte. On ASA            Dementia, 07/15/19 CT head Mild generalized parenchymal atrophy and chronic small vessel ischemic disease, tolerated Memantine well. 06/22/22 MMSE 17/30             HTN, blood pressure is controlled on Benazepril, Amlodipine.              CKD Bun/creat 35/1.45 06/05/22             Gout stable, on Allopurinol                   Hypothyroidism, stable, on Levothyroxine. TSH 3.21 07/03/22              Abnormal CXR, suggested CT chest, not candidate for workup       Past Medical History:  Diagnosis Date   Cognitive changes    Dementia (HCC)    Depression    Gout    Hypertension    Hypothyroidism    Peripheral neuropathy    History reviewed. No pertinent surgical history.  Allergies  Allergen Reactions   Actonel [Risedronate Sodium]    Actonel [Risedronate] Other (See Comments)    "Allergic," per [facility]   Hct [Hydrochlorothiazide]  Hydrochlorothiazide Other (See Comments)    "Allergic," per [facility]   Lipitor [Atorvastatin Calcium]    Lipitor [Atorvastatin] Other (See Comments)    "Allergic," per [facility]   Neomycin Other (See Comments)    Unknown "been so long ago, a Dermatologist told me"   Neomycin Other (See Comments)    "Allergic," per [facility]   Polysporin [Bacitracin-Polymyxin B]    Polysporin [Bacitracin-Polymyxin B] Other (See Comments)    "Allergic," per [facility]   Prolia [Denosumab]    Prolia [Denosumab] Other (See Comments)   Sulfa Antibiotics    Sulfa Antibiotics Other (See Comments)    "Allergic," per [facility]   Zocor [Simvastatin]    Zocor [Simvastatin] Other (See Comments)    "Allergic," per [facility]    Allergies as of 10/27/2022       Reactions   Actonel [risedronate Sodium]    Actonel [risedronate] Other (See Comments)    "Allergic," per [facility]   Hct [hydrochlorothiazide]    Hydrochlorothiazide Other (See Comments)   "Allergic," per [facility]   Lipitor [atorvastatin Calcium]    Lipitor [atorvastatin] Other (See Comments)   "Allergic," per [facility]   Neomycin Other (See Comments)   Unknown "been so long ago, a Dermatologist told me"   Neomycin Other (See Comments)   "Allergic," per [facility]   Polysporin [bacitracin-polymyxin B]    Polysporin [bacitracin-polymyxin B] Other (See Comments)   "Allergic," per [facility]   Prolia [denosumab]    Prolia [denosumab] Other (See Comments)   Sulfa Antibiotics    Sulfa Antibiotics Other (See Comments)   "Allergic," per [facility]   Zocor [simvastatin]    Zocor [simvastatin] Other (See Comments)   "Allergic," per [facility]        Medication List        Accurate as of October 27, 2022 11:59 PM. If you have any questions, ask your nurse or doctor.          STOP taking these medications    Co Q-10 100 MG Caps Stopped by: Cailean Heacock X Baylin Gamblin   Omega III EPA+DHA 1000 MG Caps Stopped by: Julies Carmickle X Liridona Mashaw       TAKE these medications    acetaminophen 325 MG tablet Commonly known as: TYLENOL Take 650 mg by mouth in the morning, at noon, and at bedtime.   allopurinol 100 MG tablet Commonly known as: ZYLOPRIM Take 100 mg by mouth daily.   amLODipine 2.5 MG tablet Commonly known as: NORVASC Take 2.5 mg by mouth daily.   aspirin 81 MG chewable tablet Chew 81 mg by mouth daily.   benazepril 5 MG tablet Commonly known as: LOTENSIN Take 5 mg by mouth daily. What changed: Another medication with the same name was removed. Continue taking this medication, and follow the directions you see here. Changed by: Everardo Voris X Moxon Messler   Calcium Carb-Cholecalciferol 500-400 MG-UNIT Tabs Take 2 tablets by mouth daily.   furosemide 40 MG tablet Commonly known as: LASIX Take 40 mg by mouth daily. What changed: Another medication with the same name was removed. Continue taking this medication, and follow the  directions you see here. Changed by: Emanual Lamountain X Zannah Melucci   gabapentin 100 MG capsule Commonly known as: NEURONTIN Take 200 mg by mouth at bedtime.   levothyroxine 100 MCG tablet Commonly known as: SYNTHROID Take 100 mcg by mouth once a week. On Wednesday   levothyroxine 50 MCG tablet Commonly known as: SYNTHROID Take 50 mcg by mouth daily before breakfast. Sun,Mon,Tue, Thur,Fri,Sat   memantine 10 MG tablet Commonly  known as: NAMENDA Take 10 mg by mouth 2 (two) times daily.   potassium chloride 10 MEQ tablet Commonly known as: KLOR-CON M Take 1 tablet (10 mEq total) by mouth 2 (two) times daily.   Vitamin D 50 MCG (2000 UT) Caps Take 2,000 Units by mouth daily.        Review of Systems  Constitutional:  Negative for activity change, fatigue and fever.  HENT:  Positive for hearing loss. Negative for congestion and voice change.   Eyes:  Negative for visual disturbance.  Respiratory:  Positive for shortness of breath. Negative for cough and wheezing.        DOE  Cardiovascular:  Positive for leg swelling.  Gastrointestinal:  Negative for abdominal pain and constipation.  Genitourinary:  Negative for dysuria and urgency.  Musculoskeletal:  Positive for arthralgias, back pain and gait problem.       Chronic, aches.   Skin:  Negative for color change.  Neurological:  Negative for speech difficulty, weakness and light-headedness.  Psychiatric/Behavioral:  Positive for confusion. Negative for behavioral problems and sleep disturbance. The patient is not nervous/anxious.     Immunization History  Administered Date(s) Administered   Fluad Quad(high Dose 65+) 01/01/2022   Influenza, High Dose Seasonal PF 12/11/2018, 01/01/2022   Influenza-Unspecified 03/24/2018, 12/21/2019, 12/26/2020   Moderna Covid-19 Vaccine Bivalent Booster 19yrs & up 01/09/2022   Moderna SARS-COV2 Booster Vaccination 02/20/2021, 07/26/2021   Moderna Sars-Covid-2 Vaccination 03/12/2019, 04/09/2019, 04/21/2019,  01/17/2020, 08/07/2020   Pneumococcal Conjugate-13 09/09/2012, 10/28/2013   Pneumococcal Polysaccharide-23 04/02/2009, 04/16/2009   Tdap 07/15/2019   Zoster Recombinant(Shingrix) 06/20/2022   Zoster, Live 04/16/2009   Zoster, Unspecified 04/16/2009   Pertinent  Health Maintenance Due  Topic Date Due   INFLUENZA VACCINE  10/09/2022   DEXA SCAN  07/02/2023 (Originally 01/[birth year]/1992)      01/07/2022    9:04 AM 02/06/2022   10:48 AM 04/21/2022    3:50 PM 06/12/2022   10:12 AM 06/12/2022   12:58 PM  Fall Risk  Falls in the past year? 0 0 0 0 1  Was there an injury with Fall? 0 0 0 0 0  Fall Risk Category Calculator 0 0 0 0 2  Fall Risk Category (Retired) Low Low     (RETIRED) Patient Fall Risk Level Moderate fall risk Moderate fall risk     Patient at Risk for Falls Due to History of fall(s) History of fall(s) No Fall Risks No Fall Risks History of fall(s);Impaired balance/gait;Impaired mobility  Fall risk Follow up Falls evaluation completed Falls evaluation completed Falls evaluation completed Falls evaluation completed Falls evaluation completed;Education provided;Falls prevention discussed;Follow up appointment   Functional Status Survey:    Vitals:   10/27/22 0924  BP: 130/72  Pulse: 71  Resp: 16  Temp: (!) 97.3 F (36.3 C)  SpO2: 99%  Weight: 172 lb 9.6 oz (78.3 kg)  Height: 5\' 4"  (1.626 m)   Body mass index is 29.63 kg/m. Physical Exam Vitals and nursing note reviewed.  HENT:     Head: Normocephalic and atraumatic.     Nose: Nose normal.     Mouth/Throat:     Mouth: Mucous membranes are moist.  Eyes:     Extraocular Movements: Extraocular movements intact.     Conjunctiva/sclera: Conjunctivae normal.     Pupils: Pupils are equal, round, and reactive to light.  Cardiovascular:     Rate and Rhythm: Normal rate and regular rhythm.     Heart sounds: No murmur heard.  Comments: Weak dorsalis pedis pulses R+L from previous examination.  Pulmonary:     Effort:  Pulmonary effort is normal.     Breath sounds: Rales present. No wheezing or rhonchi.     Comments: Bibasilar rales.  Abdominal:     General: Bowel sounds are normal.     Palpations: Abdomen is soft.     Tenderness: There is no abdominal tenderness.  Musculoskeletal:        General: No tenderness.     Cervical back: Normal range of motion and neck supple.     Right lower leg: Edema present.     Left lower leg: Edema present.     Comments: trace edema BLE.   Skin:    General: Skin is warm and dry.  Neurological:     General: No focal deficit present.     Mental Status: She is alert. Mental status is at baseline.     Motor: No weakness.     Coordination: Coordination normal.     Gait: Gait abnormal.  Psychiatric:        Mood and Affect: Mood normal.        Behavior: Behavior normal.     Labs reviewed: Recent Labs    12/12/21 0000 05/06/22 1200 06/05/22 0600  NA 145 138 139  139  K 4.3 4.4 4.0  4.0  CL 107 101 105  106  CO2 27* 29* 26*  26*  BUN 29* 32* 35*  35*  CREATININE 1.3* 1.8* 1.5*  1.5*  CALCIUM 9.3 9.6 9.0   Recent Labs    11/05/21 0000 05/06/22 1200 06/05/22 0600  AST 15 13  --   ALT 9 10  --   ALKPHOS 89 89 3.9*  ALBUMIN 3.9 4.3 3.4*   Recent Labs    11/05/21 0000 05/06/22 1200  WBC 8.8 9.5  NEUTROABS  --  7,011.00  HGB 12.6 12.8  HCT 37 38  PLT 231 227   Lab Results  Component Value Date   TSH 2.34 06/22/2020   No results found for: "HGBA1C" No results found for: "CHOL", "HDL", "LDLCALC", "LDLDIRECT", "TRIG", "CHOLHDL"  Significant Diagnostic Results in last 30 days:  No results found.  Assessment/Plan There are no diagnoses linked to this encounter.   Family/ staff Communication:   Labs/tests ordered:

## 2022-11-17 ENCOUNTER — Encounter: Payer: Self-pay | Admitting: Sports Medicine

## 2022-11-17 NOTE — Progress Notes (Unsigned)
This encounter was created in error - please disregard.

## 2022-11-18 ENCOUNTER — Non-Acute Institutional Stay (SKILLED_NURSING_FACILITY): Payer: Self-pay | Admitting: Nurse Practitioner

## 2022-11-18 DIAGNOSIS — Z8673 Personal history of transient ischemic attack (TIA), and cerebral infarction without residual deficits: Secondary | ICD-10-CM

## 2022-11-18 DIAGNOSIS — M109 Gout, unspecified: Secondary | ICD-10-CM

## 2022-11-18 DIAGNOSIS — G459 Transient cerebral ischemic attack, unspecified: Secondary | ICD-10-CM

## 2022-11-18 DIAGNOSIS — M159 Polyosteoarthritis, unspecified: Secondary | ICD-10-CM

## 2022-11-18 DIAGNOSIS — U071 COVID-19: Secondary | ICD-10-CM

## 2022-11-18 DIAGNOSIS — N1831 Chronic kidney disease, stage 3a: Secondary | ICD-10-CM

## 2022-11-18 DIAGNOSIS — G609 Hereditary and idiopathic neuropathy, unspecified: Secondary | ICD-10-CM

## 2022-11-18 DIAGNOSIS — I509 Heart failure, unspecified: Secondary | ICD-10-CM | POA: Diagnosis not present

## 2022-11-18 DIAGNOSIS — I10 Essential (primary) hypertension: Secondary | ICD-10-CM

## 2022-11-18 DIAGNOSIS — F0390 Unspecified dementia without behavioral disturbance: Secondary | ICD-10-CM

## 2022-11-18 DIAGNOSIS — R569 Unspecified convulsions: Secondary | ICD-10-CM

## 2022-11-18 NOTE — Assessment & Plan Note (Signed)
Hx of CVA per CT head 08/02/20 Remote infarcts within the a occipital cortices bilaterally again noted. Interval development of remote infarcts within the left parietal lobe and periventricular white matte. On ASA 

## 2022-11-18 NOTE — Assessment & Plan Note (Signed)
blood pressure is controlled on Benazepril, Amlodipine.  

## 2022-11-18 NOTE — Assessment & Plan Note (Signed)
on Allopurinol  

## 2022-11-18 NOTE — Assessment & Plan Note (Signed)
/  24/23 CXR mild congestive heart failure, CXR 10/25/21 showed CHF. Takes Furosemide, compensated clinically. Echo 06/08/08 EF 60%, DOE/cough on and off.

## 2022-11-18 NOTE — Assessment & Plan Note (Signed)
likely post fall, off Tramadol, not taking anti seizure meds.  

## 2022-11-18 NOTE — Assessment & Plan Note (Signed)
T5, T8, L3 compression fxs. Takes, Tylenol, Gabapentin for pain, off Tramadol, DDD per CT head/cervical spine 08/02/20  

## 2022-11-18 NOTE — Assessment & Plan Note (Signed)
Bun/creat 35/1.45 06/05/22 

## 2022-11-18 NOTE — Assessment & Plan Note (Signed)
reported fatigue, denied congestion, headache, sore throat, cough, chest pain, SOB, or fever. No O2 desaturation. Tested positive for COVID, desires Paxlovid 150/100mg  bid po x 5 days.

## 2022-11-18 NOTE — Progress Notes (Unsigned)
Location:   [facility] Room Number: 31-A Place of Service:  SNF (31) Provider: [facility] Bader Stubblefield NP  Mahlon Gammon, MD  Patient Care Team: Mahlon Gammon, MD as PCP - General (Internal Medicine) Antavious Spanos X, NP as Nurse Practitioner (Internal Medicine) Mahlon Gammon, MD as Consulting Physician (Internal Medicine) Mahlon Gammon, MD (Internal Medicine)  Extended Emergency Contact Information Primary Emergency Contact: Mechele Claude of [place] Home Phone: [phone] Relation: Son Secondary Emergency Contact: Gerhold,[name] Mobile Phone: [phone] Relation: Son  Code Status: DNR Goals of care: Advanced Directive information    11/18/2022    3:43 PM  Advanced Directives  Does Patient Have a Medical Advance Directive? Yes  Type of Estate agent of [place];Out of facility DNR (pink MOST or yellow form)  Does patient want to make changes to medical advance directive? No - Patient declined  Copy of Healthcare Power of Attorney in Chart? Yes - validated most recent copy scanned in chart (See row information)     Chief Complaint  Patient presents with   Acute Visit    COVID,     HPI:  Pt is a 89 y.o. female seen today for an acute visit for reported fatigue, denied congestion, headache, sore throat, cough, chest pain, SOB, or fever. No O2 desaturation. Tested positive for COVID, desires Paxlovid.  CHF, 09/30/21 CXR mild congestive heart failure, CXR 10/25/21 showed CHF. Takes Furosemide, compensated clinically. Echo 06/08/08 EF 60%, DOE/cough on and off.             Peripheral neuropathy, taking Gabapentin, Vit B12 Gait abnormality, uses walker to ambulate slowly.       Hx of TIA. Allergic to statin. On ASA.  HPOA desired no hospital evaluation. Seizure like activity, likely post fall, off Tramadol, not taking anti seizure meds.  OA: T5, T8, L3 compression fxs. Takes, Tylenol, Gabapentin for pain, off Tramadol, DDD per CT head/cervical  spine 08/02/20              Hx of CVA per CT head 08/02/20 Remote infarcts within the a occipital cortices bilaterally again noted. Interval development of remote infarcts within the left parietal lobe and periventricular white matte. On ASA            Dementia, 07/15/19 CT head Mild generalized parenchymal atrophy and chronic small vessel ischemic disease, tolerated Memantine well. 06/22/22 MMSE 17/30             HTN, blood pressure is controlled on Benazepril, Amlodipine.              CKD Bun/creat 35/1.45 06/05/22             Gout stable, on Allopurinol                   Hypothyroidism, stable, on Levothyroxine. TSH 3.21 07/03/22              Abnormal CXR, suggested CT chest, not candidate for workup         Past Medical History:  Diagnosis Date   Cognitive changes    Dementia (HCC)    Depression    Gout    Hypertension    Hypothyroidism    Peripheral neuropathy    No past surgical history on file.  Allergies  Allergen Reactions   Actonel [Risedronate Sodium]    Actonel [Risedronate] Other (See Comments)    "Allergic," per [facility]   Hct [Hydrochlorothiazide]    Hydrochlorothiazide Other (  See Comments)    "Allergic," per [facility]   Lipitor [Atorvastatin Calcium]    Lipitor [Atorvastatin] Other (See Comments)    "Allergic," per [facility]   Neomycin Other (See Comments)    Unknown "been so long ago, a Dermatologist told me"   Neomycin Other (See Comments)    "Allergic," per [facility]   Polysporin [Bacitracin-Polymyxin B]    Polysporin [Bacitracin-Polymyxin B] Other (See Comments)    "Allergic," per [facility]   Prolia [Denosumab]    Prolia [Denosumab] Other (See Comments)   Sulfa Antibiotics    Sulfa Antibiotics Other (See Comments)    "Allergic," per [facility]   Zocor [Simvastatin]    Zocor [Simvastatin] Other (See Comments)    "Allergic," per [facility]    Allergies as of 11/18/2022       Reactions   Actonel [risedronate Sodium]    Actonel [risedronate] Other (See Comments)   "Allergic," per [facility]   Hct  [hydrochlorothiazide]    Hydrochlorothiazide Other (See Comments)   "Allergic," per [facility]   Lipitor [atorvastatin Calcium]    Lipitor [atorvastatin] Other (See Comments)   "Allergic," per [facility]   Neomycin Other (See Comments)   Unknown "been so long ago, a Dermatologist told me"   Neomycin Other (See Comments)   "Allergic," per [facility]   Polysporin [bacitracin-polymyxin B]    Polysporin [bacitracin-polymyxin B] Other (See Comments)   "Allergic," per [facility]   Prolia [denosumab]    Prolia [denosumab] Other (See Comments)   Sulfa Antibiotics    Sulfa Antibiotics Other (See Comments)   "Allergic," per [facility]   Zocor [simvastatin]    Zocor [simvastatin] Other (See Comments)   "Allergic," per [facility]        Medication List        Accurate as of November 18, 2022 11:59 PM. If you have any questions, ask your nurse or doctor.          acetaminophen 325 MG tablet Commonly known as: TYLENOL Take 650 mg by mouth in the morning, at noon, and at bedtime.   allopurinol 100 MG tablet Commonly known as: ZYLOPRIM Take 100 mg by mouth daily.   amLODipine 2.5 MG tablet Commonly known as: NORVASC Take 2.5 mg by mouth daily.   aspirin 81 MG chewable tablet Chew 81 mg by mouth daily.   benazepril 5 MG tablet Commonly known as: LOTENSIN Take 5 mg by mouth daily.   Calcium Carb-Cholecalciferol 500-400 MG-UNIT Tabs Take 2 tablets by mouth daily.   furosemide 40 MG tablet Commonly known as: LASIX Take 40 mg by mouth daily.   gabapentin 100 MG capsule Commonly known as: NEURONTIN Take 200 mg by mouth at bedtime.   levothyroxine 100 MCG tablet Commonly known as: SYNTHROID Take 100 mcg by mouth once a week. On Wednesday   levothyroxine 50 MCG tablet Commonly known as: SYNTHROID Take 50 mcg by mouth daily before breakfast. Sun,Mon,Tue, Thur,Fri,Sat   memantine 10 MG tablet Commonly known as: NAMENDA Take 10 mg by mouth 2 (two) times daily.   potassium chloride 10 MEQ tablet Commonly  known as: KLOR-CON M Take 1 tablet (10 mEq total) by mouth 2 (two) times daily.   Vitamin D 50 MCG (2000 UT) Caps Take 2,000 Units by mouth daily.        Review of Systems  Constitutional:  Positive for fatigue. Negative for activity change and fever.  HENT:  Positive for hearing loss. Negative for congestion and voice change.   Eyes:  Negative for visual disturbance.  Respiratory:  Positive for shortness of breath. Negative for cough and wheezing.        DOE  Cardiovascular:  Positive for leg swelling.  Gastrointestinal:  Negative for abdominal pain and constipation.  Genitourinary:  Negative for dysuria and urgency.  Musculoskeletal:  Positive for arthralgias, back pain and gait problem.       Chronic, aches.   Skin:  Negative for color change.  Neurological:  Negative for speech difficulty, weakness and light-headedness.  Psychiatric/Behavioral:  Positive for confusion. Negative for behavioral problems and sleep disturbance. The patient is not nervous/anxious.     Immunization History  Administered Date(s) Administered   Fluad Quad(high Dose 65+) 01/01/2022   Influenza, High Dose Seasonal PF 12/11/2018, 01/01/2022   Influenza-Unspecified 03/24/2018, 12/21/2019, 12/26/2020   Moderna Covid-19 Vaccine Bivalent Booster 66yrs & up 01/09/2022   Moderna SARS-COV2 Booster Vaccination 02/20/2021, 07/26/2021   Moderna Sars-Covid-2 Vaccination 03/12/2019, 04/09/2019, 04/21/2019, 01/17/2020, 08/07/2020   Pneumococcal Conjugate-13 09/09/2012, 10/28/2013   Pneumococcal Polysaccharide-23 04/02/2009, 04/16/2009   Tdap 07/15/2019   Zoster Recombinant(Shingrix) 06/20/2022   Zoster, Live 04/16/2009   Zoster, Unspecified 04/16/2009   Pertinent  Health Maintenance Due  Topic Date Due   INFLUENZA VACCINE  10/09/2022   DEXA SCAN  07/02/2023 (Originally 01/08/1992)      01/07/2022    9:04 AM 02/06/2022   10:48 AM 04/21/2022    3:50 PM 06/12/2022   10:12 AM 06/12/2022   12:58 PM  Fall Risk   Falls in the past year? 0 0 0 0 1  Was there an injury with Fall? 0 0 0 0 0  Fall Risk Category Calculator 0 0 0 0 2  Fall Risk Category (Retired) Low Low     (RETIRED) Patient Fall Risk Level Moderate fall risk Moderate fall risk     Patient at Risk for Falls Due to History of fall(s) History of fall(s) No Fall Risks No Fall Risks History of fall(s);Impaired balance/gait;Impaired mobility  Fall risk Follow up Falls evaluation completed Falls evaluation completed Falls evaluation completed Falls evaluation completed Falls evaluation completed;Education provided;Falls prevention discussed;Follow up appointment   Functional Status Survey:    Vitals:   11/18/22 1540  BP: 128/74  Pulse: 72  Resp: 18  Temp: 97.9 F (36.6 C)  SpO2: 96%  Weight: 172 lb 9.6 oz (78.3 kg)  Height: 5\' 4"  (1.626 m)   Body mass index is 29.63 kg/m. Physical Exam Vitals and nursing note reviewed.  Constitutional:      Comments: Sleepy, not usual self of ambulating with walker on the unit.   HENT:     Head: Normocephalic and atraumatic.     Nose: Nose normal.     Mouth/Throat:     Mouth: Mucous membranes are moist.     Pharynx: No oropharyngeal exudate or posterior oropharyngeal erythema.  Eyes:     Extraocular Movements: Extraocular movements intact.     Conjunctiva/sclera: Conjunctivae normal.     Pupils: Pupils are equal, round, and reactive to light.  Cardiovascular:     Rate and Rhythm: Normal rate and regular rhythm.     Heart sounds: No murmur heard.    Comments: Weak dorsalis pedis pulses R+L from previous examination.  Pulmonary:     Effort: Pulmonary effort is normal.     Breath sounds: Rales present. No wheezing or rhonchi.     Comments: Bibasilar rales.  Abdominal:     General: Bowel sounds are normal.     Palpations: Abdomen is soft.  Tenderness: There is no abdominal tenderness.  Musculoskeletal:        General: No tenderness.     Cervical back: Normal range of motion and neck  supple.     Right lower leg: Edema present.     Left lower leg: Edema present.     Comments: trace edema BLE.   Skin:    General: Skin is warm and dry.  Neurological:     General: No focal deficit present.     Mental Status: She is alert. Mental status is at baseline.     Motor: No weakness.     Coordination: Coordination normal.     Gait: Gait abnormal.  Psychiatric:        Mood and Affect: Mood normal.        Behavior: Behavior normal.     Labs reviewed: Recent Labs    12/12/21 0000 05/06/22 1200 06/05/22 0600  NA 145 138 139  139  K 4.3 4.4 4.0  4.0  CL 107 101 105  106  CO2 27* 29* 26*  26*  BUN 29* 32* 35*  35*  CREATININE 1.3* 1.8* 1.5*  1.5*  CALCIUM 9.3 9.6 9.0   Recent Labs    05/06/22 1200 06/05/22 0600  AST 13  --   ALT 10  --   ALKPHOS 89 3.9*  ALBUMIN 4.3 3.4*   Recent Labs    05/06/22 1200  WBC 9.5  NEUTROABS 7,011.00  HGB 12.8  HCT 38  PLT 227   Lab Results  Component Value Date   TSH 2.34 06/22/2020   No results found for: "HGBA1C" No results found for: "CHOL", "HDL", "LDLCALC", "LDLDIRECT", "TRIG", "CHOLHDL"  Significant Diagnostic Results in last 30 days:  No results found.  Assessment/Plan: COVID-19 virus infection reported fatigue, denied congestion, headache, sore throat, cough, chest pain, SOB, or fever. No O2 desaturation. Tested positive for COVID, desires Paxlovid 150/100mg  bid po x 5 days.   Congestive heart failure (CHF) (HCC) /24/23 CXR mild congestive heart failure, CXR 10/25/21 showed CHF. Takes Furosemide, compensated clinically. Echo 06/08/08 EF 60%, DOE/cough on and off.  Peripheral neuropathy taking Gabapentin, Vit B12, ambulates with walker normally.   TIA (transient ischemic attack)  Allergic to statin. On ASA.  HPOA desired no hospital evaluation.  Seizure-like activity (HCC)  likely post fall, off Tramadol, not taking anti seizure meds.   Generalized osteoarthritis of multiple sites  T5, T8, L3  compression fxs. Takes, Tylenol, Gabapentin for pain, off Tramadol, DDD per CT head/cervical spine 08/02/20   History of CVA (cerebrovascular accident) Hx of CVA per CT head 08/02/20 Remote infarcts within the a occipital cortices bilaterally again noted. Interval development of remote infarcts within the left parietal lobe and periventricular white matte. On ASA  Senile dementia (HCC) 07/15/19 CT head Mild generalized parenchymal atrophy and chronic small vessel ischemic disease, tolerated Memantine well. 06/22/22 MMSE 17/30  HTN (hypertension)  blood pressure is controlled on Benazepril, Amlodipine.  CKD (chronic kidney disease) stage 3, GFR 30-59 ml/min (HCC) Bun/creat 35/1.45 06/05/22  Gout  on Allopurinol      Hypothyroidism stable, on Levothyroxine. TSH 3.21 07/03/22     Family/ staff Communication: plan of care reviewed with the patient and charge nurse.   Labs/tests ordered:  none  Time spend 35 minutes.

## 2022-11-18 NOTE — Assessment & Plan Note (Signed)
07/15/19 CT head Mild generalized parenchymal atrophy and chronic small vessel ischemic disease, tolerated Memantine well. 06/22/22 MMSE 17/30

## 2022-11-18 NOTE — Assessment & Plan Note (Signed)
stable, on Levothyroxine. TSH 3.21 07/03/22  

## 2022-11-18 NOTE — Assessment & Plan Note (Signed)
taking Gabapentin, Vit B12, ambulates with walker normally.

## 2022-11-18 NOTE — Assessment & Plan Note (Signed)
Allergic to statin. On ASA.  HPOA desired no hospital evaluation.

## 2022-11-20 ENCOUNTER — Non-Acute Institutional Stay (SKILLED_NURSING_FACILITY): Payer: Medicare Other | Admitting: Sports Medicine

## 2022-11-20 ENCOUNTER — Encounter: Payer: Self-pay | Admitting: Nurse Practitioner

## 2022-11-20 ENCOUNTER — Encounter: Payer: Self-pay | Admitting: Sports Medicine

## 2022-11-20 DIAGNOSIS — G609 Hereditary and idiopathic neuropathy, unspecified: Secondary | ICD-10-CM

## 2022-11-20 DIAGNOSIS — I509 Heart failure, unspecified: Secondary | ICD-10-CM

## 2022-11-20 DIAGNOSIS — N1831 Chronic kidney disease, stage 3a: Secondary | ICD-10-CM

## 2022-11-20 DIAGNOSIS — F0390 Unspecified dementia without behavioral disturbance: Secondary | ICD-10-CM | POA: Diagnosis not present

## 2022-11-20 DIAGNOSIS — U071 COVID-19: Secondary | ICD-10-CM

## 2022-11-20 DIAGNOSIS — I10 Essential (primary) hypertension: Secondary | ICD-10-CM | POA: Diagnosis not present

## 2022-11-20 NOTE — Progress Notes (Signed)
Location:  [facility] Room Number: 31-A Place of Service:  SNF (31) Provider:  Venita Sheffield, MD    Patient Care Team: Mahlon Gammon, MD as PCP - General (Internal Medicine) Mast, Man X, NP as Nurse Practitioner (Internal Medicine) Mahlon Gammon, MD as Consulting Physician (Internal Medicine) Mahlon Gammon, MD (Internal Medicine)  Extended Emergency Contact Information Primary Emergency Contact: Mechele Claude of [place] Home Phone: [phone] Relation: Son Secondary Emergency Contact: Stumpe,[name] Mobile Phone: [phone] Relation: Son  Code Status:  DNR Goals of care: Advanced Directive information    11/20/2022   10:32 AM  Advanced Directives  Does Patient Have a Medical Advance Directive? Yes  Type of Estate agent of [place];Out of facility DNR (pink MOST or yellow form)  Copy of Healthcare Power of Attorney in Chart? Yes - validated most recent copy scanned in chart (See row information)     Chief Complaint  Patient presents with   Medical Management of Chronic Issues    Routine Visit   Immunizations    Shingrix, Influenza, and Covid    HPI:  Pt is a 89 y.o. female seen today for medical management of chronic diseases.    Pt was recently diagnosed with COVID on 9/10 and started on Paxlovid. Pt was seen and examined in her room. She is able to speak in full sentences, does not appear to be in distress. Pt just had her breakfast , ate 75% of her meals Reports some cough but states its improving  Denies chest pain, palpitations, SOB, abdominal pain, nausea, vomiting, dysuria, hematuria, bloody or dark stools.  H/o CHF - denies sob  EHO 2010- EF 60% On lasix     Latest Ref Rng & Units 06/05/2022    6:00 AM 05/06/2022   12:00 PM 12/12/2021   12:00 AM  BMP  BUN 4 - 21 4 - 21 35       35     32     29      Creatinine 0.5 - 1.1 0.5 - 1.1 1.5       1.5     1.8     1.3      Sodium 137 -  147 137 - 147 139       139     138     145      Potassium 3.5 - 5.1 mEq/L 3.5 - 5.1 mEq/L 4.0       4.0     4.4     4.3      Chloride 99 - 108 99 - 108 106       105     101     107      CO2 13 - 22 13 - 22 26       26     29     27       Calcium 8.7 - 10.7 9.0     9.6     9.3         This result is from an external source.   Multiple values from one day are sorted in reverse-chronological order        Past Medical History:  Diagnosis Date   Cognitive changes    Dementia (HCC)    Depression    Gout    Hypertension    Hypothyroidism    Peripheral neuropathy    History reviewed. No pertinent surgical history.  Allergies  Allergen Reactions   Actonel [Risedronate Sodium]    Actonel [Risedronate] Other (See Comments)    "Allergic," per [facility]   Hct [Hydrochlorothiazide]    Hydrochlorothiazide Other (See Comments)    "Allergic," per [facility]   Lipitor [Atorvastatin Calcium]    Lipitor [Atorvastatin] Other (See Comments)    "Allergic," per [facility]   Neomycin Other (See Comments)    Unknown "been so long ago, a Dermatologist told me"   Neomycin Other (See Comments)    "Allergic," per [facility]   Polysporin [Bacitracin-Polymyxin B]    Polysporin [Bacitracin-Polymyxin B] Other (See Comments)    "Allergic," per [facility]   Prolia [Denosumab]    Prolia [Denosumab] Other (See Comments)   Sulfa Antibiotics    Sulfa Antibiotics Other (See Comments)    "Allergic," per [facility]   Zocor [Simvastatin]    Zocor [Simvastatin] Other (See Comments)    "Allergic," per [facility]    Outpatient Encounter Medications as of 11/20/2022  Medication Sig   acetaminophen (TYLENOL) 325 MG tablet Take 650 mg by mouth in the morning, at noon, and at bedtime.   allopurinol (ZYLOPRIM) 100 MG tablet Take 100 mg by mouth daily.   amLODipine (NORVASC) 2.5 MG tablet Take 2.5 mg by mouth daily.   aspirin 81 MG chewable tablet Chew 81 mg by mouth daily.   benazepril (LOTENSIN) 5 MG tablet Take 5 mg by mouth daily.   Calcium  Carb-Cholecalciferol 500-400 MG-UNIT TABS Take 2 tablets by mouth daily.    Cholecalciferol (VITAMIN D) 50 MCG (2000 UT) CAPS Take 2,000 Units by mouth daily.   furosemide (LASIX) 40 MG tablet Take 40 mg by mouth daily.   gabapentin (NEURONTIN) 100 MG capsule Take 200 mg by mouth at bedtime.   levothyroxine (SYNTHROID) 100 MCG tablet Take 100 mcg by mouth once a week. On Wednesday   levothyroxine (SYNTHROID) 50 MCG tablet Take 50 mcg by mouth daily before breakfast. Sun,Mon,Tue, Thur,Fri,Sat   memantine (NAMENDA) 10 MG tablet Take 10 mg by mouth 2 (two) times daily.   potassium chloride (KLOR-CON M) 10 MEQ tablet Take 1 tablet (10 mEq total) by mouth 2 (two) times daily.   No facility-administered encounter medications on file as of 11/20/2022.    Review of Systems  Unable to perform ROS: Dementia  Constitutional:  Positive for appetite change. Negative for chills and fever.  HENT:  Positive for congestion. Negative for rhinorrhea and sore throat.   Respiratory:  Positive for cough. Negative for shortness of breath.   Cardiovascular:  Negative for chest pain and leg swelling.  Gastrointestinal:  Negative for abdominal pain.  Genitourinary:  Negative for dysuria and hematuria.  Neurological:  Negative for dizziness.  Psychiatric/Behavioral:  Negative for confusion.     Immunization History  Administered Date(s) Administered   Fluad Quad(high Dose 65+) 01/01/2022   Influenza, High Dose Seasonal PF 12/11/2018, 01/01/2022   Influenza-Unspecified 03/24/2018, 12/21/2019, 12/26/2020   Moderna Covid-19 Vaccine Bivalent Booster 4yrs & up 01/09/2022   Moderna SARS-COV2 Booster Vaccination 02/20/2021, 07/26/2021   Moderna Sars-Covid-2 Vaccination 03/12/2019, 04/09/2019, 04/21/2019, 01/17/2020, 08/07/2020   Pneumococcal Conjugate-13 09/09/2012, 10/28/2013   Pneumococcal Polysaccharide-23 04/02/2009, 04/16/2009   Tdap 07/15/2019   Zoster Recombinant(Shingrix) 06/20/2022   Zoster, Live  04/16/2009   Zoster, Unspecified 04/16/2009   Pertinent  Health Maintenance Due  Topic Date Due   INFLUENZA VACCINE  10/09/2022   DEXA SCAN  07/02/2023 (Originally 01/[birth year]/1992)      01/07/2022    9:04 AM 02/06/2022   10:48  AM 04/21/2022    3:50 PM 06/12/2022   10:12 AM 06/12/2022   12:58 PM  Fall Risk  Falls in the past year? 0 0 0 0 1  Was there an injury with Fall? 0 0 0 0 0  Fall Risk Category Calculator 0 0 0 0 2  Fall Risk Category (Retired) Low Low     (RETIRED) Patient Fall Risk Level Moderate fall risk Moderate fall risk     Patient at Risk for Falls Due to History of fall(s) History of fall(s) No Fall Risks No Fall Risks History of fall(s);Impaired balance/gait;Impaired mobility  Fall risk Follow up Falls evaluation completed Falls evaluation completed Falls evaluation completed Falls evaluation completed Falls evaluation completed;Education provided;Falls prevention discussed;Follow up appointment   Functional Status Survey:    Vitals:   11/20/22 0930  BP: 132/60  Pulse: 73  Resp: 16  Temp: (!) 97.5 F (36.4 C)  SpO2: 91%  Weight: 172 lb 9.6 oz (78.3 kg)  Height: 5\' 4"  (1.626 m)   Body mass index is 29.63 kg/m. Physical Exam Constitutional:      Appearance: Normal appearance.  Cardiovascular:     Rate and Rhythm: Normal rate and regular rhythm.     Pulses: Normal pulses.     Heart sounds: Normal heart sounds.  Pulmonary:     Effort: Pulmonary effort is normal. No respiratory distress.     Breath sounds: Normal breath sounds. No wheezing or rales.  Abdominal:     General: Bowel sounds are normal. There is no distension.     Palpations: Abdomen is soft.     Tenderness: There is no abdominal tenderness. There is no guarding.  Musculoskeletal:        General: No swelling.  Neurological:     Mental Status: She is alert.     Labs reviewed: Recent Labs    12/12/21 0000 05/06/22 1200 06/05/22 0600  NA 145 138 139  139  K 4.3 4.4 4.0  4.0  CL 107 101  105  106  CO2 27* 29* 26*  26*  BUN 29* 32* 35*  35*  CREATININE 1.3* 1.8* 1.5*  1.5*  CALCIUM 9.3 9.6 9.0   Recent Labs    05/06/22 1200 06/05/22 0600  AST 13  --   ALT 10  --   ALKPHOS 89 3.9*  ALBUMIN 4.3 3.4*   Recent Labs    05/06/22 1200  WBC 9.5  NEUTROABS 7,011.00  HGB 12.8  HCT 38  PLT 227   Lab Results  Component Value Date   TSH 2.34 06/22/2020   No results found for: "HGBA1C" No results found for: "CHOL", "HDL", "LDLCALC", "LDLDIRECT", "TRIG", "CHOLHDL"  Significant Diagnostic Results in last 30 days:  No results found.  Assessment/Plan 1. COVID-19 virus infection Doing better  O2 sat 96%  Cont with paxlovid  Will start robitussin q6 prn for cough   2. Chronic congestive heart failure, unspecified heart failure type (HCC) Euvolemic on exam  Will check cbc, bmp, bnp Cont with lasix, potassium   3. Primary hypertension Bp on the lower side Will d/c amlodipine Cont with benazepril  4. Senile dementia (HCC) Cont with supportive care  5. Idiopathic peripheral neuropathy Cont with gabapentin    Family/ staff Communication: spoke with the nurse and gave orders Labs/tests ordered:  cbc, bmp, bnp

## 2022-11-21 ENCOUNTER — Encounter: Payer: Self-pay | Admitting: Sports Medicine

## 2022-11-25 LAB — CBC: RBC: 3.67 — AB (ref 3.87–5.11)

## 2022-11-25 LAB — BASIC METABOLIC PANEL
BUN: 38 — AB (ref 4–21)
CO2: 23 — AB (ref 13–22)
Chloride: 104 (ref 99–108)
Creatinine: 1.5 — AB (ref 0.5–1.1)
Glucose: 74
Potassium: 4 meq/L (ref 3.5–5.1)
Sodium: 137 (ref 137–147)

## 2022-11-25 LAB — CBC AND DIFFERENTIAL
HCT: 36 (ref 36–46)
Hemoglobin: 12.1 (ref 12.0–16.0)
Platelets: 267 10*3/uL (ref 150–400)
WBC: 9.7

## 2022-11-25 LAB — COMPREHENSIVE METABOLIC PANEL
Calcium: 8.8 (ref 8.7–10.7)
eGFR: 32

## 2022-12-08 ENCOUNTER — Encounter: Payer: Self-pay | Admitting: Nurse Practitioner

## 2022-12-08 ENCOUNTER — Non-Acute Institutional Stay (SKILLED_NURSING_FACILITY): Payer: Self-pay | Admitting: Nurse Practitioner

## 2022-12-08 DIAGNOSIS — N1831 Chronic kidney disease, stage 3a: Secondary | ICD-10-CM | POA: Diagnosis not present

## 2022-12-08 DIAGNOSIS — M109 Gout, unspecified: Secondary | ICD-10-CM | POA: Diagnosis not present

## 2022-12-08 DIAGNOSIS — I10 Essential (primary) hypertension: Secondary | ICD-10-CM | POA: Diagnosis not present

## 2022-12-08 DIAGNOSIS — E039 Hypothyroidism, unspecified: Secondary | ICD-10-CM

## 2022-12-08 DIAGNOSIS — Z8673 Personal history of transient ischemic attack (TIA), and cerebral infarction without residual deficits: Secondary | ICD-10-CM

## 2022-12-08 DIAGNOSIS — M159 Polyosteoarthritis, unspecified: Secondary | ICD-10-CM

## 2022-12-08 DIAGNOSIS — F0390 Unspecified dementia without behavioral disturbance: Secondary | ICD-10-CM

## 2022-12-08 DIAGNOSIS — R2681 Unsteadiness on feet: Secondary | ICD-10-CM

## 2022-12-08 NOTE — Assessment & Plan Note (Signed)
stable, on Levothyroxine. TSH 3.21 07/03/22  

## 2022-12-08 NOTE — Assessment & Plan Note (Signed)
Bun/creat 38/1.49 11/25/22

## 2022-12-08 NOTE — Assessment & Plan Note (Signed)
07/15/19 CT head Mild generalized parenchymal atrophy and chronic small vessel ischemic disease, tolerated Memantine well. 06/22/22 MMSE 17/30

## 2022-12-08 NOTE — Assessment & Plan Note (Signed)
Stable, on Allopurinol.

## 2022-12-08 NOTE — Assessment & Plan Note (Signed)
09/30/21 CXR mild congestive heart failure, CXR 10/25/21 showed CHF. Takes Furosemide, compensated clinically. Echo 06/08/08 EF 60%, DOE/cough on and off. BNP 18 11/25/22

## 2022-12-08 NOTE — Assessment & Plan Note (Signed)
Hx of CVA per CT head 08/02/20 Remote infarcts within the a occipital cortices bilaterally again noted. Interval development of remote infarcts within the left parietal lobe and periventricular white matte. On ASA 

## 2022-12-08 NOTE — Assessment & Plan Note (Signed)
blood pressure is controlled on Benazepril, Amlodipine.  

## 2022-12-08 NOTE — Assessment & Plan Note (Signed)
OA: T5, T8, L3 compression fxs. Takes, Tylenol, Gabapentin for pain, off Tramadol, DDD per CT head/cervical spine 08/02/20

## 2022-12-08 NOTE — Progress Notes (Signed)
Location:   SNF [facility] Room Number: 59 Place of Service:  SNF (31) Provider: [facility] Jevaughn Degollado NP  Mahlon Gammon, MD  Patient Care Team: Mahlon Gammon, MD as PCP - General (Internal Medicine) Decarlo Rivet X, NP as Nurse Practitioner (Internal Medicine) Mahlon Gammon, MD as Consulting Physician (Internal Medicine) Mahlon Gammon, MD (Internal Medicine)  Extended Emergency Contact Information Primary Emergency Contact: Mechele Claude of [place] Home Phone: [phone] Relation: Son Secondary Emergency Contact: Marchetta,[name] Mobile Phone: [phone] Relation: Son  Code Status:  DNR Goals of care: Advanced Directive information    11/20/2022   10:32 AM  Advanced Directives  Does Patient Have a Medical Advance Directive? Yes  Type of Estate agent of [place];Out of facility DNR (pink MOST or yellow form)  Copy of Healthcare Power of Attorney in Chart? Yes - validated most recent copy scanned in chart (See row information)     Chief Complaint  Patient presents with   Medical Management of Chronic Issues    HPI:  Pt is a 89 y.o. female seen today for medical management of chronic diseases.    CHF, 09/30/21 CXR mild congestive heart failure, CXR 10/25/21 showed CHF. Takes Furosemide, compensated clinically. Echo 06/08/08 EF 60%, DOE/cough on and off. BNP 18 11/25/22             Peripheral neuropathy, taking Gabapentin, Vit B12 Gait abnormality, uses walker to ambulate slowly.       Hx of TIA. Allergic to statin. On ASA.  HPOA desired no hospital evaluation. Seizure like activity, likely post fall, off Tramadol, not taking anti seizure meds.  OA: T5, T8, L3 compression fxs. Takes, Tylenol, Gabapentin for pain, off Tramadol, DDD per CT head/cervical spine 08/02/20              Hx of CVA per CT head 08/02/20 Remote infarcts within the a occipital cortices bilaterally again noted. Interval development of remote infarcts within the left parietal  lobe and periventricular white matte. On ASA            Dementia, 07/15/19 CT head Mild generalized parenchymal atrophy and chronic small vessel ischemic disease, tolerated Memantine well. 06/22/22 MMSE 17/30             HTN, blood pressure is controlled on Benazepril, Amlodipine.              CKD Bun/creat 38/1.49 11/25/22             Gout stable, on Allopurinol                   Hypothyroidism, stable, on Levothyroxine. TSH 3.21 07/03/22              Abnormal CXR, suggested CT chest, not candidate for workup              Past Medical History:  Diagnosis Date   Cognitive changes    Dementia (HCC)    Depression    Gout    Hypertension    Hypothyroidism    Peripheral neuropathy    No past surgical history on file.  Allergies  Allergen Reactions   Actonel [Risedronate Sodium]    Actonel [Risedronate] Other (See Comments)    "Allergic," per [facility]   Hct [Hydrochlorothiazide]    Hydrochlorothiazide Other (See Comments)    "Allergic," per [facility]   Lipitor [Atorvastatin Calcium]    Lipitor [Atorvastatin] Other (See Comments)    "Allergic," per [facility]  Neomycin Other (See Comments)    Unknown "been so long ago, a Dermatologist told me"   Neomycin Other (See Comments)    "Allergic," per [facility]   Polysporin [Bacitracin-Polymyxin B]    Polysporin [Bacitracin-Polymyxin B] Other (See Comments)    "Allergic," per [facility]   Prolia [Denosumab]    Prolia [Denosumab] Other (See Comments)   Sulfa Antibiotics    Sulfa Antibiotics Other (See Comments)    "Allergic," per [facility]   Zocor [Simvastatin]    Zocor [Simvastatin] Other (See Comments)    "Allergic," per [facility]    Allergies as of 12/08/2022       Reactions   Actonel [risedronate Sodium]    Actonel [risedronate] Other (See Comments)   "Allergic," per [facility]   Hct [hydrochlorothiazide]    Hydrochlorothiazide Other (See Comments)   "Allergic," per [facility]   Lipitor [atorvastatin Calcium]    Lipitor [atorvastatin] Other (See Comments)   "Allergic," per  [facility]   Neomycin Other (See Comments)   Unknown "been so long ago, a Dermatologist told me"   Neomycin Other (See Comments)   "Allergic," per [facility]   Polysporin [bacitracin-polymyxin B]    Polysporin [bacitracin-polymyxin B] Other (See Comments)   "Allergic," per [facility]   Prolia [denosumab]    Prolia [denosumab] Other (See Comments)   Sulfa Antibiotics    Sulfa Antibiotics Other (See Comments)   "Allergic," per [facility]   Zocor [simvastatin]    Zocor [simvastatin] Other (See Comments)   "Allergic," per [facility]        Medication List        Accurate as of December 08, 2022  5:14 PM. If you have any questions, ask your nurse or doctor.          acetaminophen 325 MG tablet Commonly known as: TYLENOL Take 650 mg by mouth in the morning, at noon, and at bedtime.   allopurinol 100 MG tablet Commonly known as: ZYLOPRIM Take 100 mg by mouth daily.   amLODipine 2.5 MG tablet Commonly known as: NORVASC Take 2.5 mg by mouth daily.   aspirin 81 MG chewable tablet Chew 81 mg by mouth daily.   benazepril 5 MG tablet Commonly known as: LOTENSIN Take 5 mg by mouth daily.   Calcium Carb-Cholecalciferol 500-400 MG-UNIT Tabs Take 2 tablets by mouth daily.   furosemide 40 MG tablet Commonly known as: LASIX Take 40 mg by mouth daily.   gabapentin 100 MG capsule Commonly known as: NEURONTIN Take 200 mg by mouth at bedtime.   levothyroxine 100 MCG tablet Commonly known as: SYNTHROID Take 100 mcg by mouth once a week. On Wednesday   levothyroxine 50 MCG tablet Commonly known as: SYNTHROID Take 50 mcg by mouth daily before breakfast. Sun,Mon,Tue, Thur,Fri,Sat   memantine 10 MG tablet Commonly known as: NAMENDA Take 10 mg by mouth 2 (two) times daily.   potassium chloride 10 MEQ tablet Commonly known as: KLOR-CON M Take 1 tablet (10 mEq total) by mouth 2 (two) times daily.   Vitamin D 50 MCG (2000 UT) Caps Take 2,000 Units by mouth daily.        Review of Systems   Constitutional:  Negative for activity change, fatigue and fever.  HENT:  Positive for hearing loss. Negative for congestion and voice change.   Eyes:  Negative for visual disturbance.  Respiratory:  Positive for shortness of breath. Negative for cough and wheezing.        DOE  Cardiovascular:  Positive for leg swelling.  Gastrointestinal:  Negative  for abdominal pain and constipation.  Genitourinary:  Negative for dysuria and urgency.  Musculoskeletal:  Positive for arthralgias, back pain and gait problem.       Chronic, aches.   Skin:  Negative for color change.  Neurological:  Negative for speech difficulty, weakness and light-headedness.  Psychiatric/Behavioral:  Positive for confusion. Negative for behavioral problems and sleep disturbance. The patient is not nervous/anxious.     Immunization History  Administered Date(s) Administered   Fluad Quad(high Dose 65+) 01/01/2022   Influenza, High Dose Seasonal PF 12/11/2018, 01/01/2022   Influenza-Unspecified 03/24/2018, 12/21/2019, 12/26/2020   Moderna Covid-19 Vaccine Bivalent Booster 74yrs & up 01/09/2022   Moderna SARS-COV2 Booster Vaccination 02/20/2021, 07/26/2021   Moderna Sars-Covid-2 Vaccination 03/12/2019, 04/09/2019, 04/21/2019, 01/17/2020, 08/07/2020   Pneumococcal Conjugate-13 09/09/2012, 10/28/2013   Pneumococcal Polysaccharide-23 04/02/2009, 04/16/2009   Tdap 07/15/2019   Zoster Recombinant(Shingrix) 06/20/2022   Zoster, Live 04/16/2009   Zoster, Unspecified 04/16/2009   Pertinent  Health Maintenance Due  Topic Date Due   INFLUENZA VACCINE  10/09/2022   DEXA SCAN  07/02/2023 (Originally [birth year]/1992)      01/07/2022    9:04 AM 02/06/2022   10:48 AM 04/21/2022    3:50 PM 06/12/2022   10:12 AM 06/12/2022   12:58 PM  Fall Risk  Falls in the past year? 0 0 0 0 1  Was there an injury with Fall? 0 0 0 0 0  Fall Risk Category Calculator 0 0 0 0 2  Fall Risk Category (Retired) Low Low     (RETIRED) Patient Fall Risk  Level Moderate fall risk Moderate fall risk     Patient at Risk for Falls Due to History of fall(s) History of fall(s) No Fall Risks No Fall Risks History of fall(s);Impaired balance/gait;Impaired mobility  Fall risk Follow up Falls evaluation completed Falls evaluation completed Falls evaluation completed Falls evaluation completed Falls evaluation completed;Education provided;Falls prevention discussed;Follow up appointment   Functional Status Survey:    Vitals:   12/08/22 1534  BP: 110/68  Pulse: 78  Resp: 16  Temp: (!) 97.1 F (36.2 C)  SpO2: 94%  Weight: 172 lb 9.6 oz (78.3 kg)   Body mass index is 29.63 kg/m. Physical Exam Vitals and nursing note reviewed.  Constitutional:      Appearance: Normal appearance.  HENT:     Head: Normocephalic and atraumatic.     Nose: Nose normal.     Mouth/Throat:     Mouth: Mucous membranes are moist.     Pharynx: No oropharyngeal exudate or posterior oropharyngeal erythema.  Eyes:     Extraocular Movements: Extraocular movements intact.     Conjunctiva/sclera: Conjunctivae normal.     Pupils: Pupils are equal, round, and reactive to light.  Cardiovascular:     Rate and Rhythm: Normal rate and regular rhythm.     Heart sounds: No murmur heard.    Comments: Weak dorsalis pedis pulses R+L from previous examination.  Pulmonary:     Effort: Pulmonary effort is normal.     Breath sounds: Rales present. No wheezing or rhonchi.     Comments: Bibasilar rales.  Abdominal:     General: Bowel sounds are normal.     Palpations: Abdomen is soft.     Tenderness: There is no abdominal tenderness.  Musculoskeletal:        General: No tenderness.     Cervical back: Normal range of motion and neck supple.     Right lower leg: Edema present.  Left lower leg: Edema present.     Comments: trace edema BLE.   Skin:    General: Skin is warm and dry.  Neurological:     General: No focal deficit present.     Mental Status: She is alert. Mental  status is at baseline.     Gait: Gait abnormal.     Comments: Ambulates with walker.   Psychiatric:        Mood and Affect: Mood normal.        Behavior: Behavior normal.     Labs reviewed: Recent Labs    12/12/21 0000 05/06/22 1200 06/05/22 0600  NA 145 138 139  139  K 4.3 4.4 4.0  4.0  CL 107 101 105  106  CO2 27* 29* 26*  26*  BUN 29* 32* 35*  35*  CREATININE 1.3* 1.8* 1.5*  1.5*  CALCIUM 9.3 9.6 9.0   Recent Labs    05/06/22 1200 06/05/22 0600  AST 13  --   ALT 10  --   ALKPHOS 89 3.9*  ALBUMIN 4.3 3.4*   Recent Labs    05/06/22 1200  WBC 9.5  NEUTROABS 7,011.00  HGB 12.8  HCT 38  PLT 227   Lab Results  Component Value Date   TSH 2.34 06/22/2020   No results found for: "HGBA1C" No results found for: "CHOL", "HDL", "LDLCALC", "LDLDIRECT", "TRIG", "CHOLHDL"  Significant Diagnostic Results in last 30 days:  No results found.  Assessment/Plan  CKD (chronic kidney disease) stage 3, GFR 30-59 ml/min (HCC) Bun/creat 38/1.49 11/25/22  Gout Stable, on Allopurinol     Hypothyroidism  stable, on Levothyroxine. TSH 3.21 07/03/22   HTN (hypertension) blood pressure is controlled on Benazepril, Amlodipine.   Senile dementia (HCC) 07/15/19 CT head Mild generalized parenchymal atrophy and chronic small vessel ischemic disease, tolerated Memantine well. 06/22/22 MMSE 17/30  History of CVA (cerebrovascular accident) Hx of CVA per CT head 08/02/20 Remote infarcts within the a occipital cortices bilaterally again noted. Interval development of remote infarcts within the left parietal lobe and periventricular white matte. On ASA  Generalized osteoarthritis of multiple sites OA: T5, T8, L3 compression fxs. Takes, Tylenol, Gabapentin for pain, off Tramadol, DDD per CT head/cervical spine 08/02/20   Gait instability uses walker to ambulate slowly.    Congestive heart failure (CHF) (HCC) 09/30/21 CXR mild congestive heart failure, CXR 10/25/21 showed CHF. Takes  Furosemide, compensated clinically. Echo 06/08/08 EF 60%, DOE/cough on and off. BNP 18 11/25/22   Family/ staff Communication: plan of care reviewed with the patient and charge nurse.   Labs/tests ordered: none  Time spend 35 minutes.

## 2022-12-08 NOTE — Assessment & Plan Note (Signed)
uses walker to ambulate slowly.     

## 2023-01-16 ENCOUNTER — Encounter: Payer: Self-pay | Admitting: Nurse Practitioner

## 2023-01-16 ENCOUNTER — Non-Acute Institutional Stay (SKILLED_NURSING_FACILITY): Payer: Self-pay | Admitting: Nurse Practitioner

## 2023-01-16 DIAGNOSIS — N1831 Chronic kidney disease, stage 3a: Secondary | ICD-10-CM

## 2023-01-16 DIAGNOSIS — M159 Polyosteoarthritis, unspecified: Secondary | ICD-10-CM | POA: Diagnosis not present

## 2023-01-16 DIAGNOSIS — I509 Heart failure, unspecified: Secondary | ICD-10-CM

## 2023-01-16 DIAGNOSIS — E039 Hypothyroidism, unspecified: Secondary | ICD-10-CM

## 2023-01-16 DIAGNOSIS — I10 Essential (primary) hypertension: Secondary | ICD-10-CM

## 2023-01-16 DIAGNOSIS — F0390 Unspecified dementia without behavioral disturbance: Secondary | ICD-10-CM | POA: Diagnosis not present

## 2023-01-16 DIAGNOSIS — Z66 Do not resuscitate: Secondary | ICD-10-CM

## 2023-01-16 DIAGNOSIS — Z8673 Personal history of transient ischemic attack (TIA), and cerebral infarction without residual deficits: Secondary | ICD-10-CM

## 2023-01-16 DIAGNOSIS — M109 Gout, unspecified: Secondary | ICD-10-CM

## 2023-01-16 NOTE — Assessment & Plan Note (Signed)
07/15/19 CT head Mild generalized parenchymal atrophy and chronic small vessel ischemic disease, tolerated Memantine well. 06/22/22 MMSE 17/30

## 2023-01-16 NOTE — Assessment & Plan Note (Signed)
blood pressure is controlled on Benazepril, Amlodipine.  

## 2023-01-16 NOTE — Assessment & Plan Note (Signed)
T5, T8, L3 compression fxs. Takes, Tylenol, Gabapentin for pain, off Tramadol, DDD per CT head/cervical spine 08/02/20  

## 2023-01-16 NOTE — Assessment & Plan Note (Signed)
on Allopurinol, no recent flare ups.

## 2023-01-16 NOTE — Assessment & Plan Note (Signed)
09/30/21 CXR mild congestive heart failure, CXR 10/25/21 showed CHF. Takes Furosemide, compensated clinically. Echo 06/08/08 EF 60%, DOE/cough on and off. BNP 18 11/25/22

## 2023-01-16 NOTE — Assessment & Plan Note (Signed)
Bun/creat 38/1.49 11/25/22

## 2023-01-16 NOTE — Progress Notes (Signed)
Location:  [facility] Room Number: 031-A Place of Service:  SNF [phone]) Provider:  Shelby Peltz Melony Overly, MD  Patient Care Team: Mahlon Gammon, MD as PCP - General (Internal Medicine) Pier Laux X, NP as Nurse Practitioner (Internal Medicine) Mahlon Gammon, MD as Consulting Physician (Internal Medicine) Mahlon Gammon, MD (Internal Medicine)  Extended Emergency Contact Information Primary Emergency Contact: Mechele Claude of [place] Home Phone: [phone] Relation: Son Secondary Emergency Contact: Benito,[name] Mobile Phone: [phone] Relation: Son  Code Status:  DNR Goals of care: Advanced Directive information    01/16/2023    1:45 PM  Advanced Directives  Does Patient Have a Medical Advance Directive? Yes  Type of Estate agent of [place];Out of facility DNR (pink MOST or yellow form)  Does patient want to make changes to medical advance directive? No - Patient declined  Copy of Healthcare Power of Attorney in Chart? Yes - validated most recent copy scanned in chart (See row information)     Chief Complaint  Patient presents with   Medical Management of Chronic Issues    Routine visit and discuss shingles ,flu,and covid vaccines.    HPI:  Pt is a 89 y.o. female seen today for medical management of chronic diseases.   CHF, 09/30/21 CXR mild congestive heart failure, CXR 10/25/21 showed CHF. Takes Furosemide, compensated clinically. Echo 06/08/08 EF 60%, DOE/cough on and off. BNP 18 11/25/22             Peripheral neuropathy, taking Gabapentin, Vit B12 Gait abnormality, uses walker to ambulate slowly.       Hx of TIA. Allergic to statin. On ASA.  HPOA desired no hospital evaluation. Seizure like activity, likely post fall, off Tramadol, not taking anti seizure meds.  OA: T5, T8, L3 compression fxs. Takes, Tylenol, Gabapentin for pain, off Tramadol, DDD per CT head/cervical spine 08/02/20              Hx of  CVA per CT head 08/02/20 Remote infarcts within the a occipital cortices bilaterally again noted. Interval development of remote infarcts within the left parietal lobe and periventricular white matte. On ASA            Dementia, 07/15/19 CT head Mild generalized parenchymal atrophy and chronic small vessel ischemic disease, tolerated Memantine well. 06/22/22 MMSE 17/30             HTN, blood pressure is controlled on Benazepril, Amlodipine.              CKD Bun/creat 38/1.49 11/25/22             Gout stable, on Allopurinol                   Hypothyroidism, stable, on Levothyroxine. TSH 3.21 07/03/22              Abnormal CXR, suggested CT chest, not candidate for workup            Past Medical History:  Diagnosis Date   Cognitive changes    Dementia (HCC)    Depression    Gout    Hypertension    Hypothyroidism    Peripheral neuropathy    History reviewed. No pertinent surgical history.  Allergies  Allergen Reactions   Actonel [Risedronate Sodium]    Actonel [Risedronate] Other (See Comments)    "Allergic," per [facility]   Hct [Hydrochlorothiazide]    Hydrochlorothiazide Other (See Comments)    "  Allergic," per [facility]   Lipitor [Atorvastatin Calcium]    Lipitor [Atorvastatin] Other (See Comments)    "Allergic," per [facility]   Neomycin Other (See Comments)    Unknown "been so long ago, a Dermatologist told me"   Neomycin Other (See Comments)    "Allergic," per [facility]   Polysporin [Bacitracin-Polymyxin B]    Polysporin [Bacitracin-Polymyxin B] Other (See Comments)    "Allergic," per [facility]   Prolia [Denosumab]    Prolia [Denosumab] Other (See Comments)   Sulfa Antibiotics    Sulfa Antibiotics Other (See Comments)    "Allergic," per [facility]   Zocor [Simvastatin]    Zocor [Simvastatin] Other (See Comments)    "Allergic," per [facility]    Outpatient Encounter Medications as of 01/16/2023  Medication Sig   acetaminophen (TYLENOL) 325 MG tablet Take 650 mg by mouth in the morning, at noon, and at bedtime.    allopurinol (ZYLOPRIM) 100 MG tablet Take 100 mg by mouth daily.   aspirin 81 MG chewable tablet Chew 81 mg by mouth daily.   benazepril (LOTENSIN) 5 MG tablet Take 5 mg by mouth daily.   Calcium Carb-Cholecalciferol 500-400 MG-UNIT TABS Take 2 tablets by mouth daily.    Cholecalciferol (VITAMIN D) 50 MCG (2000 UT) CAPS Take 2,000 Units by mouth daily.   furosemide (LASIX) 40 MG tablet Take 40 mg by mouth daily.   gabapentin (NEURONTIN) 100 MG capsule Take 200 mg by mouth at bedtime.   levothyroxine (SYNTHROID) 100 MCG tablet Take 100 mcg by mouth once a week. On Wednesday   levothyroxine (SYNTHROID) 50 MCG tablet Take 50 mcg by mouth daily before breakfast. Sun,Mon,Tue, Thur,Fri,Sat   memantine (NAMENDA) 10 MG tablet Take 10 mg by mouth 2 (two) times daily.   potassium chloride (KLOR-CON M) 10 MEQ tablet Take 1 tablet (10 mEq total) by mouth 2 (two) times daily.   amLODipine (NORVASC) 2.5 MG tablet Take 2.5 mg by mouth daily. (Patient not taking: Reported on 01/16/2023)   No facility-administered encounter medications on file as of 01/16/2023.    Review of Systems  Constitutional:  Negative for activity change, fatigue and fever.  HENT:  Positive for hearing loss. Negative for congestion and voice change.   Eyes:  Negative for visual disturbance.  Respiratory:  Positive for shortness of breath. Negative for cough and wheezing.        DOE  Cardiovascular:  Positive for leg swelling.  Gastrointestinal:  Negative for abdominal pain and constipation.  Genitourinary:  Negative for dysuria and urgency.  Musculoskeletal:  Positive for arthralgias, back pain and gait problem.       Chronic, aches.   Skin:  Negative for color change.  Neurological:  Negative for speech difficulty, weakness and light-headedness.  Psychiatric/Behavioral:  Positive for confusion. Negative for behavioral problems and sleep disturbance. The patient is not nervous/anxious.     Immunization History  Administered  Date(s) Administered   Fluad Quad(high Dose 65+) 01/01/2022   Influenza, High Dose Seasonal PF 12/11/2018, 01/01/2022, 12/10/2022   Influenza-Unspecified 03/24/2018, 12/21/2019, 12/26/2020   Moderna Covid-19 Vaccine Bivalent Booster 66yrs & up 01/09/2022, 12/24/2022   Moderna SARS-COV2 Booster Vaccination 02/20/2021, 07/26/2021   Moderna Sars-Covid-2 Vaccination 03/12/2019, 04/09/2019, 04/21/2019, 01/17/2020, 08/07/2020   Pneumococcal Conjugate-13 09/09/2012, 10/28/2013   Pneumococcal Polysaccharide-23 04/02/2009, 04/16/2009   Tdap 07/15/2019   Zoster Recombinant(Shingrix) 06/20/2022   Zoster, Live 04/16/2009   Zoster, Unspecified 04/16/2009   Pertinent  Health Maintenance Due  Topic Date Due   DEXA SCAN  07/02/2023 (Originally  01[birth year]/1992)   INFLUENZA VACCINE  Completed      01/07/2022    9:04 AM 02/06/2022   10:48 AM 04/21/2022    3:50 PM 06/12/2022   10:12 AM 06/12/2022   12:58 PM  Fall Risk  Falls in the past year? 0 0 0 0 1  Was there an injury with Fall? 0 0 0 0 0  Fall Risk Category Calculator 0 0 0 0 2  Fall Risk Category (Retired) Low Low     (RETIRED) Patient Fall Risk Level Moderate fall risk Moderate fall risk     Patient at Risk for Falls Due to History of fall(s) History of fall(s) No Fall Risks No Fall Risks History of fall(s);Impaired balance/gait;Impaired mobility  Fall risk Follow up Falls evaluation completed Falls evaluation completed Falls evaluation completed Falls evaluation completed Falls evaluation completed;Education provided;Falls prevention discussed;Follow up appointment   Functional Status Survey:    Vitals:   01/16/23 1337  BP: 120/71  Pulse: 76  Resp: 18  Temp: (!) 97.2 F (36.2 C)  SpO2: 96%  Weight: 166 lb 3.2 oz (75.4 kg)  Height: 5\' 4"  (1.626 m)   Body mass index is 28.53 kg/m. Physical Exam Vitals and nursing note reviewed.  Constitutional:      Appearance: Normal appearance.     Comments: Fluctuating weight, similar weight about  over a year ago. F/u dietary recommendation, observe.   HENT:     Head: Normocephalic and atraumatic.     Nose: Nose normal.     Mouth/Throat:     Mouth: Mucous membranes are moist.     Pharynx: No oropharyngeal exudate or posterior oropharyngeal erythema.  Eyes:     Extraocular Movements: Extraocular movements intact.     Conjunctiva/sclera: Conjunctivae normal.     Pupils: Pupils are equal, round, and reactive to light.  Cardiovascular:     Rate and Rhythm: Normal rate and regular rhythm.     Heart sounds: No murmur heard.    Comments: Weak dorsalis pedis pulses R+L from previous examination.  Pulmonary:     Effort: Pulmonary effort is normal.     Breath sounds: Rales present. No wheezing or rhonchi.     Comments: Bibasilar rales.  Abdominal:     General: Bowel sounds are normal.     Palpations: Abdomen is soft.     Tenderness: There is no abdominal tenderness.  Musculoskeletal:        General: No tenderness.     Cervical back: Normal range of motion and neck supple.     Right lower leg: Edema present.     Left lower leg: Edema present.     Comments: trace edema BLE.   Skin:    General: Skin is warm and dry.  Neurological:     General: No focal deficit present.     Mental Status: She is alert. Mental status is at baseline.     Gait: Gait abnormal.     Comments: Ambulates with walker.   Psychiatric:        Mood and Affect: Mood normal.        Behavior: Behavior normal.     Labs reviewed: Recent Labs    05/06/22 1200 06/05/22 0600 11/25/22 0000  NA 138 139  139 137  K 4.4 4.0  4.0 4.0  CL 101 105  106 104  CO2 29* 26*  26* 23*  BUN 32* 35*  35* 38*  CREATININE 1.8* 1.5*  1.5* 1.5*  CALCIUM 9.6 9.0  8.8   Recent Labs    05/06/22 1200 06/05/22 0600  AST 13  --   ALT 10  --   ALKPHOS 89 3.9*  ALBUMIN 4.3 3.4*   Recent Labs    05/06/22 1200 11/25/22 0000  WBC 9.5 9.7  NEUTROABS 7,011.00  --   HGB 12.8 12.1  HCT 38 36  PLT 227 267   Lab  Results  Component Value Date   TSH 2.34 06/22/2020   No results found for: "HGBA1C" No results found for: "CHOL", "HDL", "LDLCALC", "LDLDIRECT", "TRIG", "CHOLHDL"  Significant Diagnostic Results in last 30 days:  No results found.  Assessment/Plan Generalized osteoarthritis of multiple sites T5, T8, L3 compression fxs. Takes, Tylenol, Gabapentin for pain, off Tramadol, DDD per CT head/cervical spine 08/02/20   History of CVA (cerebrovascular accident) Hx of CVA per CT head 08/02/20 Remote infarcts within the a occipital cortices bilaterally again noted. Interval development of remote infarcts within the left parietal lobe and periventricular white matte. On ASA  Senile dementia (HCC) 07/15/19 CT head Mild generalized parenchymal atrophy and chronic small vessel ischemic disease, tolerated Memantine well. 06/22/22 MMSE 17/30  HTN (hypertension) blood pressure is controlled on Benazepril, Amlodipine.   CKD (chronic kidney disease) stage 3, GFR 30-59 ml/min (HCC)  Bun/creat 38/1.49 11/25/22  Gout on Allopurinol, no recent flare ups.   Hypothyroidism  stable, on Levothyroxine. TSH 3.21 07/03/22   Congestive heart failure (CHF) (HCC) 09/30/21 CXR mild congestive heart failure, CXR 10/25/21 showed CHF. Takes Furosemide, compensated clinically. Echo 06/08/08 EF 60%, DOE/cough on and off. BNP 18 11/25/22      Family/ staff Communication: plan of care reviewed with the patient and charge nurse.   Labs/tests ordered:  none  Time spend 30 minutes.

## 2023-01-16 NOTE — Assessment & Plan Note (Signed)
stable, on Levothyroxine. TSH 3.21 07/03/22  

## 2023-01-16 NOTE — Assessment & Plan Note (Signed)
Hx of CVA per CT head 08/02/20 Remote infarcts within the a occipital cortices bilaterally again noted. Interval development of remote infarcts within the left parietal lobe and periventricular white matte. On ASA 

## 2023-02-10 ENCOUNTER — Encounter: Payer: Self-pay | Admitting: Nurse Practitioner

## 2023-02-10 ENCOUNTER — Non-Acute Institutional Stay (SKILLED_NURSING_FACILITY): Payer: Self-pay | Admitting: Nurse Practitioner

## 2023-02-10 DIAGNOSIS — R531 Weakness: Secondary | ICD-10-CM | POA: Insufficient documentation

## 2023-02-10 DIAGNOSIS — R2681 Unsteadiness on feet: Secondary | ICD-10-CM

## 2023-02-10 DIAGNOSIS — N1831 Chronic kidney disease, stage 3a: Secondary | ICD-10-CM

## 2023-02-10 DIAGNOSIS — M159 Polyosteoarthritis, unspecified: Secondary | ICD-10-CM

## 2023-02-10 DIAGNOSIS — G609 Hereditary and idiopathic neuropathy, unspecified: Secondary | ICD-10-CM | POA: Diagnosis not present

## 2023-02-10 DIAGNOSIS — Z8673 Personal history of transient ischemic attack (TIA), and cerebral infarction without residual deficits: Secondary | ICD-10-CM

## 2023-02-10 DIAGNOSIS — I10 Essential (primary) hypertension: Secondary | ICD-10-CM

## 2023-02-10 DIAGNOSIS — G459 Transient cerebral ischemic attack, unspecified: Secondary | ICD-10-CM

## 2023-02-10 DIAGNOSIS — F0390 Unspecified dementia without behavioral disturbance: Secondary | ICD-10-CM

## 2023-02-10 DIAGNOSIS — I509 Heart failure, unspecified: Secondary | ICD-10-CM | POA: Diagnosis not present

## 2023-02-10 LAB — CBC AND DIFFERENTIAL
HCT: 39 (ref 36–46)
Hemoglobin: 13 (ref 12.0–16.0)
Neutrophils Absolute: 6961
Platelets: 205 10*3/uL (ref 150–400)
WBC: 8.8

## 2023-02-10 LAB — CBC: RBC: 3.92 (ref 3.87–5.11)

## 2023-02-10 NOTE — Assessment & Plan Note (Signed)
09/30/21 CXR mild congestive heart failure, CXR 10/25/21 showed CHF. Takes Furosemide, compensated clinically. Echo 06/08/08 EF 60%, DOE/cough on and off. BNP 18 11/25/22

## 2023-02-10 NOTE — Assessment & Plan Note (Signed)
Allergic to statin. On ASA.  HPOA desired no hospital evaluation. Seizure like activity, likely post fall, off Tramadol, not taking anti seizure meds.

## 2023-02-10 NOTE — Assessment & Plan Note (Signed)
generalized weakness, malaise, didn't want to eat breakfast and lunch, able to be aroused when spoke to, no focal weakness, denied pain, no noted cough or SOB, no abd discomfort when palpated. Afebrile, no O2 desaturation.  Stat CBC/diff, CMP/eGFR VS and neuro check q4hr x 72 hours Hold Gabapentin for lethargy/sleepiness, Hold Furosemide if Bp<100/60

## 2023-02-10 NOTE — Assessment & Plan Note (Signed)
07/15/19 CT head Mild generalized parenchymal atrophy and chronic small vessel ischemic disease, tolerated Memantine well. 06/22/22 MMSE 17/30

## 2023-02-10 NOTE — Progress Notes (Signed)
Location:   SNF [facility] Room Number: 36 Place of Service:  SNF (31) Provider: [facility] Embry Manrique NP  Mahlon Gammon, MD  Patient Care Team: Mahlon Gammon, MD as PCP - General (Internal Medicine) Cathy Ropp X, NP as Nurse Practitioner (Internal Medicine) Mahlon Gammon, MD as Consulting Physician (Internal Medicine) Mahlon Gammon, MD (Internal Medicine)  Extended Emergency Contact Information Primary Emergency Contact: Mechele Claude of [place] Home Phone: [phone] Relation: Son Secondary Emergency Contact: Soo,[name] Mobile Phone: [phone] Relation: Son  Code Status: DNR Goals of care: Advanced Directive information    01/16/2023    1:45 PM  Advanced Directives  Does Patient Have a Medical Advance Directive? Yes  Type of Estate agent of [place];Out of facility DNR (pink MOST or yellow form)  Does patient want to make changes to medical advance directive? No - Patient declined  Copy of Healthcare Power of Attorney in Chart? Yes - validated most recent copy scanned in chart (See row information)     Chief Complaint  Patient presents with   Acute Visit    Generalized weakness, malaise.     HPI:  Pt is a 89 y.o. female seen today for an acute visit for generalized weakness, malaise, didn't want to eat breakfast and lunch, able to be aroused when spoke to, no focal weakness, denied pain, no noted cough or SOB, no abd discomfort when palpated. Afebrile, no O2 desaturation.    CHF, 09/30/21 CXR mild congestive heart failure, CXR 10/25/21 showed CHF. Takes Furosemide, compensated clinically. Echo 06/08/08 EF 60%, DOE/cough on and off. BNP 18 11/25/22             Peripheral neuropathy, taking Gabapentin, Vit B12 Gait abnormality, uses walker to ambulate slowly.       Hx of TIA. Allergic to statin. On ASA.  HPOA desired no hospital evaluation. Seizure like activity, likely post fall, off Tramadol, not taking anti seizure meds.  OA: T5,  T8, L3 compression fxs. Takes, Tylenol, Gabapentin for pain, off Tramadol, DDD per CT head/cervical spine 08/02/20              Hx of CVA per CT head 08/02/20 Remote infarcts within the a occipital cortices bilaterally again noted. Interval development of remote infarcts within the left parietal lobe and periventricular white matte. On ASA            Dementia, 07/15/19 CT head Mild generalized parenchymal atrophy and chronic small vessel ischemic disease, tolerated Memantine well. 06/22/22 MMSE 17/30             HTN, blood pressure is controlled on Benazepril, Amlodipine.              CKD Bun/creat 38/1.49 11/25/22             Gout stable, on Allopurinol                   Hypothyroidism, stable, on Levothyroxine. TSH 3.21 07/03/22              Abnormal CXR, suggested CT chest, not candidate for workup   Past Medical History:  Diagnosis Date   Cognitive changes    Dementia (HCC)    Depression    Gout    Hypertension    Hypothyroidism    Peripheral neuropathy    History reviewed. No pertinent surgical history.  Allergies  Allergen Reactions   Actonel [Risedronate Sodium]    Actonel [Risedronate] Other (See Comments)    "  Allergic," per [facility]   Hct [Hydrochlorothiazide]    Hydrochlorothiazide Other (See Comments)    "Allergic," per [facility]   Lipitor [Atorvastatin Calcium]    Lipitor [Atorvastatin] Other (See Comments)    "Allergic," per [facility]   Neomycin Other (See Comments)    Unknown "been so long ago, a Dermatologist told me"   Neomycin Other (See Comments)    "Allergic," per [facility]   Polysporin [Bacitracin-Polymyxin B]    Polysporin [Bacitracin-Polymyxin B] Other (See Comments)    "Allergic," per MAR   Prolia [Denosumab]    Prolia [Denosumab] Other (See Comments)   Sulfa Antibiotics    Sulfa Antibiotics Other (See Comments)    "Allergic," per [facility]   Zocor [Simvastatin]    Zocor [Simvastatin] Other (See Comments)    "Allergic," per [facility]    Allergies as of 02/10/2023       Reactions    Actonel [risedronate Sodium]    Actonel [risedronate] Other (See Comments)   "Allergic," per [facility]   Hct [hydrochlorothiazide]    Hydrochlorothiazide Other (See Comments)   "Allergic," per [facility]   Lipitor [atorvastatin Calcium]    Lipitor [atorvastatin] Other (See Comments)   "Allergic," per [facility]   Neomycin Other (See Comments)   Unknown "been so long ago, a Dermatologist told me"   Neomycin Other (See Comments)   "Allergic," per [facility]   Polysporin [bacitracin-polymyxin B]    Polysporin [bacitracin-polymyxin B] Other (See Comments)   "Allergic," per [facility]   Prolia [denosumab]    Prolia [denosumab] Other (See Comments)   Sulfa Antibiotics    Sulfa Antibiotics Other (See Comments)   "Allergic," per [facility]   Zocor [simvastatin]    Zocor [simvastatin] Other (See Comments)   "Allergic," per [facility]        Medication List        Accurate as of February 10, 2023  1:49 PM. If you have any questions, ask your nurse or doctor.          acetaminophen 325 MG tablet Commonly known as: TYLENOL Take 650 mg by mouth in the morning, at noon, and at bedtime.   allopurinol 100 MG tablet Commonly known as: ZYLOPRIM Take 100 mg by mouth daily.   amLODipine 2.5 MG tablet Commonly known as: NORVASC Take 2.5 mg by mouth daily.   aspirin 81 MG chewable tablet Chew 81 mg by mouth daily.   benazepril 5 MG tablet Commonly known as: LOTENSIN Take 5 mg by mouth daily.   Calcium Carb-Cholecalciferol 500-400 MG-UNIT Tabs Take 2 tablets by mouth daily.   furosemide 40 MG tablet Commonly known as: LASIX Take 40 mg by mouth daily.   gabapentin 100 MG capsule Commonly known as: NEURONTIN Take 200 mg by mouth at bedtime.   levothyroxine 100 MCG tablet Commonly known as: SYNTHROID Take 100 mcg by mouth once a week. On Wednesday   levothyroxine 50 MCG tablet Commonly known as: SYNTHROID Take 50 mcg by mouth daily before breakfast. Sun,Mon,Tue, Thur,Fri,Sat   memantine 10 MG tablet Commonly known  as: NAMENDA Take 10 mg by mouth 2 (two) times daily.   potassium chloride 10 MEQ tablet Commonly known as: KLOR-CON M Take 1 tablet (10 mEq total) by mouth 2 (two) times daily.   Vitamin D 50 MCG (2000 UT) Caps Take 2,000 Units by mouth daily.        Review of Systems  Constitutional:  Positive for activity change, appetite change and fatigue. Negative for fever.  HENT:  Positive for hearing loss. Negative  for congestion and voice change.   Eyes:  Negative for visual disturbance.  Respiratory:  Positive for shortness of breath. Negative for cough and wheezing.        DOE  Cardiovascular:  Positive for leg swelling.  Gastrointestinal:  Negative for abdominal pain and constipation.  Genitourinary:  Negative for dysuria and urgency.  Musculoskeletal:  Positive for arthralgias, back pain and gait problem.       Chronic, aches.   Skin:  Negative for color change.  Neurological:  Negative for speech difficulty, weakness and light-headedness.  Psychiatric/Behavioral:  Positive for confusion. Negative for behavioral problems and sleep disturbance. The patient is not nervous/anxious.     Immunization History  Administered Date(s) Administered   Fluad Quad(high Dose 65+) 01/01/2022   Influenza, High Dose Seasonal PF 12/11/2018, 01/01/2022, 12/10/2022   Influenza-Unspecified 03/24/2018, 12/21/2019, 12/26/2020   Moderna Covid-19 Vaccine Bivalent Booster 6yrs & up 01/09/2022, 12/24/2022   Moderna SARS-COV2 Booster Vaccination 02/20/2021, 07/26/2021   Moderna Sars-Covid-2 Vaccination 03/12/2019, 04/09/2019, 04/21/2019, 01/17/2020, 08/07/2020   Pneumococcal Conjugate-13 09/09/2012, 10/28/2013   Pneumococcal Polysaccharide-23 04/02/2009, 04/16/2009   Tdap 07/15/2019   Zoster Recombinant(Shingrix) 06/20/2022   Zoster, Live 04/16/2009   Zoster, Unspecified 04/16/2009   Pertinent  Health Maintenance Due  Topic Date Due   DEXA SCAN  07/02/2023 (Originally 01/08/1990)   INFLUENZA VACCINE   Completed      01/07/2022    9:04 AM 02/06/2022   10:48 AM 04/21/2022    3:50 PM 06/12/2022   10:12 AM 06/12/2022   12:58 PM  Fall Risk  Falls in the past year? 0 0 0 0 1  Was there an injury with Fall? 0 0 0 0 0  Fall Risk Category Calculator 0 0 0 0 2  Fall Risk Category (Retired) Low Low     (RETIRED) Patient Fall Risk Level Moderate fall risk Moderate fall risk     Patient at Risk for Falls Due to History of fall(s) History of fall(s) No Fall Risks No Fall Risks History of fall(s);Impaired balance/gait;Impaired mobility  Fall risk Follow up Falls evaluation completed Falls evaluation completed Falls evaluation completed Falls evaluation completed Falls evaluation completed;Education provided;Falls prevention discussed;Follow up appointment   Functional Status Survey:    Vitals:   02/10/23 1331  BP: (!) 145/83  Pulse: 95  Resp: 18  Temp: (!) 96.2 F (35.7 C)  SpO2: 91%   There is no height or weight on file to calculate BMI. Physical Exam Vitals and nursing note reviewed.  Constitutional:      Comments: Sleepiness, generalized weakness, didn't eat breakfast, lunch  HENT:     Head: Normocephalic and atraumatic.     Nose: Nose normal.     Mouth/Throat:     Mouth: Mucous membranes are moist.  Eyes:     Extraocular Movements: Extraocular movements intact.     Conjunctiva/sclera: Conjunctivae normal.     Pupils: Pupils are equal, round, and reactive to light.  Cardiovascular:     Rate and Rhythm: Normal rate and regular rhythm.     Heart sounds: No murmur heard.    Comments: Weak dorsalis pedis pulses R+L from previous examination.  Pulmonary:     Effort: Pulmonary effort is normal.     Breath sounds: Rales present. No wheezing or rhonchi.     Comments: Bibasilar rales.  Abdominal:     General: Bowel sounds are normal.     Palpations: Abdomen is soft.     Tenderness: There is no abdominal tenderness.  Musculoskeletal:        General: No tenderness.     Cervical  back: Normal range of motion and neck supple.     Right lower leg: Edema present.     Left lower leg: Edema present.     Comments: trace edema BLE.   Skin:    General: Skin is warm and dry.  Neurological:     General: No focal deficit present.     Mental Status: She is alert. Mental status is at baseline.     Gait: Gait abnormal.     Comments: Ambulates with walker.   Psychiatric:        Mood and Affect: Mood normal.     Labs reviewed: Recent Labs    05/06/22 1200 06/05/22 0600 11/25/22 0000  NA 138 139  139 137  K 4.4 4.0  4.0 4.0  CL 101 105  106 104  CO2 29* 26*  26* 23*  BUN 32* 35*  35* 38*  CREATININE 1.8* 1.5*  1.5* 1.5*  CALCIUM 9.6 9.0 8.8   Recent Labs    05/06/22 1200 06/05/22 0600  AST 13  --   ALT 10  --   ALKPHOS 89 3.9*  ALBUMIN 4.3 3.4*   Recent Labs    05/06/22 1200 11/25/22 0000  WBC 9.5 9.7  NEUTROABS 7,011.00  --   HGB 12.8 12.1  HCT 38 36  PLT 227 267   Lab Results  Component Value Date   TSH 2.34 06/22/2020   No results found for: "HGBA1C" No results found for: "CHOL", "HDL", "LDLCALC", "LDLDIRECT", "TRIG", "CHOLHDL"  Significant Diagnostic Results in last 30 days:  No results found.  Assessment/Plan: Generalized weakness generalized weakness, malaise, didn't want to eat breakfast and lunch, able to be aroused when spoke to, no focal weakness, denied pain, no noted cough or SOB, no abd discomfort when palpated. Afebrile, no O2 desaturation.  Stat CBC/diff, CMP/eGFR VS and neuro check q4hr x 72 hours Hold Gabapentin for lethargy/sleepiness, Hold Furosemide if Bp<100/60  Congestive heart failure (CHF) (HCC) 09/30/21 CXR mild congestive heart failure, CXR 10/25/21 showed CHF. Takes Furosemide, compensated clinically. Echo 06/08/08 EF 60%, DOE/cough on and off. BNP 18 11/25/22  Peripheral neuropathy  taking Gabapentin, Vit B12  Gait instability Her baseline,  uses walker to ambulate slowly, but not today.   TIA (transient  ischemic attack) Allergic to statin. On ASA.  HPOA desired no hospital evaluation. Seizure like activity, likely post fall, off Tramadol, not taking anti seizure meds.   Generalized osteoarthritis of multiple sites T5, T8, L3 compression fxs. Takes, Tylenol, Gabapentin for pain, off Tramadol, DDD per CT head/cervical spine 08/02/20   History of stroke Hx of CVA per CT head 08/02/20 Remote infarcts within the a occipital cortices bilaterally again noted. Interval development of remote infarcts within the left parietal lobe and periventricular white matte. On ASA  Senile dementia (HCC)  07/15/19 CT head Mild generalized parenchymal atrophy and chronic small vessel ischemic disease, tolerated Memantine well. 06/22/22 MMSE 17/30  HTN (hypertension)  blood pressure is controlled on Benazepril, Amlodipine.   CKD (chronic kidney disease) stage 3, GFR 30-59 ml/min (HCC) Bun/creat 38/1.49 11/25/22  Hypothyroidism  stable, on Levothyroxine. TSH 3.21 07/03/22     Family/ staff Communication: plan of care reviewed with the patient and charge nurse.   Labs/tests ordered:  CBC/diff, CMP/eGFR  Time spend 30 minutes.

## 2023-02-10 NOTE — Assessment & Plan Note (Signed)
taking Gabapentin, Vit B12

## 2023-02-10 NOTE — Assessment & Plan Note (Signed)
Bun/creat 38/1.49 11/25/22

## 2023-02-10 NOTE — Assessment & Plan Note (Signed)
T5, T8, L3 compression fxs. Takes, Tylenol, Gabapentin for pain, off Tramadol, DDD per CT head/cervical spine 08/02/20  

## 2023-02-10 NOTE — Assessment & Plan Note (Signed)
Hx of CVA per CT head 08/02/20 Remote infarcts within the a occipital cortices bilaterally again noted. Interval development of remote infarcts within the left parietal lobe and periventricular white matte. On ASA 

## 2023-02-10 NOTE — Assessment & Plan Note (Signed)
Her baseline,  uses walker to ambulate slowly, but not today.

## 2023-02-10 NOTE — Assessment & Plan Note (Signed)
blood pressure is controlled on Benazepril, Amlodipine.  

## 2023-02-10 NOTE — Assessment & Plan Note (Signed)
stable, on Levothyroxine. TSH 3.21 07/03/22  

## 2023-02-12 LAB — COMPREHENSIVE METABOLIC PANEL
Albumin: 4 (ref 3.5–5.0)
Calcium: 9.2 (ref 8.7–10.7)
Globulin: 2.7
eGFR: 26

## 2023-02-12 LAB — BASIC METABOLIC PANEL
BUN: 39 — AB (ref 4–21)
CO2: 25 — AB (ref 13–22)
Chloride: 102 (ref 99–108)
Creatinine: 1.8 — AB (ref 0.5–1.1)
Glucose: 121
Potassium: 3.9 meq/L (ref 3.5–5.1)
Sodium: 141 (ref 137–147)

## 2023-02-12 LAB — HEPATIC FUNCTION PANEL
ALT: 14 U/L (ref 7–35)
AST: 17 (ref 13–35)
Alkaline Phosphatase: 98 (ref 25–125)

## 2023-02-13 ENCOUNTER — Encounter: Payer: Self-pay | Admitting: Sports Medicine

## 2023-02-13 ENCOUNTER — Non-Acute Institutional Stay (SKILLED_NURSING_FACILITY): Payer: Medicare Other | Admitting: Sports Medicine

## 2023-02-13 DIAGNOSIS — G609 Hereditary and idiopathic neuropathy, unspecified: Secondary | ICD-10-CM

## 2023-02-13 DIAGNOSIS — E039 Hypothyroidism, unspecified: Secondary | ICD-10-CM | POA: Diagnosis not present

## 2023-02-13 DIAGNOSIS — M109 Gout, unspecified: Secondary | ICD-10-CM

## 2023-02-13 DIAGNOSIS — I10 Essential (primary) hypertension: Secondary | ICD-10-CM | POA: Diagnosis not present

## 2023-02-13 DIAGNOSIS — R531 Weakness: Secondary | ICD-10-CM

## 2023-02-13 DIAGNOSIS — I509 Heart failure, unspecified: Secondary | ICD-10-CM

## 2023-02-13 DIAGNOSIS — F0390 Unspecified dementia without behavioral disturbance: Secondary | ICD-10-CM

## 2023-02-13 DIAGNOSIS — N1832 Chronic kidney disease, stage 3b: Secondary | ICD-10-CM

## 2023-02-13 DIAGNOSIS — Z8673 Personal history of transient ischemic attack (TIA), and cerebral infarction without residual deficits: Secondary | ICD-10-CM

## 2023-02-13 NOTE — Progress Notes (Unsigned)
This encounter was created in error - please disregard.

## 2023-02-13 NOTE — Progress Notes (Signed)
Provider:   Venita Sheffield  MD Location:   [facility]    Place of Service:  Skilled care    PCP: Mahlon Gammon, MD Patient Care Team: Mahlon Gammon, MD as PCP - General (Internal Medicine) Mast, Man X, NP as Nurse Practitioner (Internal Medicine) Mahlon Gammon, MD as Consulting Physician (Internal Medicine) Mahlon Gammon, MD (Internal Medicine)  Extended Emergency Contact Information Primary Emergency Contact: Mechele Claude of [place] Home Phone: [phone] Relation: Son Secondary Emergency Contact: Miera,[name] Mobile Phone: [phone] Relation: Son  Code Status:  Goals of Care: Advanced Directive information    01/16/2023    1:45 PM  Advanced Directives  Does Patient Have a Medical Advance Directive? Yes  Type of Estate agent of [place];Out of facility DNR (pink MOST or yellow form)  Does patient want to make changes to medical advance directive? No - Patient declined  Copy of Healthcare Power of Attorney in Chart? Yes - validated most recent copy scanned in chart (See row information)      No chief complaint on file.   HPI: Patient is a 89 y.o. female seen today for routine visit for chronic disease management  As per nursing staff pt was lethargic and c/o dizziness but she is back to herself today as per staff. She is ambulating with the walker, she ate her meals in the dining hall  No agitation as per nursing staff  She was seen by Np 2 days ago for generalized weakness   Pt seen and examined in the living room  She seems comfortable, watching TV  She cannot tell me her name , cannot remember what she had for lunch  Not able to comprehend  Follows commands     COMPREHENSIVE METABOLIC PANEL GLUCOSE 121 mg/dL 65-78 H Final  Fasting reference interval For someone without known diabetes, a glucose value between 100 and 125 mg/dL is consistent with prediabetes and should be confirmed with a follow-up  test. UREA NITROGEN (BUN) 39 mg/dL 4-69 H Final CREATININE 1.75 mg/dL 6.29-5.28 H Final EGFR 26 mL/min/1.73 m2 > OR = 60 L Final BUN/CREATININE RATIO 22 (calc) 6-22 Final SODIUM 141 mmol/L 135-146 Final POTASSIUM 3.9 mmol/L 3.5-5.3 Final CHLORIDE 102 mmol/L 98-110 Final CARBON DIOXIDE 25 mmol/L 20-32 Final CALCIUM 9.2 mg/dL 4.1-32.4 Final PROTEIN, TOTAL 6.7 g/dL 4.0-1.0 Final ALBUMIN 4.0 g/dL 2.7-2.5 Final GLOBULIN 2.7 g/dL (calc) 3.6-6.4 Final ALBUMIN/GLOBULIN RATIO 1.5 (calc) 1.0-2.5 Final BILIRUBIN, TOTAL 0.3 mg/dL 4.0-3.4 Final ALKALINE PHOSPHATASE 98 U/L 37-153 Final AST 17 U/L 10-35 Final ALT 14 U/L 6-29 Final  CBC (INCLUDES DIFF/PLT) WHITE BLOOD CELL COUNT 8.8 Thousand/u L 3.8-10.8 Final RED BLOOD CELL COUNT 3.92 Million/uL 3.80-5.10 Final HEMOGLOBIN 13.0 g/dL 74.2-59.5 Final HEMATOCRIT 39.4 % 35.0-45.0 Final MCV 100.5 fL 80.0-100.0 H Final MCH 33.2 pg 27.0-33.0 H Final MCHC 33.0 g/dL 63.8-75.6 Final For adults, a slight decrease in the calculated MCHC value (in the range of 30 to 32 g/dL) is most likely not clinically significant; however, it should be interpreted with caution in correlation with other red cell parameters and the patient's clinical condition. RDW 14.4 % 11.0-15.0 Final PLATELET COUNT 205 Thousand/u L 140-400 Final    Past Medical History:  Diagnosis Date   Cognitive changes    Dementia (HCC)    Depression    Gout    Hypertension    Hypothyroidism    Peripheral neuropathy    No past surgical history on file.  reports that she has  an unknown smoking status. She has never used smokeless tobacco. She reports that she does not drink alcohol and does not use drugs. Social History   Socioeconomic History   Marital status: Widowed    Spouse name: Not on file   Number of children: Not on file   Years of education: Not on file   Highest education level: Not on file  Occupational History   Not on file  Tobacco Use   Smoking status:  Unknown   Smokeless tobacco: Never  Vaping Use   Vaping status: Never Used  Substance and Sexual Activity   Alcohol use: Never   Drug use: Never   Sexual activity: Not on file  Other Topics Concern   Not on file  Social History Narrative   ** Merged History Encounter **       Social Determinants of Health   Financial Resource Strain: Not on file  Food Insecurity: Not on file  Transportation Needs: Not on file  Physical Activity: Not on file  Stress: Not on file  Social Connections: Not on file  Intimate Partner Violence: Not on file    Functional Status Survey:    No family history on file.  Health Maintenance  Topic Date Due   Zoster Vaccines- Shingrix (2 of 2) 08/15/2022   DEXA SCAN  07/02/2023 (Originally 01/08/1990)   COVID-19 Vaccine (10 - 2023-24 season) 02/18/2023   Medicare Annual Wellness (AWV)  06/12/2023   DTaP/Tdap/Td (2 - Td or Tdap) 07/14/2029   Pneumonia Vaccine 3+ Years old  Completed   INFLUENZA VACCINE  Completed   HPV VACCINES  Aged Out    Allergies  Allergen Reactions   Actonel [Risedronate Sodium]    Actonel [Risedronate] Other (See Comments)    "Allergic," per [facility]   Hct [Hydrochlorothiazide]    Hydrochlorothiazide Other (See Comments)    "Allergic," per [facility]   Lipitor [Atorvastatin Calcium]    Lipitor [Atorvastatin] Other (See Comments)    "Allergic," per [facility]   Neomycin Other (See Comments)    Unknown "been so long ago, a Dermatologist told me"   Neomycin Other (See Comments)    "Allergic," per [facility]   Polysporin [Bacitracin-Polymyxin B]    Polysporin [Bacitracin-Polymyxin B] Other (See Comments)    "Allergic," per [facility]   Prolia [Denosumab]    Prolia [Denosumab] Other (See Comments)   Sulfa Antibiotics    Sulfa Antibiotics Other (See Comments)    "Allergic," per [facility]   Zocor [Simvastatin]    Zocor [Simvastatin] Other (See Comments)    "Allergic," per [facility]    Outpatient Encounter Medications as of 02/13/2023  Medication Sig    acetaminophen (TYLENOL) 325 MG tablet Take 650 mg by mouth in the morning, at noon, and at bedtime.   allopurinol (ZYLOPRIM) 100 MG tablet Take 100 mg by mouth daily.   amLODipine (NORVASC) 2.5 MG tablet Take 2.5 mg by mouth daily. (Patient not taking: Reported on 01/16/2023)   aspirin 81 MG chewable tablet Chew 81 mg by mouth daily.   benazepril (LOTENSIN) 5 MG tablet Take 5 mg by mouth daily.   Calcium Carb-Cholecalciferol 500-400 MG-UNIT TABS Take 2 tablets by mouth daily.    Cholecalciferol (VITAMIN D) 50 MCG (2000 UT) CAPS Take 2,000 Units by mouth daily.   furosemide (LASIX) 40 MG tablet Take 40 mg by mouth daily.   gabapentin (NEURONTIN) 100 MG capsule Take 200 mg by mouth at bedtime.   levothyroxine (SYNTHROID) 100 MCG tablet Take 100 mcg by mouth once  a week. On Wednesday   levothyroxine (SYNTHROID) 50 MCG tablet Take 50 mcg by mouth daily before breakfast. Sun,Mon,Tue, Thur,Fri,Sat   memantine (NAMENDA) 10 MG tablet Take 10 mg by mouth 2 (two) times daily.   potassium chloride (KLOR-CON M) 10 MEQ tablet Take 1 tablet (10 mEq total) by mouth 2 (two) times daily.   No facility-administered encounter medications on file as of 02/13/2023.    Review of Systems  Unable to perform ROS: Dementia  Constitutional:  Negative for fever.  Respiratory:  Negative for cough, shortness of breath and wheezing.   Cardiovascular:  Negative for chest pain and leg swelling.  Gastrointestinal:  Negative for abdominal distention, abdominal pain, blood in stool, constipation, diarrhea and vomiting.  Genitourinary:  Negative for dysuria.  Neurological:  Negative for dizziness.  Psychiatric/Behavioral:  Positive for confusion.     There were no vitals filed for this visit. There is no height or weight on file to calculate BMI. Physical Exam Constitutional:      Appearance: Normal appearance.  HENT:     Head: Normocephalic and atraumatic.  Cardiovascular:     Rate and Rhythm: Normal rate and regular  rhythm.  Pulmonary:     Effort: Pulmonary effort is normal. No respiratory distress.     Breath sounds: Normal breath sounds. No wheezing.  Abdominal:     General: Bowel sounds are normal. There is no distension.     Tenderness: There is no abdominal tenderness. There is no guarding or rebound.     Comments:    Musculoskeletal:        General: No swelling or tenderness.  Neurological:     Mental Status: She is alert. Mental status is at baseline.     Motor: No weakness.     Labs reviewed: Basic Metabolic Panel: Recent Labs    05/06/22 1200 06/05/22 0600 11/25/22 0000  NA 138 139  139 137  K 4.4 4.0  4.0 4.0  CL 101 105  106 104  CO2 29* 26*  26* 23*  BUN 32* 35*  35* 38*  CREATININE 1.8* 1.5*  1.5* 1.5*  CALCIUM 9.6 9.0 8.8   Liver Function Tests: Recent Labs    05/06/22 1200 06/05/22 0600  AST 13  --   ALT 10  --   ALKPHOS 89 3.9*  ALBUMIN 4.3 3.4*   No results for input(s): "LIPASE", "AMYLASE" in the last 8760 hours. No results for input(s): "AMMONIA" in the last 8760 hours. CBC: Recent Labs    05/06/22 1200 11/25/22 0000  WBC 9.5 9.7  NEUTROABS 7,011.00  --   HGB 12.8 12.1  HCT 38 36  PLT 227 267   Cardiac Enzymes: No results for input(s): "CKTOTAL", "CKMB", "CKMBINDEX", "TROPONINI" in the last 8760 hours. BNP: Invalid input(s): "POCBNP" No results found for: "HGBA1C" Lab Results  Component Value Date   TSH 2.34 06/22/2020   No results found for: "VITAMINB12" No results found for: "FOLATE" No results found for: "IRON", "TIBC", "FERRITIN"  Imaging and Procedures obtained prior to SNF admission: MR BRAIN WO CONTRAST  Result Date: 11/25/2020 CLINICAL DATA:  TIA EXAM: MRI HEAD WITHOUT CONTRAST MRA HEAD WITHOUT CONTRAST TECHNIQUE: Multiplanar, multi-echo pulse sequences of the brain and surrounding structures were acquired without intravenous contrast. Angiographic images of the Circle of Willis were acquired using MRA technique without  intravenous contrast. COMPARISON:  Head CT from yesterday FINDINGS: MRI HEAD FINDINGS Brain: No acute infarction, hemorrhage, hydrocephalus, extra-axial collection or mass lesion. Remote bilateral occipital  and left parietal cortically based infarcts. Chronic small vessel ischemia in the deep white matter which is mild for age. Nonspecific cerebral volume loss. Vascular: Major flow voids are preserved Skull and upper cervical spine: Posterior scalp swelling. No evidence of bone lesion. Sinuses/Orbits: Bilateral cataract resection.  No evidence of injury Other: Moderate motion artifact to the degree that findings could be obscured. MRA HEAD FINDINGS Significant motion artifact intermittently, especially at the level of the circle-of-Willis. The covered vertebral, basilar, and carotid arteries are patent. A1, M1, and P1 segments are largely obscured by motion artifact. No detected branch occlusion or aneurysm. IMPRESSION: Brain MRI: 1. No acute intracranial finding.  No acute infarct. 2. Chronic small vessel disease and small remote cortical infarcts. 3. Posterior scalp contusion. 4. Moderate motion artifact Intracranial MRA: Motion artifact with multiple nondiagnostic slices. Unremarkable vessels where visible. Electronically Signed   By: Tiburcio Pea M.D.   On: 11/25/2020 06:39   MR ANGIO HEAD WO CONTRAST  Result Date: 11/25/2020 CLINICAL DATA:  TIA EXAM: MRI HEAD WITHOUT CONTRAST MRA HEAD WITHOUT CONTRAST TECHNIQUE: Multiplanar, multi-echo pulse sequences of the brain and surrounding structures were acquired without intravenous contrast. Angiographic images of the Circle of Willis were acquired using MRA technique without intravenous contrast. COMPARISON:  Head CT from yesterday FINDINGS: MRI HEAD FINDINGS Brain: No acute infarction, hemorrhage, hydrocephalus, extra-axial collection or mass lesion. Remote bilateral occipital and left parietal cortically based infarcts. Chronic small vessel ischemia in the  deep white matter which is mild for age. Nonspecific cerebral volume loss. Vascular: Major flow voids are preserved Skull and upper cervical spine: Posterior scalp swelling. No evidence of bone lesion. Sinuses/Orbits: Bilateral cataract resection.  No evidence of injury Other: Moderate motion artifact to the degree that findings could be obscured. MRA HEAD FINDINGS Significant motion artifact intermittently, especially at the level of the circle-of-Willis. The covered vertebral, basilar, and carotid arteries are patent. A1, M1, and P1 segments are largely obscured by motion artifact. No detected branch occlusion or aneurysm. IMPRESSION: Brain MRI: 1. No acute intracranial finding.  No acute infarct. 2. Chronic small vessel disease and small remote cortical infarcts. 3. Posterior scalp contusion. 4. Moderate motion artifact Intracranial MRA: Motion artifact with multiple nondiagnostic slices. Unremarkable vessels where visible. Electronically Signed   By: Tiburcio Pea M.D.   On: 11/25/2020 06:39   DG Pelvis Portable  Result Date: 11/25/2020 CLINICAL DATA:  Fall at home. EXAM: PORTABLE PELVIS 1-2 VIEWS COMPARISON:  None. FINDINGS: There is no evidence of pelvic fracture or diastasis. No pelvic bone lesions are seen. Atherosclerosis and osteopenia. IMPRESSION: No acute finding Electronically Signed   By: Tiburcio Pea M.D.   On: 11/25/2020 04:11   DG Abd Portable 1V  Result Date: 11/25/2020 CLINICAL DATA:  Fall at home EXAM: PORTABLE ABDOMEN - 1 VIEW COMPARISON:  None. FINDINGS: Normal bowel gas pattern. No concerning mass effect or calcification. Generalized osteopenia and lumbar spine degeneration. L1 and L2 wedging, chronic at L2 when compared to a 2011 abdominal CT. Given the degree of T12-L1 spurring, L1 wedging is likely also chronic. IMPRESSION: Normal bowel gas pattern. Electronically Signed   By: Tiburcio Pea M.D.   On: 11/25/2020 04:11   EEG adult  Result Date: 11/24/2020 Jefferson Fuel,  MD     11/24/2020  8:40 PM Routine EEG Report LAENA LICK is a 89 y.o. female with a history of spell who is undergoing an EEG to evaluate for seizures. Report: This EEG was acquired with electrodes  placed according to the International 10-20 electrode system (including Fp1, Fp2, F3, F4, C3, C4, P3, P4, O1, O2, T3, T4, T5, T6, A1, A2, Fz, Cz, Pz). The following electrodes were missing or displaced: none. There was no waking occipital dominant rhythm. The best background was 6-7 Hz. This activity is reactive to stimulation. Sleep was identified by K complexes and sleep spindles. There was no focal slowing. There were no definitive interictal epileptiform discharges. There were no electrographic seizures identified. Photic stimulation and hyperventilation were not performed. Impression and clinical correlation: This EEG was obtained while asleep and is abnormal due to mild-to-moderate diffuse slowing. There were no electrographic seizures or definitive epileptiform abnormalities seen during this recording. Bing Neighbors, MD [facility] [phone] If 7pm- 7am, please page neurology on call as listed in AMION.   CT HEAD WO CONTRAST  Result Date: 11/24/2020 CLINICAL DATA:  Altered mental status, fall EXAM: CT HEAD WITHOUT CONTRAST TECHNIQUE: Contiguous axial images were obtained from the base of the skull through the vertex without intravenous contrast. COMPARISON:  CT head 08/02/2020 FINDINGS: Brain: There is no evidence of acute intracranial hemorrhage, extra-axial fluid collection, or acute infarct. A remote infarct in the left parietal lobe is unchanged. Mild parenchymal volume loss and chronic white matter microangiopathy are unchanged. The ventricles are stable in size. There is no mass lesion. There is no midline shift. Vascular: There is calcification of the bilateral cavernous ICAs. Skull: Normal. Negative for fracture or focal lesion. Sinuses/Orbits: The paranasal sinuses are clear. The globes  and orbits are unremarkable. Other: None. IMPRESSION: 1. No acute intracranial pathology. 2. Unchanged remote left parietal lobe infarct, parenchymal volume loss, and chronic white matter microangiopathy. Electronically Signed   By: Lesia Hausen M.D.   On: 11/24/2020 14:29   CT CERVICAL SPINE WO CONTRAST  Result Date: 11/24/2020 CLINICAL DATA:  Trauma, fall EXAM: CT CERVICAL SPINE WITHOUT CONTRAST TECHNIQUE: Multidetector CT imaging of the cervical spine was performed without intravenous contrast. Multiplanar CT image reconstructions were also generated. COMPARISON:  CT cervical spine 08/02/2020 FINDINGS: Alignment: There is straightened curvature of the cervical spine with slight focal kyphosis centered at C5, similar to the prior study. There is grade 1 anterolisthesis of C3 on C4 and C4 on C5, also unchanged and likely degenerative in nature. There is no evidence of traumatic malalignment. There is no jumped or perched facet. Skull base and vertebrae: Skull base alignment is maintained. Vertebral body heights are preserved. There is no evidence of acute fracture. Soft tissues and spinal canal: No prevertebral fluid or swelling. No visible canal hematoma. Disc levels: There is marked intervertebral disc space narrowing at C5-C6, similar to the prior study. Degenerative endplate changes also most advanced at this level. There is multilevel facet arthropathy, similar to the prior study. There is mild narrowing of the craniocervical junction. The osseous spinal canal is otherwise patent. Upper chest: There is mosaic attenuation in the lung apices with mild smooth interlobular septal thickening. Other: The left thyroid lobe is surgically absent. The soft tissues are otherwise unremarkable. IMPRESSION: 1. No acute fracture or traumatic malalignment of the cervical spine. 2. Multilevel degenerative changes as above, similar to the prior study. 3. Smooth interlobular septal thickening in the lung apices can be seen  with pulmonary interstitial edema. Mosaic attenuation in the lung apices suggests air trapping/small airway disease. Electronically Signed   By: Lesia Hausen M.D.   On: 11/24/2020 14:22   DG Chest Portable 1 View  Result Date: 11/24/2020 CLINICAL DATA:  Syncope. EXAM: PORTABLE CHEST 1 VIEW COMPARISON:  Jul 15, 2019 FINDINGS: Stable cardiomegaly. The hila and mediastinum are unremarkable. No pneumothorax. No overt edema or focal infiltrate. Nodular density projected over the right base is favored to represent confluence of shadows or atelectasis. No other acute abnormalities. IMPRESSION: A nodular density projected over the right base is favored to represent confluence of shadows or atelectasis. Recommend a PA and lateral chest x-Sangiovanni before discharge. No acute abnormalities are seen. Electronically Signed   By: Gerome Sam III M.D.   On: 11/24/2020 13:45    Assessment/Plan  Generalized weakness  Pt doing better as per nursing staff  Labs showed worsening kidney function  Will inform  staff to encourage to drink more water  Will check bmp in a week   Primary hypertension BP at goal  Cont with benazepril   Chronic congestive heart failure, unspecified heart failure type (HCC) Lungs clear  No lower extremity swelling Will decrease lasix to 20 mg from 40 mg due to worsening cr  Will check bmp, bnp  Limit salt intake    Hypothyroidism, unspecified type Lab Results  Component Value Date   TSH 2.34 06/22/2020   Cont with synthyroid     Idiopathic peripheral neuropathy Instructed nurse to hold off gabapentin for lethargy and sedation      Gout, unspecified cause, unspecified chronicity, unspecified site Cont with allopuirnol    Major neurocognitive disorder (HCC) Cont with supportive care  Cont with namenda  1 - No difficulty either subjectively or objectively  2 - Complains of forgetting the location of objects--subjective word-finding difficulties  3 - Decreased job  functioning evident to co-workers. Difficulty in traveling to new locations. Decreased organizational capacity*  4 - Decreased ability to perform complex tasks (e.g., planning dinner for guests, handling. personal finances, difficulty marketing, etc.)  5 - Requires assistance in choosing proper clothing to wear for the day, season, or occasion (e.g., a patient may wear the same clothing repeatedly unless supervised)*  6a - Improperly putting on clothes without assistance or prompting (e.g., may put street clothes on overnight clothes, put shoes on wrong feet, or have difficulty buttoning clothing) occasionally or more frequently over the past weeks*  6b - Unable to bathe properly (e.g., difficulty adjusting bathwater temp.) occasionally or more frequently over the past weeks*  6c - Inability to handle mechanics of toileting (e.g., forgets to flush the toilet, does not wipe properly or properly dispose of toilet tissue) occasionally or more frequently over the past weeks*  6d - Urinary incontinence occasionally or more frequently over the past weeks*  6e - Fecal incontinence occasionally or more frequently over the past weeks*  7a - Ability to speak limited to approximately a half-dozen different intelligible words or fewer in an average day or the course of an intensive interview  7b - Speech ability is limited to using a single intelligible word on an average day or in an intensive interview (the person may repeat the word over and over)  7c - Ambulatory ability is lost (cannot walk without personal assistance)  7d - Cannot sit up without assistance  7e - Loss of ability to smile  81f - Loss of ability to hold head up independently Functional Assessment Staging Scale: 7a - Ability to speak limited to approximately a half-dozen different intelligible words or fewer in an average day or the course of an intensive interview.     History of CVA (cerebrovascular accident) Cont with aspirin  Generalized weakness Will check bmp ,bnp  Will decrease lasix to 20 mg due to worsening cr Increase oral hydration    CKD stage 3b, GFR 30-44 ml/min (HCC) Will decrease lasix to 20 mg due to worsening cr Increase oral hydration  Will check bmp    Family/ staff Communication:  care plan discussed with the nursing staff   Labs/tests ordered: bmp, bnp   30 Total time spent for obtaining history,  performing a medically appropriate examination and evaluation, reviewing the tests,ordering  tests,  documenting clinical information in the electronic or other health record, independently interpreting results ,care coordination (not separately reported)

## 2023-02-24 LAB — BASIC METABOLIC PANEL
BUN: 31 — AB (ref 4–21)
CO2: 23 — AB (ref 13–22)
Chloride: 108 (ref 99–108)
Creatinine: 1.4 — AB (ref 0.5–1.1)
Glucose: 64
Potassium: 4.1 meq/L (ref 3.5–5.1)
Sodium: 140 (ref 137–147)

## 2023-02-24 LAB — COMPREHENSIVE METABOLIC PANEL: Calcium: 8.8 (ref 8.7–10.7)

## 2023-03-05 ENCOUNTER — Non-Acute Institutional Stay (SKILLED_NURSING_FACILITY): Payer: Medicare Other | Admitting: Sports Medicine

## 2023-03-05 ENCOUNTER — Encounter: Payer: Self-pay | Admitting: Sports Medicine

## 2023-03-05 DIAGNOSIS — L089 Local infection of the skin and subcutaneous tissue, unspecified: Secondary | ICD-10-CM

## 2023-03-05 DIAGNOSIS — L723 Sebaceous cyst: Secondary | ICD-10-CM | POA: Diagnosis not present

## 2023-03-05 NOTE — Progress Notes (Signed)
Provider:  Venita Sheffield MD Location:   [facility]   Place of Service:   Skilled care   PCP: Mahlon Gammon, MD Patient Care Team: Mahlon Gammon, MD as PCP - General (Internal Medicine) Mast, Man X, NP as Nurse Practitioner (Internal Medicine) Mahlon Gammon, MD as Consulting Physician (Internal Medicine) Mahlon Gammon, MD (Internal Medicine)  Extended Emergency Contact Information Primary Emergency Contact: Mechele Claude of [place] Home Phone: [phone] Relation: Son Secondary Emergency Contact: Harmon,[name] Mobile Phone: [phone] Relation: Son  Code Status: DNR Goals of Care: Advanced Directive information    02/13/2023    3:37 PM  Advanced Directives  Does Patient Have a Medical Advance Directive? Yes  Type of Estate agent of [place];Out of facility DNR (pink MOST or yellow form)  Does patient want to make changes to medical advance directive? No - Patient declined  Copy of Healthcare Power of Attorney in Chart? Yes - validated most recent copy scanned in chart (See row information)  Pre-existing out of facility DNR order (yellow form or pink MOST form) Pink MOST form placed in chart (order not valid for inpatient use);Yellow form placed in chart (order not valid for inpatient use)      No chief complaint on file.   HPI: Patient is a 89 y.o. female seen today for acute visit for a bump on her chest.  Patient seen and examined in the living room.  She knows her name, cannot remember what she had for breakfast.  Does not seem to be in pain and sitting comfortably in her recliner chair. As per staff patient noticed the bump on her mid chest yesterday not sure for how long it has been there. She has about 2.2 cm raised sebaceous cyst on her chest with slight erythema and drainage.  She expressed some tenderness on palpation  There is no surrounding erythema.  Past Medical History:  Diagnosis Date   Cognitive  changes    Dementia (HCC)    Depression    Gout    Hypertension    Hypothyroidism    Peripheral neuropathy    No past surgical history on file.  reports that she has an unknown smoking status. She has never used smokeless tobacco. She reports that she does not drink alcohol and does not use drugs. Social History   Socioeconomic History   Marital status: Widowed    Spouse name: Not on file   Number of children: Not on file   Years of education: Not on file   Highest education level: Not on file  Occupational History   Not on file  Tobacco Use   Smoking status: Unknown   Smokeless tobacco: Never  Vaping Use   Vaping status: Never Used  Substance and Sexual Activity   Alcohol use: Never   Drug use: Never   Sexual activity: Not on file  Other Topics Concern   Not on file  Social History Narrative   ** Merged History Encounter **       Social Drivers of Health   Financial Resource Strain: Not on file  Food Insecurity: Not on file  Transportation Needs: Not on file  Physical Activity: Not on file  Stress: Not on file  Social Connections: Not on file  Intimate Partner Violence: Not on file    Functional Status Survey:    No family history on file.  Health Maintenance  Topic Date Due   Zoster Vaccines- Shingrix (2 of 2)  08/15/2022   COVID-19 Vaccine (10 - 2024-25 season) 02/18/2023   DEXA SCAN  07/02/2023 (Originally 01/08/1990)   Medicare Annual Wellness (AWV)  06/12/2023   DTaP/Tdap/Td (2 - Td or Tdap) 07/14/2029   Pneumonia Vaccine 66+ Years old  Completed   INFLUENZA VACCINE  Completed   HPV VACCINES  Aged Out    Allergies  Allergen Reactions   Actonel [Risedronate Sodium]    Actonel [Risedronate] Other (See Comments)    "Allergic," per [facility]   Hct [Hydrochlorothiazide]    Hydrochlorothiazide Other (See Comments)    "Allergic," per [facility]   Lipitor [Atorvastatin Calcium]    Lipitor [Atorvastatin] Other (See Comments)    "Allergic," per [facility]   Neomycin  Other (See Comments)    Unknown "been so long ago, a Dermatologist told me"   Neomycin Other (See Comments)    "Allergic," per [facility]   Polysporin [Bacitracin-Polymyxin B]    Polysporin [Bacitracin-Polymyxin B] Other (See Comments)    "Allergic," per [facility]   Prolia [Denosumab]    Prolia [Denosumab] Other (See Comments)   Sulfa Antibiotics    Sulfa Antibiotics Other (See Comments)    "Allergic," per [facility]   Zocor [Simvastatin]    Zocor [Simvastatin] Other (See Comments)    "Allergic," per [facility]    Outpatient Encounter Medications as of 03/05/2023  Medication Sig   acetaminophen (TYLENOL) 325 MG tablet Take 650 mg by mouth in the morning, at noon, and at bedtime.   allopurinol (ZYLOPRIM) 100 MG tablet Take 100 mg by mouth daily.   amLODipine (NORVASC) 2.5 MG tablet Take 2.5 mg by mouth daily. (Patient not taking: Reported on 02/13/2023)   aspirin 81 MG chewable tablet Chew 81 mg by mouth daily.   benazepril (LOTENSIN) 5 MG tablet Take 5 mg by mouth daily.   Calcium Carb-Cholecalciferol 500-400 MG-UNIT TABS Take 2 tablets by mouth daily.    Cholecalciferol (VITAMIN D) 50 MCG (2000 UT) CAPS Take 2,000 Units by mouth daily.   furosemide (LASIX) 40 MG tablet Take 40 mg by mouth daily.   gabapentin (NEURONTIN) 100 MG capsule Take 200 mg by mouth at bedtime.   levothyroxine (SYNTHROID) 100 MCG tablet Take 100 mcg by mouth once a week. On Wednesday   levothyroxine (SYNTHROID) 50 MCG tablet Take 50 mcg by mouth daily before breakfast. Sun,Mon,Tue, Thur,Fri,Sat   memantine (NAMENDA) 10 MG tablet Take 10 mg by mouth 2 (two) times daily.   potassium chloride (KLOR-CON M) 10 MEQ tablet Take 1 tablet (10 mEq total) by mouth 2 (two) times daily.   No facility-administered encounter medications on file as of 03/05/2023.    Review of Systems  Constitutional:  Negative for chills and fever.  Respiratory:  Negative for cough, shortness of breath and wheezing.   Cardiovascular:  Negative for chest pain and  leg swelling.  Gastrointestinal:  Negative for abdominal distention, abdominal pain, blood in stool, constipation, diarrhea, nausea and vomiting.  Genitourinary:  Negative for dysuria.  Skin:        Bump on her chest  Neurological:  Negative for dizziness.    There were no vitals filed for this visit. There is no height or weight on file to calculate BMI. Physical Exam Constitutional:      Appearance: Normal appearance.  Cardiovascular:     Rate and Rhythm: Normal rate and regular rhythm.  Pulmonary:     Effort: Pulmonary effort is normal. No respiratory distress.     Breath sounds: Normal breath sounds. No wheezing.  Abdominal:  General: Bowel sounds are normal. There is no distension.     Tenderness: There is no abdominal tenderness. There is no guarding or rebound.     Comments:    Musculoskeletal:        General: No swelling.  Skin:    Comments: 2 cm raised sebaceous cyst on her lower mid chest with slight erythema, covered with scab with sebum drainage.  Neurological:     Mental Status: She is alert. Mental status is at baseline.     Motor: No weakness.     Labs reviewed: Basic Metabolic Panel: Recent Labs    06/05/22 0600 11/25/22 0000 02/12/23 0000  NA 139  139 137 141  K 4.0  4.0 4.0 3.9  CL 105  106 104 102  CO2 26*  26* 23* 25*  BUN 35*  35* 38* 39*  CREATININE 1.5*  1.5* 1.5* 1.8*  CALCIUM 9.0 8.8 9.2   Liver Function Tests: Recent Labs    05/06/22 1200 06/05/22 0600 02/12/23 0000  AST 13  --  17  ALT 10  --  14  ALKPHOS 89 3.9* 98  ALBUMIN 4.3 3.4* 4.0   No results for input(s): "LIPASE", "AMYLASE" in the last 8760 hours. No results for input(s): "AMMONIA" in the last 8760 hours. CBC: Recent Labs    05/06/22 1200 11/25/22 0000 02/10/23 0000  WBC 9.5 9.7 8.8  NEUTROABS 7,011.00  --  6,961.00  HGB 12.8 12.1 13.0  HCT 38 36 39  PLT 227 267 205   Cardiac Enzymes: No results for input(s): "CKTOTAL", "CKMB", "CKMBINDEX",  "TROPONINI" in the last 8760 hours. BNP: Invalid input(s): "POCBNP" No results found for: "HGBA1C" Lab Results  Component Value Date   TSH 2.34 06/22/2020   No results found for: "VITAMINB12" No results found for: "FOLATE" No results found for: "IRON", "TIBC", "FERRITIN"  Imaging and Procedures obtained prior to SNF admission: MR BRAIN WO CONTRAST Result Date: 11/25/2020 CLINICAL DATA:  TIA EXAM: MRI HEAD WITHOUT CONTRAST MRA HEAD WITHOUT CONTRAST TECHNIQUE: Multiplanar, multi-echo pulse sequences of the brain and surrounding structures were acquired without intravenous contrast. Angiographic images of the Circle of Willis were acquired using MRA technique without intravenous contrast. COMPARISON:  Head CT from yesterday FINDINGS: MRI HEAD FINDINGS Brain: No acute infarction, hemorrhage, hydrocephalus, extra-axial collection or mass lesion. Remote bilateral occipital and left parietal cortically based infarcts. Chronic small vessel ischemia in the deep white matter which is mild for age. Nonspecific cerebral volume loss. Vascular: Major flow voids are preserved Skull and upper cervical spine: Posterior scalp swelling. No evidence of bone lesion. Sinuses/Orbits: Bilateral cataract resection.  No evidence of injury Other: Moderate motion artifact to the degree that findings could be obscured. MRA HEAD FINDINGS Significant motion artifact intermittently, especially at the level of the circle-of-Willis. The covered vertebral, basilar, and carotid arteries are patent. A1, M1, and P1 segments are largely obscured by motion artifact. No detected branch occlusion or aneurysm. IMPRESSION: Brain MRI: 1. No acute intracranial finding.  No acute infarct. 2. Chronic small vessel disease and small remote cortical infarcts. 3. Posterior scalp contusion. 4. Moderate motion artifact Intracranial MRA: Motion artifact with multiple nondiagnostic slices. Unremarkable vessels where visible. Electronically Signed   By:  Tiburcio Pea M.D.   On: 11/25/2020 06:39   MR ANGIO HEAD WO CONTRAST Result Date: 11/25/2020 CLINICAL DATA:  TIA EXAM: MRI HEAD WITHOUT CONTRAST MRA HEAD WITHOUT CONTRAST TECHNIQUE: Multiplanar, multi-echo pulse sequences of the brain and surrounding structures were acquired without  intravenous contrast. Angiographic images of the Circle of Willis were acquired using MRA technique without intravenous contrast. COMPARISON:  Head CT from yesterday FINDINGS: MRI HEAD FINDINGS Brain: No acute infarction, hemorrhage, hydrocephalus, extra-axial collection or mass lesion. Remote bilateral occipital and left parietal cortically based infarcts. Chronic small vessel ischemia in the deep white matter which is mild for age. Nonspecific cerebral volume loss. Vascular: Major flow voids are preserved Skull and upper cervical spine: Posterior scalp swelling. No evidence of bone lesion. Sinuses/Orbits: Bilateral cataract resection.  No evidence of injury Other: Moderate motion artifact to the degree that findings could be obscured. MRA HEAD FINDINGS Significant motion artifact intermittently, especially at the level of the circle-of-Willis. The covered vertebral, basilar, and carotid arteries are patent. A1, M1, and P1 segments are largely obscured by motion artifact. No detected branch occlusion or aneurysm. IMPRESSION: Brain MRI: 1. No acute intracranial finding.  No acute infarct. 2. Chronic small vessel disease and small remote cortical infarcts. 3. Posterior scalp contusion. 4. Moderate motion artifact Intracranial MRA: Motion artifact with multiple nondiagnostic slices. Unremarkable vessels where visible. Electronically Signed   By: Tiburcio Pea M.D.   On: 11/25/2020 06:39   DG Pelvis Portable Result Date: 11/25/2020 CLINICAL DATA:  Fall at home. EXAM: PORTABLE PELVIS 1-2 VIEWS COMPARISON:  None. FINDINGS: There is no evidence of pelvic fracture or diastasis. No pelvic bone lesions are seen. Atherosclerosis and  osteopenia. IMPRESSION: No acute finding Electronically Signed   By: Tiburcio Pea M.D.   On: 11/25/2020 04:11   DG Abd Portable 1V Result Date: 11/25/2020 CLINICAL DATA:  Fall at home EXAM: PORTABLE ABDOMEN - 1 VIEW COMPARISON:  None. FINDINGS: Normal bowel gas pattern. No concerning mass effect or calcification. Generalized osteopenia and lumbar spine degeneration. L1 and L2 wedging, chronic at L2 when compared to a 2011 abdominal CT. Given the degree of T12-L1 spurring, L1 wedging is likely also chronic. IMPRESSION: Normal bowel gas pattern. Electronically Signed   By: Tiburcio Pea M.D.   On: 11/25/2020 04:11   EEG adult Result Date: 11/24/2020 Jefferson Fuel, MD     11/24/2020  8:40 PM Routine EEG Report DAISA LES is a 89 y.o. female with a history of spell who is undergoing an EEG to evaluate for seizures. Report: This EEG was acquired with electrodes placed according to the International 10-20 electrode system (including Fp1, Fp2, F3, F4, C3, C4, P3, P4, O1, O2, T3, T4, T5, T6, A1, A2, Fz, Cz, Pz). The following electrodes were missing or displaced: none. There was no waking occipital dominant rhythm. The best background was 6-7 Hz. This activity is reactive to stimulation. Sleep was identified by K complexes and sleep spindles. There was no focal slowing. There were no definitive interictal epileptiform discharges. There were no electrographic seizures identified. Photic stimulation and hyperventilation were not performed. Impression and clinical correlation: This EEG was obtained while asleep and is abnormal due to mild-to-moderate diffuse slowing. There were no electrographic seizures or definitive epileptiform abnormalities seen during this recording. Bing Neighbors, MD [facility] [phone] If 7pm- 7am, please page neurology on call as listed in AMION.   CT HEAD WO CONTRAST Result Date: 11/24/2020 CLINICAL DATA:  Altered mental status, fall EXAM: CT HEAD WITHOUT CONTRAST  TECHNIQUE: Contiguous axial images were obtained from the base of the skull through the vertex without intravenous contrast. COMPARISON:  CT head 08/02/2020 FINDINGS: Brain: There is no evidence of acute intracranial hemorrhage, extra-axial fluid collection, or acute infarct. A remote infarct  in the left parietal lobe is unchanged. Mild parenchymal volume loss and chronic white matter microangiopathy are unchanged. The ventricles are stable in size. There is no mass lesion. There is no midline shift. Vascular: There is calcification of the bilateral cavernous ICAs. Skull: Normal. Negative for fracture or focal lesion. Sinuses/Orbits: The paranasal sinuses are clear. The globes and orbits are unremarkable. Other: None. IMPRESSION: 1. No acute intracranial pathology. 2. Unchanged remote left parietal lobe infarct, parenchymal volume loss, and chronic white matter microangiopathy. Electronically Signed   By: Lesia Hausen M.D.   On: 11/24/2020 14:29   CT CERVICAL SPINE WO CONTRAST Result Date: 11/24/2020 CLINICAL DATA:  Trauma, fall EXAM: CT CERVICAL SPINE WITHOUT CONTRAST TECHNIQUE: Multidetector CT imaging of the cervical spine was performed without intravenous contrast. Multiplanar CT image reconstructions were also generated. COMPARISON:  CT cervical spine 08/02/2020 FINDINGS: Alignment: There is straightened curvature of the cervical spine with slight focal kyphosis centered at C5, similar to the prior study. There is grade 1 anterolisthesis of C3 on C4 and C4 on C5, also unchanged and likely degenerative in nature. There is no evidence of traumatic malalignment. There is no jumped or perched facet. Skull base and vertebrae: Skull base alignment is maintained. Vertebral body heights are preserved. There is no evidence of acute fracture. Soft tissues and spinal canal: No prevertebral fluid or swelling. No visible canal hematoma. Disc levels: There is marked intervertebral disc space narrowing at C5-C6, similar to  the prior study. Degenerative endplate changes also most advanced at this level. There is multilevel facet arthropathy, similar to the prior study. There is mild narrowing of the craniocervical junction. The osseous spinal canal is otherwise patent. Upper chest: There is mosaic attenuation in the lung apices with mild smooth interlobular septal thickening. Other: The left thyroid lobe is surgically absent. The soft tissues are otherwise unremarkable. IMPRESSION: 1. No acute fracture or traumatic malalignment of the cervical spine. 2. Multilevel degenerative changes as above, similar to the prior study. 3. Smooth interlobular septal thickening in the lung apices can be seen with pulmonary interstitial edema. Mosaic attenuation in the lung apices suggests air trapping/small airway disease. Electronically Signed   By: Lesia Hausen M.D.   On: 11/24/2020 14:22   DG Chest Portable 1 View Result Date: 11/24/2020 CLINICAL DATA:  Syncope. EXAM: PORTABLE CHEST 1 VIEW COMPARISON:  Jul 15, 2019 FINDINGS: Stable cardiomegaly. The hila and mediastinum are unremarkable. No pneumothorax. No overt edema or focal infiltrate. Nodular density projected over the right base is favored to represent confluence of shadows or atelectasis. No other acute abnormalities. IMPRESSION: A nodular density projected over the right base is favored to represent confluence of shadows or atelectasis. Recommend a PA and lateral chest x-[place] before discharge. No acute abnormalities are seen. Electronically Signed   By: Gerome Sam III M.D.   On: 11/24/2020 13:45    Assessment/Plan  Infected Sebaceous cyst  Pt does not seem to be in pain  Afebrile Noted 2 cm raised cyst with eryrthema and drainage Will start augmentin twice daily x 5 days  Will monitor    Care plan discussed with the nursing staff 30 min Total time spent for obtaining history,  performing a medically appropriate examination and evaluation, reviewing the tests,ordering   tests,  documenting clinical information in the electronic or other health record, independently interpreting results ,care coordination (not separately reported)

## 2023-03-23 ENCOUNTER — Encounter: Payer: Self-pay | Admitting: Nurse Practitioner

## 2023-03-23 ENCOUNTER — Non-Acute Institutional Stay (SKILLED_NURSING_FACILITY): Payer: Medicare Other | Admitting: Nurse Practitioner

## 2023-03-23 DIAGNOSIS — I509 Heart failure, unspecified: Secondary | ICD-10-CM | POA: Diagnosis not present

## 2023-03-23 DIAGNOSIS — N1831 Chronic kidney disease, stage 3a: Secondary | ICD-10-CM

## 2023-03-23 DIAGNOSIS — M159 Polyosteoarthritis, unspecified: Secondary | ICD-10-CM | POA: Diagnosis not present

## 2023-03-23 DIAGNOSIS — Z8673 Personal history of transient ischemic attack (TIA), and cerebral infarction without residual deficits: Secondary | ICD-10-CM | POA: Diagnosis not present

## 2023-03-23 DIAGNOSIS — I10 Essential (primary) hypertension: Secondary | ICD-10-CM

## 2023-03-23 DIAGNOSIS — G609 Hereditary and idiopathic neuropathy, unspecified: Secondary | ICD-10-CM

## 2023-03-23 DIAGNOSIS — E039 Hypothyroidism, unspecified: Secondary | ICD-10-CM

## 2023-03-23 NOTE — Assessment & Plan Note (Signed)
 CHF, 09/30/21 CXR mild congestive heart failure, CXR 10/25/21 showed CHF. Takes Furosemide 20mg  x2/wk, compensated clinically. Echo 06/08/08 EF 60%, DOE/cough on and off. BNP 18 11/25/22

## 2023-03-23 NOTE — Progress Notes (Signed)
 Location:   SNF [facility] Room Number: 63 Place of Service:  SNF (31) Provider: [facility] Korine Winton NP  Charlanne Fredia CROME, MD  Patient Care Team: Charlanne Fredia CROME, MD as PCP - General (Internal Medicine) Shelbylynn Walczyk X, NP as Nurse Practitioner (Internal Medicine) Charlanne Fredia CROME, MD as Consulting Physician (Internal Medicine) Charlanne Fredia CROME, MD (Internal Medicine)  Extended Emergency Contact Information Primary Emergency Contact: John,[name]  [place] Home Phone: [phone] Relation: Son Secondary Emergency Contact: Rieke,[name] Mobile Phone: [phone] Relation: Son  Code Status:  DNR Goals of care: Advanced Directive information    02/13/2023    3:37 PM  Advanced Directives  Does Patient Have a Medical Advance Directive? Yes  Type of Estate Agent of [place];Out of facility DNR (pink MOST or yellow form)  Does patient want to make changes to medical advance directive? No - Patient declined  Copy of Healthcare Power of Attorney in Chart? Yes - validated most recent copy scanned in chart (See row information)  Pre-existing out of facility DNR order (yellow form or pink MOST form) Pink MOST form placed in chart (order not valid for inpatient use);Yellow form placed in chart (order not valid for inpatient use)     Chief Complaint  Patient presents with   Medical Management of Chronic Issues    HPI:  Pt is a 89 y.o. female seen today for medical management of chronic diseases.   CHF, 09/30/21 CXR mild congestive heart failure, CXR 10/25/21 showed CHF. Takes Furosemide  20mg  x2/wk, compensated clinically. Echo 06/08/08 EF 60%, DOE/cough on and off. BNP 24 02/17/23             Peripheral neuropathy, taking Gabapentin , Vit B12 Gait abnormality, uses walker to ambulate slowly.       Hx of TIA. Allergic to statin. On ASA.  HPOA desired no hospital evaluation. Seizure like activity, likely post fall, off Tramadol , not taking anti seizure meds.  OA:  T5, T8, L3 compression fxs. Takes, Tylenol , Gabapentin  for pain, off Tramadol , DDD per CT head/cervical spine 08/02/20              Hx of CVA per CT head 08/02/20 Remote infarcts within the a occipital cortices bilaterally again noted. Interval development of remote infarcts within the left parietal lobe and periventricular white matte. On ASA            Dementia, 07/15/19 CT head Mild generalized parenchymal atrophy and chronic small vessel ischemic disease, tolerated Memantine  well. 06/22/22 MMSE 17/30             HTN, blood pressure is controlled on Benazepril , Amlodipine .              CKD Bun/creat 31/1.4 02/24/23             Gout stable, on Allopurinol                    Hypothyroidism, Bun/creat 31/1.4 02/24/23             Abnormal CXR, suggested CT chest, not candidate for workup     Past Medical History:  Diagnosis Date   Cognitive changes    Dementia (HCC)    Depression    Gout    Hypertension    Hypothyroidism    Peripheral neuropathy    History reviewed. No pertinent surgical history.  Allergies  Allergen Reactions   Actonel [Risedronate Sodium]    Actonel [Risedronate] Other (See Comments)    Allergic,  per [facility]   Hct [Hydrochlorothiazide]    Hydrochlorothiazide Other (See Comments)    Allergic, per [facility]   Lipitor [Atorvastatin Calcium]    Lipitor [Atorvastatin] Other (See Comments)    Allergic, per [facility]   Neomycin Other (See Comments)    Unknown been so long ago, a Dermatologist told me   Neomycin Other (See Comments)    Allergic, per [facility]   Polysporin [Bacitracin-Polymyxin B]    Polysporin [Bacitracin-Polymyxin B] Other (See Comments)    Allergic, per [facility]   Prolia  [Denosumab ]    Prolia  [Denosumab ] Other (See Comments)   Sulfa Antibiotics    Sulfa Antibiotics Other (See Comments)    Allergic, per [facility]   Zocor [Simvastatin]    Zocor [Simvastatin] Other (See Comments)    Allergic, per [facility]    Allergies as of 03/23/2023       Reactions   Actonel  [risedronate Sodium]    Actonel [risedronate] Other (See Comments)   Allergic, per [facility]   Hct [hydrochlorothiazide]    Hydrochlorothiazide Other (See Comments)   Allergic, per [facility]   Lipitor [atorvastatin Calcium]    Lipitor [atorvastatin] Other (See Comments)   Allergic, per [facility]   Neomycin Other (See Comments)   Unknown been so long ago, a Dermatologist told me   Neomycin Other (See Comments)   Allergic, per [facility]   Polysporin [bacitracin-polymyxin B]    Polysporin [bacitracin-polymyxin B] Other (See Comments)   Allergic, per [facility]   Prolia  [denosumab ]    Prolia  [denosumab ] Other (See Comments)   Sulfa Antibiotics    Sulfa Antibiotics Other (See Comments)   Allergic, per [facility]   Zocor [simvastatin]    Zocor [simvastatin] Other (See Comments)   Allergic, per [facility]        Medication List        Accurate as of March 23, 2023 11:59 PM. If you have any questions, ask your nurse or doctor.          acetaminophen  325 MG tablet Commonly known as: TYLENOL  Take 650 mg by mouth in the morning, at noon, and at bedtime.   allopurinol  100 MG tablet Commonly known as: ZYLOPRIM  Take 100 mg by mouth daily.   amLODipine  2.5 MG tablet Commonly known as: NORVASC  Take 2.5 mg by mouth daily.   amoxicillin-clavulanate 500-125 MG tablet Commonly known as: AUGMENTIN Take 1 tablet by mouth 2 (two) times daily.   aspirin  81 MG chewable tablet Chew 81 mg by mouth daily.   benazepril  5 MG tablet Commonly known as: LOTENSIN  Take 5 mg by mouth daily.   Calcium Carb-Cholecalciferol 500-400 MG-UNIT Tabs Take 2 tablets by mouth daily.   furosemide  40 MG tablet Commonly known as: LASIX  Take 40 mg by mouth daily.   furosemide  20 MG tablet Commonly known as: LASIX  Take 20 mg by mouth 2 (two) times a week. Take one 20mg  tablet every Mon, thu for fluid retention   gabapentin  100 MG capsule Commonly known as: NEURONTIN  Take 200 mg by mouth at bedtime.   levothyroxine  100  MCG tablet Commonly known as: SYNTHROID  Take 100 mcg by mouth once a week. On Wednesday   levothyroxine  50 MCG tablet Commonly known as: SYNTHROID  Take 50 mcg by mouth daily before breakfast. Sun,Mon,Tue, Thur,Fri,Sat   memantine  10 MG tablet Commonly known as: NAMENDA  Take 10 mg by mouth 2 (two) times daily.   potassium chloride  10 MEQ tablet Commonly known as: KLOR-CON  M Take 1 tablet (10 mEq total) by mouth 2 (two) times daily.  Vitamin D  50 MCG (2000 UT) Caps Take 2,000 Units by mouth daily.        Review of Systems  Constitutional:  Negative for appetite change, fatigue and fever.  HENT:  Positive for hearing loss. Negative for congestion and voice change.   Eyes:  Negative for visual disturbance.  Respiratory:  Positive for shortness of breath. Negative for cough and wheezing.        DOE  Cardiovascular:  Positive for leg swelling.  Gastrointestinal:  Negative for abdominal pain and constipation.  Genitourinary:  Negative for dysuria and urgency.  Musculoskeletal:  Positive for arthralgias, back pain and gait problem.       Chronic, aches.   Skin:  Negative for color change.  Neurological:  Negative for speech difficulty, weakness and light-headedness.  Psychiatric/Behavioral:  Positive for confusion. Negative for behavioral problems and sleep disturbance. The patient is not nervous/anxious.     Immunization History  Administered Date(s) Administered   Fluad Quad(high Dose 65+) 01/01/2022   Influenza, High Dose Seasonal PF 12/11/2018, 01/01/2022, 12/10/2022   Influenza-Unspecified 03/24/2018, 12/21/2019, 12/26/2020   Moderna Covid-19 Vaccine  Bivalent Booster 73yrs & up 01/09/2022, 12/24/2022   Moderna SARS-COV2 Booster Vaccination 02/20/2021, 07/26/2021   Moderna Sars-Covid-2 Vaccination 03/12/2019, 04/09/2019, 04/21/2019, 01/17/2020, 08/07/2020   Pneumococcal Conjugate-13 09/09/2012, 10/28/2013   Pneumococcal Polysaccharide-23 04/02/2009, 04/16/2009   Tdap  07/15/2019   Zoster Recombinant(Shingrix) 04/16/2009, 06/20/2022   Zoster, Live 04/16/2009   Zoster, Unspecified 04/16/2009   Pertinent  Health Maintenance Due  Topic Date Due   DEXA SCAN  07/02/2023 (Originally 01/08/1990)   INFLUENZA VACCINE  Completed      01/07/2022    9:04 AM 02/06/2022   10:48 AM 04/21/2022    3:50 PM 06/12/2022   10:12 AM 06/12/2022   12:58 PM  Fall Risk  Falls in the past year? 0 0 0 0 1  Was there an injury with Fall? 0 0 0 0 0  Fall Risk Category Calculator 0 0 0 0 2  Fall Risk Category (Retired) Low Low     (RETIRED) Patient Fall Risk Level Moderate fall risk Moderate fall risk     Patient at Risk for Falls Due to History of fall(s) History of fall(s) No Fall Risks No Fall Risks History of fall(s);Impaired balance/gait;Impaired mobility  Fall risk Follow up Falls evaluation completed Falls evaluation completed Falls evaluation completed Falls evaluation completed Falls evaluation completed;Education provided;Falls prevention discussed;Follow up appointment   Functional Status Survey:    Vitals:   03/23/23 1629 03/26/23 1216  BP: (!) 144/69 (!) 144/69  Pulse: 77   Resp: 20   Temp: (!) 97.5 F (36.4 C)   SpO2: 97%   Weight: 169 lb 4.8 oz (76.8 kg)    Body mass index is 29.06 kg/m. Physical Exam Vitals and nursing note reviewed.  Constitutional:      Appearance: Normal appearance.  HENT:     Head: Normocephalic and atraumatic.     Nose: Nose normal.     Mouth/Throat:     Mouth: Mucous membranes are moist.  Eyes:     Extraocular Movements: Extraocular movements intact.     Conjunctiva/sclera: Conjunctivae normal.     Pupils: Pupils are equal, round, and reactive to light.  Cardiovascular:     Rate and Rhythm: Normal rate and regular rhythm.     Heart sounds: No murmur heard.    Comments: Weak dorsalis pedis pulses R+L from previous examination.  Pulmonary:     Effort: Pulmonary effort is  normal.     Breath sounds: Rales present. No  wheezing or rhonchi.     Comments: Bibasilar rales.  Abdominal:     General: Bowel sounds are normal.     Palpations: Abdomen is soft.     Tenderness: There is no abdominal tenderness.  Musculoskeletal:        General: No tenderness.     Cervical back: Normal range of motion and neck supple.     Right lower leg: Edema present.     Left lower leg: Edema present.     Comments: trace edema BLE.   Skin:    General: Skin is warm and dry.  Neurological:     General: No focal deficit present.     Mental Status: She is alert. Mental status is at baseline.     Gait: Gait abnormal.     Comments: Ambulates with walker.   Psychiatric:        Mood and Affect: Mood normal.     Labs reviewed: Recent Labs    11/25/22 0000 02/12/23 0000 02/24/23 0000  NA 137 141 140  K 4.0 3.9 4.1  CL 104 102 108  CO2 23* 25* 23*  BUN 38* 39* 31*  CREATININE 1.5* 1.8* 1.4*  CALCIUM 8.8 9.2 8.8   Recent Labs    05/06/22 1200 06/05/22 0600 02/12/23 0000  AST 13  --  17  ALT 10  --  14  ALKPHOS 89 3.9* 98  ALBUMIN 4.3 3.4* 4.0   Recent Labs    05/06/22 1200 11/25/22 0000 02/10/23 0000  WBC 9.5 9.7 8.8  NEUTROABS 7,011.00  --  6,961.00  HGB 12.8 12.1 13.0  HCT 38 36 39  PLT 227 267 205   Lab Results  Component Value Date   TSH 2.34 06/22/2020   No results found for: HGBA1C No results found for: CHOL, HDL, LDLCALC, LDLDIRECT, TRIG, CHOLHDL  Significant Diagnostic Results in last 30 days:  No results found.  Assessment/Plan  Congestive heart failure (CHF) (HCC) CHF, 09/30/21 CXR mild congestive heart failure, CXR 10/25/21 showed CHF. Takes Furosemide  20mg  x2/wk, compensated clinically. Echo 06/08/08 EF 60%, DOE/cough on and off. BNP 18 11/25/22  Peripheral neuropathy  taking Gabapentin , Vit B12, ambulates with walker.   Generalized osteoarthritis of multiple sites OA: T5, T8, L3 compression fxs. Takes, Tylenol , Gabapentin  for pain, off Tramadol , DDD per CT head/cervical  spine 08/02/20   History of CVA (cerebrovascular accident) Hx of CVA per CT head 08/02/20 Remote infarcts within the a occipital cortices bilaterally again noted. Interval development of remote infarcts within the left parietal lobe and periventricular white matte. On ASA  HTN (hypertension) Loose Bp control,  on Benazepril , Amlodipine .  CKD (chronic kidney disease) stage 3, GFR 30-59 ml/min (HCC) Bun/creat 31/1.4 02/24/23  Hypothyroidism Bun/creat 31/1.4 02/24/23   Family/ staff Communication: plan of care reviewed with the patient and charge nurse.   Labs/tests ordered:  none  Time spend 30 minutes.

## 2023-03-23 NOTE — Assessment & Plan Note (Signed)
 Bun/creat 31/1.4 02/24/23

## 2023-03-23 NOTE — Assessment & Plan Note (Signed)
Hx of CVA per CT head 08/02/20 Remote infarcts within the a occipital cortices bilaterally again noted. Interval development of remote infarcts within the left parietal lobe and periventricular white matte. On ASA 

## 2023-03-23 NOTE — Assessment & Plan Note (Signed)
taking Gabapentin, Vit B12, ambulates with walker.

## 2023-03-23 NOTE — Assessment & Plan Note (Addendum)
Loose Bp control,  on Benazepril, Amlodipine.

## 2023-03-23 NOTE — Assessment & Plan Note (Signed)
OA: T5, T8, L3 compression fxs. Takes, Tylenol, Gabapentin for pain, off Tramadol, DDD per CT head/cervical spine 08/02/20

## 2023-04-20 ENCOUNTER — Non-Acute Institutional Stay (SKILLED_NURSING_FACILITY): Payer: Self-pay | Admitting: Nurse Practitioner

## 2023-04-20 ENCOUNTER — Encounter: Payer: Self-pay | Admitting: Nurse Practitioner

## 2023-04-20 DIAGNOSIS — G609 Hereditary and idiopathic neuropathy, unspecified: Secondary | ICD-10-CM

## 2023-04-20 DIAGNOSIS — I10 Essential (primary) hypertension: Secondary | ICD-10-CM | POA: Diagnosis not present

## 2023-04-20 DIAGNOSIS — N1831 Chronic kidney disease, stage 3a: Secondary | ICD-10-CM | POA: Diagnosis not present

## 2023-04-20 DIAGNOSIS — M109 Gout, unspecified: Secondary | ICD-10-CM | POA: Diagnosis not present

## 2023-04-20 DIAGNOSIS — E039 Hypothyroidism, unspecified: Secondary | ICD-10-CM

## 2023-04-20 DIAGNOSIS — M159 Polyosteoarthritis, unspecified: Secondary | ICD-10-CM

## 2023-04-20 DIAGNOSIS — I509 Heart failure, unspecified: Secondary | ICD-10-CM

## 2023-04-20 DIAGNOSIS — Z8673 Personal history of transient ischemic attack (TIA), and cerebral infarction without residual deficits: Secondary | ICD-10-CM

## 2023-04-20 DIAGNOSIS — R2681 Unsteadiness on feet: Secondary | ICD-10-CM

## 2023-04-20 DIAGNOSIS — F0390 Unspecified dementia without behavioral disturbance: Secondary | ICD-10-CM

## 2023-04-20 NOTE — Assessment & Plan Note (Signed)
per CT head 08/02/20 Remote infarcts within the a occipital cortices bilaterally again noted. Interval development of remote infarcts within the left parietal lobe and periventricular white matte. On ASA

## 2023-04-20 NOTE — Assessment & Plan Note (Signed)
taking Gabapentin, Vit B12

## 2023-04-20 NOTE — Assessment & Plan Note (Signed)
 Bun/creat 31/1.4 02/24/23

## 2023-04-20 NOTE — Assessment & Plan Note (Signed)
 Compensated clinically,  09/30/21 CXR mild congestive heart failure, CXR 10/25/21 showed CHF. Takes Furosemide  20mg  x2/wk. Echo 06/08/08 EF 60%, DOE/cough on and off. BNP 24 02/17/23

## 2023-04-20 NOTE — Assessment & Plan Note (Signed)
uses walker to ambulate slowly.     

## 2023-04-20 NOTE — Assessment & Plan Note (Signed)
stable, on Allopurinol       

## 2023-04-20 NOTE — Assessment & Plan Note (Signed)
 on Levothyroxine , TSH 3.25 07/03/22

## 2023-04-20 NOTE — Assessment & Plan Note (Signed)
 blood pressure is controlled on Benazepril

## 2023-04-20 NOTE — Assessment & Plan Note (Signed)
 T5, T8, L3 compression fxs. Takes Tylenol  and Gabapentin for pain, off Tramadol , Gabapentin, DDD per CT head/cervical spine 08/02/20

## 2023-04-20 NOTE — Progress Notes (Signed)
 Location:   [facility] Room Number: 95 Place of Service:  SNF (31) Provider: [facility] Kassity Woodson NP  Marguerite Shiley, MD  Patient Care Team: Marguerite Shiley, MD as PCP - General (Internal Medicine) Shavelle Runkel X, NP as Nurse Practitioner (Internal Medicine) Marguerite Shiley, MD as Consulting Physician (Internal Medicine) Marguerite Shiley, MD (Internal Medicine)  Extended Emergency Contact Information Primary Emergency Contact: Oppenheimer,[name]  [place] Home Phone: [phone] Relation: Son Secondary Emergency Contact: Hamm,[name] Mobile Phone: [phone] Relation: Son  Code Status:  DNR Goals of care: Advanced Directive information    02/13/2023    3:37 PM  Advanced Directives  Does Patient Have a Medical Advance Directive? Yes  Type of Estate agent of [place];Out of facility DNR (pink MOST or yellow form)  Does patient want to make changes to medical advance directive? No - Patient declined  Copy of Healthcare Power of Attorney in Chart? Yes - validated most recent copy scanned in chart (See row information)  Pre-existing out of facility DNR order (yellow form or pink MOST form) Pink MOST form placed in chart (order not valid for inpatient use);Yellow form placed in chart (order not valid for inpatient use)     Chief Complaint  Patient presents with   Medical Management of Chronic Issues    HPI:  Pt is a 89 y.o. female seen today for medical management of chronic diseases.     CHF, 09/30/21 CXR mild congestive heart failure, CXR 10/25/21 showed CHF. Takes Furosemide  20mg  x2/wk, compensated clinically. Echo 06/08/08 EF 60%, DOE/cough on and off. BNP 24 02/17/23             Peripheral neuropathy, taking Gabapentin, Vit B12 Gait abnormality, uses walker to ambulate slowly.       Hx of TIA. Allergic to statin. On ASA.  HPOA desired no hospital evaluation. Seizure like activity, likely post fall, off Tramadol , not taking anti seizure meds.   OA: T5, T8, L3 compression fxs. Takes Tylenol  and Gabapentin for pain, off Tramadol , Gabapentin, DDD per CT head/cervical spine 08/02/20              Hx of CVA per CT head 08/02/20 Remote infarcts within the a occipital cortices bilaterally again noted. Interval development of remote infarcts within the left parietal lobe and periventricular white matte. On ASA            Dementia, 07/15/19 CT head Mild generalized parenchymal atrophy and chronic small vessel ischemic disease, tolerated Memantine  well. 06/22/22 MMSE 17/30             HTN, blood pressure is controlled on Benazepril              CKD Bun/creat 31/1.4 02/24/23             Gout, stable, on Allopurinol                    Hypothyroidism, on Levothyroxine , TSH 3.25 07/03/22             Abnormal CXR, suggested CT chest, not candidate for workup     Past Medical History:  Diagnosis Date   Cognitive changes    Dementia (HCC)    Depression    Gout    Hypertension    Hypothyroidism    Peripheral neuropathy    History reviewed. No pertinent surgical history.  Allergies  Allergen Reactions   Actonel [Risedronate Sodium]    Actonel [Risedronate] Other (See Comments)    "  Allergic," per [facility]   Hct [Hydrochlorothiazide]    Hydrochlorothiazide Other (See Comments)    "Allergic," per [facility]   Lipitor [Atorvastatin Calcium]    Lipitor [Atorvastatin] Other (See Comments)    "Allergic," per [facility]   Neomycin Other (See Comments)    Unknown "been so long ago, a Dermatologist told me"   Neomycin Other (See Comments)    "Allergic," per [facility]   Polysporin [Bacitracin-Polymyxin B]    Polysporin [Bacitracin-Polymyxin B] Other (See Comments)    "Allergic," per [facility]   Prolia  [Denosumab ]    Prolia  [Denosumab ] Other (See Comments)   Sulfa Antibiotics    Sulfa Antibiotics Other (See Comments)    "Allergic," per [facility]   Zocor [Simvastatin]    Zocor [Simvastatin] Other (See Comments)    "Allergic," per [facility]    Allergies as of 04/20/2023       Reactions    Actonel [risedronate Sodium]    Actonel [risedronate] Other (See Comments)   "Allergic," per [facility]   Hct [hydrochlorothiazide]    Hydrochlorothiazide Other (See Comments)   "Allergic," per [facility]   Lipitor [atorvastatin Calcium]    Lipitor [atorvastatin] Other (See Comments)   "Allergic," per [facility]   Neomycin Other (See Comments)   Unknown "been so long ago, a Dermatologist told me"   Neomycin Other (See Comments)   "Allergic," per [facility]   Polysporin [bacitracin-polymyxin B]    Polysporin [bacitracin-polymyxin B] Other (See Comments)   "Allergic," per [facility]   Prolia  [denosumab ]    Prolia  [denosumab ] Other (See Comments)   Sulfa Antibiotics    Sulfa Antibiotics Other (See Comments)   "Allergic," per [facility]   Zocor [simvastatin]    Zocor [simvastatin] Other (See Comments)   "Allergic," per [facility]        Medication List        Accurate as of April 20, 2023  3:57 PM. If you have any questions, ask your nurse or doctor.          acetaminophen  325 MG tablet Commonly known as: TYLENOL  Take 650 mg by mouth in the morning, at noon, and at bedtime.   allopurinol  100 MG tablet Commonly known as: ZYLOPRIM  Take 100 mg by mouth daily.   amLODipine  2.5 MG tablet Commonly known as: NORVASC  Take 2.5 mg by mouth daily.   amoxicillin-clavulanate 500-125 MG tablet Commonly known as: AUGMENTIN Take 1 tablet by mouth 2 (two) times daily.   aspirin  81 MG chewable tablet Chew 81 mg by mouth daily.   benazepril  5 MG tablet Commonly known as: LOTENSIN  Take 5 mg by mouth daily.   Calcium Carb-Cholecalciferol 500-400 MG-UNIT Tabs Take 2 tablets by mouth daily.   furosemide  40 MG tablet Commonly known as: LASIX  Take 40 mg by mouth daily.   furosemide  20 MG tablet Commonly known as: LASIX  Take 20 mg by mouth 2 (two) times a week. Take one 20mg  tablet every Mon, thu for fluid retention   gabapentin 100 MG capsule Commonly known as: NEURONTIN Take 200 mg by mouth at bedtime.    levothyroxine  100 MCG tablet Commonly known as: SYNTHROID  Take 100 mcg by mouth once a week. On Wednesday   levothyroxine  50 MCG tablet Commonly known as: SYNTHROID  Take 50 mcg by mouth daily before breakfast. Sun,Mon,Tue, Thur,Fri,Sat   memantine  10 MG tablet Commonly known as: NAMENDA  Take 10 mg by mouth 2 (two) times daily.   potassium chloride  10 MEQ tablet Commonly known as: KLOR-CON  M Take 1 tablet (10 mEq total) by mouth 2 (two)  times daily.   Vitamin D  50 MCG (2000 UT) Caps Take 2,000 Units by mouth daily.        Review of Systems  Constitutional:  Negative for appetite change, fatigue and fever.  HENT:  Positive for hearing loss. Negative for congestion and voice change.   Eyes:  Negative for visual disturbance.  Respiratory:  Positive for shortness of breath. Negative for cough and wheezing.        DOE  Cardiovascular:  Positive for leg swelling.  Gastrointestinal:  Negative for abdominal pain and constipation.  Genitourinary:  Negative for dysuria and urgency.  Musculoskeletal:  Positive for arthralgias, back pain and gait problem.       Chronic, aches.   Skin:  Negative for color change.  Neurological:  Negative for speech difficulty, weakness and light-headedness.  Psychiatric/Behavioral:  Positive for confusion. Negative for behavioral problems and sleep disturbance. The patient is not nervous/anxious.     Immunization History  Administered Date(s) Administered   Fluad Quad(high Dose 65+) 01/01/2022   Influenza, High Dose Seasonal PF 12/11/2018, 01/01/2022, 12/10/2022   Influenza-Unspecified 03/24/2018, 12/21/2019, 12/26/2020   Moderna Covid-19 Vaccine  Bivalent Booster 50yrs & up 01/09/2022, 12/24/2022   Moderna SARS-COV2 Booster Vaccination 02/20/2021, 07/26/2021   Moderna Sars-Covid-2 Vaccination 03/12/2019, 04/09/2019, 04/21/2019, 01/17/2020, 08/07/2020   Pneumococcal Conjugate-13 09/09/2012, 10/28/2013   Pneumococcal Polysaccharide-23 04/02/2009,  04/16/2009   Tdap 07/15/2019   Zoster Recombinant(Shingrix) 04/16/2009, 06/20/2022   Zoster, Live 04/16/2009   Zoster, Unspecified 04/16/2009   Pertinent  Health Maintenance Due  Topic Date Due   DEXA SCAN  07/02/2023 (Originally 01/08/1992)   INFLUENZA VACCINE  Completed      01/07/2022    9:04 AM 02/06/2022   10:48 AM 04/21/2022    3:50 PM 06/12/2022   10:12 AM 06/12/2022   12:58 PM  Fall Risk  Falls in the past year? 0 0 0 0 1  Was there an injury with Fall? 0 0 0 0 0  Fall Risk Category Calculator 0 0 0 0 2  Fall Risk Category (Retired) Low Low     (RETIRED) Patient Fall Risk Level Moderate fall risk Moderate fall risk     Patient at Risk for Falls Due to History of fall(s) History of fall(s) No Fall Risks No Fall Risks History of fall(s);Impaired balance/gait;Impaired mobility  Fall risk Follow up Falls evaluation completed Falls evaluation completed Falls evaluation completed Falls evaluation completed Falls evaluation completed;Education provided;Falls prevention discussed;Follow up appointment   Functional Status Survey:    Vitals:   04/20/23 1400  BP: (!) 145/65  Pulse: 75  Resp: 18  Temp: 98.2 F (36.8 C)  SpO2: 94%  Weight: 164 lb 9.6 oz (74.7 kg)   Body mass index is 28.25 kg/m. Physical Exam Vitals and nursing note reviewed.  Constitutional:      Appearance: Normal appearance.  HENT:     Head: Normocephalic and atraumatic.     Nose: Nose normal.     Mouth/Throat:     Mouth: Mucous membranes are moist.  Eyes:     Extraocular Movements: Extraocular movements intact.     Conjunctiva/sclera: Conjunctivae normal.     Pupils: Pupils are equal, round, and reactive to light.  Cardiovascular:     Rate and Rhythm: Normal rate and regular rhythm.     Heart sounds: No murmur heard.    Comments: Weak dorsalis pedis pulses R+L from previous examination.  Pulmonary:     Effort: Pulmonary effort is normal.     Breath  sounds: Rales present.     Comments: Bibasilar  rales.  Abdominal:     General: Bowel sounds are normal.     Palpations: Abdomen is soft.     Tenderness: There is no abdominal tenderness.  Musculoskeletal:        General: No tenderness.     Cervical back: Normal range of motion and neck supple.     Right lower leg: Edema present.     Left lower leg: Edema present.     Comments: trace edema BLE.   Skin:    General: Skin is warm and dry.  Neurological:     General: No focal deficit present.     Mental Status: She is alert. Mental status is at baseline.     Gait: Gait abnormal.     Comments: Ambulates with walker.   Psychiatric:        Mood and Affect: Mood normal.        Behavior: Behavior normal.     Labs reviewed: Recent Labs    11/25/22 0000 02/12/23 0000 02/24/23 0000  NA 137 141 140  K 4.0 3.9 4.1  CL 104 102 108  CO2 23* 25* 23*  BUN 38* 39* 31*  CREATININE 1.5* 1.8* 1.4*  CALCIUM 8.8 9.2 8.8   Recent Labs    05/06/22 1200 06/05/22 0600 02/12/23 0000  AST 13  --  17  ALT 10  --  14  ALKPHOS 89 3.9* 98  ALBUMIN 4.3 3.4* 4.0   Recent Labs    05/06/22 1200 11/25/22 0000 02/10/23 0000  WBC 9.5 9.7 8.8  NEUTROABS 7,011.00  --  6,961.00  HGB 12.8 12.1 13.0  HCT 38 36 39  PLT 227 267 205   Lab Results  Component Value Date   TSH 2.34 06/22/2020   No results found for: "HGBA1C" No results found for: "CHOL", "HDL", "LDLCALC", "LDLDIRECT", "TRIG", "CHOLHDL"  Significant Diagnostic Results in last 30 days:  No results found.  Assessment/Plan  Hypothyroidism on Levothyroxine , TSH 3.25 07/03/22  Gout stable, on Allopurinol         CKD (chronic kidney disease) stage 3, GFR 30-59 ml/min (HCC) Bun/creat 31/1.4 02/24/23  HTN (hypertension) blood pressure is controlled on Benazepril   Senile dementia (HCC) 07/15/19 CT head Mild generalized parenchymal atrophy and chronic small vessel ischemic disease, tolerated Memantine  well. 06/22/22 MMSE 17/30  History of CVA (cerebrovascular accident) per CT  head 08/02/20 Remote infarcts within the a occipital cortices bilaterally again noted. Interval development of remote infarcts within the left parietal lobe and periventricular white matte. On ASA  Generalized osteoarthritis of multiple sites T5, T8, L3 compression fxs. Takes Tylenol  and Gabapentin for pain, off Tramadol , Gabapentin, DDD per CT head/cervical spine 08/02/20   Gait instability  uses walker to ambulate slowly.        Peripheral neuropathy  taking Gabapentin, Vit B12   Family/ staff Communication: plan of care reviewed with the patient and charge nurse.   Labs/tests ordered:  none

## 2023-04-20 NOTE — Assessment & Plan Note (Signed)
07/15/19 CT head Mild generalized parenchymal atrophy and chronic small vessel ischemic disease, tolerated Memantine well. 06/22/22 MMSE 17/30

## 2023-04-22 ENCOUNTER — Non-Acute Institutional Stay (SKILLED_NURSING_FACILITY): Payer: Self-pay | Admitting: Adult Health

## 2023-04-22 DIAGNOSIS — J101 Influenza due to other identified influenza virus with other respiratory manifestations: Secondary | ICD-10-CM | POA: Diagnosis not present

## 2023-04-22 DIAGNOSIS — I129 Hypertensive chronic kidney disease with stage 1 through stage 4 chronic kidney disease, or unspecified chronic kidney disease: Secondary | ICD-10-CM | POA: Diagnosis not present

## 2023-04-22 DIAGNOSIS — E039 Hypothyroidism, unspecified: Secondary | ICD-10-CM

## 2023-04-22 LAB — CBC AND DIFFERENTIAL
HCT: 36 (ref 36–46)
Hemoglobin: 1.8 — AB (ref 12.0–16.0)
Neutrophils Absolute: 7933
Platelets: 186 10*3/uL (ref 150–400)
WBC: 9.3

## 2023-04-22 LAB — BASIC METABOLIC PANEL WITH GFR
BUN: 21 (ref 4–21)
CO2: 24 — AB (ref 13–22)
Chloride: 103 (ref 99–108)
Creatinine: 1.3 — AB (ref 0.5–1.1)
Glucose: 104
Potassium: 4 meq/L (ref 3.5–5.1)
Sodium: 137 (ref 137–147)

## 2023-04-22 LAB — COMPREHENSIVE METABOLIC PANEL WITH GFR: Calcium: 8.8 (ref 8.7–10.7)

## 2023-04-22 LAB — CBC: RBC: 3.59 — AB (ref 3.87–5.11)

## 2023-04-22 MED ORDER — OSELTAMIVIR PHOSPHATE 75 MG PO CAPS
75.0000 mg | ORAL_CAPSULE | Freq: Every day | ORAL | 0 refills | Status: AC
Start: 2023-04-22 — End: 2023-04-23

## 2023-04-22 MED ORDER — OSELTAMIVIR PHOSPHATE 75 MG PO CAPS: 75.0000 mg | ORAL_CAPSULE | Freq: Two times a day (BID) | ORAL | 0 refills | Status: DC

## 2023-04-22 MED ORDER — OSELTAMIVIR PHOSPHATE 30 MG PO CAPS: 30.0000 mg | ORAL_CAPSULE | Freq: Two times a day (BID) | ORAL | 0 refills | Status: AC

## 2023-04-22 NOTE — Progress Notes (Signed)
Location:  [facility] Room Number: 31 Place of Service:  SNF (31) Provider:  Kenard Gower, DNP, FNP-BC  Patient Care Team: Mahlon Gammon, MD as PCP - General (Internal Medicine) Mast, Man X, NP as Nurse Practitioner (Internal Medicine) Mahlon Gammon, MD as Consulting Physician (Internal Medicine) Mahlon Gammon, MD (Internal Medicine)  Extended Emergency Contact Information Primary Emergency Contact: Mechele Claude of [place] Home Phone: [phone] Relation: Son Secondary Emergency Contact: Kuriakose,[name] Mobile Phone: [phone] Relation: Son  Code Status:   DNR  Goals of care: Advanced Directive information    02/13/2023    3:37 PM  Advanced Directives  Does Patient Have a Medical Advance Directive? Yes  Type of Estate agent of [place];Out of facility DNR (pink MOST or yellow form)  Does patient want to make changes to medical advance directive? No - Patient declined  Copy of Healthcare Power of Attorney in Chart? Yes - validated most recent copy scanned in chart (See row information)  Pre-existing out of facility DNR order (yellow form or pink MOST form) Pink MOST form placed in chart (order not valid for inpatient use);Yellow form placed in chart (order not valid for inpatient use)     Chief Complaint  Patient presents with   Acute Visit    Coughing wheezing    HPI:  Pt is a 89 y.o. female seen today for an acute visit regarding wheezing and cough. She is a resident of [facility]. COVID-19 and Influenza A/B tests were negative yesterday. She was noted to be coughing with wheezing on bilateral lung fields. She has poor appetite. Latest GFR 34, 12//17/24, ranging as chronic kidney disease stage 3b.  BP 126/69,  takes Benazepril 5 mg daily for hypertension  She takes Levothyroxine for hypothyroidism   Past Medical History:  Diagnosis Date   Cognitive changes    Dementia (HCC)     Depression    Gout    Hypertension    Hypothyroidism    Peripheral neuropathy    No past surgical history on file.  Allergies  Allergen Reactions   Actonel [Risedronate Sodium]    Actonel [Risedronate] Other (See Comments)    "Allergic," per [facility]   Hct [Hydrochlorothiazide]    Hydrochlorothiazide Other (See Comments)    "Allergic," per [facility]   Lipitor [Atorvastatin Calcium]    Lipitor [Atorvastatin] Other (See Comments)    "Allergic," per [facility]   Neomycin Other (See Comments)    Unknown "been so long ago, a Dermatologist told me"   Neomycin Other (See Comments)    "Allergic," per [facility]   Polysporin [Bacitracin-Polymyxin B]    Polysporin [Bacitracin-Polymyxin B] Other (See Comments)    "Allergic," per [facility]   Prolia [Denosumab]    Prolia [Denosumab] Other (See Comments)   Sulfa Antibiotics    Sulfa Antibiotics Other (See Comments)    "Allergic," per [facility]   Zocor [Simvastatin]    Zocor [Simvastatin] Other (See Comments)    "Allergic," per [facility]    Outpatient Encounter Medications as of 04/22/2023  Medication Sig   oseltamivir (TAMIFLU) 30 MG capsule Take 1 capsule (30 mg total) by mouth 2 (two) times daily for 4 days. Starting 04/23/23   oseltamivir (TAMIFLU) 75 MG capsule Take 1 capsule (75 mg total) by mouth daily for 1 day. Then Oseltamivir 30 mg BID starting day 2, 04/23/23   [DISCONTINUED] oseltamivir (TAMIFLU) 75 MG capsule Take 1 capsule (75 mg total) by mouth 2 (two)  times daily for 5 days.   acetaminophen (TYLENOL) 325 MG tablet Take 650 mg by mouth in the morning, at noon, and at bedtime.   allopurinol (ZYLOPRIM) 100 MG tablet Take 100 mg by mouth daily.   amLODipine (NORVASC) 2.5 MG tablet Take 2.5 mg by mouth daily. (Patient not taking: Reported on 03/05/2023)   amoxicillin-clavulanate (AUGMENTIN) 500-125 MG tablet Take 1 tablet by mouth 2 (two) times daily.   aspirin 81 MG chewable tablet Chew 81 mg by mouth daily.   benazepril (LOTENSIN) 5 MG tablet Take 5 mg by mouth  daily.   Calcium Carb-Cholecalciferol 500-400 MG-UNIT TABS Take 2 tablets by mouth daily.    Cholecalciferol (VITAMIN D) 50 MCG (2000 UT) CAPS Take 2,000 Units by mouth daily.   furosemide (LASIX) 20 MG tablet Take 20 mg by mouth 2 (two) times a week. Take one 20mg  tablet every Mon, thu for fluid retention   furosemide (LASIX) 40 MG tablet Take 40 mg by mouth daily.   gabapentin (NEURONTIN) 100 MG capsule Take 200 mg by mouth at bedtime.   levothyroxine (SYNTHROID) 100 MCG tablet Take 100 mcg by mouth once a week. On Wednesday   levothyroxine (SYNTHROID) 50 MCG tablet Take 50 mcg by mouth daily before breakfast. Sun,Mon,Tue, Thur,Fri,Sat   memantine (NAMENDA) 10 MG tablet Take 10 mg by mouth 2 (two) times daily.   potassium chloride (KLOR-CON M) 10 MEQ tablet Take 1 tablet (10 mEq total) by mouth 2 (two) times daily.   No facility-administered encounter medications on file as of 04/22/2023.    Review of Systems  Constitutional:  Negative for appetite change, chills, fatigue and fever.  HENT:  Negative for congestion, hearing loss, rhinorrhea and sore throat.   Eyes: Negative.   Respiratory:  Positive for cough. Negative for shortness of breath and wheezing.   Cardiovascular:  Negative for chest pain, palpitations and leg swelling.  Gastrointestinal:  Negative for abdominal pain, constipation, diarrhea, nausea and vomiting.  Genitourinary:  Negative for dysuria.  Musculoskeletal:  Negative for arthralgias, back pain and myalgias.  Skin:  Negative for color change, rash and wound.  Neurological:  Negative for dizziness, weakness and headaches.  Psychiatric/Behavioral:  Negative for behavioral problems. The patient is not nervous/anxious.      Immunization History  Administered Date(s) Administered   Fluad Quad(high Dose 65+) 01/01/2022   Influenza, High Dose Seasonal PF 12/11/2018, 01/01/2022, 12/10/2022   Influenza-Unspecified 03/24/2018, 12/21/2019, 12/26/2020   Moderna Covid-19  Vaccine Bivalent Booster 70yrs & up 01/09/2022, 12/24/2022   Moderna SARS-COV2 Booster Vaccination 02/20/2021, 07/26/2021   Moderna Sars-Covid-2 Vaccination 03/12/2019, 04/09/2019, 04/21/2019, 01/17/2020, 08/07/2020   Pneumococcal Conjugate-13 09/09/2012, 10/28/2013   Pneumococcal Polysaccharide-23 04/02/2009, 04/16/2009   Tdap 07/15/2019   Zoster Recombinant(Shingrix) 04/16/2009, 06/20/2022   Zoster, Live 04/16/2009   Zoster, Unspecified 04/16/2009   Pertinent  Health Maintenance Due  Topic Date Due   DEXA SCAN  07/02/2023 (Originally 01/08/1992)   INFLUENZA VACCINE  Completed      01/07/2022    9:04 AM 02/06/2022   10:48 AM 04/21/2022    3:50 PM 06/12/2022   10:12 AM 06/12/2022   12:58 PM  Fall Risk  Falls in the past year? 0 0 0 0 1  Was there an injury with Fall? 0 0 0 0 0  Fall Risk Category Calculator 0 0 0 0 2  Fall Risk Category (Retired) Low Low     (RETIRED) Patient Fall Risk Level Moderate fall risk Moderate fall risk  Patient at Risk for Falls Due to History of fall(s) History of fall(s) No Fall Risks No Fall Risks History of fall(s);Impaired balance/gait;Impaired mobility  Fall risk Follow up Falls evaluation completed Falls evaluation completed Falls evaluation completed Falls evaluation completed Falls evaluation completed;Education provided;Falls prevention discussed;Follow up appointment     Vitals:   04/22/23 1124  BP: 126/69  Pulse: 88  Resp: 18  Temp: 98.2 F (36.8 C)  SpO2: 94%  Weight: 164 lb 9.6 oz (74.7 kg)  Height: 5\' 4"  (1.626 m)   Body mass index is 28.25 kg/m.  Physical Exam Constitutional:      Appearance: Normal appearance.  HENT:     Head: Normocephalic and atraumatic.     Nose: Nose normal.     Mouth/Throat:     Mouth: Mucous membranes are moist.  Eyes:     Conjunctiva/sclera: Conjunctivae normal.  Cardiovascular:     Rate and Rhythm: Normal rate and regular rhythm.  Pulmonary:     Effort: Pulmonary effort is normal.      Breath sounds: Wheezing present.  Abdominal:     General: Bowel sounds are normal.     Palpations: Abdomen is soft.  Musculoskeletal:     Cervical back: Normal range of motion.  Skin:    General: Skin is warm and dry.  Neurological:     Mental Status: She is alert.  Psychiatric:        Mood and Affect: Mood normal.        Behavior: Behavior normal.      Labs reviewed: Recent Labs    11/25/22 0000 02/12/23 0000 02/24/23 0000  NA 137 141 140  K 4.0 3.9 4.1  CL 104 102 108  CO2 23* 25* 23*  BUN 38* 39* 31*  CREATININE 1.5* 1.8* 1.4*  CALCIUM 8.8 9.2 8.8   Recent Labs    05/06/22 1200 06/05/22 0600 02/12/23 0000  AST 13  --  17  ALT 10  --  14  ALKPHOS 89 3.9* 98  ALBUMIN 4.3 3.4* 4.0   Recent Labs    05/06/22 1200 11/25/22 0000 02/10/23 0000  WBC 9.5 9.7 8.8  NEUTROABS 7,011.00  --  6,961.00  HGB 12.8 12.1 13.0  HCT 38 36 39  PLT 227 267 205   Lab Results  Component Value Date   TSH 2.34 06/22/2020   No results found for: "HGBA1C" No results found for: "CHOL", "HDL", "LDLCALC", "LDLDIRECT", "TRIG", "CHOLHDL"  Significant Diagnostic Results in last 30 days:  No results found.  Assessment/Plan  1. Influenza A (Primary) -  positive test for Influenza A -  will start Oseltamivir renally dosed -  will start Guaifenesin 100 mg/5 ml give 10 ml = 200 mg PO Q 6AM, 2 PM and 10 PM X 1 week for cough - oseltamivir (TAMIFLU) 75 MG capsule; Take 1 capsule (75 mg total) by mouth daily for 1 day. Then Oseltamivir 30 mg BID starting day 2, 04/23/23  Dispense: 1 capsule; Refill: 0 - oseltamivir (TAMIFLU) 30 MG capsule; Take 1 capsule (30 mg total) by mouth 2 (two) times daily for 4 days. Starting 04/23/23  Dispense: 8 capsule; Refill: 0  2. Hypertension with chronic kidney disease -  BP stable -  continue Benazepril  3. Hypothyroidism, unspecified type Lab Results  Component Value Date   TSH 2.34 06/22/2020    -  continue Levothyroxine   Family/ staff  Communication: Discussed plan of care with resident and charge nurse.  Labs/tests ordered:  BMP, CBC and chest x-[place] PA and lateral    Kenard Gower, DNP, MSN, FNP-BC [facility] [phone] (Monday-Friday 8:00 a.m. - 5:00 p.m.) [phone] (after hours)

## 2023-05-18 ENCOUNTER — Non-Acute Institutional Stay (SKILLED_NURSING_FACILITY): Payer: Self-pay | Admitting: Sports Medicine

## 2023-05-18 ENCOUNTER — Encounter: Payer: Self-pay | Admitting: Sports Medicine

## 2023-05-18 DIAGNOSIS — M25552 Pain in left hip: Secondary | ICD-10-CM

## 2023-05-18 DIAGNOSIS — E559 Vitamin D deficiency, unspecified: Secondary | ICD-10-CM

## 2023-05-18 DIAGNOSIS — G609 Hereditary and idiopathic neuropathy, unspecified: Secondary | ICD-10-CM

## 2023-05-18 DIAGNOSIS — E039 Hypothyroidism, unspecified: Secondary | ICD-10-CM | POA: Diagnosis not present

## 2023-05-18 DIAGNOSIS — F0390 Unspecified dementia without behavioral disturbance: Secondary | ICD-10-CM

## 2023-05-18 DIAGNOSIS — M25551 Pain in right hip: Secondary | ICD-10-CM

## 2023-05-18 DIAGNOSIS — M109 Gout, unspecified: Secondary | ICD-10-CM

## 2023-05-18 DIAGNOSIS — N1831 Chronic kidney disease, stage 3a: Secondary | ICD-10-CM

## 2023-05-18 DIAGNOSIS — I10 Essential (primary) hypertension: Secondary | ICD-10-CM

## 2023-05-18 DIAGNOSIS — I509 Heart failure, unspecified: Secondary | ICD-10-CM

## 2023-05-18 MED ORDER — FUROSEMIDE 20 MG PO TABS: 20.0000 mg | ORAL_TABLET | ORAL | Status: DC

## 2023-05-18 NOTE — Progress Notes (Signed)
 Provider:  Dr. Venita Sheffield Location:  [facility] Place of Service:   Skilled care   PCP: Mahlon Gammon, MD Patient Care Team: Mahlon Gammon, MD as PCP - General (Internal Medicine) Mast, Man X, NP as Nurse Practitioner (Internal Medicine) Mahlon Gammon, MD as Consulting Physician (Internal Medicine) Mahlon Gammon, MD (Internal Medicine)  Extended Emergency Contact Information Primary Emergency Contact: Mechele Claude of [place] Home Phone: [phone] Relation: Son Secondary Emergency Contact: Charley,[name] Mobile Phone: [phone] Relation: Son  Goals of Care: Advanced Directive information    02/13/2023    3:37 PM  Advanced Directives  Does Patient Have a Medical Advance Directive? Yes  Type of Estate agent of [place];Out of facility DNR (pink MOST or yellow form)  Does patient want to make changes to medical advance directive? No - Patient declined  Copy of Healthcare Power of Attorney in Chart? Yes - validated most recent copy scanned in chart (See row information)  Pre-existing out of facility DNR order (yellow form or pink MOST form) Pink MOST form placed in chart (order not valid for inpatient use);Yellow form placed in chart (order not valid for inpatient use)        History of Present Illness       89 year old female with a history of dementia, congestive heart failure, peripheral neuropathy, history of TIA, osteoarthritis, hypertension, CKD, hypothyroidism was evaluated for the chronic disease management.  Patient seen and examined in her room.  She is sitting up recliner chair.  She opens her eyes on calling her name.  She answers most of the question but is not no answers.  She knows her name, not oriented to time or place. She cannot remember what she had for breakfast. As per nursing staff patient is able to ambulate with a rollator walker.  No agitation reported. Staff informed that this morning she  complained of pain in her hips but she denied any pain during the interview with me today. No falls as per staff. She lost 9 pounds since january  05/09/2023 12:49 160.7 Lbs (Standing) 04/11/2023 11:52 164.6 Lbs (Wheelchair) 03/11/2023 14:35 169.3 Lbs (Standing) 02/16/2023 10:32 167.9 Lbs (Standing   BASIC METABOLIC PANEL GLUCOSE 104 mg/dL 65-784 Final  Non-fasting reference interval UREA NITROGEN (BUN) 21 mg/dL 6-96 Final CREATININE 2.95 mg/dL 2.84-1.32 H Final EGFR 39 mL/min/1.73 m2 > OR = 60 L Final BUN/CREATININE RATIO 17 (calc) 6-22 Final SODIUM 137 mmol/L 135-146 Final POTASSIUM 4.0 mmol/L 3.5-5.3 Final CHLORIDE 103 mmol/L 98-110 Final CARBON DIOXIDE 24 mmol/L 20-32 Final CALCIUM 8.8 mg/dL 4.4-01.0 Final   CBC (INCLUDES DIFF/PLT) WHITE BLOOD CELL COUNT 9.3 Thousand/u L 3.8-10.8 Final RED BLOOD CELL COUNT 3.59 Million/uL 3.80-5.10 L Final HEMOGLOBIN 11.8 g/dL 27.2-53.6 Final HEMATOCRIT 36.0 % 35.0-45.0 Final MCV 100.3 fL 80.0-100.0 H Final MCH 32.9 pg 27.0-33.0 Final MCHC 32.8 g/dL 64.4-03.4 Final   Past Medical History:  Diagnosis Date   Cognitive changes    Dementia (HCC)    Depression    Gout    Hypertension    Hypothyroidism    Peripheral neuropathy    No past surgical history on file.  reports that she has an unknown smoking status. She has never used smokeless tobacco. She reports that she does not drink alcohol and does not use drugs. Social History   Socioeconomic History   Marital status: Widowed    Spouse name: Not on file   Number of children: Not on file   Years of  education: Not on file   Highest education level: Not on file  Occupational History   Not on file  Tobacco Use   Smoking status: Unknown   Smokeless tobacco: Never  Vaping Use   Vaping status: Never Used  Substance and Sexual Activity   Alcohol use: Never   Drug use: Never   Sexual activity: Not on file  Other Topics Concern   Not on file  Social History Narrative    ** Merged History Encounter **       Social Drivers of Health   Financial Resource Strain: Not on file  Food Insecurity: Not on file  Transportation Needs: Not on file  Physical Activity: Not on file  Stress: Not on file  Social Connections: Not on file  Intimate Partner Violence: Not on file    Functional Status Survey:    No family history on file.  Health Maintenance  Topic Date Due   COVID-19 Vaccine (10 - 2024-25 season) 02/18/2023   Medicare Annual Wellness (AWV)  06/12/2023   DEXA SCAN  07/02/2023 (Originally [birth year]/1992)   DTaP/Tdap/Td (2 - Td or Tdap) 07/14/2029   Pneumonia Vaccine 58+ Years old  Completed   INFLUENZA VACCINE  Completed   Zoster Vaccines- Shingrix  Completed   HPV VACCINES  Aged Out    Allergies  Allergen Reactions   Actonel [Risedronate Sodium]    Actonel [Risedronate] Other (See Comments)    "Allergic," per [facility]   Hct [Hydrochlorothiazide]    Hydrochlorothiazide Other (See Comments)    "Allergic," per [facility]   Lipitor [Atorvastatin Calcium]    Lipitor [Atorvastatin] Other (See Comments)    "Allergic," per [facility]   Neomycin Other (See Comments)    Unknown "been so long ago, a Dermatologist told me"   Neomycin Other (See Comments)    "Allergic," per [facility]   Polysporin [Bacitracin-Polymyxin B]    Polysporin [Bacitracin-Polymyxin B] Other (See Comments)    "Allergic," per MAR   Prolia [Denosumab]    Prolia [Denosumab] Other (See Comments)   Sulfa Antibiotics    Sulfa Antibiotics Other (See Comments)    "Allergic," per [facility]   Zocor [Simvastatin]    Zocor [Simvastatin] Other (See Comments)    "Allergic," per [facility]    Outpatient Encounter Medications as of 05/18/2023  Medication Sig   acetaminophen (TYLENOL) 325 MG tablet Take 650 mg by mouth in the morning, at noon, and at bedtime.   allopurinol (ZYLOPRIM) 100 MG tablet Take 100 mg by mouth daily.   amLODipine (NORVASC) 2.5 MG tablet Take 2.5 mg by mouth daily. (Patient not taking: Reported on  03/05/2023)   amoxicillin-clavulanate (AUGMENTIN) 500-125 MG tablet Take 1 tablet by mouth 2 (two) times daily.   aspirin 81 MG chewable tablet Chew 81 mg by mouth daily.   benazepril (LOTENSIN) 5 MG tablet Take 5 mg by mouth daily.   Calcium Carb-Cholecalciferol 500-400 MG-UNIT TABS Take 2 tablets by mouth daily.    Cholecalciferol (VITAMIN D) 50 MCG (2000 UT) CAPS Take 2,000 Units by mouth daily.   furosemide (LASIX) 20 MG tablet Take 20 mg by mouth 2 (two) times a week. Take one 20mg  tablet every Mon, thu for fluid retention   furosemide (LASIX) 40 MG tablet Take 40 mg by mouth daily.   gabapentin (NEURONTIN) 100 MG capsule Take 200 mg by mouth at bedtime.   levothyroxine (SYNTHROID) 100 MCG tablet Take 100 mcg by mouth once a week. On Wednesday   levothyroxine (SYNTHROID) 50 MCG tablet Take 50  mcg by mouth daily before breakfast. Sun,Mon,Tue, Thur,Fri,Sat   memantine (NAMENDA) 10 MG tablet Take 10 mg by mouth 2 (two) times daily.   potassium chloride (KLOR-CON M) 10 MEQ tablet Take 1 tablet (10 mEq total) by mouth 2 (two) times daily.   No facility-administered encounter medications on file as of 05/18/2023.    Review of Systems  Unable to perform ROS: Dementia  Constitutional:  Negative for fever.  Respiratory:  Negative for cough and shortness of breath.   Cardiovascular:  Negative for leg swelling.  Musculoskeletal:  Positive for arthralgias.  Psychiatric/Behavioral:  Negative for agitation.    Negative unless indicated in HPI.  There were no vitals filed for this visit. There is no height or weight on file to calculate BMI. BP Readings from Last 3 Encounters:  04/22/23 126/69  04/20/23 (!) 145/65  03/26/23 (!) 144/69   Wt Readings from Last 3 Encounters:  04/22/23 164 lb 9.6 oz (74.7 kg)  04/20/23 164 lb 9.6 oz (74.7 kg)  03/23/23 169 lb 4.8 oz (76.8 kg)   Physical Exam Constitutional:      Appearance: Normal appearance.  HENT:     Head: Normocephalic and atraumatic.   Cardiovascular:     Rate and Rhythm: Normal rate and regular rhythm.  Pulmonary:     Effort: Pulmonary effort is normal. No respiratory distress.     Breath sounds: Normal breath sounds. No wheezing.  Abdominal:     General: Bowel sounds are normal. There is no distension.     Tenderness: There is no abdominal tenderness. There is no guarding or rebound.     Comments:    Musculoskeletal:        General: No swelling or tenderness.  Neurological:     Mental Status: She is alert. Mental status is at baseline.     Motor: No weakness.     Labs reviewed: Basic Metabolic Panel: Recent Labs    11/25/22 0000 02/12/23 0000 02/24/23 0000  NA 137 141 140  K 4.0 3.9 4.1  CL 104 102 108  CO2 23* 25* 23*  BUN 38* 39* 31*  CREATININE 1.5* 1.8* 1.4*  CALCIUM 8.8 9.2 8.8   Liver Function Tests: Recent Labs    06/05/22 0600 02/12/23 0000  AST  --  17  ALT  --  14  ALKPHOS 3.9* 98  ALBUMIN 3.4* 4.0   No results for input(s): "LIPASE", "AMYLASE" in the last 8760 hours. No results for input(s): "AMMONIA" in the last 8760 hours. CBC: Recent Labs    11/25/22 0000 02/10/23 0000  WBC 9.7 8.8  NEUTROABS  --  6,961.00  HGB 12.1 13.0  HCT 36 39  PLT 267 205   Cardiac Enzymes: No results for input(s): "CKTOTAL", "CKMB", "CKMBINDEX", "TROPONINI" in the last 8760 hours. BNP: Invalid input(s): "POCBNP" No results found for: "HGBA1C" Lab Results  Component Value Date   TSH 2.34 06/22/2020   No results found for: "VITAMINB12" No results found for: "FOLATE" No results found for: "IRON", "TIBC", "FERRITIN"  Imaging and Procedures obtained prior to SNF admission: MR BRAIN WO CONTRAST Result Date: 11/25/2020 CLINICAL DATA:  TIA EXAM: MRI HEAD WITHOUT CONTRAST MRA HEAD WITHOUT CONTRAST TECHNIQUE: Multiplanar, multi-echo pulse sequences of the brain and surrounding structures were acquired without intravenous contrast. Angiographic images of the Circle of Willis were acquired using MRA  technique without intravenous contrast. COMPARISON:  Head CT from yesterday FINDINGS: MRI HEAD FINDINGS Brain: No acute infarction, hemorrhage, hydrocephalus, extra-axial collection or mass  lesion. Remote bilateral occipital and left parietal cortically based infarcts. Chronic small vessel ischemia in the deep white matter which is mild for age. Nonspecific cerebral volume loss. Vascular: Major flow voids are preserved Skull and upper cervical spine: Posterior scalp swelling. No evidence of bone lesion. Sinuses/Orbits: Bilateral cataract resection.  No evidence of injury Other: Moderate motion artifact to the degree that findings could be obscured. MRA HEAD FINDINGS Significant motion artifact intermittently, especially at the level of the circle-of-Willis. The covered vertebral, basilar, and carotid arteries are patent. A1, M1, and P1 segments are largely obscured by motion artifact. No detected branch occlusion or aneurysm. IMPRESSION: Brain MRI: 1. No acute intracranial finding.  No acute infarct. 2. Chronic small vessel disease and small remote cortical infarcts. 3. Posterior scalp contusion. 4. Moderate motion artifact Intracranial MRA: Motion artifact with multiple nondiagnostic slices. Unremarkable vessels where visible. Electronically Signed   By: Tiburcio Pea M.D.   On: 11/25/2020 06:39   MR ANGIO HEAD WO CONTRAST Result Date: 11/25/2020 CLINICAL DATA:  TIA EXAM: MRI HEAD WITHOUT CONTRAST MRA HEAD WITHOUT CONTRAST TECHNIQUE: Multiplanar, multi-echo pulse sequences of the brain and surrounding structures were acquired without intravenous contrast. Angiographic images of the Circle of Willis were acquired using MRA technique without intravenous contrast. COMPARISON:  Head CT from yesterday FINDINGS: MRI HEAD FINDINGS Brain: No acute infarction, hemorrhage, hydrocephalus, extra-axial collection or mass lesion. Remote bilateral occipital and left parietal cortically based infarcts. Chronic small vessel  ischemia in the deep white matter which is mild for age. Nonspecific cerebral volume loss. Vascular: Major flow voids are preserved Skull and upper cervical spine: Posterior scalp swelling. No evidence of bone lesion. Sinuses/Orbits: Bilateral cataract resection.  No evidence of injury Other: Moderate motion artifact to the degree that findings could be obscured. MRA HEAD FINDINGS Significant motion artifact intermittently, especially at the level of the circle-of-Willis. The covered vertebral, basilar, and carotid arteries are patent. A1, M1, and P1 segments are largely obscured by motion artifact. No detected branch occlusion or aneurysm. IMPRESSION: Brain MRI: 1. No acute intracranial finding.  No acute infarct. 2. Chronic small vessel disease and small remote cortical infarcts. 3. Posterior scalp contusion. 4. Moderate motion artifact Intracranial MRA: Motion artifact with multiple nondiagnostic slices. Unremarkable vessels where visible. Electronically Signed   By: Tiburcio Pea M.D.   On: 11/25/2020 06:39   DG Pelvis Portable Result Date: 11/25/2020 CLINICAL DATA:  Fall at home. EXAM: PORTABLE PELVIS 1-2 VIEWS COMPARISON:  None. FINDINGS: There is no evidence of pelvic fracture or diastasis. No pelvic bone lesions are seen. Atherosclerosis and osteopenia. IMPRESSION: No acute finding Electronically Signed   By: Tiburcio Pea M.D.   On: 11/25/2020 04:11   DG Abd Portable 1V Result Date: 11/25/2020 CLINICAL DATA:  Fall at home EXAM: PORTABLE ABDOMEN - 1 VIEW COMPARISON:  None. FINDINGS: Normal bowel gas pattern. No concerning mass effect or calcification. Generalized osteopenia and lumbar spine degeneration. L1 and L2 wedging, chronic at L2 when compared to a 2011 abdominal CT. Given the degree of T12-L1 spurring, L1 wedging is likely also chronic. IMPRESSION: Normal bowel gas pattern. Electronically Signed   By: Tiburcio Pea M.D.   On: 11/25/2020 04:11   EEG adult Result Date: 11/24/2020 Jefferson Fuel, MD     11/24/2020  8:40 PM Routine EEG Report GUSTAVA BERLAND is a 89 y.o. female with a history of spell who is undergoing an EEG to evaluate for seizures. Report: This EEG was acquired with electrodes  placed according to the International 10-20 electrode system (including Fp1, Fp2, F3, F4, C3, C4, P3, P4, O1, O2, T3, T4, T5, T6, A1, A2, Fz, Cz, Pz). The following electrodes were missing or displaced: none. There was no waking occipital dominant rhythm. The best background was 6-7 Hz. This activity is reactive to stimulation. Sleep was identified by K complexes and sleep spindles. There was no focal slowing. There were no definitive interictal epileptiform discharges. There were no electrographic seizures identified. Photic stimulation and hyperventilation were not performed. Impression and clinical correlation: This EEG was obtained while asleep and is abnormal due to mild-to-moderate diffuse slowing. There were no electrographic seizures or definitive epileptiform abnormalities seen during this recording. Bing Neighbors, MD [facility] [phone] If 7pm- 7am, please page neurology on call as listed in AMION.   CT HEAD WO CONTRAST Result Date: 11/24/2020 CLINICAL DATA:  Altered mental status, fall EXAM: CT HEAD WITHOUT CONTRAST TECHNIQUE: Contiguous axial images were obtained from the base of the skull through the vertex without intravenous contrast. COMPARISON:  CT head 08/02/2020 FINDINGS: Brain: There is no evidence of acute intracranial hemorrhage, extra-axial fluid collection, or acute infarct. A remote infarct in the left parietal lobe is unchanged. Mild parenchymal volume loss and chronic white matter microangiopathy are unchanged. The ventricles are stable in size. There is no mass lesion. There is no midline shift. Vascular: There is calcification of the bilateral cavernous ICAs. Skull: Normal. Negative for fracture or focal lesion. Sinuses/Orbits: The paranasal sinuses are clear.  The globes and orbits are unremarkable. Other: None. IMPRESSION: 1. No acute intracranial pathology. 2. Unchanged remote left parietal lobe infarct, parenchymal volume loss, and chronic white matter microangiopathy. Electronically Signed   By: Lesia Hausen M.D.   On: 11/24/2020 14:29   CT CERVICAL SPINE WO CONTRAST Result Date: 11/24/2020 CLINICAL DATA:  Trauma, fall EXAM: CT CERVICAL SPINE WITHOUT CONTRAST TECHNIQUE: Multidetector CT imaging of the cervical spine was performed without intravenous contrast. Multiplanar CT image reconstructions were also generated. COMPARISON:  CT cervical spine 08/02/2020 FINDINGS: Alignment: There is straightened curvature of the cervical spine with slight focal kyphosis centered at C5, similar to the prior study. There is grade 1 anterolisthesis of C3 on C4 and C4 on C5, also unchanged and likely degenerative in nature. There is no evidence of traumatic malalignment. There is no jumped or perched facet. Skull base and vertebrae: Skull base alignment is maintained. Vertebral body heights are preserved. There is no evidence of acute fracture. Soft tissues and spinal canal: No prevertebral fluid or swelling. No visible canal hematoma. Disc levels: There is marked intervertebral disc space narrowing at C5-C6, similar to the prior study. Degenerative endplate changes also most advanced at this level. There is multilevel facet arthropathy, similar to the prior study. There is mild narrowing of the craniocervical junction. The osseous spinal canal is otherwise patent. Upper chest: There is mosaic attenuation in the lung apices with mild smooth interlobular septal thickening. Other: The left thyroid lobe is surgically absent. The soft tissues are otherwise unremarkable. IMPRESSION: 1. No acute fracture or traumatic malalignment of the cervical spine. 2. Multilevel degenerative changes as above, similar to the prior study. 3. Smooth interlobular septal thickening in the lung apices can  be seen with pulmonary interstitial edema. Mosaic attenuation in the lung apices suggests air trapping/small airway disease. Electronically Signed   By: Lesia Hausen M.D.   On: 11/24/2020 14:22   DG Chest Portable 1 View Result Date: 11/24/2020 CLINICAL DATA:  Syncope. EXAM:  PORTABLE CHEST 1 VIEW COMPARISON:  Jul 15, 2019 FINDINGS: Stable cardiomegaly. The hila and mediastinum are unremarkable. No pneumothorax. No overt edema or focal infiltrate. Nodular density projected over the right base is favored to represent confluence of shadows or atelectasis. No other acute abnormalities. IMPRESSION: A nodular density projected over the right base is favored to represent confluence of shadows or atelectasis. Recommend a PA and lateral chest x-Oddo before discharge. No acute abnormalities are seen. Electronically Signed   By: Gerome Sam III M.D.   On: 11/24/2020 13:45    Assessment and Plan  Major neurocognitive disorder  No agitation as per nursing staff Continue with supportive care Increase cognitively engaging activities Continue with Namenda  Weight loss Patient lost 6 pounds since January Will check a TSH Will monitor weights If ongoing weight loss , will consider stopping namenda  Hip pain Staff reported that patient complained of pain in her hip not sure of the location When examined patient denied pain. Questionable fall Will get x-Heinrichs of bilateral hip Continue with Tylenol as needed for pain  Hypertension  blood pressure at goal  Blood pressure 129/70 Continue with benazepril  CKD stage IIIa Creatinine 1.26 Avoid nephrotoxic medications Increase oral hydration  Chronic gout Stable Continue with allopurinol  Congestive heart failure Lungs clear lower extremity swelling very minimal Continue with Lasix, potassium supplements Avoid salty foods   Hypothyroidism Will check TSH Continue with levothyroxine   Vitamin D deficiency Will check vitamin D Continue with  vitamin D supplements.   Neuropathy Cont with gabapentin        30 min Total time spent for obtaining history,  performing a medically appropriate examination and evaluation, reviewing the tests, documenting clinical information in the electronic or other health record, ,care coordination (not separately reported)

## 2023-05-19 LAB — VITAMIN D 25 HYDROXY (VIT D DEFICIENCY, FRACTURES): Vit D, 25-Hydroxy: 61

## 2023-05-19 LAB — TSH: TSH: 2.48 (ref 0.41–5.90)

## 2023-06-25 ENCOUNTER — Encounter: Payer: Self-pay | Admitting: Nurse Practitioner

## 2023-06-25 ENCOUNTER — Non-Acute Institutional Stay (SKILLED_NURSING_FACILITY): Payer: Self-pay | Admitting: Nurse Practitioner

## 2023-06-25 DIAGNOSIS — E039 Hypothyroidism, unspecified: Secondary | ICD-10-CM

## 2023-06-25 DIAGNOSIS — G609 Hereditary and idiopathic neuropathy, unspecified: Secondary | ICD-10-CM

## 2023-06-25 DIAGNOSIS — Z66 Do not resuscitate: Secondary | ICD-10-CM | POA: Diagnosis not present

## 2023-06-25 DIAGNOSIS — I10 Essential (primary) hypertension: Secondary | ICD-10-CM

## 2023-06-25 DIAGNOSIS — M109 Gout, unspecified: Secondary | ICD-10-CM

## 2023-06-25 DIAGNOSIS — F0390 Unspecified dementia without behavioral disturbance: Secondary | ICD-10-CM

## 2023-06-25 DIAGNOSIS — N1831 Chronic kidney disease, stage 3a: Secondary | ICD-10-CM

## 2023-06-25 DIAGNOSIS — I509 Heart failure, unspecified: Secondary | ICD-10-CM

## 2023-06-25 DIAGNOSIS — M159 Polyosteoarthritis, unspecified: Secondary | ICD-10-CM

## 2023-06-25 NOTE — Assessment & Plan Note (Signed)
07/15/19 CT head Mild generalized parenchymal atrophy and chronic small vessel ischemic disease, tolerated Memantine well. 06/22/22 MMSE 17/30

## 2023-06-25 NOTE — Assessment & Plan Note (Signed)
 Bun/creat 21/1.3 04/22/23

## 2023-06-25 NOTE — Assessment & Plan Note (Signed)
 Peripheral neuropathy, taking Gabapentin, Vit B12 Gait abnormality, uses walker to ambulate slowly.

## 2023-06-25 NOTE — Assessment & Plan Note (Signed)
stable, on Allopurinol       

## 2023-06-25 NOTE — Assessment & Plan Note (Signed)
 CHF, 09/30/21 CXR mild congestive heart failure, CXR 10/25/21 showed CHF. Takes Furosemide 20mg  x2/wk, compensated clinically. Echo 06/08/08 EF 60%, DOE/cough on and off. BNP 24 02/17/23

## 2023-06-25 NOTE — Progress Notes (Signed)
 Location:  [facility] Room Number: [phone] Place of Service:  SNF (31) Provider:  [facility] Arlana Canizales, N.P.   Patient Care Team: Tye Gall, MD as PCP - General (Internal Medicine) Dewayne Jurek X, NP as Nurse Practitioner (Internal Medicine) Marguerite Shiley, MD as Consulting Physician (Internal Medicine) Marguerite Shiley, MD (Internal Medicine)  Extended Emergency Contact Information Primary Emergency Contact: Ellery,[name]  [place] Home Phone: [phone] Relation: Son Secondary Emergency Contact: Hosie,[name] Mobile Phone: [phone] Relation: Son  Code Status:  DNR Goals of care: Advanced Directive information    02/13/2023    3:37 PM  Advanced Directives  Does Patient Have a Medical Advance Directive? Yes  Type of Estate agent of [place];Out of facility DNR (pink MOST or yellow form)  Does patient want to make changes to medical advance directive? No - Patient declined  Copy of Healthcare Power of Attorney in Chart? Yes - validated most recent copy scanned in chart (See row information)  Pre-existing out of facility DNR order (yellow form or pink MOST form) Pink MOST form placed in chart (order not valid for inpatient use);Yellow form placed in chart (order not valid for inpatient use)     Chief Complaint  Patient presents with   Medical Management of Chronic Issues    Routine visit. Discuss need for annual wellness visit     HPI:  Pt is a 89 y.o. female seen today for medical management of chronic diseases.   CHF, 09/30/21 CXR mild congestive heart failure, CXR 10/25/21 showed CHF. Takes Furosemide  20mg  x2/wk, compensated clinically. Echo 06/08/08 EF 60%, DOE/cough on and off. BNP 24 02/17/23             Peripheral neuropathy, taking Gabapentin, Vit B12 Gait abnormality, uses walker to ambulate slowly.       Hx of TIA. Allergic to statin. On ASA.  HPOA desired no hospital evaluation. Seizure like activity,  likely post fall, off Tramadol , not taking anti seizure meds.  OA: T5, T8, L3 compression fxs. Takes Tylenol  and Gabapentin for pain, off Tramadol , Gabapentin, DDD per CT head/cervical spine 08/02/20, Vit D 61 05/19/23             Hx of CVA per CT head 08/02/20 Remote infarcts within the a occipital cortices bilaterally again noted. Interval development of remote infarcts within the left parietal lobe and periventricular white matte. On ASA            Dementia, 07/15/19 CT head Mild generalized parenchymal atrophy and chronic small vessel ischemic disease, tolerated Memantine  well. 06/22/22 MMSE 17/30             HTN, blood pressure is controlled on Benazepril              CKD Bun/creat 21/1.3 04/22/23             Gout, stable, on Allopurinol                    Hypothyroidism, on Levothyroxine , TSH 2.48 05/19/23             Abnormal CXR, suggested CT chest, not candidate for workup     Past Medical History:  Diagnosis Date   Cognitive changes    Dementia (HCC)    Depression    Gout    Hypertension    Hypothyroidism    Peripheral neuropathy    History reviewed. No pertinent surgical history.  Allergies  Allergen Reactions   Actonel [  Risedronate Sodium]    Actonel [Risedronate] Other (See Comments)    "Allergic," per [facility]   Hct [Hydrochlorothiazide]    Hydrochlorothiazide Other (See Comments)    "Allergic," per [facility]   Lipitor [Atorvastatin Calcium]    Lipitor [Atorvastatin] Other (See Comments)    "Allergic," per [facility]   Neomycin Other (See Comments)    Unknown "been so long ago, a Dermatologist told me"   Neomycin Other (See Comments)    "Allergic," per [facility]   Polysporin [Bacitracin-Polymyxin B]    Polysporin [Bacitracin-Polymyxin B] Other (See Comments)    "Allergic," per [facility]   Prolia  [Denosumab ]    Prolia  [Denosumab ] Other (See Comments)   Sulfa Antibiotics    Sulfa Antibiotics Other (See Comments)    "Allergic," per [facility]   Zocor [Simvastatin]    Zocor [Simvastatin] Other (See  Comments)    "Allergic," per [facility]    Outpatient Encounter Medications as of 06/25/2023  Medication Sig   acetaminophen  (TYLENOL ) 325 MG tablet Take 650 mg by mouth in the morning, at noon, and at bedtime.   allopurinol  (ZYLOPRIM ) 100 MG tablet Take 100 mg by mouth daily.   aspirin  81 MG chewable tablet Chew 81 mg by mouth daily.   benazepril  (LOTENSIN ) 5 MG tablet Take 5 mg by mouth daily.   Calcium Carb-Cholecalciferol 500-400 MG-UNIT TABS Take 2 tablets by mouth daily.    Cholecalciferol (VITAMIN D ) 50 MCG (2000 UT) CAPS Take 2,000 Units by mouth daily.   furosemide  (LASIX ) 20 MG tablet Take 1 tablet (20 mg total) by mouth as directed. Take one 20mg  tablet every Mon, thu for fluid retention   gabapentin (NEURONTIN) 100 MG capsule Take 200 mg by mouth at bedtime.   levothyroxine  (SYNTHROID ) 100 MCG tablet Take 100 mcg by mouth once a week. On Wednesday   levothyroxine  (SYNTHROID ) 50 MCG tablet Take 50 mcg by mouth daily before breakfast. Sun,Mon,Tue, Thur,Fri,Sat   memantine  (NAMENDA ) 10 MG tablet Take 10 mg by mouth 2 (two) times daily.   potassium chloride  (KLOR-CON ) 20 MEQ packet Take 20 mEq by mouth 2 (two) times a week. Monday and Thursday   potassium chloride  (KLOR-CON  M) 10 MEQ tablet Take 1 tablet (10 mEq total) by mouth 2 (two) times daily. (Patient not taking: Reported on 06/25/2023)   No facility-administered encounter medications on file as of 06/25/2023.    Review of Systems  Constitutional:  Negative for appetite change, fatigue and fever.  HENT:  Positive for hearing loss. Negative for congestion and voice change.   Eyes:  Negative for visual disturbance.  Respiratory:  Positive for shortness of breath. Negative for cough and wheezing.        DOE  Cardiovascular:  Positive for leg swelling.  Gastrointestinal:  Negative for abdominal pain and constipation.  Genitourinary:  Negative for dysuria and urgency.  Musculoskeletal:  Positive for arthralgias, back pain and gait  problem.       Chronic, aches.   Skin:  Negative for color change.  Neurological:  Negative for speech difficulty, weakness and light-headedness.  Psychiatric/Behavioral:  Positive for confusion. Negative for behavioral problems and sleep disturbance. The patient is not nervous/anxious.     Immunization History  Administered Date(s) Administered   Fluad Quad(high Dose 65+) 01/01/2022   Influenza, High Dose Seasonal PF 12/11/2018, 01/01/2022, 12/10/2022   Influenza-Unspecified 03/24/2018, 12/21/2019, 12/26/2020   Moderna Covid-19 Vaccine  Bivalent Booster 48yrs & up 01/09/2022, 12/24/2022   Moderna SARS-COV2 Booster Vaccination 02/20/2021, 07/26/2021   Moderna Sars-Covid-2 Vaccination 03/12/2019, 04/09/2019,  04/21/2019, 01/17/2020, 08/07/2020   Pneumococcal Conjugate-13 09/09/2012, 10/28/2013   Pneumococcal Polysaccharide-23 04/02/2009, 04/16/2009   Tdap 07/15/2019   Zoster Recombinant(Shingrix) 04/16/2009, 06/20/2022   Zoster, Live 04/16/2009   Zoster, Unspecified 04/16/2009   Pertinent  Health Maintenance Due  Topic Date Due   DEXA SCAN  07/02/2023 (Originally 01/08/1992)   INFLUENZA VACCINE  10/09/2023      01/07/2022    9:04 AM 02/06/2022   10:48 AM 04/21/2022    3:50 PM 06/12/2022   10:12 AM 06/12/2022   12:58 PM  Fall Risk  Falls in the past year? 0 0 0 0 1  Was there an injury with Fall? 0 0 0 0 0  Fall Risk Category Calculator 0 0 0 0 2  Fall Risk Category (Retired) Low Low     (RETIRED) Patient Fall Risk Level Moderate fall risk Moderate fall risk     Patient at Risk for Falls Due to History of fall(s) History of fall(s) No Fall Risks No Fall Risks History of fall(s);Impaired balance/gait;Impaired mobility  Fall risk Follow up Falls evaluation completed Falls evaluation completed Falls evaluation completed Falls evaluation completed Falls evaluation completed;Education provided;Falls prevention discussed;Follow up appointment   Functional Status Survey:    Vitals:    06/25/23 1054  BP: (!) 122/56  Pulse: 80  SpO2: 95%  Weight: 162 lb 3.2 oz (73.6 kg)  Height: 5\' 4"  (1.626 m)   Body mass index is 27.84 kg/m. Physical Exam Vitals and nursing note reviewed.  Constitutional:      Appearance: Normal appearance.  HENT:     Head: Normocephalic and atraumatic.     Nose: Nose normal.     Mouth/Throat:     Mouth: Mucous membranes are moist.  Eyes:     Extraocular Movements: Extraocular movements intact.     Conjunctiva/sclera: Conjunctivae normal.     Pupils: Pupils are equal, round, and reactive to light.  Cardiovascular:     Rate and Rhythm: Normal rate and regular rhythm.     Heart sounds: No murmur heard.    Comments: Weak dorsalis pedis pulses R+L from previous examination.  Pulmonary:     Effort: Pulmonary effort is normal.     Breath sounds: Rales present.     Comments: Bibasilar rales.  Abdominal:     General: Bowel sounds are normal.     Palpations: Abdomen is soft.     Tenderness: There is no abdominal tenderness.  Musculoskeletal:        General: No tenderness.     Cervical back: Normal range of motion and neck supple.     Right lower leg: Edema present.     Left lower leg: Edema present.     Comments: trace edema BLE.   Skin:    General: Skin is warm and dry.  Neurological:     General: No focal deficit present.     Mental Status: She is alert. Mental status is at baseline.     Gait: Gait abnormal.     Comments: Ambulates with walker.   Psychiatric:        Mood and Affect: Mood normal.        Behavior: Behavior normal.     Labs reviewed: Recent Labs    02/12/23 0000 02/24/23 0000 04/22/23 0000  NA 141 140 137  K 3.9 4.1 4.0  CL 102 108 103  CO2 25* 23* 24*  BUN 39* 31* 21  CREATININE 1.8* 1.4* 1.3*  CALCIUM 9.2 8.8 8.8  Recent Labs    02/12/23 0000  AST 17  ALT 14  ALKPHOS 98  ALBUMIN 4.0   Recent Labs    11/25/22 0000 02/10/23 0000 04/22/23 0000  WBC 9.7 8.8 9.3  NEUTROABS  --  6,961.00 7,933.00   HGB 12.1 13.0 1.8*  HCT 36 39 36  PLT 267 205 186   Lab Results  Component Value Date   TSH 2.48 05/19/2023   No results found for: "HGBA1C" No results found for: "CHOL", "HDL", "LDLCALC", "LDLDIRECT", "TRIG", "CHOLHDL"  Significant Diagnostic Results in last 30 days:  No results found.  Assessment/Plan Hypothyroidism on Levothyroxine , TSH 2.48 05/19/23  Gout stable, on Allopurinol        CKD (chronic kidney disease) stage 3, GFR 30-59 ml/min (HCC) Bun/creat 21/1.3 04/22/23  HTN (hypertension)  blood pressure is controlled on Benazepril   Senile dementia (HCC) 07/15/19 CT head Mild generalized parenchymal atrophy and chronic small vessel ischemic disease, tolerated Memantine  well. 06/22/22 MMSE 17/30  Generalized osteoarthritis of multiple sites T5, T8, L3 compression fxs. Takes Tylenol  and Gabapentin for pain, off Tramadol , Gabapentin, DDD per CT head/cervical spine 08/02/20, Vit D 61 05/19/23  Peripheral neuropathy  Peripheral neuropathy, taking Gabapentin, Vit B12 Gait abnormality, uses walker to ambulate slowly.       Congestive heart failure (CHF) (HCC) CHF, 09/30/21 CXR mild congestive heart failure, CXR 10/25/21 showed CHF. Takes Furosemide  20mg  x2/wk, compensated clinically. Echo 06/08/08 EF 60%, DOE/cough on and off. BNP 24 02/17/23    Family/ staff Communication: plan of care reviewed with the patient and charge nurse.   Labs/tests ordered:  none

## 2023-06-25 NOTE — Assessment & Plan Note (Signed)
 on Levothyroxine, TSH 2.48 05/19/23

## 2023-06-25 NOTE — Assessment & Plan Note (Signed)
 T5, T8, L3 compression fxs. Takes Tylenol and Gabapentin for pain, off Tramadol, Gabapentin, DDD per CT head/cervical spine 08/02/20, Vit D 61 05/19/23

## 2023-06-25 NOTE — Assessment & Plan Note (Signed)
 blood pressure is controlled on Benazepril

## 2023-06-29 ENCOUNTER — Encounter: Payer: Self-pay | Admitting: Nurse Practitioner

## 2023-07-06 ENCOUNTER — Non-Acute Institutional Stay: Payer: Self-pay | Admitting: Nurse Practitioner

## 2023-07-06 ENCOUNTER — Encounter: Payer: Self-pay | Admitting: Nurse Practitioner

## 2023-07-06 DIAGNOSIS — Z0000 Encounter for general adult medical examination without abnormal findings: Secondary | ICD-10-CM | POA: Diagnosis not present

## 2023-07-06 NOTE — Progress Notes (Unsigned)
 Subjective:   [name] is a 89 y.o. female who presents for Medicare Annual (Subsequent) preventive examination.  Visit Complete: In person  Patient Medicare AWV questionnaire was completed by the patient on 07/06/2023; I have confirmed that all information answered by patient is correct and no changes since this date.  Cardiac Risk Factors include: advanced age (>44men, >77 women);dyslipidemia;hypertension     Objective:    Today's Vitals   07/06/23 1301  BP: (!) 143/68  Pulse: 88  Resp: 18  Temp: (!) 97.5 F (36.4 C)  SpO2: 95%  Weight: 162 lb 3.2 oz (73.6 kg)  Height: 5\' 4"  (1.626 m)  PainSc: 0-No pain   Body mass index is 27.84 kg/m.     07/06/2023    1:13 PM 02/13/2023    3:37 PM 01/16/2023    1:45 PM 11/20/2022   10:32 AM 11/18/2022    3:43 PM 10/27/2022    9:28 AM 06/12/2022   10:15 AM  Advanced Directives  Does Patient Have a Medical Advance Directive? Yes Yes Yes Yes Yes Yes Yes  Type of Estate agent of [place];Out of facility DNR (pink MOST or yellow form) Healthcare Power of [place];Out of facility DNR (pink MOST or yellow form) Healthcare Power of [place];Out of facility DNR (pink MOST or yellow form) Healthcare Power of [place];Out of facility DNR (pink MOST or yellow form) Healthcare Power of [place];Out of facility DNR (pink MOST or yellow form) Healthcare Power of [place];Out of facility DNR (pink MOST or yellow form) Healthcare Power of [place];Out of facility DNR (pink MOST or yellow form)  Does patient want to make changes to medical advance directive? No - Patient declined No - Patient declined No - Patient declined  No - Patient declined No - Patient declined No - Patient declined  Copy of Healthcare Power of Attorney in Chart? Yes - validated most recent copy scanned in chart (See row information) Yes - validated most recent copy scanned in chart (See row information) Yes - validated most recent copy scanned in chart (See row  information) Yes - validated most recent copy scanned in chart (See row information) Yes - validated most recent copy scanned in chart (See row information) Yes - validated most recent copy scanned in chart (See row information) Yes - validated most recent copy scanned in chart (See row information)  Pre-existing out of facility DNR order (yellow form or pink MOST form) Pink MOST form placed in chart (order not valid for inpatient use);Yellow form placed in chart (order not valid for inpatient use) Pink MOST form placed in chart (order not valid for inpatient use);Yellow form placed in chart (order not valid for inpatient use)     Pink MOST/Yellow Form most recent copy in chart - Physician notified to receive inpatient order    Current Medications (verified) Outpatient Encounter Medications as of 07/06/2023  Medication Sig   acetaminophen  (TYLENOL ) 325 MG tablet Take 650 mg by mouth in the morning, at noon, and at bedtime.   allopurinol  (ZYLOPRIM ) 100 MG tablet Take 100 mg by mouth daily.   aspirin  81 MG chewable tablet Chew 81 mg by mouth daily.   benazepril  (LOTENSIN ) 5 MG tablet Take 5 mg by mouth daily.   Calcium Carb-Cholecalciferol 500-400 MG-UNIT TABS Take 2 tablets by mouth daily.    Cholecalciferol (VITAMIN D ) 50 MCG (2000 UT) CAPS Take 2,000 Units by mouth daily.   furosemide  (LASIX ) 20 MG tablet Take 1 tablet (20 mg total) by mouth  as directed. Take one 20mg  tablet every Mon, thu for fluid retention   gabapentin (NEURONTIN) 100 MG capsule Take 200 mg by mouth at bedtime.   levothyroxine  (SYNTHROID ) 100 MCG tablet Take 100 mcg by mouth once a week. On Wednesday   levothyroxine  (SYNTHROID ) 50 MCG tablet Take 50 mcg by mouth daily before breakfast. Sun,Mon,Tue, Thur,Fri,Sat   memantine  (NAMENDA ) 10 MG tablet Take 10 mg by mouth 2 (two) times daily.   potassium chloride  (KLOR-CON  M) 10 MEQ tablet Take 1 tablet (10 mEq total) by mouth 2 (two) times daily. (Patient not taking: Reported on  07/06/2023)   potassium chloride  (KLOR-CON ) 20 MEQ packet Take 20 mEq by mouth 2 (two) times a week. Monday and Thursday   No facility-administered encounter medications on file as of 07/06/2023.    Allergies (verified) Actonel [risedronate sodium], Actonel [risedronate], Hct [hydrochlorothiazide], Hydrochlorothiazide, Lipitor [atorvastatin calcium], Lipitor [atorvastatin], Neomycin, Neomycin, Polysporin [bacitracin-polymyxin b], Polysporin [bacitracin-polymyxin b], Prolia  [denosumab ], Prolia  [denosumab ], Sulfa antibiotics, Sulfa antibiotics, Zocor [simvastatin], and Zocor [simvastatin]   History: Past Medical History:  Diagnosis Date   Cognitive changes    Dementia (HCC)    Depression    Gout    Hypertension    Hypothyroidism    Peripheral neuropathy    History reviewed. No pertinent surgical history. History reviewed. No pertinent family history. Social History   Socioeconomic History   Marital status: Widowed    Spouse name: Not on file   Number of children: Not on file   Years of education: Not on file   Highest education level: Not on file  Occupational History   Not on file  Tobacco Use   Smoking status: Unknown   Smokeless tobacco: Never  Vaping Use   Vaping status: Never Used  Substance and Sexual Activity   Alcohol use: Never   Drug use: Never   Sexual activity: Not on file  Other Topics Concern   Not on file  Social History Narrative   ** Merged History Encounter **       Social Drivers of Health   Financial Resource Strain: Not on file  Food Insecurity: Not on file  Transportation Needs: Not on file  Physical Activity: Not on file  Stress: Not on file  Social Connections: Not on file    Tobacco Counseling Counseling given: Not Answered   Clinical Intake:  Pre-visit preparation completed: Yes  Pain Score: 0-No pain     BMI - recorded: 27.83 Nutritional Status: BMI 25 -29 Overweight Nutritional Risks: None Diabetes: No  How often do you  need to have someone help you when you read instructions, pamphlets, or other written materials from your doctor or pharmacy?: 5 - Always What is the last grade level you completed in school?: college  Interpreter Needed?: No  Information entered by :: Lisaann Atha Daylene Evangelist NP   Activities of Daily Living    07/06/2023   12:51 PM  In your present state of health, do you have any difficulty performing the following activities:  Hearing? 1  Vision? 0  Difficulty concentrating or making decisions? 1  Walking or climbing stairs? 1  Dressing or bathing? 1  Doing errands, shopping? 1  Preparing Food and eating ? N  Using the Toilet? N  In the past six months, have you accidently leaked urine? Y  Do you have problems with loss of bowel control? N  Managing your Medications? Y  Managing your Finances? Y  Housekeeping or managing your Housekeeping? Fernande Howells  Patient Care Team: Tye Gall, MD as PCP - General (Internal Medicine) Shellie Rogoff X, NP as Nurse Practitioner (Internal Medicine) Marguerite Shiley, MD as Consulting Physician (Internal Medicine) Marguerite Shiley, MD (Internal Medicine)  Indicate any recent Medical Services you may have received from other than [facility] providers in the past year (date may be approximate).     Assessment:   This is a routine wellness examination for Damyah.  Hearing/Vision screen No results found.   Goals Addressed             This Visit's Progress    Maintain Mobility and Function       Evidence-based guidance:  Acknowledge and validate impact of pain, loss of strength and potential disfigurement (hand osteoarthritis) on mental health and daily life, such as social isolation, anxiety, depression, impaired sexual relationship and   injury from falls.  Anticipate referral to physical or occupational therapy for assessment, therapeutic exercise and recommendation for adaptive equipment or assistive devices; encourage participation.  Assess impact  on ability to perform activities of daily living, as well as engage in sports and leisure events or requirements of work or school.  Provide anticipatory guidance and reassurance about the benefit of exercise to maintain function; acknowledge and normalize fear that exercise may worsen symptoms.  Encourage regular exercise, at least 10 minutes at a time for 45 minutes per week; consider yoga, water exercise and proprioceptive exercises; encourage use of wearable activity tracker to increase motivation and adherence.  Encourage maintenance or resumption of daily activities, including employment, as pain allows and with minimal exposure to trauma.  Assist patient to advocate for adaptations to the work environment.  Consider level of pain and function, gender, age, lifestyle, patient preference, quality of life, readiness and ?ocapacity to benefit? when recommending patients for orthopaedic surgery consultation.  Explore strategies, such as changes to medication regimen or activity that enables patient to anticipate and manage flare-ups that increase deconditioning and disability.  Explore patient preferences; encourage exposure to a broader range of activities that have been avoided for fear of experiencing pain.  Identify barriers to participation in therapy or exercise, such as pain with activity, anticipated or imagined pain.  Monitor postoperative joint replacement or any preexisting joint replacement for ongoing pain and loss of function; provide social support and encouragement throughout recovery.   Notes:        Depression Screen    07/06/2023   12:52 PM 06/12/2022   10:13 AM  PHQ 2/9 Scores  PHQ - 2 Score 0 0    Fall Risk    06/12/2022   12:58 PM 06/12/2022   10:12 AM 04/21/2022    3:50 PM 02/06/2022   10:48 AM 01/07/2022    9:04 AM  Fall Risk   Falls in the past year? 1 0 0 0 0  Number falls in past yr: 1 0 0 0 0  Injury with Fall? 0 0 0 0 0  Risk for fall due to : History of  fall(s);Impaired balance/gait;Impaired mobility No Fall Risks No Fall Risks History of fall(s) History of fall(s)  Follow up Falls evaluation completed;Education provided;Falls prevention discussed;Follow up appointment Falls evaluation completed Falls evaluation completed Falls evaluation completed Falls evaluation completed    MEDICARE RISK AT HOME: Medicare Risk at Home Any stairs in or around the home?: Yes If so, are there any without handrails?: No Home free of loose throw rugs in walkways, pet beds, electrical cords, etc?: Yes Adequate lighting in your home  to reduce risk of falls?: Yes Life alert?: No Use of a cane, walker or w/c?: Yes Grab bars in the bathroom?: Yes Shower chair or bench in shower?: Yes Elevated toilet seat or a handicapped toilet?: Yes  TIMED UP AND GO:  Was the test performed?  No    Cognitive Function:        Immunizations Immunization History  Administered Date(s) Administered   Fluad Quad(high Dose 65+) 01/01/2022   Influenza, High Dose Seasonal PF 12/11/2018, 01/01/2022, 12/10/2022   Influenza-Unspecified 03/24/2018, 12/21/2019, 12/26/2020   Moderna Covid-19 Vaccine  Bivalent Booster 45yrs & up 01/09/2022, 12/24/2022   Moderna SARS-COV2 Booster Vaccination 02/20/2021, 07/26/2021   Moderna Sars-Covid-2 Vaccination 03/12/2019, 04/09/2019, 04/21/2019, 01/17/2020, 08/07/2020   Pneumococcal Conjugate-13 09/09/2012, 10/28/2013   Pneumococcal Polysaccharide-23 04/02/2009, 04/16/2009   Tdap 07/15/2019   Zoster Recombinant(Shingrix) 04/16/2009, 06/20/2022   Zoster, Live 04/16/2009   Zoster, Unspecified 04/16/2009    TDAP status: Up to date  Flu Vaccine status: Up to date  Pneumococcal vaccine status: Up to date  Covid-19 vaccine status: Completed vaccines  Qualifies for Shingles Vaccine? Yes   Zostavax completed Yes   Shingrix Completed?: Yes  Screening Tests Health Maintenance  Topic Date Due   COVID-19 Vaccine (10 - 2024-25 season)  01/07/2024 (Originally 02/18/2023)   INFLUENZA VACCINE  10/09/2023   Medicare Annual Wellness (AWV)  07/05/2024   DTaP/Tdap/Td (2 - Td or Tdap) 07/14/2029   Pneumonia Vaccine 30+ Years old  Completed   Zoster Vaccines- Shingrix  Completed   HPV VACCINES  Aged Out   Meningococcal B Vaccine  Aged Out   DEXA SCAN  Discontinued    Health Maintenance  There are no preventive care reminders to display for this patient.   Colorectal cancer screening: No longer required.   Mammogram status: No longer required due to aged out.  DEXA no longer ambulating w/o assistance, aged ot 24, declined DEXA  Lung Cancer Screening: (Low Dose CT Chest recommended if Age 1-80 years, 20 pack-year currently smoking OR have quit w/in 15years.) does not qualify.   Lung Cancer Screening Referral: NA  Additional Screening:  Hepatitis C Screening: does not qualify;   Vision Screening: Recommended annual ophthalmology exams for early detection of glaucoma and other disorders of the eye. Is the patient up to date with their annual eye exam?  No  Who is the provider or what is the name of the office in which the patient attends annual eye exams? HPOA/patient will provide if desires.  If pt is not established with a provider, would they like to be referred to a provider to establish care? No .   Dental Screening: Recommended annual dental exams for proper oral hygiene  Diabetic Foot Exam: NA  Community Resource Referral / Chronic Care Management: CRR required this visit?  No   CCM required this visit?  No     Plan:     I have personally reviewed and noted the following in the patient's chart:   Medical and social history Use of alcohol, tobacco or illicit drugs  Current medications and supplements including opioid prescriptions. Patient is not currently taking opioid prescriptions. Functional ability and status Nutritional status Physical activity Advanced directives List of other  physicians Hospitalizations, surgeries, and ER visits in previous 12 months Vitals Screenings to include cognitive, depression, and falls Referrals and appointments  In addition, I have reviewed and discussed with patient certain preventive protocols, quality metrics, and best practice recommendations. A written personalized care plan for preventive services  as well as general preventive health recommendations were provided to patient.     Jakevion Arney X Sutton Hirsch, NP   07/07/2023   After Visit Summary: (In Person-Declined) Patient declined AVS at this time.

## 2023-07-07 ENCOUNTER — Encounter: Payer: Self-pay | Admitting: Nurse Practitioner

## 2023-07-20 ENCOUNTER — Non-Acute Institutional Stay (SKILLED_NURSING_FACILITY): Payer: Self-pay | Admitting: Nurse Practitioner

## 2023-07-20 ENCOUNTER — Encounter: Payer: Self-pay | Admitting: Nurse Practitioner

## 2023-07-20 DIAGNOSIS — M109 Gout, unspecified: Secondary | ICD-10-CM

## 2023-07-20 DIAGNOSIS — F0390 Unspecified dementia without behavioral disturbance: Secondary | ICD-10-CM

## 2023-07-20 DIAGNOSIS — N1831 Chronic kidney disease, stage 3a: Secondary | ICD-10-CM | POA: Diagnosis not present

## 2023-07-20 DIAGNOSIS — I10 Essential (primary) hypertension: Secondary | ICD-10-CM

## 2023-07-20 DIAGNOSIS — M159 Polyosteoarthritis, unspecified: Secondary | ICD-10-CM

## 2023-07-20 NOTE — Assessment & Plan Note (Signed)
07/15/19 CT head Mild generalized parenchymal atrophy and chronic small vessel ischemic disease, tolerated Memantine well. 06/22/22 MMSE 17/30

## 2023-07-20 NOTE — Assessment & Plan Note (Signed)
 09/30/21 CXR mild congestive heart failure, CXR 10/25/21 showed CHF. Takes Furosemide  20mg  x2/wk, compensated clinically. Echo 06/08/08 EF 60%, DOE/cough on and off. BNP 24 02/17/23

## 2023-07-20 NOTE — Assessment & Plan Note (Signed)
stable, on Allopurinol       

## 2023-07-20 NOTE — Progress Notes (Unsigned)
 Location:   [facility]  N[facility]ing Home Room Number: 31-A Place of Service:  SNF (31) Provider:  Arnesha Schiraldi, NP  PCP: Tye Gall, MD  Patient Care Team: Tye Gall, MD as PCP - General (Internal Medicine) Dariyah Garduno X, NP as Nurse Practitioner (Internal Medicine) Marguerite Shiley, MD as Consulting Physician (Internal Medicine) Marguerite Shiley, MD (Internal Medicine)  Extended Emergency Contact Information Primary Emergency Contact: Fojtik,[name]  [place] Home Phone: [phone] Relation: Son Secondary Emergency Contact: Jupin,[name] Mobile Phone: [phone] Relation: Son  Code Status:  DNR Goals of care: Advanced Directive information    07/20/2023   11:57 AM  Advanced Directives  Does Patient Have a Medical Advance Directive? Yes  Type of Estate agent of [place];Out of facility DNR (pink MOST or yellow form)  Does patient want to make changes to medical advance directive? No - Patient declined  Copy of Healthcare Power of Attorney in Chart? Yes - validated most recent copy scanned in chart (See row information)     Chief Complaint  Patient presents with   Medical Management of Chronic Issues    Routine visit.    HPI:  Pt is a 89 y.o. female seen today for medical management of chronic diseases.   CHF, 09/30/21 CXR mild congestive heart failure, CXR 10/25/21 showed CHF. Takes Furosemide  20mg  x2/wk, compensated clinically. Echo 06/08/08 EF 60%, DOE/cough on and off. BNP 24 02/17/23             Peripheral neuropathy, taking Gabapentin, Vit B12 Gait abnormality, uses walker to ambulate slowly.       Hx of TIA. Allergic to statin. On ASA.  HPOA desired no hospital evaluation. Seizure like activity, likely post fall, off Tramadol , not taking anti seizure meds.  OA: T5, T8, L3 compression fxs. Takes Tylenol  and Gabapentin for pain, off Tramadol , Gabapentin, DDD per CT head/cervical spine 08/02/20, Vit D 61 05/19/23              Hx of CVA per CT head 08/02/20 Remote infarcts within the a occipital cortices bilaterally again noted. Interval development of remote infarcts within the left parietal lobe and periventricular white matte. On ASA            Dementia, 07/15/19 CT head Mild generalized parenchymal atrophy and chronic small vessel ischemic disease, tolerated Memantine  well. 06/22/22 MMSE 17/30             HTN, loose blood pressure control,  on Benazepril              CKD Bun/creat 21/1.3 04/22/23             Gout, stable, on Allopurinol                    Hypothyroidism, on Levothyroxine , TSH 2.48 05/19/23             Abnormal CXR, suggested CT chest, not candidate for workup       Past Medical History:  Diagnosis Date   Cognitive changes    Dementia (HCC)    Depression    Gout    Hypertension    Hypothyroidism    Peripheral neuropathy    History reviewed. No pertinent surgical history.  Allergies  Allergen Reactions   Actonel [Risedronate Sodium]    Actonel [Risedronate] Other (See Comments)    "Allergic," per [facility]   Hct [Hydrochlorothiazide]    Hydrochlorothiazide Other (See Comments)    "Allergic," per [facility]  Lipitor [Atorvastatin Calcium]    Lipitor [Atorvastatin] Other (See Comments)    "Allergic," per [facility]   Neomycin Other (See Comments)    Unknown "been so long ago, a Dermatologist told me"   Neomycin Other (See Comments)    "Allergic," per [facility]   Polysporin [Bacitracin-Polymyxin B]    Polysporin [Bacitracin-Polymyxin B] Other (See Comments)    "Allergic," per [facility]   Prolia  [Denosumab ]    Prolia  [Denosumab ] Other (See Comments)   Sulfa Antibiotics    Sulfa Antibiotics Other (See Comments)    "Allergic," per [facility]   Zocor [Simvastatin]    Zocor [Simvastatin] Other (See Comments)    "Allergic," per [facility]    Allergies as of 07/20/2023       Reactions   Actonel [risedronate Sodium]    Actonel [risedronate] Other (See Comments)   "Allergic," per [facility]   Hct [hydrochlorothiazide]     Hydrochlorothiazide Other (See Comments)   "Allergic," per [facility]   Lipitor [atorvastatin Calcium]    Lipitor [atorvastatin] Other (See Comments)   "Allergic," per [facility]   Neomycin Other (See Comments)   Unknown "been so long ago, a Dermatologist told me"   Neomycin Other (See Comments)   "Allergic," per [facility]   Polysporin [bacitracin-polymyxin B]    Polysporin [bacitracin-polymyxin B] Other (See Comments)   "Allergic," per [facility]   Prolia  [denosumab ]    Prolia  [denosumab ] Other (See Comments)   Sulfa Antibiotics    Sulfa Antibiotics Other (See Comments)   "Allergic," per [facility]   Zocor [simvastatin]    Zocor [simvastatin] Other (See Comments)   "Allergic," per [facility]        Medication List        Accurate as of Jul 20, 2023 11:59 PM. If you have any questions, ask your nurse or doctor.          acetaminophen  325 MG tablet Commonly known as: TYLENOL  Take 650 mg by mouth in the morning, at noon, and at bedtime.   allopurinol  100 MG tablet Commonly known as: ZYLOPRIM  Take 100 mg by mouth daily.   aspirin  81 MG chewable tablet Chew 81 mg by mouth daily.   benazepril  5 MG tablet Commonly known as: LOTENSIN  Take 5 mg by mouth daily.   Calcium Carb-Cholecalciferol 500-400 MG-UNIT Tabs Take 2 tablets by mouth daily.   furosemide  20 MG tablet Commonly known as: LASIX  Take 20 mg by mouth 2 (two) times a week. Monday & Thursday.   furosemide  20 MG tablet Commonly known as: LASIX  Take 1 tablet (20 mg total) by mouth as directed. Take one 20mg  tablet every Mon, thu for fluid retention   gabapentin 100 MG capsule Commonly known as: NEURONTIN Take 200 mg by mouth at bedtime.   levothyroxine  100 MCG tablet Commonly known as: SYNTHROID  Take 100 mcg by mouth once a week. On Wednesday   levothyroxine  50 MCG tablet Commonly known as: SYNTHROID  Take 50 mcg by mouth daily before breakfast. Sun,Mon,Tue, Thur,Fri,Sat   memantine  10 MG tablet Commonly known as: NAMENDA  Take 5 mg by  mouth 2 (two) times daily.   potassium chloride  10 MEQ tablet Commonly known as: KLOR-CON  M Take 1 tablet (10 mEq total) by mouth 2 (two) times daily.   potassium chloride  20 MEQ packet Commonly known as: KLOR-CON  Take 20 mEq by mouth 2 (two) times a week. Monday and Thursday   Vitamin D  50 MCG (2000 UT) Caps Take 2,000 Units by mouth daily.        Review of Systems  Constitutional:  Negative for appetite change, fatigue and fever.  HENT:  Positive for hearing loss. Negative for congestion and voice change.   Eyes:  Negative for visual disturbance.  Respiratory:  Positive for shortness of breath. Negative for cough and wheezing.        DOE  Cardiovascular:  Positive for leg swelling.  Gastrointestinal:  Negative for abdominal pain and constipation.  Genitourinary:  Negative for dysuria and urgency.  Musculoskeletal:  Positive for arthralgias, back pain and gait problem.       Chronic, aches.   Skin:  Negative for color change.  Neurological:  Negative for speech difficulty, weakness and light-headedness.  Psychiatric/Behavioral:  Positive for confusion. Negative for behavioral problems and sleep disturbance. The patient is not nervous/anxious.     Immunization History  Administered Date(s) Administered   Fluad Quad(high Dose 65+) 01/01/2022   Influenza, High Dose Seasonal PF 12/11/2018, 01/01/2022, 12/10/2022   Influenza-Unspecified 03/24/2018, 12/21/2019, 12/26/2020   Moderna Covid-19 Vaccine  Bivalent Booster 27yrs & up 01/09/2022, 12/24/2022   Moderna SARS-COV2 Booster Vaccination 02/20/2021, 07/26/2021   Moderna Sars-Covid-2 Vaccination 03/12/2019, 04/09/2019, 04/21/2019, 01/17/2020, 08/07/2020   Pneumococcal Conjugate-13 09/09/2012, 10/28/2013   Pneumococcal Polysaccharide-23 04/02/2009, 04/16/2009   Tdap 07/15/2019   Zoster Recombinant(Shingrix) 04/16/2009, 06/20/2022   Zoster, Live 04/16/2009   Zoster, Unspecified 04/16/2009   Pertinent  Health Maintenance Due   Topic Date Due   INFLUENZA VACCINE  10/09/2023   DEXA SCAN  Discontinued      01/07/2022    9:04 AM 02/06/2022   10:48 AM 04/21/2022    3:50 PM 06/12/2022   10:12 AM 06/12/2022   12:58 PM  Fall Risk  Falls in the past year? 0 0 0 0 1  Was there an injury with Fall? 0 0 0 0 0  Fall Risk Category Calculator 0 0 0 0 2  Fall Risk Category (Retired) Low Low     (RETIRED) Patient Fall Risk Level Moderate fall risk Moderate fall risk     Patient at Risk for Falls Due to History of fall(s) History of fall(s) No Fall Risks No Fall Risks History of fall(s);Impaired balance/gait;Impaired mobility  Fall risk Follow up Falls evaluation completed Falls evaluation completed Falls evaluation completed Falls evaluation completed Falls evaluation completed;Education provided;Falls prevention discussed;Follow up appointment   Functional Status Survey:    Vitals:   07/20/23 1150 07/21/23 1648  BP: (!) 166/81 (!) 143/71  Pulse: 74   Resp: 18   Temp: (!) 97.4 F (36.3 C)   SpO2: 97%   Weight: 161 lb 9.6 oz (73.3 kg)   Height: 5\' 4"  (1.626 m)    Body mass index is 27.74 kg/m. Physical Exam Vitals and nursing note reviewed.  Constitutional:      Appearance: Normal appearance.  HENT:     Head: Normocephalic and atraumatic.     Nose: Nose normal.     Mouth/Throat:     Mouth: Mucous membranes are moist.  Eyes:     Extraocular Movements: Extraocular movements intact.     Conjunctiva/sclera: Conjunctivae normal.     Pupils: Pupils are equal, round, and reactive to light.  Cardiovascular:     Rate and Rhythm: Normal rate and regular rhythm.     Heart sounds: No murmur heard.    Comments: Weak dorsalis pedis pulses R+L from previous examination.  Pulmonary:     Effort: Pulmonary effort is normal.     Breath sounds: Rales present.     Comments: Bibasilar rales.  Abdominal:  General: Bowel sounds are normal.     Palpations: Abdomen is soft.     Tenderness: There is no abdominal tenderness.   Musculoskeletal:        General: No tenderness.     Cervical back: Normal range of motion and neck supple.     Right lower leg: Edema present.     Left lower leg: Edema present.     Comments: trace edema BLE.   Skin:    General: Skin is warm and dry.  Neurological:     General: No focal deficit present.     Mental Status: She is alert. Mental status is at baseline.     Gait: Gait abnormal.     Comments: Ambulates with walker.   Psychiatric:        Mood and Affect: Mood normal.        Behavior: Behavior normal.     Labs reviewed: Recent Labs    02/12/23 0000 02/24/23 0000 04/22/23 0000  NA 141 140 137  K 3.9 4.1 4.0  CL 102 108 103  CO2 25* 23* 24*  BUN 39* 31* 21  CREATININE 1.8* 1.4* 1.3*  CALCIUM 9.2 8.8 8.8   Recent Labs    02/12/23 0000  AST 17  ALT 14  ALKPHOS 98  ALBUMIN 4.0   Recent Labs    11/25/22 0000 02/10/23 0000 04/22/23 0000  WBC 9.7 8.8 9.3  NEUTROABS  --  6,961.00 7,933.00  HGB 12.1 13.0 1.8*  HCT 36 39 36  PLT 267 205 186   Lab Results  Component Value Date   TSH 2.48 05/19/2023   No results found for: "HGBA1C" No results found for: "CHOL", "HDL", "LDLCALC", "LDLDIRECT", "TRIG", "CHOLHDL"  Significant Diagnostic Results in last 30 days:  No results found.  Assessment/Plan HTN (hypertension) loose blood pressure control,  on Benazepril   CKD (chronic kidney disease) stage 3, GFR 30-59 ml/min (HCC) Bun/creat 21/1.3 04/22/23  Gout  stable, on Allopurinol       Hypothyroidism on Levothyroxine , TSH 2.48 05/19/23  Senile dementia (HCC) 07/15/19 CT head Mild generalized parenchymal atrophy and chronic small vessel ischemic disease, tolerated Memantine  well. 06/22/22 MMSE 17/30  Generalized osteoarthritis of multiple sites T5, T8, L3 compression fxs. Takes Tylenol  and Gabapentin for pain, off Tramadol , Gabapentin, DDD per CT head/cervical spine 08/02/20, Vit D 61 05/19/23  Congestive heart failure (CHF) (HCC) 09/30/21 CXR mild  congestive heart failure, CXR 10/25/21 showed CHF. Takes Furosemide  20mg  x2/wk, compensated clinically. Echo 06/08/08 EF 60%, DOE/cough on and off. BNP 24 02/17/23     Family/ staff Communication: plan of care reviewed with the patient and charge nurse.   Labs/tests ordered:  none

## 2023-07-20 NOTE — Assessment & Plan Note (Signed)
 Bun/creat 21/1.3 04/22/23

## 2023-07-20 NOTE — Assessment & Plan Note (Signed)
 loose blood pressure control,  on Benazepril 

## 2023-07-20 NOTE — Assessment & Plan Note (Signed)
 T5, T8, L3 compression fxs. Takes Tylenol and Gabapentin for pain, off Tramadol, Gabapentin, DDD per CT head/cervical spine 08/02/20, Vit D 61 05/19/23

## 2023-07-20 NOTE — Assessment & Plan Note (Signed)
 on Levothyroxine, TSH 2.48 05/19/23

## 2023-07-27 ENCOUNTER — Other Ambulatory Visit: Payer: Self-pay

## 2023-07-27 ENCOUNTER — Emergency Department (HOSPITAL_COMMUNITY)

## 2023-07-27 ENCOUNTER — Non-Acute Institutional Stay (SKILLED_NURSING_FACILITY): Payer: Self-pay | Admitting: Nurse Practitioner

## 2023-07-27 ENCOUNTER — Encounter: Payer: Self-pay | Admitting: Nurse Practitioner

## 2023-07-27 ENCOUNTER — Emergency Department (HOSPITAL_COMMUNITY)
Admission: EM | Admit: 2023-07-27 | Discharge: 2023-07-28 | Disposition: A | Discharge status: Skilled Nursing Facility | Attending: Emergency Medicine | Admitting: Emergency Medicine

## 2023-07-27 DIAGNOSIS — F0390 Unspecified dementia without behavioral disturbance: Secondary | ICD-10-CM | POA: Diagnosis not present

## 2023-07-27 DIAGNOSIS — R4589 Other symptoms and signs involving emotional state: Secondary | ICD-10-CM | POA: Insufficient documentation

## 2023-07-27 DIAGNOSIS — R2681 Unsteadiness on feet: Secondary | ICD-10-CM

## 2023-07-27 DIAGNOSIS — Z79890 Hormone replacement therapy (postmenopausal): Secondary | ICD-10-CM | POA: Insufficient documentation

## 2023-07-27 DIAGNOSIS — Z79899 Other long term (current) drug therapy: Secondary | ICD-10-CM | POA: Diagnosis not present

## 2023-07-27 DIAGNOSIS — R569 Unspecified convulsions: Secondary | ICD-10-CM | POA: Diagnosis present

## 2023-07-27 DIAGNOSIS — Z8673 Personal history of transient ischemic attack (TIA), and cerebral infarction without residual deficits: Secondary | ICD-10-CM

## 2023-07-27 DIAGNOSIS — G8384 Todd's paralysis (postepileptic): Secondary | ICD-10-CM | POA: Diagnosis not present

## 2023-07-27 DIAGNOSIS — I10 Essential (primary) hypertension: Secondary | ICD-10-CM | POA: Insufficient documentation

## 2023-07-27 DIAGNOSIS — E039 Hypothyroidism, unspecified: Secondary | ICD-10-CM | POA: Insufficient documentation

## 2023-07-27 DIAGNOSIS — I509 Heart failure, unspecified: Secondary | ICD-10-CM

## 2023-07-27 DIAGNOSIS — Z7982 Long term (current) use of aspirin: Secondary | ICD-10-CM | POA: Diagnosis not present

## 2023-07-27 DIAGNOSIS — R4182 Altered mental status, unspecified: Secondary | ICD-10-CM | POA: Diagnosis not present

## 2023-07-27 DIAGNOSIS — G459 Transient cerebral ischemic attack, unspecified: Secondary | ICD-10-CM

## 2023-07-27 DIAGNOSIS — G609 Hereditary and idiopathic neuropathy, unspecified: Secondary | ICD-10-CM

## 2023-07-27 DIAGNOSIS — R4689 Other symptoms and signs involving appearance and behavior: Secondary | ICD-10-CM

## 2023-07-27 DIAGNOSIS — F03918 Unspecified dementia, unspecified severity, with other behavioral disturbance: Secondary | ICD-10-CM | POA: Diagnosis not present

## 2023-07-27 LAB — COMPREHENSIVE METABOLIC PANEL WITH GFR
ALT: 11 U/L (ref 0–44)
AST: 17 U/L (ref 15–41)
Albumin: 3.2 g/dL — ABNORMAL LOW (ref 3.5–5.0)
Alkaline Phosphatase: 69 U/L (ref 38–126)
Anion gap: 10 (ref 5–15)
BUN: 18 mg/dL (ref 8–23)
CO2: 27 mmol/L (ref 22–32)
Calcium: 9.1 mg/dL (ref 8.9–10.3)
Chloride: 104 mmol/L (ref 98–111)
Creatinine, Ser: 1.48 mg/dL — ABNORMAL HIGH (ref 0.44–1.00)
GFR, Estimated: 32 mL/min — ABNORMAL LOW (ref >60)
Glucose, Bld: 96 mg/dL (ref 70–99)
Potassium: 3.6 mmol/L (ref 3.5–5.1)
Sodium: 141 mmol/L (ref 135–145)
Total Bilirubin: 0.5 mg/dL (ref 0.0–1.2)
Total Protein: 6.4 g/dL — ABNORMAL LOW (ref 6.5–8.1)

## 2023-07-27 LAB — CBC WITH DIFFERENTIAL/PLATELET
Abs Immature Granulocytes: 0.03 10*3/uL (ref 0.00–0.07)
Basophils Absolute: 0.1 10*3/uL (ref 0.0–0.1)
Basophils Relative: 1 %
Eosinophils Absolute: 0.2 10*3/uL (ref 0.0–0.5)
Eosinophils Relative: 2 %
HCT: 39.2 % (ref 36.0–46.0)
Hemoglobin: 12.3 g/dL (ref 12.0–15.0)
Immature Granulocytes: 0 %
Lymphocytes Relative: 10 %
Lymphs Abs: 1 10*3/uL (ref 0.7–4.0)
MCH: 32 pg (ref 26.0–34.0)
MCHC: 31.4 g/dL (ref 30.0–36.0)
MCV: 102.1 fL — ABNORMAL HIGH (ref 80.0–100.0)
Monocytes Absolute: 0.6 10*3/uL (ref 0.1–1.0)
Monocytes Relative: 7 %
Neutro Abs: 7.8 10*3/uL — ABNORMAL HIGH (ref 1.7–7.7)
Neutrophils Relative %: 80 %
Platelets: 202 10*3/uL (ref 150–400)
RBC: 3.84 MIL/uL — ABNORMAL LOW (ref 3.87–5.11)
RDW: 14.6 % (ref 11.5–15.5)
WBC: 9.8 10*3/uL (ref 4.0–10.5)
nRBC: 0 % (ref 0.0–0.2)

## 2023-07-27 LAB — URINALYSIS, W/ REFLEX TO CULTURE (INFECTION SUSPECTED)
Bilirubin Urine: NEGATIVE
Glucose, UA: NEGATIVE mg/dL
Hgb urine dipstick: NEGATIVE
Ketones, ur: NEGATIVE mg/dL
Leukocytes,Ua: NEGATIVE
Nitrite: NEGATIVE
Protein, ur: NEGATIVE mg/dL
Specific Gravity, Urine: 1.008 (ref 1.005–1.030)
pH: 6 (ref 5.0–8.0)

## 2023-07-27 LAB — TROPONIN I (HIGH SENSITIVITY)
Troponin I (High Sensitivity): 11 ng/L (ref <18)
Troponin I (High Sensitivity): 11 ng/L (ref <18)

## 2023-07-27 MED ORDER — GABAPENTIN 400 MG PO CAPS: 400.0000 mg | ORAL_CAPSULE | Freq: Every day | ORAL | 0 refills | Status: AC

## 2023-07-27 MED ORDER — LORAZEPAM 2 MG/ML IJ SOLN: 1.0000 mg | Freq: Once | INTRAMUSCULAR | Status: DC | PRN

## 2023-07-27 NOTE — ED Notes (Signed)
 Pt in MRI.

## 2023-07-27 NOTE — ED Notes (Signed)
 Pt thrashing out and yelling, attempting to get out of bed.  Pt's brief changed per request, albeit dry.

## 2023-07-27 NOTE — Progress Notes (Signed)
 Location:   S[facility] Room Number: 34 Place of Service:  SNF (31) Provider: Abner Hoffman Majesta Leichter NP  Tye Gall, MD  Patient Care Team: Tye Gall, MD as PCP - General (Internal Medicine) Dan Scearce X, NP as Nurse Practitioner (Internal Medicine) Marguerite Shiley, MD as Consulting Physician (Internal Medicine) Marguerite Shiley, MD (Internal Medicine)  Extended Emergency Contact Information Primary Emergency Contact: Leaman,[name]  [place] Home Phone: [phone] Relation: Son Secondary Emergency Contact: Levert,[name] Mobile Phone: [phone] Relation: Son  Code Status: DNR Goals of care: Advanced Directive information    07/20/2023   11:57 AM  Advanced Directives  Does Patient Have a Medical Advance Directive? Yes  Type of Estate agent of [place];Out of facility DNR (pink MOST or yellow form)  Does patient want to make changes to medical advance directive? No - Patient declined  Copy of Healthcare Power of Attorney in Chart? Yes - validated most recent copy scanned in chart (See row information)     Chief Complaint  Patient presents with   Acute Visit    Altered mental status, R sided weakness.     HPI:  Pt is a 89 y.o. female seen today for an acute visit for sudden onset of not follow the simple directions, R facial and limbs weakness. The patient is alert, no noted facial grimaces, nausea, vomiting, SOB, she is afebrile.    CHF, 09/30/21 CXR mild congestive heart failure, CXR 10/25/21 showed CHF. Takes Furosemide  20mg  x2/wk, compensated clinically. Echo  EF 60%, DOE/cough on and off. BNP 24 02/17/23             Peripheral neuropathy, taking Gabapentin , Vit B12 Gait abnormality, uses walker to ambulate slowly.       Hx of TIA. Allergic to statin. On ASA.  HPOA desired no hospital evaluation in the past.  Seizure like activity, likely post fall, off Tramadol , not taking anti seizure meds.  OA: T5, T8, L3  compression fxs. Takes Tylenol  and Gabapentin  for pain, off Tramadol , Gabapentin , DDD per CT head/cervical spine 08/02/20, Vit D 61 05/19/23             Hx of CVA per CT head 08/02/20 Remote infarcts within the a occipital cortices bilaterally again noted. Interval development of remote infarcts within the left parietal lobe and periventricular white matte. On ASA            Dementia, 07/15/19 CT head Mild generalized parenchymal atrophy and chronic small vessel ischemic disease, tolerated Memantine  well. 06/22/22 MMSE 17/30             HTN, loose blood pressure control,  on Benazepril              CKD Bun/creat 21/1.3 04/22/23             Gout, stable, on Allopurinol                    Hypothyroidism, on Levothyroxine , TSH 2.48 05/19/23             Abnormal CXR, suggested CT chest, not candidate for workup    Past Medical History:  Diagnosis Date   Cognitive changes    Dementia (HCC)    Depression    Gout    Hypertension    Hypothyroidism    Peripheral neuropathy    History reviewed. No pertinent surgical history.  Allergies  Allergen Reactions   Actonel [Risedronate Sodium]    Actonel [Risedronate] Other (See Comments)    "  Allergic," per [facility]   Hct [Hydrochlorothiazide]    Hydrochlorothiazide Other (See Comments)    "Allergic," per MAR   Lipitor [Atorvastatin Calcium]    Lipitor [Atorvastatin] Other (See Comments)    "Allergic," per MAR   Neomycin Other (See Comments)    Unknown "been so long ago, a Dermatologist told me"   Neomycin Other (See Comments)    "Allergic," per [facility]   Polysporin [Bacitracin-Polymyxin B]    Polysporin [Bacitracin-Polymyxin B] Other (See Comments)    "Allergic," per MAR   Prolia  [Denosumab ]    Prolia  [Denosumab ] Other (See Comments)   Sulfa Antibiotics    Sulfa Antibiotics Other (See Comments)    "Allergic," per MAR   Zocor [Simvastatin]    Zocor [Simvastatin] Other (See Comments)    "Allergic," per MAR    Allergies as of 07/27/2023       Reactions    Actonel [risedronate Sodium]    Actonel [risedronate] Other (See Comments)   "Allergic," per MAR   Hct [hydrochlorothiazide]    Hydrochlorothiazide Other (See Comments)   "Allergic," per MAR   Lipitor [atorvastatin Calcium]    Lipitor [atorvastatin] Other (See Comments)   "Allergic," per MAR   Neomycin Other (See Comments)   Unknown "been so long ago, a Dermatologist told me"   Neomycin Other (See Comments)   "Allergic," per MAR   Polysporin [bacitracin-polymyxin B]    Polysporin [bacitracin-polymyxin B] Other (See Comments)   "Allergic," per [facility]   Prolia  [denosumab ]    Prolia  [denosumab ] Other (See Comments)   Sulfa Antibiotics    Sulfa Antibiotics Other (See Comments)   "Allergic," per [facility]   Zocor [simvastatin]    Zocor [simvastatin] Other (See Comments)   "Allergic," per [facility]        Medication List        Accurate as of Jul 27, 2023 11:10 AM. If you have any questions, ask your nurse or doctor.          acetaminophen  325 MG tablet Commonly known as: TYLENOL  Take 650 mg by mouth in the morning, at noon, and at bedtime.   allopurinol  100 MG tablet Commonly known as: ZYLOPRIM  Take 100 mg by mouth daily.   aspirin  81 MG chewable tablet Chew 81 mg by mouth daily.   benazepril  5 MG tablet Commonly known as: LOTENSIN  Take 5 mg by mouth daily.   Calcium Carb-Cholecalciferol 500-400 MG-UNIT Tabs Take 2 tablets by mouth daily.   furosemide  20 MG tablet Commonly known as: LASIX  Take 20 mg by mouth 2 (two) times a week. Monday & Thursday.   furosemide  20 MG tablet Commonly known as: LASIX  Take 1 tablet (20 mg total) by mouth as directed. Take one 20mg  tablet every Mon, thu for fluid retention   gabapentin  100 MG capsule Commonly known as: NEURONTIN  Take 200 mg by mouth at bedtime.   levothyroxine  100 MCG tablet Commonly known as: SYNTHROID  Take 100 mcg by mouth once a week. On Wednesday   levothyroxine  50 MCG tablet Commonly known as: SYNTHROID  Take  50 mcg by mouth daily before breakfast. Sun,Mon,Tue, Thur,Fri,Sat   memantine  10 MG tablet Commonly known as: NAMENDA  Take 5 mg by mouth 2 (two) times daily.   potassium chloride  10 MEQ tablet Commonly known as: KLOR-CON  M Take 1 tablet (10 mEq total) by mouth 2 (two) times daily.   potassium chloride  20 MEQ packet Commonly known as: KLOR-CON  Take 20 mEq by mouth 2 (two) times a week. Monday and Thursday   Vitamin  D 50 MCG (2000 UT) Caps Take 2,000 Units by mouth daily.        Review of Systems  Constitutional:  Positive for activity change. Negative for diaphoresis and fever.  HENT:  Positive for hearing loss. Negative for congestion.   Eyes:  Negative for visual disturbance.  Respiratory:  Positive for shortness of breath. Negative for cough and wheezing.        DOE  Cardiovascular:  Negative for leg swelling.  Gastrointestinal:  Negative for abdominal pain, constipation, nausea and vomiting.  Genitourinary:  Negative for dysuria and urgency.  Musculoskeletal:  Positive for arthralgias, back pain and gait problem.       Chronic, aches.  Baseline walker with walker slowly with SBA  Skin:  Negative for color change.  Neurological:  Positive for facial asymmetry, speech difficulty and weakness.  Psychiatric/Behavioral:  Positive for confusion. Negative for sleep disturbance. The patient is not nervous/anxious.     Immunization History  Administered Date(s) Administered   Fluad Quad(high Dose 65+) 01/01/2022   Influenza, High Dose Seasonal PF 12/11/2018, 01/01/2022, 12/10/2022   Influenza-Unspecified 03/24/2018, 12/21/2019, 12/26/2020   Moderna Covid-19 Vaccine  Bivalent Booster 53yrs & up 01/09/2022, 12/24/2022   Moderna SARS-COV2 Booster Vaccination 02/20/2021, 07/26/2021   Moderna Sars-Covid-2 Vaccination 03/12/2019, 04/09/2019, 04/21/2019, 01/17/2020, 08/07/2020   Pneumococcal Conjugate-13 09/09/2012, 10/28/2013   Pneumococcal Polysaccharide-23 04/02/2009, 04/16/2009    Tdap 07/15/2019   Zoster Recombinant(Shingrix) 04/16/2009, 06/20/2022   Zoster, Live 04/16/2009   Zoster, Unspecified 04/16/2009   Pertinent  Health Maintenance Due  Topic Date Due   INFLUENZA VACCINE  10/09/2023   DEXA SCAN  Discontinued      01/07/2022    9:04 AM 02/06/2022   10:48 AM 04/21/2022    3:50 PM 06/12/2022   10:12 AM 06/12/2022   12:58 PM  Fall Risk  Falls in the past year? 0 0 0 0 1  Was there an injury with Fall? 0 0 0 0 0  Fall Risk Category Calculator 0 0 0 0 2  Fall Risk Category (Retired) Low Low     (RETIRED) Patient Fall Risk Level Moderate fall risk Moderate fall risk     Patient at Risk for Falls Due to History of fall(s) History of fall(s) No Fall Risks No Fall Risks History of fall(s);Impaired balance/gait;Impaired mobility  Fall risk Follow up Falls evaluation completed Falls evaluation completed Falls evaluation completed Falls evaluation completed Falls evaluation completed;Education provided;Falls prevention discussed;Follow up appointment   Functional Status Survey:    Vitals:   07/27/23 1054 07/27/23 1100  BP: (!) 144/62 (!) 148/72  Pulse: 84 86  Resp: 18   Temp: (!) 97.4 F (36.3 C)   SpO2: 93%   Weight: 161 lb 9.6 oz (73.3 kg)    Body mass index is 27.74 kg/m. Physical Exam Vitals and nursing note reviewed.  Constitutional:      Comments: Not following simple directions.   HENT:     Head: Normocephalic and atraumatic.     Nose: Nose normal.     Mouth/Throat:     Mouth: Mucous membranes are moist.  Eyes:     Extraocular Movements: Extraocular movements intact.     Conjunctiva/sclera: Conjunctivae normal.     Pupils: Pupils are equal, round, and reactive to light.  Cardiovascular:     Rate and Rhythm: Normal rate and regular rhythm.     Heart sounds: No murmur heard.    Comments: Weak dorsalis pedis pulses R+L from previous examination.  Pulmonary:  Effort: Pulmonary effort is normal.     Breath sounds: Rales present.      Comments: Bibasilar rales.  Abdominal:     General: Bowel sounds are normal.     Palpations: Abdomen is soft.     Tenderness: There is no abdominal tenderness.  Musculoskeletal:        General: No tenderness.     Cervical back: Normal range of motion and neck supple.     Right lower leg: No edema.     Left lower leg: No edema.  Skin:    General: Skin is warm and dry.  Neurological:     General: No focal deficit present.     Mental Status: She is alert. Mental status is at baseline.     Cranial Nerves: Cranial nerve deficit present.     Motor: Weakness present.     Gait: Gait abnormal.     Comments: Ambulates with walker is her baseline Right facial, RUE, RLE weakness     Labs reviewed: Recent Labs    02/12/23 0000 02/24/23 0000 04/22/23 0000  NA 141 140 137  K 3.9 4.1 4.0  CL 102 108 103  CO2 25* 23* 24*  BUN 39* 31* 21  CREATININE 1.8* 1.4* 1.3*  CALCIUM 9.2 8.8 8.8   Recent Labs    02/12/23 0000  AST 17  ALT 14  ALKPHOS 98  ALBUMIN 4.0   Recent Labs    11/25/22 0000 02/10/23 0000 04/22/23 0000  WBC 9.7 8.8 9.3  NEUTROABS  --  6,961.00 7,933.00  HGB 12.1 13.0 1.8*  HCT 36 39 36  PLT 267 205 186   Lab Results  Component Value Date   TSH 2.48 05/19/2023   No results found for: "HGBA1C" No results found for: "CHOL", "HDL", "LDLCALC", "LDLDIRECT", "TRIG", "CHOLHDL"  Significant Diagnostic Results in last 30 days:  No results found.  Assessment/Plan: Altered mental status  sudden onset of not follow the simple directions, R facial and limbs weakness. The patient is alert, no noted facial grimaces, nausea, vomiting, SOB, she is afebrile.  ED eval, HPOA consented .  History of CVA (cerebrovascular accident) Hx of CVA per CT head 08/02/20 Remote infarcts within the a occipital cortices bilaterally again noted. Interval development of remote infarcts within the left parietal lobe and periventricular white matte. On ASA  HTN (hypertension)  loose blood  pressure control,  on Benazepril   Dementia with behavioral disturbance (HCC) 07/15/19 CT head Mild generalized parenchymal atrophy and chronic small vessel ischemic disease, tolerated Memantine  well. 06/22/22 MMSE 17/30  Seizure-like activity (HCC) Seizure like activity, likely post fall, off Tramadol , not taking anti seizure meds.   TIA (transient ischemic attack) Allergic to statin. On ASA.  HPOA desired no hospital evaluation in the past.   Peripheral neuropathy Peripheral neuropathy, taking Gabapentin , Vit B12  Congestive heart failure (CHF) (HCC)  09/30/21 CXR mild congestive heart failure, CXR 10/25/21 showed CHF. Takes Furosemide  20mg  x2/wk, compensated clinically. Echo EF 60%, DOE/cough on and off. BNP 24 02/17/23    Family/ staff Communication: plan of care reviewed with the patient, the patient's HPOA-brother, and charge nurse.   Labs/tests ordered:  none

## 2023-07-27 NOTE — Assessment & Plan Note (Signed)
Seizure like activity, likely post fall, off Tramadol, not taking anti seizure meds.  

## 2023-07-27 NOTE — Assessment & Plan Note (Signed)
Hx of CVA per CT head 08/02/20 Remote infarcts within the a occipital cortices bilaterally again noted. Interval development of remote infarcts within the left parietal lobe and periventricular white matte. On ASA 

## 2023-07-27 NOTE — Assessment & Plan Note (Signed)
 loose blood pressure control,  on Benazepril 

## 2023-07-27 NOTE — ED Provider Notes (Signed)
 [facility] Provider Note   CSN: [id number] Arrival date & time: 07/27/23  1214     History  Chief Complaint  Patient presents with   Seizures   Transient Ischemic Attack         [name] is a 89 y.o. female.  HPI    89yo female with history of dementia, hypertension, hypothyroidism who presents with concern for episode of right sided weakness preceded by seizure like activity.   Looked like she had a seizure per med-tech, didn't say how long it lasted, called RN and they went down and when RN arrived she was no longer shaking ,  nistead had weakness to the right, not able to walk, not able to talk  Normally walking, talking  Was generally weak Was not able to hold up right arm or right leg Not able to grip Drooping right side of mouth Was coming back at around 10 minutes    Reported her body was shaking, blood from her mouth  Right sided weakness, numbness  1st cousin is her POA. 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 Authorizing Provider  acetaminophen  (TYLENOL ) 325 MG tablet Take 650 mg by mouth in the morning, at noon, and at bedtime.   Yes [provider]  allopurinol  (ZYLOPRIM ) 100 MG tablet Take 100 mg by mouth daily.   Yes [provider]  aspirin  81 MG chewable tablet Chew 81 mg by mouth daily.   Yes [provider]  benazepril  (LOTENSIN ) 5 MG tablet Take 5 mg by mouth daily.   Yes [provider]  Calcium Carb-Cholecalciferol 500-400 MG-UNIT TABS Take 2 tablets by mouth daily.    Yes [provider]  Cholecalciferol (VITAMIN D ) 50 MCG (2000 UT) CAPS  Take 2,000 Units by mouth daily.   Yes [provider]  furosemide  (LASIX ) 20 MG tablet Take 20 mg by mouth 2 (two) times a week. Monday & Thursday.   Yes [provider]  levothyroxine  (SYNTHROID ) 100 MCG tablet Take 100 mcg by mouth once a week. On Wednesday   Yes [provider]  levothyroxine  (SYNTHROID ) 50 MCG tablet Take 50 mcg by mouth daily before breakfast. Sun,Mon,Tue, Thur,Fri,Sat   Yes [provider]  memantine  (NAMENDA ) 10 MG tablet Take 5 mg by mouth 2 (two) times daily.   Yes [provider]  potassium chloride  (KLOR-CON ) 20 MEQ packet Take 20 mEq by mouth 2 (two) times a week. Monday and Thursday   Yes [provider]  gabapentin  (NEURONTIN ) 400 MG capsule Take 1 capsule (400 mg total) by mouth at bedtime. 07/27/23   Mordecai Applebaum, MD      Allergies    Actonel [risedronate sodium], Actonel [risedronate], Hct [hydrochlorothiazide], Hydrochlorothiazide, Lipitor [atorvastatin calcium], Lipitor [atorvastatin], Neomycin, Neomycin, Polysporin [bacitracin-polymyxin b], Polysporin [bacitracin-polymyxin b], Prolia  [denosumab ], Prolia  [denosumab ], Sulfa antibiotics, Sulfa antibiotics, Zocor [simvastatin], and Zocor [simvastatin]    Review of Systems   Review of Systems  Physical Exam Updated Vital Signs BP (!) 158/64   Pulse 88   Temp 98.4 F (36.9 C)  Resp 16   Ht 5\' 5"  (1.651 m)   Wt 73.3 kg   SpO2 98%   BMI 26.89 kg/m  Physical Exam  ED Results / Procedures / Treatments   Labs (all labs ordered are listed, but only abnormal results are displayed) Labs Reviewed  CBC WITH DIFFERENTIAL/PLATELET - Abnormal; Notable for the following components:      Result Value   RBC 3.84 (*)    MCV 102.1 (*)    Neutro Abs 7.8 (*)    All other components within normal limits  COMPREHENSIVE METABOLIC PANEL WITH GFR - Abnormal; Notable for the following components:   Creatinine, Ser 1.48 (*)    Total Protein 6.4 (*)    Albumin  3.2 (*)    GFR, Estimated 32 (*)    All other components within normal limits  URINALYSIS, W/ REFLEX TO CULTURE (INFECTION SUSPECTED) - Abnormal; Notable for the following components:   Color, Urine STRAW (*)    Bacteria, UA RARE (*)    All other components within normal limits  TROPONIN I (HIGH SENSITIVITY)  TROPONIN I (HIGH SENSITIVITY)    EKG EKG Interpretation Date/Time:  Monday Jul 27 2023 13:31:04 EDT Ventricular Rate:  80 PR Interval:  150 QRS Duration:  90 QT Interval:  390 QTC Calculation: 450 R Axis:   26  Text Interpretation: Sinus rhythm No significant change since last tracing Confirmed by Scarlette Currier ([id number]) on 07/27/2023 6:05:52 PM  Radiology MR BRAIN WO CONTRAST Result Date: 07/27/2023 CLINICAL DATA:  Follow-up examination for stroke. EXAM: MRI HEAD WITHOUT CONTRAST TECHNIQUE: Multiplanar, multiecho pulse sequences of the brain and surrounding structures were obtained without intravenous contrast. COMPARISON:  CT from earlier the same day. FINDINGS: Brain: Examination somewhat degraded by motion artifact. Cerebral volume within normal limits. Patchy T2/FLAIR hyperintensity involving the periventricular deep white matter both cerebral hemispheres, consistent with chronic small vessel ischemic disease, mild for age. Few scattered remote cortical infarcts noted about the occipital lobes bilaterally. Remote left parietal infarct noted. Mild chronic hemosiderin staining noted about these areas of chronic ischemia. No abnormal foci of restricted diffusion to suggest acute or subacute ischemia. Gray-white matter differentiation otherwise maintained. No acute intracranial hemorrhage. Single punctate chronic microhemorrhage noted within the right cerebellum. No mass lesion, midline shift or mass effect. Mild ventricular prominence related to underlying atrophy without hydrocephalus. No extra-axial fluid collection. Pituitary gland within normal limits. Vascular: Major intracranial  vascular flow voids are maintained. Skull and upper cervical spine: Craniocervical junction within normal limits. Bone marrow signal intensity normal. No scalp soft tissue abnormality. Sinuses/Orbits: Prior bilateral ocular lens replacement. Paranasal sinuses are largely clear. No significant mastoid effusion. Other: None. IMPRESSION: 1. No acute intracranial abnormality. 2. Mild chronic microvascular ischemic disease with a few scattered remote infarcts involving the left parietal and bilateral occipital lobes. Electronically Signed   By: Virgia Griffins M.D.   On: 07/27/2023 22:46   CT Head Wo Contrast Result Date: 07/27/2023 CLINICAL DATA:  Altered mental status, previous strokes EXAM: CT HEAD WITHOUT CONTRAST TECHNIQUE: Contiguous axial images were obtained from the base of the skull through the vertex without intravenous contrast. RADIATION DOSE REDUCTION: This exam was performed according to the departmental dose-optimization program which includes automated exposure control, adjustment of the mA and/or kV according to patient size and/or use of iterative reconstruction technique. COMPARISON:  11/24/2020 FINDINGS: Brain: Stable atrophy pattern and chronic white matter microvascular ischemic change throughout the periventricular white matter. Similar remote areas of infarct in the left parietal  lobe and both occipital lobes. No acute intracranial hemorrhage, new mass lesion, new infarction, midline shift, herniation, hydrocephalus, extra-axial fluid collection. Stable mild ventricular enlargement. Cisterns are patent. Mild cerebellar atrophy as well. Vascular: No hyperdense vessel or unexpected calcification. Skull: Normal. Negative for fracture or focal lesion. Sinuses/Orbits: No acute finding. Other: None. IMPRESSION: 1. Stable CT head. No acute intracranial abnormality by noncontrast CT. Electronically Signed   By: Melven Stable.  Shick M.D.   On: 07/27/2023 17:00    Procedures Procedures    Medications  Ordered in ED Medications  LORazepam  (ATIVAN ) injection 1 mg (has no administration in time range)    ED Course/ Medical Decision Making/ A&P Clinical Course as of 07/27/23 2333  Mon Jul 27, 2023  1811 Received sign out from Dr. Tamela Fake pending neurology evaluation. Pt with possible TIA, pending MRI also [WS]  2325 Received signout pending MRI, neurology consultation.  Neurology recommended MRI.  If this is negative, the recommended discharge with increasing gabapentin  to 400 mg at night.  Have sent updated prescription as MRI is negative for any acute process.  Will discharge patient back to her nursing facility. [WS]    Clinical Course User Index [WS] Mordecai Applebaum, MD                                  [phone] female with history of dementia, hypertension, hypothyroidism who presents with concern for episode of right sided weakness preceded by seizure like activity.   DDx includes TIA, seizure, electrolyte abnormality, other encephalopathy.    EKG without signs of arrhythmia.  Labs completed and evaluated by me without UTI, Cr near baseline, no anemia nor leukocytosis, troponin negative.    Clinically have concern for TIA versus seizure with todd's paralysis. Consulted Neurology. Signed out to Dr. Isaiah Marc.        Final Clinical Impression(s) / ED Diagnoses Final diagnoses:  Episode of abnormal behavior    Rx / DC Orders ED Discharge Orders          Ordered    gabapentin  (NEURONTIN ) 400 MG capsule  Daily at bedtime        07/27/23 2323    Ambulatory referral to Neurology       Comments: An appointment is requested in approximately: 2 weeks   07/27/23 2325              Scarlette Currier, MD 07/27/23 2333

## 2023-07-27 NOTE — Consult Note (Signed)
 NEUROLOGY CONSULT NOTE   Date of service: Jul 27, 2023 Patient Name: [name] MRN:  [id number] DOB:  Sep 27, 1924 Chief Complaint: "concern for seizure or TIA" Requesting Provider: Scarlette Currier, MD  History of Present Illness  [name] is a 89 y.o. female with hx of dementia residing in a SNF, hypertension, and prior concern for seizures versus TIAs, prior strokes with vascular dementia, gait instability, degenerative disc disease, CKD stage III, peripheral neuropathy, hypothyroidism, hearing loss  Per nurse in skilled care, she was called over by a med tech who felt like the patient was having a seizure and spitting up blood -- generalized shaking, no clear focality described. By the time nurse got to the patient the shaking and spitting was resolved, but she was generally weak. She had to be transferred to wheelchair, wasn't speaking. The NP assessed, and found the patient to have right arm and leg weakness, right facial droop, couldn't follow commands or converse   Of note I last saw the patient on 11/25/2020 at which time she had fallen, hit her head and subsequently had seizure-like activity with tongue bite for which neurology was consulted   Additional history from my prior note: "Per review of nursing home notes, as recently as 08/03/2020 she complained of dizziness when she is getting out of bed and has had some TIAs with, for example right leg weakness on 5/26 and on 09/21/2020 (found on the floor, not speaking or following for commands for about 10 minutes, then slurred speech for about 15 minutes with dizziness, resolving without intervention for which POA declined hospital evaluation and preferred PCP evaluation on 7/18; subsequent PCP note 7/19 "Her POA wanted to talk about referal to Neurology and TIA. He is not sure if he wants her to go anywhere as Mrs Herandez does not want anything Aggressive Will start her on Aspirin  325 mg QD for 3 weeks and then baby aspirin . No further  testing or Referal for now."   Regarding the course of her dementia, per PCP notes "MMSE dropped from 01/02/19 (29/30) to11/3/21 MMSE (18/30), failed clock drawing," favored to be vascular dementia"  Current relevant meds confirmed with facility: Gabapentin  200 mg at bedtime   Memantine  5 mg BID   No recent changes in her medications  LKW: unclear Modified rankin score: 4-Needs assistance to walk and tend to bodily needs IV Thrombolysis: No, symptoms resolved EVT: No, symptoms resolved  NIHSS components Score: Comment  1a Level of Conscious 0[x]  1[]  2[]  3[]      1b LOC Questions 0[]  1[]  2[x]       1c LOC Commands 0[x]  1[]  2[]       2 Best Gaze 0[x]  1[]  2[]       3 Visual 0[x]  1[]  2[]  3[]      4 Facial Palsy 0[x]  1[]  2[]  3[]      5a Motor Arm - left 0[x]  1[]  2[]  3[]  4[]  UN[]    5b Motor Arm - Right 0[x]  1[]  2[]  3[]  4[]  UN[]    6a Motor Leg - Left 0[x]  1[]  2[]  3[]  4[]  UN[]    6b Motor Leg - Right 0[x]  1[]  2[]  3[]  4[]  UN[]    7 Limb Ataxia 0[x]  1[]  2[]  UN[]      8 Sensory 0[x]  1[]  2[]  UN[]      9 Best Language 0[x]  1[]  2[]  3[]      10 Dysarthria 0[x]  1[]  2[]  UN[]      11 Extinct. and Inattention 0[x]  1[]  2[]       TOTAL:2  ROS   Unable to ascertain due to altered mental status  Past History   Past Medical History:  Diagnosis Date   Cognitive changes    Dementia (HCC)    Depression    Gout    Hypertension    Hypothyroidism    Peripheral neuropathy     No past surgical history on file.  Family History: No family history on file.  Social History  reports that she has an unknown smoking status. She has never used smokeless tobacco. She reports that she does not drink alcohol and does not use drugs.  Allergies  Allergen Reactions   Actonel [Risedronate Sodium]    Actonel [Risedronate] Other (See Comments)    "Allergic," per [facility]   Hct [Hydrochlorothiazide]    Hydrochlorothiazide Other (See Comments)    "Allergic," per [facility]   Lipitor [Atorvastatin Calcium]    Lipitor  [Atorvastatin] Other (See Comments)    "Allergic," per [facility]   Neomycin Other (See Comments)    Unknown "been so long ago, a Dermatologist told me"   Neomycin Other (See Comments)    "Allergic," per [facility]   Polysporin [Bacitracin-Polymyxin B]    Polysporin [Bacitracin-Polymyxin B] Other (See Comments)    "Allergic," per [facility]   Prolia  [Denosumab ]    Prolia  [Denosumab ] Other (See Comments)   Sulfa Antibiotics    Sulfa Antibiotics Other (See Comments)    "Allergic," per [facility]   Zocor [Simvastatin]    Zocor [Simvastatin] Other (See Comments)    "Allergic," per [facility]    Medications  No current facility-administered medications for this encounter.  Current Outpatient Medications:    acetaminophen  (TYLENOL ) 325 MG tablet, Take 650 mg by mouth in the morning, at noon, and at bedtime., Disp: , Rfl:    allopurinol  (ZYLOPRIM ) 100 MG tablet, Take 100 mg by mouth daily., Disp: , Rfl:    aspirin  81 MG chewable tablet, Chew 81 mg by mouth daily., Disp: , Rfl:    benazepril  (LOTENSIN ) 5 MG tablet, Take 5 mg by mouth daily., Disp: , Rfl:    Calcium Carb-Cholecalciferol 500-400 MG-UNIT TABS, Take 2 tablets by mouth daily. , Disp: , Rfl:    Cholecalciferol (VITAMIN D ) 50 MCG (2000 UT) CAPS, Take 2,000 Units by mouth daily. (Patient not taking: Reported on 07/20/2023), Disp: , Rfl:    furosemide  (LASIX ) 20 MG tablet, Take 1 tablet (20 mg total) by mouth as directed. Take one 20mg  tablet every Mon, thu for fluid retention (Patient not taking: Reported on 07/20/2023), Disp: , Rfl:    furosemide  (LASIX ) 20 MG tablet, Take 20 mg by mouth 2 (two) times a week. Monday & Thursday., Disp: , Rfl:    gabapentin  (NEURONTIN ) 100 MG capsule, Take 200 mg by mouth at bedtime., Disp: , Rfl:    levothyroxine  (SYNTHROID ) 100 MCG tablet, Take 100 mcg by mouth once a week. On Wednesday, Disp: , Rfl:    levothyroxine  (SYNTHROID ) 50 MCG tablet, Take 50 mcg by mouth daily before breakfast. Sun,Mon,Tue, Thur,Fri,Sat, Disp: , Rfl:     memantine  (NAMENDA ) 10 MG tablet, Take 5 mg by mouth 2 (two) times daily., Disp: , Rfl:    potassium chloride  (KLOR-CON  M) 10 MEQ tablet, Take 1 tablet (10 mEq total) by mouth 2 (two) times daily. (Patient not taking: Reported on 06/25/2023), Disp: , Rfl:    potassium chloride  (KLOR-CON ) 20 MEQ packet, Take 20 mEq by mouth 2 (two) times a week. Monday and Thursday, Disp: , Rfl:   Vitals   Vitals:  08-19-2023 1245 2023-08-19 1649 August 19, 2023 1650 19-Aug-2023 1700  BP: (!) 151/77 (!) 129/102  (!) 175/85  Pulse: 74 81  76  Resp: 16 17  16   Temp:   98.2 F (36.8 C)   TempSrc:      SpO2: 95% 100%  99%  Weight:      Height:        Body mass index is 26.89 kg/m.  Physical Exam   Constitutional: Well-developed, well-nourished elderly patient in no acute distress Psych: Affect appropriate to situation.  Eyes: No scleral injection.  HENT: No OP obstruction. Very hard of hearing.  Noted tongue bite on the right side Head: Normocephalic.  Respiratory: Effort normal, non-labored breathing.  Skin: WDI.   Neurologic Examination    NEURO:  Mental Status: Alert and oriented to person and place, disoriented to time and situation, unable to recall events of earlier today.   Speech/Language: speech is without dysarthria or aphasia in casual speech.  Formal language testing limited by hearing impairment as well as perseveration on needing to urinate  Cranial Nerves:  II: PERRL. Visual fields full to counting fingers.  III, IV, VI: EOMI. Eyelids elevate symmetrically.  V: Sensation is intact to light touch and symmetrical to face.  VII: Smile is symmetrical. Able to puff cheeks and raise eyebrows.  VIII: hearing is hard of hearing at baseline but able to respond to voice IX, X: Phonation is normal.  WU:[id number] shrug 5/5. XII: tongue is midline without fasciculations. Motor: Able to move all 4 extremities with no antigravity strength, no drift Tone: is normal and bulk is normal Sensation- Intact to  light touch bilaterally.  Coordination: FTN intact bilaterally Gait- deferred   Labs/Imaging/Neurodiagnostic studies   CBC:  Recent Labs  Lab 2023/08/19 1401  WBC 9.8  NEUTROABS 7.8*  HGB 12.3  HCT 39.2  MCV 102.1*  PLT 202   Basic Metabolic Panel:  Lab Results  Component Value Date   NA 141 2023-08-19   K 3.6 2023-08-19   CO2 27 08-19-23   GLUCOSE 96 August 19, 2023   BUN 18 08-19-23   CREATININE 1.48 (H) 08-19-23   CALCIUM 9.1 08-19-23   GFRNONAA 32 (L) 08/19/23   GFRAA 47 06/22/2020   Lipid Panel: No results found for: "LDLCALC" HgbA1c: No results found for: "HGBA1C" Urine Drug Screen: No results found for: "LABOPIA", "COCAINSCRNUR", "LABBENZ", "AMPHETMU", "THCU", "LABBARB"  Alcohol Level No results found for: "ETH" INR  Lab Results  Component Value Date   INR 1.0 11/24/2020   APTT No results found for: "APTT" AED levels: No results found for: "PHENYTOIN", "ZONISAMIDE", "LAMOTRIGINE", "LEVETIRACETA"  CT Head without contrast(Personally reviewed): No acute abnormality  MRI Brain(Personally reviewed): Pending  ASSESSMENT   [name] is a 89 y.o. female with history of dementia, CHF, peripheral neuropathy, TIA, stroke, hypertension, CKD, gout and hypothyroidism who presents after an episode of generalized shaking and spitting up blood followed by right-sided weakness and facial droop at her skilled nursing facility.  EMS was called, and patient was then brought to emergency department.  She does have a history of stroke as well as a possible previous seizure episode.  Her exam is nonfocal, and a bite is noted to the right side of her tongue.  Given the risk factors of previous stroke and dementia as well as the episode of shaking and tongue bite, this episode is most likely a seizure with postictal Todd's paralysis (chronic area of cortical encephalomalacia in the left parietal lobe could be a  potential seizure focus).  Given the patient is already taking  gabapentin , would increase this medication to prevent future seizures.  Will obtain MRI brain to determine if patient has had a new stroke or other abnormality, but would like to avoid admission if possible given patient's age, dementia and goals of care as discussed with her cousin, who is her power of attorney.  RECOMMENDATIONS  - Gabapentin  200 mg at bedtime, increase to 400 mg at bedtime (max total daily dose for her renal function is 600 mg/day in 1-2 divided doses, given creatinine clearance of ~22) - Inpatient EEG would not change management at this time, okay to repeat EEG on an outpatient basis - MRI brain, if negative for acute intracranial process, outpatient neurology follow-up - Discussed with ED provider via phone and secure chat ______________________________________________________________________  Patient seen by NP with MD, MD to edit note as needed.  Cortney E Bucky Cardinal , MSN, AGACNP-BC [facility] See Amion for schedule and pager information 07/27/2023 6:41 PM  Attending Neurologist's note:  I personally saw this patient, gathering history, performing a full neurologic examination, reviewing relevant labs, personally reviewing relevant imaging including head CT, prior MRI brain, and formulated the assessment and plan, adding the note above for completeness and clarity to accurately reflect my thoughts  Baldwin Levee MD-PhD [facility] [phone]

## 2023-07-27 NOTE — Assessment & Plan Note (Signed)
 sudden onset of not follow the simple directions, R facial and limbs weakness. The patient is alert, no noted facial grimaces, nausea, vomiting, SOB, she is afebrile.  ED eval, HPOA consented .

## 2023-07-27 NOTE — Assessment & Plan Note (Signed)
 Peripheral neuropathy, taking Gabapentin , Vit B12

## 2023-07-27 NOTE — Assessment & Plan Note (Signed)
07/15/19 CT head Mild generalized parenchymal atrophy and chronic small vessel ischemic disease, tolerated Memantine well. 06/22/22 MMSE 17/30

## 2023-07-27 NOTE — ED Notes (Signed)
 Received report from previous RN. Pt a little anxious, requesting to be changed. Brief changed and repositioned in bed for comfort. Disoriented to time and situation. Pt moving all extremities equally.  Bed in low lying/locked position.

## 2023-07-27 NOTE — Discharge Instructions (Addendum)
 We evaluated [name] for her episode of confusion.  Her MRI did not show any stroke.  She was seen by neurology.  Neurology recommends that we increase her dose of gabapentin  to 400 mg at night.  She should follow-up with neurology as an outpatient.  If she has any further episodes, please discuss this with her power of attorney, and bring her back to the emergency department if this is within her goals of care.

## 2023-07-27 NOTE — Assessment & Plan Note (Signed)
 09/30/21 CXR mild congestive heart failure, CXR 10/25/21 showed CHF. Takes Furosemide  20mg  x2/wk, compensated clinically. Echo EF 60%, DOE/cough on and off. BNP 24 02/17/23

## 2023-07-27 NOTE — ED Notes (Signed)
 Pt back from MRI

## 2023-07-27 NOTE — ED Triage Notes (Addendum)
 Pt bib gcems from "[facility]" . Workers at [employer] witnessed patient having right sided paralysis and for approximately 10 minutes. Facility did mention seizure like activity, but not for certain if seizure took place.    EMS VS  192/89 HR84 SPO2 98% RA  CBG 103 RR 16   18G LAC   POA [name] [phone]      Hx: TIA

## 2023-07-27 NOTE — Assessment & Plan Note (Signed)
 Ambulates with walker with SBA is her baseline.

## 2023-07-27 NOTE — ED Provider Notes (Signed)
    ED Course / MDM   Clinical Course as of 07/27/23 2326  Mon Jul 27, 2023  1811 Received sign out from Dr. Tamela Fake pending neurology evaluation. Pt with possible TIA, pending MRI also [WS]  2325 Received signout pending MRI, neurology consultation.  Neurology recommended MRI.  If this is negative, the recommended discharge with increasing gabapentin  to 400 mg at night.  Have sent updated prescription as MRI is negative for any acute process.  Will discharge patient back to her nursing facility. [WS]    Clinical Course User Index [WS] Mordecai Applebaum, MD   Medical Decision Making Amount and/or Complexity of Data Reviewed Labs: ordered. Radiology: ordered.  Risk Prescription drug management.        Mordecai Applebaum, MD 07/27/23 2326

## 2023-07-27 NOTE — Assessment & Plan Note (Signed)
 Allergic to statin. On ASA.  HPOA desired no hospital evaluation in the past.

## 2023-07-28 ENCOUNTER — Non-Acute Institutional Stay (SKILLED_NURSING_FACILITY): Payer: Self-pay | Admitting: Nurse Practitioner

## 2023-07-28 ENCOUNTER — Encounter: Payer: Self-pay | Admitting: Nurse Practitioner

## 2023-07-28 DIAGNOSIS — R569 Unspecified convulsions: Secondary | ICD-10-CM

## 2023-07-28 DIAGNOSIS — M109 Gout, unspecified: Secondary | ICD-10-CM

## 2023-07-28 DIAGNOSIS — G609 Hereditary and idiopathic neuropathy, unspecified: Secondary | ICD-10-CM

## 2023-07-28 DIAGNOSIS — G459 Transient cerebral ischemic attack, unspecified: Secondary | ICD-10-CM | POA: Diagnosis not present

## 2023-07-28 DIAGNOSIS — F0390 Unspecified dementia without behavioral disturbance: Secondary | ICD-10-CM

## 2023-07-28 DIAGNOSIS — I509 Heart failure, unspecified: Secondary | ICD-10-CM

## 2023-07-28 DIAGNOSIS — I10 Essential (primary) hypertension: Secondary | ICD-10-CM

## 2023-07-28 DIAGNOSIS — N1831 Chronic kidney disease, stage 3a: Secondary | ICD-10-CM

## 2023-07-28 DIAGNOSIS — Z8673 Personal history of transient ischemic attack (TIA), and cerebral infarction without residual deficits: Secondary | ICD-10-CM

## 2023-07-28 NOTE — Assessment & Plan Note (Signed)
 09/30/21 CXR mild congestive heart failure, CXR 10/25/21 showed CHF. Takes Furosemide  20mg  x2/wk, compensated clinically. Echo EF 60%, DOE/cough on and off. BNP 24 02/17/23

## 2023-07-28 NOTE — Assessment & Plan Note (Signed)
 Allergic to statin. On ASA.  HPOA desired no hospital evaluation in the past. ED 07/27/23 CT head/MR brain no acute intracranial changes.

## 2023-07-28 NOTE — Assessment & Plan Note (Signed)
taking Gabapentin, Vit B12

## 2023-07-28 NOTE — Assessment & Plan Note (Signed)
 06/22/22 MMSE 17/30, SNF [facility] for supportive care

## 2023-07-28 NOTE — Assessment & Plan Note (Signed)
 on Levothyroxine, TSH 2.48 05/19/23

## 2023-07-28 NOTE — Assessment & Plan Note (Signed)
 loose blood pressure control,  on Benazepril 

## 2023-07-28 NOTE — Assessment & Plan Note (Signed)
stable, on Allopurinol       

## 2023-07-28 NOTE — Assessment & Plan Note (Signed)
 07/27/23. CT head/MR brain:   No acute intracranial abnormality. Mild chronic microvascular ischemic disease with a few scattered remote infarcts involving the left parietal and bilateral occipital lobes.

## 2023-07-28 NOTE — Progress Notes (Signed)
 Location:   S[facility] Room Number: 44 Place of Service:  SNF (31) Provider: Abner Hoffman Brittanni Cariker NP  Tye Gall, MD  Patient Care Team: Tye Gall, MD as PCP - General (Internal Medicine) Ares Cardozo X, NP as Nurse Practitioner (Internal Medicine) Marguerite Shiley, MD as Consulting Physician (Internal Medicine) Marguerite Shiley, MD (Internal Medicine)  Extended Emergency Contact Information Primary Emergency Contact: [facility] Phone: [phone] Relation: Relative Secondary Emergency Contact: Kuyper,[name]  [place] Home Phone: [phone] Relation: Son  Code Status: DNR Goals of care: Advanced Directive information    07/27/2023   12:21 PM  Advanced Directives  Does Patient Have a Medical Advance Directive? Yes  Type of Risk manager Complaint  Patient presents with   Acute Visit    F/u ED evaluation for seizure, TIA    HPI:  Pt is a 89 y.o. female seen today for an acute visit for f/u ED evaluation for seizure, TIA 07/27/23. CT head/MR brain:   No acute intracranial abnormality. Mild chronic microvascular ischemic disease with a few scattered remote infarcts involving the left parietal and bilateral occipital lobes.  Neurology consult: increase Gabapentin  to 400mg  every day, out patient f/u neurology-HPOA desire to delay it.    CHF, 09/30/21 CXR mild congestive heart failure, CXR 10/25/21 showed CHF. Takes Furosemide  20mg  x2/wk, compensated clinically. Echo  EF 60%, DOE/cough on and off. BNP 24 02/17/23             Peripheral neuropathy, taking Gabapentin , Vit B12 Gait abnormality, uses walker to ambulate slowly.       Hx of TIA. Allergic to statin. On ASA.  HPOA desired no hospital evaluation in the past. ED 07/27/23 CT head/MR brain no acute intracranial changes.  Seizure like activity, hx of, likely post fall, off Tramadol , not taking anti seizure meds, 07/27/23 ED neurology:  increase Gabapentin  400mg  every day, f/u neurology out patient-HPOA desires to delay it.  OA: T5, T8, L3 compression fxs. Takes Tylenol  and Gabapentin  for pain, off Tramadol , Gabapentin , DDD per CT head/cervical spine 08/02/20, Vit D 61 05/19/23             Hx of CVA per CT head 08/02/20 Remote infarcts within the a occipital cortices bilaterally again noted. Interval development of remote infarcts within the left parietal lobe and periventricular white matte. On ASA            Dementia, 07/15/19 CT head Mild generalized parenchymal atrophy and chronic small vessel ischemic disease, tolerated Memantine  well. 06/22/22 MMSE 17/30             HTN, loose blood pressure control,  on Benazepril              CKD Bun/creat 18/1.48 07/27/23             Gout, stable, on Allopurinol                    Hypothyroidism, on Levothyroxine , TSH 2.48 05/19/23             Abnormal CXR, suggested CT chest, not candidate for workup   Past Medical History:  Diagnosis Date   Cognitive changes    Dementia (HCC)    Depression    Gout    Hypertension    Hypothyroidism    Peripheral neuropathy    History reviewed. No pertinent surgical history.  Allergies  Allergen Reactions   Actonel [Risedronate Sodium]  Actonel [Risedronate] Other (See Comments)    "Allergic," per [facility]   Hct [Hydrochlorothiazide]    Hydrochlorothiazide Other (See Comments)    "Allergic," per [facility]   Lipitor [Atorvastatin Calcium]    Lipitor [Atorvastatin] Other (See Comments)    "Allergic," per [facility]   Neomycin Other (See Comments)    Unknown "been so long ago, a Dermatologist told me"   Neomycin Other (See Comments)    "Allergic," per [facility]   Polysporin [Bacitracin-Polymyxin B]    Polysporin [Bacitracin-Polymyxin B] Other (See Comments)    "Allergic," per [facility]   Prolia  [Denosumab ]    Prolia  [Denosumab ] Other (See Comments)   Sulfa Antibiotics    Sulfa Antibiotics Other (See Comments)    "Allergic," per [facility]   Zocor [Simvastatin]    Zocor  [Simvastatin] Other (See Comments)    "Allergic," per [facility]    Allergies as of 07/28/2023       Reactions   Actonel [risedronate Sodium]    Actonel [risedronate] Other (See Comments)   "Allergic," per [facility]   Hct [hydrochlorothiazide]    Hydrochlorothiazide Other (See Comments)   "Allergic," per [facility]   Lipitor [atorvastatin Calcium]    Lipitor [atorvastatin] Other (See Comments)   "Allergic," per [facility]   Neomycin Other (See Comments)   Unknown "been so long ago, a Dermatologist told me"   Neomycin Other (See Comments)   "Allergic," per [facility]   Polysporin [bacitracin-polymyxin B]    Polysporin [bacitracin-polymyxin B] Other (See Comments)   "Allergic," per [facility]   Prolia  [denosumab ]    Prolia  [denosumab ] Other (See Comments)   Sulfa Antibiotics    Sulfa Antibiotics Other (See Comments)   "Allergic," per [facility]   Zocor [simvastatin]    Zocor [simvastatin] Other (See Comments)   "Allergic," per [facility]        Medication List        Accurate as of Jul 28, 2023 11:59 PM. If you have any questions, ask your nurse or doctor.          acetaminophen  325 MG tablet Commonly known as: TYLENOL  Take 650 mg by mouth in the morning, at noon, and at bedtime.   allopurinol  100 MG tablet Commonly known as: ZYLOPRIM  Take 100 mg by mouth daily.   aspirin  81 MG chewable tablet Chew 81 mg by mouth daily.   benazepril  5 MG tablet Commonly known as: LOTENSIN  Take 5 mg by mouth daily.   Calcium Carb-Cholecalciferol 500-400 MG-UNIT Tabs Take 2 tablets by mouth daily.   furosemide  20 MG tablet Commonly known as: LASIX  Take 20 mg by mouth 2 (two) times a week. Monday & Thursday.   gabapentin  400 MG capsule Commonly known as: NEURONTIN  Take 1 capsule (400 mg total) by mouth at bedtime.   levothyroxine  100 MCG tablet Commonly known as: SYNTHROID  Take 100 mcg by mouth once a week. On Wednesday   levothyroxine  50 MCG tablet Commonly known as: SYNTHROID  Take 50 mcg by mouth daily before  breakfast. Sun,Mon,Tue, Thur,Fri,Sat   memantine  10 MG tablet Commonly known as: NAMENDA  Take 5 mg by mouth 2 (two) times daily.   potassium chloride  20 MEQ packet Commonly known as: KLOR-CON  Take 20 mEq by mouth 2 (two) times a week. Monday and Thursday   Vitamin D  50 MCG (2000 UT) Caps Take 2,000 Units by mouth daily.        Review of Systems  Constitutional:  Positive for activity change. Negative for diaphoresis and fever.  HENT:  Positive for hearing loss. Negative for  congestion.   Eyes:  Negative for visual disturbance.  Respiratory:  Positive for shortness of breath. Negative for cough and wheezing.        DOE  Cardiovascular:  Negative for leg swelling.  Gastrointestinal:  Negative for abdominal pain and constipation.  Genitourinary:  Negative for dysuria and urgency.  Musculoskeletal:  Positive for arthralgias, back pain and gait problem.       Chronic, aches.  Baseline walker with walker slowly with SBA  Skin:  Negative for color change.  Neurological:  Negative for facial asymmetry, speech difficulty and weakness.  Psychiatric/Behavioral:  Positive for confusion. Negative for sleep disturbance. The patient is not nervous/anxious.     Immunization History  Administered Date(s) Administered   Fluad Quad(high Dose 65+) 01/01/2022   Influenza, High Dose Seasonal PF 12/11/2018, 01/01/2022, 12/10/2022   Influenza-Unspecified 03/24/2018, 12/21/2019, 12/26/2020   Moderna Covid-19 Vaccine  Bivalent Booster 65yrs & up 01/09/2022, 12/24/2022   Moderna SARS-COV2 Booster Vaccination 02/20/2021, 07/26/2021   Moderna Sars-Covid-2 Vaccination 03/12/2019, 04/09/2019, 04/21/2019, 01/17/2020, 08/07/2020   Pneumococcal Conjugate-13 09/09/2012, 10/28/2013   Pneumococcal Polysaccharide-23 04/02/2009, 04/16/2009   Tdap 07/15/2019   Zoster Recombinant(Shingrix) 04/16/2009, 06/20/2022   Zoster, Live 04/16/2009   Zoster, Unspecified 04/16/2009   Pertinent  Health Maintenance Due   Topic Date Due   INFLUENZA VACCINE  10/09/2023   DEXA SCAN  Discontinued      01/07/2022    9:04 AM 02/06/2022   10:48 AM 04/21/2022    3:50 PM 06/12/2022   10:12 AM 06/12/2022   12:58 PM  Fall Risk  Falls in the past year? 0 0 0 0 1  Was there an injury with Fall? 0 0 0 0 0  Fall Risk Category Calculator 0 0 0 0 2  Fall Risk Category (Retired) Low Low     (RETIRED) Patient Fall Risk Level Moderate fall risk Moderate fall risk     Patient at Risk for Falls Due to History of fall(s) History of fall(s) No Fall Risks No Fall Risks History of fall(s);Impaired balance/gait;Impaired mobility  Fall risk Follow up Falls evaluation completed Falls evaluation completed Falls evaluation completed Falls evaluation completed Falls evaluation completed;Education provided;Falls prevention discussed;Follow up appointment   Functional Status Survey:    Vitals:   07/28/23 1435  BP: 132/67  Pulse: 86  Resp: 18  Temp: (!) 97.4 F (36.3 C)  SpO2: 96%  Weight: 161 lb 9.6 oz (73.3 kg)   Body mass index is 26.89 kg/m. Physical Exam Vitals and nursing note reviewed.  Constitutional:      Appearance: Normal appearance.  HENT:     Head: Normocephalic and atraumatic.     Nose: Nose normal.     Mouth/Throat:     Mouth: Mucous membranes are moist.  Eyes:     Extraocular Movements: Extraocular movements intact.     Conjunctiva/sclera: Conjunctivae normal.     Pupils: Pupils are equal, round, and reactive to light.  Cardiovascular:     Rate and Rhythm: Normal rate and regular rhythm.     Heart sounds: No murmur heard.    Comments: Weak dorsalis pedis pulses R+L from previous examination.  Pulmonary:     Effort: Pulmonary effort is normal.     Breath sounds: Rales present.     Comments: Bibasilar rales.  Abdominal:     General: Bowel sounds are normal.     Palpations: Abdomen is soft.     Tenderness: There is no abdominal tenderness.  Musculoskeletal:  General: No tenderness.      Cervical back: Normal range of motion and neck supple.     Right lower leg: No edema.     Left lower leg: No edema.  Skin:    General: Skin is warm and dry.  Neurological:     General: No focal deficit present.     Mental Status: She is alert. Mental status is at baseline.     Cranial Nerves: No cranial nerve deficit.     Motor: No weakness.     Coordination: Coordination normal.     Gait: Gait abnormal.     Comments: Ambulates with walker is her baseline Right facial, RUE, RLE weakness-resolved  Psychiatric:        Mood and Affect: Mood normal.        Behavior: Behavior normal.     Labs reviewed: Recent Labs    02/24/23 0000 04/22/23 0000 07/27/23 1401  NA 140 137 141  K 4.1 4.0 3.6  CL 108 103 104  CO2 23* 24* 27  GLUCOSE  --   --  96  BUN 31* 21 18  CREATININE 1.4* 1.3* 1.48*  CALCIUM 8.8 8.8 9.1   Recent Labs    02/12/23 0000 07/27/23 1401  AST 17 17  ALT 14 11  ALKPHOS 98 69  BILITOT  --  0.5  PROT  --  6.4*  ALBUMIN 4.0 3.2*   Recent Labs    02/10/23 0000 04/22/23 0000 07/27/23 1401  WBC 8.8 9.3 9.8  NEUTROABS 6,961.00 7,933.00 7.8*  HGB 13.0 1.8* 12.3  HCT 39 36 39.2  MCV  --   --  102.1*  PLT 205 186 202   Lab Results  Component Value Date   TSH 2.48 05/19/2023   No results found for: "HGBA1C" No results found for: "CHOL", "HDL", "LDLCALC", "LDLDIRECT", "TRIG", "CHOLHDL"  Significant Diagnostic Results in last 30 days:  MR BRAIN WO CONTRAST Result Date: 07/27/2023 CLINICAL DATA:  Follow-up examination for stroke. EXAM: MRI HEAD WITHOUT CONTRAST TECHNIQUE: Multiplanar, multiecho pulse sequences of the brain and surrounding structures were obtained without intravenous contrast. COMPARISON:  CT from earlier the same day. FINDINGS: Brain: Examination somewhat degraded by motion artifact. Cerebral volume within normal limits. Patchy T2/FLAIR hyperintensity involving the periventricular deep white matter both cerebral hemispheres, consistent with  chronic small vessel ischemic disease, mild for age. Few scattered remote cortical infarcts noted about the occipital lobes bilaterally. Remote left parietal infarct noted. Mild chronic hemosiderin staining noted about these areas of chronic ischemia. No abnormal foci of restricted diffusion to suggest acute or subacute ischemia. Gray-white matter differentiation otherwise maintained. No acute intracranial hemorrhage. Single punctate chronic microhemorrhage noted within the right cerebellum. No mass lesion, midline shift or mass effect. Mild ventricular prominence related to underlying atrophy without hydrocephalus. No extra-axial fluid collection. Pituitary gland within normal limits. Vascular: Major intracranial vascular flow voids are maintained. Skull and upper cervical spine: Craniocervical junction within normal limits. Bone marrow signal intensity normal. No scalp soft tissue abnormality. Sinuses/Orbits: Prior bilateral ocular lens replacement. Paranasal sinuses are largely clear. No significant mastoid effusion. Other: None. IMPRESSION: 1. No acute intracranial abnormality. 2. Mild chronic microvascular ischemic disease with a few scattered remote infarcts involving the left parietal and bilateral occipital lobes. Electronically Signed   By: Virgia Griffins M.D.   On: 07/27/2023 22:46   CT Head Wo Contrast Result Date: 07/27/2023 CLINICAL DATA:  Altered mental status, previous strokes EXAM: CT HEAD WITHOUT CONTRAST TECHNIQUE: Contiguous axial  images were obtained from the base of the skull through the vertex without intravenous contrast. RADIATION DOSE REDUCTION: This exam was performed according to the departmental dose-optimization program which includes automated exposure control, adjustment of the mA and/or kV according to patient size and/or use of iterative reconstruction technique. COMPARISON:  11/24/2020 FINDINGS: Brain: Stable atrophy pattern and chronic white matter microvascular ischemic  change throughout the periventricular white matter. Similar remote areas of infarct in the left parietal lobe and both occipital lobes. No acute intracranial hemorrhage, new mass lesion, new infarction, midline shift, herniation, hydrocephalus, extra-axial fluid collection. Stable mild ventricular enlargement. Cisterns are patent. Mild cerebellar atrophy as well. Vascular: No hyperdense vessel or unexpected calcification. Skull: Normal. Negative for fracture or focal lesion. Sinuses/Orbits: No acute finding. Other: None. IMPRESSION: 1. Stable CT head. No acute intracranial abnormality by noncontrast CT. Electronically Signed   By: Melven Stable.  Shick M.D.   On: 07/27/2023 17:00    Assessment/Plan: TIA (transient ischemic attack) Allergic to statin. On ASA.  HPOA desired no hospital evaluation in the past. ED 07/27/23 CT head/MR brain no acute intracranial changes.   Seizure-like activity (HCC) Seizure like activity, hx of, likely post fall, off Tramadol , not taking anti seizure meds, 07/27/23 ED neurology: increase Gabapentin  400mg  every day, f/u neurology out patient-HPOA desires to delay it.   Peripheral neuropathy  taking Gabapentin , Vit B12  History of CVA (cerebrovascular accident)   07/27/23. CT head/MR brain:   No acute intracranial abnormality. Mild chronic microvascular ischemic disease with a few scattered remote infarcts involving the left parietal and bilateral occipital lobes.  Congestive heart failure (CHF) (HCC)  09/30/21 CXR mild congestive heart failure, CXR 10/25/21 showed CHF. Takes Furosemide  20mg  x2/wk, compensated clinically. Echo  EF 60%, DOE/cough on and off. BNP 24 02/17/23  Senile dementia (HCC) 06/22/22 MMSE 17/30, SNF [facility] for supportive care  HTN (hypertension)  loose blood pressure control,  on Benazepril   CKD (chronic kidney disease) stage 3, GFR 30-59 ml/min (HCC) Bun/creat 18/1.48 07/27/23  Gout stable, on Allopurinol       Hypothyroidism on Levothyroxine , TSH 2.48  05/19/23    Family/ staff Communication: plan of care reviewed with the patient, the patient's HPOA, and charge nurse.   Labs/tests ordered:  none

## 2023-07-28 NOTE — Assessment & Plan Note (Signed)
 Bun/creat 18/1.48 07/27/23

## 2023-07-28 NOTE — Assessment & Plan Note (Signed)
 Seizure like activity, hx of, likely post fall, off Tramadol , not taking anti seizure meds, 07/27/23 ED neurology: increase Gabapentin  400mg  every day, f/u neurology out patient-HPOA desires to delay it.

## 2023-08-14 ENCOUNTER — Encounter: Payer: Self-pay | Admitting: Nurse Practitioner

## 2023-08-14 ENCOUNTER — Non-Acute Institutional Stay (SKILLED_NURSING_FACILITY): Payer: Self-pay | Admitting: Nurse Practitioner

## 2023-08-14 DIAGNOSIS — J209 Acute bronchitis, unspecified: Secondary | ICD-10-CM

## 2023-08-14 DIAGNOSIS — I509 Heart failure, unspecified: Secondary | ICD-10-CM

## 2023-08-14 DIAGNOSIS — R569 Unspecified convulsions: Secondary | ICD-10-CM

## 2023-08-14 DIAGNOSIS — G459 Transient cerebral ischemic attack, unspecified: Secondary | ICD-10-CM

## 2023-08-14 DIAGNOSIS — E039 Hypothyroidism, unspecified: Secondary | ICD-10-CM

## 2023-08-14 DIAGNOSIS — G609 Hereditary and idiopathic neuropathy, unspecified: Secondary | ICD-10-CM | POA: Diagnosis not present

## 2023-08-14 DIAGNOSIS — F0390 Unspecified dementia without behavioral disturbance: Secondary | ICD-10-CM

## 2023-08-14 DIAGNOSIS — I10 Essential (primary) hypertension: Secondary | ICD-10-CM

## 2023-08-14 NOTE — Assessment & Plan Note (Signed)
 on Levothyroxine, TSH 2.48 05/19/23

## 2023-08-14 NOTE — Progress Notes (Signed)
 Location:   [facility] Room Number: 46 Place of Service:  SNF (31) Provider: Abner Hoffman Floraine Buechler NP  Tye Gall, MD  Patient Care Team: Tye Gall, MD as PCP - General (Internal Medicine) Adamary Savary X, NP as Nurse Practitioner (Internal Medicine) Marguerite Shiley, MD as Consulting Physician (Internal Medicine) Marguerite Shiley, MD (Internal Medicine)  Extended Emergency Contact Information Primary Emergency Contact: [facility] Phone: [phone] Relation: Relative Secondary Emergency Contact: Lobosco,[name]  [place] Home Phone: [phone] Relation: Son  Code Status: DNR Goals of care: Advanced Directive information    07/27/2023   12:21 PM  Advanced Directives  Does Patient Have a Medical Advance Directive? Yes  Type of Risk manager Complaint  Patient presents with   Acute Visit    Congestive cough    HPI:  Pt is a 89 y.o. female seen today for an acute visit for congestive cough, not able to cough up phlegm, denied chest pain, she is afebrile, no O2 desaturation.   ED evaluation for seizure, TIA 07/27/23. CT head/MR brain:              No acute intracranial abnormality. Mild chronic microvascular ischemic disease with a few scattered remote infarcts involving the left parietal and bilateral occipital lobes.             Neurology consult: increase Gabapentin  to 400mg  every day, out patient f/u neurology-HPOA desire to delay it.               CHF, 09/30/21 CXR mild congestive heart failure, CXR 10/25/21 showed CHF. Takes Furosemide  20mg  x2/wk, compensated clinically. Echo  EF 60%, DOE/cough on and off. BNP 24 02/17/23             Peripheral neuropathy, taking Gabapentin , Vit B12 Gait abnormality, uses walker to ambulate slowly.       Hx of TIA. Allergic to statin. On ASA.  HPOA desired no hospital evaluation in the past. ED 07/27/23 CT head/MR brain no acute intracranial changes.   Seizure like activity, hx of, likely post fall, off Tramadol , not taking anti seizure meds, 07/27/23 ED neurology: increase Gabapentin  400mg  every day, f/u neurology out patient-HPOA desires to delay it.  OA: T5, T8, L3 compression fxs. Takes Tylenol  and Gabapentin  for pain, off Tramadol , Gabapentin , DDD per CT head/cervical spine 08/02/20, Vit D 61 05/19/23             Hx of CVA per CT head 08/02/20 Remote infarcts within the a occipital cortices bilaterally again noted. Interval development of remote infarcts within the left parietal lobe and periventricular white matte. On ASA            Dementia, 07/15/19 CT head Mild generalized parenchymal atrophy and chronic small vessel ischemic disease, tolerated Memantine  well. 06/22/22 MMSE 17/30             HTN, loose blood pressure control,  on Benazepril              CKD Bun/creat 18/1.48 07/27/23             Gout, stable, on Allopurinol                    Hypothyroidism, on Levothyroxine , TSH 2.48 05/19/23             Abnormal CXR, suggested CT chest, not candidate for workup     Past Medical History:  Diagnosis Date  Cognitive changes    Dementia (HCC)    Depression    Gout    Hypertension    Hypothyroidism    Peripheral neuropathy    History reviewed. No pertinent surgical history.  Allergies  Allergen Reactions   Actonel [Risedronate Sodium]    Actonel [Risedronate] Other (See Comments)    "Allergic," per [facility]   Hct [Hydrochlorothiazide]    Hydrochlorothiazide Other (See Comments)    "Allergic," per [facility]   Lipitor [Atorvastatin Calcium]    Lipitor [Atorvastatin] Other (See Comments)    "Allergic," per [facility]   Neomycin Other (See Comments)    Unknown "been so long ago, a Dermatologist told me"   Neomycin Other (See Comments)    "Allergic," per [facility]   Polysporin [Bacitracin-Polymyxin B]    Polysporin [Bacitracin-Polymyxin B] Other (See Comments)    "Allergic," per MAR   Prolia  [Denosumab ]    Prolia  [Denosumab ] Other (See Comments)    Sulfa Antibiotics    Sulfa Antibiotics Other (See Comments)    "Allergic," per [facility]   Zocor [Simvastatin]    Zocor [Simvastatin] Other (See Comments)    "Allergic," per [facility]    Allergies as of 08/14/2023       Reactions   Actonel [risedronate Sodium]    Actonel [risedronate] Other (See Comments)   "Allergic," per [facility]   Hct [hydrochlorothiazide]    Hydrochlorothiazide Other (See Comments)   "Allergic," per [facility]   Lipitor [atorvastatin Calcium]    Lipitor [atorvastatin] Other (See Comments)   "Allergic," per [facility]   Neomycin Other (See Comments)   Unknown "been so long ago, a Dermatologist told me"   Neomycin Other (See Comments)   "Allergic," per [facility]   Polysporin [bacitracin-polymyxin B]    Polysporin [bacitracin-polymyxin B] Other (See Comments)   "Allergic," per [facility]   Prolia  [denosumab ]    Prolia  [denosumab ] Other (See Comments)   Sulfa Antibiotics    Sulfa Antibiotics Other (See Comments)   "Allergic," per [facility]   Zocor [simvastatin]    Zocor [simvastatin] Other (See Comments)   "Allergic," per [facility]        Medication List        Accurate as of August 14, 2023 12:02 PM. If you have any questions, ask your nurse or doctor.          acetaminophen  325 MG tablet Commonly known as: TYLENOL  Take 650 mg by mouth in the morning, at noon, and at bedtime.   allopurinol  100 MG tablet Commonly known as: ZYLOPRIM  Take 100 mg by mouth daily.   aspirin  81 MG chewable tablet Chew 81 mg by mouth daily.   benazepril  5 MG tablet Commonly known as: LOTENSIN  Take 5 mg by mouth daily.   Calcium Carb-Cholecalciferol 500-400 MG-UNIT Tabs Take 2 tablets by mouth daily.   furosemide  20 MG tablet Commonly known as: LASIX  Take 20 mg by mouth 2 (two) times a week. Monday & Thursday.   gabapentin  400 MG capsule Commonly known as: NEURONTIN  Take 1 capsule (400 mg total) by mouth at bedtime.   levothyroxine  100 MCG tablet Commonly known as: SYNTHROID  Take 100 mcg by mouth once a  week. On Wednesday   levothyroxine  50 MCG tablet Commonly known as: SYNTHROID  Take 50 mcg by mouth daily before breakfast. Sun,Mon,Tue, Thur,Fri,Sat   memantine  10 MG tablet Commonly known as: NAMENDA  Take 5 mg by mouth 2 (two) times daily.   potassium chloride  20 MEQ packet Commonly known as: KLOR-CON  Take 20 mEq by mouth 2 (two) times a  week. Monday and Thursday   Vitamin D  50 MCG (2000 UT) Caps Take 2,000 Units by mouth daily.        Review of Systems  Constitutional:  Negative for activity change, diaphoresis and fever.  HENT:  Positive for hearing loss. Negative for congestion.   Eyes:  Negative for visual disturbance.  Respiratory:  Positive for cough. Negative for shortness of breath and wheezing.        DOE Congestive cough  Cardiovascular:  Negative for leg swelling.  Gastrointestinal:  Negative for abdominal pain and constipation.  Genitourinary:  Negative for dysuria and urgency.  Musculoskeletal:  Positive for arthralgias, back pain and gait problem.       Chronic, aches.  Baseline walker with walker slowly with SBA  Skin:  Negative for color change.  Neurological:  Negative for facial asymmetry, speech difficulty and weakness.  Psychiatric/Behavioral:  Positive for confusion. Negative for sleep disturbance. The patient is not nervous/anxious.     Immunization History  Administered Date(s) Administered   Fluad Quad(high Dose 65+) 01/01/2022   Influenza, High Dose Seasonal PF 12/11/2018, 01/01/2022, 12/10/2022   Influenza-Unspecified 03/24/2018, 12/21/2019, 12/26/2020   Moderna Covid-19 Vaccine  Bivalent Booster 33yrs & up 01/09/2022, 12/24/2022   Moderna SARS-COV2 Booster Vaccination 02/20/2021, 07/26/2021   Moderna Sars-Covid-2 Vaccination 03/12/2019, 04/09/2019, 04/21/2019, 01/17/2020, 08/07/2020   Pneumococcal Conjugate-13 09/09/2012, 10/28/2013   Pneumococcal Polysaccharide-23 04/02/2009, 04/16/2009   Tdap 07/15/2019   Zoster Recombinant(Shingrix)  04/16/2009, 06/20/2022   Zoster, Live 04/16/2009   Zoster, Unspecified 04/16/2009   Pertinent  Health Maintenance Due  Topic Date Due   INFLUENZA VACCINE  10/09/2023   DEXA SCAN  Discontinued      01/07/2022    9:04 AM 02/06/2022   10:48 AM 04/21/2022    3:50 PM 06/12/2022   10:12 AM 06/12/2022   12:58 PM  Fall Risk  Falls in the past year? 0 0 0 0 1  Was there an injury with Fall? 0 0 0 0 0  Fall Risk Category Calculator 0 0 0 0 2  Fall Risk Category (Retired) Low Low     (RETIRED) Patient Fall Risk Level Moderate fall risk Moderate fall risk     Patient at Risk for Falls Due to History of fall(s) History of fall(s) No Fall Risks No Fall Risks History of fall(s);Impaired balance/gait;Impaired mobility  Fall risk Follow up Falls evaluation completed Falls evaluation completed Falls evaluation completed Falls evaluation completed Falls evaluation completed;Education provided;Falls prevention discussed;Follow up appointment   Functional Status Survey:    Vitals:   08/14/23 1145  BP: 125/70  Pulse: 77  Resp: 18  Temp: (!) 96.8 F (36 C)  SpO2: 94%  Weight: 157 lb 14.4 oz (71.6 kg)   Body mass index is 26.28 kg/m. Physical Exam Vitals and nursing note reviewed.  Constitutional:      Appearance: Normal appearance.  HENT:     Head: Normocephalic and atraumatic.     Nose: Nose normal.     Mouth/Throat:     Mouth: Mucous membranes are moist.  Eyes:     Extraocular Movements: Extraocular movements intact.     Conjunctiva/sclera: Conjunctivae normal.     Pupils: Pupils are equal, round, and reactive to light.  Cardiovascular:     Rate and Rhythm: Normal rate and regular rhythm.     Heart sounds: No murmur heard.    Comments: Weak dorsalis pedis pulses R+L from previous examination.  Pulmonary:     Effort: Pulmonary effort is normal.  Breath sounds: Rales present.     Comments: Bibasilar rales.  Central congestion.  Abdominal:     General: Bowel sounds are normal.      Palpations: Abdomen is soft.     Tenderness: There is no abdominal tenderness.  Musculoskeletal:        General: No tenderness.     Cervical back: Normal range of motion and neck supple.     Right lower leg: No edema.     Left lower leg: No edema.  Skin:    General: Skin is warm and dry.  Neurological:     General: No focal deficit present.     Mental Status: She is alert. Mental status is at baseline.     Gait: Gait abnormal.     Comments: Ambulates with walker is her baseline Right facial, RUE, RLE weakness-resolved  Psychiatric:        Mood and Affect: Mood normal.        Behavior: Behavior normal.     Labs reviewed: Recent Labs    02/24/23 0000 04/22/23 0000 07/27/23 1401  NA 140 137 141  K 4.1 4.0 3.6  CL 108 103 104  CO2 23* 24* 27  GLUCOSE  --   --  96  BUN 31* 21 18  CREATININE 1.4* 1.3* 1.48*  CALCIUM 8.8 8.8 9.1   Recent Labs    02/12/23 0000 07/27/23 1401  AST 17 17  ALT 14 11  ALKPHOS 98 69  BILITOT  --  0.5  PROT  --  6.4*  ALBUMIN 4.0 3.2*   Recent Labs    02/10/23 0000 04/22/23 0000 07/27/23 1401  WBC 8.8 9.3 9.8  NEUTROABS 6,961.00 7,933.00 7.8*  HGB 13.0 1.8* 12.3  HCT 39 36 39.2  MCV  --   --  102.1*  PLT 205 186 202   Lab Results  Component Value Date   TSH 2.48 05/19/2023   No results found for: "HGBA1C" No results found for: "CHOL", "HDL", "LDLCALC", "LDLDIRECT", "TRIG", "CHOLHDL"  Significant Diagnostic Results in last 30 days:  MR BRAIN WO CONTRAST Result Date: 07/27/2023 CLINICAL DATA:  Follow-up examination for stroke. EXAM: MRI HEAD WITHOUT CONTRAST TECHNIQUE: Multiplanar, multiecho pulse sequences of the brain and surrounding structures were obtained without intravenous contrast. COMPARISON:  CT from earlier the same day. FINDINGS: Brain: Examination somewhat degraded by motion artifact. Cerebral volume within normal limits. Patchy T2/FLAIR hyperintensity involving the periventricular deep white matter both cerebral  hemispheres, consistent with chronic small vessel ischemic disease, mild for age. Few scattered remote cortical infarcts noted about the occipital lobes bilaterally. Remote left parietal infarct noted. Mild chronic hemosiderin staining noted about these areas of chronic ischemia. No abnormal foci of restricted diffusion to suggest acute or subacute ischemia. Gray-white matter differentiation otherwise maintained. No acute intracranial hemorrhage. Single punctate chronic microhemorrhage noted within the right cerebellum. No mass lesion, midline shift or mass effect. Mild ventricular prominence related to underlying atrophy without hydrocephalus. No extra-axial fluid collection. Pituitary gland within normal limits. Vascular: Major intracranial vascular flow voids are maintained. Skull and upper cervical spine: Craniocervical junction within normal limits. Bone marrow signal intensity normal. No scalp soft tissue abnormality. Sinuses/Orbits: Prior bilateral ocular lens replacement. Paranasal sinuses are largely clear. No significant mastoid effusion. Other: None. IMPRESSION: 1. No acute intracranial abnormality. 2. Mild chronic microvascular ischemic disease with a few scattered remote infarcts involving the left parietal and bilateral occipital lobes. Electronically Signed   By: Elenor Griffith.D.  On: 07/27/2023 22:46   CT Head Wo Contrast Result Date: 07/27/2023 CLINICAL DATA:  Altered mental status, previous strokes EXAM: CT HEAD WITHOUT CONTRAST TECHNIQUE: Contiguous axial images were obtained from the base of the skull through the vertex without intravenous contrast. RADIATION DOSE REDUCTION: This exam was performed according to the departmental dose-optimization program which includes automated exposure control, adjustment of the mA and/or kV according to patient size and/or use of iterative reconstruction technique. COMPARISON:  11/24/2020 FINDINGS: Brain: Stable atrophy pattern and chronic white  matter microvascular ischemic change throughout the periventricular white matter. Similar remote areas of infarct in the left parietal lobe and both occipital lobes. No acute intracranial hemorrhage, new mass lesion, new infarction, midline shift, herniation, hydrocephalus, extra-axial fluid collection. Stable mild ventricular enlargement. Cisterns are patent. Mild cerebellar atrophy as well. Vascular: No hyperdense vessel or unexpected calcification. Skull: Normal. Negative for fracture or focal lesion. Sinuses/Orbits: No acute finding. Other: None. IMPRESSION: 1. Stable CT head. No acute intracranial abnormality by noncontrast CT. Electronically Signed   By: Melven Stable.  Shick M.D.   On: 07/27/2023 17:00    Assessment/Plan: Acute bronchitis congestive cough, not able to cough up phlegm, denied chest pain, she is afebrile, no O2 desaturation.  CXR 08/13/23 no acute cardiopulmonary disease.  Zpk, Mucinex bid x 5 days Vs, Sat O2 q shift x72 hours   Congestive heart failure (CHF) (HCC)  09/30/21 CXR mild congestive heart failure, CXR 10/25/21 showed CHF. Takes Furosemide  20mg  x2/wk, compensated clinically. Echo  EF 60%, DOE/cough on and off. BNP 24 02/17/23  Peripheral neuropathy taking Gabapentin , Vit B12, ambulates with walker.   TIA (transient ischemic attack) Hx of TIA. Allergic to statin. On ASA.  HPOA desired no hospital evaluation in the past. ED 07/27/23 CT head/MR brain no acute intracranial changes.   Seizure-like activity (HCC) hx of, likely post fall, off Tramadol , not taking anti seizure meds, 07/27/23 ED neurology: increase Gabapentin  400mg  every day, f/u neurology out patient-HPOA desires to delay it.   Major neurocognitive disorder (HCC) tolerated Memantine  well. 06/22/22 MMSE 17/30  HTN (hypertension) oose blood pressure control,  on Benazepril     Family/ staff Communication: plan of care reviewed with the patient and charge nurse.   Labs/tests ordered:  CXR ap/lateral.

## 2023-08-14 NOTE — Assessment & Plan Note (Signed)
 congestive cough, not able to cough up phlegm, denied chest pain, she is afebrile, no O2 desaturation.  CXR 08/13/23 no acute cardiopulmonary disease.  Zpk, Mucinex bid x 5 days Vs, Sat O2 q shift x72 hours

## 2023-08-14 NOTE — Assessment & Plan Note (Signed)
 tolerated Memantine  well. 06/22/22 MMSE 17/30

## 2023-08-14 NOTE — Assessment & Plan Note (Signed)
 oose blood pressure control,  on Benazepril 

## 2023-08-14 NOTE — Assessment & Plan Note (Signed)
 09/30/21 CXR mild congestive heart failure, CXR 10/25/21 showed CHF. Takes Furosemide  20mg  x2/wk, compensated clinically. Echo EF 60%, DOE/cough on and off. BNP 24 02/17/23

## 2023-08-14 NOTE — Assessment & Plan Note (Signed)
 Hx of TIA. Allergic to statin. On ASA.  HPOA desired no hospital evaluation in the past. ED 07/27/23 CT head/MR brain no acute intracranial changes.

## 2023-08-14 NOTE — Assessment & Plan Note (Signed)
taking Gabapentin, Vit B12, ambulates with walker.

## 2023-08-14 NOTE — Assessment & Plan Note (Signed)
 hx of, likely post fall, off Tramadol , not taking anti seizure meds, 07/27/23 ED neurology: increase Gabapentin  400mg  every day, f/u neurology out patient-HPOA desires to delay it.

## 2023-09-03 ENCOUNTER — Non-Acute Institutional Stay (SKILLED_NURSING_FACILITY): Payer: Self-pay | Admitting: Sports Medicine

## 2023-09-03 DIAGNOSIS — I10 Essential (primary) hypertension: Secondary | ICD-10-CM

## 2023-09-03 DIAGNOSIS — F0390 Unspecified dementia without behavioral disturbance: Secondary | ICD-10-CM | POA: Diagnosis not present

## 2023-09-03 DIAGNOSIS — G629 Polyneuropathy, unspecified: Secondary | ICD-10-CM

## 2023-09-03 DIAGNOSIS — M109 Gout, unspecified: Secondary | ICD-10-CM | POA: Diagnosis not present

## 2023-09-03 DIAGNOSIS — E559 Vitamin D deficiency, unspecified: Secondary | ICD-10-CM

## 2023-09-03 DIAGNOSIS — E039 Hypothyroidism, unspecified: Secondary | ICD-10-CM

## 2023-09-03 NOTE — Progress Notes (Signed)
 Location:  [facility] Room Number: 31-A Place of Service:  SNF (31) Provider:  Sherlynn Madden, MD   Sherlynn Madden, MD  Patient Care Team: Sherlynn Madden, MD as PCP - General (Internal Medicine) Mast, Man X, NP as Nurse Practitioner (Internal Medicine) Charlanne Fredia CROME, MD as Consulting Physician (Internal Medicine) Charlanne Fredia CROME, MD (Internal Medicine)  Extended Emergency Contact Information Primary Emergency Contact: [facility] Phone: [phone] Relation: Relative Secondary Emergency Contact: Sessums,[name]  [place] Home Phone: [phone] Relation: Son  Code Status:  DNR Goals of care: Advanced Directive information    09/03/2023   11:49 AM  Advanced Directives  Does Patient Have a Medical Advance Directive? Yes  Type of Estate agent of [place];Out of facility DNR (pink MOST or yellow form)  Does patient want to make changes to medical advance directive? No - Patient declined  Copy of Healthcare Power of Attorney in Chart? Yes - validated most recent copy scanned in chart (See row information)  Pre-existing out of facility DNR order (yellow form or pink MOST form) Pink MOST form placed in chart (order not valid for inpatient use)     No chief complaint on file.   HPI:  Pt is a 89 y.o. female with PMH of dementia, chf, peripheral neuropathy, seizure like activity, OA , H/o CVA is seen today for medical management of chronic diseases.   Pt seen and examined in the living room. She is sitting in the geri chair watching TV. She seems pleasant and comfortable and does not appear to be in distress. She knows her name, not oriented to time or place. Cannot remember what she had for breakfast. Pt denies chest pain, sob, abdominal pain, nausea,vomiting, dysuria, hematuria, bloody or dark stools.  She ambulates with a rolator walker , no behaivoral manifestations reported by the  nursing staff.   Past Medical History:  Diagnosis Date   Cognitive changes    Dementia (HCC)    Depression    Gout    Hypertension    Hypothyroidism    Peripheral neuropathy    No past surgical history on file.  Allergies  Allergen Reactions   Actonel [Risedronate Sodium]    Actonel [Risedronate] Other (See Comments)    Allergic, per [facility]   Hct [Hydrochlorothiazide]    Hydrochlorothiazide Other (See Comments)    Allergic, per [facility]   Lipitor [Atorvastatin Calcium]    Lipitor [Atorvastatin] Other (See Comments)    Allergic, per [facility]   Neomycin Other (See Comments)    Unknown been so long ago, a Dermatologist told me   Neomycin Other (See Comments)    Allergic, per [facility]   Polysporin [Bacitracin-Polymyxin B]    Polysporin [Bacitracin-Polymyxin B] Other (See Comments)    Allergic, per [facility]   Prolia  [Denosumab ]    Prolia  [Denosumab ] Other (See Comments)   Sulfa Antibiotics    Sulfa Antibiotics Other (See Comments)    Allergic, per [facility]   Zocor [Simvastatin]    Zocor [Simvastatin] Other (See Comments)    Allergic, per [facility]    Allergies as of 09/03/2023       Reactions   Actonel [risedronate Sodium]    Actonel [risedronate] Other (See Comments)   Allergic, per [facility]   Hct [hydrochlorothiazide]    Hydrochlorothiazide Other (See Comments)   Allergic, per [facility]   Lipitor [atorvastatin Calcium]    Lipitor [atorvastatin] Other (See Comments)   Allergic, per [facility]   Neomycin Other (See Comments)  Unknown been so long ago, a Dermatologist told me   Neomycin Other (See Comments)   Allergic, per [facility]   Polysporin [bacitracin-polymyxin B]    Polysporin [bacitracin-polymyxin B] Other (See Comments)   Allergic, per [facility]   Prolia  [denosumab ]    Prolia  [denosumab ] Other (See Comments)   Sulfa Antibiotics    Sulfa Antibiotics Other (See Comments)   Allergic, per [facility]   Zocor [simvastatin]    Zocor [simvastatin] Other (See Comments)   Allergic, per [facility]         Medication List        Accurate as of September 03, 2023 11:58 AM. If you have any questions, ask your nurse or doctor.          acetaminophen  325 MG tablet Commonly known as: TYLENOL  Take 650 mg by mouth in the morning, at noon, and at bedtime.   allopurinol  100 MG tablet Commonly known as: ZYLOPRIM  Take 100 mg by mouth daily.   aspirin  81 MG chewable tablet Chew 81 mg by mouth daily.   benazepril  5 MG tablet Commonly known as: LOTENSIN  Take 5 mg by mouth daily.   Calcium Carb-Cholecalciferol 500-400 MG-UNIT Tabs Take 2 tablets by mouth daily.   furosemide  20 MG tablet Commonly known as: LASIX  Take 20 mg by mouth 2 (two) times a week. Monday & Thursday.   gabapentin  400 MG capsule Commonly known as: NEURONTIN  Take 1 capsule (400 mg total) by mouth at bedtime.   levothyroxine  100 MCG tablet Commonly known as: SYNTHROID  Take 100 mcg by mouth once a week. On Wednesday   levothyroxine  50 MCG tablet Commonly known as: SYNTHROID  Take 50 mcg by mouth daily before breakfast. Sun,Mon,Tue, Thur,Fri,Sat   memantine  10 MG tablet Commonly known as: NAMENDA  Take 5 mg by mouth 2 (two) times daily.   potassium chloride  20 MEQ packet Commonly known as: KLOR-CON  Take 20 mEq by mouth 2 (two) times a week. Monday and Thursday   Vitamin D  50 MCG (2000 UT) Caps Take 2,000 Units by mouth daily.        Review of Systems  Immunization History  Administered Date(s) Administered   Fluad Quad(high Dose 65+) 01/01/2022   Influenza, High Dose Seasonal PF 12/11/2018, 01/01/2022, 12/10/2022   Influenza-Unspecified 03/24/2018, 12/21/2019, 12/26/2020   Moderna Covid-19 Vaccine  Bivalent Booster 78yrs & up 01/09/2022, 12/24/2022   Moderna SARS-COV2 Booster Vaccination 02/20/2021, 07/26/2021   Moderna Sars-Covid-2 Vaccination 03/12/2019, 04/09/2019, 04/21/2019, 01/17/2020, 08/07/2020   Pneumococcal Conjugate-13 09/09/2012, 10/28/2013   Pneumococcal Polysaccharide-23  04/02/2009, 04/16/2009   Tdap 07/15/2019   Zoster Recombinant(Shingrix) 04/16/2009, 06/20/2022   Zoster, Live 04/16/2009   Zoster, Unspecified 04/16/2009   Pertinent  Health Maintenance Due  Topic Date Due   INFLUENZA VACCINE  10/09/2023   DEXA SCAN  Discontinued      01/07/2022    9:04 AM 02/06/2022   10:48 AM 04/21/2022    3:50 PM 06/12/2022   10:12 AM 06/12/2022   12:58 PM  Fall Risk  Falls in the past year? 0 0 0 0 1  Was there an injury with Fall? 0 0 0 0 0  Fall Risk Category Calculator 0 0 0 0 2  Fall Risk Category (Retired) Low  Low      (RETIRED) Patient Fall Risk Level Moderate fall risk  Moderate fall risk      Patient at Risk for Falls Due to History of fall(s) History of fall(s) No Fall Risks No Fall Risks History of fall(s);Impaired balance/gait;Impaired mobility  Fall risk  Follow up Falls evaluation completed  Falls evaluation completed  Falls evaluation completed Falls evaluation completed Falls evaluation completed;Education provided;Falls prevention discussed;Follow up appointment     Data saved with a previous flowsheet row definition   Functional Status Survey:    Vitals:   09/03/23 1148  BP: (!) 122/54  Pulse: 67  Resp: 16  Temp: (!) 97.5 F (36.4 C)  SpO2: 97%  Weight: 157 lb 14.4 oz (71.6 kg)   Body mass index is 26.28 kg/m. Physical Exam  Labs reviewed: Recent Labs    02/24/23 0000 04/22/23 0000 07/27/23 1401  NA 140 137 141  K 4.1 4.0 3.6  CL 108 103 104  CO2 23* 24* 27  GLUCOSE  --   --  96  BUN 31* 21 18  CREATININE 1.4* 1.3* 1.48*  CALCIUM 8.8 8.8 9.1   Recent Labs    02/12/23 0000 07/27/23 1401  AST 17 17  ALT 14 11  ALKPHOS 98 69  BILITOT  --  0.5  PROT  --  6.4*  ALBUMIN 4.0 3.2*   Recent Labs    02/10/23 0000 04/22/23 0000 07/27/23 1401  WBC 8.8 9.3 9.8  NEUTROABS 6,961.00 7,933.00 7.8*  HGB 13.0 1.8* 12.3  HCT 39 36 39.2  MCV  --   --  102.1*  PLT 205 186 202   Lab Results  Component Value Date   TSH  2.48 05/19/2023   No results found for: HGBA1C No results found for: CHOL, HDL, LDLCALC, LDLDIRECT, TRIG, CHOLHDL  Significant Diagnostic Results in last 30 days:  No results found.  Assessment/Plan Major Neurocognitive disorder  Cont assistance with ADLS  Cont supportive care  Gout  Cont with allopurinol   HTN  Cont with benazepril    Neuropathy Cont with gabapentin    Hypothyroidism  Cont with synthyroid  Vit D deficiency  Cont with vit d supplements   30 min Total time spent for obtaining history,  performing a medically appropriate examination and evaluation, reviewing the tests,   documenting clinical information in the electronic or other health record ,care coordination (not separately reported)

## 2023-09-04 ENCOUNTER — Encounter: Payer: Self-pay | Admitting: Sports Medicine

## 2023-09-15 ENCOUNTER — Non-Acute Institutional Stay (SKILLED_NURSING_FACILITY): Payer: Self-pay | Admitting: Nurse Practitioner

## 2023-09-15 DIAGNOSIS — R569 Unspecified convulsions: Secondary | ICD-10-CM | POA: Diagnosis not present

## 2023-09-15 DIAGNOSIS — I509 Heart failure, unspecified: Secondary | ICD-10-CM

## 2023-09-15 DIAGNOSIS — E039 Hypothyroidism, unspecified: Secondary | ICD-10-CM

## 2023-09-15 DIAGNOSIS — Z8673 Personal history of transient ischemic attack (TIA), and cerebral infarction without residual deficits: Secondary | ICD-10-CM | POA: Diagnosis not present

## 2023-09-15 DIAGNOSIS — R2681 Unsteadiness on feet: Secondary | ICD-10-CM

## 2023-09-15 DIAGNOSIS — I10 Essential (primary) hypertension: Secondary | ICD-10-CM

## 2023-09-15 DIAGNOSIS — M109 Gout, unspecified: Secondary | ICD-10-CM

## 2023-09-15 DIAGNOSIS — F0390 Unspecified dementia without behavioral disturbance: Secondary | ICD-10-CM | POA: Diagnosis not present

## 2023-09-15 DIAGNOSIS — M159 Polyosteoarthritis, unspecified: Secondary | ICD-10-CM | POA: Diagnosis not present

## 2023-09-15 DIAGNOSIS — G609 Hereditary and idiopathic neuropathy, unspecified: Secondary | ICD-10-CM

## 2023-09-15 DIAGNOSIS — N1831 Chronic kidney disease, stage 3a: Secondary | ICD-10-CM

## 2023-09-15 NOTE — Assessment & Plan Note (Signed)
 Hx of CVA per CT head 08/02/20 Remote infarcts within the a occipital cortices bilaterally again noted. Interval development of remote infarcts within the left parietal lobe and periventricular white matte. On ASA.  07/27/23. CT head/MR brain:              No acute intracranial abnormality. Mild chronic microvascular ischemic disease with a few scattered remote infarcts involving the left parietal and bilateral occipital lobes.

## 2023-09-15 NOTE — Assessment & Plan Note (Signed)
 Bun/creat 18/1.48 07/27/23

## 2023-09-15 NOTE — Assessment & Plan Note (Signed)
 on Levothyroxine, TSH 2.48 05/19/23

## 2023-09-15 NOTE — Assessment & Plan Note (Signed)
 Hx of TIA. Allergic to statin. On ASA.  HPOA desired no hospital evaluation in the past. ED 07/27/23 CT head/MR brain no acute intracranial changes.

## 2023-09-15 NOTE — Assessment & Plan Note (Signed)
 hx of, likely post fall, off Tramadol , 07/27/23 ED neurology: increase Gabapentin  400mg  every day, f/u neurology out patient-HPOA desires to delay it.

## 2023-09-15 NOTE — Progress Notes (Signed)
 Location: [facility] Room Number: 31-A Place of Service:  SNF (31) Provider:  Carlosdaniel Grob X, NP  Sherlynn Madden, MD  Patient Care Team: Sherlynn Madden, MD as PCP - General (Internal Medicine) Macallister Ashmead X, NP as Nurse Practitioner (Internal Medicine) Charlanne Fredia CROME, MD as Consulting Physician (Internal Medicine) Charlanne Fredia CROME, MD (Internal Medicine)  Extended Emergency Contact Information Primary Emergency Contact: [facility] Phone: [phone] Relation: Relative Secondary Emergency Contact: Vegh,[name]  [place] Home Phone: [phone] Relation: Son  Code Status:  DNR Goals of care: Advanced Directive information    09/15/2023   10:59 AM  Advanced Directives  Does Patient Have a Medical Advance Directive? Yes  Type of Estate agent of [place];Out of facility DNR (pink MOST or yellow form)  Does patient want to make changes to medical advance directive? No - Patient declined  Copy of Healthcare Power of Attorney in Chart? Yes - validated most recent copy scanned in chart (See row information)  Pre-existing out of facility DNR order (yellow form or pink MOST form) Pink MOST form placed in chart (order not valid for inpatient use)     Chief Complaint  Patient presents with   Medical Management of Chronic Issues    Routine visit.     HPI:  Pt is a 89 y.o. female seen today for medical management of chronic diseases.   CHF, 09/30/21 CXR mild congestive heart failure, CXR 10/25/21 showed CHF. Takes Furosemide  20mg  x2/wk, compensated clinically. Echo  EF 60%, DOE/cough on and off. BNP 24 02/17/23             Peripheral neuropathy, taking Gabapentin , Vit B12 Gait abnormality, uses walker to ambulate slowly.       Hx of TIA. Allergic to statin. On ASA.  HPOA desired no hospital evaluation in the past. ED 07/27/23 CT head/MR brain no acute intracranial changes.  Seizure like activity, hx of,  likely post fall, off Tramadol , 07/27/23 ED neurology: increase Gabapentin  400mg  every day, f/u neurology out patient-HPOA desires to delay it.  OA: T5, T8, L3 compression fxs. Takes Tylenol  and Gabapentin  for pain, off Tramadol , DDD per CT head/cervical spine 08/02/20, Vit D 61 05/19/23             Hx of CVA per CT head 08/02/20 Remote infarcts within the a occipital cortices bilaterally again noted. Interval development of remote infarcts within the left parietal lobe and periventricular white matte. On ASA.  07/27/23. CT head/MR brain:              No acute intracranial abnormality. Mild chronic microvascular ischemic disease with a few scattered remote infarcts involving the left parietal and bilateral occipital lobes.            Dementia, 07/15/19 CT head Mild generalized parenchymal atrophy and chronic small vessel ischemic disease, 07/27/23. CT head/MR brain:              No acute intracranial abnormality. Mild chronic microvascular ischemic disease with a few scattered remote infarcts involving the left parietal and bilateral occipital lobes.tolerated Memantine  well. 06/22/22 MMSE 17/30             HTN, loose blood pressure control,  on Benazepril              CKD Bun/creat 18/1.48 07/27/23             Gout, stable, on Allopurinol   Hypothyroidism, on Levothyroxine , TSH 2.48 05/19/23             Abnormal CXR, suggested CT chest, not candidate for workup     Past Medical History:  Diagnosis Date   Cognitive changes    Dementia (HCC)    Depression    Gout    Hypertension    Hypothyroidism    Peripheral neuropathy    No past surgical history on file.  Allergies  Allergen Reactions   Actonel [Risedronate Sodium]    Actonel [Risedronate] Other (See Comments)    Allergic, per [facility]   Hct [Hydrochlorothiazide]    Hydrochlorothiazide Other (See Comments)    Allergic, per [facility]   Lipitor [Atorvastatin Calcium]    Lipitor [Atorvastatin] Other (See Comments)    Allergic,  per [facility]   Neomycin Other (See Comments)    Unknown been so long ago, a Dermatologist told me   Neomycin Other (See Comments)    Allergic, per [facility]   Polysporin [Bacitracin-Polymyxin B]    Polysporin [Bacitracin-Polymyxin B] Other (See Comments)    Allergic, per [facility]   Prolia  [Denosumab ]    Prolia  [Denosumab ] Other (See Comments)   Sulfa Antibiotics    Sulfa Antibiotics Other (See Comments)    Allergic, per [facility]   Zocor [Simvastatin]    Zocor [Simvastatin] Other (See Comments)    Allergic, per [facility]    Allergies as of 09/15/2023       Reactions   Actonel [risedronate Sodium]    Actonel [risedronate] Other (See Comments)   Allergic, per [facility]   Hct [hydrochlorothiazide]    Hydrochlorothiazide Other (See Comments)   Allergic, per [facility]   Lipitor [atorvastatin Calcium]    Lipitor [atorvastatin] Other (See Comments)   Allergic, per [facility]   Neomycin Other (See Comments)   Unknown been so long ago, a Dermatologist told me   Neomycin Other (See Comments)   Allergic, per [facility]   Polysporin [bacitracin-polymyxin B]    Polysporin [bacitracin-polymyxin B] Other (See Comments)   Allergic, per [facility]   Prolia  [denosumab ]    Prolia  [denosumab ] Other (See Comments)   Sulfa Antibiotics    Sulfa Antibiotics Other (See Comments)   Allergic, per [facility]   Zocor [simvastatin]    Zocor [simvastatin] Other (See Comments)   Allergic, per [facility]        Medication List        Accurate as of September 15, 2023 11:59 PM. If you have any questions, ask your nurse or doctor.          acetaminophen  325 MG tablet Commonly known as: TYLENOL  Take 650 mg by mouth in the morning, at noon, and at bedtime.   allopurinol  100 MG tablet Commonly known as: ZYLOPRIM  Take 100 mg by mouth daily.   aspirin  81 MG chewable tablet Chew 81 mg by mouth daily.   benazepril  5 MG tablet Commonly known as: LOTENSIN  Take 5 mg by mouth daily.   Calcium Carb-Cholecalciferol 500-400 MG-UNIT Tabs Take 2  tablets by mouth daily.   furosemide  20 MG tablet Commonly known as: LASIX  Take 20 mg by mouth 2 (two) times a week. Monday & Thursday.   gabapentin  400 MG capsule Commonly known as: NEURONTIN  Take 1 capsule (400 mg total) by mouth at bedtime.   levothyroxine  100 MCG tablet Commonly known as: SYNTHROID  Take 100 mcg by mouth once a week. On Wednesday   levothyroxine  50 MCG tablet Commonly known as: SYNTHROID  Take 50 mcg by mouth daily before breakfast. Sun,Mon,Tue, Thur,Fri,Sat  memantine  10 MG tablet Commonly known as: NAMENDA  Take 5 mg by mouth 2 (two) times daily.   potassium chloride  20 MEQ packet Commonly known as: KLOR-CON  Take 20 mEq by mouth 2 (two) times a week. Monday and Thursday   Vitamin D  50 MCG (2000 UT) Caps Take 2,000 Units by mouth daily.        Review of Systems  Constitutional:  Negative for activity change, diaphoresis and fever.  HENT:  Positive for hearing loss. Negative for congestion.   Eyes:  Negative for visual disturbance.  Respiratory:  Negative for cough, shortness of breath and wheezing.        DOE   Cardiovascular:  Negative for leg swelling.  Gastrointestinal:  Negative for abdominal pain and constipation.  Genitourinary:  Negative for dysuria and urgency.  Musculoskeletal:  Positive for arthralgias, back pain and gait problem.       Chronic, aches.  Baseline walker with walker slowly with SBA  Skin:  Negative for color change.  Neurological:  Negative for facial asymmetry, speech difficulty and weakness.  Psychiatric/Behavioral:  Positive for confusion. Negative for sleep disturbance. The patient is not nervous/anxious.     Immunization History  Administered Date(s) Administered   Fluad Quad(high Dose 65+) 01/01/2022   Influenza, High Dose Seasonal PF 12/11/2018, 01/01/2022, 12/10/2022   Influenza-Unspecified 03/24/2018, 12/21/2019, 12/26/2020   Moderna Covid-19 Vaccine  Bivalent Booster 22yrs & up 01/09/2022, 12/24/2022    Moderna SARS-COV2 Booster Vaccination 02/20/2021, 07/26/2021   Moderna Sars-Covid-2 Vaccination 03/12/2019, 04/09/2019, 04/21/2019, 01/17/2020, 08/07/2020   Pneumococcal Conjugate-13 09/09/2012, 10/28/2013   Pneumococcal Polysaccharide-23 04/02/2009, 04/16/2009   Tdap 07/15/2019   Zoster Recombinant(Shingrix) 04/16/2009, 06/20/2022   Zoster, Live 04/16/2009   Zoster, Unspecified 04/16/2009   Pertinent  Health Maintenance Due  Topic Date Due   INFLUENZA VACCINE  10/09/2023   DEXA SCAN  Discontinued      01/07/2022    9:04 AM 02/06/2022   10:48 AM 04/21/2022    3:50 PM 06/12/2022   10:12 AM 06/12/2022   12:58 PM  Fall Risk  Falls in the past year? 0 0 0 0 1  Was there an injury with Fall? 0 0 0 0 0  Fall Risk Category Calculator 0 0 0 0 2  Fall Risk Category (Retired) Low  Low      (RETIRED) Patient Fall Risk Level Moderate fall risk  Moderate fall risk      Patient at Risk for Falls Due to History of fall(s) History of fall(s) No Fall Risks No Fall Risks History of fall(s);Impaired balance/gait;Impaired mobility  Fall risk Follow up Falls evaluation completed  Falls evaluation completed  Falls evaluation completed Falls evaluation completed Falls evaluation completed;Education provided;Falls prevention discussed;Follow up appointment     Data saved with a previous flowsheet row definition   Functional Status Survey:    Vitals:   09/15/23 1058  BP: 124/80  Pulse: 72  Resp: 18  Temp: 97.6 F (36.4 C)  SpO2: 97%  Weight: 160 lb 8 oz (72.8 kg)   Body mass index is 26.71 kg/m. Physical Exam Vitals and nursing note reviewed.  Constitutional:      Appearance: Normal appearance.  HENT:     Head: Normocephalic and atraumatic.     Nose: Nose normal.     Mouth/Throat:     Mouth: Mucous membranes are moist.  Eyes:     Extraocular Movements: Extraocular movements intact.     Conjunctiva/sclera: Conjunctivae normal.     Pupils: Pupils are equal, round,  and reactive to light.   Cardiovascular:     Rate and Rhythm: Normal rate and regular rhythm.     Heart sounds: No murmur heard.    Comments: Weak dorsalis pedis pulses R+L from previous examination.  Pulmonary:     Effort: Pulmonary effort is normal.     Breath sounds: Rales present.     Comments: Bibasilar rales.  Abdominal:     General: Bowel sounds are normal.     Palpations: Abdomen is soft.     Tenderness: There is no abdominal tenderness.  Musculoskeletal:        General: No tenderness.     Cervical back: Normal range of motion and neck supple.     Right lower leg: No edema.     Left lower leg: No edema.  Skin:    General: Skin is warm and dry.  Neurological:     General: No focal deficit present.     Mental Status: She is alert. Mental status is at baseline.     Gait: Gait abnormal.     Comments: Ambulates with walker is her baseline Right facial, RUE, RLE weakness-resolved  Psychiatric:        Mood and Affect: Mood normal.        Behavior: Behavior normal.     Labs reviewed: Recent Labs    02/24/23 0000 04/22/23 0000 07/27/23 1401  NA 140 137 141  K 4.1 4.0 3.6  CL 108 103 104  CO2 23* 24* 27  GLUCOSE  --   --  96  BUN 31* 21 18  CREATININE 1.4* 1.3* 1.48*  CALCIUM 8.8 8.8 9.1   Recent Labs    02/12/23 0000 07/27/23 1401  AST 17 17  ALT 14 11  ALKPHOS 98 69  BILITOT  --  0.5  PROT  --  6.4*  ALBUMIN 4.0 3.2*   Recent Labs    02/10/23 0000 04/22/23 0000 07/27/23 1401  WBC 8.8 9.3 9.8  NEUTROABS 6,961.00 7,933.00 7.8*  HGB 13.0 1.8* 12.3  HCT 39 36 39.2  MCV  --   --  102.1*  PLT 205 186 202   Lab Results  Component Value Date   TSH 2.48 05/19/2023   No results found for: HGBA1C No results found for: CHOL, HDL, LDLCALC, LDLDIRECT, TRIG, CHOLHDL  Significant Diagnostic Results in last 30 days:  No results found.  Assessment/Plan Seizure-like activity (HCC) hx of, likely post fall, off Tramadol , 07/27/23 ED neurology: increase Gabapentin   400mg  every day, f/u neurology out patient-HPOA desires to delay it.  Generalized osteoarthritis of multiple sites T5, T8, L3 compression fxs. Takes Tylenol  and Gabapentin  for pain, off Tramadol , DDD per CT head/cervical spine 08/02/20, Vit D 61 05/19/23  History of CVA (cerebrovascular accident) Hx of CVA per CT head 08/02/20 Remote infarcts within the a occipital cortices bilaterally again noted. Interval development of remote infarcts within the left parietal lobe and periventricular white matte. On ASA.  07/27/23. CT head/MR brain:              No acute intracranial abnormality. Mild chronic microvascular ischemic disease with a few scattered remote infarcts involving the left parietal and bilateral occipital lobes.  Major neurocognitive disorder (HCC)   07/15/19 CT head Mild generalized parenchymal atrophy and chronic small vessel ischemic disease 07/27/23. CT head/MR brain:              No acute intracranial abnormality. Mild chronic microvascular ischemic disease with a few scattered remote infarcts  involving the left parietal and bilateral occipital lobes.tolerated Memantine  well. 06/22/22 MMSE 17/30  HTN (hypertension)  loose blood pressure control,  on Benazepril   CKD (chronic kidney disease) stage 3, GFR 30-59 ml/min (HCC) Bun/creat 18/1.48 07/27/23  Gout stable, on Allopurinol      Hypothyroidism  on Levothyroxine , TSH 2.48 05/19/23  Congestive heart failure (CHF) (HCC)  09/30/21 CXR mild congestive heart failure, CXR 10/25/21 showed CHF. Takes Furosemide  20mg  x2/wk, compensated clinically. Echo  EF 60%, DOE/cough on and off. BNP 24 02/17/23  Peripheral neuropathy taking Gabapentin , Vit B12, ambulates with walker.   Gait instability uses walker to ambulate slowly.    History of TIAs Hx of TIA. Allergic to statin. On ASA.  HPOA desired no hospital evaluation in the past. ED 07/27/23 CT head/MR brain no acute intracranial changes.      Family/ staff Communication: Plan of  care reviewed with the patient and charge nurse  Labs/tests ordered: None

## 2023-09-15 NOTE — Assessment & Plan Note (Signed)
 09/30/21 CXR mild congestive heart failure, CXR 10/25/21 showed CHF. Takes Furosemide  20mg  x2/wk, compensated clinically. Echo EF 60%, DOE/cough on and off. BNP 24 02/17/23

## 2023-09-15 NOTE — Assessment & Plan Note (Signed)
 07/15/19 CT head Mild generalized parenchymal atrophy and chronic small vessel ischemic disease 07/27/23. CT head/MR brain:              No acute intracranial abnormality. Mild chronic microvascular ischemic disease with a few scattered remote infarcts involving the left parietal and bilateral occipital lobes.tolerated Memantine  well. 06/22/22 MMSE 17/30

## 2023-09-15 NOTE — Assessment & Plan Note (Signed)
 loose blood pressure control,  on Benazepril 

## 2023-09-15 NOTE — Assessment & Plan Note (Signed)
 T5, T8, L3 compression fxs. Takes Tylenol  and Gabapentin  for pain, off Tramadol , DDD per CT head/cervical spine 08/02/20, Vit D 61 05/19/23

## 2023-09-15 NOTE — Assessment & Plan Note (Signed)
uses walker to ambulate slowly.     

## 2023-09-15 NOTE — Assessment & Plan Note (Signed)
taking Gabapentin, Vit B12, ambulates with walker.

## 2023-09-15 NOTE — Assessment & Plan Note (Signed)
stable, on Allopurinol       

## 2023-09-18 ENCOUNTER — Encounter: Payer: Self-pay | Admitting: Nurse Practitioner

## 2023-10-05 ENCOUNTER — Non-Acute Institutional Stay (SKILLED_NURSING_FACILITY): Payer: Self-pay | Admitting: Nurse Practitioner

## 2023-10-05 ENCOUNTER — Encounter: Payer: Self-pay | Admitting: Nurse Practitioner

## 2023-10-05 DIAGNOSIS — R569 Unspecified convulsions: Secondary | ICD-10-CM

## 2023-10-05 DIAGNOSIS — R112 Nausea with vomiting, unspecified: Secondary | ICD-10-CM | POA: Diagnosis not present

## 2023-10-05 DIAGNOSIS — I509 Heart failure, unspecified: Secondary | ICD-10-CM

## 2023-10-05 DIAGNOSIS — I10 Essential (primary) hypertension: Secondary | ICD-10-CM

## 2023-10-05 DIAGNOSIS — E039 Hypothyroidism, unspecified: Secondary | ICD-10-CM

## 2023-10-05 LAB — HEPATIC FUNCTION PANEL
ALT: 9 U/L (ref 7–35)
AST: 14 (ref 13–35)
Alkaline Phosphatase: 75 (ref 25–125)
Bilirubin, Total: 0.5

## 2023-10-05 LAB — CBC AND DIFFERENTIAL
HCT: 39 (ref 36–46)
Hemoglobin: 13 (ref 12.0–16.0)
Neutrophils Absolute: 5873
Platelets: 216 K/uL (ref 150–400)
WBC: 7.8

## 2023-10-05 LAB — BASIC METABOLIC PANEL WITH GFR
BUN: 33 — AB (ref 4–21)
CO2: 27 — AB (ref 13–22)
Chloride: 1 — AB (ref 99–108)
Creatinine: 1.4 — AB (ref 0.5–1.1)
Glucose: 103
Potassium: 4.6 meq/L (ref 3.5–5.1)
Sodium: 139 (ref 137–147)

## 2023-10-05 LAB — COMPREHENSIVE METABOLIC PANEL WITH GFR
Albumin: 4.3 (ref 3.5–5.0)
Calcium: 9.7 (ref 8.7–10.7)
Globulin: 3
eGFR: 34

## 2023-10-05 LAB — CBC: RBC: 3.94 (ref 3.87–5.11)

## 2023-10-05 NOTE — Assessment & Plan Note (Signed)
 09/30/21 CXR mild congestive heart failure, CXR 10/25/21 showed CHF. Takes Furosemide  20mg  x2/wk, compensated clinically. Echo EF 60%, DOE/cough on and off. BNP 24 02/17/23

## 2023-10-05 NOTE — Assessment & Plan Note (Signed)
 on Levothyroxine, TSH 2.48 05/19/23

## 2023-10-05 NOTE — Progress Notes (Signed)
 Location:   [facility] Room Number: 34 Place of Service:  SNF (31) Provider: [place] Krisinda Giovanni NP  Sherlynn Madden, MD  Patient Care Team: Sherlynn Madden, MD as PCP - General (Internal Medicine) Berthel Bagnall X, NP as Nurse Practitioner (Internal Medicine) Charlanne Fredia CROME, MD as Consulting Physician (Internal Medicine) Charlanne Fredia CROME, MD (Internal Medicine)  Extended Emergency Contact Information Primary Emergency Contact: [facility] Phone: [phone] Relation: Relative Secondary Emergency Contact: Karge,[name]  [place] Home Phone: [phone] Relation: Son  Code Status: DNR Goals of care: Advanced Directive information    09/15/2023   10:59 AM  Advanced Directives  Does Patient Have a Medical Advance Directive? Yes  Type of Estate agent of [place];Out of facility DNR (pink MOST or yellow form)  Does patient want to make changes to medical advance directive? No - Patient declined  Copy of Healthcare Power of Attorney in Chart? Yes - validated most recent copy scanned in chart (See row information)  Pre-existing out of facility DNR order (yellow form or pink MOST form) Pink MOST form placed in chart (order not valid for inpatient use)     Chief Complaint  Patient presents with   Acute Visit    Nausea, vomiting, not feeling good.     HPI:  Pt is a 89 y.o. female seen today for an acute visit for sudden onset nauseated, vomited with the ingested food x1.  The patient stated her last BM was yesterday, denied headache, change of vision, chest pain, palpitation, abdominal pain, dysuria, or urinary urgency.  No noted focal neurological symptoms.  She is afebrile, no O2 desaturation. CHF, 09/30/21 CXR mild congestive heart failure, CXR 10/25/21 showed CHF. Takes Furosemide  20mg  x2/wk, compensated clinically. Echo  EF 60%, DOE/cough on and off. BNP 24 02/17/23             Peripheral neuropathy, taking Gabapentin , Vit  B12 Gait abnormality, uses walker to ambulate slowly.       Hx of TIA. Allergic to statin. On ASA.  HPOA desired no hospital evaluation in the past. ED 07/27/23 CT head/MR brain no acute intracranial changes.  Seizure like activity, hx of, likely post fall, off Tramadol , 07/27/23 ED neurology: increase Gabapentin  400mg  every day, f/u neurology out patient-HPOA desires to delay it.  OA: T5, T8, L3 compression fxs. Takes Tylenol  and Gabapentin  for pain, off Tramadol , DDD per CT head/cervical spine 08/02/20, Vit D 61 05/19/23             Hx of CVA per CT head 08/02/20 Remote infarcts within the a occipital cortices bilaterally again noted. Interval development of remote infarcts within the left parietal lobe and periventricular white matte. On ASA.  07/27/23. CT head/MR brain:              Dementia, 07/15/19 CT head Mild generalized parenchymal atrophy and chronic small vessel ischemic disease, 07/27/23. CT head/MR brain:              No acute intracranial abnormality. Mild chronic microvascular ischemic disease with a few scattered remote infarcts involving the left parietal and bilateral occipital lobes.tolerated Memantine  well. 06/22/22 MMSE 17/30             HTN, loose blood pressure control, on Benazepril              CKD Bun/creat 18/1.48 07/27/23             Gout, stable, on Allopurinol   Hypothyroidism, on Levothyroxine , TSH 2.48 05/19/23             Abnormal CXR, suggested CT chest, not candidate for workup     Past Medical History:  Diagnosis Date   Cognitive changes    Dementia (HCC)    Depression    Gout    Hypertension    Hypothyroidism    Peripheral neuropathy    History reviewed. No pertinent surgical history.  Allergies  Allergen Reactions   Actonel [Risedronate Sodium]    Actonel [Risedronate] Other (See Comments)    Allergic, per [facility]   Hct [Hydrochlorothiazide]    Hydrochlorothiazide Other (See Comments)    Allergic, per [facility]   Lipitor [Atorvastatin  Calcium]    Lipitor [Atorvastatin] Other (See Comments)    Allergic, per [facility]   Neomycin Other (See Comments)    Unknown been so long ago, a Dermatologist told me   Neomycin Other (See Comments)    Allergic, per [facility]   Polysporin [Bacitracin-Polymyxin B]    Polysporin [Bacitracin-Polymyxin B] Other (See Comments)    Allergic, per [facility]   Prolia  [Denosumab ]    Prolia  [Denosumab ] Other (See Comments)   Sulfa Antibiotics    Sulfa Antibiotics Other (See Comments)    Allergic, per [facility]   Zocor [Simvastatin]    Zocor [Simvastatin] Other (See Comments)    Allergic, per [facility]    Allergies as of 10/05/2023       Reactions   Actonel [risedronate Sodium]    Actonel [risedronate] Other (See Comments)   Allergic, per [facility]   Hct [hydrochlorothiazide]    Hydrochlorothiazide Other (See Comments)   Allergic, per [facility]   Lipitor [atorvastatin Calcium]    Lipitor [atorvastatin] Other (See Comments)   Allergic, per [facility]   Neomycin Other (See Comments)   Unknown been so long ago, a Dermatologist told me   Neomycin Other (See Comments)   Allergic, per [facility]   Polysporin [bacitracin-polymyxin B]    Polysporin [bacitracin-polymyxin B] Other (See Comments)   Allergic, per [facility]   Prolia  [denosumab ]    Prolia  [denosumab ] Other (See Comments)   Sulfa Antibiotics    Sulfa Antibiotics Other (See Comments)   Allergic, per [facility]   Zocor [simvastatin]    Zocor [simvastatin] Other (See Comments)   Allergic, per [facility]        Medication List        Accurate as of October 05, 2023 12:56 PM. If you have any questions, ask your nurse or doctor.          acetaminophen  325 MG tablet Commonly known as: TYLENOL  Take 650 mg by mouth in the morning, at noon, and at bedtime.   allopurinol  100 MG tablet Commonly known as: ZYLOPRIM  Take 100 mg by mouth daily.   aspirin  81 MG chewable tablet Chew 81 mg by mouth daily.   benazepril  5 MG tablet Commonly known as: LOTENSIN  Take 5 mg by  mouth daily.   Calcium Carb-Cholecalciferol 500-400 MG-UNIT Tabs Take 2 tablets by mouth daily.   furosemide  20 MG tablet Commonly known as: LASIX  Take 20 mg by mouth 2 (two) times a week. Monday & Thursday.   gabapentin  400 MG capsule Commonly known as: NEURONTIN  Take 1 capsule (400 mg total) by mouth at bedtime.   levothyroxine  100 MCG tablet Commonly known as: SYNTHROID  Take 100 mcg by mouth once a week. On Wednesday   levothyroxine  50 MCG tablet Commonly known as: SYNTHROID  Take 50 mcg by mouth daily before breakfast. Sun,Mon,Tue, Thur,Fri,Sat  memantine  10 MG tablet Commonly known as: NAMENDA  Take 5 mg by mouth 2 (two) times daily.   potassium chloride  20 MEQ packet Commonly known as: KLOR-CON  Take 20 mEq by mouth 2 (two) times a week. Monday and Thursday   Vitamin D  50 MCG (2000 UT) Caps Take 2,000 Units by mouth daily.        Review of Systems  Constitutional:  Negative for activity change, diaphoresis and fever.  HENT:  Positive for hearing loss. Negative for congestion.   Eyes:  Negative for visual disturbance.  Respiratory:  Negative for cough, shortness of breath and wheezing.        DOE   Cardiovascular:  Negative for leg swelling.  Gastrointestinal:  Positive for nausea and vomiting. Negative for abdominal pain, constipation and diarrhea.  Genitourinary:  Negative for dysuria and urgency.  Musculoskeletal:  Positive for arthralgias, back pain and gait problem.       Chronic, aches.  Baseline walker with walker slowly with SBA  Skin:  Negative for color change.  Neurological:  Negative for facial asymmetry, speech difficulty and weakness.  Psychiatric/Behavioral:  Positive for confusion. Negative for sleep disturbance. The patient is not nervous/anxious.     Immunization History  Administered Date(s) Administered   Fluad Quad(high Dose 65+) 01/01/2022   Influenza, High Dose Seasonal PF 12/11/2018, 01/01/2022, 12/10/2022   Influenza-Unspecified  03/24/2018, 12/21/2019, 12/26/2020   Moderna Covid-19 Vaccine  Bivalent Booster 61yrs & up 01/09/2022, 12/24/2022   Moderna SARS-COV2 Booster Vaccination 02/20/2021, 07/26/2021   Moderna Sars-Covid-2 Vaccination 03/12/2019, 04/09/2019, 04/21/2019, 01/17/2020, 08/07/2020   Pneumococcal Conjugate-13 09/09/2012, 10/28/2013   Pneumococcal Polysaccharide-23 04/02/2009, 04/16/2009   Tdap 07/15/2019   Zoster Recombinant(Shingrix) 04/16/2009, 06/20/2022   Zoster, Live 04/16/2009   Zoster, Unspecified 04/16/2009   Pertinent  Health Maintenance Due  Topic Date Due   INFLUENZA VACCINE  10/09/2023   DEXA SCAN  Discontinued      01/07/2022    9:04 AM 02/06/2022   10:48 AM 04/21/2022    3:50 PM 06/12/2022   10:12 AM 06/12/2022   12:58 PM  Fall Risk  Falls in the past year? 0 0 0 0 1  Was there an injury with Fall? 0 0 0 0 0  Fall Risk Category Calculator 0 0 0 0 2  Fall Risk Category (Retired) Low  Low      (RETIRED) Patient Fall Risk Level Moderate fall risk  Moderate fall risk      Patient at Risk for Falls Due to History of fall(s) History of fall(s) No Fall Risks No Fall Risks History of fall(s);Impaired balance/gait;Impaired mobility  Fall risk Follow up Falls evaluation completed  Falls evaluation completed  Falls evaluation completed Falls evaluation completed Falls evaluation completed;Education provided;Falls prevention discussed;Follow up appointment     Data saved with a previous flowsheet row definition   Functional Status Survey:    Vitals:   10/05/23 1243  BP: (!) 171/82  Pulse: 70  Resp: 18  Temp: (!) 97 F (36.1 C)  SpO2: 97%  Weight: 160 lb 8 oz (72.8 kg)   Body mass index is 26.71 kg/m. Physical Exam Vitals and nursing note reviewed.  Constitutional:      Comments: Appears tired  HENT:     Head: Normocephalic and atraumatic.     Nose: Nose normal.     Mouth/Throat:     Mouth: Mucous membranes are moist.  Eyes:     Extraocular Movements: Extraocular movements  intact.     Conjunctiva/sclera: Conjunctivae normal.  Pupils: Pupils are equal, round, and reactive to light.  Cardiovascular:     Rate and Rhythm: Normal rate and regular rhythm.     Heart sounds: No murmur heard.    Comments: Weak dorsalis pedis pulses R+L from previous examination.  Pulmonary:     Effort: Pulmonary effort is normal.     Breath sounds: Rales present.     Comments: Bibasilar rales.  Abdominal:     General: Bowel sounds are normal.     Palpations: Abdomen is soft.     Tenderness: There is no abdominal tenderness.  Musculoskeletal:        General: No tenderness.     Cervical back: Normal range of motion and neck supple.     Right lower leg: No edema.     Left lower leg: No edema.  Skin:    General: Skin is warm and dry.  Neurological:     General: No focal deficit present.     Mental Status: She is alert. Mental status is at baseline.     Gait: Gait abnormal.     Comments: Ambulates with walker is her baseline Hx of resolved Right facial, RUE, RLE weakness  Psychiatric:        Mood and Affect: Mood normal.        Behavior: Behavior normal.     Labs reviewed: Recent Labs    02/24/23 0000 04/22/23 0000 07/27/23 1401  NA 140 137 141  K 4.1 4.0 3.6  CL 108 103 104  CO2 23* 24* 27  GLUCOSE  --   --  96  BUN 31* 21 18  CREATININE 1.4* 1.3* 1.48*  CALCIUM 8.8 8.8 9.1   Recent Labs    02/12/23 0000 07/27/23 1401  AST 17 17  ALT 14 11  ALKPHOS 98 69  BILITOT  --  0.5  PROT  --  6.4*  ALBUMIN 4.0 3.2*   Recent Labs    02/10/23 0000 04/22/23 0000 07/27/23 1401  WBC 8.8 9.3 9.8  NEUTROABS 6,961.00 7,933.00 7.8*  HGB 13.0 1.8* 12.3  HCT 39 36 39.2  MCV  --   --  102.1*  PLT 205 186 202   Lab Results  Component Value Date   TSH 2.48 05/19/2023   No results found for: HGBA1C No results found for: CHOL, HDL, LDLCALC, LDLDIRECT, TRIG, CHOLHDL  Significant Diagnostic Results in last 30 days:  No results  found.  Assessment/Plan: Nausea & vomiting  sudden onset nauseated, vomited with the ingested food x1.  The patient stated her last BM was yesterday, denied headache, change of vision, chest pain, palpitation, abdominal pain, dysuria, or urinary urgency.  No noted focal neurological symptoms.  She is afebrile, no O2 desaturation. Stat CBC/diff, CMP/eGFR Zofran  4 mg every 8 hours as needed for 72 hours Vital signs every shift for 72 hours Adding Omeprazole 20mg  qd  HTN (hypertension) loose blood pressure control, on Benazepril , Bun/creat 18/1.48 07/27/23  Hypothyroidism  on Levothyroxine , TSH 2.48 05/19/23  Seizure-like activity (HCC) Seizure like activity, hx of, likely post fall, off Tramadol , 07/27/23 ED neurology: increase Gabapentin  400mg  every day, f/u neurology out patient-HPOA desires to delay it.   Congestive heart failure (CHF) (HCC)  09/30/21 CXR mild congestive heart failure, CXR 10/25/21 showed CHF. Takes Furosemide  20mg  x2/wk, compensated clinically. Echo  EF 60%, DOE/cough on and off. BNP 24 02/17/23    Family/ staff Communication: Plan of care reviewed with the patient and charge nurse  Labs/tests ordered:  CBC/diff,  CMP/eGFR stat

## 2023-10-05 NOTE — Assessment & Plan Note (Addendum)
 sudden onset nauseated, vomited with the ingested food x1.  The patient stated her last BM was yesterday, denied headache, change of vision, chest pain, palpitation, abdominal pain, dysuria, or urinary urgency.  No noted focal neurological symptoms.  She is afebrile, no O2 desaturation. Stat CBC/diff, CMP/eGFR Zofran  4 mg every 8 hours as needed for 72 hours Vital signs every shift for 72 hours Adding Omeprazole 20mg  qd

## 2023-10-05 NOTE — Assessment & Plan Note (Signed)
 loose blood pressure control, on Benazepril , Bun/creat 18/1.48 07/27/23

## 2023-10-05 NOTE — Assessment & Plan Note (Signed)
 Seizure like activity, hx of, likely post fall, off Tramadol , 07/27/23 ED neurology: increase Gabapentin  400mg  every day, f/u neurology out patient-HPOA desires to delay it.

## 2023-10-12 ENCOUNTER — Non-Acute Institutional Stay (SKILLED_NURSING_FACILITY): Payer: Self-pay | Admitting: Nurse Practitioner

## 2023-10-12 ENCOUNTER — Encounter: Payer: Self-pay | Admitting: Nurse Practitioner

## 2023-10-12 DIAGNOSIS — G609 Hereditary and idiopathic neuropathy, unspecified: Secondary | ICD-10-CM | POA: Diagnosis not present

## 2023-10-12 DIAGNOSIS — I10 Essential (primary) hypertension: Secondary | ICD-10-CM

## 2023-10-12 DIAGNOSIS — I509 Heart failure, unspecified: Secondary | ICD-10-CM

## 2023-10-12 DIAGNOSIS — R569 Unspecified convulsions: Secondary | ICD-10-CM

## 2023-10-12 DIAGNOSIS — Z8673 Personal history of transient ischemic attack (TIA), and cerebral infarction without residual deficits: Secondary | ICD-10-CM

## 2023-10-12 DIAGNOSIS — K219 Gastro-esophageal reflux disease without esophagitis: Secondary | ICD-10-CM | POA: Insufficient documentation

## 2023-10-12 DIAGNOSIS — K649 Unspecified hemorrhoids: Secondary | ICD-10-CM | POA: Diagnosis not present

## 2023-10-12 DIAGNOSIS — N1831 Chronic kidney disease, stage 3a: Secondary | ICD-10-CM

## 2023-10-12 DIAGNOSIS — M159 Polyosteoarthritis, unspecified: Secondary | ICD-10-CM

## 2023-10-12 NOTE — Assessment & Plan Note (Signed)
 Bun/creat 33/1.4 10/05/23

## 2023-10-12 NOTE — Assessment & Plan Note (Signed)
 Hx of CVA per CT head 08/02/20 Remote infarcts within the a occipital cortices bilaterally again noted. Interval development of remote infarcts within the left parietal lobe and periventricular white matte. On ASA.  07/27/23. CT head/MR brain:

## 2023-10-12 NOTE — Assessment & Plan Note (Signed)
 Allergic to statin. On ASA.  HPOA desired no hospital evaluation in the past. ED 07/27/23 CT head/MR brain no acute intracranial changes.

## 2023-10-12 NOTE — Progress Notes (Unsigned)
 Location:   S[facility] Room Number: 70 Place of Service:  SNF (31) Provider: Larwance Rafaela Dinius NP  Sherlynn Madden, MD  Patient Care Team: Sherlynn Madden, MD as PCP - General (Internal Medicine) Haddie Bruhl X, NP as Nurse Practitioner (Internal Medicine) Charlanne Fredia CROME, MD as Consulting Physician (Internal Medicine) Charlanne Fredia CROME, MD (Internal Medicine)  Extended Emergency Contact Information Primary Emergency Contact: [facility] Phone: [phone] Relation: Relative Secondary Emergency Contact: Jaimes,[name]  [place] Home Phone: [phone] Relation: Son  Code Status:  DNR Goals of care: Advanced Directive information    09/15/2023   10:59 AM  Advanced Directives  Does Patient Have a Medical Advance Directive? Yes  Type of Estate agent of [place];Out of facility DNR (pink MOST or yellow form)  Does patient want to make changes to medical advance directive? No - Patient declined  Copy of Healthcare Power of Attorney in Chart? Yes - validated most recent copy scanned in chart (See row information)  Pre-existing out of facility DNR order (yellow form or pink MOST form) Pink MOST form placed in chart (order not valid for inpatient use)     Chief Complaint  Patient presents with  . Medical Management of Chronic Issues    HPI:  Pt is a 89 y.o. female seen today for medical management of chronic diseases.    Hemorrhoids, bright red blood noted after a BM, no further anal/rectal bleed, denied abd pain/discomfort, nausea, vomiting.   GERD, stable, on Omeprazole, Hgb 13 10/05/23 CHF, 09/30/21 CXR mild congestive heart failure, CXR 10/25/21 showed CHF. Takes Furosemide  20mg  x2/wk, compensated clinically. Echo  EF 60%, DOE/cough on and off. BNP 24 02/17/23             Peripheral neuropathy, taking Gabapentin , Vit B12 Gait abnormality, uses walker to ambulate slowly.       Hx of TIA. Allergic to statin. On ASA.  HPOA  desired no hospital evaluation in the past. ED 07/27/23 CT head/MR brain no acute intracranial changes.  Seizure like activity, hx of, likely post fall, off Tramadol , 07/27/23 ED neurology: increase Gabapentin  400mg  every day, f/u neurology out patient-HPOA desires to delay it.  OA: T5, T8, L3 compression fxs. Takes Tylenol  and Gabapentin  for pain, off Tramadol , DDD per CT head/cervical spine 08/02/20, Vit D 61 05/19/23             Hx of CVA per CT head 08/02/20 Remote infarcts within the a occipital cortices bilaterally again noted. Interval development of remote infarcts within the left parietal lobe and periventricular white matte. On ASA.  07/27/23. CT head/MR brain:              Dementia, 07/15/19 CT head Mild generalized parenchymal atrophy and chronic small vessel ischemic disease, 07/27/23. CT head/MR brain:              No acute intracranial abnormality. Mild chronic microvascular ischemic disease with a few scattered remote infarcts involving the left parietal and bilateral occipital lobes.tolerated Memantine  well. 06/22/22 MMSE 17/30             HTN, loose blood pressure control, on Benazepril              CKD Bun/creat 33/1.4 10/05/23             Gout, stable, on Allopurinol                    Hypothyroidism, on Levothyroxine , TSH 2.48 05/19/23  Abnormal CXR, suggested CT chest, not candidate for workup       Past Medical History:  Diagnosis Date  . Cognitive changes   . Dementia (HCC)   . Depression   . Gout   . Hypertension   . Hypothyroidism   . Peripheral neuropathy    History reviewed. No pertinent surgical history.  Allergies  Allergen Reactions  . Actonel [Risedronate Sodium]   . Actonel [Risedronate] Other (See Comments)    Allergic, per MAR  . Hct [Hydrochlorothiazide]   . Hydrochlorothiazide Other (See Comments)    Allergic, per MAR  . Lipitor [Atorvastatin Calcium]   . Lipitor [Atorvastatin] Other (See Comments)    Allergic, per MAR  . Neomycin Other  (See Comments)    Unknown been so long ago, a Dermatologist told me  . Neomycin Other (See Comments)    Allergic, per MAR  . Polysporin [Bacitracin-Polymyxin B]   . Polysporin [Bacitracin-Polymyxin B] Other (See Comments)    Allergic, per MAR  . Prolia  [Denosumab ]   . Prolia  [Denosumab ] Other (See Comments)  . Sulfa Antibiotics   . Sulfa Antibiotics Other (See Comments)    Allergic, per MAR  . Zocor [Simvastatin]   . Zocor [Simvastatin] Other (See Comments)    Allergic, per MAR    Allergies as of 10/12/2023       Reactions   Actonel [risedronate Sodium]    Actonel [risedronate] Other (See Comments)   Allergic, per MAR   Hct [hydrochlorothiazide]    Hydrochlorothiazide Other (See Comments)   Allergic, per MAR   Lipitor [atorvastatin Calcium]    Lipitor [atorvastatin] Other (See Comments)   Allergic, per MAR   Neomycin Other (See Comments)   Unknown been so long ago, a Dermatologist told me   Neomycin Other (See Comments)   Allergic, per MAR   Polysporin [bacitracin-polymyxin B]    Polysporin [bacitracin-polymyxin B] Other (See Comments)   Allergic, per MAR   Prolia  [denosumab ]    Prolia  [denosumab ] Other (See Comments)   Sulfa Antibiotics    Sulfa Antibiotics Other (See Comments)   Allergic, per MAR   Zocor [simvastatin]    Zocor [simvastatin] Other (See Comments)   Allergic, per [facility]        Medication List        Accurate as of October 12, 2023 12:28 PM. If you have any questions, ask your nurse or doctor.          acetaminophen  325 MG tablet Commonly known as: TYLENOL  Take 650 mg by mouth in the morning, at noon, and at bedtime.   allopurinol  100 MG tablet Commonly known as: ZYLOPRIM  Take 100 mg by mouth daily.   aspirin  81 MG chewable tablet Chew 81 mg by mouth daily.   benazepril  5 MG tablet Commonly known as: LOTENSIN  Take 5 mg by mouth daily.   Calcium Carb-Cholecalciferol 500-400 MG-UNIT Tabs Take 2 tablets by mouth  daily.   furosemide  20 MG tablet Commonly known as: LASIX  Take 20 mg by mouth 2 (two) times a week. Monday & Thursday.   gabapentin  400 MG capsule Commonly known as: NEURONTIN  Take 1 capsule (400 mg total) by mouth at bedtime.   levothyroxine  100 MCG tablet Commonly known as: SYNTHROID  Take 100 mcg by mouth once a week. On Wednesday   levothyroxine  50 MCG tablet Commonly known as: SYNTHROID  Take 50 mcg by mouth daily before breakfast. Sun,Mon,Tue, Thur,Fri,Sat   memantine  10 MG tablet Commonly known as: NAMENDA  Take 5 mg by mouth 2 (  two) times daily.   potassium chloride  20 MEQ packet Commonly known as: KLOR-CON  Take 20 mEq by mouth 2 (two) times a week. Monday and Thursday   Vitamin D  50 MCG (2000 UT) Caps Take 2,000 Units by mouth daily.        Review of Systems  Constitutional:  Negative for activity change, diaphoresis and fever.  HENT:  Positive for hearing loss. Negative for congestion.   Eyes:  Negative for visual disturbance.  Respiratory:  Negative for cough, shortness of breath and wheezing.        DOE   Cardiovascular:  Negative for leg swelling.  Gastrointestinal:  Positive for anal bleeding. Negative for abdominal pain, constipation, diarrhea, nausea and vomiting.  Genitourinary:  Negative for dysuria and urgency.  Musculoskeletal:  Positive for arthralgias, back pain and gait problem.       Chronic, aches.  Baseline walker with walker slowly with SBA  Skin:  Negative for color change.  Neurological:  Negative for facial asymmetry, speech difficulty and weakness.  Psychiatric/Behavioral:  Positive for confusion. Negative for sleep disturbance. The patient is not nervous/anxious.     Immunization History  Administered Date(s) Administered  . Fluad Quad(high Dose 65+) 01/01/2022  . Influenza, High Dose Seasonal PF 12/11/2018, 01/01/2022, 12/10/2022  . Influenza-Unspecified 03/24/2018, 12/21/2019, 12/26/2020  . Moderna Covid-19 Vaccine  Bivalent  Booster 15yrs & up 01/09/2022, 12/24/2022  . Moderna SARS-COV2 Booster Vaccination 02/20/2021, 07/26/2021  . Moderna Sars-Covid-2 Vaccination 03/12/2019, 04/09/2019, 04/21/2019, 01/17/2020, 08/07/2020  . Pneumococcal Conjugate-13 09/09/2012, 10/28/2013  . Pneumococcal Polysaccharide-23 04/02/2009, 04/16/2009  . Tdap 07/15/2019  . Zoster Recombinant(Shingrix) 04/16/2009, 06/20/2022  . Zoster, Live 04/16/2009  . Zoster, Unspecified 04/16/2009   Pertinent  Health Maintenance Due  Topic Date Due  . INFLUENZA VACCINE  10/09/2023  . DEXA SCAN  Discontinued      01/07/2022    9:04 AM 02/06/2022   10:48 AM 04/21/2022    3:50 PM 06/12/2022   10:12 AM 06/12/2022   12:58 PM  Fall Risk  Falls in the past year? 0 0 0 0 1  Was there an injury with Fall? 0 0 0 0 0  Fall Risk Category Calculator 0 0 0 0 2  Fall Risk Category (Retired) Low  Low      (RETIRED) Patient Fall Risk Level Moderate fall risk  Moderate fall risk      Patient at Risk for Falls Due to History of fall(s) History of fall(s) No Fall Risks No Fall Risks History of fall(s);Impaired balance/gait;Impaired mobility  Fall risk Follow up Falls evaluation completed  Falls evaluation completed  Falls evaluation completed Falls evaluation completed Falls evaluation completed;Education provided;Falls prevention discussed;Follow up appointment     Data saved with a previous flowsheet row definition   Functional Status Survey:    Vitals:   10/12/23 1209  BP: 137/69  Pulse: 72  Resp: 16  Temp: (!) 97.5 F (36.4 C)  SpO2: 96%  Weight: 159 lb 12.8 oz (72.5 kg)   Body mass index is 26.59 kg/m. Physical Exam Vitals and nursing note reviewed.  Constitutional:      Comments: Appears tired  HENT:     Head: Normocephalic and atraumatic.     Nose: Nose normal.     Mouth/Throat:     Mouth: Mucous membranes are moist.  Eyes:     Extraocular Movements: Extraocular movements intact.     Conjunctiva/sclera: Conjunctivae normal.      Pupils: Pupils are equal, round, and reactive to light.  Cardiovascular:     Rate and Rhythm: Normal rate and regular rhythm.     Heart sounds: No murmur heard.    Comments: Weak dorsalis pedis pulses R+L from previous examination.  Pulmonary:     Effort: Pulmonary effort is normal.     Breath sounds: Rales present.     Comments: Bibasilar rales.  Abdominal:     General: Bowel sounds are normal.     Palpations: Abdomen is soft.     Tenderness: There is no abdominal tenderness.  Genitourinary:    Comments: Multiple external hemorrhoids, injured @ 4-5pm  Musculoskeletal:        General: No tenderness.     Cervical back: Normal range of motion and neck supple.     Right lower leg: No edema.     Left lower leg: No edema.  Skin:    General: Skin is warm and dry.  Neurological:     General: No focal deficit present.     Mental Status: She is alert. Mental status is at baseline.     Gait: Gait abnormal.     Comments: Ambulates with walker is her baseline Hx of resolved Right facial, RUE, RLE weakness  Psychiatric:        Mood and Affect: Mood normal.        Behavior: Behavior normal.     Labs reviewed: Recent Labs    02/24/23 0000 04/22/23 0000 07/27/23 1401  NA 140 137 141  K 4.1 4.0 3.6  CL 108 103 104  CO2 23* 24* 27  GLUCOSE  --   --  96  BUN 31* 21 18  CREATININE 1.4* 1.3* 1.48*  CALCIUM 8.8 8.8 9.1   Recent Labs    02/12/23 0000 07/27/23 1401  AST 17 17  ALT 14 11  ALKPHOS 98 69  BILITOT  --  0.5  PROT  --  6.4*  ALBUMIN 4.0 3.2*   Recent Labs    02/10/23 0000 04/22/23 0000 07/27/23 1401  WBC 8.8 9.3 9.8  NEUTROABS 6,961.00 7,933.00 7.8*  HGB 13.0 1.8* 12.3  HCT 39 36 39.2  MCV  --   --  102.1*  PLT 205 186 202   Lab Results  Component Value Date   TSH 2.48 05/19/2023   No results found for: HGBA1C No results found for: CHOL, HDL, LDLCALC, LDLDIRECT, TRIG, CHOLHDL  Significant Diagnostic Results in last 30 days:  No results  found.  Assessment/Plan  Hemorrhoids bright red blood noted after a BM, no further anal/rectal bleed, denied abd pain/discomfort, nausea, vomiting.  Avoid constipation Apply to 0.5% hydrocortisone cream twice daily to anal/hemorrhoids  GERD (gastroesophageal reflux disease)  stable, on Omeprazole, Hgb 13 10/05/23  Congestive heart failure (CHF) (HCC) 09/30/21 CXR mild congestive heart failure, CXR 10/25/21 showed CHF. Takes Furosemide  20mg  x2/wk, compensated clinically. Echo  EF 60%, DOE/cough on and off. BNP 24 02/17/23  Peripheral neuropathy taking Gabapentin , Vit B12 Gait abnormality, uses walker to ambulate slowly.     History of TIAs  Allergic to statin. On ASA.  HPOA desired no hospital evaluation in the past. ED 07/27/23 CT head/MR brain no acute intracranial changes.   Seizure-like activity (HCC) hx of, likely post fall, off Tramadol , 07/27/23 ED neurology: increase Gabapentin  400mg  every day, f/u neurology out patient-HPOA desires to delay it.   Generalized osteoarthritis of multiple sites T5, T8, L3 compression fxs. Takes Tylenol  and Gabapentin  for pain, off Tramadol , DDD per CT head/cervical spine 08/02/20, Vit D 61 05/19/23  History of CVA (cerebrovascular accident) Hx of CVA per CT head 08/02/20 Remote infarcts within the a occipital cortices bilaterally again noted. Interval development of remote infarcts within the left parietal lobe and periventricular white matte. On ASA.  07/27/23. CT head/MR brain:   HTN (hypertension) loose blood pressure control, on Benazepril   CKD (chronic kidney disease) stage 3, GFR 30-59 ml/min (HCC) Bun/creat 33/1.4 10/05/23   Family/ staff Communication: Plan of care reviewed with the patient and charge nurse  Labs/tests ordered: None

## 2023-10-12 NOTE — Assessment & Plan Note (Signed)
 hx of, likely post fall, off Tramadol , 07/27/23 ED neurology: increase Gabapentin  400mg  every day, f/u neurology out patient-HPOA desires to delay it.

## 2023-10-12 NOTE — Assessment & Plan Note (Signed)
 T5, T8, L3 compression fxs. Takes Tylenol  and Gabapentin  for pain, off Tramadol , DDD per CT head/cervical spine 08/02/20, Vit D 61 05/19/23

## 2023-10-12 NOTE — Assessment & Plan Note (Signed)
 loose blood pressure control,  on Benazepril 

## 2023-10-12 NOTE — Assessment & Plan Note (Signed)
 on Levothyroxine, TSH 2.48 05/19/23

## 2023-10-12 NOTE — Assessment & Plan Note (Signed)
 09/30/21 CXR mild congestive heart failure, CXR 10/25/21 showed CHF. Takes Furosemide  20mg  x2/wk, compensated clinically. Echo EF 60%, DOE/cough on and off. BNP 24 02/17/23

## 2023-10-12 NOTE — Assessment & Plan Note (Signed)
 stable, on Omeprazole, Hgb 13 10/05/23

## 2023-10-12 NOTE — Assessment & Plan Note (Signed)
 taking Gabapentin , Vit B12 Gait abnormality, uses walker to ambulate slowly.

## 2023-10-12 NOTE — Assessment & Plan Note (Signed)
 bright red blood noted after a BM, no further anal/rectal bleed, denied abd pain/discomfort, nausea, vomiting.  Avoid constipation Apply to 0.5% hydrocortisone cream twice daily to anal/hemorrhoids

## 2023-11-16 ENCOUNTER — Non-Acute Institutional Stay (SKILLED_NURSING_FACILITY): Payer: Self-pay | Admitting: Sports Medicine

## 2023-11-16 DIAGNOSIS — E039 Hypothyroidism, unspecified: Secondary | ICD-10-CM

## 2023-11-16 DIAGNOSIS — G609 Hereditary and idiopathic neuropathy, unspecified: Secondary | ICD-10-CM | POA: Diagnosis not present

## 2023-11-16 DIAGNOSIS — I509 Heart failure, unspecified: Secondary | ICD-10-CM | POA: Diagnosis not present

## 2023-11-16 DIAGNOSIS — F0390 Unspecified dementia without behavioral disturbance: Secondary | ICD-10-CM | POA: Diagnosis not present

## 2023-11-16 DIAGNOSIS — I10 Essential (primary) hypertension: Secondary | ICD-10-CM

## 2023-11-16 NOTE — Progress Notes (Unsigned)
 Provider:  Dr. Jackalyn Blazing Location:  [facility] Place of Service:   Skilled care Nursing   PCP: Blazing Jackalyn, MD Patient Care Team: Blazing Jackalyn, MD as PCP - General (Internal Medicine) Mast, Man X, NP as Nurse Practitioner (Internal Medicine) Charlanne Fredia CROME, MD as Consulting Physician (Internal Medicine) Charlanne Fredia CROME, MD (Internal Medicine)  Extended Emergency Contact Information Primary Emergency Contact: [facility] Phone: [phone] Relation: Relative Secondary Emergency Contact: Enoch,[name]  [place] Home Phone: [phone] Relation: Son  Goals of Care: Advanced Directive information    09/15/2023   10:59 AM  Advanced Directives  Does Patient Have a Medical Advance Directive? Yes  Type of Estate agent of [place];Out of facility DNR (pink MOST or yellow form)  Does patient want to make changes to medical advance directive? No - Patient declined  Copy of Healthcare Power of Attorney in Chart? Yes - validated most recent copy scanned in chart (See row information)  Pre-existing out of facility DNR order (yellow form or pink MOST form) Pink MOST form placed in chart (order not valid for inpatient use)      No chief complaint on file.      History of Present Illness 89 yr old F with PMH of dementia, GERD, CHF, peripheral neuropathy, Seizure, OA ,CVA is evaluated for chronic disease management Pt seen and examined in the living room  She is sitting in the chair watching TV  Seems pleasant and comfortable and does not appear to be in distress Pt ambulates with walker As per staff no agitation or concerns Pt is able to feed herself She knows her name, but not oriented to time or place     11/09/2023 13:13 162.8 Lbs (Standing) 11/07/2023 09:18 159.8 Lbs (Standing) 10/09/2023 13:07 159.8 Lbs (Standing) 09/08/2023 12:31 160.5 Lbs (Standing) 08/09/2023 13:04 157.9 Lbs  (Standing) 07/09/2023 12:49 161.6 Lbs (Standing) 06/09/2023 14:08 162.2 Lbs (Standing) 05/09/2023 12:49 160.7 Lbs (Standing) 04/11/2023 11:52 164.6 Lbs (Wheelchair) 03/11/2023 14:35 169.3 Lbs (Standing    Past Medical History:  Diagnosis Date   Cognitive changes    Dementia (HCC)    Depression    Gout    Hypertension    Hypothyroidism    Peripheral neuropathy    No past surgical history on file.  reports that she has an unknown smoking status. She has never used smokeless tobacco. She reports that she does not drink alcohol and does not use drugs. Social History   Socioeconomic History   Marital status: Widowed    Spouse name: Not on file   Number of children: Not on file   Years of education: Not on file   Highest education level: Not on file  Occupational History   Not on file  Tobacco Use   Smoking status: Unknown   Smokeless tobacco: Never  Vaping Use   Vaping status: Never Used  Substance and Sexual Activity   Alcohol use: Never   Drug use: Never   Sexual activity: Not on file  Other Topics Concern   Not on file  Social History Narrative   ** Merged History Encounter **       Social Drivers of Health   Financial Resource Strain: Not on file  Food Insecurity: Not on file  Transportation Needs: Not on file  Physical Activity: Not on file  Stress: Not on file  Social Connections: Not on file  Intimate Partner Violence: Not on file    Functional Status Survey:  No family history on file.  Health Maintenance  Topic Date Due   Influenza Vaccine  10/09/2023   COVID-19 Vaccine (10 - 2025-26 season) 11/09/2023   Medicare Annual Wellness (AWV)  07/05/2024   DTaP/Tdap/Td (2 - Td or Tdap) 07/14/2029   Pneumococcal Vaccine: 50+ Years  Completed   Zoster Vaccines- Shingrix  Completed   HPV VACCINES  Aged Out   Meningococcal B Vaccine  Aged Out   DEXA SCAN  Discontinued    Allergies  Allergen Reactions   Actonel [Risedronate Sodium]    Actonel  [Risedronate] Other (See Comments)    Allergic, per [facility]   Hct [Hydrochlorothiazide]    Hydrochlorothiazide Other (See Comments)    Allergic, per [facility]   Lipitor [Atorvastatin Calcium]    Lipitor [Atorvastatin] Other (See Comments)    Allergic, per [facility]   Neomycin Other (See Comments)    Unknown been so long ago, a Dermatologist told me   Neomycin Other (See Comments)    Allergic, per [facility]   Polysporin [Bacitracin-Polymyxin B]    Polysporin [Bacitracin-Polymyxin B] Other (See Comments)    Allergic, per [facility]   Prolia  [Denosumab ]    Prolia  [Denosumab ] Other (See Comments)   Sulfa Antibiotics    Sulfa Antibiotics Other (See Comments)    Allergic, per [facility]   Zocor [Simvastatin]    Zocor [Simvastatin] Other (See Comments)    Allergic, per [facility]    Outpatient Encounter Medications as of 11/16/2023  Medication Sig   acetaminophen  (TYLENOL ) 325 MG tablet Take 650 mg by mouth in the morning, at noon, and at bedtime.   allopurinol  (ZYLOPRIM ) 100 MG tablet Take 100 mg by mouth daily.   aspirin  81 MG chewable tablet Chew 81 mg by mouth daily.   benazepril  (LOTENSIN ) 5 MG tablet Take 5 mg by mouth daily.   Calcium Carb-Cholecalciferol 500-400 MG-UNIT TABS Take 2 tablets by mouth daily.    Cholecalciferol (VITAMIN D ) 50 MCG (2000 UT) CAPS Take 2,000 Units by mouth daily.   furosemide  (LASIX ) 20 MG tablet Take 20 mg by mouth 2 (two) times a week. Monday & Thursday.   gabapentin  (NEURONTIN ) 400 MG capsule Take 1 capsule (400 mg total) by mouth at bedtime.   levothyroxine  (SYNTHROID ) 100 MCG tablet Take 100 mcg by mouth once a week. On Wednesday   levothyroxine  (SYNTHROID ) 50 MCG tablet Take 50 mcg by mouth daily before breakfast. Sun,Mon,Tue, Thur,Fri,Sat   memantine  (NAMENDA ) 10 MG tablet Take 5 mg by mouth 2 (two) times daily.   potassium chloride  (KLOR-CON ) 20 MEQ packet Take 20 mEq by mouth 2 (two) times a week. Monday and Thursday   No facility-administered encounter medications on  file as of 11/16/2023.    Review of Systems  Constitutional:  Negative for fever.  Respiratory:  Negative for cough and shortness of breath.   Cardiovascular:  Negative for chest pain and leg swelling.  Gastrointestinal:  Negative for abdominal pain, blood in stool, nausea and vomiting.  Genitourinary:  Negative for dysuria.  Neurological:  Negative for dizziness.  Psychiatric/Behavioral:  Negative for confusion.    Negative unless indicated in HPI.  There were no vitals filed for this visit. There is no height or weight on file to calculate BMI. BP Readings from Last 3 Encounters:  10/12/23 137/69  10/05/23 (!) 171/82  09/15/23 124/80   Wt Readings from Last 3 Encounters:  10/12/23 159 lb 12.8 oz (72.5 kg)  10/05/23 160 lb 8 oz (72.8 kg)  09/15/23 160 lb 8 oz (  72.8 kg)   Physical Exam Constitutional:      Appearance: Normal appearance.  HENT:     Head: Normocephalic and atraumatic.  Cardiovascular:     Rate and Rhythm: Normal rate and regular rhythm.  Pulmonary:     Effort: Pulmonary effort is normal. No respiratory distress.     Breath sounds: Normal breath sounds. No wheezing.  Abdominal:     General: Bowel sounds are normal. There is no distension.     Tenderness: There is no abdominal tenderness. There is no guarding or rebound.     Comments:    Musculoskeletal:        General: No swelling.  Neurological:     Mental Status: She is alert. Mental status is at baseline.     Motor: No weakness.     Labs reviewed: Basic Metabolic Panel: Recent Labs    02/24/23 0000 04/22/23 0000 07/27/23 1401  NA 140 137 141  K 4.1 4.0 3.6  CL 108 103 104  CO2 23* 24* 27  GLUCOSE  --   --  96  BUN 31* 21 18  CREATININE 1.4* 1.3* 1.48*  CALCIUM 8.8 8.8 9.1   Liver Function Tests: Recent Labs    02/12/23 0000 07/27/23 1401  AST 17 17  ALT 14 11  ALKPHOS 98 69  BILITOT  --  0.5  PROT  --  6.4*  ALBUMIN 4.0 3.2*   No results for input(s): LIPASE, AMYLASE in  the last 8760 hours. No results for input(s): AMMONIA in the last 8760 hours. CBC: Recent Labs    02/10/23 0000 04/22/23 0000 07/27/23 1401  WBC 8.8 9.3 9.8  NEUTROABS 6,961.00 7,933.00 7.8*  HGB 13.0 1.8* 12.3  HCT 39 36 39.2  MCV  --   --  102.1*  PLT 205 186 202   Cardiac Enzymes: No results for input(s): CKTOTAL, CKMB, CKMBINDEX, TROPONINI in the last 8760 hours. BNP: Invalid input(s): POCBNP No results found for: HGBA1C Lab Results  Component Value Date   TSH 2.48 05/19/2023   No results found for: VITAMINB12 No results found for: FOLATE No results found for: IRON, TIBC, FERRITIN  Imaging and Procedures obtained prior to SNF admission: MR BRAIN WO CONTRAST Result Date: 07/27/2023 CLINICAL DATA:  Follow-up examination for stroke. EXAM: MRI HEAD WITHOUT CONTRAST TECHNIQUE: Multiplanar, multiecho pulse sequences of the brain and surrounding structures were obtained without intravenous contrast. COMPARISON:  CT from earlier the same day. FINDINGS: Brain: Examination somewhat degraded by motion artifact. Cerebral volume within normal limits. Patchy T2/FLAIR hyperintensity involving the periventricular deep white matter both cerebral hemispheres, consistent with chronic small vessel ischemic disease, mild for age. Few scattered remote cortical infarcts noted about the occipital lobes bilaterally. Remote left parietal infarct noted. Mild chronic hemosiderin staining noted about these areas of chronic ischemia. No abnormal foci of restricted diffusion to suggest acute or subacute ischemia. Gray-white matter differentiation otherwise maintained. No acute intracranial hemorrhage. Single punctate chronic microhemorrhage noted within the right cerebellum. No mass lesion, midline shift or mass effect. Mild ventricular prominence related to underlying atrophy without hydrocephalus. No extra-axial fluid collection. Pituitary gland within normal limits. Vascular: Major  intracranial vascular flow voids are maintained. Skull and upper cervical spine: Craniocervical junction within normal limits. Bone marrow signal intensity normal. No scalp soft tissue abnormality. Sinuses/Orbits: Prior bilateral ocular lens replacement. Paranasal sinuses are largely clear. No significant mastoid effusion. Other: None. IMPRESSION: 1. No acute intracranial abnormality. 2. Mild chronic microvascular ischemic disease with a few  scattered remote infarcts involving the left parietal and bilateral occipital lobes. Electronically Signed   By: Morene Hoard M.D.   On: 07/27/2023 22:46   CT Head Wo Contrast Result Date: 07/27/2023 CLINICAL DATA:  Altered mental status, previous strokes EXAM: CT HEAD WITHOUT CONTRAST TECHNIQUE: Contiguous axial images were obtained from the base of the skull through the vertex without intravenous contrast. RADIATION DOSE REDUCTION: This exam was performed according to the departmental dose-optimization program which includes automated exposure control, adjustment of the mA and/or kV according to patient size and/or use of iterative reconstruction technique. COMPARISON:  11/24/2020 FINDINGS: Brain: Stable atrophy pattern and chronic white matter microvascular ischemic change throughout the periventricular white matter. Similar remote areas of infarct in the left parietal lobe and both occipital lobes. No acute intracranial hemorrhage, new mass lesion, new infarction, midline shift, herniation, hydrocephalus, extra-axial fluid collection. Stable mild ventricular enlargement. Cisterns are patent. Mild cerebellar atrophy as well. Vascular: No hyperdense vessel or unexpected calcification. Skull: Normal. Negative for fracture or focal lesion. Sinuses/Orbits: No acute finding. Other: None. IMPRESSION: 1. Stable CT head. No acute intracranial abnormality by noncontrast CT. Electronically Signed   By: CHRISTELLA.  Shick M.D.   On: 07/27/2023 17:00    Assessment and  Plan Assessment & Plan  Major Neurocognitive disorder No agitation per nursing staff Cont with namenda  5 mg twice daily  Increase cognitively engaging activities and physical activity   HTN  Cont with benazepril   Avoid salty foods  Neuropathy  Cont with gabapentin    H/o CHF Euvolemic on exam today  Cont with lasix    Hypothyroidism  Cont with synthyroid .       Family/ staff Communication:   Labs/tests ordered:  Jackalyn Blazing

## 2023-11-17 ENCOUNTER — Encounter: Payer: Self-pay | Admitting: Sports Medicine

## 2023-11-19 ENCOUNTER — Encounter: Payer: Self-pay | Admitting: Sports Medicine

## 2023-11-19 NOTE — Progress Notes (Signed)
 This encounter was created in error - please disregard.

## 2023-12-11 ENCOUNTER — Non-Acute Institutional Stay (SKILLED_NURSING_FACILITY): Payer: Self-pay | Admitting: Nurse Practitioner

## 2023-12-11 ENCOUNTER — Encounter: Payer: Self-pay | Admitting: Nurse Practitioner

## 2023-12-11 DIAGNOSIS — Z8673 Personal history of transient ischemic attack (TIA), and cerebral infarction without residual deficits: Secondary | ICD-10-CM

## 2023-12-11 DIAGNOSIS — N1831 Chronic kidney disease, stage 3a: Secondary | ICD-10-CM | POA: Diagnosis not present

## 2023-12-11 DIAGNOSIS — M109 Gout, unspecified: Secondary | ICD-10-CM | POA: Diagnosis not present

## 2023-12-11 DIAGNOSIS — G609 Hereditary and idiopathic neuropathy, unspecified: Secondary | ICD-10-CM

## 2023-12-11 DIAGNOSIS — E039 Hypothyroidism, unspecified: Secondary | ICD-10-CM

## 2023-12-11 DIAGNOSIS — R569 Unspecified convulsions: Secondary | ICD-10-CM

## 2023-12-11 DIAGNOSIS — R2681 Unsteadiness on feet: Secondary | ICD-10-CM

## 2023-12-11 DIAGNOSIS — I509 Heart failure, unspecified: Secondary | ICD-10-CM

## 2023-12-11 DIAGNOSIS — F0390 Unspecified dementia without behavioral disturbance: Secondary | ICD-10-CM

## 2023-12-11 DIAGNOSIS — I10 Essential (primary) hypertension: Secondary | ICD-10-CM | POA: Diagnosis not present

## 2023-12-11 NOTE — Assessment & Plan Note (Signed)
 MMSE 17/30 4/14 2024 No behavioral issues Continue memantine 

## 2023-12-11 NOTE — Assessment & Plan Note (Signed)
 loose blood pressure control,  on Benazepril 

## 2023-12-11 NOTE — Assessment & Plan Note (Signed)
 Seizure like activity, hx of, likely post fall, off Tramadol , 07/27/23 ED neurology: increase Gabapentin  400mg  every day, f/u neurology out patient-HPOA desires to delay it.

## 2023-12-11 NOTE — Assessment & Plan Note (Signed)
 Bun/creat 33/1.4 10/05/23

## 2023-12-11 NOTE — Assessment & Plan Note (Signed)
 stable, on Omeprazole, Hgb 13 10/05/23

## 2023-12-11 NOTE — Assessment & Plan Note (Signed)
Ambulates with walker slowly.

## 2023-12-11 NOTE — Assessment & Plan Note (Signed)
 History of TIA and stroke, no focal weakness residuals

## 2023-12-11 NOTE — Progress Notes (Signed)
 Location:   [facility] Room Number: 80 Place of Service:  SNF (31) Provider: Larwance Pahola Dimmitt NP  Sherlynn Madden, MD  Patient Care Team: Sherlynn Madden, MD as PCP - General (Internal Medicine) Bellarose Burtt X, NP as Nurse Practitioner (Internal Medicine) Charlanne Fredia CROME, MD as Consulting Physician (Internal Medicine) Charlanne Fredia CROME, MD (Internal Medicine)  Extended Emergency Contact Information Primary Emergency Contact: [facility] Phone: [phone] Relation: Relative Secondary Emergency Contact: Nater,[name]  [place] Home Phone: [phone] Relation: Son  Code Status:  DNR Goals of care: Advanced Directive information    11/19/2023   11:24 AM  Advanced Directives  Does Patient Have a Medical Advance Directive? Yes  Type of Advance Directive Out of facility DNR (pink MOST or yellow form)  Does patient want to make changes to medical advance directive? No - Patient declined  Pre-existing out of facility DNR order (yellow form or pink MOST form) Yellow form placed in chart (order not valid for inpatient use);Pink MOST/Yellow Form most recent copy in chart - Physician notified to receive inpatient order     Chief Complaint  Patient presents with   Medical Management of Chronic Issues    HPI:  Pt is a 89 y.o. female seen today for medical management of chronic diseases.    Hemorrhoids, bright red blood noted after a BM, no further anal/rectal bleed, denied abd pain/discomfort, nausea, vomiting.              GERD, stable, on Omeprazole, Hgb 13 10/05/23 CHF, 09/30/21 CXR mild congestive heart failure, CXR 10/25/21 showed CHF. Takes Furosemide  20mg  x2/wk, compensated clinically. Echo  EF 60%, DOE/cough on and off. BNP 24 02/17/23             Peripheral neuropathy, taking Gabapentin , Vit B12 Gait abnormality, uses walker to ambulate slowly.       Hx of TIA. Allergic to statin. On ASA.  HPOA desired no hospital evaluation in the past. ED  07/27/23 CT head/MR brain no acute intracranial changes.  Seizure like activity, hx of, likely post fall, off Tramadol , 07/27/23 ED neurology: increase Gabapentin  400mg  every day, f/u neurology out patient-HPOA desires to delay it.  OA: T5, T8, L3 compression fxs. Takes Tylenol  and Gabapentin  for pain, off Tramadol , DDD per CT head/cervical spine 08/02/20, Vit D 61 05/19/23             Hx of CVA per CT head 08/02/20 Remote infarcts within the a occipital cortices bilaterally again noted. Interval development of remote infarcts within the left parietal lobe and periventricular white matte. On ASA.  07/27/23. CT head/MR brain: No acute intracranial abnormality             Dementia, 07/15/19 CT head Mild generalized parenchymal atrophy and chronic small vessel ischemic disease, 07/27/23. CT head/MR brain: No acute intracranial abnormality. Mild chronic microvascular ischemic disease with a few scattered remote infarcts involving the left parietal and bilateral occipital Lobes, tolerated Memantine  well. 06/22/22 MMSE 17/30             HTN, loose blood pressure control, on Benazepril              CKD Bun/creat 33/1.4 10/05/23             Gout, stable, on Allopurinol                    Hypothyroidism, on Levothyroxine , TSH 2.48 05/19/23  Abnormal CXR, suggested CT chest, not candidate for workup    7 Past Medical History:  Diagnosis Date   Cognitive changes    Dementia (HCC)    Depression    Gout    Hypertension    Hypothyroidism    Peripheral neuropathy    History reviewed. No pertinent surgical history.  Allergies  Allergen Reactions   Actonel [Risedronate Sodium]    Actonel [Risedronate] Other (See Comments)    Allergic, per [facility]   Hct [Hydrochlorothiazide]    Hydrochlorothiazide Other (See Comments)    Allergic, per [facility]   Lipitor [Atorvastatin Calcium]    Lipitor [Atorvastatin] Other (See Comments)    Allergic, per [facility]   Neomycin Other (See Comments)    Unknown been so long  ago, a Dermatologist told me   Neomycin Other (See Comments)    Allergic, per [facility]   Polysporin [Bacitracin-Polymyxin B]    Polysporin [Bacitracin-Polymyxin B] Other (See Comments)    Allergic, per [facility]   Prolia  [Denosumab ]    Prolia  [Denosumab ] Other (See Comments)   Sulfa Antibiotics    Sulfa Antibiotics Other (See Comments)    Allergic, per [facility]   Zocor [Simvastatin]    Zocor [Simvastatin] Other (See Comments)    Allergic, per [facility]    Allergies as of 12/11/2023       Reactions   Actonel [risedronate Sodium]    Actonel [risedronate] Other (See Comments)   Allergic, per [facility]   Hct [hydrochlorothiazide]    Hydrochlorothiazide Other (See Comments)   Allergic, per [facility]   Lipitor [atorvastatin Calcium]    Lipitor [atorvastatin] Other (See Comments)   Allergic, per [facility]   Neomycin Other (See Comments)   Unknown been so long ago, a Dermatologist told me   Neomycin Other (See Comments)   Allergic, per [facility]   Polysporin [bacitracin-polymyxin B]    Polysporin [bacitracin-polymyxin B] Other (See Comments)   Allergic, per [facility]   Prolia  [denosumab ]    Prolia  [denosumab ] Other (See Comments)   Sulfa Antibiotics    Sulfa Antibiotics Other (See Comments)   Allergic, per [facility]   Zocor [simvastatin]    Zocor [simvastatin] Other (See Comments)   Allergic, per [facility]        Medication List        Accurate as of December 11, 2023 11:59 PM. If you have any questions, ask your nurse or doctor.          acetaminophen  325 MG tablet Commonly known as: TYLENOL  Take 650 mg by mouth in the morning, at noon, and at bedtime.   allopurinol  100 MG tablet Commonly known as: ZYLOPRIM  Take 100 mg by mouth daily.   aspirin  81 MG chewable tablet Chew 81 mg by mouth daily.   benazepril  5 MG tablet Commonly known as: LOTENSIN  Take 5 mg by mouth daily.   Calcium Carb-Cholecalciferol 500-400 MG-UNIT Tabs Take 2 tablets by mouth daily.   docusate sodium 100 MG  capsule Commonly known as: COLACE Take 100 mg by mouth 2 (two) times daily. Give 1 tablet by mouth two times a day for constipation   furosemide  20 MG tablet Commonly known as: LASIX  Take 20 mg by mouth 2 (two) times a week. Monday & Thursday.   gabapentin  400 MG capsule Commonly known as: NEURONTIN  Take 1 capsule (400 mg total) by mouth at bedtime.   hydrocortisone 2.5 % rectal cream Commonly known as: ANUSOL-HC Place 1 Application rectally 2 (two) times daily. Apply to Anus topically two times a day  for Hemorrhoids   levothyroxine  100 MCG tablet Commonly known as: SYNTHROID  Take 100 mcg by mouth once a week. On Wednesday   levothyroxine  50 MCG tablet Commonly known as: SYNTHROID  Take 50 mcg by mouth daily before breakfast. Sun,Mon,Tue, Thur,Fri,Sat   memantine  10 MG tablet Commonly known as: NAMENDA  Take 5 mg by mouth 2 (two) times daily.   omeprazole 20 MG capsule Commonly known as: PRILOSEC Take 20 mg by mouth daily. Give 1 tablet by mouth one time a day for nausea/vomit Give it on an empty stomach.Donot crush   potassium chloride  20 MEQ packet Commonly known as: KLOR-CON  Take 20 mEq by mouth 2 (two) times a week. Monday and Thursday   Vitamin D  50 MCG (2000 UT) Caps Take 2,000 Units by mouth daily.        Review of Systems  Constitutional:  Negative for activity change, diaphoresis and fever.  HENT:  Positive for hearing loss. Negative for congestion.   Eyes:  Negative for visual disturbance.  Respiratory:  Negative for cough, shortness of breath and wheezing.        DOE   Cardiovascular:  Negative for leg swelling.  Gastrointestinal:  Negative for abdominal pain and constipation.  Genitourinary:  Negative for dysuria and urgency.  Musculoskeletal:  Positive for arthralgias, back pain and gait problem.       Chronic, aches.  Baseline walker with walker slowly with SBA  Skin:  Negative for color change.  Neurological:  Negative for facial asymmetry,  speech difficulty and weakness.  Psychiatric/Behavioral:  Positive for confusion. Negative for sleep disturbance. The patient is not nervous/anxious.     Immunization History  Administered Date(s) Administered   Fluad Quad(high Dose 65+) 01/01/2022   INFLUENZA, HIGH DOSE SEASONAL PF 12/11/2018, 01/01/2022, 12/10/2022   Influenza-Unspecified 03/24/2018, 12/21/2019, 12/26/2020   Moderna Covid-19 Vaccine  Bivalent Booster 40yrs & up 01/09/2022, 12/24/2022   Moderna SARS-COV2 Booster Vaccination 02/20/2021, 07/26/2021   Moderna Sars-Covid-2 Vaccination 03/12/2019, 04/09/2019, 04/21/2019, 01/17/2020, 08/07/2020   Pneumococcal Conjugate-13 09/09/2012, 10/28/2013   Pneumococcal Polysaccharide-23 04/02/2009, 04/16/2009   Tdap 07/15/2019   Zoster Recombinant(Shingrix) 04/16/2009, 06/20/2022   Zoster, Live 04/16/2009   Zoster, Unspecified 04/16/2009   Pertinent  Health Maintenance Due  Topic Date Due   Influenza Vaccine  10/09/2023   DEXA SCAN  Discontinued      01/07/2022    9:04 AM 02/06/2022   10:48 AM 04/21/2022    3:50 PM 06/12/2022   10:12 AM 06/12/2022   12:58 PM  Fall Risk  Falls in the past year? 0 0 0 0 1  Was there an injury with Fall? 0 0 0 0 0  Fall Risk Category Calculator 0 0 0 0 2  Fall Risk Category (Retired) Low  Low      (RETIRED) Patient Fall Risk Level Moderate fall risk  Moderate fall risk      Patient at Risk for Falls Due to History of fall(s) History of fall(s) No Fall Risks No Fall Risks History of fall(s);Impaired balance/gait;Impaired mobility  Fall risk Follow up Falls evaluation completed  Falls evaluation completed  Falls evaluation completed Falls evaluation completed Falls evaluation completed;Education provided;Falls prevention discussed;Follow up appointment     Data saved with a previous flowsheet row definition   Functional Status Survey:    Vitals:   12/11/23 1505 12/14/23 1203  BP: (!) 154/68 (!) 142/80  Pulse: 78   Resp: 18   Temp: (!) 97.2 F  (36.2 C)   SpO2: 95%   Weight:  163 lb 1.6 oz (74 kg)    Body mass index is 27.14 kg/m. Physical Exam Vitals and nursing note reviewed.  Constitutional:      Comments: Appears tired  HENT:     Head: Normocephalic and atraumatic.     Nose: Nose normal.     Mouth/Throat:     Mouth: Mucous membranes are moist.  Eyes:     Extraocular Movements: Extraocular movements intact.     Conjunctiva/sclera: Conjunctivae normal.     Pupils: Pupils are equal, round, and reactive to light.  Cardiovascular:     Rate and Rhythm: Normal rate and regular rhythm.     Heart sounds: No murmur heard.    Comments: Weak dorsalis pedis pulses R+L from previous examination.  Pulmonary:     Effort: Pulmonary effort is normal.     Breath sounds: Rales present.     Comments: Bibasilar rales.  Abdominal:     General: Bowel sounds are normal.     Palpations: Abdomen is soft.     Tenderness: There is no abdominal tenderness.  Genitourinary:    Comments: Multiple external hemorrhoids from previous examination Musculoskeletal:        General: No tenderness.     Cervical back: Normal range of motion and neck supple.     Right lower leg: No edema.     Left lower leg: No edema.  Skin:    General: Skin is warm and dry.  Neurological:     General: No focal deficit present.     Mental Status: She is alert. Mental status is at baseline.     Gait: Gait abnormal.     Comments: Ambulates with walker is her baseline Hx of resolved Right facial, RUE, RLE weakness  Psychiatric:        Mood and Affect: Mood normal.        Behavior: Behavior normal.     Labs reviewed: Recent Labs    02/24/23 0000 04/22/23 0000 07/27/23 1401  NA 140 137 141  K 4.1 4.0 3.6  CL 108 103 104  CO2 23* 24* 27  GLUCOSE  --   --  96  BUN 31* 21 18  CREATININE 1.4* 1.3* 1.48*  CALCIUM 8.8 8.8 9.1   Recent Labs    02/12/23 0000 07/27/23 1401  AST 17 17  ALT 14 11  ALKPHOS 98 69  BILITOT  --  0.5  PROT  --  6.4*  ALBUMIN  4.0 3.2*   Recent Labs    02/10/23 0000 04/22/23 0000 07/27/23 1401  WBC 8.8 9.3 9.8  NEUTROABS 6,961.00 7,933.00 7.8*  HGB 13.0 1.8* 12.3  HCT 39 36 39.2  MCV  --   --  102.1*  PLT 205 186 202   Lab Results  Component Value Date   TSH 2.48 05/19/2023   No results found for: HGBA1C No results found for: CHOL, HDL, LDLCALC, LDLDIRECT, TRIG, CHOLHDL  Significant Diagnostic Results in last 30 days:  No results found.  Assessment/Plan  Major neurocognitive disorder [facility]) MMSE 17/30 4/14 2024 No behavioral issues Continue memantine   HTN (hypertension)  loose blood pressure control, on Benazepril   CKD (chronic kidney disease) stage 3, GFR 30-59 ml/min (HCC) Bun/creat 33/1.4 10/05/23  Gout  stable, on Allopurinol         Hypothyroidism  on Levothyroxine , TSH 2.48 05/19/23  Seizure-like activity (HCC) Seizure like activity, hx of, likely post fall, off Tramadol , 07/27/23 ED neurology: increase Gabapentin  400mg  every day, f/u neurology out patient-HPOA  desires to delay it.   History of CVA (cerebrovascular accident) History of TIA and stroke, no focal weakness residuals  Gait instability Ambulates with walker slowly  Peripheral neuropathy Continue gabapentin , vitamin D  B12 supplement  Congestive heart failure (CHF) (HCC) Mild swelling in legs, 09/30/21 CXR mild congestive heart failure, CXR 10/25/21 showed CHF. Takes Furosemide  20mg  x2/wk, compensated clinically. Echo  EF 60%, DOE/cough on and off. BNP 24 02/17/23  GERD (gastroesophageal reflux disease) stable, on Omeprazole, Hgb 13 10/05/23   Family/ staff Communication: Plan of care reviewed with the patient and charge nurse  Labs/tests ordered: None

## 2023-12-11 NOTE — Assessment & Plan Note (Signed)
 Continue gabapentin , vitamin D  B12 supplement

## 2023-12-11 NOTE — Assessment & Plan Note (Signed)
 on Levothyroxine, TSH 2.48 05/19/23

## 2023-12-11 NOTE — Assessment & Plan Note (Signed)
 Mild swelling in legs, 09/30/21 CXR mild congestive heart failure, CXR 10/25/21 showed CHF. Takes Furosemide  20mg  x2/wk, compensated clinically. Echo  EF 60%, DOE/cough on and off. BNP 24 02/17/23

## 2023-12-11 NOTE — Assessment & Plan Note (Signed)
stable, on Allopurinol       

## 2024-01-19 ENCOUNTER — Encounter: Payer: Self-pay | Admitting: Nurse Practitioner

## 2024-01-19 ENCOUNTER — Non-Acute Institutional Stay (SKILLED_NURSING_FACILITY): Admitting: Nurse Practitioner

## 2024-01-19 DIAGNOSIS — R569 Unspecified convulsions: Secondary | ICD-10-CM

## 2024-01-19 DIAGNOSIS — E039 Hypothyroidism, unspecified: Secondary | ICD-10-CM | POA: Diagnosis not present

## 2024-01-19 DIAGNOSIS — F0390 Unspecified dementia without behavioral disturbance: Secondary | ICD-10-CM

## 2024-01-19 DIAGNOSIS — M109 Gout, unspecified: Secondary | ICD-10-CM

## 2024-01-19 DIAGNOSIS — I10 Essential (primary) hypertension: Secondary | ICD-10-CM

## 2024-01-19 DIAGNOSIS — K219 Gastro-esophageal reflux disease without esophagitis: Secondary | ICD-10-CM

## 2024-01-19 DIAGNOSIS — Z8673 Personal history of transient ischemic attack (TIA), and cerebral infarction without residual deficits: Secondary | ICD-10-CM

## 2024-01-19 DIAGNOSIS — G609 Hereditary and idiopathic neuropathy, unspecified: Secondary | ICD-10-CM

## 2024-01-19 DIAGNOSIS — N1831 Chronic kidney disease, stage 3a: Secondary | ICD-10-CM | POA: Diagnosis not present

## 2024-01-19 DIAGNOSIS — I509 Heart failure, unspecified: Secondary | ICD-10-CM

## 2024-01-19 NOTE — Assessment & Plan Note (Signed)
 Likely post fall Off tramadol  Increased gabapentin  during ED visit 07/27/2023 H POA delayed neurology follow-up

## 2024-01-19 NOTE — Assessment & Plan Note (Signed)
 Bun/creat 33/1.4 10/05/23

## 2024-01-19 NOTE — Assessment & Plan Note (Signed)
 Continue memantine , 06/22/22 MMSE 17/30 No behavioral issues, SNF for care needed

## 2024-01-19 NOTE — Assessment & Plan Note (Signed)
 on Levothyroxine, TSH 2.48 05/19/23

## 2024-01-19 NOTE — Assessment & Plan Note (Signed)
 loose blood pressure control,  on Benazepril 

## 2024-01-19 NOTE — Assessment & Plan Note (Signed)
taking Gabapentin, Vit B12

## 2024-01-19 NOTE — Assessment & Plan Note (Signed)
 History of 2 strokes, TIA Allergic to statin On ASA No focal weakness and residual, ambulates with walker

## 2024-01-19 NOTE — Assessment & Plan Note (Signed)
 Stable, continue omeprazole 

## 2024-01-19 NOTE — Assessment & Plan Note (Signed)
 Euvolemic Low dose of furosemide 

## 2024-01-19 NOTE — Progress Notes (Unsigned)
 Location:  [facility] Room Number: 031-A Place of Service:  SNF (31) Provider:  Lulia Schriner X, NP  Patient Care Team: Gigi Onstad X, NP as PCP - General (Internal Medicine) Verlene Glantz X, NP as Nurse Practitioner (Internal Medicine) Charlanne Fredia CROME, MD as Consulting Physician (Internal Medicine) Charlanne Fredia CROME, MD (Internal Medicine)  Extended Emergency Contact Information Primary Emergency Contact: Wright,Larry Mobile Phone: [phone] Relation: Relative Secondary Emergency Contact: Mellone,[name]  [place] Home Phone: [phone] Relation: Son  Code Status:  DNR Goals of care: Advanced Directive information    11/19/2023   11:24 AM  Advanced Directives  Does Patient Have a Medical Advance Directive? Yes  Type of Advance Directive Out of facility DNR (pink MOST or yellow form)  Does patient want to make changes to medical advance directive? No - Patient declined  Pre-existing out of facility DNR order (yellow form or pink MOST form) Yellow form placed in chart (order not valid for inpatient use);Pink MOST/Yellow Form most recent copy in chart - Physician notified to receive inpatient order     Chief Complaint  Patient presents with  . Medical Management of Chronic Issues    Routine visit     HPI:  Pt is a 89 y.o. female seen today for medical management of chronic diseases.    Hemorrhoids, bright red blood noted after a BM, no further anal/rectal bleed, denied abd pain/discomfort, nausea, vomiting.              GERD, stable, on Omeprazole, Hgb 13 10/05/23 CHF, 09/30/21 CXR mild congestive heart failure, CXR 10/25/21 showed CHF. Takes Furosemide  20mg  x2/wk, compensated clinically. Echo  EF 60%, DOE/cough on and off. BNP 24 02/17/23             Peripheral neuropathy, taking Gabapentin , Vit B12 Gait abnormality, uses walker to ambulate slowly.       Hx of TIA. Allergic to statin. On ASA.  HPOA desired no hospital evaluation in the past. ED  07/27/23 CT head/MR brain no acute intracranial changes.  Seizure like activity, hx of, likely post fall, off Tramadol , 07/27/23 ED neurology: increase Gabapentin  400mg  every day, f/u neurology out patient-HPOA desires to delay it.  OA: T5, T8, L3 compression fxs. Takes Tylenol  and Gabapentin  for pain, off Tramadol , DDD per CT head/cervical spine 08/02/20, Vit D 61 05/19/23             Hx of CVA per CT head 08/02/20 Remote infarcts within the a occipital cortices bilaterally again noted. Interval development of remote infarcts within the left parietal lobe and periventricular white matte. On ASA.  07/27/23. CT head/MR brain: No acute intracranial abnormality             Dementia, 07/15/19 CT head Mild generalized parenchymal atrophy and chronic small vessel ischemic disease, 07/27/23. CT head/MR brain: No acute intracranial abnormality. Mild chronic microvascular ischemic disease with a few scattered remote infarcts involving the left parietal and bilateral occipital Lobes, tolerated Memantine  well. 06/22/22 MMSE 17/30             HTN, loose blood pressure control, on Benazepril              CKD Bun/creat 33/1.4 10/05/23             Gout, stable, on Allopurinol                    Hypothyroidism, on Levothyroxine , TSH 2.48 05/19/23  Abnormal CXR, suggested CT chest, not candidate for workup     Past Medical History:  Diagnosis Date  . Cognitive changes   . Dementia (HCC)   . Depression   . Gout   . Hypertension   . Hypothyroidism   . Peripheral neuropathy    History reviewed. No pertinent surgical history.  Allergies  Allergen Reactions  . Actonel [Risedronate Sodium]   . Actonel [Risedronate] Other (See Comments)    Allergic, per [facility]  . Hct [Hydrochlorothiazide]   . Hydrochlorothiazide Other (See Comments)    Allergic, per [facility]  . Lipitor [Atorvastatin Calcium]   . Lipitor [Atorvastatin] Other (See Comments)    Allergic, per [facility]  . Neomycin Other (See Comments)    Unknown  been so long ago, a Dermatologist told me  . Neomycin Other (See Comments)    Allergic, per [facility]  . Polysporin [Bacitracin-Polymyxin B]   . Polysporin [Bacitracin-Polymyxin B] Other (See Comments)    Allergic, per [facility]  . Prolia  [Denosumab ]   . Prolia  [Denosumab ] Other (See Comments)  . Sulfa Antibiotics   . Sulfa Antibiotics Other (See Comments)    Allergic, per [facility]  . Zocor [Simvastatin]   . Zocor [Simvastatin] Other (See Comments)    Allergic, per [facility]    Outpatient Encounter Medications as of 01/19/2024  Medication Sig  . acetaminophen  (TYLENOL ) 325 MG tablet Take 650 mg by mouth in the morning, at noon, and at bedtime.  . allopurinol  (ZYLOPRIM ) 100 MG tablet Take 100 mg by mouth daily.  . aspirin  81 MG chewable tablet Chew 81 mg by mouth daily.  . benazepril  (LOTENSIN ) 5 MG tablet Take 5 mg by mouth daily.  . Calcium Carb-Cholecalciferol 500-400 MG-UNIT TABS Take 2 tablets by mouth daily.   . Cholecalciferol (VITAMIN D ) 50 MCG (2000 UT) CAPS Take 2,000 Units by mouth daily.  SABRA docusate sodium (COLACE) 100 MG capsule Take 100 mg by mouth 2 (two) times daily. Give 1 tablet by mouth two times a day for constipation  . furosemide  (LASIX ) 20 MG tablet Take 20 mg by mouth 2 (two) times a week. Monday & Thursday.  . gabapentin  (NEURONTIN ) 400 MG capsule Take 1 capsule (400 mg total) by mouth at bedtime.  . hydrocortisone (ANUSOL-HC) 2.5 % rectal cream Place 1 Application rectally 2 (two) times daily. Apply to Anus topically two times a day for Hemorrhoids  . levothyroxine  (SYNTHROID ) 100 MCG tablet Take 100 mcg by mouth once a week. On Wednesday  . levothyroxine  (SYNTHROID ) 50 MCG tablet Take 50 mcg by mouth daily before breakfast. Sun,Mon,Tue, Thur,Fri,Sat  . memantine  (NAMENDA ) 10 MG tablet Take 5 mg by mouth 2 (two) times daily.  SABRA omeprazole (PRILOSEC) 20 MG capsule Take 20 mg by mouth daily. Give 1 tablet by mouth one time a day for nausea/vomit Give it on an empty  stomach.Donot crush  . potassium chloride  (KLOR-CON ) 20 MEQ packet Take 20 mEq by mouth 2 (two) times a week. Monday and Thursday   No facility-administered encounter medications on file as of 01/19/2024.    Review of Systems  Constitutional:  Negative for activity change, diaphoresis and fever.  HENT:  Positive for hearing loss. Negative for congestion.   Eyes:  Negative for visual disturbance.  Respiratory:  Negative for cough, shortness of breath and wheezing.        DOE   Cardiovascular:  Negative for leg swelling.  Gastrointestinal:  Negative for abdominal pain and constipation.  Genitourinary:  Negative for dysuria and  urgency.  Musculoskeletal:  Positive for arthralgias, back pain and gait problem.       Chronic, aches.  Baseline walker with walker slowly with SBA  Skin:  Negative for color change.  Neurological:  Negative for facial asymmetry, speech difficulty and weakness.  Psychiatric/Behavioral:  Positive for confusion. Negative for sleep disturbance. The patient is not nervous/anxious.     Immunization History  Administered Date(s) Administered  . Fluad Quad(high Dose 65+) 01/01/2022  . INFLUENZA, HIGH DOSE SEASONAL PF 12/11/2018, 01/01/2022, 12/10/2022, 12/15/2023  . Influenza-Unspecified 03/24/2018, 12/21/2019, 12/26/2020  . Moderna Covid-19 Vaccine  Bivalent Booster 53yrs & up 01/09/2022, 12/24/2022  . Moderna SARS-COV2 Booster Vaccination 02/20/2021, 07/26/2021  . Moderna Sars-Covid-2 Vaccination 03/12/2019, 04/09/2019, 04/21/2019, 01/17/2020, 08/07/2020  . Pneumococcal Conjugate-13 09/09/2012, 10/28/2013  . Pneumococcal Polysaccharide-23 04/02/2009, 04/16/2009  . Tdap 07/15/2019  . Unspecified SARS-COV-2 Vaccination 01/04/2024  . Zoster Recombinant(Shingrix) 04/16/2009, 06/20/2022  . Zoster, Live 04/16/2009  . Zoster, Unspecified 04/16/2009   Pertinent  Health Maintenance Due  Topic Date Due  . Influenza Vaccine  Completed  . DEXA SCAN  Discontinued       01/07/2022    9:04 AM 02/06/2022   10:48 AM 04/21/2022    3:50 PM 06/12/2022   10:12 AM 06/12/2022   12:58 PM  Fall Risk  Falls in the past year? 0 0 0 0 1  Was there an injury with Fall? 0 0 0 0 0  Fall Risk Category Calculator 0 0 0 0 2  Fall Risk Category (Retired) Low  Low      (RETIRED) Patient Fall Risk Level Moderate fall risk  Moderate fall risk      Patient at Risk for Falls Due to History of fall(s) History of fall(s) No Fall Risks No Fall Risks History of fall(s);Impaired balance/gait;Impaired mobility  Fall risk Follow up Falls evaluation completed  Falls evaluation completed  Falls evaluation completed Falls evaluation completed Falls evaluation completed;Education provided;Falls prevention discussed;Follow up appointment     Data saved with a previous flowsheet row definition   Functional Status Survey:    Vitals:   01/19/24 1014  BP: 136/65  Pulse: 68  SpO2: 94%  Weight: 162 lb 9.6 oz (73.8 kg)  Height: 5' 5 (1.651 m)   Body mass index is 27.06 kg/m. Physical Exam Vitals and nursing note reviewed.  Constitutional:      Comments: Appears tired  HENT:     Head: Normocephalic and atraumatic.     Nose: Nose normal.     Mouth/Throat:     Mouth: Mucous membranes are moist.  Eyes:     Extraocular Movements: Extraocular movements intact.     Conjunctiva/sclera: Conjunctivae normal.     Pupils: Pupils are equal, round, and reactive to light.  Cardiovascular:     Rate and Rhythm: Normal rate and regular rhythm.     Heart sounds: No murmur heard.    Comments: Weak dorsalis pedis pulses R+L from previous examination.  Pulmonary:     Effort: Pulmonary effort is normal.     Breath sounds: Rales present.     Comments: Bibasilar rales.  Abdominal:     General: Bowel sounds are normal.     Palpations: Abdomen is soft.     Tenderness: There is no abdominal tenderness.  Genitourinary:    Comments: Multiple external hemorrhoids from previous  examination Musculoskeletal:        General: No tenderness.     Cervical back: Normal range of motion and neck supple.  Right lower leg: No edema.     Left lower leg: No edema.  Skin:    General: Skin is warm and dry.  Neurological:     General: No focal deficit present.     Mental Status: She is alert. Mental status is at baseline.     Gait: Gait abnormal.     Comments: Ambulates with walker is her baseline Hx of resolved Right facial, RUE, RLE weakness  Psychiatric:        Mood and Affect: Mood normal.        Behavior: Behavior normal.     Labs reviewed: Recent Labs    04/22/23 0000 07/27/23 1401 10/05/23 0000  NA 137 141 139  K 4.0 3.6 4.6  CL 103 104 1*  CO2 24* 27 27*  GLUCOSE  --  96  --   BUN 21 18 33*  CREATININE 1.3* 1.48* 1.4*  CALCIUM 8.8 9.1 9.7   Recent Labs    02/12/23 0000 07/27/23 1401 10/05/23 0000  AST 17 17 14   ALT 14 11 9   ALKPHOS 98 69 75  BILITOT  --  0.5  --   PROT  --  6.4*  --   ALBUMIN 4.0 3.2* 4.3   Recent Labs    04/22/23 0000 07/27/23 1401 10/05/23 0000  WBC 9.3 9.8 7.8  NEUTROABS 7,933.00 7.8* 5,873.00  HGB 1.8* 12.3 13.0  HCT 36 39.2 39  MCV  --  102.1*  --   PLT 186 202 216   Lab Results  Component Value Date   TSH 2.48 05/19/2023   No results found for: HGBA1C No results found for: CHOL, HDL, LDLCALC, LDLDIRECT, TRIG, CHOLHDL  Significant Diagnostic Results in last 30 days:  No results found.  Assessment/Plan HTN (hypertension) loose blood pressure control, on Benazepril   CKD (chronic kidney disease) stage 3, GFR 30-59 ml/min (HCC) Bun/creat 33/1.4 10/05/23  Gout stable, on Allopurinol         Hypothyroidism on Levothyroxine , TSH 2.48 05/19/23  Major neurocognitive disorder (HCC) Continue memantine , 06/22/22 MMSE 17/30 No behavioral issues, SNF for care needed  History of CVA (cerebrovascular accident) History of 2 strokes, TIA Allergic to statin On ASA No focal weakness and  residual, ambulates with walker  Peripheral neuropathy  taking Gabapentin , Vit B12  Congestive heart failure (CHF) (HCC) Euvolemic Low dose of furosemide   GERD (gastroesophageal reflux disease) Stable, continue omeprazole  Seizure-like activity (HCC) Likely post fall Off tramadol  Increased gabapentin  during ED visit 07/27/2023 H POA delayed neurology follow-up     Family/ staff Communication: Plan of care reviewed with the patient and charge nurse  Labs/tests ordered: None

## 2024-01-19 NOTE — Assessment & Plan Note (Signed)
stable, on Allopurinol       

## 2024-01-21 ENCOUNTER — Encounter: Payer: Self-pay | Admitting: Nurse Practitioner

## 2024-02-24 ENCOUNTER — Non-Acute Institutional Stay (SKILLED_NURSING_FACILITY): Admitting: Family Medicine

## 2024-02-24 ENCOUNTER — Emergency Department (HOSPITAL_COMMUNITY)

## 2024-02-24 ENCOUNTER — Encounter (HOSPITAL_COMMUNITY): Payer: Self-pay | Admitting: Emergency Medicine

## 2024-02-24 ENCOUNTER — Inpatient Hospital Stay (HOSPITAL_COMMUNITY)
Admission: EM | Admit: 2024-02-24 | Discharge: 2024-02-26 | DRG: 071 | Disposition: A | Discharge status: Skilled Nursing Facility | Source: Skilled Nursing Facility | Attending: Internal Medicine | Admitting: Internal Medicine

## 2024-02-24 ENCOUNTER — Other Ambulatory Visit: Payer: Self-pay

## 2024-02-24 DIAGNOSIS — K219 Gastro-esophageal reflux disease without esophagitis: Secondary | ICD-10-CM | POA: Diagnosis present

## 2024-02-24 DIAGNOSIS — G629 Polyneuropathy, unspecified: Secondary | ICD-10-CM | POA: Diagnosis present

## 2024-02-24 DIAGNOSIS — Z79890 Hormone replacement therapy (postmenopausal): Secondary | ICD-10-CM

## 2024-02-24 DIAGNOSIS — F0393 Unspecified dementia, unspecified severity, with mood disturbance: Secondary | ICD-10-CM | POA: Diagnosis present

## 2024-02-24 DIAGNOSIS — R4182 Altered mental status, unspecified: Secondary | ICD-10-CM

## 2024-02-24 DIAGNOSIS — Z881 Allergy status to other antibiotic agents status: Secondary | ICD-10-CM | POA: Diagnosis not present

## 2024-02-24 DIAGNOSIS — N1832 Chronic kidney disease, stage 3b: Secondary | ICD-10-CM | POA: Diagnosis present

## 2024-02-24 DIAGNOSIS — Z888 Allergy status to other drugs, medicaments and biological substances status: Secondary | ICD-10-CM | POA: Diagnosis not present

## 2024-02-24 DIAGNOSIS — J209 Acute bronchitis, unspecified: Secondary | ICD-10-CM | POA: Diagnosis present

## 2024-02-24 DIAGNOSIS — Z66 Do not resuscitate: Secondary | ICD-10-CM | POA: Diagnosis present

## 2024-02-24 DIAGNOSIS — Z8673 Personal history of transient ischemic attack (TIA), and cerebral infarction without residual deficits: Secondary | ICD-10-CM

## 2024-02-24 DIAGNOSIS — R062 Wheezing: Principal | ICD-10-CM

## 2024-02-24 DIAGNOSIS — F32A Depression, unspecified: Secondary | ICD-10-CM | POA: Diagnosis present

## 2024-02-24 DIAGNOSIS — Z883 Allergy status to other anti-infective agents status: Secondary | ICD-10-CM

## 2024-02-24 DIAGNOSIS — I130 Hypertensive heart and chronic kidney disease with heart failure and stage 1 through stage 4 chronic kidney disease, or unspecified chronic kidney disease: Secondary | ICD-10-CM | POA: Diagnosis present

## 2024-02-24 DIAGNOSIS — Z1152 Encounter for screening for COVID-19: Secondary | ICD-10-CM | POA: Diagnosis not present

## 2024-02-24 DIAGNOSIS — B9719 Other enterovirus as the cause of diseases classified elsewhere: Secondary | ICD-10-CM | POA: Diagnosis present

## 2024-02-24 DIAGNOSIS — E039 Hypothyroidism, unspecified: Secondary | ICD-10-CM | POA: Diagnosis present

## 2024-02-24 DIAGNOSIS — Z7982 Long term (current) use of aspirin: Secondary | ICD-10-CM | POA: Diagnosis not present

## 2024-02-24 DIAGNOSIS — R531 Weakness: Secondary | ICD-10-CM | POA: Diagnosis present

## 2024-02-24 DIAGNOSIS — M109 Gout, unspecified: Secondary | ICD-10-CM | POA: Diagnosis present

## 2024-02-24 DIAGNOSIS — B9789 Other viral agents as the cause of diseases classified elsewhere: Secondary | ICD-10-CM | POA: Diagnosis present

## 2024-02-24 DIAGNOSIS — Z634 Disappearance and death of family member: Secondary | ICD-10-CM

## 2024-02-24 DIAGNOSIS — Z79899 Other long term (current) drug therapy: Secondary | ICD-10-CM | POA: Diagnosis not present

## 2024-02-24 DIAGNOSIS — G9340 Encephalopathy, unspecified: Principal | ICD-10-CM | POA: Diagnosis present

## 2024-02-24 DIAGNOSIS — H9190 Unspecified hearing loss, unspecified ear: Secondary | ICD-10-CM | POA: Diagnosis present

## 2024-02-24 LAB — COMPREHENSIVE METABOLIC PANEL WITH GFR
ALT: 8 U/L (ref 0–44)
AST: 18 U/L (ref 15–41)
Albumin: 3.8 g/dL (ref 3.5–5.0)
Alkaline Phosphatase: 123 U/L (ref 38–126)
Anion gap: 10 (ref 5–15)
BUN: 27 mg/dL — ABNORMAL HIGH (ref 8–23)
CO2: 26 mmol/L (ref 22–32)
Calcium: 9.5 mg/dL (ref 8.9–10.3)
Chloride: 106 mmol/L (ref 98–111)
Creatinine, Ser: 1.28 mg/dL — ABNORMAL HIGH (ref 0.44–1.00)
GFR, Estimated: 38 mL/min — ABNORMAL LOW (ref >60)
Glucose, Bld: 112 mg/dL — ABNORMAL HIGH (ref 70–99)
Potassium: 4.2 mmol/L (ref 3.5–5.1)
Sodium: 142 mmol/L (ref 135–145)
Total Bilirubin: 0.3 mg/dL (ref 0.0–1.2)
Total Protein: 6.9 g/dL (ref 6.5–8.1)

## 2024-02-24 LAB — CBC WITH DIFFERENTIAL/PLATELET
Abs Immature Granulocytes: 0.04 K/uL (ref 0.00–0.07)
Basophils Absolute: 0 K/uL (ref 0.0–0.1)
Basophils Relative: 1 %
Eosinophils Absolute: 0.4 K/uL (ref 0.0–0.5)
Eosinophils Relative: 5 %
HCT: 32.3 % — ABNORMAL LOW (ref 36.0–46.0)
Hemoglobin: 9.9 g/dL — ABNORMAL LOW (ref 12.0–15.0)
Immature Granulocytes: 1 %
Lymphocytes Relative: 9 %
Lymphs Abs: 0.8 K/uL (ref 0.7–4.0)
MCH: 30.9 pg (ref 26.0–34.0)
MCHC: 30.7 g/dL (ref 30.0–36.0)
MCV: 100.9 fL — ABNORMAL HIGH (ref 80.0–100.0)
Monocytes Absolute: 0.9 K/uL (ref 0.1–1.0)
Monocytes Relative: 10 %
Neutro Abs: 6.6 K/uL (ref 1.7–7.7)
Neutrophils Relative %: 74 %
Platelets: 196 K/uL (ref 150–400)
RBC: 3.2 MIL/uL — ABNORMAL LOW (ref 3.87–5.11)
RDW: 15.5 % (ref 11.5–15.5)
WBC: 8.8 K/uL (ref 4.0–10.5)
nRBC: 0 % (ref 0.0–0.2)

## 2024-02-24 LAB — RESP PANEL BY RT-PCR (RSV, FLU A&B, COVID)  RVPGX2
Influenza A by PCR: NEGATIVE
Influenza B by PCR: NEGATIVE
Resp Syncytial Virus by PCR: NEGATIVE
SARS Coronavirus 2 by RT PCR: NEGATIVE

## 2024-02-24 LAB — URINALYSIS, W/ REFLEX TO CULTURE (INFECTION SUSPECTED)
Bilirubin Urine: NEGATIVE
Glucose, UA: NEGATIVE mg/dL
Hgb urine dipstick: NEGATIVE
Ketones, ur: NEGATIVE mg/dL
Leukocytes,Ua: NEGATIVE
Nitrite: NEGATIVE
Protein, ur: NEGATIVE mg/dL
Specific Gravity, Urine: 1.018 (ref 1.005–1.030)
pH: 5 (ref 5.0–8.0)

## 2024-02-24 MED ORDER — LEVOTHYROXINE SODIUM 50 MCG PO TABS
50.0000 ug | ORAL_TABLET | ORAL | Status: DC
  Administered 2024-02-25 – 2024-02-26 (×2): 50 ug via ORAL
  Filled 2024-02-24 (×2): qty 1

## 2024-02-24 MED ORDER — IPRATROPIUM-ALBUTEROL 0.5-2.5 (3) MG/3ML IN SOLN
3.0000 mL | Freq: Four times a day (QID) | RESPIRATORY_TRACT | Status: DC | PRN
  Administered 2024-02-24: 21:00:00 3 mL via RESPIRATORY_TRACT
  Filled 2024-02-24: qty 3

## 2024-02-24 MED ORDER — OMEPRAZOLE MAGNESIUM 20 MG PO TBEC: 20.0000 mg | DELAYED_RELEASE_TABLET | Freq: Every day | ORAL | Status: DC

## 2024-02-24 MED ORDER — ASPIRIN 81 MG PO CHEW
81.0000 mg | CHEWABLE_TABLET | Freq: Every day | ORAL | Status: DC
  Administered 2024-02-25 – 2024-02-26 (×2): 81 mg via ORAL
  Filled 2024-02-24 (×2): qty 1

## 2024-02-24 MED ORDER — FUROSEMIDE 20 MG PO TABS
20.0000 mg | ORAL_TABLET | ORAL | Status: DC
  Administered 2024-02-25: 09:00:00 20 mg via ORAL
  Filled 2024-02-24: qty 1

## 2024-02-24 MED ORDER — LACTATED RINGERS IV SOLN: INTRAVENOUS | Status: AC

## 2024-02-24 MED ORDER — LEVOTHYROXINE SODIUM 100 MCG PO TABS: 100.0000 ug | ORAL_TABLET | ORAL | Status: DC

## 2024-02-24 MED ORDER — ACETAMINOPHEN 325 MG PO TABS
650.0000 mg | ORAL_TABLET | Freq: Four times a day (QID) | ORAL | Status: DC | PRN
  Administered 2024-02-25: 09:00:00 650 mg via ORAL
  Filled 2024-02-24: qty 2

## 2024-02-24 MED ORDER — ONDANSETRON HCL 4 MG/2ML IJ SOLN: 4.0000 mg | Freq: Four times a day (QID) | INTRAMUSCULAR | Status: DC | PRN

## 2024-02-24 MED ORDER — PANTOPRAZOLE SODIUM 40 MG PO TBEC
40.0000 mg | DELAYED_RELEASE_TABLET | Freq: Every day | ORAL | Status: DC
  Administered 2024-02-25 – 2024-02-26 (×2): 40 mg via ORAL
  Filled 2024-02-24 (×2): qty 1

## 2024-02-24 MED ORDER — BENAZEPRIL HCL 5 MG PO TABS
5.0000 mg | ORAL_TABLET | Freq: Every day | ORAL | Status: DC
  Administered 2024-02-25 – 2024-02-26 (×2): 5 mg via ORAL
  Filled 2024-02-24 (×2): qty 1

## 2024-02-24 MED ORDER — POTASSIUM CHLORIDE CRYS ER 20 MEQ PO TBCR
20.0000 meq | EXTENDED_RELEASE_TABLET | ORAL | Status: DC
  Administered 2024-02-25: 09:00:00 20 meq via ORAL
  Filled 2024-02-24: qty 1

## 2024-02-24 MED ORDER — DM-GUAIFENESIN ER 30-600 MG PO TB12
1.0000 | ORAL_TABLET | Freq: Two times a day (BID) | ORAL | Status: DC
  Administered 2024-02-24 – 2024-02-26 (×4): 1 via ORAL
  Filled 2024-02-24 (×4): qty 1

## 2024-02-24 MED ORDER — ALBUTEROL SULFATE (2.5 MG/3ML) 0.083% IN NEBU
5.0000 mg | INHALATION_SOLUTION | Freq: Once | RESPIRATORY_TRACT | Status: AC
  Administered 2024-02-24: 18:00:00 5 mg via RESPIRATORY_TRACT
  Filled 2024-02-24: qty 6

## 2024-02-24 MED ORDER — DOCUSATE SODIUM 100 MG PO CAPS
100.0000 mg | ORAL_CAPSULE | Freq: Two times a day (BID) | ORAL | Status: DC
  Administered 2024-02-24 – 2024-02-26 (×4): 100 mg via ORAL
  Filled 2024-02-24 (×4): qty 1

## 2024-02-24 MED ORDER — OYSTER SHELL CALCIUM/D3 500-5 MG-MCG PO TABS
2.0000 | ORAL_TABLET | Freq: Every day | ORAL | Status: DC
  Administered 2024-02-25: 09:00:00 2 via ORAL
  Filled 2024-02-24 (×2): qty 2

## 2024-02-24 MED ORDER — ACETAMINOPHEN 650 MG RE SUPP: 650.0000 mg | Freq: Four times a day (QID) | RECTAL | Status: DC | PRN

## 2024-02-24 MED ORDER — BISACODYL 5 MG PO TBEC: 5.0000 mg | DELAYED_RELEASE_TABLET | Freq: Every day | ORAL | Status: DC | PRN

## 2024-02-24 MED ORDER — HEPARIN SODIUM (PORCINE) 5000 UNIT/ML IJ SOLN
5000.0000 [IU] | Freq: Three times a day (TID) | INTRAMUSCULAR | Status: DC
  Administered 2024-02-24 – 2024-02-25 (×2): 5000 [IU] via SUBCUTANEOUS
  Filled 2024-02-24 (×2): qty 1

## 2024-02-24 MED ORDER — ALLOPURINOL 100 MG PO TABS
100.0000 mg | ORAL_TABLET | Freq: Every day | ORAL | Status: DC
  Administered 2024-02-25 – 2024-02-26 (×2): 100 mg via ORAL
  Filled 2024-02-24 (×2): qty 1

## 2024-02-24 MED ORDER — GABAPENTIN 400 MG PO CAPS
400.0000 mg | ORAL_CAPSULE | Freq: Every day | ORAL | Status: DC
  Administered 2024-02-25: 400 mg via ORAL
  Filled 2024-02-24: qty 1

## 2024-02-24 MED ORDER — LACTATED RINGERS IV SOLN: INTRAVENOUS | Status: DC

## 2024-02-24 MED ORDER — SENNOSIDES-DOCUSATE SODIUM 8.6-50 MG PO TABS: 1.0000 | ORAL_TABLET | Freq: Every evening | ORAL | Status: DC | PRN

## 2024-02-24 MED ORDER — MEMANTINE HCL 10 MG PO TABS
5.0000 mg | ORAL_TABLET | Freq: Two times a day (BID) | ORAL | Status: DC
  Administered 2024-02-24 – 2024-02-26 (×4): 5 mg via ORAL
  Filled 2024-02-24 (×4): qty 1

## 2024-02-24 MED ORDER — ONDANSETRON HCL 4 MG PO TABS: 4.0000 mg | ORAL_TABLET | Freq: Four times a day (QID) | ORAL | Status: DC | PRN

## 2024-02-24 MED ORDER — VITAMIN D 25 MCG (1000 UNIT) PO TABS
2000.0000 [IU] | ORAL_TABLET | Freq: Every day | ORAL | Status: DC
  Administered 2024-02-25 – 2024-02-26 (×2): 2000 [IU] via ORAL
  Filled 2024-02-24 (×2): qty 2

## 2024-02-24 NOTE — Progress Notes (Unsigned)
 Provider:  Garnette Pinal, MD Location:      Place of Service:     PCP: Mast, Man X, NP Patient Care Team: Mast, Man X, NP as PCP - General (Internal Medicine) Mast, Man X, NP as Nurse Practitioner (Internal Medicine) Charlanne Fredia CROME, MD as Consulting Physician (Internal Medicine) Charlanne Fredia CROME, MD (Internal Medicine)  Extended Emergency Contact Information Primary Emergency Contact: [facility] Phone: [phone] Relation: Relative Secondary Emergency Contact: Shiel,[name]  [place] Home Phone: [phone] Relation: Son  Code Status:  Goals of Care: Advanced Directive information    11/19/2023   11:24 AM  Advanced Directives  Does Patient Have a Medical Advance Directive? Yes  Type of Advance Directive Out of facility DNR (pink MOST or yellow form)  Does patient want to make changes to medical advance directive? No - Patient declined  Pre-existing out of facility DNR order (yellow form or pink MOST form) Yellow form placed in chart (order not valid for inpatient use);Pink MOST/Yellow Form most recent copy in chart - Physician notified to receive inpatient order     HPI: Patient is a 89 y.o. female seen today at request of nursing for altered mental status and change in behaviors.  By history patient did not want to get out of bed today to eat breakfast and has generally been less communicative with no specific complaints. She has a history of major neurocognitive disorder with MMSE of 17 over 31-1/2 years ago.  She also has chronic kidney disease seizure-like activity after CVA, gait instability using a walker for ambulation, congestive heart failure. Relevant Folkes were contacted regarding her change in behavior and agreed with recommendation to send her to the hospital for further evaluation.  Past Medical History:  Diagnosis Date   Cognitive changes    Dementia (HCC)    Depression    Gout    Hypertension    Hypothyroidism    Peripheral  neuropathy    No past surgical history on file.  reports that she has an unknown smoking status. She has never used smokeless tobacco. She reports that she does not drink alcohol and does not use drugs. Social History   Socioeconomic History   Marital status: Widowed    Spouse name: Not on file   Number of children: Not on file   Years of education: Not on file   Highest education level: Not on file  Occupational History   Not on file  Tobacco Use   Smoking status: Unknown   Smokeless tobacco: Never  Vaping Use   Vaping status: Never Used  Substance and Sexual Activity   Alcohol use: Never   Drug use: Never   Sexual activity: Not on file  Other Topics Concern   Not on file  Social History Narrative   ** Merged History Encounter **       Social Drivers of Health   Tobacco Use: Unknown (01/21/2024)   Patient History    Smoking Tobacco Use: Unknown    Smokeless Tobacco Use: Never    Passive Exposure: Not on file  Financial Resource Strain: Not on file  Food Insecurity: Not on file  Transportation Needs: Not on file  Physical Activity: Not on file  Stress: Not on file  Social Connections: Not on file  Intimate Partner Violence: Not on file  Depression (PHQ2-9): Low Risk (07/06/2023)   Depression (PHQ2-9)    PHQ-2 Score: 0  Alcohol Screen: Not on file  Housing: Not on file  Utilities: Not  on file  Health Literacy: Not on file    Functional Status Survey:    No family history on file.  Health Maintenance  Topic Date Due   COVID-19 Vaccine (11 - 2025-26 season) 07/04/2024   Medicare Annual Wellness (AWV)  07/05/2024   DTaP/Tdap/Td (2 - Td or Tdap) 07/14/2029   Pneumococcal Vaccine: 50+ Years  Completed   Influenza Vaccine  Completed   Zoster Vaccines- Shingrix  Completed   Meningococcal B Vaccine  Aged Out   Bone Density Scan  Discontinued    Allergies[1]  Outpatient Encounter Medications as of 02/24/2024  Medication Sig   acetaminophen  (TYLENOL ) 325  MG tablet Take 650 mg by mouth in the morning, at noon, and at bedtime.   allopurinol  (ZYLOPRIM ) 100 MG tablet Take 100 mg by mouth daily.   aspirin  81 MG chewable tablet Chew 81 mg by mouth daily.   benazepril  (LOTENSIN ) 5 MG tablet Take 5 mg by mouth daily.   Calcium  Carb-Cholecalciferol  500-400 MG-UNIT TABS Take 2 tablets by mouth daily.    Cholecalciferol  (VITAMIN D ) 50 MCG (2000 UT) CAPS Take 2,000 Units by mouth daily.   docusate sodium  (COLACE) 100 MG capsule Take 100 mg by mouth 2 (two) times daily. Give 1 tablet by mouth two times a day for constipation   furosemide  (LASIX ) 20 MG tablet Take 20 mg by mouth 2 (two) times a week. Monday & Thursday.   gabapentin  (NEURONTIN ) 400 MG capsule Take 1 capsule (400 mg total) by mouth at bedtime.   hydrocortisone (ANUSOL-HC) 2.5 % rectal cream Place 1 Application rectally 2 (two) times daily. Apply to Anus topically two times a day for Hemorrhoids   levothyroxine  (SYNTHROID ) 100 MCG tablet Take 100 mcg by mouth once a week. On Wednesday   levothyroxine  (SYNTHROID ) 50 MCG tablet Take 50 mcg by mouth daily before breakfast. Sun,Mon,Tue, Thur,Fri,Sat   memantine  (NAMENDA ) 10 MG tablet Take 5 mg by mouth 2 (two) times daily.   omeprazole  (PRILOSEC ) 20 MG capsule Take 20 mg by mouth daily. Give 1 tablet by mouth one time a day for nausea/vomit Give it on an empty stomach.Donot crush   potassium chloride  (KLOR-CON ) 20 MEQ packet Take 20 mEq by mouth 2 (two) times a week. Monday and Thursday   No facility-administered encounter medications on file as of 02/24/2024.    Review of Systems  Unable to perform ROS: Mental status change    There were no vitals filed for this visit. There is no height or weight on file to calculate BMI. Physical Exam Vitals and nursing note reviewed.  Constitutional:      Comments: Patient lying in bed and minimally arousable.  She did respond to noxious stimuli such as shining a penlight to check pupils but would not  follow commands  HENT:     Mouth/Throat:     Mouth: Mucous membranes are moist.     Pharynx: Oropharynx is clear.  Eyes:     Pupils: Pupils are equal, round, and reactive to light.  Cardiovascular:     Rate and Rhythm: Normal rate and regular rhythm.  Pulmonary:     Effort: Pulmonary effort is normal.     Breath sounds: Rhonchi present.  Abdominal:     General: Bowel sounds are normal.     Tenderness: There is no abdominal tenderness. There is no guarding.  Skin:    General: Skin is warm and dry.  Neurological:     Comments: Patient very somnolent  Labs reviewed: Basic Metabolic Panel: Recent Labs    04/22/23 0000 07/27/23 1401 10/05/23 0000  NA 137 141 139  K 4.0 3.6 4.6  CL 103 104 1*  CO2 24* 27 27*  GLUCOSE  --  96  --   BUN 21 18 33*  CREATININE 1.3* 1.48* 1.4*  CALCIUM  8.8 9.1 9.7   Liver Function Tests: Recent Labs    07/27/23 1401 10/05/23 0000  AST 17 14  ALT 11 9  ALKPHOS 69 75  BILITOT 0.5  --   PROT 6.4*  --   ALBUMIN 3.2* 4.3   No results for input(s): LIPASE, AMYLASE in the last 8760 hours. No results for input(s): AMMONIA in the last 8760 hours. CBC: Recent Labs    04/22/23 0000 07/27/23 1401 10/05/23 0000  WBC 9.3 9.8 7.8  NEUTROABS 7,933.00 7.8* 5,873.00  HGB 1.8* 12.3 13.0  HCT 36 39.2 39  MCV  --  102.1*  --   PLT 186 202 216   Cardiac Enzymes: No results for input(s): CKTOTAL, CKMB, CKMBINDEX, TROPONINI in the last 8760 hours. BNP: Invalid input(s): POCBNP No results found for: HGBA1C Lab Results  Component Value Date   TSH 2.48 05/19/2023   No results found for: VITAMINB12 No results found for: FOLATE No results found for: IRON, TIBC, FERRITIN  Imaging and Procedures obtained prior to SNF admission: MR BRAIN WO CONTRAST Result Date: 07/27/2023 CLINICAL DATA:  Follow-up examination for stroke. EXAM: MRI HEAD WITHOUT CONTRAST TECHNIQUE: Multiplanar, multiecho pulse sequences of the brain  and surrounding structures were obtained without intravenous contrast. COMPARISON:  CT from earlier the same day. FINDINGS: Brain: Examination somewhat degraded by motion artifact. Cerebral volume within normal limits. Patchy T2/FLAIR hyperintensity involving the periventricular deep white matter both cerebral hemispheres, consistent with chronic small vessel ischemic disease, mild for age. Few scattered remote cortical infarcts noted about the occipital lobes bilaterally. Remote left parietal infarct noted. Mild chronic hemosiderin staining noted about these areas of chronic ischemia. No abnormal foci of restricted diffusion to suggest acute or subacute ischemia. Gray-white matter differentiation otherwise maintained. No acute intracranial hemorrhage. Single punctate chronic microhemorrhage noted within the right cerebellum. No mass lesion, midline shift or mass effect. Mild ventricular prominence related to underlying atrophy without hydrocephalus. No extra-axial fluid collection. Pituitary gland within normal limits. Vascular: Major intracranial vascular flow voids are maintained. Skull and upper cervical spine: Craniocervical junction within normal limits. Bone marrow signal intensity normal. No scalp soft tissue abnormality. Sinuses/Orbits: Prior bilateral ocular lens replacement. Paranasal sinuses are largely clear. No significant mastoid effusion. Other: None. IMPRESSION: 1. No acute intracranial abnormality. 2. Mild chronic microvascular ischemic disease with a few scattered remote infarcts involving the left parietal and bilateral occipital lobes. Electronically Signed   By: Morene Hoard M.D.   On: 07/27/2023 22:46   CT Head Wo Contrast Result Date: 07/27/2023 CLINICAL DATA:  Altered mental status, previous strokes EXAM: CT HEAD WITHOUT CONTRAST TECHNIQUE: Contiguous axial images were obtained from the base of the skull through the vertex without intravenous contrast. RADIATION DOSE REDUCTION:  This exam was performed according to the departmental dose-optimization program which includes automated exposure control, adjustment of the mA and/or kV according to patient size and/or use of iterative reconstruction technique. COMPARISON:  11/24/2020 FINDINGS: Brain: Stable atrophy pattern and chronic white matter microvascular ischemic change throughout the periventricular white matter. Similar remote areas of infarct in the left parietal lobe and both occipital lobes. No acute intracranial hemorrhage, new mass lesion, new  infarction, midline shift, herniation, hydrocephalus, extra-axial fluid collection. Stable mild ventricular enlargement. Cisterns are patent. Mild cerebellar atrophy as well. Vascular: No hyperdense vessel or unexpected calcification. Skull: Normal. Negative for fracture or focal lesion. Sinuses/Orbits: No acute finding. Other: None. IMPRESSION: 1. Stable CT head. No acute intracranial abnormality by noncontrast CT. Electronically Signed   By: CHRISTELLA.  Shick M.D.   On: 07/27/2023 17:00    Assessment/Plan Assessment & Plan Altered mental status, unspecified altered mental status type With history of neurocognitive disorder prior TIA and CVA,, similar causation's may be at play here.  Needs further evaluation but also given her medical history do not think aggressive treatment is warranted, but would be more in favor of easily correctable problem like infection which might be treatable.  Family/ staff Communication:   Labs/tests ordered:  .smmsig     [1]  Allergies Allergen Reactions   Actonel [Risedronate Sodium]    Actonel [Risedronate] Other (See Comments)    Allergic, per [facility]   Hct [Hydrochlorothiazide]    Hydrochlorothiazide Other (See Comments)    Allergic, per [facility]   Lipitor [Atorvastatin Calcium ]    Lipitor [Atorvastatin] Other (See Comments)    Allergic, per [facility]   Neomycin Other (See Comments)    Unknown been so long ago, a Dermatologist told me   Neomycin  Other (See Comments)    Allergic, per [facility]   Polysporin [Bacitracin-Polymyxin B]    Polysporin [Bacitracin-Polymyxin B] Other (See Comments)    Allergic, per [facility]   Prolia  [Denosumab ]    Prolia  [Denosumab ] Other (See Comments)   Sulfa Antibiotics    Sulfa Antibiotics Other (See Comments)    Allergic, per [facility]   Zocor [Simvastatin]    Zocor [Simvastatin] Other (See Comments)    Allergic, per [facility]

## 2024-02-24 NOTE — ED Provider Notes (Signed)
 [place] EMERGENCY DEPARTMENT AT [facility] Provider Note   CSN: [id number] Arrival date & time: 02/24/24  1639     Patient presents with: Altered Mental Status   [name] is a 89 y.o. female history of dementia, depression, hypertension here presenting with altered mental status.  Patient was noted to be altered and confused today.  Patient was sent in from facility for further evaluation.  Patient unable to give any history   The history is provided by the EMS personnel.       Prior to Admission medications  Medication Sig Start Date End Date Taking? Authorizing Provider  acetaminophen  (TYLENOL ) 325 MG tablet Take 650 mg by mouth in the morning, at noon, and at bedtime.    [provider]  allopurinol  (ZYLOPRIM ) 100 MG tablet Take 100 mg by mouth daily.    [provider]  aspirin  81 MG chewable tablet Chew 81 mg by mouth daily.    [provider]  benazepril  (LOTENSIN ) 5 MG tablet Take 5 mg by mouth daily.    [provider]  Calcium  Carb-Cholecalciferol  500-400 MG-UNIT TABS Take 2 tablets by mouth daily.     [provider]  Cholecalciferol  (VITAMIN D ) 50 MCG (2000 UT) CAPS Take 2,000 Units by mouth daily.    [provider]  docusate sodium  (COLACE) 100 MG capsule Take 100 mg by mouth 2 (two) times daily. Give 1 tablet by mouth two times a day for constipation    [provider]  furosemide  (LASIX ) 20 MG tablet Take 20 mg by mouth 2 (two) times a week. Monday & Thursday.    [provider]  gabapentin  (NEURONTIN ) 400 MG capsule Take 1 capsule (400 mg total) by mouth at bedtime. 07/27/23   Francesca Elsie CROME, MD  hydrocortisone (ANUSOL-HC) 2.5 % rectal cream Place 1 Application rectally 2 (two) times daily. Apply to Anus topically two times a day for Hemorrhoids    [provider]  levothyroxine  (SYNTHROID ) 100 MCG tablet Take 100 mcg by mouth once a week. On Wednesday    [provider]  levothyroxine  (SYNTHROID ) 50 MCG tablet Take 50 mcg by mouth daily before breakfast. Sun,Mon,Tue, Thur,Fri,Sat    [provider]  memantine  (NAMENDA ) 10 MG tablet Take 5 mg by mouth 2 (two) times daily.    [provider]  omeprazole  (PRILOSEC ) 20 MG capsule Take 20 mg by mouth daily. Give 1 tablet by mouth one time a day for nausea/vomit Give it on an empty stomach.Donot crush    [provider]  potassium chloride  (KLOR-CON ) 20 MEQ packet Take 20 mEq by mouth 2 (two) times a week. Monday and Thursday    [provider]    Allergies: Actonel [risedronate sodium], Actonel [risedronate], Hct [hydrochlorothiazide], Hydrochlorothiazide, Lipitor [atorvastatin calcium ], Lipitor [atorvastatin], Neomycin, Neomycin, Polysporin [bacitracin-polymyxin b], Polysporin [bacitracin-polymyxin b], Prolia  [denosumab ], Prolia  [denosumab ], Sulfa antibiotics, Sulfa antibiotics, Zocor [simvastatin], and Zocor [simvastatin]    Review of Systems  Psychiatric/Behavioral:  Positive for confusion.   All other systems reviewed and are negative.   Updated Vital Signs BP (!) 148/67   Pulse 79   Temp (!) 96.7 F (35.9 C) (Axillary)   Resp 15   Ht 5' 5 (1.651 m)   Wt 74 kg   SpO2 92%   BMI 27.15 kg/m   Physical Exam Vitals and nursing note reviewed.  Constitutional:      Comments: Confused  HENT:     Head: Normocephalic.  Nose: Nose normal.     Mouth/Throat:     Mouth: Mucous membranes are moist.  Eyes:     Extraocular Movements: Extraocular movements intact.     Pupils: Pupils are equal, round, and reactive to light.  Cardiovascular:     Rate and Rhythm: Normal rate and regular rhythm.     Pulses: Normal pulses.  Pulmonary:     Comments: Mild diffuse wheezing Abdominal:     General: Abdomen is flat.  Musculoskeletal:        General: Normal range of motion.     Cervical back: Normal range of motion.  Skin:    General: Skin is warm.      Capillary Refill: Capillary refill takes less than 2 seconds.  Neurological:     Comments: ANO x 0 and patient is moving all extremities     (all labs ordered are listed, but only abnormal results are displayed) Labs Reviewed  CBC WITH DIFFERENTIAL/PLATELET - Abnormal; Notable for the following components:      Result Value   RBC 3.20 (*)    Hemoglobin 9.9 (*)    HCT 32.3 (*)    MCV 100.9 (*)    All other components within normal limits  RESP PANEL BY RT-PCR (RSV, FLU A&B, COVID)  [id number]  COMPREHENSIVE METABOLIC PANEL WITH GFR    EKG: EKG Interpretation Date/Time:  Wednesday February 24 2024 16:52:27 EST Ventricular Rate:  78 PR Interval:  144 QRS Duration:  90 QT Interval:  378 QTC Calculation: 431 R Axis:   46  Text Interpretation: Sinus rhythm No significant change since last tracing Confirmed by Patt Alm DEL [phone]) on 02/24/2024 5:05:27 PM  Radiology: CT HEAD WO CONTRAST ( ) Result Date: 02/24/2024 EXAM: CT HEAD WITHOUT CONTRAST 02/24/2024 05:46:17 PM TECHNIQUE: CT of the head was performed without the administration of intravenous contrast. Automated exposure control, iterative reconstruction, and/or weight based adjustment of the mA/kV was utilized to reduce the radiation dose to as low as reasonably achievable. COMPARISON: 07/27/2023 CLINICAL HISTORY: Mental status change, unknown cause. FINDINGS: BRAIN AND VENTRICLES: No acute hemorrhage. No evidence of acute infarct. No hydrocephalus. No extra-axial collection. No mass effect or midline shift. Stable moderate generalized cerebral volume loss. Stable moderate diffuse periventricular and deep cerebral white matter disease. Stable chronic encephalomalacia in left parietal and bilateral occipital lobes. Moderate calcific atheromatous disease within the carotid siphons. ORBITS: Status post bilateral cataract surgery. No acute abnormality. SINUSES: Moderate mucosal disease within ethmoid air cells. SOFT TISSUES AND SKULL: No  acute soft tissue abnormality. No skull fracture. LIMITATIONS/ARTIFACTS: Motion artifact limits examination. IMPRESSION: 1. No acute intracranial abnormality, within the limits of motion artifact. 2. Stable moderate generalized cerebral volume loss, diffuse periventricular and deep cerebral white matter disease. 3. Chronic encephalomalacia in left parietal and bilateral occipital lobes. Electronically signed by: Lonni Necessary MD 02/24/2024 05:53 PM EST RP Workstation: [id number]   DG Chest Port 1 View Result Date: 02/24/2024 CLINICAL DATA:  SOB cough EXAM: PORTABLE CHEST - 1 VIEW COMPARISON:  11/26/2020 FINDINGS: Lower lung volumes with elevation of the right hemidiaphragm. No focal airspace consolidation, pleural effusion, or pneumothorax. Mild cardiomegaly. Tortuous aorta with aortic atherosclerosis. No acute fracture or destructive lesions. Multilevel thoracic osteophytosis. IMPRESSION: Low lung volumes and mild cardiomegaly. Otherwise, no acute cardiopulmonary abnormality. Electronically Signed   By: Rogelia Myers M.D.   On: 02/24/2024 17:33     Procedures   Medications Ordered in the ED - No data to display  Medical Decision Making [name] is a 89 y.o. female here presenting with cough and confusion.  Consider head bleed versus UTI versus pneumonia versus respiratory illness.  Went to get CBC and CMP and chest x-Isidro and urinalysis and COVID and flu and RSV.  Will get CT head given altered mental status  7:07 PM I reviewed patient's labs and CBC CMP unremarkable.  UA showed no UTI and patient's COVID and flu and RSV are negative.  CT head and chest x-Clum unremarkable.  However patient still has a lot of wheezing and is still persistently confused.  I added on respiratory viral panel.  This point hospitalist to admit   Problems Addressed: Altered mental status, unspecified altered mental status type: acute illness or injury Wheezing: acute  illness or injury  Amount and/or Complexity of Data Reviewed Labs: ordered. Decision-making details documented in ED Course. Radiology: ordered and independent interpretation performed. Decision-making details documented in ED Course.  Risk Prescription drug management. Decision regarding hospitalization.      Final diagnoses:  None    ED Discharge Orders     None          Patt Alm Macho, MD 02/24/24 TYRA

## 2024-02-24 NOTE — ED Triage Notes (Signed)
 Pt arrives via EMS from [facility] with reports of AMS, noticed around 1600 yesterday. Staff reports she is more lethargic than normal, usually walks with walker. Hx of dementia. EMS reports combative at times.

## 2024-02-24 NOTE — Assessment & Plan Note (Addendum)
 With history of neurocognitive disorder prior TIA and CVA,, similar causation's may be at play here.  Needs further evaluation but also given her medical history do not think aggressive treatment is warranted, but would be more in favor of easily correctable problem like infection which might be treatable.

## 2024-02-24 NOTE — Progress Notes (Signed)
 Called patient Son in the chart for admission history. Gave update about patient from MD note and Primary RN assessment.

## 2024-02-24 NOTE — H&P (Signed)
 History and Physical  [name] FMW:[id number] DOB: 1924-12-25 DOA: 02/24/2024  PCP: Mast, Man X, NP   Chief Complaint: Altered mental status, generalized weakness  HPI: [name] is a 89 y.o. female with medical history significant for dementia, gout, HTN, hypothyroidism, peripheral neuropathy, GERD, CHF, CKD 3B, CVA and depression who presented via EMS for evaluation of altered mental status and generalized weakness. Patient sent from friend's home for evaluation of altered mental status, change in behavior and weakness. Per chart review, patient did not want to get out of bed today to eat breakfast and was generally less communicative than usual.  During evaluation, patient with eyes closed but opens my requested. States she does not want to be bothered. She is able to tell me her name and knows she is at [facility] but when asked further questions, patient seems irritated and only interested in further discussions. Collateral obtained from patient's son over the phone who informed me he currently lives in [place] but was told by friends who the patient has some coughing and dry heaving this morning. She was also confused, asking for her husband who passed away more than a decade ago. Reports patient has very poor short-term memory and normally able to ambulate with a walker and feed herself.  ED Course: Initial vitals show temp 96.7, RR 14-27, HR 70-90s, SBP 120-140s, SpO2 92% on room air. Initial labs significant for BUN/creatinine 27/1.28, Hgb 9.9, WBC 8.8, negative flu, RSV and COVID test, UA with no signs of infection. EKG shows sinus rhythm. CXR shows mild cardiomegaly but no active disease.[name] Pt received albuterol  neb. [facility] was consulted for admission.   Review of Systems: Please see HPI for pertinent positives and negatives. A complete 10 system review of systems are otherwise negative.  Past Medical History:  Diagnosis Date   Cognitive changes    Dementia (HCC)    Depression     Gout    Hypertension    Hypothyroidism    Peripheral neuropathy    History reviewed. No pertinent surgical history. Social History:  reports that she has an unknown smoking status. She has never used smokeless tobacco. She reports that she does not drink alcohol and does not use drugs.  Allergies[1]  History reviewed. No pertinent family history.   Prior to Admission medications  Medication Sig Start Date End Date Taking? Authorizing Provider  acetaminophen  (TYLENOL ) 325 MG tablet Take 650 mg by mouth with breakfast, with lunch, and with evening meal.   Yes [provider]  allopurinol  (ZYLOPRIM ) 100 MG tablet Take 100 mg by mouth daily.   Yes [provider]  aspirin  81 MG chewable tablet Chew 81 mg by mouth daily.   Yes [provider]  benazepril  (LOTENSIN ) 5 MG tablet Take 5 mg by mouth daily.   Yes [provider]  Calcium  Carb-Cholecalciferol  500-400 MG-UNIT TABS Take 2 tablets by mouth daily.    Yes [provider]  Cholecalciferol  (VITAMIN D ) 50 MCG (2000 UT) CAPS Take 2,000 Units by mouth daily.   Yes [provider]  Docusate Sodium  100 MG capsule Take 100 mg by mouth 2 (two) times daily.   Yes [provider]  furosemide  (LASIX ) 20 MG tablet Take 20 mg by mouth See admin instructions. Take 20 mg by mouth at 9 AM on Mondays and Thursdays   Yes [provider]  gabapentin  (NEURONTIN ) 400 MG capsule Take 1 capsule (400 mg total) by mouth at bedtime. 07/27/23  Yes Scheving,  Elsie CROME, MD  hydrocortisone (ANUSOL-HC) 2.5 % rectal cream Place 1 Application rectally 2 (two) times daily as needed for hemorrhoids (APPLY TOPICALLY).   Yes [provider]  levothyroxine  (SYNTHROID ) 100 MCG tablet Take 100 mcg by mouth See admin instructions. Take 100 mcg by mouth at 6 AM on Wednesdays ONLY   Yes [provider]  levothyroxine  (SYNTHROID ) 50 MCG tablet Take 50 mcg by mouth See admin instructions. Take  50 mcg by mouth at 6 AM on Sun/Mon/Tues/Thurs/Fri/Sat   Yes [provider]  memantine  (NAMENDA ) 10 MG tablet Take 5 mg by mouth in the morning and at bedtime.   Yes [provider]  omeprazole  (PRILOSEC  OTC) 20 MG tablet Take 20 mg by mouth daily before breakfast.   Yes [provider]  potassium chloride  SA (KLOR-CON  M) 20 MEQ tablet Take 20 mEq by mouth See admin instructions. Take 20 mEq by mouth at 9 AM on Mondays and Thursdays   Yes [provider]    Physical Exam: BP (!) 123/58   Pulse 77   Temp (!) 96.7 F (35.9 C) (Axillary)   Resp 14   Ht 5' 5 (1.651 m)   Wt 74 kg   SpO2 100%   BMI 27.15 kg/m  General: Weak appearing elderly woman laying in bed. No acute distress. HEENT: [place]/AT. Anicteric sclera CV: RRR. No murmurs, rubs, or gallops. No LE edema Pulmonary: Lungs CTAB. Normal effort. Mild expiratory wheezes in the upper lung fields. Abdominal: Soft, nontender, nondistended. Normal bowel sounds. Extremities: Palpable radial and DP pulses. Normal ROM. Skin: Warm and dry. No obvious rash or lesions. Neuro: Alert and oriented x 2.  Generalized weakness. Moves all extremities. Normal sensation to light touch. No focal deficit. Psych: Slightly irritated mood          Labs on Admission:  Basic Metabolic Panel: Recent Labs  Lab 02/24/24 1718  NA 142  K 4.2  CL 106  CO2 26  GLUCOSE 112*  BUN 27*  CREATININE 1.28*  CALCIUM  9.5   Liver Function Tests: Recent Labs  Lab 02/24/24 1718  AST 18  ALT 8  ALKPHOS 123  BILITOT 0.3  PROT 6.9  ALBUMIN 3.8   No results for input(s): LIPASE, AMYLASE in the last 168 hours. No results for input(s): AMMONIA in the last 168 hours. CBC: Recent Labs  Lab 02/24/24 1718  WBC 8.8  NEUTROABS 6.6  HGB 9.9*  HCT 32.3*  MCV 100.9*  PLT 196   Cardiac Enzymes: No results for input(s): CKTOTAL, CKMB, CKMBINDEX, TROPONINI in the last 168 hours. BNP (last 3 results) No results  for input(s): BNP in the last 8760 hours.  ProBNP (last 3 results) No results for input(s): PROBNP in the last 8760 hours.  CBG: No results for input(s): GLUCAP in the last 168 hours.  Radiological Exams on Admission: CT HEAD WO CONTRAST ( ) Result Date: 02/24/2024 EXAM: CT HEAD WITHOUT CONTRAST 02/24/2024 05:46:17 PM TECHNIQUE: CT of the head was performed without the administration of intravenous contrast. Automated exposure control, iterative reconstruction, and/or weight based adjustment of the mA/kV was utilized to reduce the radiation dose to as low as reasonably achievable. COMPARISON: 07/27/2023 CLINICAL HISTORY: Mental status change, unknown cause. FINDINGS: BRAIN AND VENTRICLES: No acute hemorrhage. No evidence of acute infarct. No hydrocephalus. No extra-axial collection. No mass effect or midline shift. Stable moderate generalized cerebral volume loss. Stable moderate diffuse periventricular and deep cerebral white matter disease. Stable chronic encephalomalacia in left parietal and  bilateral occipital lobes. Moderate calcific atheromatous disease within the carotid siphons. ORBITS: Status post bilateral cataract surgery. No acute abnormality. SINUSES: Moderate mucosal disease within ethmoid air cells. SOFT TISSUES AND SKULL: No acute soft tissue abnormality. No skull fracture. LIMITATIONS/ARTIFACTS: Motion artifact limits examination. IMPRESSION: 1. No acute intracranial abnormality, within the limits of motion artifact. 2. Stable moderate generalized cerebral volume loss, diffuse periventricular and deep cerebral white matter disease. 3. Chronic encephalomalacia in left parietal and bilateral occipital lobes. Electronically signed by: Lonni Necessary MD 02/24/2024 05:53 PM EST RP Workstation: [id number]   DG Chest Port 1 View Result Date: 02/24/2024 CLINICAL DATA:  SOB cough EXAM: PORTABLE CHEST - 1 VIEW COMPARISON:  11/26/2020 FINDINGS: Lower lung volumes with elevation of  the right hemidiaphragm. No focal airspace consolidation, pleural effusion, or pneumothorax. Mild cardiomegaly. Tortuous aorta with aortic atherosclerosis. No acute fracture or destructive lesions. Multilevel thoracic osteophytosis. IMPRESSION: Low lung volumes and mild cardiomegaly. Otherwise, no acute cardiopulmonary abnormality. Electronically Signed   By: Rogelia Myers M.D.   On: 02/24/2024 17:33   Assessment/Plan [name] is a 89 y.o. female with medical history significant for dementia, gout, HTN, hypothyroidism, peripheral neuropathy, GERD, CHF, CKD 3B, CVA and depression who presented via EMS for evaluation of altered mental status and generalized weakness and admitted for acute encephalopathy.  # Acute encephalopathy - Patient with history of dementia presenting for evaluation of altered mental status - On my evaluation, patient seems to be close to her baseline mental status but just uninterested in talking - She is oriented to self and place on my assessment, and easily get irritated with further questioning - She remains afebrile, normal white count, no metabolic or electrolyte abnormalities, CXR and CT head unremarkable - The cause of her change in mental status is unclear but worsening depression or dementia on my differential - Follow-up check vitamin B12, TSH, mag, and Phos - IVLR 100 cc/h for 10 hours - Delirium precautions  # Acute bronchitis # Wheezing - Patient with history of bronchitis found to have a congested cough and wheezing on exam - CXR does not show any active disease, negative flu, RSV and COVID test - Start daily Mucinex  and as needed DuoNeb - Check full respiratory viral panel - Incentive spirometer, flutter valve  # HTN - BP stable with SBP in the 120s to 140s - Continue benazepril   # Hx of CHF - No echo on file, patient remains euvolemic on exam - Continue twice weekly Lasix  and potassium supplementation  # CKD 3B - Creatinine of 1.28, stable  compared to baseline of 1.3-1.5 - Continue IV hydration as above  # Hx of CVA - Continue aspirin   # Hypothyroidism - Continue Synthroid   # Dementia - Continue memantine  - Delirium precautions  # GERD - Continue omeprazole   # Peripheral neuropathy - Continue gabapentin   # Gout - Continue allopurinol   # Generalized weakness - In the setting of acute illness - PT/OT eval and treat - Fall precautions   DVT prophylaxis: Lovenox      Code Status: Limited: Do not attempt resuscitation (DNR) -DNR-LIMITED -Do Not Intubate/DNI   Consults called: None  Family Communication: Discussed results/findings and plan for admission with son over the phone  Severity of Illness: The appropriate patient status for this patient is INPATIENT. Inpatient status is judged to be reasonable and necessary in order to provide the required intensity of service to ensure the patient's safety. The patient's presenting symptoms, physical exam findings, and initial radiographic and laboratory  data in the context of their chronic comorbidities is felt to place them at high risk for further clinical deterioration. Furthermore, it is not anticipated that the patient will be medically stable for discharge from the hospital within 2 midnights of admission.   * I certify that at the point of admission it is my clinical judgment that the patient will require inpatient hospital care spanning beyond 2 midnights from the point of admission due to high intensity of service, high risk for further deterioration and high frequency of surveillance required.*  Level of care: Med-Surg   I personally spent a total of 75 minutes in the care of the patient today including preparing to see the patient, getting/reviewing separately obtained history, performing a medically appropriate exam/evaluation, placing orders, documenting clinical information in the EHR, and communicating results.  Lou Claretta HERO, MD 02/24/2024, 8:22  PM [facility] Pager: [phone] Isaiah 41:10   If 7PM-7AM, please contact night-coverage [email or web address] Password [email or web address]     [1]  Allergies Allergen Reactions   Actonel [Risedronate] Other (See Comments)    Allergic, per [facility]   Hydrochlorothiazide Other (See Comments)    Allergic, per [facility]   Lipitor [Atorvastatin] Other (See Comments)    Allergic, per [facility]   Neomycin Other (See Comments)    Allergic, per [facility]   Polysporin [Bacitracin-Polymyxin B] Other (See Comments)    Allergic, per [facility]   Prolia  [Denosumab ] Other (See Comments)    Allergic, per [facility]   Sulfa Antibiotics Other (See Comments)    Allergic, per [facility]   Zocor [Simvastatin] Other (See Comments)    Allergic, per [facility]

## 2024-02-25 ENCOUNTER — Encounter (HOSPITAL_COMMUNITY): Payer: Self-pay | Admitting: Student

## 2024-02-25 DIAGNOSIS — G9340 Encephalopathy, unspecified: Secondary | ICD-10-CM

## 2024-02-25 LAB — BASIC METABOLIC PANEL WITH GFR
Anion gap: 8 (ref 5–15)
BUN: 25 mg/dL — ABNORMAL HIGH (ref 8–23)
CO2: 27 mmol/L (ref 22–32)
Calcium: 9.5 mg/dL (ref 8.9–10.3)
Chloride: 107 mmol/L (ref 98–111)
Creatinine, Ser: 1.26 mg/dL — ABNORMAL HIGH (ref 0.44–1.00)
GFR, Estimated: 39 mL/min — ABNORMAL LOW (ref >60)
Glucose, Bld: 100 mg/dL — ABNORMAL HIGH (ref 70–99)
Potassium: 4.9 mmol/L (ref 3.5–5.1)
Sodium: 142 mmol/L (ref 135–145)

## 2024-02-25 LAB — CBC
HCT: 30.5 % — ABNORMAL LOW (ref 36.0–46.0)
Hemoglobin: 9.7 g/dL — ABNORMAL LOW (ref 12.0–15.0)
MCH: 31.1 pg (ref 26.0–34.0)
MCHC: 31.8 g/dL (ref 30.0–36.0)
MCV: 97.8 fL (ref 80.0–100.0)
Platelets: 220 K/uL (ref 150–400)
RBC: 3.12 MIL/uL — ABNORMAL LOW (ref 3.87–5.11)
RDW: 15.7 % — ABNORMAL HIGH (ref 11.5–15.5)
WBC: 10.7 K/uL — ABNORMAL HIGH (ref 4.0–10.5)
nRBC: 0 % (ref 0.0–0.2)

## 2024-02-25 LAB — RESPIRATORY PANEL BY PCR

## 2024-02-25 LAB — VITAMIN B12: Vitamin B-12: 279 pg/mL (ref 180–914)

## 2024-02-25 LAB — TSH: TSH: 2.57 u[IU]/mL (ref 0.350–4.500)

## 2024-02-25 LAB — MAGNESIUM: Magnesium: 2.2 mg/dL (ref 1.7–2.4)

## 2024-02-25 LAB — PHOSPHORUS: Phosphorus: 3.9 mg/dL (ref 2.5–4.6)

## 2024-02-25 NOTE — Progress Notes (Signed)
°   02/25/24 1211  Chest Physiotherapy Tx  CPT Delivery Source Flutter valve  $ Flutter Administration Initial  $ Flutter Green Yes  CPT Duration (S)   (Pt pleasantly confused and did not follow instructions after RT encouraged use, placed on the bedside table, notified RN of the situation.)  Post-Treatment Pulse 78  Cough  (none)  Position Supine  CPT Treatment Tolerance  (Pt is not able to perform.)

## 2024-02-25 NOTE — Plan of Care (Signed)
°  Problem: Clinical Measurements: Goal: Ability to maintain clinical measurements within normal limits will improve Outcome: Progressing Goal: Cardiovascular complication will be avoided Outcome: Progressing   Problem: Activity: Goal: Risk for activity intolerance will decrease Outcome: Progressing   Problem: Nutrition: Goal: Adequate nutrition will be maintained Outcome: Progressing   Problem: Coping: Goal: Level of anxiety will decrease Outcome: Progressing   Problem: Elimination: Goal: Will not experience complications related to bowel motility Outcome: Progressing Goal: Will not experience complications related to urinary retention Outcome: Progressing   Problem: Pain Managment: Goal: General experience of comfort will improve and/or be controlled Outcome: Progressing   Problem: Safety: Goal: Ability to remain free from injury will improve Outcome: Progressing   Problem: Skin Integrity: Goal: Risk for impaired skin integrity will decrease Outcome: Progressing

## 2024-02-25 NOTE — Progress Notes (Signed)
 PROGRESS NOTE  TABITHIA STRODER  FMW:[id number] DOB: 11/09/1924 DOA: 02/24/2024 PCP: Mast, Man X, NP   Brief Narrative: Patient is a 89 year old female with history of dementia, gout, hypertension, hypothyroidism, peripheral neuropathy, GERD, CHF, CKD stage IIIa, CVA, depression who presented with confusion, generalized weakness, poor oral intake from home.  Patient has poor short-term memory at baseline.  On presentation, she was hemodynamically stable.  Lab work showed creatinine of 1.2, WBC count of 8.8.  Respiratory viral panel negative.  UA without signs of infection.  Chest x-Grumbine showed cardiomegaly but no active disease.  Assessment & Plan:  Principal Problem:   Acute encephalopathy   Acute encephalopathy: History of dementia.  Brought because she was more confused at baseline.  Unclear etiology.  Could be from viral illness.  CT head unremarkable.  UA without signs of infection.  Chest x-Hubers did not show pneumonia.  Pending vitamin B12, TSH.  Continue delirium precaution, frequent reorientation. May need to do MRI of the brain if no improvement in the mentation.  Currently she is oriented to self  Suspected acute bronchitis/wheezing/positive rhinovirus/enterovirus: Found to be wheezing on presentation, cough and congestion.  Chest x-Nulty does not show any pneumonia.  Respiratory viral panel showed rhinovirus/enterovirus.  Started on DuoNeb, Mucinex .  Continue incentive spirometer, flutter valve.  Currently she is on room air.  Hypertension: Continue benazepril .  Blood pressure stable right now.  History of CHF: No echo on file.  Appears euvolemic.  On Lasix  twice a week  CKD stage IIIb: Baseline creatinine of 1.3-1.5.  Currently kidney function at baseline.  History of CVA: Takes aspirin   Hypothyroidism: On Synthyroid  GERD: Continue PPI  History of gout: Continue allopurinol   Generalized weakness: [facility].  PT/OT consulted.  At baseline, she ambulates  with assistance  Peripheral neuropathy: On gabapentin  at home.  Restarted here        DVT prophylaxis:heparin  injection 5,000 Units Start: 02/24/24 2200     Code Status: Limited: Do not attempt resuscitation (DNR) -DNR-LIMITED -Do Not Intubate/DNI   Family Communication: Called and discussed with son Lynwood on phone on 12/18  Patient status: Inpatient  Patient is from : Home  Anticipated discharge to: Home  Estimated DC date:1-2 days   Consultants: None  Procedures:None  Antimicrobials:  Anti-infectives (From admission, onward)    None       Subjective: Patient seen and examined at bedside.  Lying on bed.  Looks comfortable.  Very hard of hearing.  Not in any kind of distress.  Not agitated.  Obeys commands.  Knows that she is at [facility].  Not oriented to time.  No focal deficits.  Objective: Vitals:   02/24/24 1830 02/24/24 2030 02/24/24 2245 02/25/24 0701  BP: (!) 123/58 124/87  114/88  Pulse: 77 92 82 90  Resp: 14 16 16 16   Temp:  97.9 F (36.6 C) 98.2 F (36.8 C) 98.6 F (37 C)  TempSrc:  Oral Oral Oral  SpO2: 100% 96% 96% 95%  Weight:      Height:        Intake/Output Summary (Last 24 hours) at 02/25/2024 9191 Last data filed at 02/25/2024 0600 Gross per 24 hour  Intake 757.68 ml  Output --  Net 757.68 ml   Filed Weights   02/24/24 1652  Weight: 74 kg    Examination:  General exam: Overall comfortable, not in distress HEENT: PERRL, hard of hearing Respiratory system:  no wheezes or crackles  Cardiovascular  system: S1 & S2 heard, RRR.  Gastrointestinal system: Abdomen is nondistended, soft and nontender. Central nervous system: Alert and awake, oriented to place only Extremities: No edema, no clubbing ,no cyanosis Skin: No rashes, no ulcers,no icterus     Data Reviewed: I have personally reviewed following labs and imaging studies  CBC: Recent Labs  Lab 02/24/24 1718  WBC 8.8  NEUTROABS 6.6  HGB 9.9*  HCT 32.3*  MCV  100.9*  PLT 196   Basic Metabolic Panel: Recent Labs  Lab 02/24/24 1718  NA 142  K 4.2  CL 106  CO2 26  GLUCOSE 112*  BUN 27*  CREATININE 1.28*  CALCIUM  9.5     Recent Results (from the past 240 hours)  Resp panel by RT-PCR (RSV, Flu A&B, Covid) Anterior Nasal Swab     Status: None   Collection Time: 02/24/24  5:43 PM   Specimen: Anterior Nasal Swab  Result Value Ref Range Status   SARS Coronavirus 2 by RT PCR NEGATIVE NEGATIVE Final    Comment: (NOTE) SARS-CoV-2 target nucleic acids are NOT DETECTED.  The SARS-CoV-2 RNA is generally detectable in upper respiratory specimens during the acute phase of infection. The lowest concentration of SARS-CoV-2 viral copies this assay can detect is 138 copies/mL. A negative result does not preclude SARS-Cov-2 infection and should not be used as the sole basis for treatment or other patient management decisions. A negative result may occur with  improper specimen collection/handling, submission of specimen other than nasopharyngeal swab, presence of viral mutation(s) within the areas targeted by this assay, and inadequate number of viral copies(<138 copies/mL). A negative result must be combined with clinical observations, patient history, and epidemiological information. The expected result is Negative.  Fact Sheet for Patients:  bloggercourse.com  Fact Sheet for Healthcare Providers:  seriousbroker.it  This test is no t yet approved or cleared by the [place]  FDA and  has been authorized for detection and/or diagnosis of SARS-CoV-2 by FDA under an Emergency Use Authorization (EUA). This EUA will remain  in effect (meaning this test can be used) for the duration of the COVID-19 declaration under Section 564(b)(1) of the Act, 21 U.S.C.section 360bbb-3(b)(1), unless the authorization is terminated  or revoked sooner.       Influenza A by PCR NEGATIVE NEGATIVE Final    Influenza B by PCR NEGATIVE NEGATIVE Final    Comment: (NOTE) The Xpert Xpress SARS-CoV-2/FLU/RSV plus assay is intended as an aid in the diagnosis of influenza from Nasopharyngeal swab specimens and should not be used as a sole basis for treatment. Nasal washings and aspirates are unacceptable for Xpert Xpress SARS-CoV-2/FLU/RSV testing.  Fact Sheet for Patients: bloggercourse.com  Fact Sheet for Healthcare Providers: seriousbroker.it  This test is not yet approved or cleared by the [place]  FDA and has been authorized for detection and/or diagnosis of SARS-CoV-2 by FDA under an Emergency Use Authorization (EUA). This EUA will remain in effect (meaning this test can be used) for the duration of the COVID-19 declaration under Section 564(b)(1) of the Act, 21 U.S.C. section 360bbb-3(b)(1), unless the authorization is terminated or revoked.     Resp Syncytial Virus by PCR NEGATIVE NEGATIVE Final    Comment: (NOTE) Fact Sheet for Patients: bloggercourse.com  Fact Sheet for Healthcare Providers: seriousbroker.it  This test is not yet approved or cleared by the [place]  FDA and has been authorized for detection and/or diagnosis of SARS-CoV-2 by FDA under an Emergency Use Authorization (EUA). This EUA will remain in  effect (meaning this test can be used) for the duration of the COVID-19 declaration under Section 564(b)(1) of the Act, 21 U.S.C. section 360bbb-3(b)(1), unless the authorization is terminated or revoked.  Performed at [facility], [address], [place], [place] [address]   Respiratory (~20 pathogens) panel by PCR     Status: Abnormal   Collection Time: 02/24/24  5:43 PM   Specimen: Nasopharyngeal Swab; Respiratory  Result Value Ref Range Status   Adenovirus NOT DETECTED NOT DETECTED Final   Coronavirus 229E NOT DETECTED NOT DETECTED  Final    Comment: (NOTE) The Coronavirus on the Respiratory Panel, DOES NOT test for the novel  Coronavirus (2019 nCoV)    Coronavirus HKU1 NOT DETECTED NOT DETECTED Final   Coronavirus NL63 NOT DETECTED NOT DETECTED Final   Coronavirus OC43 NOT DETECTED NOT DETECTED Final   Metapneumovirus NOT DETECTED NOT DETECTED Final   Rhinovirus / Enterovirus DETECTED (A) NOT DETECTED Final   Influenza A NOT DETECTED NOT DETECTED Final   Influenza B NOT DETECTED NOT DETECTED Final   Parainfluenza Virus 1 NOT DETECTED NOT DETECTED Final   Parainfluenza Virus 2 NOT DETECTED NOT DETECTED Final   Parainfluenza Virus 3 NOT DETECTED NOT DETECTED Final   Parainfluenza Virus 4 NOT DETECTED NOT DETECTED Final   Respiratory Syncytial Virus NOT DETECTED NOT DETECTED Final   Bordetella pertussis NOT DETECTED NOT DETECTED Final   Bordetella Parapertussis NOT DETECTED NOT DETECTED Final   Chlamydophila pneumoniae NOT DETECTED NOT DETECTED Final   Mycoplasma pneumoniae NOT DETECTED NOT DETECTED Final    Comment: Performed at [facility], [address], [place], [place] [address]     Radiology Studies: CT HEAD WO CONTRAST ( ) Result Date: 02/24/2024 EXAM: CT HEAD WITHOUT CONTRAST 02/24/2024 05:46:17 PM TECHNIQUE: CT of the head was performed without the administration of intravenous contrast. Automated exposure control, iterative reconstruction, and/or weight based adjustment of the mA/kV was utilized to reduce the radiation dose to as low as reasonably achievable. COMPARISON: 07/27/2023 CLINICAL HISTORY: Mental status change, unknown cause. FINDINGS: BRAIN AND VENTRICLES: No acute hemorrhage. No evidence of acute infarct. No hydrocephalus. No extra-axial collection. No mass effect or midline shift. Stable moderate generalized cerebral volume loss. Stable moderate diffuse periventricular and deep cerebral white matter disease. Stable chronic encephalomalacia in left parietal and bilateral occipital lobes.  Moderate calcific atheromatous disease within the carotid siphons. ORBITS: Status post bilateral cataract surgery. No acute abnormality. SINUSES: Moderate mucosal disease within ethmoid air cells. SOFT TISSUES AND SKULL: No acute soft tissue abnormality. No skull fracture. LIMITATIONS/ARTIFACTS: Motion artifact limits examination. IMPRESSION: 1. No acute intracranial abnormality, within the limits of motion artifact. 2. Stable moderate generalized cerebral volume loss, diffuse periventricular and deep cerebral white matter disease. 3. Chronic encephalomalacia in left parietal and bilateral occipital lobes. Electronically signed by: Lonni Necessary MD 02/24/2024 05:53 PM EST RP Workstation: [id number]   DG Chest Port 1 View Result Date: 02/24/2024 CLINICAL DATA:  SOB cough EXAM: PORTABLE CHEST - 1 VIEW COMPARISON:  11/26/2020 FINDINGS: Lower lung volumes with elevation of the right hemidiaphragm. No focal airspace consolidation, pleural effusion, or pneumothorax. Mild cardiomegaly. Tortuous aorta with aortic atherosclerosis. No acute fracture or destructive lesions. Multilevel thoracic osteophytosis. IMPRESSION: Low lung volumes and mild cardiomegaly. Otherwise, no acute cardiopulmonary abnormality. Electronically Signed   By: Rogelia Myers M.D.   On: 02/24/2024 17:33    Scheduled Meds:  allopurinol   100 mg Oral Daily   aspirin   81 mg Oral Daily  benazepril   5 mg Oral Daily   calcium -vitamin D   2 tablet Oral Daily   cholecalciferol   2,000 Units Oral Daily   dextromethorphan -guaiFENesin   1 tablet Oral BID   docusate sodium   100 mg Oral BID   furosemide   20 mg Oral Once per day on Monday Thursday   gabapentin   400 mg Oral QHS   heparin   5,000 Units Subcutaneous Q8H   [START ON 03/02/2024] levothyroxine   100 mcg Oral Weekly   levothyroxine   50 mcg Oral Once per day on Sunday Monday Tuesday Thursday Friday Saturday   memantine   5 mg Oral BID   pantoprazole   40 mg Oral Daily   potassium  chloride SA  20 mEq Oral Once per day on Monday Thursday   Continuous Infusions:  lactated ringers  100 mL/hr at 02/24/24 2224     LOS: 1 day   Ivonne Mustache, MD [facility] P12/18/2025, 8:08 AM

## 2024-02-25 NOTE — [place] FL2 (Signed)
 [place]  MEDICAID FL2 LEVEL OF CARE FORM     IDENTIFICATION  Patient Name: [name] Birthdate: 02/22/1925 Sex: female Admission Date (Current Location): 02/24/2024  [facility] and [place] Number:  Producer, Television/film/video and Address:  [facility],  [address], [place] [address]      Provider Number: [id number]  Attending Physician Name and Address:  Jillian Buttery, MD  Relative Name and Phone Number:  Ubaldo Silvan (HCPOA) [phone]    Current Level of Care: Hospital Recommended Level of Care: Skilled Nursing Facility Prior Approval Number:    Date Approved/Denied:   PASRR Number: [id number]  Discharge Plan: SNF    Current Diagnoses: Patient Active Problem List   Diagnosis Date Noted   Acute encephalopathy 02/24/2024   Hemorrhoids 10/12/2023   GERD (gastroesophageal reflux disease) 10/12/2023   Acute bronchitis 08/14/2023   Altered mental status, unspecified 07/27/2023   Generalized weakness 02/10/2023   Pneumonia 05/06/2022   Congestive heart failure (CHF) (HCC) 10/29/2021   Abnormal chest x-[place] 06/11/2021   COVID-19 virus infection 03/18/2021   Edema, peripheral 02/15/2021   Seizure-like activity (HCC) 11/24/2020   Leukocytosis 11/24/2020   Hypertensive urgency 11/24/2020   History of stroke 11/24/2020   Hypokalemia 11/24/2020   Hypothyroidism 11/24/2020   DNR (do not resuscitate) 11/24/2020   Generalized osteoarthritis of multiple sites 10/24/2020   History of TIAs 08/03/2020   Dizziness 08/03/2020   DDD (degenerative disc disease), cervical 08/03/2020   Mid back pain 08/02/2020   Nausea & vomiting 08/02/2020   History of CVA (cerebrovascular accident) 08/02/2020   Fall 01/09/2020   Major neurocognitive disorder (HCC) 01/09/2020   CKD (chronic kidney disease) stage 3, GFR 30-59 ml/min (HCC) 07/27/2019   Gait instability 07/19/2019   Weight gain 03/16/2019   HTN (hypertension) 10/08/2018   Gout 10/08/2018   Peripheral  neuropathy 10/08/2018    Orientation RESPIRATION BLADDER Height & Weight     Self  Normal Continent Weight: 74 kg Height:  5' 5 (165.1 cm)  BEHAVIORAL SYMPTOMS/MOOD NEUROLOGICAL BOWEL NUTRITION STATUS      Continent Diet (regular)  AMBULATORY STATUS COMMUNICATION OF NEEDS Skin   Limited Assist Verbally Other (Comment)                       Personal Care Assistance Level of Assistance  Bathing, Feeding, Dressing Bathing Assistance: Limited assistance Feeding assistance: Limited assistance Dressing Assistance: Limited assistance     Functional Limitations Info  Sight, Hearing, Speech Sight Info: Adequate Hearing Info: Impaired (hard of hearing) Speech Info: Adequate    SPECIAL CARE FACTORS FREQUENCY  PT (By licensed PT), OT (By licensed OT)     PT Frequency: 5x/wk OT Frequency: 5x/wk            Contractures Contractures Info: Not present    Additional Factors Info  Code Status, Allergies, Psychotropic Code Status Info: DNR Allergies Info: Actonel (Risedronate), Hydrochlorothiazide, Lipitor (Atorvastatin), Neomycin, Polysporin (Bacitracin-polymyxin B), Prolia  (Denosumab ), Sulfa Antibiotics, Zocor (Simvastatin) Psychotropic Info: N/A         Current Medications (02/25/2024):  This is the current hospital active medication list Current Facility-Administered Medications  Medication Dose Route Frequency Provider Last Rate Last Admin   acetaminophen  (TYLENOL ) tablet 650 mg  650 mg Oral Q6H PRN Amponsah, Prosper M, MD   650 mg at 02/25/24 9071   Or   acetaminophen  (TYLENOL ) suppository 650 mg  650 mg Rectal Q6H PRN Lou Claretta HERO, MD  allopurinol  (ZYLOPRIM ) tablet 100 mg  100 mg Oral Daily Amponsah, Prosper M, MD   100 mg at 02/25/24 9071   aspirin  chewable tablet 81 mg  81 mg Oral Daily Amponsah, Prosper M, MD   81 mg at 02/25/24 9072   benazepril  (LOTENSIN ) tablet 5 mg  5 mg Oral Daily Amponsah, Prosper M, MD   5 mg at 02/25/24 9072   bisacodyl   (DULCOLAX) EC tablet 5 mg  5 mg Oral Daily PRN Amponsah, Prosper M, MD       calcium -vitamin D  (OSCAL WITH D) 500-5 MG-MCG per tablet 2 tablet  2 tablet Oral Daily Lou Claretta HERO, MD   2 tablet at 02/25/24 9072   cholecalciferol  (VITAMIN D3) 25 MCG (1000 UNIT) tablet 2,000 Units  2,000 Units Oral Daily Amponsah, Prosper M, MD   2,000 Units at 02/25/24 9072   dextromethorphan -guaiFENesin  (MUCINEX  DM) 30-600 MG per 12 hr tablet 1 tablet  1 tablet Oral BID Lou Claretta HERO, MD   1 tablet at 02/25/24 9072   docusate sodium  (COLACE) capsule 100 mg  100 mg Oral BID Amponsah, Prosper M, MD   100 mg at 02/25/24 9072   furosemide  (LASIX ) tablet 20 mg  20 mg Oral Once per day on Monday Thursday Lou Claretta HERO, MD   20 mg at 02/25/24 9071   gabapentin  (NEURONTIN ) capsule 400 mg  400 mg Oral QHS Amponsah, Prosper M, MD       heparin  injection 5,000 Units  5,000 Units Subcutaneous Q8H Lou Claretta HERO, MD   5,000 Units at 02/24/24 2227   ipratropium-albuterol  (DUONEB) 0.5-2.5 (3) MG/3ML nebulizer solution 3 mL  3 mL Nebulization Q6H PRN Lou Claretta HERO, MD   3 mL at 02/24/24 2121   [START ON 03/02/2024] levothyroxine  (SYNTHROID ) tablet 100 mcg  100 mcg Oral Weekly Lou Claretta HERO, MD       levothyroxine  (SYNTHROID ) tablet 50 mcg  50 mcg Oral Once per day on Sunday Monday Tuesday Thursday Friday Saturday Lou Claretta HERO, MD   50 mcg at 02/25/24 9352   memantine  (NAMENDA ) tablet 5 mg  5 mg Oral BID Amponsah, Prosper M, MD   5 mg at 02/25/24 9071   ondansetron  (ZOFRAN ) tablet 4 mg  4 mg Oral Q6H PRN Amponsah, Prosper M, MD       Or   ondansetron  (ZOFRAN ) injection 4 mg  4 mg Intravenous Q6H PRN Lou Claretta HERO, MD       pantoprazole  (PROTONIX ) EC tablet 40 mg  40 mg Oral Daily Amponsah, Prosper M, MD   40 mg at 02/25/24 9071   potassium chloride  SA (KLOR-CON  M) CR tablet 20 mEq  20 mEq Oral Once per day on Monday Thursday Lou Claretta HERO, MD   20 mEq at 02/25/24 9072    senna-docusate (Senokot-S) tablet 1 tablet  1 tablet Oral QHS PRN Amponsah, Prosper M, MD         Discharge Medications: Please see discharge summary for a list of discharge medications.  Relevant Imaging Results:  Relevant Lab Results:   Additional Information SSN: [id number]  Alfonse JONELLE Rex, RN

## 2024-02-25 NOTE — TOC Initial Note (Addendum)
 Transition of Care [facility]) - Initial/Assessment Note    Patient Details  Name: [name] MRN: [id number] Date of Birth: [birth year]  [facility] of [facility] CM/SW Contact:    Alfonse JONELLE Rex, RN Phone Number: 02/25/2024, 1:28 PM  Clinical Narrative:   Admitted from [facility] Consult - [facility].  Her mentation looks at baseline.  We might send her tomorrow  . NCM called to [facility], sw Alan, confirmed patient currently a resident in rehab, is a LTC resident of [facility]. Alan states will need updated FL2 and PASRR for rehab. Anticipate dc tomorrow.                 Expected Discharge Plan: Skilled Nursing Facility Barriers to Discharge: Continued Medical Work up   Patient Goals and CMS Choice Patient states their goals for this hospitalization and ongoing recovery are:: return to [facility] CMS Medicare.gov Compare Post Acute Care list provided to:: Patient Represenative (must comment) Fidela Silvan [facility]), [phone]) Choice offered to / list presented to : [facility] POA / Guardian Fidela Silvan (HCPOA), [phone]) [place] ownership interest in [facility].provided to:: [facility] POA / Guardian Fidela Silvan [facility]), [phone])    Expected Discharge Plan and Services       Living arrangements for the past 2 months: Skilled Nursing Facility ([facility])                                      Prior Living Arrangements/Services Living arrangements for the past 2 months: Skilled Nursing Facility ([facility]) Lives with:: Self Patient language and need for interpreter reviewed:: Yes        Need for Family Participation in Patient Care: Yes (Comment) Care giver support system in place?: Yes (comment)   Criminal Activity/Legal Involvement Pertinent to Current Situation/Hospitalization: No - Comment as needed  Activities of Daily Living   ADL Screening (condition at time of  admission) Independently performs ADLs?: No Does the patient have a NEW difficulty with bathing/dressing/toileting/self-feeding that is expected to last >3 days?: Yes (Initiates electronic notice to provider for possible OT consult) Does the patient have a NEW difficulty with getting in/out of bed, walking, or climbing stairs that is expected to last >3 days?: Yes (Initiates electronic notice to provider for possible PT consult) Does the patient have a NEW difficulty with communication that is expected to last >3 days?: Yes (Initiates electronic notice to provider for possible SLP consult) Is the patient deaf or have difficulty hearing?: Yes Does the patient have difficulty seeing, even when wearing glasses/contacts?: Yes Does the patient have difficulty concentrating, remembering, or making decisions?: Yes  Permission Sought/Granted                  Emotional Assessment         Alcohol / Substance Use: Not Applicable Psych Involvement: No (comment)  Admission diagnosis:  Wheezing [R06.2] Acute encephalopathy [G93.40] Altered mental status, unspecified altered mental status type [R41.82] Patient Active Problem List   Diagnosis Date Noted   Acute encephalopathy 02/24/2024   Hemorrhoids 10/12/2023   GERD (gastroesophageal reflux disease) 10/12/2023   Acute bronchitis 08/14/2023   Altered mental status, unspecified 07/27/2023   Generalized weakness 02/10/2023   Pneumonia 05/06/2022   Congestive heart failure (CHF) (HCC) 10/29/2021   Abnormal chest x-[place] 06/11/2021  COVID-19 virus infection 03/18/2021   Edema, peripheral 02/15/2021   Seizure-like activity (HCC) 11/24/2020   Leukocytosis 11/24/2020   Hypertensive urgency 11/24/2020   History of stroke 11/24/2020   Hypokalemia 11/24/2020   Hypothyroidism 11/24/2020   DNR (do not resuscitate) 11/24/2020   Generalized osteoarthritis of multiple sites 10/24/2020   History of TIAs 08/03/2020   Dizziness 08/03/2020   DDD  (degenerative disc disease), cervical 08/03/2020   Mid back pain 08/02/2020   Nausea & vomiting 08/02/2020   History of CVA (cerebrovascular accident) 08/02/2020   Fall 01/09/2020   Major neurocognitive disorder (HCC) 01/09/2020   CKD (chronic kidney disease) stage 3, GFR 30-59 ml/min (HCC) 07/27/2019   Gait instability 07/19/2019   Weight gain 03/16/2019   HTN (hypertension) 10/08/2018   Gout 10/08/2018   Peripheral neuropathy 10/08/2018   PCP:  Mast, Man X, NP Pharmacy:   [facility] [place], [place] - [address] [place] [address] Phone: [phone] Fax: [phone]     Social Drivers of Health (SDOH) Social History: SDOH Screenings   Depression (PHQ2-9): Low Risk (07/06/2023)  Tobacco Use: Low Risk (02/25/2024)   SDOH Interventions:     Readmission Risk Interventions    02/25/2024    1:24 PM  Readmission Risk Prevention Plan  Transportation Screening Complete  PCP or Specialist Appt within 5-7 Days Complete  Home Care Screening Complete  Medication Review (RN CM) Complete

## 2024-02-25 NOTE — Evaluation (Signed)
 Physical Therapy Evaluation Patient Details Name: [name] MRN: [id number] DOB: 1924-06-26 Today's Date: 02/25/2024  History of Present Illness  [name] is a 89 y.o. female with altered mental status and generalized weakness. Dx Acute encephalopathy and respiratory viral panel showed rhinovirus/enterovirus.   PMH: dementia, gout, HTN, hypothyroidism, peripheral neuropathy, GERD, CHF, CKD 3B, CVA and depression  Clinical Impression  PTA, patient lives as a resident of [facility] and as per chart was ambulating with AD.  Currently, patient presents with deficits outlined below (see PT Problem List for details) most significantly pain, generalized muscle weakness, decreased activity tolerance, balance, cognition and HOH limiting functional mobility (min-mod A +2 for safety) performance.  Patient will benefit from continued inpatient follow up therapy, <3 hours/day. Patient requires continued Acute care hospital level PT services to progress safety and functional performance and allow for discharge.         If plan is discharge home, recommend the following: A lot of help with walking and/or transfers;A little help with bathing/dressing/bathroom;Assistance with cooking/housework;Assist for transportation;Help with stairs or ramp for entrance   Can travel by private vehicle   No    Equipment Recommendations None recommended by PT  Recommendations for Other Services       Functional Status Assessment Patient has had a recent decline in their functional status and demonstrates the ability to make significant improvements in function in a reasonable and predictable amount of time.     Precautions / Restrictions Precautions Precautions: Fall Recall of Precautions/Restrictions: Impaired Restrictions Weight Bearing Restrictions Per Provider Order: No      Mobility  Bed Mobility Overal bed mobility: Needs Assistance Bed Mobility: Supine to Sit     Supine to sit: Mod assist,  +2 for physical assistance, +2 for safety/equipment, HOB elevated, Used rails     General bed mobility comments: as mobility progressed patient improved LOA to alert, use of bed pad to scoot to EOB with multimodal cues to follow for LE mngt and hand placement with bed features utilized    Transfers Overall transfer level: Needs assistance Equipment used: Rolling walker (2 wheels) Transfers: Sit to/from Stand, Bed to chair/wheelchair/BSC Sit to Stand: Min assist, Mod assist, +2 physical assistance, +2 safety/equipment, From elevated surface   Step pivot transfers: Min assist, Mod assist, +2 physical assistance, +2 safety/equipment       General transfer comment: multimodal cues for hand placement, directionality for SPT to and from bed to commode to recliner    Ambulation/Gait                  Stairs            Wheelchair Mobility     Tilt Bed    Modified Rankin (Stroke Patients Only)       Balance Overall balance assessment: Needs assistance Sitting-balance support: Feet supported Sitting balance-Leahy Scale: Fair Sitting balance - Comments: posterior lean initially then sat unchallenged   Standing balance support: Bilateral upper extremity supported, Reliant on assistive device for balance Standing balance-Leahy Scale: Poor                               Pertinent Vitals/Pain Pain Assessment Pain Assessment: Faces Faces Pain Scale: Hurts little more Pain Location: all over Pain Descriptors / Indicators: Aching, Guarding, Grimacing Pain Intervention(s): Limited activity within patient's tolerance, Monitored during session    Home Living Family/patient expects to be discharged to:: Skilled  nursing facility                   Additional Comments: HOH and cognition limits ability to provide history but chart review- patient resides to [facility] for LTC    Prior Function Prior Level of Function : Needs assist;Patient poor  historian/Family not available             Mobility Comments: chart revealed patient was ambulatory with RW ADLs Comments: staff assists but chart infers patient sub baseline currently     Extremity/Trunk Assessment   Upper Extremity Assessment Upper Extremity Assessment: Generalized weakness    Lower Extremity Assessment Lower Extremity Assessment: Generalized weakness    Cervical / Trunk Assessment Cervical / Trunk Assessment: Kyphotic  Communication   Communication Communication: Impaired Factors Affecting Communication: Hearing impaired    Cognition Arousal: Alert Behavior During Therapy: Impulsive, Flat affect   PT - Cognitive impairments: History of cognitive impairments                         Following commands: Impaired Following commands impaired: Follows one step commands inconsistently, Follows one step commands with increased time     Cueing Cueing Techniques: Verbal cues, Gestural cues, Tactile cues, Visual cues     General Comments General comments (skin integrity, edema, etc.): no skin issues, edema or SOB, mild coughing intermittently, weakness with standing for peri hygiene and transfers    Exercises     Assessment/Plan    PT Assessment Patient needs continued PT services  PT Problem List         PT Treatment Interventions DME instruction;Gait training;Functional mobility training;Therapeutic activities;Therapeutic exercise;Balance training;Patient/family education    PT Goals (Current goals can be found in the Care Plan section)  Acute Rehab PT Goals Patient Stated Goal: Get to chair PT Goal Formulation: Patient unable to participate in goal setting Time For Goal Achievement: 03/10/24 Potential to Achieve Goals: Fair    Frequency Min 2X/week     Co-evaluation PT/OT/SLP Co-Evaluation/Treatment: Yes Reason for Co-Treatment: For patient/therapist safety PT goals addressed during session: Mobility/safety with  mobility;Balance;Proper use of DME OT goals addressed during session: ADL's and self-care;Proper use of Adaptive equipment and DME       AM-PAC PT 6 Clicks Mobility  Outcome Measure Help needed turning from your back to your side while in a flat bed without using bedrails?: A Lot Help needed moving from lying on your back to sitting on the side of a flat bed without using bedrails?: A Lot Help needed moving to and from a bed to a chair (including a wheelchair)?: A Lot Help needed standing up from a chair using your arms (e.g., wheelchair or bedside chair)?: A Lot Help needed to walk in hospital room?: Total Help needed climbing 3-5 steps with a railing? : Total 6 Click Score: 10    End of Session Equipment Utilized During Treatment: Gait belt Activity Tolerance: Patient limited by fatigue Patient left: in chair;with call bell/phone within reach;with chair alarm set Nurse Communication: Mobility status PT Visit Diagnosis: Unsteadiness on feet (R26.81);Muscle weakness (generalized) (M62.81);Difficulty in walking, not elsewhere classified (R26.2);Pain Pain - part of body:  (all over)    Time: 8652-8587 PT Time Calculation (min) (ACUTE ONLY): 25 min   Charges:   PT Evaluation $PT Eval Low Complexity: 1 Low   PT General Charges $$ ACUTE PT VISIT: 1 Visit         Corwyn Vora PT Acute Rehabilitation Services  Office [phone]   Lateefah Mallery 02/25/2024, 4:52 PM

## 2024-02-25 NOTE — Evaluation (Signed)
 Occupational Therapy Evaluation Patient Details Name: [name] MRN: [id number] DOB: 1924/08/14 Today's Date: 02/25/2024   History of Present Illness   [name] is a 89 y.o. female with altered mental status and generalized weakness. Dx Acute encephalopathy and respiratory viral panel showed rhinovirus/enterovirus.   PMH: dementia, gout, HTN, hypothyroidism, peripheral neuropathy, GERD, CHF, CKD 3B, CVA and depression     Clinical Impressions PTA, patient lives as a resident of [facility] and as per chart was ambulating with AD, unsure exact level of BADL participation but currently performing below baseline.  Currently, patient presents with deficits outlined below (see OT Problem List for details) most significantly pain, generalized muscle weakness, decreased activity tolerance, balance, cognition and HOH limiting BADL's (max-total A LB) and functional mobility (min-mod A +2 for safety) performance.  Patient will benefit from continued inpatient follow up therapy, <3 hours/day. Patient requires continued Acute care hospital level OT services to progress safety and functional performance and allow for discharge.       If plan is discharge home, recommend the following:   Two people to help with walking and/or transfers;A lot of help with bathing/dressing/bathroom;Assistance with cooking/housework;Direct supervision/assist for medications management;Direct supervision/assist for financial management;Assist for transportation;Help with stairs or ramp for entrance;Supervision due to cognitive status     Functional Status Assessment   Patient has had a recent decline in their functional status and demonstrates the ability to make significant improvements in function in a reasonable and predictable amount of time.     Equipment Recommendations   None recommended by OT      Precautions/Restrictions   Precautions Precautions: Fall Recall of Precautions/Restrictions:  Impaired Restrictions Weight Bearing Restrictions Per Provider Order: No     Mobility Bed Mobility Overal bed mobility: Needs Assistance Bed Mobility: Supine to Sit     Supine to sit: Mod assist, +2 for physical assistance, +2 for safety/equipment, HOB elevated, Used rails     General bed mobility comments: as mobility progressed patient improved LOA to alert, use of bed pad to scoot to EOB with multimodal cues to follow for LE mngt and hand placement with bed features utilized    Transfers Overall transfer level: Needs assistance Equipment used: Rolling walker (2 wheels) Transfers: Sit to/from Stand, Bed to chair/wheelchair/BSC Sit to Stand: Min assist, Mod assist, +2 physical assistance, +2 safety/equipment, From elevated surface     Step pivot transfers: Min assist, Mod assist, +2 physical assistance, +2 safety/equipment     General transfer comment: multimodal cues for hand placement, directionality for SPT to and from bed to commode to recliner      Balance Overall balance assessment: Needs assistance Sitting-balance support: Feet supported Sitting balance-Leahy Scale: Fair Sitting balance - Comments: posterior lean initially then sat unchallenged   Standing balance support: Bilateral upper extremity supported, Reliant on assistive device for balance Standing balance-Leahy Scale: Poor                             ADL either performed or assessed with clinical judgement   ADL Overall ADL's : Needs assistance/impaired Eating/Feeding: Set up;Sitting   Grooming: Wash/dry hands;Wash/dry face;Brushing hair;Contact guard assist;Cueing for sequencing;Sitting   Upper Body Bathing: Sitting;Moderate assistance;Cueing for sequencing   Lower Body Bathing: Total assistance;Bed level   Upper Body Dressing : Moderate assistance;Cueing for sequencing;Sitting   Lower Body Dressing: Total assistance;Bed level   Toilet Transfer: Minimal assistance;Moderate  assistance;+2 for physical assistance;+2 for safety/equipment;BSC/3in1;Rolling  walker (2 wheels) Toilet Transfer Details (indicate cue type and reason): cues for steps of transfer safety Toileting- Clothing Manipulation and Hygiene: Total assistance;Sitting/lateral lean Toileting - Clothing Manipulation Details (indicate cue type and reason): post BM and voiding hygiene assist     Functional mobility during ADLs: Minimal assistance;Moderate assistance;Cueing for safety;Cueing for sequencing;Rolling walker (2 wheels) General ADL Comments: weakness and HOH limits performance     Vision Ability to See in Adequate Light: 1 Impaired Additional Comments: unable to fully assess due to [facility] and cognition but able to use items for feeding and grooming placed within site     Perception         Praxis         Pertinent Vitals/Pain Pain Assessment Pain Assessment: Faces Faces Pain Scale: Hurts little more Facial Expression: Grimacing Body Movements: Absence of movements Muscle Tension: Tense, rigid Compliance with ventilator (intubated pts.): N/A Vocalization (extubated pts.): Sighing, moaning CPOT Total: 4 Pain Location: all over Pain Descriptors / Indicators: Aching, Guarding, Grimacing Pain Intervention(s): Limited activity within patient's tolerance, Monitored during session, Repositioned     Extremity/Trunk Assessment Upper Extremity Assessment Upper Extremity Assessment: Generalized weakness;Right hand dominant   Lower Extremity Assessment Lower Extremity Assessment: Defer to PT evaluation   Cervical / Trunk Assessment Cervical / Trunk Assessment: Kyphotic   Communication Communication Communication: Impaired Factors Affecting Communication: Hearing impaired   Cognition Arousal: Alert Behavior During Therapy: Impulsive, Flat affect (no agitation during visit but decreased frustration tolerance) Cognition: History of cognitive impairments             OT -  Cognition Comments: severe HOH limits processing of information but is able to demonstrate sustained attention to basic tasks, decreased STM, problem solving,  poor safety, insight and judgement                 Following commands: Impaired Following commands impaired: Follows one step commands inconsistently, Follows one step commands with increased time (with repetition due to [facility] for basic commands)     Cueing  General Comments   Cueing Techniques: Verbal cues;Gestural cues;Tactile cues;Visual cues  no skin issues, edema or SOB, mild coughing intermittently, weakness with standing for peri hygiene and transfers           Home Living Family/patient expects to be discharged to:: Skilled nursing facility                                 Additional Comments: HOH and cognition limits ability to provide history but chart review- patient resides to [facility] for LTC      Prior Functioning/Environment Prior Level of Function : Needs assist;Patient poor historian/Family not available             Mobility Comments: chart revealed patient was ambulatory with RW ADLs Comments: staff assists but chart infers patient sub baseline currently    OT Problem List: Decreased strength;Decreased activity tolerance;Impaired balance (sitting and/or standing);Decreased cognition;Decreased safety awareness;Decreased knowledge of use of DME or AE;Decreased knowledge of precautions;Cardiopulmonary status limiting activity;Obesity;Impaired UE functional use;Pain   OT Treatment/Interventions: Self-care/ADL training;Therapeutic exercise;Neuromuscular education;DME and/or AE instruction;Therapeutic activities;Cognitive remediation/compensation;Patient/family education;Balance training      OT Goals(Current goals can be found in the care plan section)   Acute Rehab OT Goals Patient Stated Goal: unable to state OT Goal Formulation: Patient unable to participate in goal  setting Time For Goal Achievement: 03/10/24 Potential to Achieve Goals: Fair  ADL Goals Pt Will Perform Grooming: with set-up;sitting Pt Will Perform Upper Body Bathing: with contact guard assist;sitting Pt Will Perform Upper Body Dressing: with contact guard assist;sitting Pt Will Transfer to Toilet: with contact guard assist;bedside commode Pt Will Perform Toileting - Clothing Manipulation and hygiene: with min assist;sitting/lateral leans   OT Frequency:  Min 2X/week    Co-evaluation   Reason for Co-Treatment: For patient/therapist safety PT goals addressed during session: Mobility/safety with mobility;Balance;Proper use of DME OT goals addressed during session: ADL's and self-care;Proper use of Adaptive equipment and DME      AM-PAC OT 6 Clicks Daily Activity     Outcome Measure Help from another person eating meals?: A Little Help from another person taking care of personal grooming?: A Little Help from another person toileting, which includes using toliet, bedpan, or urinal?: A Lot Help from another person bathing (including washing, rinsing, drying)?: A Lot Help from another person to put on and taking off regular upper body clothing?: A Little Help from another person to put on and taking off regular lower body clothing?: Total 6 Click Score: 14   End of Session Equipment Utilized During Treatment: Gait belt;Rolling walker (2 wheels) Nurse Communication: Mobility status;Other (comment) (BM and voiding data entered in Flowsheets)  Activity Tolerance: Patient limited by fatigue Patient left: in chair;with call bell/phone within reach;with chair alarm set  OT Visit Diagnosis: Unsteadiness on feet (R26.81);Muscle weakness (generalized) (M62.81);Cognitive communication deficit (R41.841);Pain Pain - part of body:  (generalized body)                Time: 1340-1410 OT Time Calculation (min): 30 min Charges:  OT General Charges $OT Visit: 1 Visit OT Evaluation $OT Eval Low  Complexity: 1 Low  Anes Rigel OT/L Acute Rehabilitation Department  [phone]  02/25/2024, 4:28 PM

## 2024-02-26 DIAGNOSIS — G9340 Encephalopathy, unspecified: Secondary | ICD-10-CM | POA: Diagnosis not present

## 2024-02-26 MED ORDER — VITAMIN B-12 1000 MCG PO TABS
1000.0000 ug | ORAL_TABLET | Freq: Every day | ORAL | Status: DC
  Administered 2024-02-26: 1000 ug via ORAL
  Filled 2024-02-26: qty 1

## 2024-02-26 MED ORDER — CYANOCOBALAMIN 1000 MCG PO TABS: 1000.0000 ug | ORAL_TABLET | Freq: Every day | ORAL | Status: AC

## 2024-02-26 NOTE — Progress Notes (Signed)
 Pt discharged to facility in stable condition via PTAR

## 2024-02-26 NOTE — TOC Transition Note (Signed)
 Transition of Care [facility]) - Discharge Note   Patient Details  Name: [name] MRN: [id number] Date of Birth: 05-30-1924  [facility] CM/SW Contact:  Alfonse JONELLE Rex, RN Phone Number: 02/26/2024, 11:10 AM   Clinical Narrative:   DC to SNF-[facility]. RM 31, PTAR. Ubaldo Silvan [facility]), notified.  No further INPT CM needs identified at this time.     Final next level of care: Skilled Nursing Facility Barriers to Discharge: Barriers Resolved   Patient Goals and CMS Choice Patient states their goals for this hospitalization and ongoing recovery are:: return to [facility] SNF CMS Medicare.gov Compare Post Acute Care list provided to:: Patient Represenative (must comment) Fidela Silvan [facility]), [phone]) Choice offered to / list presented to : [facility] POA / Guardian Fidela Silvan (HCPOA), [phone]) [place] ownership interest in [facility].provided to:: [facility] POA / Guardian Fidela Silvan [facility]), [phone])    Discharge Placement              Patient chooses bed at: [facility] Patient to be transferred to facility by: PTAR Name of family member notified: Lynita, [place], Emergency Contact  [phone] [facility] Phone) Patient and family notified of of transfer: 02/26/24  Discharge Plan and Services Additional resources added to the After Visit Summary for                                       Social Drivers of Health (SDOH) Interventions SDOH Screenings   Depression [phone]): Low Risk (07/06/2023)  Tobacco Use: Low Risk (02/25/2024)     Readmission Risk Interventions    02/26/2024   11:10 AM 02/25/2024    1:24 PM  Readmission Risk Prevention Plan  Transportation Screening Complete Complete  PCP or Specialist Appt within 5-7 Days Complete Complete  Home Care Screening Complete Complete  Medication Review (RN CM) Complete Complete

## 2024-02-26 NOTE — Discharge Summary (Signed)
 Physician Discharge Summary  [name] FMW:[id number] DOB: 1924/09/21 DOA: 02/24/2024  PCP: Mast, Man X, NP  Admit date: 02/24/2024 Discharge date: 02/26/2024  Admitted From: SNF Disposition:  SNF  Discharge Condition:Stable CODE STATUS:DNR Diet recommendation:  Regular   Brief/Interim Summary: Patient is a 89 year old female with history of dementia, gout, hypertension, hypothyroidism, peripheral neuropathy, GERD, CHF, CKD stage IIIa, CVA, depression who presented with confusion, generalized weakness, poor oral intake from home.  Patient has poor short-term memory at baseline.  On presentation, she was hemodynamically stable.  Lab work showed creatinine of 1.2, WBC count of 8.8.   UA without signs of infection.  Chest x-Ney showed cardiomegaly but no active disease.  Respiratory viral panel showed rhinovirus/enterovirus. she is back to her baseline mentation.  Respiratory status stable.  Medically stable for discharge home today.  Following problems were addressed during the hospitalization:  Acute encephalopathy: History of dementia.  Brought because she was more confused at baseline.  Unclear etiology.  Could be from viral illness.  CT head unremarkable.  UA without signs of infection.  Chest x-Touchette did not show pneumonia.    Currently she is oriented to self which is her baseline.   Suspected acute bronchitis/wheezing/positive rhinovirus/enterovirus: Found to be wheezing on presentation, cough and congestion.  Chest x-Velardi does not show any pneumonia.  Respiratory viral panel showed rhinovirus/enterovirus.  Started on DuoNeb, Mucinex .  Currently she is on room air.  No shortness of breath or cough.  Low vitamin B12: Started on supplementation.  Continue supplementation at SNF   Hypertension: Continue benazepril .  Blood pressure stable right now.   History of CHF: No echo on file.  Appears euvolemic.  On Lasix  twice a week   CKD stage IIIb: Baseline creatinine of 1.3-1.5.  Currently  kidney function at baseline.   History of CVA: Takes aspirin    Hypothyroidism: On Synthyroid   GERD: Continue PPI   History of gout: Continue allopurinol    Generalized weakness: [facility].  PT/OT consulted.  At baseline, she ambulates with assistance   Peripheral neuropathy: On gabapentin       Discharge Diagnoses:  Principal Problem:   Acute encephalopathy    Discharge Instructions  Discharge Instructions     Diet general   Complete by: As directed    Discharge instructions   Complete by: As directed    1)Please take your medications as instructed 2)Follow up with your PCP in a week.   Increase activity slowly   Complete by: As directed       Allergies as of 02/26/2024       Reactions   Actonel [risedronate] Other (See Comments)   Allergic, per [facility]   Hydrochlorothiazide Other (See Comments)   Allergic, per [facility]   Lipitor [atorvastatin] Other (See Comments)   Allergic, per [facility]   Neomycin Other (See Comments)   Allergic, per [facility]   Polysporin [bacitracin-polymyxin B] Other (See Comments)   Allergic, per [facility]   Prolia  [denosumab ] Other (See Comments)   Allergic, per [facility]   Sulfa Antibiotics Other (See Comments)   Allergic, per [facility]   Zocor [simvastatin] Other (See Comments)   Allergic, per [facility]        Medication List     TAKE these medications    acetaminophen  325 MG tablet Commonly known as: TYLENOL  Take 650 mg by mouth with breakfast, with lunch, and with evening meal.   allopurinol  100 MG tablet Commonly known as: ZYLOPRIM  Take 100 mg by  mouth daily.   aspirin  81 MG chewable tablet Chew 81 mg by mouth daily.   benazepril  5 MG tablet Commonly known as: LOTENSIN  Take 5 mg by mouth daily.   Calcium  Carb-Cholecalciferol  500-400 MG-UNIT Tabs Take 2 tablets by mouth daily.   cyanocobalamin  1000 MCG tablet Take 1 tablet (1,000 mcg total) by mouth daily. Start taking on: February 27, 2024   Docusate  Sodium 100 MG capsule Take 100 mg by mouth 2 (two) times daily.   furosemide  20 MG tablet Commonly known as: LASIX  Take 20 mg by mouth See admin instructions. Take 20 mg by mouth at 9 AM on Mondays and Thursdays   gabapentin  400 MG capsule Commonly known as: NEURONTIN  Take 1 capsule (400 mg total) by mouth at bedtime.   hydrocortisone 2.5 % rectal cream Commonly known as: ANUSOL-HC Place 1 Application rectally 2 (two) times daily as needed for hemorrhoids (APPLY TOPICALLY).   levothyroxine  100 MCG tablet Commonly known as: SYNTHROID  Take 100 mcg by mouth See admin instructions. Take 100 mcg by mouth at 6 AM on Wednesdays ONLY   levothyroxine  50 MCG tablet Commonly known as: SYNTHROID  Take 50 mcg by mouth See admin instructions. Take 50 mcg by mouth at 6 AM on Sun/Mon/Tues/Thurs/Fri/Sat   memantine  10 MG tablet Commonly known as: NAMENDA  Take 5 mg by mouth in the morning and at bedtime.   omeprazole  20 MG tablet Commonly known as: PRILOSEC  OTC Take 20 mg by mouth daily before breakfast.   potassium chloride  SA 20 MEQ tablet Commonly known as: KLOR-CON  M Take 20 mEq by mouth See admin instructions. Take 20 mEq by mouth at 9 AM on Mondays and Thursdays   Vitamin D  50 MCG (2000 UT) Caps Take 2,000 Units by mouth daily.        Contact information for follow-up providers     Mast, Man X, NP. Schedule an appointment as soon as possible for a visit in 1 week(s).   Specialty: Internal Medicine Contact information: [address] [place] [address] [phone]              Contact information for after-discharge care     Destination     [facility] .   Service: Skilled Nursing Contact information: [address] [place]  [address] [phone]                    Allergies[1]  Consultations: None   Procedures/Studies: CT HEAD WO CONTRAST ( ) Result Date: 02/24/2024 EXAM: CT HEAD WITHOUT CONTRAST  02/24/2024 05:46:17 PM TECHNIQUE: CT of the head was performed without the administration of intravenous contrast. Automated exposure control, iterative reconstruction, and/or weight based adjustment of the mA/kV was utilized to reduce the radiation dose to as low as reasonably achievable. COMPARISON: 07/27/2023 CLINICAL HISTORY: Mental status change, unknown cause. FINDINGS: BRAIN AND VENTRICLES: No acute hemorrhage. No evidence of acute infarct. No hydrocephalus. No extra-axial collection. No mass effect or midline shift. Stable moderate generalized cerebral volume loss. Stable moderate diffuse periventricular and deep cerebral white matter disease. Stable chronic encephalomalacia in left parietal and bilateral occipital lobes. Moderate calcific atheromatous disease within the carotid siphons. ORBITS: Status post bilateral cataract surgery. No acute abnormality. SINUSES: Moderate mucosal disease within ethmoid air cells. SOFT TISSUES AND SKULL: No acute soft tissue abnormality. No skull fracture. LIMITATIONS/ARTIFACTS: Motion artifact limits examination. IMPRESSION: 1. No acute intracranial abnormality, within the limits of motion artifact. 2. Stable moderate generalized cerebral volume loss, diffuse periventricular and  deep cerebral white matter disease. 3. Chronic encephalomalacia in left parietal and bilateral occipital lobes. Electronically signed by: Lonni Necessary MD 02/24/2024 05:53 PM EST RP Workstation: [id number]   DG Chest Port 1 View Result Date: 02/24/2024 CLINICAL DATA:  SOB cough EXAM: PORTABLE CHEST - 1 VIEW COMPARISON:  11/26/2020 FINDINGS: Lower lung volumes with elevation of the right hemidiaphragm. No focal airspace consolidation, pleural effusion, or pneumothorax. Mild cardiomegaly. Tortuous aorta with aortic atherosclerosis. No acute fracture or destructive lesions. Multilevel thoracic osteophytosis. IMPRESSION: Low lung volumes and mild cardiomegaly. Otherwise, no acute  cardiopulmonary abnormality. Electronically Signed   By: Rogelia Myers M.D.   On: 02/24/2024 17:33      Subjective: Patient seen and examined at bedside today.  Very comfortable.  Sitting on the chair today.  Not observed to be coughing or short of breath.  Medically stable for discharge back to SNF.I called and discussed with her son Lynwood about discharge planning  Discharge Exam: Vitals:   02/25/24 2033 02/26/24 0658  BP: (!) 152/75 134/72  Pulse: 87 92  Resp: 17 18  Temp: 99.1 F (37.3 C) 97.6 F (36.4 C)  SpO2: 94% 92%   Vitals:   02/25/24 0701 02/25/24 0921 02/25/24 2033 02/26/24 0658  BP: 114/88 131/62 (!) 152/75 134/72  Pulse: 90 85 87 92  Resp: 16 20 17 18   Temp: 98.6 F (37 C) 99 F (37.2 C) 99.1 F (37.3 C) 97.6 F (36.4 C)  TempSrc: Oral Oral Oral Oral  SpO2: 95% 92% 94% 92%  Weight:      Height:        General: Pt is alert, awake, not in acute distress, pleasantly confused Cardiovascular: RRR, S1/S2 +, no rubs, no gallops Respiratory: CTA bilaterally, no wheezing, no rhonchi Abdominal: Soft, NT, ND, bowel sounds + Extremities: no edema, no cyanosis    The results of significant diagnostics from this hospitalization (including imaging, microbiology, ancillary and laboratory) are listed below for reference.     Microbiology: Recent Results (from the past 240 hours)  Resp panel by RT-PCR (RSV, Flu A&B, Covid) Anterior Nasal Swab     Status: None   Collection Time: 02/24/24  5:43 PM   Specimen: Anterior Nasal Swab  Result Value Ref Range Status   SARS Coronavirus 2 by RT PCR NEGATIVE NEGATIVE Final    Comment: (NOTE) SARS-CoV-2 target nucleic acids are NOT DETECTED.  The SARS-CoV-2 RNA is generally detectable in upper respiratory specimens during the acute phase of infection. The lowest concentration of SARS-CoV-2 viral copies this assay can detect is 138 copies/mL. A negative result does not preclude SARS-Cov-2 infection and should not be used as  the sole basis for treatment or other patient management decisions. A negative result may occur with  improper specimen collection/handling, submission of specimen other than nasopharyngeal swab, presence of viral mutation(s) within the areas targeted by this assay, and inadequate number of viral copies(<138 copies/mL). A negative result must be combined with clinical observations, patient history, and epidemiological information. The expected result is Negative.  Fact Sheet for Patients:  bloggercourse.com  Fact Sheet for Healthcare Providers:  seriousbroker.it  This test is no t yet approved or cleared by the [place]  FDA and  has been authorized for detection and/or diagnosis of SARS-CoV-2 by FDA under an Emergency Use Authorization (EUA). This EUA will remain  in effect (meaning this test can be used) for the duration of the COVID-19 declaration under Section 564(b)(1) of the Act, 21 U.S.C.section 360bbb-3(b)(1), unless the  authorization is terminated  or revoked sooner.       Influenza A by PCR NEGATIVE NEGATIVE Final   Influenza B by PCR NEGATIVE NEGATIVE Final    Comment: (NOTE) The Xpert Xpress SARS-CoV-2/FLU/RSV plus assay is intended as an aid in the diagnosis of influenza from Nasopharyngeal swab specimens and should not be used as a sole basis for treatment. Nasal washings and aspirates are unacceptable for Xpert Xpress SARS-CoV-2/FLU/RSV testing.  Fact Sheet for Patients: bloggercourse.com  Fact Sheet for Healthcare Providers: seriousbroker.it  This test is not yet approved or cleared by the [place]  FDA and has been authorized for detection and/or diagnosis of SARS-CoV-2 by FDA under an Emergency Use Authorization (EUA). This EUA will remain in effect (meaning this test can be used) for the duration of the COVID-19 declaration under Section 564(b)(1) of  the Act, 21 U.S.C. section 360bbb-3(b)(1), unless the authorization is terminated or revoked.     Resp Syncytial Virus by PCR NEGATIVE NEGATIVE Final    Comment: (NOTE) Fact Sheet for Patients: bloggercourse.com  Fact Sheet for Healthcare Providers: seriousbroker.it  This test is not yet approved or cleared by the [place]  FDA and has been authorized for detection and/or diagnosis of SARS-CoV-2 by FDA under an Emergency Use Authorization (EUA). This EUA will remain in effect (meaning this test can be used) for the duration of the COVID-19 declaration under Section 564(b)(1) of the Act, 21 U.S.C. section 360bbb-3(b)(1), unless the authorization is terminated or revoked.  Performed at [facility], [address] [place], [place] [address]   Respiratory (~20 pathogens) panel by PCR     Status: Abnormal   Collection Time: 02/24/24  5:43 PM   Specimen: Nasopharyngeal Swab; Respiratory  Result Value Ref Range Status   Adenovirus NOT DETECTED NOT DETECTED Final   Coronavirus 229E NOT DETECTED NOT DETECTED Final    Comment: (NOTE) The Coronavirus on the Respiratory Panel, DOES NOT test for the novel  Coronavirus (2019 nCoV)    Coronavirus HKU1 NOT DETECTED NOT DETECTED Final   Coronavirus NL63 NOT DETECTED NOT DETECTED Final   Coronavirus OC43 NOT DETECTED NOT DETECTED Final   Metapneumovirus NOT DETECTED NOT DETECTED Final   Rhinovirus / Enterovirus DETECTED (A) NOT DETECTED Final   Influenza A NOT DETECTED NOT DETECTED Final   Influenza B NOT DETECTED NOT DETECTED Final   Parainfluenza Virus 1 NOT DETECTED NOT DETECTED Final   Parainfluenza Virus 2 NOT DETECTED NOT DETECTED Final   Parainfluenza Virus 3 NOT DETECTED NOT DETECTED Final   Parainfluenza Virus 4 NOT DETECTED NOT DETECTED Final   Respiratory Syncytial Virus NOT DETECTED NOT DETECTED Final   Bordetella pertussis NOT DETECTED NOT DETECTED  Final   Bordetella Parapertussis NOT DETECTED NOT DETECTED Final   Chlamydophila pneumoniae NOT DETECTED NOT DETECTED Final   Mycoplasma pneumoniae NOT DETECTED NOT DETECTED Final    Comment: Performed at [facility], [address] [place], [place] [address]     Labs: BNP (last 3 results) No results for input(s): BNP in the last 8760 hours. Basic Metabolic Panel: Recent Labs  Lab 02/24/24 1718 02/25/24 1220  NA 142 142  K 4.2 4.9  CL 106 107  CO2 26 27  GLUCOSE 112* 100*  BUN 27* 25*  CREATININE 1.28* 1.26*  CALCIUM  9.5 9.5  MG  --  2.2  PHOS  --  3.9   Liver Function Tests: Recent Labs  Lab 02/24/24 1718  AST 18  ALT 8  ALKPHOS 123  BILITOT 0.3  PROT 6.9  ALBUMIN 3.8   No results for input(s): LIPASE, AMYLASE in the last 168 hours. No results for input(s): AMMONIA in the last 168 hours. CBC: Recent Labs  Lab 02/24/24 1718 02/25/24 1220  WBC 8.8 10.7*  NEUTROABS 6.6  --   HGB 9.9* 9.7*  HCT 32.3* 30.5*  MCV 100.9* 97.8  PLT 196 220   Cardiac Enzymes: No results for input(s): CKTOTAL, CKMB, CKMBINDEX, TROPONINI in the last 168 hours. BNP: Invalid input(s): POCBNP CBG: No results for input(s): GLUCAP in the last 168 hours. D-Dimer No results for input(s): DDIMER in the last 72 hours. Hgb A1c No results for input(s): HGBA1C in the last 72 hours. Lipid Profile No results for input(s): CHOL, HDL, LDLCALC, TRIG, CHOLHDL, LDLDIRECT in the last 72 hours. Thyroid  function studies Recent Labs    02/25/24 1220  TSH 2.570   Anemia work up Recent Labs    02/25/24 1220  VITAMINB12 279   Urinalysis    Component Value Date/Time   COLORURINE YELLOW 02/24/2024 1844   APPEARANCEUR HAZY (A) 02/24/2024 1844   LABSPEC 1.018 02/24/2024 1844   PHURINE 5.0 02/24/2024 1844   GLUCOSEU NEGATIVE 02/24/2024 1844   HGBUR NEGATIVE 02/24/2024 1844   BILIRUBINUR NEGATIVE 02/24/2024 1844   KETONESUR NEGATIVE 02/24/2024  1844   PROTEINUR NEGATIVE 02/24/2024 1844   UROBILINOGEN 0.2 07/14/2008 1233   NITRITE NEGATIVE 02/24/2024 1844   LEUKOCYTESUR NEGATIVE 02/24/2024 1844   Sepsis Labs Recent Labs  Lab 02/24/24 1718 02/25/24 1220  WBC 8.8 10.7*   Microbiology Recent Results (from the past 240 hours)  Resp panel by RT-PCR (RSV, Flu A&B, Covid) Anterior Nasal Swab     Status: None   Collection Time: 02/24/24  5:43 PM   Specimen: Anterior Nasal Swab  Result Value Ref Range Status   SARS Coronavirus 2 by RT PCR NEGATIVE NEGATIVE Final    Comment: (NOTE) SARS-CoV-2 target nucleic acids are NOT DETECTED.  The SARS-CoV-2 RNA is generally detectable in upper respiratory specimens during the acute phase of infection. The lowest concentration of SARS-CoV-2 viral copies this assay can detect is 138 copies/mL. A negative result does not preclude SARS-Cov-2 infection and should not be used as the sole basis for treatment or other patient management decisions. A negative result may occur with  improper specimen collection/handling, submission of specimen other than nasopharyngeal swab, presence of viral mutation(s) within the areas targeted by this assay, and inadequate number of viral copies(<138 copies/mL). A negative result must be combined with clinical observations, patient history, and epidemiological information. The expected result is Negative.  Fact Sheet for Patients:  bloggercourse.com  Fact Sheet for Healthcare Providers:  seriousbroker.it  This test is no t yet approved or cleared by the [place]  FDA and  has been authorized for detection and/or diagnosis of SARS-CoV-2 by FDA under an Emergency Use Authorization (EUA). This EUA will remain  in effect (meaning this test can be used) for the duration of the COVID-19 declaration under Section 564(b)(1) of the Act, 21 U.S.C.section 360bbb-3(b)(1), unless the authorization is terminated  or  revoked sooner.       Influenza A by PCR NEGATIVE NEGATIVE Final   Influenza B by PCR NEGATIVE NEGATIVE Final    Comment: (NOTE) The Xpert Xpress SARS-CoV-2/FLU/RSV plus assay is intended as an aid in the diagnosis of influenza from Nasopharyngeal swab specimens and should not be used as a sole basis for treatment. Nasal washings and aspirates are unacceptable  for Xpert Xpress SARS-CoV-2/FLU/RSV testing.  Fact Sheet for Patients: bloggercourse.com  Fact Sheet for Healthcare Providers: seriousbroker.it  This test is not yet approved or cleared by the [place]  FDA and has been authorized for detection and/or diagnosis of SARS-CoV-2 by FDA under an Emergency Use Authorization (EUA). This EUA will remain in effect (meaning this test can be used) for the duration of the COVID-19 declaration under Section 564(b)(1) of the Act, 21 U.S.C. section 360bbb-3(b)(1), unless the authorization is terminated or revoked.     Resp Syncytial Virus by PCR NEGATIVE NEGATIVE Final    Comment: (NOTE) Fact Sheet for Patients: bloggercourse.com  Fact Sheet for Healthcare Providers: seriousbroker.it  This test is not yet approved or cleared by the [place]  FDA and has been authorized for detection and/or diagnosis of SARS-CoV-2 by FDA under an Emergency Use Authorization (EUA). This EUA will remain in effect (meaning this test can be used) for the duration of the COVID-19 declaration under Section 564(b)(1) of the Act, 21 U.S.C. section 360bbb-3(b)(1), unless the authorization is terminated or revoked.  Performed at [facility], [address] [place], [place] [address]   Respiratory (~20 pathogens) panel by PCR     Status: Abnormal   Collection Time: 02/24/24  5:43 PM   Specimen: Nasopharyngeal Swab; Respiratory  Result Value Ref Range Status   Adenovirus NOT  DETECTED NOT DETECTED Final   Coronavirus 229E NOT DETECTED NOT DETECTED Final    Comment: (NOTE) The Coronavirus on the Respiratory Panel, DOES NOT test for the novel  Coronavirus (2019 nCoV)    Coronavirus HKU1 NOT DETECTED NOT DETECTED Final   Coronavirus NL63 NOT DETECTED NOT DETECTED Final   Coronavirus OC43 NOT DETECTED NOT DETECTED Final   Metapneumovirus NOT DETECTED NOT DETECTED Final   Rhinovirus / Enterovirus DETECTED (A) NOT DETECTED Final   Influenza A NOT DETECTED NOT DETECTED Final   Influenza B NOT DETECTED NOT DETECTED Final   Parainfluenza Virus 1 NOT DETECTED NOT DETECTED Final   Parainfluenza Virus 2 NOT DETECTED NOT DETECTED Final   Parainfluenza Virus 3 NOT DETECTED NOT DETECTED Final   Parainfluenza Virus 4 NOT DETECTED NOT DETECTED Final   Respiratory Syncytial Virus NOT DETECTED NOT DETECTED Final   Bordetella pertussis NOT DETECTED NOT DETECTED Final   Bordetella Parapertussis NOT DETECTED NOT DETECTED Final   Chlamydophila pneumoniae NOT DETECTED NOT DETECTED Final   Mycoplasma pneumoniae NOT DETECTED NOT DETECTED Final    Comment: Performed at [facility], [address], [place], [place] [address]    Please note: You were cared for by a hospitalist during your hospital stay. Once you are discharged, your primary care physician will handle any further medical issues. Please note that NO REFILLS for any discharge medications will be authorized once you are discharged, as it is imperative that you return to your primary care physician (or establish a relationship with a primary care physician if you do not have one) for your post hospital discharge needs so that they can reassess your need for medications and monitor your lab values.    Time coordinating discharge: 40 minutes  SIGNED:   Ivonne Mustache, MD  [facility] 02/26/2024, 10:09 AM Pager [phone]  If 7PM-7AM, please contact night-coverage [email or web address] Password [email or web address]    [1]   Allergies Allergen Reactions   Actonel [Risedronate] Other (See Comments)    Allergic, per [facility]   Hydrochlorothiazide Other (See Comments)    Allergic, per [facility]   Lipitor [Atorvastatin]  Other (See Comments)    Allergic, per [facility]   Neomycin Other (See Comments)    Allergic, per [facility]   Polysporin [Bacitracin-Polymyxin B] Other (See Comments)    Allergic, per [facility]   Prolia  [Denosumab ] Other (See Comments)    Allergic, per [facility]   Sulfa Antibiotics Other (See Comments)    Allergic, per [facility]   Zocor [Simvastatin] Other (See Comments)    Allergic, per [facility]

## 2024-02-26 NOTE — Plan of Care (Signed)
" °  Problem: Education: Goal: Knowledge of General Education information will improve Description: Including pain rating scale, medication(s)/side effects and non-pharmacologic comfort measures Outcome: Not Progressing   Problem: Health Behavior/Discharge Planning: Goal: Ability to manage health-related needs will improve Outcome: Not Progressing   Problem: Clinical Measurements: Goal: Ability to maintain clinical measurements within normal limits will improve Outcome: Not Progressing Goal: Will remain free from infection Outcome: Not Progressing   Problem: Nutrition: Goal: Adequate nutrition will be maintained Outcome: Not Progressing Note: Patient refusing to eat   "

## 2024-02-26 NOTE — Plan of Care (Signed)
" °  Problem: Education: Goal: Knowledge of General Education information will improve Description: Including pain rating scale, medication(s)/side effects and non-pharmacologic comfort measures Outcome: Adequate for Discharge   Problem: Health Behavior/Discharge Planning: Goal: Ability to manage health-related needs will improve Outcome: Adequate for Discharge   Problem: Clinical Measurements: Goal: Ability to maintain clinical measurements within normal limits will improve Outcome: Adequate for Discharge Goal: Will remain free from infection Outcome: Adequate for Discharge Goal: Diagnostic test results will improve Outcome: Adequate for Discharge Goal: Respiratory complications will improve Outcome: Adequate for Discharge Goal: Cardiovascular complication will be avoided Outcome: Adequate for Discharge   Problem: Activity: Goal: Risk for activity intolerance will decrease Outcome: Adequate for Discharge   Problem: Nutrition: Goal: Adequate nutrition will be maintained Outcome: Adequate for Discharge   Problem: Coping: Goal: Level of anxiety will decrease Outcome: Adequate for Discharge   Problem: Elimination: Goal: Will not experience complications related to bowel motility Outcome: Adequate for Discharge Goal: Will not experience complications related to urinary retention Outcome: Adequate for Discharge   Problem: Pain Managment: Goal: General experience of comfort will improve and/or be controlled Outcome: Adequate for Discharge   Problem: Safety: Goal: Ability to remain free from injury will improve Outcome: Adequate for Discharge   Problem: Skin Integrity: Goal: Risk for impaired skin integrity will decrease Outcome: Adequate for Discharge   Problem: Acute Rehab OT Goals (only OT should resolve) Goal: Pt. Will Perform Grooming Outcome: Adequate for Discharge Goal: Pt. Will Perform Upper Body Bathing Outcome: Adequate for Discharge Goal: Pt. Will Perform  Upper Body Dressing Outcome: Adequate for Discharge Goal: Pt. Will Transfer To Toilet Outcome: Adequate for Discharge Goal: Pt. Will Perform Toileting-Clothing Manipulation Outcome: Adequate for Discharge   Problem: Acute Rehab PT Goals(only PT should resolve) Goal: Pt Will Go Supine/Side To Sit Outcome: Adequate for Discharge Goal: Pt Will Go Sit To Supine/Side Outcome: Adequate for Discharge Goal: Patient Will Transfer Sit To/From Stand Outcome: Adequate for Discharge Goal: Pt Will Transfer Bed To Chair/Chair To Bed Outcome: Adequate for Discharge Goal: Pt Will Ambulate Outcome: Adequate for Discharge   "

## 2024-02-26 NOTE — Progress Notes (Signed)
 Report called to Almarie, LPN at facility. Awaiting PTAR for trnasport.

## 2024-03-09 ENCOUNTER — Non-Acute Institutional Stay (SKILLED_NURSING_FACILITY): Admitting: Nurse Practitioner

## 2024-03-09 ENCOUNTER — Encounter: Payer: Self-pay | Admitting: Nurse Practitioner

## 2024-03-09 DIAGNOSIS — I10 Essential (primary) hypertension: Secondary | ICD-10-CM

## 2024-03-09 DIAGNOSIS — K219 Gastro-esophageal reflux disease without esophagitis: Secondary | ICD-10-CM | POA: Diagnosis not present

## 2024-03-09 DIAGNOSIS — D6489 Other specified anemias: Secondary | ICD-10-CM

## 2024-03-09 DIAGNOSIS — M109 Gout, unspecified: Secondary | ICD-10-CM

## 2024-03-09 DIAGNOSIS — Z8673 Personal history of transient ischemic attack (TIA), and cerebral infarction without residual deficits: Secondary | ICD-10-CM | POA: Diagnosis not present

## 2024-03-09 DIAGNOSIS — G609 Hereditary and idiopathic neuropathy, unspecified: Secondary | ICD-10-CM | POA: Diagnosis not present

## 2024-03-09 DIAGNOSIS — R2681 Unsteadiness on feet: Secondary | ICD-10-CM

## 2024-03-09 DIAGNOSIS — F0390 Unspecified dementia without behavioral disturbance: Secondary | ICD-10-CM | POA: Diagnosis not present

## 2024-03-09 DIAGNOSIS — M159 Polyosteoarthritis, unspecified: Secondary | ICD-10-CM | POA: Diagnosis not present

## 2024-03-09 DIAGNOSIS — I509 Heart failure, unspecified: Secondary | ICD-10-CM | POA: Diagnosis not present

## 2024-03-09 NOTE — Assessment & Plan Note (Signed)
 Hx of TIA. Allergic to statin. On ASA.  HPOA desired no hospital evaluation in the past. ED 07/27/23 CT head/MR brain no acute intracranial changes.

## 2024-03-09 NOTE — Assessment & Plan Note (Signed)
 on Levothyroxine , TSH 2.57 02/25/24

## 2024-03-09 NOTE — Assessment & Plan Note (Signed)
 Peripheral neuropathy, taking Gabapentin , Vit B12

## 2024-03-09 NOTE — Assessment & Plan Note (Signed)
 Stable. Continue Allopurinol.  ?

## 2024-03-09 NOTE — Assessment & Plan Note (Signed)
 stable, on Omeprazole , Hgb 13 10/05/23>>9.7 02/25/24

## 2024-03-09 NOTE — Assessment & Plan Note (Signed)
 Pain is managed with Tylenol , Gabapentin  Ambulates with walker slowly.

## 2024-03-09 NOTE — Assessment & Plan Note (Signed)
 Hgb 9.7 02/25/24 No apparent bleeding Will update CBC/diff, Fe, Ferritin, Vit B12(already taking Vit B12)

## 2024-03-09 NOTE — Assessment & Plan Note (Signed)
 09/30/21 CXR mild congestive heart failure, CXR 10/25/21 showed CHF. Takes Furosemide  20mg  x2/wk, compensated clinically. Echo EF 60%, DOE/cough on and off. BNP 24 02/17/23

## 2024-03-09 NOTE — Assessment & Plan Note (Signed)
 No behavorial issues, SNF [facility] for supportive care

## 2024-03-09 NOTE — Progress Notes (Unsigned)
 " Location:  [facility] Room Number: N031-A Place of Service:  SNF (31) Provider:  Jaelle Campanile X, NP  Patient Care Team: Luddie Boghosian X, NP as PCP - General (Internal Medicine) Carra Brindley X, NP as Nurse Practitioner (Internal Medicine) Charlanne Fredia CROME, MD as Consulting Physician (Internal Medicine) Charlanne Fredia CROME, MD (Internal Medicine)  Extended Emergency Contact Information Primary Emergency Contact: Lees,[name]  [place] Home Phone: [phone] Relation: Son Secondary Emergency Contact: [facility] Mobile Phone: [phone] Relation: Relative  Code Status:  DNR Goals of care: Advanced Directive information    02/24/2024    8:44 PM  Advanced Directives  Type of Advance Directive Out of facility DNR (pink MOST or yellow form)  Does patient want to make changes to medical advance directive? No - Patient declined     Chief Complaint  Patient presents with   Medical Management of Chronic Issues    Routine Visit    HPI:  Pt is a 89 y.o. female seen today for medical management of chronic diseases.    Hospitalized 02/24/24-02/26/24 for acute encephalopathy, unremarkable diagnostic workups   Anemia, Hgb 9.7 02/25/24  Hemorrhoids, bright red blood noted after a BM, no further anal/rectal bleed, denied abd pain/discomfort, nausea, vomiting.              GERD, stable, on Omeprazole , Hgb 13 10/05/23>>9.7 02/25/24 CHF, 09/30/21 CXR mild congestive heart failure, CXR 10/25/21 showed CHF. Takes Furosemide  20mg  x2/wk, compensated clinically. Echo  EF 60%, DOE/cough on and off. BNP 24 02/17/23             Peripheral neuropathy, taking Gabapentin , Vit B12 Gait abnormality, uses walker to ambulate slowly.       Hx of TIA. Allergic to statin. On ASA.  HPOA desired no hospital evaluation in the past. ED 07/27/23 CT head/MR brain no acute intracranial changes.  Seizure like activity, hx of, likely post fall, off Tramadol , 07/27/23 ED neurology:  increase Gabapentin  400mg  every day, f/u neurology out patient-HPOA desires to delay it.  OA: T5, T8, L3 compression fxs. Takes Tylenol  and Gabapentin  for pain, off Tramadol , DDD per CT head/cervical spine 08/02/20, Vit D 61 05/19/23, on Vit D, Ca supplements.              Hx of CVA per CT head 08/02/20 Remote infarcts within the a occipital cortices bilaterally again noted. Interval development of remote infarcts within the left parietal lobe and periventricular white matte. On ASA.  07/27/23. CT head/MR brain: No acute intracranial abnormality             Dementia, 07/15/19 CT head Mild generalized parenchymal atrophy and chronic small vessel ischemic disease, 07/27/23. CT head/MR brain: No acute intracranial abnormality. Mild chronic microvascular ischemic disease with a few scattered remote infarcts involving the left parietal and bilateral occipital Lobes, tolerated Memantine  well. 06/22/22 MMSE 17/30             HTN, loose blood pressure control, on Benazepril , ASA             CKD Bun/creat 25/1.26 02/25/24             Gout, stable, on Allopurinol                    Hypothyroidism, on Levothyroxine , TSH 2.57 02/25/24             Abnormal CXR, suggested CT chest, not candidate for workup  Past Medical History:  Diagnosis Date   Cognitive changes    Dementia (HCC)    Depression    Gout    Hypertension    Hypothyroidism    Peripheral neuropathy    History reviewed. No pertinent surgical history.  Allergies[1]  Outpatient Encounter Medications as of 03/09/2024  Medication Sig   acetaminophen  (TYLENOL ) 325 MG tablet Take 650 mg by mouth with breakfast, with lunch, and with evening meal.   allopurinol  (ZYLOPRIM ) 100 MG tablet Take 100 mg by mouth daily.   aspirin  81 MG chewable tablet Chew 81 mg by mouth daily.   benazepril  (LOTENSIN ) 5 MG tablet Take 5 mg by mouth daily.   Calcium  Carb-Cholecalciferol  500-400 MG-UNIT TABS Take 2 tablets by mouth daily.     Cholecalciferol  (VITAMIN D ) 50 MCG (2000 UT) CAPS Take 2,000 Units by mouth daily.   cyanocobalamin  1000 MCG tablet Take 1 tablet (1,000 mcg total) by mouth daily.   Docusate Sodium  100 MG capsule Take 100 mg by mouth 2 (two) times daily.   furosemide  (LASIX ) 20 MG tablet Take 20 mg by mouth See admin instructions. Take 20 mg by mouth at 9 AM on Mondays and Thursdays   gabapentin  (NEURONTIN ) 400 MG capsule Take 1 capsule (400 mg total) by mouth at bedtime.   hydrocortisone (ANUSOL-HC) 2.5 % rectal cream Place 1 Application rectally 2 (two) times daily as needed for hemorrhoids (APPLY TOPICALLY).   levothyroxine  (SYNTHROID ) 100 MCG tablet Take 100 mcg by mouth See admin instructions. Take 100 mcg by mouth at 6 AM on Wednesdays ONLY   levothyroxine  (SYNTHROID ) 50 MCG tablet Take 50 mcg by mouth See admin instructions. Take 50 mcg by mouth at 6 AM on Sun/Mon/Tues/Thurs/Fri/Sat   memantine  (NAMENDA ) 10 MG tablet Take 5 mg by mouth in the morning and at bedtime.   omeprazole  (PRILOSEC  OTC) 20 MG tablet Take 20 mg by mouth daily before breakfast.   potassium chloride  SA (KLOR-CON  M) 20 MEQ tablet Take 20 mEq by mouth See admin instructions. Take 20 mEq by mouth at 9 AM on Mondays and Thursdays   No facility-administered encounter medications on file as of 03/09/2024.    Review of Systems  Constitutional:  Negative for activity change, diaphoresis and fever.  HENT:  Positive for hearing loss. Negative for congestion.   Eyes:  Negative for visual disturbance.  Respiratory:  Negative for cough, shortness of breath and wheezing.        DOE   Cardiovascular:  Negative for leg swelling.  Gastrointestinal:  Negative for abdominal pain and constipation.  Genitourinary:  Negative for dysuria and urgency.  Musculoskeletal:  Positive for arthralgias, back pain and gait problem.       Chronic, aches.  Baseline walker with walker slowly with SBA  Skin:  Negative for color change.  Neurological:   Negative for facial asymmetry, speech difficulty and weakness.  Psychiatric/Behavioral:  Positive for confusion. Negative for sleep disturbance. The patient is not nervous/anxious.     Immunization History  Administered Date(s) Administered   Fluad Quad(high Dose 65+) 01/01/2022   INFLUENZA, HIGH DOSE SEASONAL PF 12/11/2018, 01/01/2022, 12/10/2022, 12/15/2023   Influenza-Unspecified 03/24/2018, 12/21/2019, 12/26/2020   Moderna Covid-19 Vaccine  Bivalent Booster 44yrs & up 01/09/2022, 12/24/2022   Moderna SARS-COV2 Booster Vaccination 02/20/2021, 07/26/2021   Moderna Sars-Covid-2 Vaccination 03/12/2019, 04/09/2019, 04/21/2019, 01/17/2020, 08/07/2020   Pneumococcal Conjugate-13 09/09/2012, 10/28/2013   Pneumococcal Polysaccharide-23 04/02/2009, 04/16/2009   Tdap 07/15/2019   Unspecified SARS-COV-2 Vaccination 01/04/2024   Zoster Recombinant(Shingrix) 04/16/2009,  06/20/2022   Zoster, Live 04/16/2009   Zoster, Unspecified 04/16/2009   Pertinent  Health Maintenance Due  Topic Date Due   Influenza Vaccine  Completed   Bone Density Scan  Discontinued      01/07/2022    9:04 AM 02/06/2022   10:48 AM 04/21/2022    3:50 PM 06/12/2022   10:12 AM 06/12/2022   12:58 PM  Fall Risk  Falls in the past year? 0 0 0 0 1  Was there an injury with Fall? 0  0  0  0  0   Fall Risk Category Calculator 0 0 0 0 2  Fall Risk Category (Retired) Low  Low      (RETIRED) Patient Fall Risk Level Moderate fall risk  Moderate fall risk      Patient at Risk for Falls Due to History of fall(s) History of fall(s) No Fall Risks No Fall Risks History of fall(s);Impaired balance/gait;Impaired mobility  Fall risk Follow up Falls evaluation completed  Falls evaluation completed  Falls evaluation completed Falls evaluation completed Falls evaluation completed;Education provided;Falls prevention discussed;Follow up appointment     Data saved with a previous flowsheet row definition   Functional Status  Survey:    Vitals:   03/09/24 0912  BP: (!) 126/56  Pulse: 83  Resp: 20  Temp: 98 F (36.7 C)  SpO2: 96%  Weight: 165 lb 4.8 oz (75 kg)  Height: 5' 4 (1.626 m)   Body mass index is 28.37 kg/m. Physical Exam Vitals and nursing note reviewed.  Constitutional:      Comments: Appears tired  HENT:     Head: Normocephalic and atraumatic.     Nose: Nose normal.     Mouth/Throat:     Mouth: Mucous membranes are moist.  Eyes:     Extraocular Movements: Extraocular movements intact.     Conjunctiva/sclera: Conjunctivae normal.     Pupils: Pupils are equal, round, and reactive to light.  Cardiovascular:     Rate and Rhythm: Normal rate and regular rhythm.     Heart sounds: No murmur heard.    Comments: Weak dorsalis pedis pulses R+L from previous examination.  Pulmonary:     Effort: Pulmonary effort is normal.     Breath sounds: Rales present.     Comments: Bibasilar rales.  Abdominal:     General: Bowel sounds are normal.     Palpations: Abdomen is soft.     Tenderness: There is no abdominal tenderness.  Genitourinary:    Comments: Multiple external hemorrhoids from previous examination Musculoskeletal:        General: No tenderness.     Cervical back: Normal range of motion and neck supple.     Right lower leg: No edema.     Left lower leg: No edema.  Skin:    General: Skin is warm and dry.  Neurological:     General: No focal deficit present.     Mental Status: She is alert. Mental status is at baseline.     Gait: Gait abnormal.     Comments: Ambulates with walker is her baseline Hx of resolved Right facial, RUE, RLE weakness  Psychiatric:        Mood and Affect: Mood normal.        Behavior: Behavior normal.     Labs reviewed: Recent Labs    07/27/23 1401 10/05/23 0000 02/24/24 1718 02/25/24 1220  NA 141 139 142 142  K 3.6 4.6 4.2 4.9  CL 104 1*  106 107  CO2 27 27* 26 27  GLUCOSE 96  --  112* 100*  BUN 18 33* 27* 25*  CREATININE 1.48* 1.4* 1.28*  1.26*  CALCIUM  9.1 9.7 9.5 9.5  MG  --   --   --  2.2  PHOS  --   --   --  3.9   Recent Labs    07/27/23 1401 10/05/23 0000 02/24/24 1718  AST 17 14 18   ALT 11 9 8   ALKPHOS 69 75 123  BILITOT 0.5  --  0.3  PROT 6.4*  --  6.9  ALBUMIN 3.2* 4.3 3.8   Recent Labs    07/27/23 1401 10/05/23 0000 02/24/24 1718 02/25/24 1220  WBC 9.8 7.8 8.8 10.7*  NEUTROABS 7.8* 5,873.00 6.6  --   HGB 12.3 13.0 9.9* 9.7*  HCT 39.2 39 32.3* 30.5*  MCV 102.1*  --  100.9* 97.8  PLT 202 216 196 220   Lab Results  Component Value Date   TSH 2.570 02/25/2024   No results found for: HGBA1C No results found for: CHOL, HDL, LDLCALC, LDLDIRECT, TRIG, CHOLHDL  Significant Diagnostic Results in last 30 days:  CT HEAD WO CONTRAST ( ) Result Date: 02/24/2024 EXAM: CT HEAD WITHOUT CONTRAST 02/24/2024 05:46:17 PM TECHNIQUE: CT of the head was performed without the administration of intravenous contrast. Automated exposure control, iterative reconstruction, and/or weight based adjustment of the mA/kV was utilized to reduce the radiation dose to as low as reasonably achievable. COMPARISON: 07/27/2023 CLINICAL HISTORY: Mental status change, unknown cause. FINDINGS: BRAIN AND VENTRICLES: No acute hemorrhage. No evidence of acute infarct. No hydrocephalus. No extra-axial collection. No mass effect or midline shift. Stable moderate generalized cerebral volume loss. Stable moderate diffuse periventricular and deep cerebral white matter disease. Stable chronic encephalomalacia in left parietal and bilateral occipital lobes. Moderate calcific atheromatous disease within the carotid siphons. ORBITS: Status post bilateral cataract surgery. No acute abnormality. SINUSES: Moderate mucosal disease within ethmoid air cells. SOFT TISSUES AND SKULL: No acute soft tissue abnormality. No skull fracture. LIMITATIONS/ARTIFACTS: Motion artifact limits examination. IMPRESSION: 1. No acute intracranial abnormality, within  the limits of motion artifact. 2. Stable moderate generalized cerebral volume loss, diffuse periventricular and deep cerebral white matter disease. 3. Chronic encephalomalacia in left parietal and bilateral occipital lobes. Electronically signed by: Lonni Necessary MD 02/24/2024 05:53 PM EST RP Workstation: [id number]   DG Chest Port 1 View Result Date: 02/24/2024 CLINICAL DATA:  SOB cough EXAM: PORTABLE CHEST - 1 VIEW COMPARISON:  11/26/2020 FINDINGS: Lower lung volumes with elevation of the right hemidiaphragm. No focal airspace consolidation, pleural effusion, or pneumothorax. Mild cardiomegaly. Tortuous aorta with aortic atherosclerosis. No acute fracture or destructive lesions. Multilevel thoracic osteophytosis. IMPRESSION: Low lung volumes and mild cardiomegaly. Otherwise, no acute cardiopulmonary abnormality. Electronically Signed   By: Rogelia Myers M.D.   On: 02/24/2024 17:33    Assessment/Plan Anemia due to multiple mechanisms Hgb 9.7 02/25/24 No apparent bleeding Will update CBC/diff, Fe, Ferritin, Vit B12(already taking Vit B12)  GERD (gastroesophageal reflux disease) stable, on Omeprazole , Hgb 13 10/05/23>>9.7 02/25/24  Congestive heart failure (CHF) (HCC) 09/30/21 CXR mild congestive heart failure, CXR 10/25/21 showed CHF. Takes Furosemide  20mg  x2/wk, compensated clinically. Echo  EF 60%, DOE/cough on and off. BNP 24 02/17/23  Peripheral neuropathy Peripheral neuropathy, taking Gabapentin , Vit B12  Gout Stable Continue Allopurinol .   Gait instability Gait abnormality, uses walker to ambulate slowly.        History of TIAs Hx of TIA. Allergic to statin.  On ASA.  HPOA desired no hospital evaluation in the past. ED 07/27/23 CT head/MR brain no acute intracranial changes.   Generalized osteoarthritis of multiple sites Pain is managed with Tylenol , Gabapentin  Ambulates with walker slowly.   History of CVA (cerebrovascular accident) No apparent focal residual.   Major  neurocognitive disorder (HCC) No behavorial issues, SNF [facility] for supportive care  HTN (hypertension)  HTN, loose blood pressure control, on Benazepril , ASA             CKD Bun/creat 25/1.26 02/25/24     Family/ staff Communication: plan of care reviewed with the patient and charge nurse.   Labs/tests ordered:  CBC/diff, Fe, Ferritin, Vit B12           [1] Allergies Allergen Reactions   Actonel [Risedronate] Other (See Comments)    Allergic, per [facility]   Hydrochlorothiazide Other (See Comments)    Allergic, per [facility]   Lipitor [Atorvastatin] Other (See Comments)    Allergic, per [facility]   Neomycin Other (See Comments)    Allergic, per [facility]   Polysporin [Bacitracin-Polymyxin B] Other (See Comments)    Allergic, per [facility]   Prolia  [Denosumab ] Other (See Comments)    Allergic, per [facility]   Sulfa Antibiotics Other (See Comments)    Allergic, per [facility]   Zocor [Simvastatin] Other (See Comments)    Allergic, per [facility]  "

## 2024-03-09 NOTE — Assessment & Plan Note (Signed)
 Gait abnormality, uses walker to ambulate slowly.

## 2024-03-09 NOTE — Assessment & Plan Note (Signed)
"   HTN, loose blood pressure control, on Benazepril , ASA             CKD Bun/creat 25/1.26 02/25/24 "

## 2024-03-09 NOTE — Assessment & Plan Note (Signed)
 No apparent focal residual.

## 2024-03-15 LAB — CBC: RBC: 3.23 — AB (ref 3.87–5.11)

## 2024-03-15 LAB — CBC AND DIFFERENTIAL
HCT: 31 — AB (ref 36–46)
Hemoglobin: 9.5 — AB (ref 12.0–16.0)
Neutrophils Absolute: 6175
Platelets: 240 K/uL (ref 150–400)
WBC: 8.6

## 2024-03-15 LAB — VITAMIN B12: Vitamin B-12: 737

## 2024-03-15 LAB — IRON,TIBC AND FERRITIN PANEL
%SAT: 18
Ferritin: 12
Iron: 54
TIBC: 307

## 2024-03-16 ENCOUNTER — Encounter: Payer: Self-pay | Admitting: Adult Health

## 2024-03-16 ENCOUNTER — Non-Acute Institutional Stay (SKILLED_NURSING_FACILITY): Payer: Self-pay | Admitting: Adult Health

## 2024-03-16 DIAGNOSIS — F0390 Unspecified dementia without behavioral disturbance: Secondary | ICD-10-CM

## 2024-03-16 DIAGNOSIS — D509 Iron deficiency anemia, unspecified: Secondary | ICD-10-CM | POA: Diagnosis not present

## 2024-03-16 DIAGNOSIS — I129 Hypertensive chronic kidney disease with stage 1 through stage 4 chronic kidney disease, or unspecified chronic kidney disease: Secondary | ICD-10-CM

## 2024-03-16 NOTE — Progress Notes (Signed)
 " Location:  [facility] Room Number: N031-A Place of Service:  SNF (31) Provider:  Jereld JAYSON Berneda Estelle, NP   Patient Care Team: Mast, Man X, NP as PCP - General (Internal Medicine) Mast, Man X, NP as Nurse Practitioner (Internal Medicine) Charlanne Fredia CROME, MD as Consulting Physician (Internal Medicine) Charlanne Fredia CROME, MD (Internal Medicine)  Extended Emergency Contact Information Primary Emergency Contact: Kean,[name]  [place] Home Phone: [phone] Relation: Son Secondary Emergency Contact: Brien [facility] Mobile Phone: [phone] Relation: Relative  Code Status:  DNR Goals of care: Advanced Directive information    02/24/2024    8:44 PM  Advanced Directives  Type of Advance Directive Out of facility DNR (pink MOST or yellow form)  Does patient want to make changes to medical advance directive? No - Patient declined     Chief Complaint  Patient presents with   Anemia    HPI:  Pt is a 89 y.o. female seen today for an acute visit for anemia. She is a resident of [facility]. Latest lab showed: hemoglobin 9.5, total iron level 54, ferritin 12L and iron binding capacity 307.   She has hypertension for which she takes Benazepril  5 mg daily. BP 105/61.  She has dementia and takes Memantine  5 mg BID. Latest BIMS score 12/15.   Past Medical History:  Diagnosis Date   Cognitive changes    Dementia (HCC)    Depression    Gout    Hypertension    Hypothyroidism    Peripheral neuropathy    History reviewed. No pertinent surgical history.  Allergies[1]  Outpatient Encounter Medications as of 03/16/2024  Medication Sig   acetaminophen  (TYLENOL ) 325 MG tablet Take 650 mg by mouth with breakfast, with lunch, and with evening meal.   allopurinol  (ZYLOPRIM ) 100 MG tablet Take 100 mg by mouth daily.   aspirin  81 MG chewable tablet Chew 81 mg by mouth daily.   benazepril  (LOTENSIN ) 5 MG tablet Take 5 mg by  mouth daily.   Calcium  Carb-Cholecalciferol  500-400 MG-UNIT TABS Take 2 tablets by mouth daily.    Cholecalciferol  (VITAMIN D ) 50 MCG (2000 UT) CAPS Take 2,000 Units by mouth daily.   cyanocobalamin  1000 MCG tablet Take 1 tablet (1,000 mcg total) by mouth daily.   Docusate Sodium  100 MG capsule Take 100 mg by mouth 2 (two) times daily.   ferrous sulfate 325 (65 FE) MG tablet Take 325 mg by mouth daily. Give 1 tablet by mouth one time a day for iron deficiency anemia   furosemide  (LASIX ) 20 MG tablet Take 20 mg by mouth See admin instructions. Take 20 mg by mouth at 9 AM on Mondays and Thursdays   gabapentin  (NEURONTIN ) 400 MG capsule Take 1 capsule (400 mg total) by mouth at bedtime.   hydrocortisone (ANUSOL-HC) 2.5 % rectal cream Place 1 Application rectally 2 (two) times daily as needed for hemorrhoids (APPLY TOPICALLY).   levothyroxine  (SYNTHROID ) 100 MCG tablet Take 100 mcg by mouth See admin instructions. Take 100 mcg by mouth at 6 AM on Wednesdays ONLY   levothyroxine  (SYNTHROID ) 50 MCG tablet Take 50 mcg by mouth See admin instructions. Take 50 mcg by mouth at 6 AM on Sun/Mon/Tues/Thurs/Fri/Sat   memantine  (NAMENDA ) 10 MG tablet Take 5 mg by mouth in the morning and at bedtime.   omeprazole  (PRILOSEC  OTC) 20 MG tablet Take 20 mg by mouth daily before breakfast.   potassium chloride  SA (KLOR-CON  M) 20 MEQ tablet Take  20 mEq by mouth See admin instructions. Take 20 mEq by mouth at 9 AM on Mondays and Thursdays   No facility-administered encounter medications on file as of 03/16/2024.    Review of Systems  Constitutional:  Negative for appetite change, chills, fatigue and fever.  HENT:  Negative for congestion, hearing loss, rhinorrhea and sore throat.   Eyes: Negative.   Respiratory:  Negative for cough, shortness of breath and wheezing.   Cardiovascular:  Negative for chest pain, palpitations and leg swelling.  Gastrointestinal:  Negative for abdominal pain, constipation, diarrhea,  nausea and vomiting.  Genitourinary:  Negative for dysuria.  Musculoskeletal:  Negative for arthralgias, back pain and myalgias.  Skin:  Negative for color change, rash and wound.  Neurological:  Negative for dizziness, weakness and headaches.  Psychiatric/Behavioral:  Negative for behavioral problems. The patient is not nervous/anxious.     Immunization History  Administered Date(s) Administered   Fluad Quad(high Dose 65+) 01/01/2022   INFLUENZA, HIGH DOSE SEASONAL PF 12/11/2018, 01/01/2022, 12/10/2022, 12/15/2023   Influenza-Unspecified 03/24/2018, 12/21/2019, 12/26/2020   Moderna Covid-19 Vaccine  Bivalent Booster 61yrs & up 01/09/2022, 12/24/2022   Moderna SARS-COV2 Booster Vaccination 02/20/2021, 07/26/2021   Moderna Sars-Covid-2 Vaccination 03/12/2019, 04/09/2019, 04/21/2019, 01/17/2020, 08/07/2020   Pneumococcal Conjugate-13 09/09/2012, 10/28/2013   Pneumococcal Polysaccharide-23 04/02/2009, 04/16/2009   RSV,unspecified 12/04/2023   Tdap 07/15/2019   Unspecified SARS-COV-2 Vaccination 01/04/2024   Zoster Recombinant(Shingrix) 04/16/2009, 06/20/2022   Zoster, Live 04/16/2009   Zoster, Unspecified 04/16/2009, 04/08/2023   Pertinent  Health Maintenance Due  Topic Date Due   Influenza Vaccine  Completed   Bone Density Scan  Discontinued      01/07/2022    9:04 AM 02/06/2022   10:48 AM 04/21/2022    3:50 PM 06/12/2022   10:12 AM 06/12/2022   12:58 PM  Fall Risk  Falls in the past year? 0 0 0 0 1  Was there an injury with Fall? 0  0  0  0  0   Fall Risk Category Calculator 0 0 0 0 2  Fall Risk Category (Retired) Low  Low      (RETIRED) Patient Fall Risk Level Moderate fall risk  Moderate fall risk      Patient at Risk for Falls Due to History of fall(s) History of fall(s) No Fall Risks No Fall Risks History of fall(s);Impaired balance/gait;Impaired mobility  Fall risk Follow up Falls evaluation completed  Falls evaluation completed  Falls evaluation completed Falls evaluation  completed Falls evaluation completed;Education provided;Falls prevention discussed;Follow up appointment     Data saved with a previous flowsheet row definition   Functional Status Survey:    Vitals:   03/16/24 1528  BP: 105/61  Pulse: 80  Resp: 18  Temp: (!) 97 F (36.1 C)  SpO2: 93%  Weight: 163 lb (73.9 kg)  Height: 5' 4 (1.626 m)   Body mass index is 27.98 kg/m. Physical Exam Constitutional:      General: She is not in acute distress.    Appearance: Normal appearance.  HENT:     Head: Normocephalic and atraumatic.     Nose: Nose normal.     Mouth/Throat:     Mouth: Mucous membranes are moist.  Eyes:     Conjunctiva/sclera: Conjunctivae normal.  Cardiovascular:     Rate and Rhythm: Normal rate and regular rhythm.  Pulmonary:     Effort: Pulmonary effort is normal.     Breath sounds: Normal breath sounds.  Abdominal:     General: Bowel  sounds are normal.     Palpations: Abdomen is soft.  Musculoskeletal:     Cervical back: Normal range of motion.  Skin:    General: Skin is warm and dry.  Psychiatric:        Mood and Affect: Mood normal.        Behavior: Behavior normal.     Labs reviewed: Recent Labs    07/27/23 1401 10/05/23 0000 02/24/24 1718 02/25/24 1220  NA 141 139 142 142  K 3.6 4.6 4.2 4.9  CL 104 1* 106 107  CO2 27 27* 26 27  GLUCOSE 96  --  112* 100*  BUN 18 33* 27* 25*  CREATININE 1.48* 1.4* 1.28* 1.26*  CALCIUM  9.1 9.7 9.5 9.5  MG  --   --   --  2.2  PHOS  --   --   --  3.9   Recent Labs    07/27/23 1401 10/05/23 0000 02/24/24 1718  AST 17 14 18   ALT 11 9 8   ALKPHOS 69 75 123  BILITOT 0.5  --  0.3  PROT 6.4*  --  6.9  ALBUMIN 3.2* 4.3 3.8   Recent Labs    07/27/23 1401 10/05/23 0000 02/24/24 1718 02/25/24 1220 03/15/24 0000  WBC 9.8 7.8 8.8 10.7* 8.6  NEUTROABS 7.8* 5,873.00 6.6  --  6,175.00  HGB 12.3 13.0 9.9* 9.7* 9.5*  HCT 39.2 39 32.3* 30.5* 31*  MCV 102.1*  --  100.9* 97.8  --   PLT 202 216 196 220 240    Lab Results  Component Value Date   TSH 2.570 02/25/2024   No results found for: HGBA1C No results found for: CHOL, HDL, LDLCALC, LDLDIRECT, TRIG, CHOLHDL  Significant Diagnostic Results in last 30 days:  CT HEAD WO CONTRAST ( ) Result Date: 02/24/2024 EXAM: CT HEAD WITHOUT CONTRAST 02/24/2024 05:46:17 PM TECHNIQUE: CT of the head was performed without the administration of intravenous contrast. Automated exposure control, iterative reconstruction, and/or weight based adjustment of the mA/kV was utilized to reduce the radiation dose to as low as reasonably achievable. COMPARISON: 07/27/2023 CLINICAL HISTORY: Mental status change, unknown cause. FINDINGS: BRAIN AND VENTRICLES: No acute hemorrhage. No evidence of acute infarct. No hydrocephalus. No extra-axial collection. No mass effect or midline shift. Stable moderate generalized cerebral volume loss. Stable moderate diffuse periventricular and deep cerebral white matter disease. Stable chronic encephalomalacia in left parietal and bilateral occipital lobes. Moderate calcific atheromatous disease within the carotid siphons. ORBITS: Status post bilateral cataract surgery. No acute abnormality. SINUSES: Moderate mucosal disease within ethmoid air cells. SOFT TISSUES AND SKULL: No acute soft tissue abnormality. No skull fracture. LIMITATIONS/ARTIFACTS: Motion artifact limits examination. IMPRESSION: 1. No acute intracranial abnormality, within the limits of motion artifact. 2. Stable moderate generalized cerebral volume loss, diffuse periventricular and deep cerebral white matter disease. 3. Chronic encephalomalacia in left parietal and bilateral occipital lobes. Electronically signed by: Lonni Necessary MD 02/24/2024 05:53 PM EST RP Workstation: [id number]   DG Chest Port 1 View Result Date: 02/24/2024 CLINICAL DATA:  SOB cough EXAM: PORTABLE CHEST - 1 VIEW COMPARISON:  11/26/2020 FINDINGS: Lower lung volumes with elevation of the  right hemidiaphragm. No focal airspace consolidation, pleural effusion, or pneumothorax. Mild cardiomegaly. Tortuous aorta with aortic atherosclerosis. No acute fracture or destructive lesions. Multilevel thoracic osteophytosis. IMPRESSION: Low lung volumes and mild cardiomegaly. Otherwise, no acute cardiopulmonary abnormality. Electronically Signed   By: Rogelia Myers M.D.   On: 02/24/2024 17:33    Assessment/Plan  1. Iron deficiency  anemia, unspecified iron deficiency anemia type (Primary) Lab Results  Component Value Date   HGB 9.5 (A) 03/15/2024    -  will start on FeSO4 325 mg daily -  CBC in a month  2. Benign hypertension with chronic kidney disease -  BP stable -  continue Benazepril  5 mg daily  3. Dementia without behavioral disturbance, psychotic disturbance, mood disturbance, or anxiety, unspecified dementia severity, unspecified dementia type (HCC) -  BIMS score 12/15,  ranging as moderate cognitive impairment -  continue Memantine  5 mg BID -  continue supportive care -  fall precautions   Family/ staff Communication:  Discussed plan of care with resident and charge nurse.  Labs/tests ordered:  CBC in a month     [1]  Allergies Allergen Reactions   Actonel [Risedronate] Other (See Comments)    Allergic, per [facility]   Hydrochlorothiazide Other (See Comments)    Allergic, per [facility]   Lipitor [Atorvastatin] Other (See Comments)    Allergic, per [facility]   Neomycin Other (See Comments)    Allergic, per [facility]   Polysporin [Bacitracin-Polymyxin B] Other (See Comments)    Allergic, per [facility]   Prolia  [Denosumab ] Other (See Comments)    Allergic, per [facility]   Sulfa Antibiotics Other (See Comments)    Allergic, per [facility]   Zocor [Simvastatin] Other (See Comments)    Allergic, per [facility]   "

## 2024-04-14 ENCOUNTER — Encounter: Payer: Self-pay | Admitting: Nurse Practitioner

## 2024-04-14 ENCOUNTER — Non-Acute Institutional Stay: Payer: Self-pay | Admitting: Nurse Practitioner

## 2024-04-14 DIAGNOSIS — K219 Gastro-esophageal reflux disease without esophagitis: Secondary | ICD-10-CM

## 2024-04-14 DIAGNOSIS — D6489 Other specified anemias: Secondary | ICD-10-CM

## 2024-04-14 DIAGNOSIS — R569 Unspecified convulsions: Secondary | ICD-10-CM

## 2024-04-14 DIAGNOSIS — M109 Gout, unspecified: Secondary | ICD-10-CM

## 2024-04-14 DIAGNOSIS — Z8673 Personal history of transient ischemic attack (TIA), and cerebral infarction without residual deficits: Secondary | ICD-10-CM

## 2024-04-14 DIAGNOSIS — F0390 Unspecified dementia without behavioral disturbance: Secondary | ICD-10-CM

## 2024-04-14 DIAGNOSIS — I10 Essential (primary) hypertension: Secondary | ICD-10-CM

## 2024-04-14 DIAGNOSIS — I509 Heart failure, unspecified: Secondary | ICD-10-CM

## 2024-04-14 DIAGNOSIS — E039 Hypothyroidism, unspecified: Secondary | ICD-10-CM

## 2024-04-14 DIAGNOSIS — N1831 Chronic kidney disease, stage 3a: Secondary | ICD-10-CM

## 2024-04-14 DIAGNOSIS — M159 Polyosteoarthritis, unspecified: Secondary | ICD-10-CM

## 2024-04-14 LAB — CBC AND DIFFERENTIAL
HCT: 32 — AB (ref 36–46)
Hemoglobin: 10.3 — AB (ref 12.0–16.0)
Neutrophils Absolute: 4629
Platelets: 210 10*3/uL (ref 150–400)
WBC: 7.1

## 2024-04-14 LAB — CBC: RBC: 3.29 — AB (ref 3.87–5.11)

## 2024-04-14 NOTE — Assessment & Plan Note (Signed)
 on Levothyroxine , TSH 2.57 02/25/24

## 2024-04-14 NOTE — Assessment & Plan Note (Signed)
 stable, on Omeprazole.

## 2024-04-14 NOTE — Assessment & Plan Note (Signed)
 Euvolemic CHF, 09/30/21 CXR mild congestive heart failure, CXR 10/25/21 showed CHF. Takes Furosemide  20mg  x2/wk, compensated clinically. Echo  EF 60%, DOE/cough on and off. BNP 24 02/17/23

## 2024-04-14 NOTE — Assessment & Plan Note (Signed)
 Seizure like activity, hx of, likely post fall, off Tramadol , 07/27/23

## 2024-04-14 NOTE — Assessment & Plan Note (Signed)
 Mild chronic microvascular ischemic disease with a few scattered remote infarcts involving the left parietal and bilateral occipital Lobes, tolerated Memantine  well. 06/22/22 MMSE 17/30

## 2024-04-14 NOTE — Assessment & Plan Note (Signed)
 Hx of CVA per CT head 08/02/20 Remote infarcts within the a occipital cortices bilaterally again noted. Interval development of remote infarcts within the left parietal lobe and periventricular white matte. On ASA.  07/27/23. CT head/MR brain: No acute intracranial abnormality

## 2024-04-14 NOTE — Assessment & Plan Note (Signed)
stable, on Allopurinol       

## 2024-04-14 NOTE — Assessment & Plan Note (Signed)
 Hgb 10.3 04/14/24 at her baseline.

## 2024-04-14 NOTE — Assessment & Plan Note (Signed)
 Ambulates with walker slowly Taking Tylenol , Gabapentin  for pain.

## 2024-04-14 NOTE — Assessment & Plan Note (Signed)
 Bun/creat 25/1.26 02/25/24

## 2024-04-14 NOTE — Assessment & Plan Note (Signed)
"   loose blood pressure control, on Benazepril , ASA "

## 2024-04-14 NOTE — Progress Notes (Unsigned)
 " Location:  [facility] Room Number: N031-A Place of Service:  SNF (31) Provider:  Davonda Ausley X,NP  Marguerite Barba X, NP  Patient Care Team: Mishal Probert X, NP as PCP - General (Internal Medicine) Tristina Sahagian X, NP as Nurse Practitioner (Internal Medicine) Charlanne Fredia CROME, MD as Consulting Physician (Internal Medicine) Charlanne Fredia CROME, MD (Internal Medicine)  Extended Emergency Contact Information Primary Emergency Contact: Short,[name]  [place] Home Phone: [phone] Relation: Son Secondary Emergency Contact: B[facility] Mobile Phone: [phone] Relation: Relative  Code Status:  DNR Goals of care: Advanced Directive information    04/14/2024    2:31 PM  Advanced Directives  Does Patient Have a Medical Advance Directive? Yes  Type of Estate Agent of [place];Out of facility DNR (pink MOST or yellow form)  Does patient want to make changes to medical advance directive? No - Patient declined  Copy of Healthcare Power of Attorney in Chart? Yes - validated most recent copy scanned in chart (See row information)  Pre-existing out of facility DNR order (yellow form or pink MOST form) Pink MOST/Yellow Form most recent copy in chart - Physician notified to receive inpatient order     Chief Complaint  Patient presents with   Medical Management of Chronic Issues    Routine Visit.    HPI:  Pt is a 89 y.o. female seen today for medical management of chronic diseases.    Hospitalized 02/24/24-02/26/24 for acute encephalopathy, unremarkable diagnostic workups                 Anemia, Hgb 10.3 04/14/24             Hemorrhoids, bright red blood noted after a BM, no further anal/rectal bleed, denied abd pain/discomfort, nausea, vomiting.              GERD, stable, on Omeprazole  CHF, 09/30/21 CXR mild congestive heart failure, CXR 10/25/21 showed CHF. Takes Furosemide  20mg  x2/wk, compensated clinically. Echo  EF 60%, DOE/cough on and  off. BNP 24 02/17/23             Peripheral neuropathy, taking Gabapentin , Vit B12 Gait abnormality, uses walker to ambulate slowly.       Hx of TIA. Allergic to statin. On ASA.  HPOA desired no hospital evaluation in the past. ED 07/27/23 CT head/MR brain no acute intracranial changes.  Seizure like activity, hx of, likely post fall, off Tramadol , 07/27/23 ED neurology: increase Gabapentin  400mg  every day, f/u neurology out patient-HPOA desires to delay it.  OA: T5, T8, L3 compression fxs. Takes Tylenol  and Gabapentin  for pain, off Tramadol , DDD per CT head/cervical spine 08/02/20, Vit D 61 05/19/23, on Vit D, Ca supplements.              Hx of CVA per CT head 08/02/20 Remote infarcts within the a occipital cortices bilaterally again noted. Interval development of remote infarcts within the left parietal lobe and periventricular white matte. On ASA.  07/27/23. CT head/MR brain: No acute intracranial abnormality             Dementia, 07/15/19 CT head Mild generalized parenchymal atrophy and chronic small vessel ischemic disease, 07/27/23. CT head/MR brain: No acute intracranial abnormality. Mild chronic microvascular ischemic disease with a few scattered remote infarcts involving the left parietal and bilateral occipital Lobes, tolerated Memantine  well. 06/22/22 MMSE 17/30             HTN, loose blood pressure control, on Benazepril ,  ASA             CKD Bun/creat 25/1.26 02/25/24             Gout, stable, on Allopurinol                    Hypothyroidism, on Levothyroxine , TSH 2.57 02/25/24             Abnormal CXR, suggested CT chest, not candidate for workup       Past Medical History:  Diagnosis Date   Cognitive changes    Dementia (HCC)    Depression    Gout    Hypertension    Hypothyroidism    Peripheral neuropathy    History reviewed. No pertinent surgical history.  Allergies[1]  Outpatient Encounter Medications as of 04/14/2024  Medication Sig   acetaminophen  (TYLENOL ) 325 MG tablet  Take 650 mg by mouth with breakfast, with lunch, and with evening meal.   allopurinol  (ZYLOPRIM ) 100 MG tablet Take 100 mg by mouth daily.   aspirin  81 MG chewable tablet Chew 81 mg by mouth daily.   benazepril  (LOTENSIN ) 5 MG tablet Take 5 mg by mouth daily.   Calcium  Carb-Cholecalciferol  500-400 MG-UNIT TABS Take 2 tablets by mouth daily.    Cholecalciferol  (VITAMIN D ) 50 MCG (2000 UT) CAPS Take 2,000 Units by mouth daily.   cyanocobalamin  1000 MCG tablet Take 1 tablet (1,000 mcg total) by mouth daily.   Docusate Sodium  100 MG capsule Take 100 mg by mouth 2 (two) times daily.   ferrous sulfate 325 (65 FE) MG tablet Take 325 mg by mouth daily. Give 1 tablet by mouth one time a day for iron deficiency anemia   furosemide  (LASIX ) 20 MG tablet Take 20 mg by mouth See admin instructions. Take 20 mg by mouth at 9 AM on Mondays and Thursdays   gabapentin  (NEURONTIN ) 400 MG capsule Take 1 capsule (400 mg total) by mouth at bedtime.   hydrocortisone (ANUSOL-HC) 2.5 % rectal cream Place 1 Application rectally 2 (two) times daily as needed for hemorrhoids (APPLY TOPICALLY).   levothyroxine  (SYNTHROID ) 100 MCG tablet Take 100 mcg by mouth See admin instructions. Take 100 mcg by mouth at 6 AM on Wednesdays ONLY   levothyroxine  (SYNTHROID ) 50 MCG tablet Take 50 mcg by mouth See admin instructions. Take 50 mcg by mouth at 6 AM on Sun/Mon/Tues/Thurs/Fri/Sat   memantine  (NAMENDA ) 10 MG tablet Take 5 mg by mouth in the morning and at bedtime.   omeprazole  (PRILOSEC  OTC) 20 MG tablet Take 20 mg by mouth daily before breakfast.   potassium chloride  SA (KLOR-CON  M) 20 MEQ tablet Take 20 mEq by mouth See admin instructions. Take 20 mEq by mouth at 9 AM on Mondays and Thursdays   No facility-administered encounter medications on file as of 04/14/2024.    Review of Systems  Constitutional:  Negative for activity change, diaphoresis and fever.  HENT:  Positive for hearing loss. Negative for congestion.   Eyes:   Negative for visual disturbance.  Respiratory:  Negative for cough, shortness of breath and wheezing.        DOE   Cardiovascular:  Negative for leg swelling.  Gastrointestinal:  Negative for abdominal pain and constipation.  Genitourinary:  Negative for dysuria and urgency.  Musculoskeletal:  Positive for arthralgias, back pain and gait problem.       Chronic, aches.  Baseline walker with walker slowly with SBA  Skin:  Negative for color change.  Neurological:  Negative for  facial asymmetry, speech difficulty and weakness.  Psychiatric/Behavioral:  Positive for confusion. Negative for sleep disturbance. The patient is not nervous/anxious.     Immunization History  Administered Date(s) Administered   Fluad Quad(high Dose 65+) 01/01/2022   INFLUENZA, HIGH DOSE SEASONAL PF 12/11/2018, 01/01/2022, 12/10/2022, 12/15/2023   Influenza-Unspecified 03/24/2018, 12/21/2019, 12/26/2020   Moderna Covid-19 Vaccine  Bivalent Booster 36yrs & up 01/09/2022, 12/24/2022   Moderna SARS-COV2 Booster Vaccination 02/20/2021, 07/26/2021   Moderna Sars-Covid-2 Vaccination 03/12/2019, 04/09/2019, 04/21/2019, 01/17/2020, 08/07/2020   Pneumococcal Conjugate-13 09/09/2012, 10/28/2013   Pneumococcal Polysaccharide-23 04/02/2009, 04/16/2009   RSV,unspecified 12/04/2023   Tdap 07/15/2019   Unspecified SARS-COV-2 Vaccination 01/04/2024   Zoster Recombinant(Shingrix) 04/16/2009, 06/20/2022   Zoster, Live 04/16/2009   Zoster, Unspecified 04/16/2009, 04/08/2023   Pertinent  Health Maintenance Due  Topic Date Due   Influenza Vaccine  Completed   Bone Density Scan  Discontinued      01/07/2022    9:04 AM 02/06/2022   10:48 AM 04/21/2022    3:50 PM 06/12/2022   10:12 AM 06/12/2022   12:58 PM  Fall Risk  Falls in the past year? 0 0 0 0 1  Was there an injury with Fall? 0  0  0  0  0   Fall Risk Category Calculator 0 0 0 0 2  Fall Risk Category (Retired) Low  Low      (RETIRED) Patient Fall Risk Level Moderate  fall risk  Moderate fall risk      Patient at Risk for Falls Due to History of fall(s) History of fall(s) No Fall Risks No Fall Risks History of fall(s);Impaired balance/gait;Impaired mobility  Fall risk Follow up Falls evaluation completed  Falls evaluation completed  Falls evaluation completed Falls evaluation completed Falls evaluation completed;Education provided;Falls prevention discussed;Follow up appointment     Data saved with a previous flowsheet row definition   Functional Status Survey:    Vitals:   04/14/24 1429 04/14/24 1430  BP: (!) 151/78 (!) 162/85  Pulse: 78   Resp: 16   Temp: (!) 96.9 F (36.1 C)   SpO2: 95%   Weight: 168 lb 1.6 oz (76.2 kg)   Height: 5' 4 (1.626 m)    Body mass index is 28.85 kg/m. Physical Exam Vitals and nursing note reviewed.  Constitutional:      Comments: Appears tired  HENT:     Head: Normocephalic and atraumatic.     Nose: Nose normal.     Mouth/Throat:     Mouth: Mucous membranes are moist.  Eyes:     Extraocular Movements: Extraocular movements intact.     Conjunctiva/sclera: Conjunctivae normal.     Pupils: Pupils are equal, round, and reactive to light.  Cardiovascular:     Rate and Rhythm: Normal rate and regular rhythm.     Heart sounds: No murmur heard.    Comments: Weak dorsalis pedis pulses R+L from previous examination.  Pulmonary:     Effort: Pulmonary effort is normal.     Breath sounds: Rales present.     Comments: Bibasilar rales.  Abdominal:     General: Bowel sounds are normal.     Palpations: Abdomen is soft.     Tenderness: There is no abdominal tenderness.  Genitourinary:    Comments: Multiple external hemorrhoids from previous examination Musculoskeletal:        General: No tenderness.     Cervical back: Normal range of motion and neck supple.     Right lower leg: No edema.  Left lower leg: No edema.  Skin:    General: Skin is warm and dry.  Neurological:     General: No focal deficit present.      Mental Status: She is alert. Mental status is at baseline.     Gait: Gait abnormal.     Comments: Ambulates with walker is her baseline Hx of resolved Right facial, RUE, RLE weakness  Psychiatric:        Mood and Affect: Mood normal.        Behavior: Behavior normal.     Labs reviewed: Recent Labs    07/27/23 1401 10/05/23 0000 02/24/24 1718 02/25/24 1220  NA 141 139 142 142  K 3.6 4.6 4.2 4.9  CL 104 1* 106 107  CO2 27 27* 26 27  GLUCOSE 96  --  112* 100*  BUN 18 33* 27* 25*  CREATININE 1.48* 1.4* 1.28* 1.26*  CALCIUM  9.1 9.7 9.5 9.5  MG  --   --   --  2.2  PHOS  --   --   --  3.9   Recent Labs    07/27/23 1401 10/05/23 0000 02/24/24 1718  AST 17 14 18   ALT 11 9 8   ALKPHOS 69 75 123  BILITOT 0.5  --  0.3  PROT 6.4*  --  6.9  ALBUMIN 3.2* 4.3 3.8   Recent Labs    07/27/23 1401 10/05/23 0000 02/24/24 1718 02/25/24 1220 03/15/24 0000 04/14/24 0000  WBC 9.8   < > 8.8 10.7* 8.6 7.1  NEUTROABS 7.8*   < > 6.6  --  6,175.00 4,629.00  HGB 12.3   < > 9.9* 9.7* 9.5* 10.3*  HCT 39.2   < > 32.3* 30.5* 31* 32*  MCV 102.1*  --  100.9* 97.8  --   --   PLT 202   < > 196 220 240 210   < > = values in this interval not displayed.   Lab Results  Component Value Date   TSH 2.570 02/25/2024   No results found for: HGBA1C No results found for: CHOL, HDL, LDLCALC, LDLDIRECT, TRIG, CHOLHDL  Significant Diagnostic Results in last 30 days:  No results found.  Assessment/Plan HTN (hypertension)  loose blood pressure control, on Benazepril , ASA  CKD (chronic kidney disease) stage 3, GFR 30-59 ml/min (HCC) Bun/creat 25/1.26 02/25/24  Gout stable, on Allopurinol     Hypothyroidism on Levothyroxine , TSH 2.57 02/25/24  Major neurocognitive disorder (HCC) Mild chronic microvascular ischemic disease with a few scattered remote infarcts involving the left parietal and bilateral occipital Lobes, tolerated Memantine  well. 06/22/22 MMSE 17/30  History of  stroke Hx of CVA per CT head 08/02/20 Remote infarcts within the a occipital cortices bilaterally again noted. Interval development of remote infarcts within the left parietal lobe and periventricular white matte. On ASA.  07/27/23. CT head/MR brain: No acute intracranial abnormality  Generalized osteoarthritis of multiple sites Ambulates with walker slowly Taking Tylenol , Gabapentin  for pain.   Seizure-like activity (HCC) Seizure like activity, hx of, likely post fall, off Tramadol , 07/27/23   Congestive heart failure (CHF) (HCC) Euvolemic CHF, 09/30/21 CXR mild congestive heart failure, CXR 10/25/21 showed CHF. Takes Furosemide  20mg  x2/wk, compensated clinically. Echo  EF 60%, DOE/cough on and off. 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      Family/ staff Communication: plan of care reviewed with the patient and charge nurse.   Labs/tests  ordered:  none        [1]  Allergies Allergen Reactions   Actonel [Risedronate] Other (See Comments)    Allergic, per [facility]   Hydrochlorothiazide Other (See Comments)    Allergic, per [facility]   Lipitor [Atorvastatin] Other (See Comments)    Allergic, per [facility]   Neomycin Other (See Comments)    Allergic, per [facility]   Polysporin [Bacitracin-Polymyxin B] Other (See Comments)    Allergic, per [facility]   Prolia  [Denosumab ] Other (See Comments)    Allergic, per [facility]   Sulfa Antibiotics Other (See Comments)    Allergic, per [facility]   Zocor [Simvastatin] Other (See Comments)    Allergic, per [facility]   "

## 2024-04-15 ENCOUNTER — Encounter: Payer: Self-pay | Admitting: Nurse Practitioner
